# Patient Record
Sex: Female | Born: 1965 | Race: Black or African American | Hispanic: No | Smoking: Never smoker
Health system: Southern US, Community
[De-identification: ages and names within clinical notes are randomized; demographics above are authoritative.]

## PROBLEM LIST (undated history)

## (undated) DIAGNOSIS — G43009 Migraine without aura, not intractable, without status migrainosus: Secondary | ICD-10-CM

## (undated) DIAGNOSIS — F329 Major depressive disorder, single episode, unspecified: Secondary | ICD-10-CM

## (undated) DIAGNOSIS — F988 Other specified behavioral and emotional disorders with onset usually occurring in childhood and adolescence: Secondary | ICD-10-CM

## (undated) DIAGNOSIS — I639 Cerebral infarction, unspecified: Secondary | ICD-10-CM

## (undated) DIAGNOSIS — K219 Gastro-esophageal reflux disease without esophagitis: Secondary | ICD-10-CM

## (undated) DIAGNOSIS — G43909 Migraine, unspecified, not intractable, without status migrainosus: Secondary | ICD-10-CM

## (undated) DIAGNOSIS — F32A Depression, unspecified: Secondary | ICD-10-CM

## (undated) DIAGNOSIS — R51 Headache: Secondary | ICD-10-CM

## (undated) DIAGNOSIS — R0600 Dyspnea, unspecified: Secondary | ICD-10-CM

## (undated) DIAGNOSIS — E876 Hypokalemia: Secondary | ICD-10-CM

## (undated) DIAGNOSIS — K5900 Constipation, unspecified: Secondary | ICD-10-CM

## (undated) DIAGNOSIS — J45909 Unspecified asthma, uncomplicated: Secondary | ICD-10-CM

## (undated) DIAGNOSIS — M1990 Unspecified osteoarthritis, unspecified site: Secondary | ICD-10-CM

## (undated) DIAGNOSIS — I509 Heart failure, unspecified: Secondary | ICD-10-CM

## (undated) DIAGNOSIS — K589 Irritable bowel syndrome without diarrhea: Secondary | ICD-10-CM

## (undated) DIAGNOSIS — R600 Localized edema: Secondary | ICD-10-CM

## (undated) DIAGNOSIS — R1310 Dysphagia, unspecified: Secondary | ICD-10-CM

## (undated) DIAGNOSIS — K760 Fatty (change of) liver, not elsewhere classified: Secondary | ICD-10-CM

## (undated) DIAGNOSIS — F419 Anxiety disorder, unspecified: Secondary | ICD-10-CM

## (undated) DIAGNOSIS — M2550 Pain in unspecified joint: Secondary | ICD-10-CM

## (undated) DIAGNOSIS — Z973 Presence of spectacles and contact lenses: Secondary | ICD-10-CM

## (undated) DIAGNOSIS — I10 Essential (primary) hypertension: Secondary | ICD-10-CM

## (undated) DIAGNOSIS — R519 Headache, unspecified: Secondary | ICD-10-CM

## (undated) DIAGNOSIS — G4730 Sleep apnea, unspecified: Secondary | ICD-10-CM

## (undated) DIAGNOSIS — E0789 Other specified disorders of thyroid: Secondary | ICD-10-CM

## (undated) DIAGNOSIS — Z972 Presence of dental prosthetic device (complete) (partial): Secondary | ICD-10-CM

## (undated) DIAGNOSIS — R633 Feeding difficulties: Secondary | ICD-10-CM

## (undated) DIAGNOSIS — E559 Vitamin D deficiency, unspecified: Secondary | ICD-10-CM

## (undated) DIAGNOSIS — E041 Nontoxic single thyroid nodule: Secondary | ICD-10-CM

## (undated) DIAGNOSIS — E119 Type 2 diabetes mellitus without complications: Secondary | ICD-10-CM

## (undated) DIAGNOSIS — Z9109 Other allergy status, other than to drugs and biological substances: Secondary | ICD-10-CM

## (undated) DIAGNOSIS — N189 Chronic kidney disease, unspecified: Secondary | ICD-10-CM

## (undated) DIAGNOSIS — R5382 Chronic fatigue, unspecified: Secondary | ICD-10-CM

## (undated) DIAGNOSIS — T753XXA Motion sickness, initial encounter: Secondary | ICD-10-CM

## (undated) DIAGNOSIS — G9332 Myalgic encephalomyelitis/chronic fatigue syndrome: Secondary | ICD-10-CM

## (undated) DIAGNOSIS — K0889 Other specified disorders of teeth and supporting structures: Secondary | ICD-10-CM

## (undated) DIAGNOSIS — E039 Hypothyroidism, unspecified: Secondary | ICD-10-CM

## (undated) DIAGNOSIS — M797 Fibromyalgia: Secondary | ICD-10-CM

## (undated) HISTORY — PX: ABDOMINAL HYSTERECTOMY: SHX81

## (undated) HISTORY — DX: Pain in unspecified joint: M25.50

## (undated) HISTORY — DX: Anxiety disorder, unspecified: F41.9

## (undated) HISTORY — DX: Vitamin D deficiency, unspecified: E55.9

## (undated) HISTORY — DX: Chronic fatigue, unspecified: R53.82

## (undated) HISTORY — DX: Unspecified osteoarthritis, unspecified site: M19.90

## (undated) HISTORY — DX: Other specified behavioral and emotional disorders with onset usually occurring in childhood and adolescence: F98.8

## (undated) HISTORY — DX: Fatty (change of) liver, not elsewhere classified: K76.0

## (undated) HISTORY — DX: Myalgic encephalomyelitis/chronic fatigue syndrome: G93.32

## (undated) HISTORY — DX: Irritable bowel syndrome, unspecified: K58.9

## (undated) HISTORY — DX: Hypothyroidism, unspecified: E03.9

## (undated) HISTORY — DX: Other specified disorders of thyroid: E07.89

## (undated) HISTORY — DX: Dyspnea, unspecified: R06.00

## (undated) HISTORY — PX: ABDOMINAL SURGERY: SHX537

## (undated) HISTORY — PX: ECTOPIC PREGNANCY SURGERY: SHX613

## (undated) HISTORY — DX: Other specified disorders of teeth and supporting structures: K08.89

## (undated) HISTORY — PX: HERNIA REPAIR: SHX51

## (undated) HISTORY — DX: Heart failure, unspecified: I50.9

## (undated) HISTORY — DX: Migraine, unspecified, not intractable, without status migrainosus: G43.909

## (undated) HISTORY — DX: Dysphagia, unspecified: R13.10

## (undated) HISTORY — DX: Constipation, unspecified: K59.00

## (undated) HISTORY — DX: Depression, unspecified: F32.A

## (undated) HISTORY — PX: TUBAL LIGATION: SHX77

## (undated) HISTORY — DX: Localized edema: R60.0

---
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
## 1984-09-02 HISTORY — PX: APPENDECTOMY: SHX54

## 2003-01-08 ENCOUNTER — Encounter: Admission: RE | Admit: 2003-01-08 | Discharge: 2003-01-08 | Payer: Self-pay | Admitting: Sports Medicine

## 2003-01-08 ENCOUNTER — Encounter: Payer: Self-pay | Admitting: Sports Medicine

## 2004-01-10 ENCOUNTER — Other Ambulatory Visit: Payer: Self-pay

## 2004-06-02 ENCOUNTER — Encounter: Payer: Self-pay | Admitting: Pain Medicine

## 2004-06-06 ENCOUNTER — Ambulatory Visit: Payer: Self-pay | Admitting: Pain Medicine

## 2004-06-20 ENCOUNTER — Ambulatory Visit: Payer: Self-pay | Admitting: Pain Medicine

## 2004-06-21 ENCOUNTER — Emergency Department: Payer: Self-pay | Admitting: Emergency Medicine

## 2004-07-12 ENCOUNTER — Ambulatory Visit: Payer: Self-pay | Admitting: Pain Medicine

## 2004-08-24 ENCOUNTER — Other Ambulatory Visit: Payer: Self-pay

## 2004-08-24 ENCOUNTER — Inpatient Hospital Stay: Payer: Self-pay | Admitting: Internal Medicine

## 2004-08-25 ENCOUNTER — Other Ambulatory Visit: Payer: Self-pay

## 2004-09-02 HISTORY — PX: KNEE ARTHROSCOPY: SHX127

## 2004-09-02 HISTORY — PX: ROTATOR CUFF REPAIR: SHX139

## 2004-09-13 ENCOUNTER — Ambulatory Visit: Payer: Self-pay | Admitting: Pain Medicine

## 2004-09-18 ENCOUNTER — Emergency Department: Payer: Self-pay | Admitting: Emergency Medicine

## 2004-09-19 ENCOUNTER — Encounter: Payer: Self-pay | Admitting: Pain Medicine

## 2004-10-03 ENCOUNTER — Encounter: Payer: Self-pay | Admitting: Pain Medicine

## 2004-10-11 ENCOUNTER — Ambulatory Visit: Payer: Self-pay | Admitting: Pain Medicine

## 2005-08-19 ENCOUNTER — Emergency Department: Payer: Self-pay | Admitting: Emergency Medicine

## 2005-08-19 ENCOUNTER — Other Ambulatory Visit: Payer: Self-pay

## 2006-01-04 ENCOUNTER — Emergency Department: Payer: Self-pay | Admitting: Internal Medicine

## 2006-04-09 ENCOUNTER — Emergency Department: Payer: Self-pay | Admitting: Emergency Medicine

## 2006-04-13 ENCOUNTER — Emergency Department: Payer: Self-pay | Admitting: Emergency Medicine

## 2008-08-01 ENCOUNTER — Emergency Department: Payer: Self-pay | Admitting: Emergency Medicine

## 2008-08-10 DIAGNOSIS — G43009 Migraine without aura, not intractable, without status migrainosus: Secondary | ICD-10-CM | POA: Insufficient documentation

## 2008-08-10 DIAGNOSIS — E785 Hyperlipidemia, unspecified: Secondary | ICD-10-CM | POA: Insufficient documentation

## 2008-10-26 ENCOUNTER — Emergency Department: Payer: Self-pay | Admitting: Emergency Medicine

## 2009-05-05 ENCOUNTER — Emergency Department: Payer: Self-pay | Admitting: Emergency Medicine

## 2009-05-22 ENCOUNTER — Encounter: Payer: Self-pay | Admitting: Family Medicine

## 2009-05-30 DIAGNOSIS — G4730 Sleep apnea, unspecified: Secondary | ICD-10-CM | POA: Insufficient documentation

## 2009-06-02 ENCOUNTER — Encounter: Payer: Self-pay | Admitting: Family Medicine

## 2009-07-17 ENCOUNTER — Emergency Department: Payer: Self-pay | Admitting: Emergency Medicine

## 2009-08-22 DIAGNOSIS — F329 Major depressive disorder, single episode, unspecified: Secondary | ICD-10-CM | POA: Insufficient documentation

## 2009-10-04 ENCOUNTER — Emergency Department: Payer: Self-pay | Admitting: Emergency Medicine

## 2009-12-29 ENCOUNTER — Emergency Department: Payer: Self-pay | Admitting: Emergency Medicine

## 2010-01-09 ENCOUNTER — Ambulatory Visit: Payer: Self-pay | Admitting: Family Medicine

## 2010-06-01 ENCOUNTER — Emergency Department: Payer: Self-pay | Admitting: Emergency Medicine

## 2010-09-06 ENCOUNTER — Emergency Department: Payer: Self-pay | Admitting: Emergency Medicine

## 2010-10-05 ENCOUNTER — Inpatient Hospital Stay: Payer: Self-pay | Admitting: Internal Medicine

## 2010-12-28 ENCOUNTER — Emergency Department: Payer: Self-pay | Admitting: Emergency Medicine

## 2011-01-21 ENCOUNTER — Emergency Department: Payer: Self-pay | Admitting: Internal Medicine

## 2011-01-22 ENCOUNTER — Ambulatory Visit: Payer: Self-pay | Admitting: Family Medicine

## 2011-02-13 ENCOUNTER — Encounter: Payer: Self-pay | Admitting: Family Medicine

## 2011-03-03 ENCOUNTER — Encounter: Payer: Self-pay | Admitting: Family Medicine

## 2011-04-03 ENCOUNTER — Encounter: Payer: Self-pay | Admitting: Family Medicine

## 2011-05-31 ENCOUNTER — Emergency Department: Payer: Self-pay | Admitting: *Deleted

## 2011-07-11 ENCOUNTER — Emergency Department: Payer: Self-pay | Admitting: *Deleted

## 2011-11-12 ENCOUNTER — Emergency Department: Payer: Self-pay | Admitting: Unknown Physician Specialty

## 2011-11-12 LAB — URINALYSIS, COMPLETE
Bilirubin,UR: NEGATIVE
Blood: NEGATIVE
Glucose,UR: NEGATIVE mg/dL (ref 0–75)
Ketone: NEGATIVE
Nitrite: NEGATIVE
Ph: 7 (ref 4.5–8.0)
Protein: NEGATIVE
RBC,UR: 1 /HPF (ref 0–5)
Specific Gravity: 1.01 (ref 1.003–1.030)
Squamous Epithelial: 4
WBC UR: 4 /HPF (ref 0–5)

## 2011-11-12 LAB — CBC
HCT: 33.5 % — ABNORMAL LOW (ref 35.0–47.0)
HGB: 11.4 g/dL — ABNORMAL LOW (ref 12.0–16.0)
MCH: 30.8 pg (ref 26.0–34.0)
MCHC: 33.9 g/dL (ref 32.0–36.0)
MCV: 91 fL (ref 80–100)
Platelet: 262 10*3/uL (ref 150–440)
RBC: 3.69 10*6/uL — ABNORMAL LOW (ref 3.80–5.20)
RDW: 14.1 % (ref 11.5–14.5)
WBC: 6.3 10*3/uL (ref 3.6–11.0)

## 2011-11-12 LAB — DRUG SCREEN, URINE

## 2011-11-12 LAB — COMPREHENSIVE METABOLIC PANEL
Albumin: 3.2 g/dL — ABNORMAL LOW (ref 3.4–5.0)
Alkaline Phosphatase: 62 U/L (ref 50–136)
Anion Gap: 10 (ref 7–16)
BUN: 14 mg/dL (ref 7–18)
Bilirubin,Total: 0.2 mg/dL (ref 0.2–1.0)
Calcium, Total: 8.5 mg/dL (ref 8.5–10.1)
Chloride: 101 mmol/L (ref 98–107)
Co2: 26 mmol/L (ref 21–32)
Creatinine: 0.71 mg/dL (ref 0.60–1.30)
EGFR (African American): >60
EGFR (Non-African Amer.): >60
Glucose: 151 mg/dL — ABNORMAL HIGH (ref 65–99)
Osmolality: 277 (ref 275–301)
Potassium: 4.9 mmol/L (ref 3.5–5.1)
SGOT(AST): 31 U/L (ref 15–37)
SGPT (ALT): 28 U/L
Sodium: 137 mmol/L (ref 136–145)
Total Protein: 6.9 g/dL (ref 6.4–8.2)

## 2011-11-12 LAB — CK TOTAL AND CKMB (NOT AT ARMC)
CK, Total: 99 U/L (ref 21–215)
CK-MB: <0.5 ng/mL — ABNORMAL LOW (ref 0.5–3.6)

## 2011-11-12 LAB — TROPONIN I: Troponin-I: <0.02 ng/mL

## 2011-11-12 LAB — MAGNESIUM: Magnesium: 1.6 mg/dL — ABNORMAL LOW

## 2012-06-22 DIAGNOSIS — K219 Gastro-esophageal reflux disease without esophagitis: Secondary | ICD-10-CM | POA: Insufficient documentation

## 2012-10-03 ENCOUNTER — Emergency Department: Payer: Self-pay | Admitting: Emergency Medicine

## 2012-10-28 ENCOUNTER — Ambulatory Visit: Payer: Self-pay | Admitting: Orthopedic Surgery

## 2012-12-11 ENCOUNTER — Ambulatory Visit: Payer: Self-pay | Admitting: Unknown Physician Specialty

## 2012-12-16 HISTORY — PX: JOINT REPLACEMENT: SHX530

## 2013-04-30 ENCOUNTER — Emergency Department: Payer: Self-pay | Admitting: Emergency Medicine

## 2013-07-01 DIAGNOSIS — R251 Tremor, unspecified: Secondary | ICD-10-CM | POA: Insufficient documentation

## 2014-05-06 ENCOUNTER — Ambulatory Visit: Payer: Self-pay | Admitting: Family Medicine

## 2014-08-26 ENCOUNTER — Emergency Department: Payer: Self-pay | Admitting: Emergency Medicine

## 2014-09-29 ENCOUNTER — Ambulatory Visit: Payer: Self-pay | Admitting: Anesthesiology

## 2014-09-29 ENCOUNTER — Emergency Department: Payer: Self-pay | Admitting: Emergency Medicine

## 2014-09-29 ENCOUNTER — Ambulatory Visit: Payer: Self-pay | Admitting: Family Medicine

## 2014-10-04 DIAGNOSIS — M25571 Pain in right ankle and joints of right foot: Secondary | ICD-10-CM | POA: Insufficient documentation

## 2014-10-26 ENCOUNTER — Ambulatory Visit: Payer: Self-pay | Admitting: Unknown Physician Specialty

## 2014-11-15 ENCOUNTER — Emergency Department: Payer: Self-pay | Admitting: Emergency Medicine

## 2015-05-28 ENCOUNTER — Emergency Department
Admission: EM | Admit: 2015-05-28 | Discharge: 2015-05-28 | Disposition: A | Discharge status: Home / Self Care | Payer: Medicare Other | Attending: Emergency Medicine | Admitting: Emergency Medicine

## 2015-05-28 ENCOUNTER — Emergency Department: Payer: Medicare Other

## 2015-05-28 ENCOUNTER — Encounter: Payer: Self-pay | Admitting: *Deleted

## 2015-05-28 DIAGNOSIS — Y998 Other external cause status: Secondary | ICD-10-CM | POA: Insufficient documentation

## 2015-05-28 DIAGNOSIS — S8991XA Unspecified injury of right lower leg, initial encounter: Secondary | ICD-10-CM | POA: Insufficient documentation

## 2015-05-28 DIAGNOSIS — Y92511 Restaurant or cafe as the place of occurrence of the external cause: Secondary | ICD-10-CM | POA: Insufficient documentation

## 2015-05-28 DIAGNOSIS — Y9389 Activity, other specified: Secondary | ICD-10-CM | POA: Insufficient documentation

## 2015-05-28 DIAGNOSIS — S79921A Unspecified injury of right thigh, initial encounter: Secondary | ICD-10-CM | POA: Insufficient documentation

## 2015-05-28 DIAGNOSIS — W1839XA Other fall on same level, initial encounter: Secondary | ICD-10-CM | POA: Diagnosis not present

## 2015-05-28 DIAGNOSIS — Z880 Allergy status to penicillin: Secondary | ICD-10-CM | POA: Insufficient documentation

## 2015-05-28 DIAGNOSIS — E119 Type 2 diabetes mellitus without complications: Secondary | ICD-10-CM | POA: Insufficient documentation

## 2015-05-28 DIAGNOSIS — W19XXXA Unspecified fall, initial encounter: Secondary | ICD-10-CM

## 2015-05-28 DIAGNOSIS — M25561 Pain in right knee: Secondary | ICD-10-CM

## 2015-05-28 DIAGNOSIS — R1110 Vomiting, unspecified: Secondary | ICD-10-CM | POA: Insufficient documentation

## 2015-05-28 DIAGNOSIS — M25461 Effusion, right knee: Secondary | ICD-10-CM

## 2015-05-28 DIAGNOSIS — I10 Essential (primary) hypertension: Secondary | ICD-10-CM | POA: Diagnosis not present

## 2015-05-28 HISTORY — DX: Essential (primary) hypertension: I10

## 2015-05-28 HISTORY — DX: Type 2 diabetes mellitus without complications: E11.9

## 2015-05-28 MED ORDER — ONDANSETRON HCL 4 MG PO TABS: 4.0000 mg | ORAL_TABLET | Freq: Three times a day (TID) | ORAL | Status: DC | PRN

## 2015-05-28 MED ORDER — KETOROLAC TROMETHAMINE 30 MG/ML IJ SOLN
60.0000 mg | Freq: Once | INTRAMUSCULAR | Status: AC
  Administered 2015-05-28: 60 mg via INTRAMUSCULAR
  Filled 2015-05-28: qty 2

## 2015-05-28 MED ORDER — OXYCODONE-ACETAMINOPHEN 5-325 MG PO TABS
1.0000 | ORAL_TABLET | Freq: Once | ORAL | Status: AC
  Administered 2015-05-28: 1 via ORAL
  Filled 2015-05-28: qty 1

## 2015-05-28 MED ORDER — ONDANSETRON 4 MG PO TBDP
4.0000 mg | ORAL_TABLET | Freq: Once | ORAL | Status: AC
  Administered 2015-05-28: 4 mg via ORAL
  Filled 2015-05-28: qty 1

## 2015-05-28 MED ORDER — OXYCODONE-ACETAMINOPHEN 5-325 MG PO TABS: 1.0000 | ORAL_TABLET | ORAL | Status: DC | PRN

## 2015-05-28 MED ORDER — IBUPROFEN 800 MG PO TABS: 800.0000 mg | ORAL_TABLET | Freq: Three times a day (TID) | ORAL | Status: DC | PRN

## 2015-05-28 NOTE — ED Provider Notes (Signed)
[facility] Emergency Department Provider Note  ____________________________________________  Time seen: Approximately 4:43 AM  I have reviewed the triage vital signs and the nursing notes.   HISTORY  Chief Complaint Knee Injury    HPI Amanda [name] is a 49 y.o. female who presents to the ED from home s/p mechanical fall at a restaurant last evening approximately 7pm. 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 Patient fell directly onto her right knee. Denies striking head or LOC. Complains of aching and sharp pain throughout her right knee and thigh. States she vomited secondary to pain. Unable to bear weight on her right leg. Denies headache, neck pain, fever, chills, chest pain, shortness of breath, diarrhea, weakness.Nothing makes the pain better. Movement makes the pain worse.   Past Medical History  Diagnosis Date  . Diabetes mellitus without complication   . Hypertension     There are no active problems to display for this patient.   Past Surgical History  Procedure Laterality Date  . Joint replacement Right   . Abdominal surgery    . Abdominal hysterectomy    . Ectopic pregnancy surgery      No current outpatient prescriptions on file.  Allergies Penicillins and Ace inhibitors  No family history on file.  Social History Social History  Substance Use Topics  . Smoking status: Never Smoker   . Smokeless tobacco: Never Used  . Alcohol Use: No    Review of Systems Constitutional: No fever/chills Eyes: No visual changes. ENT: No sore throat. Cardiovascular: Denies chest pain. Respiratory: Denies shortness of breath. Gastrointestinal: No abdominal pain.  No nausea, no vomiting.  No diarrhea.  No constipation. Genitourinary: Negative for dysuria. Musculoskeletal: Positive for right knee pain and swelling. Negative for back pain. Skin: Negative for rash. Neurological: Negative for headaches, focal  weakness or numbness.  10-point ROS otherwise negative.  ____________________________________________   PHYSICAL EXAM:  VITAL SIGNS: ED Triage Vitals  Enc Vitals Group     BP 05/28/15 0056 143/93 mmHg     Pulse Rate 05/28/15 0056 65     Resp 05/28/15 0056 18     Temp 05/28/15 0056 98.2 F (36.8 C)     Temp Source 05/28/15 0056 Oral     SpO2 05/28/15 0056 94 %     Weight 05/28/15 0056 188 lb (85.276 kg)     Height 05/28/15 0056  (1.651 m)     Head Cir --      Peak Flow --      Pain Score 05/28/15 0057 8     Pain Loc --      Pain Edu? --      Excl. in GC? --     Constitutional: Alert and oriented. Well appearing and in no acute distress. Eyes: Conjunctivae are normal. PERRL. EOMI. Head: Atraumatic. Nose: No congestion/rhinnorhea. Mouth/Throat: Mucous membranes are moist.  Oropharynx non-erythematous. Neck: No stridor. No cervical spine tenderness to palpation. Cardiovascular: Normal rate, regular rhythm. Grossly normal heart sounds.  Good peripheral circulation. Respiratory: Normal respiratory effort.  No retractions. Lungs CTAB. Gastrointestinal: Soft and nontender. No distention. No abdominal bruits. No CVA tenderness. Musculoskeletal: Right knee moderately swollen, tender to palpation. Limited range of motion secondary to pain. 2+ distal pulses. Calf is supple without evidence for compartment syndrome. Limb is symmetrically warm without evidence for ischemia. Neurologic:  Normal speech and language. No gross focal neurologic deficits are appreciated.  Skin:  Skin is warm, dry  and intact. No rash noted. Psychiatric: Mood and affect are normal. Speech and behavior are normal.  ____________________________________________   LABS (all labs ordered are listed, but only abnormal results are displayed)  Labs Reviewed - No data to display ____________________________________________  EKG  None ____________________________________________  RADIOLOGY  Right knee  x-rays (viewed by me, interpreted per Dr. Andria Meuse): Right medial compartment partial knee arthroplasty. Components appear well seated. No acute bony abnormalities.  ____________________________________________   PROCEDURES  Procedure(s) performed: None  Critical Care performed: No  ____________________________________________   INITIAL IMPRESSION / ASSESSMENT AND PLAN / ED COURSE  Pertinent labs & imaging results that were available during my care of the patient were reviewed by me and considered in my medical decision making (see chart for details).  49 year old female s/p mechanical fall onto right knee. X-rays negative for dislocation or fracture. Will treat with NSAID's, analgesia, knee immobilizer, crutches and orthopedics follow-up. Strict return precautions given. Patient verbalizes understanding and agrees with plan of care. ____________________________________________   FINAL CLINICAL IMPRESSION(S) / ED DIAGNOSES  Final diagnoses:  Fall, initial encounter  Knee pain, acute, right  Knee swelling, right      Irean Hong, MD 05/28/15 [phone]

## 2015-05-28 NOTE — ED Notes (Signed)
Pt presents w/ c/o R knee pain, swelling, and decreased mobility after a fall at a restaurant last night. Pt states her entire R thigh and R knee are hurting. Pt states she has been vomiting and believes it is secondary to the pain from her knee. Pt states she is unable to bear weight on R leg.

## 2015-05-28 NOTE — Discharge Instructions (Signed)
1. Take medicines as needed for pain and nausea (Motrin/Percocet/Zofran #15). 2. Wear knee immobilizer and use crutches to walk. 3. Elevate affected area and apply ice several times daily. 4. Return to the ER for worsening symptoms, increased swelling, leg weakness or other concerns.  Fall Prevention and Home Safety Falls cause injuries and can affect all age groups. It is possible to use preventive measures to significantly decrease the likelihood of falls. There are many simple measures which can make your home safer and prevent falls. OUTDOORS  Repair cracks and edges of walkways and driveways.  Remove high doorway thresholds.  Trim shrubbery on the main path into your home.  Have good outside lighting.  Clear walkways of tools, rocks, debris, and clutter.  Check that handrails are not broken and are securely fastened. Both sides of steps should have handrails.  Have leaves, snow, and ice cleared regularly.  Use sand or salt on walkways during winter months.  In the garage, clean up grease or oil spills. BATHROOM  Install night lights.  Install grab bars by the toilet and in the tub and shower.  Use non-skid mats or decals in the tub or shower.  Place a plastic non-slip stool in the shower to sit on, if needed.  Keep floors dry and clean up all water on the floor immediately.  Remove soap buildup in the tub or shower on a regular basis.  Secure bath mats with non-slip, double-sided rug tape.  Remove throw rugs and tripping hazards from the floors. BEDROOMS  Install night lights.  Make sure a bedside light is easy to reach.  Do not use oversized bedding.  Keep a telephone by your bedside.  Have a firm chair with side arms to use for getting dressed.  Remove throw rugs and tripping hazards from the floor. KITCHEN  Keep handles on pots and pans turned toward the center of the stove. Use back burners when possible.  Clean up spills quickly and allow time for  drying.  Avoid walking on wet floors.  Avoid hot utensils and knives.  Position shelves so they are not too high or low.  Place commonly used objects within easy reach.  If necessary, use a sturdy step stool with a grab bar when reaching.  Keep electrical cables out of the way.  Do not use floor polish or wax that makes floors slippery. If you must use wax, use non-skid floor wax.  Remove throw rugs and tripping hazards from the floor. STAIRWAYS  Never leave objects on stairs.  Place handrails on both sides of stairways and use them. Fix any loose handrails. Make sure handrails on both sides of the stairways are as long as the stairs.  Check carpeting to make sure it is firmly attached along stairs. Make repairs to worn or loose carpet promptly.  Avoid placing throw rugs at the top or bottom of stairways, or properly secure the rug with carpet tape to prevent slippage. Get rid of throw rugs, if possible.  Have an electrician put in a light switch at the top and bottom of the stairs. OTHER FALL PREVENTION TIPS  Wear low-heel or rubber-soled shoes that are supportive and fit well. Wear closed toe shoes.  When using a stepladder, make sure it is fully opened and both spreaders are firmly locked. Do not climb a closed stepladder.  Add color or contrast paint or tape to grab bars and handrails in your home. Place contrasting color strips on first and last steps.  Learn and use mobility aids as needed. Install an electrical emergency response system.  Turn on lights to avoid dark areas. Replace light bulbs that burn out immediately. Get light switches that glow.  Arrange furniture to create clear pathways. Keep furniture in the same place.  Firmly attach carpet with non-skid or double-sided tape.  Eliminate uneven floor surfaces.  Select a carpet pattern that does not visually hide the edge of steps.  Be aware of all pets. OTHER HOME SAFETY TIPS  Set the water temperature  for 120 F (48.8 C).  Keep emergency numbers on or near the telephone.  Keep smoke detectors on every level of the home and near sleeping areas. Document Released: 08/09/2002 Document Revised: 02/18/2012 Document Reviewed: 11/08/2011 [facility] Patient Information 2015 [place], [place]. This information is not intended to replace advice given to you by your health care provider. Make sure you discuss any questions you have with your health care provider.  Knee Pain The knee is the complex joint between your thigh and your lower leg. It is made up of bones, tendons, ligaments, and cartilage. The bones that make up the knee are:  The femur in the thigh.  The tibia and fibula in the lower leg.  The patella or kneecap riding in the groove on the lower femur. CAUSES  Knee pain is a common complaint with many causes. A few of these causes are:  Injury, such as:  A ruptured ligament or tendon injury.  Torn cartilage.  Medical conditions, such as:  Gout  Arthritis  Infections  Overuse, over training, or overdoing a physical activity. Knee pain can be minor or severe. Knee pain can accompany debilitating injury. Minor knee problems often respond well to self-care measures or get well on their own. More serious injuries may need medical intervention or even surgery. SYMPTOMS The knee is complex. Symptoms of knee problems can vary widely. Some of the problems are:  Pain with movement and weight bearing.  Swelling and tenderness.  Buckling of the knee.  Inability to straighten or extend your knee.  Your knee locks and you cannot straighten it.  Warmth and redness with pain and fever.  Deformity or dislocation of the kneecap. DIAGNOSIS  Determining what is wrong may be very straight forward such as when there is an injury. It can also be challenging because of the complexity of the knee. Tests to make a diagnosis may include:  Your caregiver taking a history and doing a  physical exam.  Routine X-rays can be used to rule out other problems. X-rays will not reveal a cartilage tear. Some injuries of the knee can be diagnosed by:  Arthroscopy a surgical technique by which a small video camera is inserted through tiny incisions on the sides of the knee. This procedure is used to examine and repair internal knee joint problems. Tiny instruments can be used during arthroscopy to repair the torn knee cartilage (meniscus).  Arthrography is a radiology technique. A contrast liquid is directly injected into the knee joint. Internal structures of the knee joint then become visible on X-ray film.  An MRI scan is a non X-ray radiology procedure in which magnetic fields and a computer produce two- or three-dimensional images of the inside of the knee. Cartilage tears are often visible using an MRI scanner. MRI scans have largely replaced arthrography in diagnosing cartilage tears of the knee.  Blood work.  Examination of the fluid that helps to lubricate the knee joint (synovial fluid). This is done  by taking a sample out using a needle and a syringe. TREATMENT The treatment of knee problems depends on the cause. Some of these treatments are:  Depending on the injury, proper casting, splinting, surgery, or physical therapy care will be needed.  Give yourself adequate recovery time. Do not overuse your joints. If you begin to get sore during workout routines, back off. Slow down or do fewer repetitions.  For repetitive activities such as cycling or running, maintain your strength and nutrition.  Alternate muscle groups. For example, if you are a weight lifter, work the upper body on one day and the lower body the next.  Either tight or weak muscles do not give the proper support for your knee. Tight or weak muscles do not absorb the stress placed on the knee joint. Keep the muscles surrounding the knee strong.  Take care of mechanical problems.  If you have flat feet,  orthotics or special shoes may help. See your caregiver if you need help.  Arch supports, sometimes with wedges on the inner or outer aspect of the heel, can help. These can shift pressure away from the side of the knee most bothered by osteoarthritis.  A brace called an "unloader" brace also may be used to help ease the pressure on the most arthritic side of the knee.  If your caregiver has prescribed crutches, braces, wraps or ice, use as directed. The acronym for this is PRICE. This means protection, rest, ice, compression, and elevation.  Nonsteroidal anti-inflammatory drugs (NSAIDs), can help relieve pain. But if taken immediately after an injury, they may actually increase swelling. Take NSAIDs with food in your stomach. Stop them if you develop stomach problems. Do not take these if you have a history of ulcers, stomach pain, or bleeding from the bowel. Do not take without your caregiver's approval if you have problems with fluid retention, heart failure, or kidney problems.  For ongoing knee problems, physical therapy may be helpful.  Glucosamine and chondroitin are over-the-counter dietary supplements. Both may help relieve the pain of osteoarthritis in the knee. These medicines are different from the usual anti-inflammatory drugs. Glucosamine may decrease the rate of cartilage destruction.  Injections of a corticosteroid drug into your knee joint may help reduce the symptoms of an arthritis flare-up. They may provide pain relief that lasts a few months. You may have to wait a few months between injections. The injections do have a small increased risk of infection, water retention, and elevated blood sugar levels.  Hyaluronic acid injected into damaged joints may ease pain and provide lubrication. These injections may work by reducing inflammation. A series of shots may give relief for as long as 6 months.  Topical painkillers. Applying certain ointments to your skin may help relieve the  pain and stiffness of osteoarthritis. Ask your pharmacist for suggestions. Many over the-counter products are approved for temporary relief of arthritis pain.  In some countries, doctors often prescribe topical NSAIDs for relief of chronic conditions such as arthritis and tendinitis. A review of treatment with NSAID creams found that they worked as well as oral medications but without the serious side effects. PREVENTION  Maintain a healthy weight. Extra pounds put more strain on your joints.  Get strong, stay limber. Weak muscles are a common cause of knee injuries. Stretching is important. Include flexibility exercises in your workouts.  Be smart about exercise. If you have osteoarthritis, chronic knee pain or recurring injuries, you may need to change the way you exercise.  This does not mean you have to stop being active. If your knees ache after jogging or playing basketball, consider switching to swimming, water aerobics, or other low-impact activities, at least for a few days a week. Sometimes limiting high-impact activities will provide relief.  Make sure your shoes fit well. Choose footwear that is right for your sport.  Protect your knees. Use the proper gear for knee-sensitive activities. Use kneepads when playing volleyball or laying carpet. Buckle your seat belt every time you drive. Most shattered kneecaps occur in car accidents.  Rest when you are tired. SEEK MEDICAL CARE IF:  You have knee pain that is continual and does not seem to be getting better.  SEEK IMMEDIATE MEDICAL CARE IF:  Your knee joint feels hot to the touch and you have a high fever. MAKE SURE YOU:   Understand these instructions.  Will watch your condition.  Will get help right away if you are not doing well or get worse. Document Released: 06/16/2007 Document Revised: 11/11/2011 Document Reviewed: 06/16/2007 [facility] Patient Information 2015 [place], [place]. This information is not intended to replace  advice given to you by your health care provider. Make sure you discuss any questions you have with your health care provider.  Knee Effusion  Knee effusion means you have fluid in your knee. The knee may be more difficult to bend and move. HOME CARE  Use crutches or a brace as told by your doctor.  Put ice on the injured area.  Put ice in a plastic bag.  Place a towel between your skin and the bag.  Leave the ice on for 15-20 minutes, 03-04 times a day.  Raise (elevate) your knee as much as possible.  Only take medicine as told by your doctor.  You may need to do strengthening exercises. Ask your doctor.  Continue with your normal diet and activities as told by your doctor. GET HELP RIGHT AWAY IF:  You have more puffiness (swelling) in your knee.  You see redness, puffiness, or have more pain in your knee.  You have a temperature by mouth above 102 F (38.9 C).  You get a rash.  You have trouble breathing.  You have a reaction to any medicine you are taking.  You have a lot of pain when you move your knee. MAKE SURE YOU:  Understand these instructions.  Will watch your condition.  Will get help right away if you are not doing well or get worse. Document Released: 09/21/2010 Document Revised: 11/11/2011 Document Reviewed: 09/21/2010 [facility] Patient Information 2015 [place], [place]. This information is not intended to replace advice given to you by your health care provider. Make sure you discuss any questions you have with your health care provider.

## 2015-06-30 ENCOUNTER — Other Ambulatory Visit: Payer: Self-pay | Admitting: Unknown Physician Specialty

## 2015-06-30 DIAGNOSIS — M25561 Pain in right knee: Secondary | ICD-10-CM

## 2015-07-01 ENCOUNTER — Ambulatory Visit
Admission: RE | Admit: 2015-07-01 | Discharge: 2015-07-01 | Disposition: A | Discharge status: Home / Self Care | Payer: Medicare Other | Source: Ambulatory Visit | Attending: Unknown Physician Specialty | Admitting: Unknown Physician Specialty

## 2015-07-01 DIAGNOSIS — Z9889 Other specified postprocedural states: Secondary | ICD-10-CM | POA: Diagnosis not present

## 2015-07-01 DIAGNOSIS — S8991XA Unspecified injury of right lower leg, initial encounter: Secondary | ICD-10-CM | POA: Insufficient documentation

## 2015-07-01 DIAGNOSIS — M1711 Unilateral primary osteoarthritis, right knee: Secondary | ICD-10-CM | POA: Diagnosis not present

## 2015-07-01 DIAGNOSIS — M25561 Pain in right knee: Secondary | ICD-10-CM

## 2015-07-01 DIAGNOSIS — W19XXXA Unspecified fall, initial encounter: Secondary | ICD-10-CM | POA: Diagnosis not present

## 2015-07-29 ENCOUNTER — Encounter: Payer: Self-pay | Admitting: Emergency Medicine

## 2015-07-29 ENCOUNTER — Emergency Department: Payer: Medicare Other

## 2015-07-29 DIAGNOSIS — Z880 Allergy status to penicillin: Secondary | ICD-10-CM | POA: Diagnosis not present

## 2015-07-29 DIAGNOSIS — J209 Acute bronchitis, unspecified: Secondary | ICD-10-CM | POA: Insufficient documentation

## 2015-07-29 DIAGNOSIS — E119 Type 2 diabetes mellitus without complications: Secondary | ICD-10-CM | POA: Insufficient documentation

## 2015-07-29 DIAGNOSIS — Z7984 Long term (current) use of oral hypoglycemic drugs: Secondary | ICD-10-CM | POA: Diagnosis not present

## 2015-07-29 DIAGNOSIS — Z794 Long term (current) use of insulin: Secondary | ICD-10-CM | POA: Diagnosis not present

## 2015-07-29 DIAGNOSIS — R062 Wheezing: Secondary | ICD-10-CM | POA: Diagnosis present

## 2015-07-29 DIAGNOSIS — I10 Essential (primary) hypertension: Secondary | ICD-10-CM | POA: Diagnosis not present

## 2015-07-29 DIAGNOSIS — Z79899 Other long term (current) drug therapy: Secondary | ICD-10-CM | POA: Diagnosis not present

## 2015-07-29 NOTE — ED Notes (Signed)
Pt states wheezing and coughing up green mucous. Pt complains of fever and chills. Pt states she called EMS and they gave her a breathing treatment at home.

## 2015-07-30 ENCOUNTER — Emergency Department
Admission: EM | Admit: 2015-07-30 | Discharge: 2015-07-30 | Disposition: A | Discharge status: Home / Self Care | Payer: Medicare Other | Attending: Emergency Medicine | Admitting: Emergency Medicine

## 2015-07-30 DIAGNOSIS — J40 Bronchitis, not specified as acute or chronic: Secondary | ICD-10-CM

## 2015-07-30 DIAGNOSIS — J209 Acute bronchitis, unspecified: Secondary | ICD-10-CM | POA: Diagnosis not present

## 2015-07-30 MED ORDER — HYDROCOD POLST-CPM POLST ER 10-8 MG/5ML PO SUER: 5.0000 mL | Freq: Two times a day (BID) | ORAL | Status: DC

## 2015-07-30 MED ORDER — ALBUTEROL SULFATE HFA 108 (90 BASE) MCG/ACT IN AERS: 2.0000 | INHALATION_SPRAY | RESPIRATORY_TRACT | Status: DC | PRN

## 2015-07-30 MED ORDER — ONDANSETRON 4 MG PO TBDP
4.0000 mg | ORAL_TABLET | Freq: Once | ORAL | Status: AC
  Administered 2015-07-30: 4 mg via ORAL

## 2015-07-30 MED ORDER — IPRATROPIUM-ALBUTEROL 0.5-2.5 (3) MG/3ML IN SOLN
3.0000 mL | Freq: Once | RESPIRATORY_TRACT | Status: AC
  Administered 2015-07-30: 3 mL via RESPIRATORY_TRACT
  Filled 2015-07-30: qty 3

## 2015-07-30 MED ORDER — AZITHROMYCIN 250 MG PO TABS: 250.0000 mg | ORAL_TABLET | Freq: Every day | ORAL | Status: DC

## 2015-07-30 MED ORDER — AZITHROMYCIN 250 MG PO TABS
500.0000 mg | ORAL_TABLET | Freq: Once | ORAL | Status: AC
  Administered 2015-07-30: 500 mg via ORAL
  Filled 2015-07-30: qty 2

## 2015-07-30 MED ORDER — ONDANSETRON HCL 4 MG PO TABS: 4.0000 mg | ORAL_TABLET | Freq: Three times a day (TID) | ORAL | Status: DC | PRN

## 2015-07-30 MED ORDER — HYDROCOD POLST-CPM POLST ER 10-8 MG/5ML PO SUER
5.0000 mL | Freq: Once | ORAL | Status: AC
  Administered 2015-07-30: 5 mL via ORAL
  Filled 2015-07-30: qty 5

## 2015-07-30 MED ORDER — ONDANSETRON 4 MG PO TBDP
ORAL_TABLET | ORAL | Status: AC
  Administered 2015-07-30: 4 mg via ORAL
  Filled 2015-07-30: qty 1

## 2015-07-30 MED ORDER — PREDNISONE 20 MG PO TABS
60.0000 mg | ORAL_TABLET | Freq: Once | ORAL | Status: AC
  Administered 2015-07-30: 60 mg via ORAL
  Filled 2015-07-30: qty 3

## 2015-07-30 MED ORDER — PREDNISONE 20 MG PO TABS: ORAL_TABLET | ORAL | Status: DC

## 2015-07-30 NOTE — ED Notes (Signed)
Pt reports cough x 4 days and fever.  Pt reports fever up to 101 but taking tylenol consistently q3-4hr, last dose 4hr ago.  Pt reports cough productive with green sputum.  Pt reports chest and back pain, described as sore, aching, and occasionally sharp.  Pt NAD at this time, respirations equal and unlabored, skin warm and dry.

## 2015-07-30 NOTE — Discharge Instructions (Signed)
1. Finish antibiotic as prescribed (azithromycin 250 mg daily 4 days). 2. Use albuterol inhaler 2 puffs every 4 hours as needed for wheezing. 3. Finish steroid as prescribed (prednisone 60 mg daily 4 days). This will temporarily increase your blood sugar but right now your lungs needed to reduce inflammation which is causing your wheezing. 4. Take cough medicine as needed (Tussionex). 5. Return to the ER for worsening symptoms, persistent vomiting, difficulty breathing or other concerns.

## 2015-07-30 NOTE — ED Provider Notes (Signed)
[facility] Emergency Department Provider Note  ____________________________________________  Time seen: Approximately 2:09 AM  I have reviewed the triage vital signs and the nursing notes.   HISTORY  Chief Complaint Wheezing and Fever    HPI Amanda [name] is a 49 y.o. female who presents to the ED from home with a chief complaint of cough, fever and myalgias.Patient reports cough productive of green sputum 4 days. Reports associated fever up to 101F which is improved with Tylenol. Describes aching muscles, chest and back. History of mild, intermittent asthma who has not had a nebulizer machine for the past 3 years. Denies recent travel or trauma. Denies abdominal pain, nausea, vomiting, diarrhea. The makes her symptoms worse. Nebulizer treatment given per EMS made her symptoms better.   Past Medical History  Diagnosis Date  . Diabetes mellitus without complication (HCC)   . Hypertension     There are no active problems to display for this patient.   Past Surgical History  Procedure Laterality Date  . Joint replacement Right   . Abdominal surgery    . Abdominal hysterectomy    . Ectopic pregnancy surgery      Current Outpatient Rx  Name  Route  Sig  Dispense  Refill  . canagliflozin (INVOKANA) 300 MG TABS tablet   Oral   Take 300 mg by mouth daily before breakfast.         . insulin detemir (LEVEMIR) 100 UNIT/ML injection   Subcutaneous   Inject 10 Units into the skin at bedtime.         . metFORMIN (GLUCOPHAGE) 500 MG tablet   Oral   Take by mouth 2 (two) times daily with a meal.         . pregabalin (LYRICA) 200 MG capsule   Oral   Take 200 mg by mouth 2 (two) times daily.         [name] Kitchen zolpidem (AMBIEN) 10 MG tablet   Oral   Take 10 mg by mouth at bedtime as needed for sleep.         [name] Kitchen ibuprofen (ADVIL,MOTRIN) 800 MG tablet   Oral   Take 1 tablet (800 mg total) by mouth every 8 (eight) hours as needed for moderate  pain.   15 tablet   0   . ondansetron (ZOFRAN) 4 MG tablet   Oral   Take 1 tablet (4 mg total) by mouth every 8 (eight) hours as needed for nausea or vomiting.   15 tablet   0   . oxyCODONE-acetaminophen (ROXICET) 5-325 MG per tablet   Oral   Take 1 tablet by mouth every 4 (four) hours as needed for severe pain.   15 tablet   0     Allergies Penicillins and Ace inhibitors  History reviewed. No pertinent family history.  Social History Social History  Substance Use Topics  . Smoking status: Never Smoker   . Smokeless tobacco: Never Used  . Alcohol Use: No    Review of Systems Constitutional: Positive for fever/chills. Positive for generalized myalgias. Eyes: No visual changes. ENT: No sore throat. Cardiovascular: Denies chest pain. Respiratory: Positive for productive cough and wheezing. Denies shortness of breath. Gastrointestinal: No abdominal pain.  No nausea, no vomiting.  No diarrhea.  No constipation. Genitourinary: Negative for dysuria. Musculoskeletal: Negative for back pain. Skin: Negative for rash. Neurological: Negative for headaches, focal weakness or numbness.  10-point ROS otherwise negative.  ____________________________________________   PHYSICAL EXAM:  VITAL SIGNS: ED Triage  Vitals  Enc Vitals Group     BP 07/29/15 2325 119/67 mmHg     Pulse Rate 07/29/15 2325 72     Resp 07/29/15 2325 16     Temp 07/29/15 2325 97.6 F (36.4 C)     Temp Source 07/29/15 2325 Oral     SpO2 07/29/15 2325 96 %     Weight 07/29/15 2325 190 lb (86.183 kg)     Height 07/29/15 2325 5\' 5"  (1.651 m)     Head Cir --      Peak Flow --      Pain Score 07/29/15 2332 8     Pain Loc --      Pain Edu? --      Excl. in GC? --     Constitutional: Alert and oriented. Well appearing and in mild acute distress. Eyes: Conjunctivae are normal. PERRL. EOMI. Head: Atraumatic. Nose: Congestion/rhinnorhea. Mouth/Throat: Mucous membranes are moist.  Oropharynx  non-erythematous. Neck: No stridor.   Hematological/Lymphatic/Immunilogical: No cervical lymphadenopathy. Cardiovascular: Normal rate, regular rhythm. Grossly normal heart sounds.  Good peripheral circulation. Respiratory: Normal respiratory effort.  No retractions. Lungs with expiratory wheezing left lower lobe. Gastrointestinal: Soft and nontender. No distention. No abdominal bruits. No CVA tenderness. Musculoskeletal: No lower extremity tenderness nor edema.  No joint effusions. Neurologic:  Normal speech and language. No gross focal neurologic deficits are appreciated. No gait instability. Skin:  Skin is warm, dry and intact. No rash noted. Psychiatric: Mood and affect are normal. Speech and behavior are normal.  ____________________________________________   LABS (all labs ordered are listed, but only abnormal results are displayed)  Labs Reviewed - No data to display ____________________________________________  EKG  ED ECG REPORT I, Marah Park J, the attending physician, personally viewed and interpreted this ECG.   Date: 07/30/2015  EKG Time: 2341  Rate: 71  Rhythm: normal EKG, normal sinus rhythm  Axis: Normal  Intervals:none  ST&T Change: Nonspecific  ____________________________________________  RADIOLOGY  Chest 2 view (view by me, interpreted per Dr. Cherly Hensenhang): No acute cardiopulmonary process seen. ____________________________________________   PROCEDURES  Procedure(s) performed: None  Critical Care performed: No  ____________________________________________   INITIAL IMPRESSION / ASSESSMENT AND PLAN / ED COURSE  Pertinent labs & imaging results that were available during my care of the patient were reviewed by me and considered in my medical decision making (see chart for details).  49 year old female who presents with acute bronchitis. Wheezing noted on exam. Will administer DuoNeb, initiate prednisone, Tussionex for cough and  azithromycin.  ----------------------------------------- 3:25 AM on 07/30/2015 -----------------------------------------  Wheezing resolved. Room air saturations 95%. Patient feels overall better. Strict return precautions given. Patient verbalizes understanding and agrees with plan of care.  ----------------------------------------- 4:02 AM on 07/30/2015 -----------------------------------------  Addendum - failed to give patient prescription for albuterol inhaler. She called inquiring about this and it was called into her [facility] which [facility]. ____________________________________________   FINAL CLINICAL IMPRESSION(S) / ED DIAGNOSES  Final diagnoses:  Bronchitis      Irean HongJade J Eyleen Rawlinson, MD 07/30/15 [phone]

## 2015-09-29 ENCOUNTER — Encounter: Payer: Self-pay | Admitting: *Deleted

## 2015-10-06 ENCOUNTER — Encounter
Admission: RE | Disposition: A | Discharge status: Home / Self Care | Payer: Self-pay | Source: Ambulatory Visit | Attending: Unknown Physician Specialty

## 2015-10-06 ENCOUNTER — Ambulatory Visit: Payer: Medicare Other | Admitting: Anesthesiology

## 2015-10-06 ENCOUNTER — Ambulatory Visit
Admission: RE | Admit: 2015-10-06 | Discharge: 2015-10-06 | Disposition: A | Discharge status: Home / Self Care | Payer: Medicare Other | Source: Ambulatory Visit | Attending: Unknown Physician Specialty | Admitting: Unknown Physician Specialty

## 2015-10-06 DIAGNOSIS — Z833 Family history of diabetes mellitus: Secondary | ICD-10-CM | POA: Diagnosis not present

## 2015-10-06 DIAGNOSIS — Z96651 Presence of right artificial knee joint: Secondary | ICD-10-CM | POA: Diagnosis not present

## 2015-10-06 DIAGNOSIS — M65861 Other synovitis and tenosynovitis, right lower leg: Secondary | ICD-10-CM | POA: Insufficient documentation

## 2015-10-06 DIAGNOSIS — E119 Type 2 diabetes mellitus without complications: Secondary | ICD-10-CM | POA: Diagnosis not present

## 2015-10-06 DIAGNOSIS — Z79899 Other long term (current) drug therapy: Secondary | ICD-10-CM | POA: Insufficient documentation

## 2015-10-06 DIAGNOSIS — M25561 Pain in right knee: Secondary | ICD-10-CM | POA: Insufficient documentation

## 2015-10-06 DIAGNOSIS — K219 Gastro-esophageal reflux disease without esophagitis: Secondary | ICD-10-CM | POA: Insufficient documentation

## 2015-10-06 DIAGNOSIS — Z8249 Family history of ischemic heart disease and other diseases of the circulatory system: Secondary | ICD-10-CM | POA: Diagnosis not present

## 2015-10-06 DIAGNOSIS — R51 Headache: Secondary | ICD-10-CM | POA: Diagnosis not present

## 2015-10-06 DIAGNOSIS — G43909 Migraine, unspecified, not intractable, without status migrainosus: Secondary | ICD-10-CM | POA: Diagnosis not present

## 2015-10-06 DIAGNOSIS — I10 Essential (primary) hypertension: Secondary | ICD-10-CM | POA: Diagnosis not present

## 2015-10-06 DIAGNOSIS — Z880 Allergy status to penicillin: Secondary | ICD-10-CM | POA: Insufficient documentation

## 2015-10-06 DIAGNOSIS — M797 Fibromyalgia: Secondary | ICD-10-CM | POA: Diagnosis not present

## 2015-10-06 DIAGNOSIS — E079 Disorder of thyroid, unspecified: Secondary | ICD-10-CM | POA: Diagnosis not present

## 2015-10-06 DIAGNOSIS — Z888 Allergy status to other drugs, medicaments and biological substances status: Secondary | ICD-10-CM | POA: Insufficient documentation

## 2015-10-06 DIAGNOSIS — Z90710 Acquired absence of both cervix and uterus: Secondary | ICD-10-CM | POA: Insufficient documentation

## 2015-10-06 DIAGNOSIS — Z8489 Family history of other specified conditions: Secondary | ICD-10-CM | POA: Insufficient documentation

## 2015-10-06 DIAGNOSIS — Z7984 Long term (current) use of oral hypoglycemic drugs: Secondary | ICD-10-CM | POA: Diagnosis not present

## 2015-10-06 DIAGNOSIS — J45909 Unspecified asthma, uncomplicated: Secondary | ICD-10-CM | POA: Insufficient documentation

## 2015-10-06 HISTORY — DX: Presence of spectacles and contact lenses: Z97.3

## 2015-10-06 HISTORY — DX: Fibromyalgia: M79.7

## 2015-10-06 HISTORY — DX: Headache, unspecified: R51.9

## 2015-10-06 HISTORY — DX: Unspecified osteoarthritis, unspecified site: M19.90

## 2015-10-06 HISTORY — DX: Headache: R51

## 2015-10-06 HISTORY — DX: Nontoxic single thyroid nodule: E04.1

## 2015-10-06 HISTORY — DX: Gastro-esophageal reflux disease without esophagitis: K21.9

## 2015-10-06 HISTORY — PX: KNEE ARTHROSCOPY: SHX127

## 2015-10-06 HISTORY — DX: Unspecified asthma, uncomplicated: J45.909

## 2015-10-06 HISTORY — DX: Motion sickness, initial encounter: T75.3XXA

## 2015-10-06 HISTORY — DX: Presence of dental prosthetic device (complete) (partial): Z97.2

## 2015-10-06 LAB — GLUCOSE, CAPILLARY
Glucose-Capillary: 96 mg/dL (ref 65–99)
Glucose-Capillary: 97 mg/dL (ref 65–99)

## 2015-10-06 SURGERY — ARTHROSCOPY, KNEE
Anesthesia: General | Site: Knee | Laterality: Right | Wound class: Clean

## 2015-10-06 MED ORDER — GLYCOPYRROLATE 0.2 MG/ML IJ SOLN
INTRAMUSCULAR | Status: DC | PRN
  Administered 2015-10-06: 0.1 mg via INTRAVENOUS

## 2015-10-06 MED ORDER — LIDOCAINE HCL (CARDIAC) 20 MG/ML IV SOLN
INTRAVENOUS | Status: DC | PRN
  Administered 2015-10-06: 50 mg via INTRATRACHEAL

## 2015-10-06 MED ORDER — SCOPOLAMINE 1 MG/3DAYS TD PT72
1.0000 | MEDICATED_PATCH | TRANSDERMAL | Status: DC
  Administered 2015-10-06: 1.5 mg via TRANSDERMAL

## 2015-10-06 MED ORDER — HYDROMORPHONE HCL 1 MG/ML IJ SOLN
0.2500 mg | INTRAMUSCULAR | Status: DC | PRN
  Administered 2015-10-06: 0.4 mg via INTRAVENOUS
  Administered 2015-10-06: 0.2 mg via INTRAVENOUS
  Administered 2015-10-06: 0.4 mg via INTRAVENOUS

## 2015-10-06 MED ORDER — ONDANSETRON HCL 4 MG/2ML IJ SOLN
INTRAMUSCULAR | Status: DC | PRN
  Administered 2015-10-06: 4 mg via INTRAVENOUS

## 2015-10-06 MED ORDER — OXYCODONE HCL 5 MG PO TABS
5.0000 mg | ORAL_TABLET | Freq: Once | ORAL | Status: AC | PRN
  Administered 2015-10-06: 5 mg via ORAL

## 2015-10-06 MED ORDER — OXYCODONE HCL 5 MG/5ML PO SOLN: 5.0000 mg | Freq: Once | ORAL | Status: AC | PRN

## 2015-10-06 MED ORDER — FENTANYL CITRATE (PF) 100 MCG/2ML IJ SOLN
INTRAMUSCULAR | Status: DC | PRN
  Administered 2015-10-06 (×4): 25 ug via INTRAVENOUS

## 2015-10-06 MED ORDER — LACTATED RINGERS IV SOLN
INTRAVENOUS | Status: DC
  Administered 2015-10-06: 07:00:00 via INTRAVENOUS

## 2015-10-06 MED ORDER — BUPIVACAINE HCL (PF) 0.5 % IJ SOLN
INTRAMUSCULAR | Status: DC | PRN
  Administered 2015-10-06: 5 mL

## 2015-10-06 MED ORDER — CLINDAMYCIN PHOSPHATE 600 MG/50ML IV SOLN
INTRAVENOUS | Status: DC | PRN
  Administered 2015-10-06: 600 mg via INTRAVENOUS

## 2015-10-06 MED ORDER — MEPERIDINE HCL 25 MG/ML IJ SOLN: 6.2500 mg | INTRAMUSCULAR | Status: DC | PRN

## 2015-10-06 MED ORDER — NORCO 5-325 MG PO TABS: 1.0000 | ORAL_TABLET | Freq: Four times a day (QID) | ORAL | Status: DC | PRN

## 2015-10-06 MED ORDER — PROMETHAZINE HCL 25 MG/ML IJ SOLN: 6.2500 mg | INTRAMUSCULAR | Status: DC | PRN

## 2015-10-06 MED ORDER — MIDAZOLAM HCL 5 MG/5ML IJ SOLN
INTRAMUSCULAR | Status: DC | PRN
  Administered 2015-10-06: 2 mg via INTRAVENOUS

## 2015-10-06 MED ORDER — DEXAMETHASONE SODIUM PHOSPHATE 4 MG/ML IJ SOLN
INTRAMUSCULAR | Status: DC | PRN
  Administered 2015-10-06: 4 mg via INTRAVENOUS

## 2015-10-06 MED ORDER — PROPOFOL 10 MG/ML IV BOLUS
INTRAVENOUS | Status: DC | PRN
Start: 2015-10-06 — End: 2015-10-06
  Administered 2015-10-06: 200 mg via INTRAVENOUS

## 2015-10-06 SURGICAL SUPPLY — 33 items
BLADE ABRADER 4.5 (BLADE) ×2 IMPLANT
BLADE FULL RADIUS 3.5 (BLADE) ×2 IMPLANT
BLADE SHAVER 4.5X7 STR FR (MISCELLANEOUS) IMPLANT
BNDG ESMARK 6X12 TAN STRL LF (GAUZE/BANDAGES/DRESSINGS) IMPLANT
BUR ACROMIONIZER 4.0 (BURR) IMPLANT
CUFF TOURN SGL QUICK 24 (TOURNIQUET CUFF) ×3
CUFF TOURN SGL QUICK 34 (TOURNIQUET CUFF)
CUFF TRNQT CYL 24X4X40X1 (TOURNIQUET CUFF) IMPLANT
CUFF TRNQT CYL 34X4X40X1 (TOURNIQUET CUFF) IMPLANT
DRAPE LEGGINS SURG 28X43 STRL (DRAPES) ×3 IMPLANT
DURAPREP 26ML APPLICATOR (WOUND CARE) ×3 IMPLANT
GAUZE SPONGE 4X4 12PLY STRL (GAUZE/BANDAGES/DRESSINGS) ×3 IMPLANT
GLOVE BIO SURGEON STRL SZ7.5 (GLOVE) ×5 IMPLANT
GLOVE BIO SURGEON STRL SZ8 (GLOVE) ×3 IMPLANT
GLOVE INDICATOR 8.0 STRL GRN (GLOVE) ×5 IMPLANT
GOWN STRL REIN 2XL XLG LVL4 (GOWN DISPOSABLE) ×6 IMPLANT
GOWN STRL REUS W/TWL 2XL LVL3 (GOWN DISPOSABLE) IMPLANT
IV LACTATED RINGER IRRG 3000ML (IV SOLUTION) ×6
IV LR IRRIG 3000ML ARTHROMATIC (IV SOLUTION) ×2 IMPLANT
KIT ROOM TURNOVER OR (KITS) ×3 IMPLANT
MANIFOLD 4PT FOR NEPTUNE1 (MISCELLANEOUS) ×3 IMPLANT
PACK ARTHROSCOPY KNEE (MISCELLANEOUS) ×3 IMPLANT
SET TUBE SUCT SHAVER OUTFL 24K (TUBING) ×3 IMPLANT
SOL PREP PVP 2OZ (MISCELLANEOUS) ×3
SOLUTION PREP PVP 2OZ (MISCELLANEOUS) ×1 IMPLANT
SUT ETHILON 3-0 FS-10 30 BLK (SUTURE) ×3
SUTURE EHLN 3-0 FS-10 30 BLK (SUTURE) ×1 IMPLANT
TAPE MICROFOAM 4IN (TAPE) ×3 IMPLANT
TUBING ARTHRO INFLOW-ONLY STRL (TUBING) ×3 IMPLANT
WAND HAND CNTRL MULTIVAC 50 (MISCELLANEOUS) ×2 IMPLANT
WAND HAND CNTRL MULTIVAC 90 (MISCELLANEOUS) ×2 IMPLANT
WAND MEGAVAC 90 (MISCELLANEOUS) IMPLANT
WRAP KNEE W/COLD PACKS 25.5X14 (SOFTGOODS) ×2 IMPLANT

## 2015-10-06 NOTE — Transfer of Care (Signed)
Immediate Anesthesia Transfer of Care Note  Patient: Amanda [name]  Procedure(s) Performed: Procedure(s) with comments: RIGHT KNEE ARTHROSCOPY WITH DEBRIDEMENT (Right) - Diabetic - insulin and oral meds  Patient Location: PACU  Anesthesia Type: General  Level of Consciousness: awake, alert  and patient cooperative  Airway and Oxygen Therapy: Patient Spontanous Breathing and Patient connected to supplemental oxygen  Post-op Assessment: Post-op Vital signs reviewed, Patient's Cardiovascular Status Stable, Respiratory Function Stable, Patent Airway and No signs of Nausea or vomiting  Post-op Vital Signs: Reviewed and stable  Complications: No apparent anesthesia complications

## 2015-10-06 NOTE — Anesthesia Procedure Notes (Signed)
Procedure Name: LMA Insertion Date/Time: 10/06/2015 7:37 AM Performed by: Maree Krabbe Pre-anesthesia Checklist: Patient identified, Emergency Drugs available, Suction available, Timeout performed and Patient being monitored Patient Re-evaluated:Patient Re-evaluated prior to inductionOxygen Delivery Method: Circle system utilized Preoxygenation: Pre-oxygenation with 100% oxygen Intubation Type: IV induction LMA: LMA inserted LMA Size: 4.0 Number of attempts: 1 Placement Confirmation: positive ETCO2 and breath sounds checked- equal and bilateral Tube secured with: Tape

## 2015-10-06 NOTE — Discharge Instructions (Signed)
General Anesthesia, Adult, Care After °Refer to this sheet in the next few weeks. These instructions provide you with information on caring for yourself after your procedure. Your health care provider may also give you more specific instructions. Your treatment has been planned according to current medical practices, but problems sometimes occur. Call your health care provider if you have any problems or questions after your procedure. °WHAT TO EXPECT AFTER THE PROCEDURE °After the procedure, it is typical to experience: °· Sleepiness. °· Nausea and vomiting. °HOME CARE INSTRUCTIONS °· For the first 24 hours after general anesthesia: °¨ Have a responsible person with you. °¨ Do not drive a car. If you are alone, do not take public transportation. °¨ Do not drink alcohol. °¨ Do not take medicine that has not been prescribed by your health care provider. °¨ Do not sign important papers or make important decisions. °¨ You may resume a normal diet and activities as directed by your health care provider. °· Change bandages (dressings) as directed. °· If you have questions or problems that seem related to general anesthesia, call the hospital and ask for the anesthetist or anesthesiologist on call. °SEEK MEDICAL CARE IF: °· You have nausea and vomiting that continue the day after anesthesia. °· You develop a rash. °SEEK IMMEDIATE MEDICAL CARE IF:  °· You have difficulty breathing. °· You have chest pain. °· You have any allergic problems. °  °This information is not intended to replace advice given to you by your health care provider. Make sure you discuss any questions you have with your health care provider. °  °Document Released: 11/25/2000 Document Revised: 09/09/2014 Document Reviewed: 12/18/2011 °Elsevier Interactive Patient Education ©2016 [employer] ° ° °[facility]  °Stormee Duda B. Yatziry Deakins, Jr., M.D. [phone]  ° °KNEE ARTHROSCOPY POST OPERATION INSTRUCTIONS: ° °PLEASE  READ THESE INSTRUCTIONS ABOUT POST OPERATION CARE. THEY WILL ANSWER MOST OF YOUR QUESTIONS.  °You have been given a prescription for pain. Please take as directed for pain.  °You can walk, keeping the knee slightly stiff-avoid doing too much bending the first day. (if ACL reconstruction is performed, keep brace locked in extension when walking.)  °You will use crutches or cane if needed. Can weight bear as tolerated  °Plan to take three to four days off from work. You can resume work when you are comfortable. (This can be a week or more, depending on the type of work you do.)  °To reduce pain and swelling, place one to two pillows under the knee the first two or three days when sitting or lying. An ice pack may be placed on top of the area over the dressing. Instructions for making homemade icepack are as follow:  °Flexible homemade alcohol water ice pack  °2 cups water  °1 cup rubbing alcohol  °food coloring for the blue tint (optional)  °2 zip-top bags - gallon-size  °Mix the water and alcohol together in one of your zip-top bags and add food coloring. Release as much air as possible and seal the bag. Place in freezer for at least 12 hours.  °The small incisions in your knee are closed with nylon stitches. They will be removed in the office.  °The bulky dressing may be removed in the third day after surgery. (If ACL surgery-DO NOT REMOVE BANDAGES). Put a waterproof band-aid over each stitch. Do not put any creams or ointments on wounds. You may shower at this time, but change waterproof band-aids after showering. KEEP INCISIONS CLEAN   AND DRY UNTIL YOU RETURN TO THE OFFICE.  °Sometimes the operative area remains somewhat painful and swollen for several weeks. This is usually nothing to worry about, but call if you have any excessive symptoms, especially fever. It is not unusual to have a low grade fever of 99 degrees for the first few days. If persist after 3-4 days call the office. It is not uncommon for the pain  to be a little worse on the third day after surgery.  °Begin doing gentle exercises right away. They will be limited by the amount of pain and swelling you have.  Exercising will reduce the swelling, increase motion, and prevent muscle weakness. Exercises: Straight leg raising and gentle knee bending.  °Take 81 milligram aspirin twice a day for 2 weeks after meals or milk. This along with elevation will help reduce the possibility of phlebitis in your operated leg.  °Avoid strenuous athletics for a minimum of 4 to 6 weeks after arthroscopic surgery (approximately five months if ACL surgery).  °If the surgery included ACL reconstruction the brace that is supplied to the extremity post surgery is to be locked in extension when you are asleep and is to be locked in extension when you are ambulating. It can be unlocked for exercises or sitting.  °Keep your post surgery appointment that has been made for you. If you do not remember the date call [phone]. Your follow up appointment should be between 7-10 days.  ° °

## 2015-10-06 NOTE — Op Note (Addendum)
Patient: Amanda [name], Amanda [name]  Preoperative diagnosis: Possible chondral lesion or loose body right knee  Postop diagnosis: Reactive synovitis  Operation: Arthroscopic limited synovectomy  Surgeon: Randon Goldsmith, MD  Anesthesia: Gen.   History: Patient's had a history of right knee pain secondary to an injury.  She had a remote medial compartment unicondylar knee replacement. X-rays after her injury did not reveal any change in the satisfactory position of her medial unicondylar knee components.  The patient was scheduled for surgery due to persistent discomfort despite conservative treatment. It was thought she might have a chondral lesion or loose body secondary to the injury.  The patient was taken the operating room where satisfactory general anesthesia was achieved. A tourniquet and leg holder were was applied to the right thigh. A well leg support was applied to the nonoperative extremity. The patient was given IV clindamycin at this time. The right knee was prepped and draped in usual fashion for an arthroscopic procedure. An inflow cannula was introduced superomedially. The joint was distended with lactated Ringer's. Scope was introduced through an inferolateral puncture wound and a probe through an inferomedial puncture wound. Inspection of the medial compartment revealed  maintenance of satisfactory position of the medial compartment unicondylar components with no evidence for loosening. There was a reactive synovitis about the components and some synovial overgrowth. No loose bodies were appreciated. There was a smooth transition zone from the articular surface to the superior portion of the femoral component. I went ahead and resected the area of synovial overgrowth with a small synovial resector and then further debrided the reactive synovium with an ArthroCare angled wand. Inspection of the intercondylar notch revealed some mild  fraying. The cruciates appeared to be intact.. Inspection of  the the lateral compartment revealed minimal chondral irregularity in the mid third of the lateral tibial plateau. I used a turbo whisker to debride this area.   Trochlear groove was inspected. There were some grade 2-3 chondral changes in the more proximal portion of the trochlear groove.  Retropatellar surface was smooth. I observed patella tracking. The patella seemed to track fairly well.  The instruments were removed from the joint at this time. The puncture wounds were closed with 3-0 nylon in vertical mattress fashion. I injected each puncture wound with several cc of half percent Marcaine without epinephrine. Betadine was applied the wounds followed by sterile dressing. An ice pack was applied to the right knee. The patient was awakened and transferred to the stretcher bed. The patient was taken to the recovery room in satisfactory condition.  The tourniquet was not inflated during the course of the procedure. Blood loss was negligible.

## 2015-10-06 NOTE — H&P (Signed)
  H and P reviewed. No changes. Uploaded at later date. 

## 2015-10-06 NOTE — Anesthesia Postprocedure Evaluation (Signed)
Anesthesia Post Note  Patient: Amanda [name]  Procedure(s) Performed: Procedure(s) (LRB): RIGHT KNEE ARTHROSCOPY WITH DEBRIDEMENT (Right)  Patient location during evaluation: PACU Anesthesia Type: General Level of consciousness: awake and alert and oriented Pain management: pain level controlled Vital Signs Assessment: post-procedure vital signs reviewed and stable Respiratory status: spontaneous breathing and nonlabored ventilation Cardiovascular status: stable Postop Assessment: no signs of nausea or vomiting and adequate PO intake Anesthetic complications: no    Harolyn Rutherford

## 2015-10-06 NOTE — Anesthesia Preprocedure Evaluation (Addendum)
Anesthesia Evaluation  Patient identified by MRN, date of birth, ID band Patient awake    Reviewed: Allergy & Precautions, NPO status , Patient's Chart, lab work & pertinent test results, reviewed documented beta blocker date and time   Airway Mallampati: II  TM Distance: >3 FB Neck ROM: Full    Dental  (+) Partial Upper, Missing Missing multiple lower teeth:   Pulmonary asthma ,    Pulmonary exam normal        Cardiovascular hypertension, Pt. on medications Normal cardiovascular exam     Neuro/Psych  Headaches,  Neuromuscular disease negative psych ROS   GI/Hepatic Neg liver ROS, GERD  Medicated and Controlled,  Endo/Other  diabetes, Type 2, Oral Hypoglycemic Agents  Renal/GU negative Renal ROS     Musculoskeletal  (+) Arthritis , Osteoarthritis,  Fibromyalgia -  Abdominal   Peds  Hematology negative hematology ROS (+)   Anesthesia Other Findings   Reproductive/Obstetrics                            Anesthesia Physical Anesthesia Plan  ASA: II  Anesthesia Plan: General   Post-op Pain Management:    Induction: Intravenous  Airway Management Planned: LMA  Additional Equipment:   Intra-op Plan:   Post-operative Plan:   Informed Consent: I have reviewed the patients History and Physical, chart, labs and discussed the procedure including the risks, benefits and alternatives for the proposed anesthesia with the patient or authorized representative who has indicated his/her understanding and acceptance.     Plan Discussed with: CRNA  Anesthesia Plan Comments:         Anesthesia Quick Evaluation

## 2015-10-09 ENCOUNTER — Encounter: Payer: Self-pay | Admitting: Unknown Physician Specialty

## 2015-12-01 ENCOUNTER — Emergency Department
Admission: EM | Admit: 2015-12-01 | Discharge: 2015-12-01 | Disposition: A | Discharge status: Home / Self Care | Payer: Medicare Other | Attending: Emergency Medicine | Admitting: Emergency Medicine

## 2015-12-01 ENCOUNTER — Encounter: Payer: Self-pay | Admitting: Emergency Medicine

## 2015-12-01 ENCOUNTER — Emergency Department: Payer: Medicare Other

## 2015-12-01 DIAGNOSIS — E119 Type 2 diabetes mellitus without complications: Secondary | ICD-10-CM | POA: Insufficient documentation

## 2015-12-01 DIAGNOSIS — Y998 Other external cause status: Secondary | ICD-10-CM | POA: Diagnosis not present

## 2015-12-01 DIAGNOSIS — Z794 Long term (current) use of insulin: Secondary | ICD-10-CM | POA: Diagnosis not present

## 2015-12-01 DIAGNOSIS — Z7952 Long term (current) use of systemic steroids: Secondary | ICD-10-CM | POA: Diagnosis not present

## 2015-12-01 DIAGNOSIS — S0990XA Unspecified injury of head, initial encounter: Secondary | ICD-10-CM

## 2015-12-01 DIAGNOSIS — I10 Essential (primary) hypertension: Secondary | ICD-10-CM | POA: Diagnosis not present

## 2015-12-01 DIAGNOSIS — M1990 Unspecified osteoarthritis, unspecified site: Secondary | ICD-10-CM | POA: Insufficient documentation

## 2015-12-01 DIAGNOSIS — W06XXXA Fall from bed, initial encounter: Secondary | ICD-10-CM | POA: Diagnosis not present

## 2015-12-01 DIAGNOSIS — W19XXXA Unspecified fall, initial encounter: Secondary | ICD-10-CM

## 2015-12-01 DIAGNOSIS — Y9384 Activity, sleeping: Secondary | ICD-10-CM | POA: Insufficient documentation

## 2015-12-01 DIAGNOSIS — S0083XA Contusion of other part of head, initial encounter: Secondary | ICD-10-CM | POA: Insufficient documentation

## 2015-12-01 DIAGNOSIS — Z79899 Other long term (current) drug therapy: Secondary | ICD-10-CM | POA: Insufficient documentation

## 2015-12-01 DIAGNOSIS — J45909 Unspecified asthma, uncomplicated: Secondary | ICD-10-CM | POA: Diagnosis not present

## 2015-12-01 DIAGNOSIS — E041 Nontoxic single thyroid nodule: Secondary | ICD-10-CM | POA: Insufficient documentation

## 2015-12-01 DIAGNOSIS — Z7984 Long term (current) use of oral hypoglycemic drugs: Secondary | ICD-10-CM | POA: Insufficient documentation

## 2015-12-01 DIAGNOSIS — Y9289 Other specified places as the place of occurrence of the external cause: Secondary | ICD-10-CM | POA: Insufficient documentation

## 2015-12-01 DIAGNOSIS — Z792 Long term (current) use of antibiotics: Secondary | ICD-10-CM | POA: Insufficient documentation

## 2015-12-01 DIAGNOSIS — S098XXA Other specified injuries of head, initial encounter: Secondary | ICD-10-CM | POA: Insufficient documentation

## 2015-12-01 MED ORDER — OXYCODONE-ACETAMINOPHEN 5-325 MG PO TABS: 2.0000 | ORAL_TABLET | Freq: Four times a day (QID) | ORAL | Status: DC | PRN

## 2015-12-01 MED ORDER — OXYCODONE-ACETAMINOPHEN 5-325 MG PO TABS
2.0000 | ORAL_TABLET | Freq: Once | ORAL | Status: AC
  Administered 2015-12-01: 2 via ORAL
  Filled 2015-12-01: qty 2

## 2015-12-01 MED ORDER — HYDROCHLOROTHIAZIDE 25 MG PO TABS: 25.0000 mg | ORAL_TABLET | Freq: Every day | ORAL | Status: DC
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
## 2015-12-01 NOTE — Discharge Instructions (Signed)
Facial or Scalp Contusion A facial or scalp contusion is a deep bruise on the face or head. Injuries to the face and head generally cause a lot of swelling, especially around the eyes. Contusions are the result of an injury that caused bleeding under the skin. The contusion may turn blue, purple, or yellow. Minor injuries will give you a painless contusion, but more severe contusions may stay painful and swollen for a few weeks.  CAUSES  A facial or scalp contusion is caused by a blunt injury or trauma to the face or head area.  SIGNS AND SYMPTOMS   Swelling of the injured area.   Discoloration of the injured area.   Tenderness, soreness, or pain in the injured area.  DIAGNOSIS  The diagnosis can be made by taking a medical history and doing a physical exam. An X-ray exam, CT scan, or MRI may be needed to determine if there are any associated injuries, such as broken bones (fractures). TREATMENT  Often, the best treatment for a facial or scalp contusion is applying cold compresses to the injured area. Over-the-counter medicines may also be recommended for pain control.  HOME CARE INSTRUCTIONS   Only take over-the-counter or prescription medicines as directed by your health care provider.   Apply ice to the injured area.   Put ice in a plastic bag.   Place a towel between your skin and the bag.   Leave the ice on for 20 minutes, 2-3 times a day.  SEEK MEDICAL CARE IF:  You have bite problems.   You have pain with chewing.   You are concerned about facial defects. SEEK IMMEDIATE MEDICAL CARE IF:  You have severe pain or a headache that is not relieved by medicine.   You have unusual sleepiness, confusion, or personality changes.   You throw up (vomit).   You have a persistent nosebleed.   You have double vision or blurred vision.   You have fluid drainage from your nose or ear.   You have difficulty walking or using your arms or legs.  MAKE SURE YOU:     Understand these instructions.  Will watch your condition.  Will get help right away if you are not doing well or get worse.   This information is not intended to replace advice given to you by your health care provider. Make sure you discuss any questions you have with your health care provider.   Document Released: 09/26/2004 Document Revised: 09/09/2014 Document Reviewed: 04/01/2013 Elsevier Interactive Patient Education 2016 [employer]  Hypertension Hypertension, commonly called high blood pressure, is when the force of blood pumping through your arteries is too strong. Your arteries are the blood vessels that carry blood from your heart throughout your body. A blood pressure reading consists of a higher number over a lower number, such as 110/72. The higher number (systolic) is the pressure inside your arteries when your heart pumps. The lower number (diastolic) is the pressure inside your arteries when your heart relaxes. Ideally you want your blood pressure below 120/80. Hypertension forces your heart to work harder to pump blood. Your arteries may become narrow or stiff. Having untreated or uncontrolled hypertension can cause heart attack, stroke, kidney disease, and other problems. RISK FACTORS Some risk factors for high blood pressure are controllable. Others are not.  Risk factors you cannot control include:   Race. You may be at higher risk if you are African American.  Age. Risk increases with age.  Gender. Men are at  higher risk than women before age 35 years. After age 40, women are at higher risk than men. Risk factors you can control include:  Not getting enough exercise or physical activity.  Being overweight.  Getting too much fat, sugar, calories, or salt in your diet.  Drinking too much alcohol. SIGNS AND SYMPTOMS Hypertension does not usually cause signs or symptoms. Extremely high blood pressure (hypertensive crisis) may cause headache, anxiety,  shortness of breath, and nosebleed. DIAGNOSIS To check if you have hypertension, your health care provider will measure your blood pressure while you are seated, with your arm held at the level of your heart. It should be measured at least twice using the same arm. Certain conditions can cause a difference in blood pressure between your right and left arms. A blood pressure reading that is higher than normal on one occasion does not mean that you need treatment. If it is not clear whether you have high blood pressure, you may be asked to return on a different day to have your blood pressure checked again. Or, you may be asked to monitor your blood pressure at home for 1 or more weeks. TREATMENT Treating high blood pressure includes making lifestyle changes and possibly taking medicine. Living a healthy lifestyle can help lower high blood pressure. You may need to change some of your habits. Lifestyle changes may include:  Following the DASH diet. This diet is high in fruits, vegetables, and whole grains. It is low in salt, red meat, and added sugars.  Keep your sodium intake below 2,300 mg per day.  Getting at least 30-45 minutes of aerobic exercise at least 4 times per week.  Losing weight if necessary.  Not smoking.  Limiting alcoholic beverages.  Learning ways to reduce stress. Your health care provider may prescribe medicine if lifestyle changes are not enough to get your blood pressure under control, and if one of the following is true:  You are 39-76 years of age and your systolic blood pressure is above 140.  You are 49 years of age or older, and your systolic blood pressure is above 150.  Your diastolic blood pressure is above 90.  You have diabetes, and your systolic blood pressure is over 140 or your diastolic blood pressure is over 90.  You have kidney disease and your blood pressure is above 140/90.  You have heart disease and your blood pressure is above 140/90. Your  personal target blood pressure may vary depending on your medical conditions, your age, and other factors. HOME CARE INSTRUCTIONS  Have your blood pressure rechecked as directed by your health care provider.   Take medicines only as directed by your health care provider. Follow the directions carefully. Blood pressure medicines must be taken as prescribed. The medicine does not work as well when you skip doses. Skipping doses also puts you at risk for problems.  Do not smoke.   Monitor your blood pressure at home as directed by your health care provider. SEEK MEDICAL CARE IF:   You think you are having a reaction to medicines taken.  You have recurrent headaches or feel dizzy.  You have swelling in your ankles.  You have trouble with your vision. SEEK IMMEDIATE MEDICAL CARE IF:  You develop a severe headache or confusion.  You have unusual weakness, numbness, or feel faint.  You have severe chest or abdominal pain.  You vomit repeatedly.  You have trouble breathing. MAKE SURE YOU:   Understand these instructions.  Will watch  your condition.  Will get help right away if you are not doing well or get worse.   This information is not intended to replace advice given to you by your health care provider. Make sure you discuss any questions you have with your health care provider.   Document Released: 08/19/2005 Document Revised: 01/03/2015 Document Reviewed: 06/11/2013 Elsevier Interactive Patient Education [employer]

## 2015-12-01 NOTE — ED Notes (Signed)
Patient states that on Sunday night she was dreaming and she fell out of bed hitting the right side of her face, patient with swelling, tenderness, and bruising to the right side of the face

## 2015-12-13 ENCOUNTER — Emergency Department
Admission: EM | Admit: 2015-12-13 | Discharge: 2015-12-13 | Disposition: A | Discharge status: Home / Self Care | Payer: Medicare Other | Attending: Emergency Medicine | Admitting: Emergency Medicine

## 2015-12-13 ENCOUNTER — Encounter: Payer: Self-pay | Admitting: *Deleted

## 2015-12-13 DIAGNOSIS — I10 Essential (primary) hypertension: Secondary | ICD-10-CM | POA: Diagnosis not present

## 2015-12-13 DIAGNOSIS — J45909 Unspecified asthma, uncomplicated: Secondary | ICD-10-CM | POA: Insufficient documentation

## 2015-12-13 DIAGNOSIS — E119 Type 2 diabetes mellitus without complications: Secondary | ICD-10-CM | POA: Diagnosis not present

## 2015-12-13 DIAGNOSIS — Z7984 Long term (current) use of oral hypoglycemic drugs: Secondary | ICD-10-CM | POA: Insufficient documentation

## 2015-12-13 DIAGNOSIS — Z96651 Presence of right artificial knee joint: Secondary | ICD-10-CM | POA: Insufficient documentation

## 2015-12-13 DIAGNOSIS — M792 Neuralgia and neuritis, unspecified: Secondary | ICD-10-CM | POA: Diagnosis present

## 2015-12-13 DIAGNOSIS — Z888 Allergy status to other drugs, medicaments and biological substances status: Secondary | ICD-10-CM | POA: Diagnosis not present

## 2015-12-13 DIAGNOSIS — H5711 Ocular pain, right eye: Secondary | ICD-10-CM

## 2015-12-13 DIAGNOSIS — Z880 Allergy status to penicillin: Secondary | ICD-10-CM | POA: Insufficient documentation

## 2015-12-13 DIAGNOSIS — Z794 Long term (current) use of insulin: Secondary | ICD-10-CM | POA: Diagnosis not present

## 2015-12-13 DIAGNOSIS — Z90710 Acquired absence of both cervix and uterus: Secondary | ICD-10-CM | POA: Insufficient documentation

## 2015-12-13 DIAGNOSIS — Z79899 Other long term (current) drug therapy: Secondary | ICD-10-CM | POA: Diagnosis not present

## 2015-12-13 MED ORDER — FLUORESCEIN SODIUM 1 MG OP STRP
1.0000 | ORAL_STRIP | Freq: Once | OPHTHALMIC | Status: AC
  Administered 2015-12-13: 1 via OPHTHALMIC
  Filled 2015-12-13: qty 1

## 2015-12-13 MED ORDER — ARTIFICIAL TEARS OP OINT
TOPICAL_OINTMENT | Freq: Once | OPHTHALMIC | Status: DC
  Filled 2015-12-13: qty 3.5

## 2015-12-13 MED ORDER — OXYCODONE-ACETAMINOPHEN 5-325 MG PO TABS
1.0000 | ORAL_TABLET | Freq: Once | ORAL | Status: AC
Start: 2015-12-13 — End: 2015-12-13
  Administered 2015-12-13: 1 via ORAL

## 2015-12-13 MED ORDER — TETRACAINE HCL 0.5 % OP SOLN
2.0000 [drp] | Freq: Once | OPHTHALMIC | Status: AC
  Administered 2015-12-13: 2 [drp] via OPHTHALMIC
  Filled 2015-12-13: qty 2

## 2015-12-13 MED ORDER — ARTIFICIAL TEARS OP OINT
TOPICAL_OINTMENT | Freq: Once | OPHTHALMIC | Status: AC
  Administered 2015-12-13: via OPHTHALMIC
  Filled 2015-12-13: qty 3.5

## 2015-12-13 MED ORDER — OXYCODONE-ACETAMINOPHEN 5-325 MG PO TABS
ORAL_TABLET | ORAL | Status: AC
  Administered 2015-12-13: 1 via ORAL
  Filled 2015-12-13: qty 1

## 2015-12-13 NOTE — Discharge Instructions (Signed)
You were evaluated for right-sided facial pain as well as some eye pain, and although no certain cause was found, I am suspicious that you may be having nerve pain after the facial injury previous, called neuropathic pain.  I would recommend seeing a neurologist and you are referred to call Dr. Margaretmary EddyShah's office to make an appointment.  Given the pain in the right eye with some vision changes, I spoke with ophthalmologist and you have an appointment at 8 AM at [facility] for further evaluation.   Neuropathic Pain Neuropathic pain is pain caused by damage to the nerves that are responsible for certain sensations in your body (sensory nerves). The pain can be caused by damage to:   The sensory nerves that send signals to your spinal cord and brain (peripheral nervous system).  The sensory nerves in your brain or spinal cord (central nervous system). Neuropathic pain can make you more sensitive to pain. What would be a minor sensation for most people may feel very painful if you have neuropathic pain. This is usually a long-term condition that can be difficult to treat. The type of pain can differ from person to person. It may start suddenly (acute), or it may develop slowly and last for a long time (chronic). Neuropathic pain may come and go as damaged nerves heal or may stay at the same level for years. It often causes emotional distress, loss of sleep, and a lower quality of life. CAUSES  The most common cause of damage to a sensory nerve is diabetes. Many other diseases and conditions can also cause neuropathic pain. Causes of neuropathic pain can be classified as:  Toxic. Many drugs and chemicals can cause toxic damage. The most common cause of toxic neuropathic pain is damage from drug treatment for cancer (chemotherapy).  Metabolic. This type of pain can happen when a disease causes imbalances that damage nerves. Diabetes is the most common of these diseases. Vitamin B deficiency caused by  long-term alcohol abuse is another common cause.  Traumatic. Any injury that cuts, crushes, or stretches a nerve can cause damage and pain. A common example is feeling pain after losing an arm or leg (phantom limb pain).  Compression-related. If a sensory nerve gets trapped or compressed for a long period of time, the blood supply to the nerve can be cut off.  Vascular. Many blood vessel diseases can cause neuropathic pain by decreasing blood supply and oxygen to nerves.  Autoimmune. This type of pain results from diseases in which the body's defense system mistakenly attacks sensory nerves. Examples of autoimmune diseases that can cause neuropathic pain include lupus and multiple sclerosis.  Infectious. Many types of viral infections can damage sensory nerves and cause pain. Shingles infection is a common cause of this type of pain.  Inherited. Neuropathic pain can be a symptom of many diseases that are passed down through families (genetic). SIGNS AND SYMPTOMS  The main symptom is pain. Neuropathic pain is often described as:  Burning.  Shock-like.  Stinging.  Hot or cold.  Itching. DIAGNOSIS  No single test can diagnose neuropathic pain. Your health care provider will do a physical exam and ask you about your pain. You may use a pain scale to describe how bad your pain is. You may also have tests to see if you have a high sensitivity to pain and to help find the cause and location of any sensory nerve damage. These tests may include:  Imaging studies, such as:  X-rays.  CT scan.  MRI.  Nerve conduction studies to test how well nerve signals travel through your sensory nerves (electrodiagnostic testing).  Stimulating your sensory nerves through electrodes on your skin and measuring the response in your spinal cord and brain (somatosensory evoked potentials). TREATMENT  Treatment for neuropathic pain may change over time. You may need to try different treatment options or a  combination of treatments. Some options include:  Over-the-counter pain relievers.  Prescription medicines. Some medicines used to treat other conditions may also help neuropathic pain. These include medicines to:  Control seizures (anticonvulsants).  Relieve depression (antidepressants).  Prescription-strength pain relievers (narcotics). These are usually used when other pain relievers do not help.  Transcutaneous nerve stimulation (TENS). This uses electrical currents to block painful nerve signals. The treatment is painless.  Topical and local anesthetics. These are medicines that numb the nerves. They can be injected as a nerve block or applied to the skin.  Alternative treatments, such as:  Acupuncture.  Meditation.  Massage.  Physical therapy.  Pain management programs.  Counseling. HOME CARE INSTRUCTIONS  Learn as much as you can about your condition.  Take medicines only as directed by your health care provider.  Work closely with all your health care providers to find what works best for you.  Have a good support system at home.  Consider joining a chronic pain support group. SEEK MEDICAL CARE IF:  Your pain treatments are not helping.  You are having side effects from your medicines.  You are struggling with fatigue, mood changes, depression, or anxiety.   This information is not intended to replace advice given to you by your health care provider. Make sure you discuss any questions you have with your health care provider.   Document Released: 05/16/2004 Document Revised: 09/09/2014 Document Reviewed: 01/27/2014 Elsevier Interactive Patient Education [employer]

## 2015-12-13 NOTE — ED Provider Notes (Signed)
[facility] Emergency Department Provider Note   ____________________________________________  Time seen: Approximately 6:20 PM I have reviewed the triage vital signs and the triage nursing note.  HISTORY  Chief Complaint Fall   Historian Patient  HPI Amanda [name] is a 50 y.o. female who is here for evaluation of right-sided facial pain. She reports that she was seen a couple of weeks ago after she had a fall while sleeping off of her bed into a metal pole that cause a laceration on her right cheek near her right nasolabial fold. She states that she waited a week or so before she even came in for evaluation, and then had imaging studies which did not show fractures.  She has had resolution of the bruising and swelling of the right-sided face, but she's had persistent constant and sharp facial pains that start just at the top of the hairline and go down to up about the lip on the right side. She's had some photophobia especially in the right eye.  She denies weakness, confusion, or passing out. She thinks she might have some mild blurry vision in the right eye.  Symptoms are moderate to severe.  She has a history of prior migraines, but states this does not feel like a migraine to her, she's had a mild right frontal headache, but mostly right soft tissue facial pain.    Past Medical History  Diagnosis Date  . Diabetes mellitus without complication (HCC)   . Hypertension     no meds currently  . Arthritis     knees, shoulder, upper back  . Thyroid nodule     bilateral and goiter  . GERD (gastroesophageal reflux disease)   . Headache     migraines - 5x/mo  . Fibromyalgia   . Wears contact lenses   . Wears dentures     partial upper  . Motion sickness     ships  . Asthma     There are no active problems to display for this patient.   Past Surgical History  Procedure Laterality Date  . Abdominal surgery      laparoscopy x4 with lysis of  adhesions  . Ectopic pregnancy surgery    . Joint replacement Right 12/16/12    knee- medial - makoplasty  . Knee arthroscopy Right     partial medial and lateral meniscectomies  . Hernia repair    . Rotator cuff repair Left   . Abdominal hysterectomy    . Cesarean section    . Knee arthroscopy Right 10/06/2015    Procedure: RIGHT KNEE ARTHROSCOPY WITH DEBRIDEMENT;  Surgeon: Erin Sons, MD;  Location: [facility];  Service: Orthopedics;  Laterality: Right;  Diabetic - insulin and oral meds  . Appendectomy      Current Outpatient Rx  Name  Route  Sig  Dispense  Refill  . albuterol (ACCUNEB) 1.25 MG/3ML nebulizer solution   Nebulization   Take 1 ampule by nebulization every 6 (six) hours as needed for wheezing.         [name] Kitchen ALPRAZolam (XANAX) 1 MG tablet   Oral   Take 1 mg by mouth at bedtime as needed for anxiety or sleep.         . ARIPiprazole (ABILIFY) 10 MG tablet   Oral   Take 10 mg by mouth daily. AM         . Ascorbic Acid (VITAMIN C PO)   Oral   Take by mouth daily.         [name] Kitchen  azithromycin (ZITHROMAX) 250 MG tablet   Oral   Take 1 tablet (250 mg total) by mouth daily.   4 each   0   . BIOTIN PO   Oral   Take by mouth daily.         . canagliflozin (INVOKANA) 300 MG TABS tablet   Oral   Take 300 mg by mouth daily before breakfast.         . Cholecalciferol (VITAMIN D PO)   Oral   Take by mouth daily.         . cycloSPORINE (RESTASIS) 0.05 % ophthalmic emulsion      1 drop 2 (two) times daily.         . DULoxetine (CYMBALTA) 60 MG capsule   Oral   Take 60 mg by mouth daily. AM         . flunisolide (NASALIDE) 25 MCG/ACT (0.025%) SOLN   Nasal   Place 2 sprays into the nose daily as needed.         . Glucosamine-Chondroitin (GLUCOSAMINE CHONDR COMPLEX PO)   Oral   Take by mouth daily.         . hydrochlorothiazide (HYDRODIURIL) 25 MG tablet   Oral   Take 1 tablet (25 mg total) by mouth daily.   30 tablet   1   .  ibuprofen (ADVIL,MOTRIN) 800 MG tablet   Oral   Take 1 tablet (800 mg total) by mouth every 8 (eight) hours as needed for moderate pain.   15 tablet   0   . insulin detemir (LEVEMIR) 100 UNIT/ML injection   Subcutaneous   Inject 10 Units into the skin at bedtime.         . metFORMIN (GLUCOPHAGE) 500 MG tablet   Oral   Take 1,000 mg by mouth 2 (two) times daily with a meal.          . mirtazapine (REMERON) 15 MG tablet   Oral   Take 15 mg by mouth at bedtime as needed.         . Multiple Vitamin (MULTIVITAMIN) tablet   Oral   Take 1 tablet by mouth daily.         Awanda Mink 5-325 MG tablet   Oral   Take 1-2 tablets by mouth every 6 (six) hours as needed for moderate pain. MAXIMUM TOTAL ACETAMINOPHEN DOSE IS 4000 MG PER DAY   25 tablet   0     Dispense as written.   [name] Kitchen omeprazole (PRILOSEC) 40 MG capsule   Oral   Take 40 mg by mouth daily. 1 hr before supper         . ondansetron (ZOFRAN) 4 MG tablet   Oral   Take 1 tablet (4 mg total) by mouth every 8 (eight) hours as needed for nausea or vomiting.   15 tablet   0   . oxyCODONE-acetaminophen (PERCOCET) 5-325 MG tablet   Oral   Take 2 tablets by mouth every 6 (six) hours as needed for moderate pain or severe pain.   30 tablet   0   . pregabalin (LYRICA) 200 MG capsule   Oral   Take 200 mg by mouth 2 (two) times daily.         . propranolol ER (INDERAL LA) 80 MG 24 hr capsule   Oral   Take 80 mg by mouth as needed.         . SUMAtriptan (IMITREX) 25 MG tablet  Oral   Take 25 mg by mouth every 2 (two) hours as needed for migraine. May repeat in 2 hours if headache persists or recurs.         [name] Kitchen. tiZANidine (ZANAFLEX) 4 MG tablet   Oral   Take 4 mg by mouth every 6 (six) hours as needed for muscle spasms.         [name] Kitchen. VITAMIN A PO   Oral   Take by mouth daily.         [name] Kitchen. zolpidem (AMBIEN) 10 MG tablet   Oral   Take 10 mg by mouth at bedtime as needed for sleep.            Allergies Penicillins and Ace inhibitors  No family history on file.  Social History Social History  Substance Use Topics  . Smoking status: Never Smoker   . Smokeless tobacco: Never Used  . Alcohol Use: No    Review of Systems  Constitutional: Negative for fever. Eyes: Photophobia. ENT: Negative for sore throat. Cardiovascular: Negative for chest pain. Respiratory: Negative for shortness of breath. Gastrointestinal: Negative for abdominal pain, vomiting and diarrhea. Genitourinary: Negative for dysuria. Musculoskeletal: Negative for back pain. Skin: Negative for rash. Neurological: As per history of present illness. 10 point Review of Systems otherwise negative ____________________________________________   PHYSICAL EXAM:  VITAL SIGNS: ED Triage Vitals  Enc Vitals Group     BP 12/13/15 1628 142/94 mmHg     Pulse Rate 12/13/15 1628 118     Resp 12/13/15 1628 18     Temp 12/13/15 1628 98.5 F (36.9 C)     Temp Source 12/13/15 1628 Oral     SpO2 12/13/15 1628 99 %     Weight 12/13/15 1628 190 lb (86.183 kg)     Height 12/13/15 1628 5\' 5"  (1.651 m)     Head Cir --      Peak Flow --      Pain Score 12/13/15 1628 10     Pain Loc --      Pain Edu? --      Excl. in GC? --      Constitutional: Alert and oriented. Well appearing and in no distress. HEENT   Head: Normocephalic and atraumatic.  Healed scar on the right cheek below the eyelids sort of horizontally/obliquely near the right nasolabial fold. No evidence of swelling, ecchymosis, erythema currently      Eyes: Conjunctivae are normal. PERRL. Normal extraocular movements.She is sort of holding her right eyelid closed more so than the left, but when asked to open her eyes she is able to open them wide and equally. She has some mild proptosis which appears to be equal bilaterally.  Floor seen evaluation shows small right lateral scleral uptake, no corneal abrasion or ulcer.  Visual acuity right eye 20/70.  Left eye 20/30. Intraocular pressure right eye 27 mmHg, left eye 15 mmHg.      Ears:         Nose: No congestion/rhinnorhea.   Mouth/Throat: Mucous membranes are moist.   Neck: No stridor. Cardiovascular/Chest: Normal rate, regular rhythm.  No murmurs, rubs, or gallops. Respiratory: Normal respiratory effort without tachypnea nor retractions. Breath sounds are clear and equal bilaterally. No wheezes/rales/rhonchi. Gastrointestinal: Soft. No distention, no guarding, no rebound. Nontender.    Genitourinary/rectal:Deferred Musculoskeletal: Nontender with normal range of motion in all extremities. No joint effusions.  No lower extremity tenderness.  No edema. Neurologic:  Normal speech and language.  At rest she  appears to be having slightly more closed right eyelid, but she is able to perform equal eye opening and eye closing. She is able to complete additional facial/cranial nerve testing without weakness or numbness although at rest she appears to be holding the right side of her face without much movement. Skin:  Skin is warm, dry and intact. No rash noted. Psychiatric: Mood and affect are normal. Speech and behavior are normal. Patient exhibits appropriate insight and judgment.  ____________________________________________   EKG I, Governor Rooks, MD, the attending physician have personally viewed and interpreted all ECGs.  None ____________________________________________  LABS (pertinent positives/negatives)  None  ____________________________________________  RADIOLOGY All Xrays were viewed by me. Imaging interpreted by Radiologist.  None __________________________________________  PROCEDURES  Procedure(s) performed: fluoroscein eval right eye  - tetracaine and wood's lamp eval shows right lateral sclera uptake, no corneal abrasion.  Intraocular pressure assessment with tetracaine and tonopen.  Critical Care performed:  None  ____________________________________________   ED COURSE / ASSESSMENT AND PLAN  Pertinent labs & imaging results that were available during my care of the patient were reviewed by me and considered in my medical decision making (see chart for details).   Patient reports a complaint of pain more so than really any neurologic symptoms of weakness or numbness. This is also persistent symptoms, rather than any new symptom. I don't think clinically her symptoms are related to stroke or other intracranial emergency. It seems most likely that her symptoms may be due to a nerve contusion or cutaneous nerve injury after the facial contusion and laceration several weeks ago.  I also thought about possibly migraine, but patient states this is not her typical migraine, it's really facial pain rather than headache. There is no skin rash or evidence of zoster. No tender temporal artery.  Given that she is complaining of some eyelid complaints, I did check her eyes, and there is no corneal abrasion. Her intraocular pressure is a little different on the right side, little more elevated than the left. I discussed this with the on-call ophthalmologist, Dr. Duke Salvia, who did not feel like the patient needed any acute treatment for that the patient was even likely to be having symptoms due to the pressure of 27 mmHg. [facility] She did recommend Lacri-Lube.     CONSULTATIONS:   Dr. Duke Salvia, ophthalmology.   Patient / Family / Caregiver informed of clinical course, medical decision-making process, and agree with plan.   I discussed return precautions, follow-up instructions, and discharged instructions with patient and/or family.   ___________________________________________   FINAL CLINICAL IMPRESSION(S) / ED DIAGNOSES   Final diagnoses:  Neuralgia  Eye pain, right              Note: This dictation was prepared with  Dragon dictation. Any transcriptional errors that result from this process are unintentional   Governor Rooks, MD 12/13/15 2303

## 2015-12-13 NOTE — ED Notes (Signed)
C/o nausea.  No emesis seen.  Emibag given.  Continue to monitor.

## 2015-12-13 NOTE — ED Notes (Signed)
Report given to Shannon Martin, RN 

## 2015-12-13 NOTE — ED Notes (Signed)
Pt reports she fell 2 weeks ago and was seen in the ed after the fall, pt continues to have head pain into her face.

## 2016-01-04 ENCOUNTER — Emergency Department: Payer: Medicare Other

## 2016-01-04 ENCOUNTER — Encounter: Payer: Self-pay | Admitting: Emergency Medicine

## 2016-01-04 ENCOUNTER — Emergency Department
Admission: EM | Admit: 2016-01-04 | Discharge: 2016-01-04 | Disposition: A | Discharge status: Home / Self Care | Payer: Medicare Other | Attending: Emergency Medicine | Admitting: Emergency Medicine

## 2016-01-04 DIAGNOSIS — Z96651 Presence of right artificial knee joint: Secondary | ICD-10-CM | POA: Insufficient documentation

## 2016-01-04 DIAGNOSIS — M179 Osteoarthritis of knee, unspecified: Secondary | ICD-10-CM | POA: Diagnosis not present

## 2016-01-04 DIAGNOSIS — J45909 Unspecified asthma, uncomplicated: Secondary | ICD-10-CM | POA: Insufficient documentation

## 2016-01-04 DIAGNOSIS — Z79899 Other long term (current) drug therapy: Secondary | ICD-10-CM | POA: Diagnosis not present

## 2016-01-04 DIAGNOSIS — R0600 Dyspnea, unspecified: Secondary | ICD-10-CM | POA: Insufficient documentation

## 2016-01-04 DIAGNOSIS — I10 Essential (primary) hypertension: Secondary | ICD-10-CM | POA: Diagnosis not present

## 2016-01-04 DIAGNOSIS — E119 Type 2 diabetes mellitus without complications: Secondary | ICD-10-CM | POA: Insufficient documentation

## 2016-01-04 DIAGNOSIS — Z7984 Long term (current) use of oral hypoglycemic drugs: Secondary | ICD-10-CM | POA: Insufficient documentation

## 2016-01-04 DIAGNOSIS — R0789 Other chest pain: Secondary | ICD-10-CM | POA: Insufficient documentation

## 2016-01-04 DIAGNOSIS — Z880 Allergy status to penicillin: Secondary | ICD-10-CM | POA: Diagnosis not present

## 2016-01-04 DIAGNOSIS — Z794 Long term (current) use of insulin: Secondary | ICD-10-CM | POA: Diagnosis not present

## 2016-01-04 DIAGNOSIS — R079 Chest pain, unspecified: Secondary | ICD-10-CM

## 2016-01-04 LAB — BASIC METABOLIC PANEL
Anion gap: 11 (ref 5–15)
BUN: 19 mg/dL (ref 6–20)
CO2: 32 mmol/L (ref 22–32)
Calcium: 9.3 mg/dL (ref 8.9–10.3)
Chloride: 98 mmol/L — ABNORMAL LOW (ref 101–111)
Creatinine, Ser: 0.97 mg/dL (ref 0.44–1.00)
GFR calc Af Amer: >60 mL/min (ref >60)
GFR calc non Af Amer: >60 mL/min (ref >60)
Glucose, Bld: 154 mg/dL — ABNORMAL HIGH (ref 65–99)
Potassium: 3.5 mmol/L (ref 3.5–5.1)
Sodium: 141 mmol/L (ref 135–145)

## 2016-01-04 LAB — HEPATIC FUNCTION PANEL
ALT: 33 U/L (ref 14–54)
AST: 26 U/L (ref 15–41)
Albumin: 4.1 g/dL (ref 3.5–5.0)
Alkaline Phosphatase: 104 U/L (ref 38–126)
Bilirubin, Direct: <0.1 mg/dL — ABNORMAL LOW (ref 0.1–0.5)
Total Bilirubin: 0.5 mg/dL (ref 0.3–1.2)
Total Protein: 7.7 g/dL (ref 6.5–8.1)

## 2016-01-04 LAB — CBC
HCT: 40.9 % (ref 35.0–47.0)
Hemoglobin: 13.7 g/dL (ref 12.0–16.0)
MCH: 29.9 pg (ref 26.0–34.0)
MCHC: 33.6 g/dL (ref 32.0–36.0)
MCV: 88.9 fL (ref 80.0–100.0)
Platelets: 277 10*3/uL (ref 150–440)
RBC: 4.59 MIL/uL (ref 3.80–5.20)
RDW: 14.2 % (ref 11.5–14.5)
WBC: 5.4 10*3/uL (ref 3.6–11.0)

## 2016-01-04 LAB — TROPONIN I
Troponin I: <0.03 ng/mL (ref <0.031)
Troponin I: <0.03 ng/mL (ref <0.031)

## 2016-01-04 LAB — GLUCOSE, CAPILLARY: Glucose-Capillary: 91 mg/dL (ref 65–99)

## 2016-01-04 LAB — BRAIN NATRIURETIC PEPTIDE: B Natriuretic Peptide: 13 pg/mL (ref 0.0–100.0)

## 2016-01-04 LAB — LACTIC ACID, PLASMA: Lactic Acid, Venous: 1.5 mmol/L (ref 0.5–2.0)

## 2016-01-04 MED ORDER — ISOSORBIDE MONONITRATE ER 30 MG PO TB24
30.0000 mg | ORAL_TABLET | Freq: Every day | ORAL | Status: DC
  Administered 2016-01-04: 30 mg via ORAL
  Filled 2016-01-04: qty 1

## 2016-01-04 MED ORDER — SODIUM CHLORIDE 0.9 % IV SOLN
Freq: Once | INTRAVENOUS | Status: AC
  Administered 2016-01-04: 19:00:00 via INTRAVENOUS

## 2016-01-04 MED ORDER — IOPAMIDOL (ISOVUE-370) INJECTION 76%
100.0000 mL | Freq: Once | INTRAVENOUS | Status: AC | PRN
  Administered 2016-01-04: 100 mL via INTRAVENOUS

## 2016-01-04 MED ORDER — SODIUM CHLORIDE 0.9 % IV SOLN: Freq: Once | INTRAVENOUS | Status: DC

## 2016-01-04 MED ORDER — ASPIRIN 81 MG PO CHEW
324.0000 mg | CHEWABLE_TABLET | Freq: Once | ORAL | Status: AC
  Administered 2016-01-04: 324 mg via ORAL
  Filled 2016-01-04: qty 4

## 2016-01-04 NOTE — ED Notes (Signed)
Pt discharged home after verbalizing understanding of discharge instructions; nad noted. 

## 2016-01-04 NOTE — ED Notes (Signed)
Pt reports that she has been having chest pain x 3-4 days; has had sob a bit longer. Pt states sob has been getting unbearable and she has not been getting any relief. Pt sleepy, states she has been exhausted. Pt alert & oriented with nad note.

## 2016-01-04 NOTE — ED Notes (Signed)
Patient presents to the ED with chest pain and shortness of breath x 4-5 days.  Patient states chest pain is worse when she picks something up with her right hand.  Patient reports shortness of breath on exertion.  Patient has clammy skin at this time and reports intermittent diaphoresis for the past few days.  Patient appears to be having difficulty keeping her eyes open during triage.  No difficulty speaking.  Patient also reports feeling very thirsty.

## 2016-01-04 NOTE — ED Provider Notes (Signed)
[facility] Emergency Department Provider Note   ____________________________________________  Time seen: Approximately 6:07 PM  I have reviewed the triage vital signs and the nursing notes.   HISTORY  Chief Complaint Chest Pain; Shortness of Breath; and Fatigue    HPI Amanda [name] is a 50 y.o. female patient reports 4-5 days of chest pain which is sharp and occasionally pressure often times wakes up with that she short of breath at night often it is often short of breath with exertion. Chest pain can be with exertion or without exertion. Patient had been taking metoprolol for tachycardia. She stopped that in the last couple days she started it again and attempted To the chest pain shortness of breath.       Past Medical History  Diagnosis Date  . Diabetes mellitus without complication (HCC)   . Hypertension     no meds currently  . Arthritis     knees, shoulder, upper back  . Thyroid nodule     bilateral and goiter  . GERD (gastroesophageal reflux disease)   . Headache     migraines - 5x/mo  . Fibromyalgia   . Wears contact lenses   . Wears dentures     partial upper  . Motion sickness     ships  . Asthma     There are no active problems to display for this patient.   Past Surgical History  Procedure Laterality Date  . Abdominal surgery      laparoscopy x4 with lysis of adhesions  . Ectopic pregnancy surgery    . Joint replacement Right 12/16/12    knee- medial - makoplasty  . Knee arthroscopy Right     partial medial and lateral meniscectomies  . Hernia repair    . Rotator cuff repair Left   . Abdominal hysterectomy    . Cesarean section    . Knee arthroscopy Right 10/06/2015    Procedure: RIGHT KNEE ARTHROSCOPY WITH DEBRIDEMENT;  Surgeon: Erin Sons, MD;  Location: [facility];  Service: Orthopedics;  Laterality: Right;  Diabetic - insulin and oral meds  . Appendectomy      Current Outpatient Rx  Name  Route   Sig  Dispense  Refill  . albuterol (ACCUNEB) 1.25 MG/3ML nebulizer solution   Nebulization   Take 1 ampule by nebulization every 6 (six) hours as needed for wheezing.         [name] Kitchen ALPRAZolam (XANAX) 1 MG tablet   Oral   Take 1 mg by mouth at bedtime as needed for anxiety or sleep.         . ARIPiprazole (ABILIFY) 10 MG tablet   Oral   Take 10 mg by mouth daily as needed.         . canagliflozin (INVOKANA) 300 MG TABS tablet   Oral   Take 300 mg by mouth daily before breakfast.         . cycloSPORINE (RESTASIS) 0.05 % ophthalmic emulsion      1 drop 2 (two) times daily.         . flunisolide (NASALIDE) 25 MCG/ACT (0.025%) SOLN   Nasal   Place 2 sprays into the nose daily as needed.         . hydrochlorothiazide (HYDRODIURIL) 25 MG tablet   Oral   Take 1 tablet (25 mg total) by mouth daily.   30 tablet   1   . insulin detemir (LEVEMIR) 100 UNIT/ML injection   Subcutaneous  Inject 10 Units into the skin at bedtime.         . metFORMIN (GLUCOPHAGE) 500 MG tablet   Oral   Take 1,000 mg by mouth 2 (two) times daily with a meal.          . omeprazole (PRILOSEC) 40 MG capsule   Oral   Take 40 mg by mouth daily. 1 hr before supper         . pregabalin (LYRICA) 200 MG capsule   Oral   Take 200 mg by mouth 2 (two) times daily.         . propranolol ER (INDERAL LA) 80 MG 24 hr capsule   Oral   Take 80 mg by mouth as needed.         . SUMAtriptan (IMITREX) 25 MG tablet   Oral   Take 25 mg by mouth every 2 (two) hours as needed for migraine. May repeat in 2 hours if headache persists or recurs.         [name] Kitchen. tiZANidine (ZANAFLEX) 4 MG tablet   Oral   Take 4 mg by mouth every 6 (six) hours as needed for muscle spasms.         [name] Kitchen. BIOTIN PO   Oral   Take by mouth daily.         . DULoxetine (CYMBALTA) 60 MG capsule   Oral   Take 60 mg by mouth daily. AM           Allergies Penicillins and Ace inhibitors  No family history on  file.  Social History Social History  Substance Use Topics  . Smoking status: Never Smoker   . Smokeless tobacco: Never Used  . Alcohol Use: No    Review of Systems Constitutional: No fever/chills Eyes: No visual changes. ENT: No sore throat. Cardiovascular: Denies chest pain. Respiratory: Denies shortness of breath. Gastrointestinal: No abdominal pain.  No nausea, no vomiting.  No diarrhea.  No constipation. Genitourinary: Negative for dysuria. Musculoskeletal: Negative for back pain. Skin: Negative for rash. Neurological: Negative for headaches, focal weakness or numbness.  10-point ROS otherwise negative.  ____________________________________________   PHYSICAL EXAM:  VITAL SIGNS: ED Triage Vitals  Enc Vitals Group     BP 01/04/16 1517 119/73 mmHg     Pulse Rate 01/04/16 1517 86     Resp 01/04/16 1517 18     Temp 01/04/16 1517 98.3 F (36.8 C)     Temp Source 01/04/16 1517 Oral     SpO2 01/04/16 1517 100 %     Weight 01/04/16 1517 190 lb (86.183 kg)     Height 01/04/16 1517 5\' 5"  (1.651 m)     Head Cir --      Peak Flow --      Pain Score 01/04/16 1518 8     Pain Loc --      Pain Edu? --      Excl. in GC? --     Constitutional: Alert and oriented. Well appearing and in no acute distress. Eyes: Conjunctivae are normal. PERRL. EOMI. Head: Atraumatic. Nose: No congestion/rhinnorhea. Mouth/Throat: Mucous membranes are moist.  Oropharynx non-erythematous. Neck: No stridor.  Cardiovascular: Normal rate, regular rhythm. Grossly normal heart sounds.  Good peripheral circulation. Respiratory: Normal respiratory effort.  No retractions. Lungs CTAB. Gastrointestinal: Soft and nontender. No distention. No abdominal bruits. No CVA tenderness. Musculoskeletal: No lower extremity tenderness nor edema.  No joint effusions. Neurologic:  Normal speech and language. No gross focal neurologic deficits  are appreciated. No gait instability. Skin:  Skin is warm, dry and  intact. No rash noted. Psychiatric: Mood and affect are normal. Speech and behavior are normal.  ____________________________________________   LABS (all labs ordered are listed, but only abnormal results are displayed)  Labs Reviewed  BASIC METABOLIC PANEL - Abnormal; Notable for the following:    Chloride 98 (*)    Glucose, Bld 154 (*)    All other components within normal limits  HEPATIC FUNCTION PANEL - Abnormal; Notable for the following:    Bilirubin, Direct <0.1 (*)    All other components within normal limits  CBC  TROPONIN I  BRAIN NATRIURETIC PEPTIDE  LACTIC ACID, PLASMA  TROPONIN I  GLUCOSE, CAPILLARY  LACTIC ACID, PLASMA   ____________________________________________  EKG  KG read and interpreted by me shows normal sinus rhythm a rate of 89 normal axis no acute ST-T wave changes or is poor R-wave progression throughout the precordium ____________________________________________  RADIOLOGYAsked x-ray and CT angiogram of the chest are read by radiology as normal ____________________________________________   PROCEDURES    ____________________________________________   INITIAL IMPRESSION / ASSESSMENT AND PLAN / ED COURSE  Pertinent labs & imaging results that were available during my care of the patient were reviewed by me and considered in my medical decision making (see chart for details).  Discussed patient in detail with Dr. Harriette Bouillon A and. Who is on-call for cardiology. He recommends discharging the patient home and give her aspirin one regular now and into her now and again in the morning. He will see her at 9:00 in the morning in the office ____________________________________________   FINAL CLINICAL IMPRESSION(S) / ED DIAGNOSES  Final diagnoses:  Chest pain, unspecified chest pain type  Dyspnea      NEW MEDICATIONS STARTED DURING THIS VISIT:  New Prescriptions   No medications on file     Note:  This document was prepared using  Dragon voice recognition software and may include unintentional dictation errors.    Arnaldo Natal, MD 01/04/16 [phone]

## 2016-02-02 DIAGNOSIS — R000 Tachycardia, unspecified: Secondary | ICD-10-CM | POA: Insufficient documentation

## 2016-03-07 ENCOUNTER — Other Ambulatory Visit: Payer: Self-pay | Admitting: Internal Medicine

## 2016-03-07 DIAGNOSIS — Z1231 Encounter for screening mammogram for malignant neoplasm of breast: Secondary | ICD-10-CM

## 2016-03-18 ENCOUNTER — Emergency Department: Payer: Medicare Other

## 2016-03-18 ENCOUNTER — Observation Stay
Admission: EM | Admit: 2016-03-18 | Discharge: 2016-03-20 | Disposition: A | Discharge status: Home / Self Care | Payer: Medicare Other | Attending: Internal Medicine | Admitting: Internal Medicine

## 2016-03-18 DIAGNOSIS — M797 Fibromyalgia: Secondary | ICD-10-CM | POA: Diagnosis not present

## 2016-03-18 DIAGNOSIS — J069 Acute upper respiratory infection, unspecified: Secondary | ICD-10-CM | POA: Diagnosis not present

## 2016-03-18 DIAGNOSIS — M19019 Primary osteoarthritis, unspecified shoulder: Secondary | ICD-10-CM | POA: Insufficient documentation

## 2016-03-18 DIAGNOSIS — M170 Bilateral primary osteoarthritis of knee: Secondary | ICD-10-CM | POA: Diagnosis not present

## 2016-03-18 DIAGNOSIS — Z888 Allergy status to other drugs, medicaments and biological substances status: Secondary | ICD-10-CM | POA: Insufficient documentation

## 2016-03-18 DIAGNOSIS — J189 Pneumonia, unspecified organism: Secondary | ICD-10-CM

## 2016-03-18 DIAGNOSIS — E119 Type 2 diabetes mellitus without complications: Secondary | ICD-10-CM | POA: Diagnosis not present

## 2016-03-18 DIAGNOSIS — J90 Pleural effusion, not elsewhere classified: Secondary | ICD-10-CM | POA: Insufficient documentation

## 2016-03-18 DIAGNOSIS — Z794 Long term (current) use of insulin: Secondary | ICD-10-CM | POA: Diagnosis not present

## 2016-03-18 DIAGNOSIS — Z79899 Other long term (current) drug therapy: Secondary | ICD-10-CM | POA: Insufficient documentation

## 2016-03-18 DIAGNOSIS — K219 Gastro-esophageal reflux disease without esophagitis: Secondary | ICD-10-CM | POA: Insufficient documentation

## 2016-03-18 DIAGNOSIS — Z23 Encounter for immunization: Secondary | ICD-10-CM | POA: Insufficient documentation

## 2016-03-18 DIAGNOSIS — J45909 Unspecified asthma, uncomplicated: Secondary | ICD-10-CM | POA: Diagnosis not present

## 2016-03-18 DIAGNOSIS — I10 Essential (primary) hypertension: Secondary | ICD-10-CM | POA: Diagnosis not present

## 2016-03-18 DIAGNOSIS — J45901 Unspecified asthma with (acute) exacerbation: Secondary | ICD-10-CM

## 2016-03-18 DIAGNOSIS — M4690 Unspecified inflammatory spondylopathy, site unspecified: Secondary | ICD-10-CM | POA: Insufficient documentation

## 2016-03-18 DIAGNOSIS — Z880 Allergy status to penicillin: Secondary | ICD-10-CM | POA: Insufficient documentation

## 2016-03-18 DIAGNOSIS — J9601 Acute respiratory failure with hypoxia: Secondary | ICD-10-CM | POA: Diagnosis present

## 2016-03-18 DIAGNOSIS — K5641 Fecal impaction: Secondary | ICD-10-CM | POA: Insufficient documentation

## 2016-03-18 DIAGNOSIS — R0602 Shortness of breath: Secondary | ICD-10-CM

## 2016-03-18 DIAGNOSIS — G43909 Migraine, unspecified, not intractable, without status migrainosus: Secondary | ICD-10-CM | POA: Diagnosis not present

## 2016-03-18 DIAGNOSIS — Z90710 Acquired absence of both cervix and uterus: Secondary | ICD-10-CM | POA: Diagnosis not present

## 2016-03-18 DIAGNOSIS — Z96651 Presence of right artificial knee joint: Secondary | ICD-10-CM | POA: Diagnosis not present

## 2016-03-18 DIAGNOSIS — E049 Nontoxic goiter, unspecified: Secondary | ICD-10-CM | POA: Insufficient documentation

## 2016-03-18 LAB — CBC
HCT: 41.2 % (ref 35.0–47.0)
Hemoglobin: 14.1 g/dL (ref 12.0–16.0)
MCH: 30.4 pg (ref 26.0–34.0)
MCHC: 34.2 g/dL (ref 32.0–36.0)
MCV: 88.9 fL (ref 80.0–100.0)
Platelets: 216 10*3/uL (ref 150–440)
RBC: 4.64 MIL/uL (ref 3.80–5.20)
RDW: 15 % — ABNORMAL HIGH (ref 11.5–14.5)
WBC: 6.6 10*3/uL (ref 3.6–11.0)

## 2016-03-18 LAB — BASIC METABOLIC PANEL
Anion gap: 9 (ref 5–15)
BUN: 7 mg/dL (ref 6–20)
CO2: 26 mmol/L (ref 22–32)
Calcium: 9.1 mg/dL (ref 8.9–10.3)
Chloride: 104 mmol/L (ref 101–111)
Creatinine, Ser: 0.91 mg/dL (ref 0.44–1.00)
GFR calc Af Amer: >60 mL/min (ref >60)
GFR calc non Af Amer: >60 mL/min (ref >60)
Glucose, Bld: 114 mg/dL — ABNORMAL HIGH (ref 65–99)
Potassium: 3 mmol/L — ABNORMAL LOW (ref 3.5–5.1)
Sodium: 139 mmol/L (ref 135–145)

## 2016-03-18 LAB — POCT RAPID STREP A: Streptococcus, Group A Screen (Direct): NEGATIVE

## 2016-03-18 LAB — MAGNESIUM: Magnesium: 1.8 mg/dL (ref 1.7–2.4)

## 2016-03-18 LAB — RAPID INFLUENZA A&B ANTIGENS
Influenza A (ARMC): NEGATIVE
Influenza B (ARMC): NEGATIVE

## 2016-03-18 LAB — GLUCOSE, CAPILLARY: Glucose-Capillary: 208 mg/dL — ABNORMAL HIGH (ref 65–99)

## 2016-03-18 MED ORDER — ALBUTEROL SULFATE (2.5 MG/3ML) 0.083% IN NEBU
2.5000 mg | INHALATION_SOLUTION | Freq: Once | RESPIRATORY_TRACT | Status: AC
  Administered 2016-03-18: 2.5 mg via RESPIRATORY_TRACT
  Filled 2016-03-18: qty 3

## 2016-03-18 MED ORDER — POLYETHYLENE GLYCOL 3350 17 GM/SCOOP PO POWD: ORAL | Status: DC

## 2016-03-18 MED ORDER — LEVALBUTEROL HCL 1.25 MG/0.5ML IN NEBU
1.2500 mg | INHALATION_SOLUTION | Freq: Four times a day (QID) | RESPIRATORY_TRACT | Status: DC
Start: 2016-03-18 — End: 2016-03-18

## 2016-03-18 MED ORDER — INSULIN DETEMIR 100 UNIT/ML ~~LOC~~ SOLN
10.0000 [IU] | Freq: Every day | SUBCUTANEOUS | Status: DC
  Administered 2016-03-18 – 2016-03-19 (×2): 10 [IU] via SUBCUTANEOUS
  Filled 2016-03-18 (×3): qty 0.1

## 2016-03-18 MED ORDER — AZITHROMYCIN 250 MG PO TABS: ORAL_TABLET | ORAL | Status: DC

## 2016-03-18 MED ORDER — HYDROCODONE-ACETAMINOPHEN 5-325 MG PO TABS
1.0000 | ORAL_TABLET | ORAL | Status: DC | PRN
  Administered 2016-03-18 – 2016-03-19 (×4): 2 via ORAL
  Filled 2016-03-18 (×4): qty 2

## 2016-03-18 MED ORDER — ACETAMINOPHEN 500 MG PO TABS
1000.0000 mg | ORAL_TABLET | Freq: Once | ORAL | Status: AC
  Administered 2016-03-18: 1000 mg via ORAL

## 2016-03-18 MED ORDER — IPRATROPIUM-ALBUTEROL 0.5-2.5 (3) MG/3ML IN SOLN
3.0000 mL | Freq: Once | RESPIRATORY_TRACT | Status: AC
  Administered 2016-03-18: 3 mL via RESPIRATORY_TRACT

## 2016-03-18 MED ORDER — PNEUMOCOCCAL VAC POLYVALENT 25 MCG/0.5ML IJ INJ
0.5000 mL | INJECTION | INTRAMUSCULAR | Status: AC
  Administered 2016-03-20: 0.5 mL via INTRAMUSCULAR
  Filled 2016-03-18: qty 0.5

## 2016-03-18 MED ORDER — DOCUSATE SODIUM 100 MG PO CAPS
100.0000 mg | ORAL_CAPSULE | Freq: Two times a day (BID) | ORAL | Status: DC
  Administered 2016-03-18 – 2016-03-19 (×2): 100 mg via ORAL
  Filled 2016-03-18 (×2): qty 1

## 2016-03-18 MED ORDER — ALBUTEROL SULFATE HFA 108 (90 BASE) MCG/ACT IN AERS: 2.0000 | INHALATION_SPRAY | RESPIRATORY_TRACT | Status: DC | PRN

## 2016-03-18 MED ORDER — SODIUM CHLORIDE 0.9 % IV BOLUS (SEPSIS)
1000.0000 mL | Freq: Once | INTRAVENOUS | Status: AC
  Administered 2016-03-18: 1000 mL via INTRAVENOUS

## 2016-03-18 MED ORDER — PREGABALIN 75 MG PO CAPS
200.0000 mg | ORAL_CAPSULE | Freq: Two times a day (BID) | ORAL | Status: DC
  Administered 2016-03-18 – 2016-03-20 (×4): 200 mg via ORAL
  Filled 2016-03-18 (×4): qty 1

## 2016-03-18 MED ORDER — ENOXAPARIN SODIUM 40 MG/0.4ML ~~LOC~~ SOLN
40.0000 mg | SUBCUTANEOUS | Status: DC
  Administered 2016-03-18 – 2016-03-19 (×2): 40 mg via SUBCUTANEOUS
  Filled 2016-03-18 (×2): qty 0.4

## 2016-03-18 MED ORDER — ONDANSETRON HCL 4 MG/2ML IJ SOLN
4.0000 mg | Freq: Four times a day (QID) | INTRAMUSCULAR | Status: DC | PRN
  Administered 2016-03-19 (×2): 4 mg via INTRAVENOUS
  Filled 2016-03-18 (×2): qty 2

## 2016-03-18 MED ORDER — TRAZODONE HCL 50 MG PO TABS
25.0000 mg | ORAL_TABLET | Freq: Every evening | ORAL | Status: DC | PRN
  Filled 2016-03-18: qty 2

## 2016-03-18 MED ORDER — IPRATROPIUM-ALBUTEROL 0.5-2.5 (3) MG/3ML IN SOLN
RESPIRATORY_TRACT | Status: AC
  Administered 2016-03-18: 3 mL via RESPIRATORY_TRACT
  Filled 2016-03-18: qty 3

## 2016-03-18 MED ORDER — DOCUSATE SODIUM 100 MG PO CAPS: 200.0000 mg | ORAL_CAPSULE | Freq: Two times a day (BID) | ORAL | Status: DC

## 2016-03-18 MED ORDER — ARIPIPRAZOLE 5 MG PO TABS
10.0000 mg | ORAL_TABLET | Freq: Every day | ORAL | Status: DC
  Administered 2016-03-19 – 2016-03-20 (×2): 10 mg via ORAL
  Filled 2016-03-18 (×4): qty 2

## 2016-03-18 MED ORDER — POTASSIUM CHLORIDE CRYS ER 20 MEQ PO TBCR
40.0000 meq | EXTENDED_RELEASE_TABLET | ORAL | Status: AC
  Administered 2016-03-18 (×2): 40 meq via ORAL
  Filled 2016-03-18 (×2): qty 2

## 2016-03-18 MED ORDER — LEVALBUTEROL HCL 1.25 MG/0.5ML IN NEBU
1.2500 mg | INHALATION_SOLUTION | Freq: Four times a day (QID) | RESPIRATORY_TRACT | Status: DC
  Administered 2016-03-18: 1.25 mg via RESPIRATORY_TRACT
  Filled 2016-03-18: qty 0.5

## 2016-03-18 MED ORDER — ACETAMINOPHEN 500 MG PO TABS
ORAL_TABLET | ORAL | Status: AC
  Administered 2016-03-18: 1000 mg via ORAL
  Filled 2016-03-18: qty 2

## 2016-03-18 MED ORDER — PANTOPRAZOLE SODIUM 40 MG PO TBEC
40.0000 mg | DELAYED_RELEASE_TABLET | Freq: Every day | ORAL | Status: DC
  Administered 2016-03-18 – 2016-03-20 (×3): 40 mg via ORAL
  Filled 2016-03-18 (×3): qty 1

## 2016-03-18 MED ORDER — HYDROCHLOROTHIAZIDE 25 MG PO TABS
25.0000 mg | ORAL_TABLET | Freq: Every day | ORAL | Status: DC
Start: 2016-03-18 — End: 2016-03-20
  Administered 2016-03-18 – 2016-03-20 (×3): 25 mg via ORAL
  Filled 2016-03-18 (×3): qty 1

## 2016-03-18 MED ORDER — METHYLPREDNISOLONE SODIUM SUCC 125 MG IJ SOLR
60.0000 mg | Freq: Four times a day (QID) | INTRAMUSCULAR | Status: DC
  Administered 2016-03-18: 60 mg via INTRAVENOUS
  Administered 2016-03-18 – 2016-03-19 (×2): 125 mg via INTRAVENOUS
  Administered 2016-03-19: 60 mg via INTRAVENOUS
  Filled 2016-03-18 (×4): qty 2

## 2016-03-18 MED ORDER — PREDNISONE 20 MG PO TABS
40.0000 mg | ORAL_TABLET | ORAL | Status: AC
  Administered 2016-03-18: 40 mg via ORAL
  Filled 2016-03-18: qty 2

## 2016-03-18 MED ORDER — SUMATRIPTAN SUCCINATE 50 MG PO TABS
25.0000 mg | ORAL_TABLET | ORAL | Status: DC | PRN
  Filled 2016-03-18: qty 1

## 2016-03-18 MED ORDER — INSULIN ASPART 100 UNIT/ML ~~LOC~~ SOLN
0.0000 [IU] | Freq: Three times a day (TID) | SUBCUTANEOUS | Status: DC
  Administered 2016-03-19 (×3): 3 [IU] via SUBCUTANEOUS
  Administered 2016-03-20: 2 [IU] via SUBCUTANEOUS
  Filled 2016-03-18 (×2): qty 3
  Filled 2016-03-18 (×2): qty 2

## 2016-03-18 MED ORDER — PREDNISONE 20 MG PO TABS: 40.0000 mg | ORAL_TABLET | Freq: Every day | ORAL | Status: DC

## 2016-03-18 MED ORDER — ACETAMINOPHEN 325 MG PO TABS: 650.0000 mg | ORAL_TABLET | Freq: Four times a day (QID) | ORAL | Status: DC | PRN

## 2016-03-18 MED ORDER — ALBUTEROL SULFATE (2.5 MG/3ML) 0.083% IN NEBU
5.0000 mg | INHALATION_SOLUTION | Freq: Once | RESPIRATORY_TRACT | Status: AC
  Administered 2016-03-18: 5 mg via RESPIRATORY_TRACT
  Filled 2016-03-18: qty 6

## 2016-03-18 MED ORDER — LEVOFLOXACIN IN D5W 750 MG/150ML IV SOLN
750.0000 mg | Freq: Once | INTRAVENOUS | Status: AC
  Administered 2016-03-18: 750 mg via INTRAVENOUS
  Filled 2016-03-18: qty 150

## 2016-03-18 MED ORDER — METFORMIN HCL 500 MG PO TABS
1000.0000 mg | ORAL_TABLET | Freq: Two times a day (BID) | ORAL | Status: DC
  Administered 2016-03-19 – 2016-03-20 (×3): 1000 mg via ORAL
  Filled 2016-03-18 (×3): qty 2

## 2016-03-18 MED ORDER — BIOTIN 2.5 MG PO TABS: ORAL_TABLET | Freq: Every day | ORAL | Status: DC

## 2016-03-18 MED ORDER — KETOROLAC TROMETHAMINE 30 MG/ML IJ SOLN
15.0000 mg | Freq: Once | INTRAMUSCULAR | Status: AC
  Administered 2016-03-18: 15 mg via INTRAVENOUS
  Filled 2016-03-18 (×2): qty 1

## 2016-03-18 MED ORDER — ONDANSETRON HCL 4 MG PO TABS: 4.0000 mg | ORAL_TABLET | Freq: Four times a day (QID) | ORAL | Status: DC | PRN

## 2016-03-18 MED ORDER — ACETAMINOPHEN 650 MG RE SUPP: 650.0000 mg | Freq: Four times a day (QID) | RECTAL | Status: DC | PRN

## 2016-03-18 MED ORDER — ISOSORBIDE MONONITRATE ER 30 MG PO TB24
30.0000 mg | ORAL_TABLET | Freq: Every day | ORAL | Status: DC
  Administered 2016-03-18 – 2016-03-20 (×3): 30 mg via ORAL
  Filled 2016-03-18 (×3): qty 1

## 2016-03-18 MED ORDER — METOPROLOL SUCCINATE ER 50 MG PO TB24
200.0000 mg | ORAL_TABLET | Freq: Every day | ORAL | Status: DC
  Administered 2016-03-18: 200 mg via ORAL
  Filled 2016-03-18 (×2): qty 4

## 2016-03-18 MED ORDER — ALPRAZOLAM 1 MG PO TABS
1.0000 mg | ORAL_TABLET | Freq: Every evening | ORAL | Status: DC | PRN
  Administered 2016-03-18: 1 mg via ORAL
  Filled 2016-03-18: qty 1

## 2016-03-18 MED ORDER — BISACODYL 5 MG PO TBEC: 5.0000 mg | DELAYED_RELEASE_TABLET | Freq: Every day | ORAL | Status: DC | PRN

## 2016-03-18 MED ORDER — SENNA 8.6 MG PO TABS: 2.0000 | ORAL_TABLET | Freq: Two times a day (BID) | ORAL | Status: DC

## 2016-03-18 MED ORDER — CYCLOSPORINE 0.05 % OP EMUL
1.0000 [drp] | Freq: Two times a day (BID) | OPHTHALMIC | Status: DC
  Administered 2016-03-18: 8 [drp] via OPHTHALMIC
  Administered 2016-03-19 – 2016-03-20 (×3): 1 [drp] via OPHTHALMIC
  Filled 2016-03-18 (×6): qty 1

## 2016-03-18 MED ORDER — SODIUM CHLORIDE 0.9 % IV SOLN: INTRAVENOUS | Status: DC

## 2016-03-18 MED ORDER — ALBUTEROL SULFATE 1.25 MG/3ML IN NEBU: 1.0000 | INHALATION_SOLUTION | Freq: Four times a day (QID) | RESPIRATORY_TRACT | Status: DC | PRN

## 2016-03-18 MED ORDER — DULOXETINE HCL 60 MG PO CPEP
60.0000 mg | ORAL_CAPSULE | Freq: Every day | ORAL | Status: DC
  Administered 2016-03-19 – 2016-03-20 (×2): 60 mg via ORAL
  Filled 2016-03-18 (×4): qty 1

## 2016-03-18 MED ORDER — CANAGLIFLOZIN 300 MG PO TABS
300.0000 mg | ORAL_TABLET | Freq: Every day | ORAL | Status: DC
Start: 2016-03-19 — End: 2016-03-18

## 2016-03-18 MED ORDER — LEVOFLOXACIN IN D5W 750 MG/150ML IV SOLN
750.0000 mg | INTRAVENOUS | Status: DC
  Filled 2016-03-18: qty 150

## 2016-03-18 MED ORDER — FLUTICASONE PROPIONATE 50 MCG/ACT NA SUSP
1.0000 | Freq: Every day | NASAL | Status: DC
  Administered 2016-03-19 – 2016-03-20 (×2): 1 via NASAL
  Filled 2016-03-18: qty 16

## 2016-03-18 NOTE — Progress Notes (Signed)
PHARMACIST - PHYSICIAN ORDER COMMUNICATION  CONCERNING: P&T Medication Policy on Herbal Medications  DESCRIPTION:  This patient's order for:  biotin  has been noted.  This product(s) is classified as an "herbal" or natural product. Due to a lack of definitive safety studies or FDA approval, nonstandard manufacturing practices, plus the potential risk of unknown drug-drug interactions while on inpatient medications, the Pharmacy and Therapeutics Committee does not permit the use of "herbal" or natural products of this type within [facility].   ACTION TAKEN: The pharmacy department is unable to verify this order at this time and your patient has been informed of this safety policy. Please reevaluate patient's clinical condition at discharge and address if the herbal or natural product(s) should be resumed at that time.   Clarisa Schoolsrystal Islah Eve, PharmD Clinical Pharmacist 03/18/2016

## 2016-03-18 NOTE — ED Provider Notes (Signed)
[facility] Emergency Department Provider Note  ____________________________________________  Time seen: 10:10 AM  I have reviewed the triage vital signs and the nursing notes.   HISTORY  Chief Complaint URI    HPI MONTINE HIGHT is a 50 y.o. female with a history of hypertension and diabetes who complains of fever for the past 3 days associated with productive cough diffuse shortness of breath and wheezing which has a history of asthma but has not tried taking her albuterol inhaler over the last couple of days while she's been symptomatic. Also has body aches rated no chest pain abdominal pain vomiting or diarrhea. Follows up with Darreld Mclean as primary care. No pleuritic component, no recent travel, hospitalization surgery or history of DVT.     Past Medical History  Diagnosis Date  . Diabetes mellitus without complication (HCC)   . Hypertension     no meds currently  . Arthritis     knees, shoulder, upper back  . Thyroid nodule     bilateral and goiter  . GERD (gastroesophageal reflux disease)   . Headache     migraines - 5x/mo  . Fibromyalgia   . Wears contact lenses   . Wears dentures     partial upper  . Motion sickness     ships  . Asthma      There are no active problems to display for this patient.    Past Surgical History  Procedure Laterality Date  . Abdominal surgery      laparoscopy x4 with lysis of adhesions  . Ectopic pregnancy surgery    . Joint replacement Right 12/16/12    knee- medial - makoplasty  . Knee arthroscopy Right     partial medial and lateral meniscectomies  . Hernia repair    . Rotator cuff repair Left   . Abdominal hysterectomy    . Cesarean section    . Knee arthroscopy Right 10/06/2015    Procedure: RIGHT KNEE ARTHROSCOPY WITH DEBRIDEMENT;  Surgeon: Erin Sons, MD;  Location: [facility];  Service: Orthopedics;  Laterality: Right;  Diabetic - insulin and oral meds  . Appendectomy        Current Outpatient Rx  Name  Route  Sig  Dispense  Refill  . albuterol (ACCUNEB) 1.25 MG/3ML nebulizer solution   Nebulization   Take 1 ampule by nebulization every 6 (six) hours as needed for wheezing.         [name] Kitchen ALPRAZolam (XANAX) 1 MG tablet   Oral   Take 1 mg by mouth at bedtime as needed for anxiety or sleep.         [name] Kitchen BIOTIN PO   Oral   Take by mouth daily.         . canagliflozin (INVOKANA) 300 MG TABS tablet   Oral   Take 300 mg by mouth daily before breakfast.         . cycloSPORINE (RESTASIS) 0.05 % ophthalmic emulsion      1 drop 2 (two) times daily.         . flunisolide (NASALIDE) 25 MCG/ACT (0.025%) SOLN   Nasal   Place 2 sprays into the nose daily as needed.         . hydrochlorothiazide (HYDRODIURIL) 25 MG tablet   Oral   Take 1 tablet (25 mg total) by mouth daily.   30 tablet   1   . insulin detemir (LEVEMIR) 100 UNIT/ML injection   Subcutaneous   Inject 10  Units into the skin at bedtime.         . isosorbide mononitrate (IMDUR) 30 MG 24 hr tablet   Oral   Take 30 mg by mouth daily.         . metFORMIN (GLUCOPHAGE) 500 MG tablet   Oral   Take 1,000 mg by mouth 2 (two) times daily with a meal.          . metoprolol (TOPROL-XL) 200 MG 24 hr tablet   Oral   Take 200 mg by mouth daily.         [name] Kitchen omeprazole (PRILOSEC) 40 MG capsule   Oral   Take 40 mg by mouth daily. 1 hr before supper         . pregabalin (LYRICA) 200 MG capsule   Oral   Take 200 mg by mouth 2 (two) times daily.         . propranolol ER (INDERAL LA) 80 MG 24 hr capsule   Oral   Take 80 mg by mouth as needed.         . SUMAtriptan (IMITREX) 25 MG tablet   Oral   Take 25 mg by mouth every 2 (two) hours as needed for migraine. May repeat in 2 hours if headache persists or recurs.         [name] Kitchen tiZANidine (ZANAFLEX) 4 MG tablet   Oral   Take 4 mg by mouth every 6 (six) hours as needed for muscle spasms.         . ARIPiprazole (ABILIFY) 10 MG  tablet   Oral   Take 10 mg by mouth daily as needed.         . docusate sodium (COLACE) 100 MG capsule   Oral   Take 2 capsules (200 mg total) by mouth 2 (two) times daily.   120 capsule   0   . DULoxetine (CYMBALTA) 60 MG capsule   Oral   Take 60 mg by mouth daily. AM         . polyethylene glycol powder (GLYCOLAX/MIRALAX) powder      2 cap fulls in a full glass of water, three times a day, for 5 days.   255 g   0   . senna (SENOKOT) 8.6 MG TABS tablet   Oral   Take 2 tablets (17.2 mg total) by mouth 2 (two) times daily.   120 each   0      Allergies Penicillins and Ace inhibitors   No family history on file.  Social History Social History  Substance Use Topics  . Smoking status: Never Smoker   . Smokeless tobacco: Never Used  . Alcohol Use: No    Review of Systems  Constitutional:   Positive fever and night sweats ENT:   No sore throat. No rhinorrhea. Cardiovascular:   No chest pain. Respiratory:   Positive shortness of breath and productive cough. Gastrointestinal:   Negative for abdominal pain, vomiting and diarrhea.  Genitourinary:   Negative for dysuria or difficulty urinating. Musculoskeletal:   Negative for focal pain or swelling Neurological:   Negative for headaches 10-point ROS otherwise negative.  ____________________________________________   PHYSICAL EXAM:  VITAL SIGNS: ED Triage Vitals  Enc Vitals Group     BP 03/18/16 0913 138/90 mmHg     Pulse Rate 03/18/16 0913 93     Resp 03/18/16 0913 18     Temp 03/18/16 0913 99.3 F (37.4 C)     Temp Source 03/18/16 0913  Oral     SpO2 03/18/16 0913 95 %     Weight 03/18/16 0913 190 lb (86.183 kg)     Height 03/18/16 0913 5\' 5"  (1.651 m)     Head Cir --      Peak Flow --      Pain Score 03/18/16 0904 6     Pain Loc --      Pain Edu? --      Excl. in GC? --     Vital signs reviewed, nursing assessments reviewed.   Constitutional:   Alert and oriented. Well appearing and in no  distress. Eyes:   No scleral icterus. No conjunctival pallor. PERRL. EOMI.  No nystagmus. ENT   Head:   Normocephalic and atraumatic.   Nose:   No congestion/rhinnorhea. No septal hematoma   Mouth/Throat:   MMM, no pharyngeal erythema. No peritonsillar mass.    Neck:   No stridor. No SubQ emphysema. No meningismus. Hematological/Lymphatic/Immunilogical:   No cervical lymphadenopathy. Cardiovascular:   RRR. Symmetric bilateral radial and DP pulses.  No murmurs.  Respiratory:   Diffuse expiratory wheezing, no focal crackles. Mildly prolonged expiratory phase. Gastrointestinal:   Soft and nontender. Non distended. There is no CVA tenderness.  No rebound, rigidity, or guarding. Genitourinary:   deferred Musculoskeletal:   Nontender with normal range of motion in all extremities. No joint effusions.  No lower extremity tenderness.  No edema. Neurologic:   Normal speech and language.  CN 2-10 normal. Motor grossly intact. No gross focal neurologic deficits are appreciated.  Skin:    Skin is warm, dry and intact. No rash noted.  No petechiae, purpura, or bullae.  ____________________________________________    LABS (pertinent positives/negatives) (all labs ordered are listed, but only abnormal results are displayed) Labs Reviewed  BASIC METABOLIC PANEL - Abnormal; Notable for the following:    Potassium 3.0 (*)    Glucose, Bld 114 (*)    All other components within normal limits  CBC - Abnormal; Notable for the following:    RDW 15.0 (*)    All other components within normal limits  RAPID INFLUENZA A&B ANTIGENS ([facility] ONLY)  CULTURE, GROUP A STREP ([facility])  CBC WITH DIFFERENTIAL/PLATELET  POCT RAPID STREP A   ____________________________________________   EKG    ____________________________________________    RADIOLOGY Chest x-ray shows interstitial infiltrate consistent with pneumonia  ____________________________________________   PROCEDURES  CRITICAL  CARE Performed by: Scotty Court, Jacqulyn Barresi   Total critical care time: 35 minutes  Critical care time was exclusive of separately billable procedures and treating other patients.  Critical care was necessary to treat or prevent imminent or life-threatening deterioration.  Critical care was time spent personally by me on the following activities: development of treatment plan with patient and/or surrogate as well as nursing, discussions with consultants, evaluation of patient's response to treatment, examination of patient, obtaining history from patient or surrogate, ordering and performing treatments and interventions, ordering and review of laboratory studies, ordering and review of radiographic studies, pulse oximetry and re-evaluation of patient's condition. ____________________________________________   INITIAL IMPRESSION / ASSESSMENT AND PLAN / ED COURSE  Pertinent labs & imaging results that were available during my care of the patient were reviewed by me and considered in my medical decision making (see chart for details).  Patient presents with asthma exacerbation with evidence of bronchospasm on physical exam. With her history of diabetes and fever and productive cough I will treat with antibiotics but will check chest x-ray to further risk stratify. Eitel  signs are unremarkable currently, we'll give steroids and bronchodilators and reassess. Low suspicion for sepsis PE carditis mediastinitis.   ----------------------------------------- 12:33 PM on 03/18/2016 ----------------------------------------- Patient has begun developing fever again associated with a rise in her heart rate, which is also occurred after being given bronchodilators. We'll give IV fluids and antipyretics and continue to monitor. She is not in distress and should be suitable for outpatient follow-up with oral antibiotics if fever and tachycardia are manageable.  ----------------------------------------- 2:50 PM on  03/18/2016 -----------------------------------------  Despite time and antipyretics and IV fluids, patient still tachycardic to 1:30 and tachypneic despite good oxygen saturation and reduced fever to 99.2. Repeat lung auscultation reveals persistent diffuse wheezing. At this point the patient is not likely improving with treatment as expected, so discussed the case with the hospitalist for further evaluation and management. With worsening tachycardia and tachypnea and fever, etc. indicative of early sepsis with her pneumonia.     ____________________________________________   FINAL CLINICAL IMPRESSION(S) / ED DIAGNOSES  Final diagnoses:  Rectal pain  Fecal impaction (HCC)  Other constipation  Sepsis     Portions of this note were generated with dragon dictation software. Dictation errors may occur despite best attempts at proofreading.   Sharman CheekPhillip Gayl Ivanoff, MD 03/18/16 1451

## 2016-03-18 NOTE — ED Notes (Signed)
Pt moved to room 18  Report to [employer]R Corporation[employer] RN

## 2016-03-18 NOTE — H&P (Signed)
[facility] [place] [facility]   PATIENT NAME: Amanda [name]    MR#:  [id number]  DATE OF BIRTH:  01-03-1966  DATE OF ADMISSION:  03/18/2016  PRIMARY CARE PHYSICIAN: Leanna Sato, MD   REQUESTING/REFERRING PHYSICIAN: Dr. Scotty Court  CHIEF COMPLAINT: Shortness of breath    Chief Complaint  Patient presents with  . URI    HISTORY OF PRESENT ILLNESS:  Amanda [name]  is a 50 y.o. female with a known history of Hypertension, diabetes mellitus complaints of shortness of breath with wheezing for the past 3 days and fever up to 104 Fahrenheit since yesterday. Also having cough  With green phlegm. Patient received series of nebulizers in the emergency room, steroids without improvement of wheezing, she developed tachycardia with heart rate up to 1 20 bpm.02 sats 93-94% on RA. Patient  Also developed fever associated tachycardia. 7. Because of that we are asked to admit that patient.  PAST MEDICAL HISTORY:   Past Medical History  Diagnosis Date  . Diabetes mellitus without complication (HCC)   . Hypertension     no meds currently  . Arthritis     knees, shoulder, upper back  . Thyroid nodule     bilateral and goiter  . GERD (gastroesophageal reflux disease)   . Headache     migraines - 5x/mo  . Fibromyalgia   . Wears contact lenses   . Wears dentures     partial upper  . Motion sickness     ships  . Asthma     PAST SURGICAL HISTOIRY:   Past Surgical History  Procedure Laterality Date  . Abdominal surgery      laparoscopy x4 with lysis of adhesions  . Ectopic pregnancy surgery    . Joint replacement Right 12/16/12    knee- medial - makoplasty  . Knee arthroscopy Right     partial medial and lateral meniscectomies  . Hernia repair    . Rotator cuff repair Left   . Abdominal hysterectomy    . Cesarean section    . Knee arthroscopy Right 10/06/2015    Procedure: RIGHT KNEE ARTHROSCOPY WITH DEBRIDEMENT;  Surgeon: Erin Sons, MD;  Location:  [facility];  Service: Orthopedics;  Laterality: Right;  Diabetic - insulin and oral meds  . Appendectomy      SOCIAL HISTORY:   Social History  Substance Use Topics  . Smoking status: Never Smoker   . Smokeless tobacco: Never Used  . Alcohol Use: No    FAMILY HISTORY:  No family history on file.  DRUG ALLERGIES:   Allergies  Allergen Reactions  . Penicillins Anaphylaxis    Has patient had a PCN reaction causing immediate rash, facial/tongue/throat swelling, SOB or lightheadedness with hypotension: Yes Has patient had a PCN reaction causing severe rash involving mucus membranes or skin necrosis: No Has patient had a PCN reaction that required hospitalization No Has patient had a PCN reaction occurring within the last 10 years: No If all of the above answers are "NO", then may proceed with Cephalosporin use.   . Ace Inhibitors Swelling    REVIEW OF SYSTEMS:  CONSTITUTIONAL: No fever, fatigue or weakness.  EYES: No blurred or double vision.  EARS, NOSE, AND THROAT: No tinnitus or ear pain.  RESPIRATORY: No cough, shortness of breath, wheezing or hemoptysis.  CARDIOVASCULAR: No chest pain, orthopnea, edema.  GASTROINTESTINAL: No nausea, vomiting, diarrhea or abdominal pain.  GENITOURINARY: No dysuria, hematuria.  ENDOCRINE: No polyuria, nocturia,  HEMATOLOGY: No  anemia, easy bruising or bleeding SKIN: No rash or lesion. MUSCULOSKELETAL: No joint pain or arthritis.   NEUROLOGIC: No tingling, numbness, weakness.  PSYCHIATRY: No anxiety or depression.   MEDICATIONS AT HOME:   Prior to Admission medications   Medication Sig Start Date End Date Taking? Authorizing Provider  ALPRAZolam Prudy Feeler) 1 MG tablet Take 1 mg by mouth at bedtime as needed for anxiety or sleep.   Yes Historical Provider, MD  BIOTIN PO Take by mouth daily.   Yes Historical Provider, MD  canagliflozin (INVOKANA) 300 MG TABS tablet Take 300 mg by mouth daily before breakfast.   Yes Historical  Provider, MD  cycloSPORINE (RESTASIS) 0.05 % ophthalmic emulsion 1 drop 2 (two) times daily.   Yes Historical Provider, MD  flunisolide (NASALIDE) 25 MCG/ACT (0.025%) SOLN Place 2 sprays into the nose daily as needed.   Yes Historical Provider, MD  hydrochlorothiazide (HYDRODIURIL) 25 MG tablet Take 1 tablet (25 mg total) by mouth daily. 12/01/15  Yes Emily Filbert, MD  insulin detemir (LEVEMIR) 100 UNIT/ML injection Inject 10 Units into the skin at bedtime.   Yes Historical Provider, MD  isosorbide mononitrate (IMDUR) 30 MG 24 hr tablet Take 30 mg by mouth daily.   Yes Historical Provider, MD  metFORMIN (GLUCOPHAGE) 500 MG tablet Take 1,000 mg by mouth 2 (two) times daily with a meal.    Yes Historical Provider, MD  metoprolol (TOPROL-XL) 200 MG 24 hr tablet Take 200 mg by mouth daily.   Yes Historical Provider, MD  omeprazole (PRILOSEC) 40 MG capsule Take 40 mg by mouth daily. 1 hr before supper   Yes Historical Provider, MD  pregabalin (LYRICA) 200 MG capsule Take 200 mg by mouth 2 (two) times daily.   Yes Historical Provider, MD  propranolol ER (INDERAL LA) 80 MG 24 hr capsule Take 80 mg by mouth as needed.   Yes Historical Provider, MD  SUMAtriptan (IMITREX) 25 MG tablet Take 25 mg by mouth every 2 (two) hours as needed for migraine. May repeat in 2 hours if headache persists or recurs.   Yes Historical Provider, MD  tiZANidine (ZANAFLEX) 4 MG tablet Take 4 mg by mouth every 6 (six) hours as needed for muscle spasms.   Yes Historical Provider, MD  albuterol (ACCUNEB) 1.25 MG/3ML nebulizer solution Take 3 mLs (1.25 mg total) by nebulization every 6 (six) hours as needed for wheezing. 03/18/16   Sharman Cheek, MD  albuterol (PROVENTIL HFA) 108 (90 Base) MCG/ACT inhaler Inhale 2 puffs into the lungs every 4 (four) hours as needed for wheezing or shortness of breath. 03/18/16   Sharman Cheek, MD  ARIPiprazole (ABILIFY) 10 MG tablet Take 10 mg by mouth daily as needed.    Historical Provider,  MD  azithromycin (ZITHROMAX Z-PAK) 250 MG tablet Take 2 tablets (500 mg) on  Day 1,  followed by 1 tablet (250 mg) once daily on Days 2 through 5. 03/18/16   Sharman Cheek, MD  DULoxetine (CYMBALTA) 60 MG capsule Take 60 mg by mouth daily. AM    Historical Provider, MD  predniSONE (DELTASONE) 20 MG tablet Take 2 tablets (40 mg total) by mouth daily. 03/18/16   Sharman Cheek, MD      VITAL SIGNS:  Blood pressure 120/76, pulse 114, temperature 99.1 F (37.3 C), temperature source Oral, resp. rate 20, height 5\' 5"  (1.651 m), weight 86.183 kg (190 lb), SpO2 94 %.  PHYSICAL EXAMINATION:  GENERAL:  50 y.o.-year-old patient lying in the bed with no acute  distress.  EYES: Pupils equal, round, reactive to light and accommodation. No scleral icterus. Extraocular muscles intact.  HEENT: Head atraumatic, normocephalic. Oropharynx and nasopharynx clear.  NECK:  Supple, no jugular venous distention. No thyroid enlargement, no tenderness.  LUNGS: Bilateral expiratory wheezing in all lung fields. Not using accessory  muscles of respiration.  CARDIOVASCULAR: S1, S2  Tachycardic.No murmurs, rubs, or gallops.  ABDOMEN: Soft, nontender, nondistended. Bowel sounds present. No organomegaly or mass.  EXTREMITIES: No pedal edema, cyanosis, or clubbing.  NEUROLOGIC: Cranial nerves II through XII are intact. Muscle strength 5/5 in all extremities. Sensation intact. Gait not checked.  PSYCHIATRIC: The patient is alert and oriented x 3.  SKIN: No obvious rash, lesion, or ulcer.   LABORATORY PANEL:   CBC  Recent Labs Lab 03/18/16 1158  WBC 6.6  HGB 14.1  HCT 41.2  PLT 216   ------------------------------------------------------------------------------------------------------------------  Chemistries   Recent Labs Lab 03/18/16 1158  NA 139  K 3.0*  CL 104  CO2 26  GLUCOSE 114*  BUN 7  CREATININE 0.91  CALCIUM 9.1    ------------------------------------------------------------------------------------------------------------------  Cardiac Enzymes No results for input(s): TROPONINI in the last 168 hours. ------------------------------------------------------------------------------------------------------------------  RADIOLOGY:  Dg Chest 2 View  03/18/2016  CLINICAL DATA:  Wheezing, cough, fever, dyspnea, and chest pain for the past 3 days; history of asthma and diabetes EXAM: CHEST  2 VIEW COMPARISON:  Chest x-ray and chest CT scan of Jan 04, 2016 FINDINGS: Low lung volumes are low. The interstitial markings are coarse. There is mild thickening of the minor fissure. The cardiac silhouette is enlarged. The pulmonary vascularity is not engorged. There is a small pleural effusion blunting the posterior aspect of the right costophrenic angle. The trachea is midline. IMPRESSION: Hypoinflation may reflect increased work of breathing or may be voluntary. Chronically increased interstitial markings are more prominent today suspicious for mild interstitial edema or pneumonia. Small right pleural effusion which is new. Mild enlargement of the cardiac silhouette which may reflect cardiac decompensation in the appropriate clinical setting. Followup PA and lateral chest X-ray is recommended in 3-4 weeks following trial of antibiotic therapy to ensure resolution and exclude underlying malignancy or other processes. Electronically Signed   By: David  [place] M.D.   On: 03/18/2016 11:37    EKG:   Orders placed or performed during the hospital encounter of 01/04/16  . EKG 12-Lead  . EKG 12-Lead  . ED EKG within 10 minutes  . ED EKG within 10 minutes  . EKG  Sinus tachycardia with heart rate up to 1 30 bpm  IMPRESSION AND PLAN:   #1 acute respiratory failure with hypoxia secondary to asthma exacerbation and pneumonia. Patient says that she uses nebulizers as needed and her asthma is not severe at home but whenever she is  exposed to smoking and she gets or wheezing. Recently her son was smoking and she had to inhale the smoke. Continue oxygen, start Xopenex nebulizer because of tachycardia, continue Levaquin,IV solumedrol. 2. SVT secondary to respiratory distress: Patient is already on beta blockers. Monitor on telemetry, continue beta blockers, add Cardizem 30 mg every 6 hours when necessary if needed. #3 hypokalemia replace her potassium, check   Magnesium also  #4 diabetes mellitus type 2: continue Metformin, Lantus, sliding scale insulin coverage ,INVOKANA is not   Available in hospital, 5.. Depression: Patient is on Abilify, Cymbalta: Continue that. #6 history of migraines: Patient takes Imitrex,asneeded. GI and DVT prophylaxis All the records are reviewed and case discussed with ED  provider. Management plans discussed with the patient, family and they are in agreement.  CODE STATUS:full  TOTAL TIME TAKING CARE OF THIS PATIENT: 35inutes.    Katha HammingKONIDENA,Achilles Neville M.D on 03/18/2016 at 4:08 PM  Between 7am to 6pm - Pager - [phone]  After 6pm go to [email or web address] - password [email or web address] [facility]  [facility]  Office  [phone]  CC: Primary care physician; Leanna SatoMILES,LINDA M, MD  Note: This dictation was prepared with Dragon dictation along with smaller phrase technology. Any transcriptional errors that result from this process are unintentional.

## 2016-03-18 NOTE — Progress Notes (Signed)
Pharmacy Antibiotic Note  Amanda [name] is a 50 y.o. female admitted on 03/18/2016 with CAP. Pharmacy has been consulted for levofloxacin dosing.  Plan: Continue patient on levofloxacin 750 mg IV q24h based on renal function.  Height: 5\' 5"  (165.1 cm) Weight: 190 lb (86.183 kg) IBW/kg (Calculated) : 57  Temp (24hrs), Avg:99.6 F (37.6 C), Min:99.1 F (37.3 C), Max:100.2 F (37.9 C)   Recent Labs Lab 03/18/16 1158  WBC 6.6  CREATININE 0.91    Estimated Creatinine Clearance: 80.2 mL/min (by C-G formula based on Cr of 0.91).    Allergies  Allergen Reactions  . Penicillins Anaphylaxis    Has patient had a PCN reaction causing immediate rash, facial/tongue/throat swelling, SOB or lightheadedness with hypotension: Yes Has patient had a PCN reaction causing severe rash involving mucus membranes or skin necrosis: No Has patient had a PCN reaction that required hospitalization No Has patient had a PCN reaction occurring within the last 10 years: No If all of the above answers are "NO", then may proceed with Cephalosporin use.   . Ace Inhibitors Swelling    Antimicrobials this admission: 7/17 levofloxacin >>    Microbiology results: 7/17: Influenzae A&B Antigen: negative Culture, group A strep throat: pending  Thank you for allowing pharmacy to be a part of this patient's care.  Larosa Rhines G 03/18/2016 4:03 PM

## 2016-03-18 NOTE — ED Notes (Signed)
See triage note   States she developed some fever with SOB over the weekend  Has been taking OTC meds for fever and body aches  Increased SOB this am denies any chest pain

## 2016-03-18 NOTE — ED Notes (Signed)
Pt received to room 18 from [facility].  Pt states she has been having fever up to 104 for the last 3 days, taking acetaminophen for it.  Other symptoms include sore throat, body aches, head congestion, chest congestion (coughing up greenish-yellow sputum), and now wheezing.  Pt has history of asthma.

## 2016-03-18 NOTE — ED Notes (Signed)
Pt c/o cough with congestion and fever for the past 3-4 days ,states she has been taking OTC medicaitons without relief.

## 2016-03-18 NOTE — Progress Notes (Signed)
Pt admitted to 257. She is a/o. Med for general malaise with relief. Oriented to room and safety contract. Taking fluids and dinner well.

## 2016-03-19 DIAGNOSIS — J069 Acute upper respiratory infection, unspecified: Secondary | ICD-10-CM | POA: Diagnosis not present

## 2016-03-19 LAB — BASIC METABOLIC PANEL
Anion gap: 6 (ref 5–15)
BUN: 16 mg/dL (ref 6–20)
CO2: 25 mmol/L (ref 22–32)
Calcium: 9.3 mg/dL (ref 8.9–10.3)
Chloride: 107 mmol/L (ref 101–111)
Creatinine, Ser: 0.85 mg/dL (ref 0.44–1.00)
GFR calc Af Amer: >60 mL/min (ref >60)
GFR calc non Af Amer: >60 mL/min (ref >60)
Glucose, Bld: 186 mg/dL — ABNORMAL HIGH (ref 65–99)
Potassium: 5.2 mmol/L — ABNORMAL HIGH (ref 3.5–5.1)
Sodium: 138 mmol/L (ref 135–145)

## 2016-03-19 LAB — GLUCOSE, CAPILLARY
Glucose-Capillary: 168 mg/dL — ABNORMAL HIGH (ref 65–99)
Glucose-Capillary: 170 mg/dL — ABNORMAL HIGH (ref 65–99)
Glucose-Capillary: 199 mg/dL — ABNORMAL HIGH (ref 65–99)
Glucose-Capillary: 163 mg/dL — ABNORMAL HIGH (ref 65–99)

## 2016-03-19 LAB — CBC
HCT: 39.5 % (ref 35.0–47.0)
Hemoglobin: 13.3 g/dL (ref 12.0–16.0)
MCH: 30.3 pg (ref 26.0–34.0)
MCHC: 33.7 g/dL (ref 32.0–36.0)
MCV: 90.1 fL (ref 80.0–100.0)
Platelets: 210 10*3/uL (ref 150–440)
RBC: 4.39 MIL/uL (ref 3.80–5.20)
RDW: 15.3 % — ABNORMAL HIGH (ref 11.5–14.5)
WBC: 5.6 10*3/uL (ref 3.6–11.0)

## 2016-03-19 LAB — POTASSIUM: Potassium: 4.8 mmol/L (ref 3.5–5.1)

## 2016-03-19 MED ORDER — ALBUTEROL SULFATE (2.5 MG/3ML) 0.083% IN NEBU: 2.5000 mg | INHALATION_SOLUTION | Freq: Four times a day (QID) | RESPIRATORY_TRACT | Status: DC | PRN

## 2016-03-19 MED ORDER — LEVALBUTEROL HCL 1.25 MG/0.5ML IN NEBU
1.2500 mg | INHALATION_SOLUTION | Freq: Four times a day (QID) | RESPIRATORY_TRACT | Status: DC
  Administered 2016-03-19 (×2): 1.25 mg via RESPIRATORY_TRACT
  Filled 2016-03-19 (×2): qty 0.5

## 2016-03-19 MED ORDER — LEVOFLOXACIN 500 MG PO TABS
750.0000 mg | ORAL_TABLET | Freq: Every day | ORAL | Status: DC
  Administered 2016-03-19: 750 mg via ORAL
  Filled 2016-03-19: qty 2

## 2016-03-19 MED ORDER — FUROSEMIDE 10 MG/ML IJ SOLN
40.0000 mg | Freq: Once | INTRAMUSCULAR | Status: AC
  Administered 2016-03-19: 40 mg via INTRAVENOUS
  Filled 2016-03-19: qty 4

## 2016-03-19 MED ORDER — SENNOSIDES-DOCUSATE SODIUM 8.6-50 MG PO TABS
1.0000 | ORAL_TABLET | Freq: Two times a day (BID) | ORAL | Status: DC
  Administered 2016-03-19 – 2016-03-20 (×3): 1 via ORAL
  Filled 2016-03-19 (×3): qty 1

## 2016-03-19 MED ORDER — METHYLPREDNISOLONE SODIUM SUCC 125 MG IJ SOLR
60.0000 mg | Freq: Two times a day (BID) | INTRAMUSCULAR | Status: DC
  Administered 2016-03-19 – 2016-03-20 (×2): 60 mg via INTRAVENOUS
  Filled 2016-03-19 (×2): qty 2

## 2016-03-19 MED ORDER — ALBUTEROL SULFATE (2.5 MG/3ML) 0.083% IN NEBU
2.5000 mg | INHALATION_SOLUTION | Freq: Four times a day (QID) | RESPIRATORY_TRACT | Status: DC
  Administered 2016-03-19 – 2016-03-20 (×4): 2.5 mg via RESPIRATORY_TRACT
  Filled 2016-03-19 (×5): qty 3

## 2016-03-19 NOTE — Progress Notes (Signed)
[facility] - [place] at [address] [facility]   PATIENT NAME: Amanda [name]    MR#:  [id number]  DATE OF BIRTH:  1966/06/03  SUBJECTIVE:  CHIEF COMPLAINT:   Chief Complaint  Patient presents with  . URI  very sleepy. Mother at bedside, requests vicodin REVIEW OF SYSTEMS:  Review of Systems  Constitutional: Negative for fever, weight loss, malaise/fatigue and diaphoresis.  HENT: Negative for ear discharge, ear pain, hearing loss, nosebleeds, sore throat and tinnitus.   Eyes: Negative for blurred vision and pain.  Respiratory: Positive for shortness of breath. Negative for cough, hemoptysis and wheezing.   Cardiovascular: Negative for chest pain, palpitations, orthopnea and leg swelling.  Gastrointestinal: Negative for heartburn, nausea, vomiting, abdominal pain, diarrhea, constipation and blood in stool.  Genitourinary: Negative for dysuria, urgency and frequency.  Musculoskeletal: Negative for myalgias and back pain.  Skin: Negative for itching and rash.  Neurological: Negative for dizziness, tingling, tremors, focal weakness, seizures, weakness and headaches.  Psychiatric/Behavioral: Negative for depression. The patient is not nervous/anxious.     DRUG ALLERGIES:   Allergies  Allergen Reactions  . Penicillins Anaphylaxis    Has patient had a PCN reaction causing immediate rash, facial/tongue/throat swelling, SOB or lightheadedness with hypotension: Yes Has patient had a PCN reaction causing severe rash involving mucus membranes or skin necrosis: No Has patient had a PCN reaction that required hospitalization No Has patient had a PCN reaction occurring within the last 10 years: No If all of the above answers are "NO", then may proceed with Cephalosporin use.   . Ace Inhibitors Swelling   VITALS:  Blood pressure 103/59, pulse 60, temperature 97.8 F (36.6 C), temperature source Oral, resp. rate 18, height 5\' 5"  (1.651 m), weight 90.175 kg (198 lb 12.8 oz), SpO2 93  %. PHYSICAL EXAMINATION:  Physical Exam  Constitutional: She is oriented to person, place, and time and well-developed, well-nourished, and in no distress.  HENT:  Head: Normocephalic and atraumatic.  Eyes: Conjunctivae and EOM are normal. Pupils are equal, round, and reactive to light.  Neck: Normal range of motion. Neck supple. No tracheal deviation present. No thyromegaly present.  Cardiovascular: Normal rate, regular rhythm and normal heart sounds.   Pulmonary/Chest: Effort normal and breath sounds normal. No respiratory distress. She has no wheezes. She exhibits no tenderness.  Abdominal: Soft. Bowel sounds are normal. She exhibits no distension. There is no tenderness.  Musculoskeletal: Normal range of motion.  Neurological: She is alert and oriented to person, place, and time. No cranial nerve deficit.  Skin: Skin is warm and dry. No rash noted.  Psychiatric: Mood and affect normal.   LABORATORY PANEL:   CBC  Recent Labs Lab 03/19/16 0526  WBC 5.6  HGB 13.3  HCT 39.5  PLT 210   ------------------------------------------------------------------------------------------------------------------ Chemistries   Recent Labs Lab 03/18/16 1158 03/19/16 0526  NA 139 138  K 3.0* 5.2*  CL 104 107  CO2 26 25  GLUCOSE 114* 186*  BUN 7 16  CREATININE 0.91 0.85  CALCIUM 9.1 9.3  MG 1.8  --    RADIOLOGY:  No results found. ASSESSMENT AND PLAN:  50 y.o. female with a known history of Hypertension, diabetes mellitus complaints of shortness of breath with wheezing for the past 3 days and fever up to 104 Fahrenheit since yesterday.  1. Acute respiratory failure with hypoxia secondary to asthma exacerbation and pneumonia.  - Continue oxygen, Levaquin, IV solumedrol.  2. SVT secondary to respiratory distress: stopping b-blockers due to  bradycardia.  3. Hypokalemia: replete and recheck  4. diabetes mellitus type 2: continue Metformin, Lantus, sliding scale insulin  coverage  5. Depression: Patient is on Abilify, Cymbalta: Continue that.  6. history of migraines: Patient takes Imitrex,asneeded.  GI and DVT prophylaxis     All the records are reviewed and case discussed with Care Management/Social Worker. Management plans discussed with the patient, family and they are in agreement.  CODE STATUS: FULL CODE  TOTAL TIME TAKING CARE OF THIS PATIENT: 35 minutes.   More than 50% of the time was spent in counseling/coordination of care: YES  POSSIBLE D/C IN 1-2 DAYS, DEPENDING ON CLINICAL CONDITION.   [facility], Radwan Cowley M.D on 03/19/2016 at 2:49 PM  Between 7am to 6pm - Pager - [phone]  After 6pm go to [email or web address] - Social research officer, government  [facility] [place] [facility]  Office  [phone]  CC: Primary care physician; Leanna Sato, MD  Note: This dictation was prepared with Dragon dictation along with smaller phrase technology. Any transcriptional errors that result from this process are unintentional.

## 2016-03-19 NOTE — Progress Notes (Addendum)
Patient rested quietly this shift, reported pain once this morning which was addressed and relieved with PRN pain medication. HR SB-NSR, po metoprolol held per Dr. Sherryll BurgerShah. Other VSS, on RA; up in room independently. Potassium normalized at 4.8. Pt requested her home dose of xanaflex be restarted, MD made aware and to place orders.

## 2016-03-19 NOTE — Progress Notes (Signed)
AM metoprolol held due to bradycardia, pt HR 48-55, pt asymptomatic. Dr. Sherryll BurgerShah aware.

## 2016-03-19 NOTE — Plan of Care (Signed)
Problem: Safety: Goal: Ability to remain free from injury will improve Outcome: Progressing Patient is moderate fall risk, ambulating about room freely, safely and w/o difficulty.   Problem: Pain Managment: Goal: General experience of comfort will improve Outcome: Progressing Reported pain addressed with PRN pain medication. Pt reported relief.  Problem: Tissue Perfusion: Goal: Risk factors for ineffective tissue perfusion will decrease Outcome: Progressing SQ lovenox. Pt ambulating in room safely and w/o difficulty.  Problem: Fluid Volume: Goal: Ability to maintain a balanced intake and output will improve Outcome: Progressing One time dose of IV lasix given.

## 2016-03-20 ENCOUNTER — Inpatient Hospital Stay: Payer: Medicare Other

## 2016-03-20 DIAGNOSIS — J069 Acute upper respiratory infection, unspecified: Secondary | ICD-10-CM | POA: Diagnosis not present

## 2016-03-20 LAB — CBC
HCT: 37.7 % (ref 35.0–47.0)
Hemoglobin: 12.9 g/dL (ref 12.0–16.0)
MCH: 30.5 pg (ref 26.0–34.0)
MCHC: 34.2 g/dL (ref 32.0–36.0)
MCV: 89.3 fL (ref 80.0–100.0)
Platelets: 231 10*3/uL (ref 150–440)
RBC: 4.22 MIL/uL (ref 3.80–5.20)
RDW: 15.3 % — ABNORMAL HIGH (ref 11.5–14.5)
WBC: 12.7 10*3/uL — ABNORMAL HIGH (ref 3.6–11.0)

## 2016-03-20 LAB — BASIC METABOLIC PANEL
Anion gap: 8 (ref 5–15)
BUN: 18 mg/dL (ref 6–20)
CO2: 31 mmol/L (ref 22–32)
Calcium: 9.4 mg/dL (ref 8.9–10.3)
Chloride: 100 mmol/L — ABNORMAL LOW (ref 101–111)
Creatinine, Ser: 0.99 mg/dL (ref 0.44–1.00)
GFR calc Af Amer: >60 mL/min (ref >60)
GFR calc non Af Amer: >60 mL/min (ref >60)
Glucose, Bld: 151 mg/dL — ABNORMAL HIGH (ref 65–99)
Potassium: 4.2 mmol/L (ref 3.5–5.1)
Sodium: 139 mmol/L (ref 135–145)

## 2016-03-20 LAB — CULTURE, GROUP A STREP (THRC): Special Requests: NORMAL

## 2016-03-20 LAB — GLUCOSE, CAPILLARY
Glucose-Capillary: 131 mg/dL — ABNORMAL HIGH (ref 65–99)
Glucose-Capillary: 93 mg/dL (ref 65–99)

## 2016-03-20 MED ORDER — ALBUTEROL SULFATE HFA 108 (90 BASE) MCG/ACT IN AERS: 2.0000 | INHALATION_SPRAY | RESPIRATORY_TRACT | Status: DC | PRN

## 2016-03-20 MED ORDER — ALBUTEROL SULFATE 1.25 MG/3ML IN NEBU: 1.0000 | INHALATION_SOLUTION | Freq: Four times a day (QID) | RESPIRATORY_TRACT | Status: DC | PRN

## 2016-03-20 MED ORDER — PREDNISONE 10 MG PO TABS: 10.0000 mg | ORAL_TABLET | Freq: Every day | ORAL | Status: DC

## 2016-03-20 MED ORDER — LEVOFLOXACIN 750 MG PO TABS: 750.0000 mg | ORAL_TABLET | Freq: Every day | ORAL | Status: DC

## 2016-03-20 NOTE — Care Management CC44 (Signed)
Condition Code 44 Documentation Completed  Patient Details  Name: Amanda [name] MRN: [id number] Date of Birth: July 12, 1966   Condition Code 44 given:  Yes Patient signature on Condition Code 44 notice:  Yes Documentation of 2 MD's agreement:  Yes Code 44 added to claim:  Yes    Marily MemosLisa M Holley Wirt, RN 03/20/2016, 9:07 AM

## 2016-03-20 NOTE — Progress Notes (Signed)
Pt. Discharged to home via wc. Discharge instructions and medication regimen reviewed at bedside with patient. Pt. verbalizes understanding of instructions and medication regimen. Prescriptions sent with d/c papers, pt aware that levaquin already called into pharmacy. Patient assessment unchanged from this morning. TELE and IV discontinued per policy.  '

## 2016-03-20 NOTE — Care Management Obs Status (Signed)
MEDICARE OBSERVATION STATUS NOTIFICATION   Patient Details  Name: Amanda [name] MRN: [id number] Date of Birth: August 07, 1966   Medicare Observation Status Notification Given:  Yes    Marily MemosLisa M Lataisha Colan, RN 03/20/2016, 9:07 AM

## 2016-03-20 NOTE — Discharge Instructions (Signed)

## 2016-03-21 NOTE — Discharge Summary (Signed)
[facility] - [place] at [facility]   PATIENT NAME: Amanda [name]    MR#:  [id number]  DATE OF BIRTH:  Oct 12, 1965  DATE OF ADMISSION:  03/18/2016 ADMITTING PHYSICIAN: Katha Hamming, MD  DATE OF DISCHARGE: 03/20/2016  2:35 PM  PRIMARY CARE PHYSICIAN: Leanna Sato, MD    ADMISSION DIAGNOSIS:  CAP (community acquired pneumonia) [J18.9] Asthma exacerbation [J45.901]  DISCHARGE DIAGNOSIS:  Active Problems:   Acute respiratory failure with hypoxemia (HCC)  SECONDARY DIAGNOSIS:   Past Medical History  Diagnosis Date  . Diabetes mellitus without complication (HCC)   . Hypertension     no meds currently  . Arthritis     knees, shoulder, upper back  . Thyroid nodule     bilateral and goiter  . GERD (gastroesophageal reflux disease)   . Headache     migraines - 5x/mo  . Fibromyalgia   . Wears contact lenses   . Wears dentures     partial upper  . Motion sickness     ships  . Asthma     HOSPITAL COURSE:  50 y.o. female with a known history of Hypertension, diabetes mellitus admitted for shortness of breath with wheezing for the past 3 days and fever up to 104 Fahrenheit   1. Acute respiratory failure with hypoxia secondary to asthma exacerbation and pneumonia.  - resolved with treatment.  2. SVT secondary to respiratory distress: likely due to underlying lung disease, asymptomatic, and resolved  3. Hypokalemia: replete and resolved  DISCHARGE CONDITIONS:  stable CONSULTS OBTAINED:     DRUG ALLERGIES:   Allergies  Allergen Reactions  . Penicillins Anaphylaxis    Has patient had a PCN reaction causing immediate rash, facial/tongue/throat swelling, SOB or lightheadedness with hypotension: Yes Has patient had a PCN reaction causing severe rash involving mucus membranes or skin necrosis: No Has patient had a PCN reaction that required hospitalization No Has patient had a PCN reaction occurring within the last 10 years: No If all  of the above answers are "NO", then may proceed with Cephalosporin use.   . Ace Inhibitors Swelling    DISCHARGE MEDICATIONS:   Discharge Medication List as of 03/20/2016  2:10 PM    START taking these medications   Details  levofloxacin (LEVAQUIN) 750 MG tablet Take 1 tablet (750 mg total) by mouth daily., Starting 03/20/2016, Until Discontinued, Normal    albuterol (PROVENTIL HFA) 108 (90 Base) MCG/ACT inhaler Inhale 2 puffs into the lungs every 4 (four) hours as needed for wheezing or shortness of breath., Starting 03/18/2016, Until Discontinued, Print      CONTINUE these medications which have CHANGED   Details  predniSONE (DELTASONE) 10 MG tablet Take 1 tablet (10 mg total) by mouth daily. Start 40 mg once daily and taper 10 mg daily until done., Starting 03/20/2016, Until Discontinued, Print    albuterol (ACCUNEB) 1.25 MG/3ML nebulizer solution Take 3 mLs (1.25 mg total) by nebulization every 6 (six) hours as needed for wheezing., Starting 03/18/2016, Until Discontinued, Print      CONTINUE these medications which have NOT CHANGED   Details  ALPRAZolam (XANAX) 1 MG tablet Take 1 mg by mouth at bedtime as needed for anxiety or sleep., Until Discontinued, Historical Med    BIOTIN PO Take by mouth daily., Until Discontinued, Historical Med    canagliflozin (INVOKANA) 300 MG TABS tablet Take 300 mg by mouth daily before breakfast., Until Discontinued, Historical Med    cycloSPORINE (RESTASIS) 0.05 % ophthalmic emulsion 1  drop 2 (two) times daily., Until Discontinued, Historical Med    flunisolide (NASALIDE) 25 MCG/ACT (0.025%) SOLN Place 2 sprays into the nose daily as needed., Until Discontinued, Historical Med    hydrochlorothiazide (HYDRODIURIL) 25 MG tablet Take 1 tablet (25 mg total) by mouth daily., Starting 12/01/2015, Until Discontinued, Print    insulin detemir (LEVEMIR) 100 UNIT/ML injection Inject 10 Units into the skin at bedtime., Until Discontinued, Historical Med      isosorbide mononitrate (IMDUR) 30 MG 24 hr tablet Take 30 mg by mouth daily., Until Discontinued, Historical Med    metFORMIN (GLUCOPHAGE) 500 MG tablet Take 1,000 mg by mouth 2 (two) times daily with a meal. , Until Discontinued, Historical Med    metoprolol (TOPROL-XL) 200 MG 24 hr tablet Take 200 mg by mouth daily., Until Discontinued, Historical Med    omeprazole (PRILOSEC) 40 MG capsule Take 40 mg by mouth daily. 1 hr before supper, Until Discontinued, Historical Med    pregabalin (LYRICA) 200 MG capsule Take 200 mg by mouth 2 (two) times daily., Until Discontinued, Historical Med    propranolol ER (INDERAL LA) 80 MG 24 hr capsule Take 80 mg by mouth as needed., Until Discontinued, Historical Med    SUMAtriptan (IMITREX) 25 MG tablet Take 25 mg by mouth every 2 (two) hours as needed for migraine. May repeat in 2 hours if headache persists or recurs., Until Discontinued, Historical Med    tiZANidine (ZANAFLEX) 4 MG tablet Take 4 mg by mouth every 6 (six) hours as needed for muscle spasms., Until Discontinued, Historical Med    ARIPiprazole (ABILIFY) 10 MG tablet Take 10 mg by mouth daily as needed., Until Discontinued, Historical Med    DULoxetine (CYMBALTA) 60 MG capsule Take 60 mg by mouth daily. AM, Until Discontinued, Historical Med         DISCHARGE INSTRUCTIONS:    DIET:  Regular diet  DISCHARGE CONDITION:  Good  ACTIVITY:  Activity as tolerated  OXYGEN:  Home Oxygen: No.   Oxygen Delivery: room air  DISCHARGE LOCATION:  home   If you experience worsening of your admission symptoms, develop shortness of breath, life threatening emergency, suicidal or homicidal thoughts you must seek medical attention immediately by calling 911 or calling your MD immediately  if symptoms less severe.  You Must read complete instructions/literature along with all the possible adverse reactions/side effects for all the Medicines you take and that have been prescribed to you.  Take any new Medicines after you have completely understood and accpet all the possible adverse reactions/side effects.   Please note  You were cared for by a hospitalist during your hospital stay. If you have any questions about your discharge medications or the care you received while you were in the hospital after you are discharged, you can call the unit and asked to speak with the hospitalist on call if the hospitalist that took care of you is not available. Once you are discharged, your primary care physician will handle any further medical issues. Please note that NO REFILLS for any discharge medications will be authorized once you are discharged, as it is imperative that you return to your primary care physician (or establish a relationship with a primary care physician if you do not have one) for your aftercare needs so that they can reassess your need for medications and monitor your lab values.    On the day of Discharge:  VITAL SIGNS:  Blood pressure 124/67, pulse 75, temperature 97.4 F (36.3 C),  temperature source Oral, resp. rate 19, height 5\' 5"  (1.651 m), weight 90.067 kg (198 lb 9 oz), SpO2 96 %. PHYSICAL EXAMINATION:  GENERAL:  50 y.o.-year-old patient lying in the bed with no acute distress.  EYES: Pupils equal, round, reactive to light and accommodation. No scleral icterus. Extraocular muscles intact.  HEENT: Head atraumatic, normocephalic. Oropharynx and nasopharynx clear.  NECK:  Supple, no jugular venous distention. No thyroid enlargement, no tenderness.  LUNGS: Normal breath sounds bilaterally, no wheezing, rales,rhonchi or crepitation. No use of accessory muscles of respiration.  CARDIOVASCULAR: S1, S2 normal. No murmurs, rubs, or gallops.  ABDOMEN: Soft, non-tender, non-distended. Bowel sounds present. No organomegaly or mass.  EXTREMITIES: No pedal edema, cyanosis, or clubbing.  NEUROLOGIC: Cranial nerves II through XII are intact. Muscle strength 5/5 in all  extremities. Sensation intact. Gait not checked.  PSYCHIATRIC: The patient is alert and oriented x 3.  SKIN: No obvious rash, lesion, or ulcer.  DATA REVIEW:   CBC  Recent Labs Lab 03/20/16 0550  WBC 12.7*  HGB 12.9  HCT 37.7  PLT 231    Chemistries   Recent Labs Lab 03/18/16 1158  03/20/16 0550  NA 139  < > 139  K 3.0*  < > 4.2  CL 104  < > 100*  CO2 26  < > 31  GLUCOSE 114*  < > 151*  BUN 7  < > 18  CREATININE 0.91  < > 0.99  CALCIUM 9.1  < > 9.4  MG 1.8  --   --   < > = values in this interval not displayed.    Management plans discussed with the patient, family and they are in agreement.  CODE STATUS:  Code Status History    Date Active Date Inactive Code Status Order ID Comments User Context   03/18/2016  3:53 PM 03/20/2016  5:48 PM Full Code [id number]  Katha HammingSnehalatha Konidena, MD ED      TOTAL TIME TAKING CARE OF THIS PATIENT: 45 minutes.    [facility], Dontray Haberland M.D on 03/21/2016 at 5:41 PM  Between 7am to 6pm - Pager - [phone]  After 6pm go to [email or web address] - password [email or web address] [email or web address]  [facility]  Office  [phone]  CC: Primary care physician; Leanna SatoMILES,LINDA M, MD   Note: This dictation was prepared with Dragon dictation along with smaller phrase technology. Any transcriptional errors that result from this process are unintentional.

## 2016-04-01 ENCOUNTER — Ambulatory Visit: Payer: Medicare Other | Attending: Internal Medicine

## 2016-04-03 ENCOUNTER — Telehealth: Payer: Self-pay

## 2016-04-03 NOTE — Telephone Encounter (Signed)
Called patient to schedule appt from paper referral.  No ans. No VM.  Called [facility] to notify of attempt to contact patient for appt.

## 2016-04-09 NOTE — Progress Notes (Signed)
[facility] [place] [facility]monary Medicine Consultation      Assessment and Plan:  Asthma exacerbation. --Persistent symptoms, requiring nebulizer daily; Will start singulair.  --Decrease  Nebulizer to prn, not scheduled.  --Continue breo.  --Check CBC with differential and RAST testing.   Dyspnea.  --Discussed that she is still recovering from her recent pneumonia and will take a few more weeks for her to feel back to baseline.  --Meanwhile, I would like her to start exercising and trying to increase her overall activity level.   Pneumonia --Recent  Episode of pneumonia, now resolved.   ?Interstitial lung disease. --Questionable scarring vs. pulm edema/atelectasis.  --Will send for CT hi-res and PFT.   Bronchiectasis. --As above, will send for CT hi-res.   OSA -Recently diagnosed with sleep apnea, she is waiting to be started on CPAP. Will need to obtain results of sleep testing.  -Patient has significant lethargy on today's examination, this may be secondary to untreated sleep apnea versus medication effect from her Zanaflex and Xanax. We'll await to see if her sleepiness improves. Once being started on CPAP.  GERD. -Continue omeprazole daily.  Date: 04/09/2016  MRN# [id number] Amanda [name] [birth year]  Referring Physician: Pasty Spillers McLean-Scocozza, MD   Amanda [name] is a 50 y.o. old female seen in consultation for chief complaint of:    Chief Complaint  Patient presents with  . Advice Only    asthma: prod cough w/yellow mucus; chest tightness; admitted 03/18/16 for CAP    HPI:   The patient is a 50 year old female with a history of asthma, hypertension, diabetes, GERD, fibromyalgia. She was recently discharged from the hospital on 03/20/16 after a 2 day admission for community acquired pneumonia as well as asthma exacerbation. She notes that her breathing has been worse over the past few months, she is currently on Breo once daily, albuterol nebs 4 or 5  Times per day which is  how it is prescribed. She uses proair when she is out, about every 4 to 6 hours.  She has gerd, and she takes omeprazole every night.  She has no pets. She has been diagnosed with OSA, she had a sleep study which was positive and then had what sounds like a titration study, she is now waiting for a machine.  No recent CBC with differential. She is not on singulair. She takes zanaflex twice per day for fibromyalgia.   Beginning O2 sat on RA at rest. After walking 600 feet her sat was normal and HR 115. Moderate dyspnea but recovered quickly.   Chest x-ray images from 03/20/16 and CT chest images from 01/04/16 reviewed: Bibasilar subpleural fine interstitial changes, some mild bibasilar bronchiectasis changes. Cardiomegaly> Finding c/w CHF vs early pulmonary fibrosis.   Review of summary of outside records: Patient has a history of diabetes mellitus, hypertension, sleep apnea, asthma, multiple episodes of pneumonia, review of CMP from 03/26/16, normal. She has been having trouble with wheezing and has been treated for asthma exacerbation and pneumonia. She had a CT at [facility] and was told it was consistent with heart failure.  PMHX:   Past Medical History:  Diagnosis Date  . Arthritis    knees, shoulder, upper back  . Asthma   . Diabetes mellitus without complication (HCC)   . Fibromyalgia   . GERD (gastroesophageal reflux disease)   . Headache    migraines - 5x/mo  . Hypertension    no meds currently  . Motion sickness    ships  .  Thyroid nodule    bilateral and goiter  . Wears contact lenses   . Wears dentures    partial upper   Surgical Hx:  Past Surgical History:  Procedure Laterality Date  . ABDOMINAL HYSTERECTOMY    . ABDOMINAL SURGERY     laparoscopy x4 with lysis of adhesions  . APPENDECTOMY    . CESAREAN SECTION    . ECTOPIC PREGNANCY SURGERY    . HERNIA REPAIR    . JOINT REPLACEMENT Right 12/16/12   knee- medial - makoplasty  . KNEE ARTHROSCOPY Right    partial medial  and lateral meniscectomies  . KNEE ARTHROSCOPY Right 10/06/2015   Procedure: RIGHT KNEE ARTHROSCOPY WITH DEBRIDEMENT;  Surgeon: Amanda SonsHarold Kernodle, MD;  [facility];  Service: Orthopedics;  Laterality: Right;  Diabetic - insulin and oral meds  . ROTATOR CUFF REPAIR Left    Family Hx:  No family history on file. Social Hx:   Social History  Substance Use Topics  . Smoking status: Never Smoker  . Smokeless tobacco: Never Used  . Alcohol use No   Medication:   Reviewed.     Allergies:  Penicillins and Ace inhibitors  Review of Systems: Gen:  Denies  fever, sweats, chills HEENT: Denies blurred vision,  Cvc:  No dizziness, chest pain. Resp:   Denies cough or sputum production, Gi: Denies swallowing difficulty, stomach pain. Gu:  Denies bladder incontinence, burning urine Ext:   No Joint pain, stiffness. Skin: No skin rash,  hives  Endoc:  No polyuria, polydipsia. Psych: No depression, insomnia. Other:  All other systems were reviewed with the patient and were negative other that what is mentioned in the HPI.   Physical Examination:   VS: BP 132/88 (BP Location: Left Arm, Cuff Size: Normal)   Pulse 89   Ht 5\' 5"  (1.651 m)   Wt 205 lb (93 kg)   SpO2 95%   BMI 34.11 kg/m   General Appearance: No distress  Neuro:without focal findings,  speech normal,  HEENT: PERRLA, EOM intact.   Pulmonary: normal breath sounds, No wheezing.  CardiovascularNormal S1,S2.  No m/r/g.   Abdomen: Benign, Soft, non-tender. Renal:  No costovertebral tenderness  GU:  No performed at this time. Endoc: No evident thyromegaly, no signs of acromegaly. Skin:   warm, no rashes, no ecchymosis  Extremities: normal, no cyanosis, clubbing.  Other findings:    LABORATORY PANEL:   CBC No results for input(s): WBC, HGB, HCT, PLT in the last 168 hours. ------------------------------------------------------------------------------------------------------------------  Chemistries  No  results for input(s): NA, K, CL, CO2, GLUCOSE, BUN, CREATININE, CALCIUM, MG, AST, ALT, ALKPHOS, BILITOT in the last 168 hours.  Invalid input(s): GFRCGP ------------------------------------------------------------------------------------------------------------------  Cardiac Enzymes No results for input(s): TROPONINI in the last 168 hours. ------------------------------------------------------------  RADIOLOGY:  No results found.     Thank  you for the consultation and for allowing [facility] to assist in the care of your patient. Our recommendations are noted above.  Please contact us if we can be of further service.   Wells Guileseep Nathyn Luiz, MD.  Board Certified in Internal Medicine, Pulmonary Medicine, Critical Care Medicine, and Sleep Medicine.  [facility] Office Number: [phone]  Santiago Gladavid Kasa, M.D.  Stephanie AcreVishal Mungal, M.D.  Billy Fischeravid Simonds, M.D  04/09/2016

## 2016-04-11 ENCOUNTER — Ambulatory Visit (INDEPENDENT_AMBULATORY_CARE_PROVIDER_SITE_OTHER): Payer: Medicare Other | Admitting: Internal Medicine

## 2016-04-11 ENCOUNTER — Encounter: Payer: Self-pay | Admitting: Internal Medicine

## 2016-04-11 ENCOUNTER — Other Ambulatory Visit
Admission: RE | Admit: 2016-04-11 | Discharge: 2016-04-11 | Disposition: A | Discharge status: Home / Self Care | Payer: Medicare Other | Source: Ambulatory Visit | Attending: Internal Medicine | Admitting: Internal Medicine

## 2016-04-11 VITALS — BP 132/88 | HR 89 | Ht 65.0 in | Wt 205.0 lb

## 2016-04-11 DIAGNOSIS — J4551 Severe persistent asthma with (acute) exacerbation: Secondary | ICD-10-CM | POA: Diagnosis not present

## 2016-04-11 LAB — CBC WITH DIFFERENTIAL/PLATELET
Basophils Absolute: 0 10*3/uL (ref 0–0.1)
Basophils Relative: 1 %
Eosinophils Absolute: 0.1 10*3/uL (ref 0–0.7)
Eosinophils Relative: 1 %
HCT: 37.8 % (ref 35.0–47.0)
Hemoglobin: 13.3 g/dL (ref 12.0–16.0)
Lymphocytes Relative: 28 %
Lymphs Abs: 1.5 10*3/uL (ref 1.0–3.6)
MCH: 31.4 pg (ref 26.0–34.0)
MCHC: 35.1 g/dL (ref 32.0–36.0)
MCV: 89.5 fL (ref 80.0–100.0)
Monocytes Absolute: 0.5 10*3/uL (ref 0.2–0.9)
Monocytes Relative: 9 %
Neutro Abs: 3.2 10*3/uL (ref 1.4–6.5)
Neutrophils Relative %: 61 %
Platelets: 251 10*3/uL (ref 150–440)
RBC: 4.22 MIL/uL (ref 3.80–5.20)
RDW: 15.6 % — ABNORMAL HIGH (ref 11.5–14.5)
WBC: 5.3 10*3/uL (ref 3.6–11.0)

## 2016-04-11 MED ORDER — MONTELUKAST SODIUM 10 MG PO TABS: 10.0000 mg | ORAL_TABLET | Freq: Every day | ORAL | 2 refills | Status: DC

## 2016-04-11 NOTE — Patient Instructions (Addendum)
-  Sent for CT chest, high-resolution to rule out interstitial lung disease.  -Continue Breo, start Singulair 10 mg every night. Use nebulizers only when needed for difficulty breathing.  -Increase her activity level, walk for 30 minutes every day.  -Pulmonary function testing before next visit.  -RAST allergy testing, and CBC with differential before next appointment.  -Start on CPAP as prescribed, we will need to obtain results of recent sleep study.

## 2016-04-12 LAB — MISC LABCORP TEST (SEND OUT): Labcorp test code: 602628

## 2016-05-21 ENCOUNTER — Ambulatory Visit
Admission: RE | Admit: 2016-05-21 | Discharge: 2016-05-21 | Disposition: A | Discharge status: Home / Self Care | Payer: Medicare Other | Source: Ambulatory Visit | Attending: Internal Medicine | Admitting: Internal Medicine

## 2016-05-21 DIAGNOSIS — K760 Fatty (change of) liver, not elsewhere classified: Secondary | ICD-10-CM | POA: Diagnosis not present

## 2016-05-21 DIAGNOSIS — I313 Pericardial effusion (noninflammatory): Secondary | ICD-10-CM | POA: Diagnosis not present

## 2016-05-21 DIAGNOSIS — I517 Cardiomegaly: Secondary | ICD-10-CM | POA: Insufficient documentation

## 2016-05-21 DIAGNOSIS — J4551 Severe persistent asthma with (acute) exacerbation: Secondary | ICD-10-CM | POA: Diagnosis not present

## 2016-05-21 DIAGNOSIS — E042 Nontoxic multinodular goiter: Secondary | ICD-10-CM | POA: Diagnosis not present

## 2016-05-22 ENCOUNTER — Telehealth: Payer: Self-pay | Admitting: *Deleted

## 2016-05-22 NOTE — Telephone Encounter (Signed)
-----   Message from Shane CrutchPradeep Ramachandran, MD sent at 05/22/2016 12:14 PM EDT ----- Regarding: clearance We are asked to provide clearance for colonoscopy and egd. Please inform patient that she can go for these procedures but should postpone them if her breathing is difficult, if it worsens or she develops a chest cold before the procedure.

## 2016-05-22 NOTE — Telephone Encounter (Signed)
LMOM for pt to return call. Clear[facility].

## 2016-05-23 ENCOUNTER — Ambulatory Visit (INDEPENDENT_AMBULATORY_CARE_PROVIDER_SITE_OTHER): Payer: Medicare Other | Admitting: *Deleted

## 2016-05-23 DIAGNOSIS — J4551 Severe persistent asthma with (acute) exacerbation: Secondary | ICD-10-CM

## 2016-05-23 LAB — PULMONARY FUNCTION TEST
DL/VA % pred: 142 %
DL/VA: 7 ml/min/mmHg/L
DLCO unc % pred: 85 %
DLCO unc: 22.02 ml/min/mmHg
FEF 25-75 Post: 4.52 L/sec
FEF 25-75 Pre: 2.58 L/sec
FEF2575-%Change-Post: 75 %
FEF2575-%Pred-Post: 182 %
FEF2575-%Pred-Pre: 104 %
FEV1-%Change-Post: 31 %
FEV1-%Pred-Post: 86 %
FEV1-%Pred-Pre: 65 %
FEV1-Post: 2.07 L
FEV1-Pre: 1.57 L
FEV1FVC-%Change-Post: 1 %
FEV1FVC-%Pred-Pre: 111 %
FEV6-%Change-Post: 29 %
FEV6-%Pred-Post: 77 %
FEV6-%Pred-Pre: 59 %
FEV6-Post: 2.24 L
FEV6-Pre: 1.73 L
FEV6FVC-%Pred-Post: 103 %
FEV6FVC-%Pred-Pre: 103 %
FVC-%Change-Post: 29 %
FVC-%Pred-Post: 75 %
FVC-%Pred-Pre: 58 %
FVC-Post: 2.24 L
FVC-Pre: 1.73 L
Post FEV1/FVC ratio: 92 %
Post FEV6/FVC ratio: 100 %
Pre FEV1/FVC ratio: 91 %
Pre FEV6/FVC Ratio: 100 %

## 2016-05-23 NOTE — Telephone Encounter (Signed)
Pt informed when she came in for PFT. Nothing further needed.

## 2016-05-23 NOTE — Progress Notes (Signed)
PFT performed today with Nitrogen washout. 

## 2016-06-01 NOTE — Progress Notes (Signed)
[facility] Consultation      Assessment and Plan:  Asthma exacerbation. --Continued asthma, continue with singulair, nebs, Breo.  --Normal CBC will await results of RAST.   Dyspnea.  --Improved, she has some atelectasis on CT scan, and restriction on PFT suggestive of obesity, recommended that she lose weight and start exercising.    Pneumonia --Recent  Episode of pneumonia, now resolved.   Bronchiectasis. --As above, will send for CT hi-res.   OSA -Recently diagnosed with sleep apnea, she is doing well with cpap, she has just started on cpap, to continue.   GERD. -Continue omeprazole daily.  Date: 06/01/2016  MRN# [id number] Amanda [name] 07-06-1966  Referring Physician: Pasty Spillers McLean-Scocozza, MD   [name] is a 50 y.o. old female seen in consultation for chief complaint of:    Chief Complaint  Patient presents with  . Follow-up    PFT/CT results. pt states breathing is doing well. pt c/o sob with exertion & occ wheezing.    HPI:   The patient is a 51 year old female with a history of asthma, hypertension, diabetes, GERD, fibromyalgia. She was admitted to the hospital in July of 2017 with pneumonia and AE Asthma.  At last visit she was having persistent symptoms on Breo once daily, albuterol nebs; we added singulair and sent for RAST and CBC. She notes that her breathing is much better, she is trying to "move around" more but not exercising. She used to exercise, but has not gone back to that yet.   She has gerd, and she takes omeprazole every night.   She has no pets. She has been diagnosed with OSA, she is now on cpap, she is more awake during the day.   No recent CBC with differential. She is not on singulair. She takes zanaflex twice per day for fibromyalgia.   Beginning O2 sat on RA at rest. After walking 600 feet her sat was normal and HR 115. Moderate dyspnea but recovered quickly.   CT chest images from 05/21/16 reviewed; mild  basilar atelectasis and minimal subpleural interstitial changes.  Chest x-ray 03/20/16 and CT chest images from 01/04/16: Bibasilar subpleural fine interstitial changes, some mild bibasilar bronchiectasis changes. Cardiomegaly> Finding c/w CHF vs early pulmonary fibrosis.   Summary of outside records: Patient has a history of diabetes mellitus, hypertension, sleep apnea, asthma, multiple episodes of pneumonia, review of CMP from 03/26/16, normal. She has been having trouble with wheezing and has been treated for asthma exacerbation and pneumonia. She had a CT at [facility] and was told it was consistent with heart failure.  CBC 04/11/16: Eos 0.1 RAST: Pending.   PFT 05/23/16: Spirometry shows restriction without reversal with bronchodilatory therapy.   Medication:   Reviewed.     Allergies:  Penicillins and Ace inhibitors  Review of Systems: Gen:  Denies  fever, sweats, chills HEENT: Denies blurred vision,  Cvc:  No dizziness, chest pain. Resp:   Denies cough or sputum production, Gi: Denies swallowing difficulty, stomach pain. Other:  All other systems were reviewed with the patient and were negative other that what is mentioned in the HPI.   Physical Examination:   VS: BP 124/68 (BP Location: Left Arm, Cuff Size: Normal)   Pulse 67   Ht 5\' 5"  (1.651 m)   Wt 199 lb (90.3 kg)   SpO2 99%   BMI 33.12 kg/m   General Appearance: No distress  Neuro:without focal findings,  speech normal,  HEENT: PERRLA, EOM intact.  Pulmonary: normal breath sounds, No wheezing.  CardiovascularNormal S1,S2.  No m/r/g.   Abdomen: Benign, Soft, non-tender. Renal:  No costovertebral tenderness  GU:  No performed at this time. Endoc: No evident thyromegaly, no signs of acromegaly. Skin:   warm, no rashes, no ecchymosis  Extremities: normal, no cyanosis, clubbing.  Other findings:    LABORATORY PANEL:   CBC No results for input(s): WBC, HGB, HCT, PLT in the last 168  hours. ------------------------------------------------------------------------------------------------------------------  Chemistries  No results for input(s): NA, K, CL, CO2, GLUCOSE, BUN, CREATININE, CALCIUM, MG, AST, ALT, ALKPHOS, BILITOT in the last 168 hours.  Invalid input(s): GFRCGP ------------------------------------------------------------------------------------------------------------------  Cardiac Enzymes No results for input(s): TROPONINI in the last 168 hours. ------------------------------------------------------------  RADIOLOGY:  No results found.     Thank  you for the consultation and for allowing [facility] to assist in the care of your patient. Our recommendations are noted above.  Please contact us if we can be of further service.   Wells Guileseep Esley Brooking, MD.  Board Certified in Internal Medicine, Pulmonary Medicine, Critical Care Medicine, and Sleep Medicine.  [place] Office Number: [phone]  Santiago Gladavid Kasa, M.D.  Stephanie AcreVishal Mungal, M.D.  Billy Fischeravid Simonds, M.D  06/01/2016

## 2016-06-05 ENCOUNTER — Encounter: Payer: Self-pay | Admitting: Internal Medicine

## 2016-06-05 ENCOUNTER — Ambulatory Visit (INDEPENDENT_AMBULATORY_CARE_PROVIDER_SITE_OTHER): Payer: Medicare Other | Admitting: Internal Medicine

## 2016-06-05 VITALS — BP 124/68 | HR 67 | Ht 65.0 in | Wt 199.0 lb

## 2016-06-05 DIAGNOSIS — K219 Gastro-esophageal reflux disease without esophagitis: Secondary | ICD-10-CM | POA: Diagnosis not present

## 2016-06-05 DIAGNOSIS — G4733 Obstructive sleep apnea (adult) (pediatric): Secondary | ICD-10-CM

## 2016-06-05 DIAGNOSIS — J4550 Severe persistent asthma, uncomplicated: Secondary | ICD-10-CM

## 2016-06-05 DIAGNOSIS — Z23 Encounter for immunization: Secondary | ICD-10-CM

## 2016-06-05 MED ORDER — ALBUTEROL SULFATE HFA 108 (90 BASE) MCG/ACT IN AERS: 2.0000 | INHALATION_SPRAY | RESPIRATORY_TRACT | 3 refills | Status: DC | PRN

## 2016-06-05 MED ORDER — MONTELUKAST SODIUM 10 MG PO TABS: 10.0000 mg | ORAL_TABLET | Freq: Every day | ORAL | 2 refills | Status: DC

## 2016-06-05 MED ORDER — FLUTICASONE FUROATE-VILANTEROL 200-25 MCG/INH IN AEPB: 1.0000 | INHALATION_SPRAY | Freq: Every day | RESPIRATORY_TRACT | 5 refills | Status: DC

## 2016-06-05 NOTE — Addendum Note (Signed)
Addended by: Maxwell MarionBLANKENSHIP, MARGIE A on: 06/05/2016 09:24 AM   Modules accepted: Orders

## 2016-06-05 NOTE — Patient Instructions (Addendum)
-  Start gradually increasing your activity level.  --Use proair 2 puffs before exercise.  --Rinse mouth after using Breo.    Sleep Apnea Sleep apnea is disorder that affects a person's sleep. A person with sleep apnea has abnormal pauses in their breathing when they sleep. It is hard for them to get a good sleep. This makes a person tired during the day. It also can lead to other physical problems. There are three types of sleep apnea. One type is when breathing stops for a short time because your airway is blocked (obstructive sleep apnea). Another type is when the brain sometimes fails to give the normal signal to breathe to the muscles that control your breathing (central sleep apnea). The third type is a combination of the other two types. HOME CARE   Take all medicine as told by your doctor.  Avoid alcohol, calming medicines (sedatives), and depressant drugs.  Try to lose weight if you are overweight. Talk to your doctor about a healthy weight goal.  Your doctor may have you use a device that helps to open your airway. It can help you get the air that you need. It is called a positive airway pressure (PAP) device.   MAKE SURE YOU:   Understand these instructions.  Will watch your condition.  Will get help right away if you are not doing well or get worse.  It may take approximately 1 month for you to get used to wearing her CPAP every night.

## 2016-06-17 ENCOUNTER — Telehealth: Payer: Self-pay | Admitting: Internal Medicine

## 2016-06-17 NOTE — Telephone Encounter (Signed)
Pt would like for someone to call her back regarding her clearance for her upper GI procedure. Please call.

## 2016-06-17 NOTE — Telephone Encounter (Signed)
Pt asked why she wasn't cleared for her procedure. Informed pt due to her recent PNA and asthma exacerbation. Informed pt she is to f/u in 3-4 months and he will then re-evaluate. Pt verbalized understanding and nothing further needed.

## 2016-08-15 ENCOUNTER — Encounter: Payer: Self-pay | Admitting: Emergency Medicine

## 2016-08-15 ENCOUNTER — Emergency Department: Payer: Medicare Other

## 2016-08-15 ENCOUNTER — Emergency Department
Admission: EM | Admit: 2016-08-15 | Discharge: 2016-08-15 | Disposition: A | Discharge status: Home / Self Care | Payer: Medicare Other | Attending: Emergency Medicine | Admitting: Emergency Medicine

## 2016-08-15 DIAGNOSIS — Z794 Long term (current) use of insulin: Secondary | ICD-10-CM | POA: Diagnosis not present

## 2016-08-15 DIAGNOSIS — R05 Cough: Secondary | ICD-10-CM

## 2016-08-15 DIAGNOSIS — Z79899 Other long term (current) drug therapy: Secondary | ICD-10-CM | POA: Insufficient documentation

## 2016-08-15 DIAGNOSIS — E1165 Type 2 diabetes mellitus with hyperglycemia: Secondary | ICD-10-CM | POA: Insufficient documentation

## 2016-08-15 DIAGNOSIS — I10 Essential (primary) hypertension: Secondary | ICD-10-CM | POA: Insufficient documentation

## 2016-08-15 DIAGNOSIS — R079 Chest pain, unspecified: Secondary | ICD-10-CM

## 2016-08-15 DIAGNOSIS — R059 Cough, unspecified: Secondary | ICD-10-CM

## 2016-08-15 DIAGNOSIS — J45909 Unspecified asthma, uncomplicated: Secondary | ICD-10-CM | POA: Diagnosis not present

## 2016-08-15 DIAGNOSIS — R0981 Nasal congestion: Secondary | ICD-10-CM | POA: Diagnosis present

## 2016-08-15 DIAGNOSIS — R739 Hyperglycemia, unspecified: Secondary | ICD-10-CM

## 2016-08-15 DIAGNOSIS — R0602 Shortness of breath: Secondary | ICD-10-CM

## 2016-08-15 LAB — BASIC METABOLIC PANEL
Anion gap: 6 (ref 5–15)
BUN: 13 mg/dL (ref 6–20)
CO2: 27 mmol/L (ref 22–32)
Calcium: 9.5 mg/dL (ref 8.9–10.3)
Chloride: 106 mmol/L (ref 101–111)
Creatinine, Ser: 0.91 mg/dL (ref 0.44–1.00)
GFR calc Af Amer: >60 mL/min (ref >60)
GFR calc non Af Amer: >60 mL/min (ref >60)
Glucose, Bld: 265 mg/dL — ABNORMAL HIGH (ref 65–99)
Potassium: 4.2 mmol/L (ref 3.5–5.1)
Sodium: 139 mmol/L (ref 135–145)

## 2016-08-15 LAB — CBC
HCT: 39.7 % (ref 35.0–47.0)
Hemoglobin: 13.4 g/dL (ref 12.0–16.0)
MCH: 29.9 pg (ref 26.0–34.0)
MCHC: 33.7 g/dL (ref 32.0–36.0)
MCV: 88.6 fL (ref 80.0–100.0)
Platelets: 245 10*3/uL (ref 150–440)
RBC: 4.48 MIL/uL (ref 3.80–5.20)
RDW: 16.6 % — ABNORMAL HIGH (ref 11.5–14.5)
WBC: 5 10*3/uL (ref 3.6–11.0)

## 2016-08-15 LAB — TROPONIN I: Troponin I: <0.03 ng/mL (ref <0.03)

## 2016-08-15 MED ORDER — KETOROLAC TROMETHAMINE 10 MG PO TABS: 10.0000 mg | ORAL_TABLET | Freq: Three times a day (TID) | ORAL | 0 refills | Status: DC | PRN

## 2016-08-15 MED ORDER — KETOROLAC TROMETHAMINE 30 MG/ML IJ SOLN
30.0000 mg | Freq: Once | INTRAMUSCULAR | Status: AC
  Administered 2016-08-15: 30 mg via INTRAVENOUS
  Filled 2016-08-15: qty 1

## 2016-08-15 MED ORDER — SODIUM CHLORIDE 0.9 % IV BOLUS (SEPSIS)
1000.0000 mL | Freq: Once | INTRAVENOUS | Status: AC
  Administered 2016-08-15: 1000 mL via INTRAVENOUS

## 2016-08-15 NOTE — ED Triage Notes (Signed)
Pt presents to ED with c/o chest pain and shortness of breath. Pt states recurrent chest pain since this weekend, describes as substernal tightness with radiation to back. Pt is alert and oriented at this time. Pt states hx of HTN that she controls with medication that she has not been taking and has been controlling HTN by her eating. Pt states she takes diltiazem for sinus tachycardia.

## 2016-08-15 NOTE — Discharge Instructions (Signed)
Please drink plenty of fluid to stay well-hydrated and to treat your elevated blood sugar. Please restart all of your regular home medications.  Please return to the emergency department if you develop severe pain, fever, inability to keep down fluids, shortness of breath, or any other symptoms concerning to you.

## 2016-08-15 NOTE — ED Provider Notes (Signed)
[facility] Emergency Department Provider Note  ____________________________________________  Time seen: Approximately 4:23 PM  I have reviewed the triage vital signs and the nursing notes.   HISTORY  Chief Complaint Chest Pain and Shortness of Breath    HPI Amanda [name] is a 50 y.o. female with a history of fibromyalgia, HTN, asthma presenting with shortness of breath, nonproductive cough, diffuse myalgias, and chest pain. The patient reports that 5 days ago, she was cleaning up her house when she had some shortness of breath. This has persisted and she has developed a nonproductive cough with congestion but no rhinorrhea, no sore throat or ear pain. She is also been experiencing full body pain "from head to toe" which is typical of her acute exacerbations of fibromyalgia. Occasionally, she will also have a midsternal linear chest pain without radiation, diaphoresis, nausea or vomiting, palpitations, lightheadedness or syncope. The patient states that she used to be on a diuretic, but is no longer taking that medication and is unclear whether she stopped herself or with her primary care physician took her off the medicine.   Past Medical History:  Diagnosis Date  . Arthritis    knees, shoulder, upper back  . Asthma   . Diabetes mellitus without complication (HCC)   . Fibromyalgia   . GERD (gastroesophageal reflux disease)   . Headache    migraines - 5x/mo  . Hypertension    no meds currently  . Motion sickness    ships  . Thyroid nodule    bilateral and goiter  . Wears contact lenses   . Wears dentures    partial upper    Patient Active Problem List   Diagnosis Date Noted  . Acute respiratory failure with hypoxemia (HCC) 03/18/2016    Past Surgical History:  Procedure Laterality Date  . ABDOMINAL HYSTERECTOMY    . ABDOMINAL SURGERY     laparoscopy x4 with lysis of adhesions  . APPENDECTOMY    . CESAREAN SECTION    . ECTOPIC PREGNANCY  SURGERY    . HERNIA REPAIR    . JOINT REPLACEMENT Right 12/16/12   knee- medial - makoplasty  . KNEE ARTHROSCOPY Right    partial medial and lateral meniscectomies  . KNEE ARTHROSCOPY Right 10/06/2015   Procedure: RIGHT KNEE ARTHROSCOPY WITH DEBRIDEMENT;  Surgeon: Erin Sons, MD;  Location: [facility];  Service: Orthopedics;  Laterality: Right;  Diabetic - insulin and oral meds  . ROTATOR CUFF REPAIR Left     Current Outpatient Rx  . Order #: [id number] Class: Normal  . Order #: [id number] Class: Historical Med  . Order #: [id number] Class: Historical Med  . Order #: [id number] Class: Historical Med  . Order #: [id number] Class: Historical Med  . Order #: [id number] Class: Historical Med  . Order #: [id number] Class: Historical Med  . Order #: [id number] Class: Historical Med  . Order #: [id number] Class: Historical Med  . Order #: [id number] Class: Historical Med  . Order #: [id number] Class: Normal  . Order #: [id number] Class: Historical Med  . Order #: [id number] Class: Historical Med  . Order #: [id number] Class: Historical Med  . Order #: [id number] Class: Historical Med  . Order #: [id number] Class: Normal  . Order #: [id number] Class: Historical Med  . Order #: [id number] Class: Historical Med  . Order #: [id number] Class: Historical Med  . Order #: [id number] Class: Historical Med    Allergies Penicillins and Ace inhibitors  History reviewed. No pertinent family history.  Social History Social History  Substance Use Topics  . Smoking status: Never Smoker  .  Smokeless tobacco: Never Used  . Alcohol use No    Review of Systems Constitutional: No fever/chills.No lightheadedness or syncope. Eyes: No visual changes. ENT: No sore throat. Positive congestion without rhinorrhea. Cardiovascular: Positive chest pain. Denies palpitations. Respiratory: Positive shortness of breath.  Positive nonproductive cough. Gastrointestinal: No abdominal pain.  No nausea, no vomiting.  No diarrhea.  No  constipation. Genitourinary: Negative for dysuria. Musculoskeletal: Negative for back pain. Skin: Negative for rash. Neurological: Negative for headaches. No focal numbness, tingling or weakness.   10-point ROS otherwise negative.  ____________________________________________   PHYSICAL EXAM:  VITAL SIGNS: ED Triage Vitals  Enc Vitals Group     BP 08/15/16 1312 (!) 145/77     Pulse Rate 08/15/16 1312 (!) 111     Resp 08/15/16 1312 18     Temp 08/15/16 1312 98.7 F (37.1 C)     Temp Source 08/15/16 1312 Oral     SpO2 08/15/16 1312 100 %     Weight 08/15/16 1307 203 lb (92.1 kg)     Height 08/15/16 1307 5\' 5"  (1.651 m)     Head Circumference --      Peak Flow --      Pain Score 08/15/16 1308 9     Pain Loc --      Pain Edu? --      Excl. in GC? --     Constitutional: Alert and oriented. Slightly ill-appearing but in no acute distress. Answers questions appropriately. Eyes: Conjunctivae are normal.  EOMI. No scleral icterus. Head: Atraumatic. Nose: No congestion/rhinnorhea. Mouth/Throat: Mucous membranes are moist.  Neck: No stridor.  Supple.  No JVD. Cardiovascular: Normal rate, regular rhythm. No murmurs, rubs or gallops.  Respiratory: Normal respiratory effort.  No accessory muscle use or retractions. Lungs CTAB.  No wheezes, rales or ronchi. Gastrointestinal: Overweight. Soft, nontender and nondistended.  No guarding or rebound.  No peritoneal signs. Musculoskeletal: No LE edema. No ttp in the calves or palpable cords.  Negative Homan's sign. Neurologic:  A&Ox3.  Speech is clear.  Face and smile are symmetric.  EOMI.  Moves all extremities well. Skin:  Skin is warm, dry and intact. No rash noted. Psychiatric: Mood and affect are normal. Speech and behavior are normal.  Normal judgement.  ____________________________________________   LABS (all labs ordered are listed, but only abnormal results are displayed)  Labs Reviewed  BASIC METABOLIC PANEL - Abnormal;  Notable for the following:       Result Value   Glucose, Bld 265 (*)    All other components within normal limits  CBC - Abnormal; Notable for the following:    RDW 16.6 (*)    All other components within normal limits  TROPONIN I   ____________________________________________  EKG  ED ECG REPORT I, Rockne MenghiniNorman, Anne-Caroline, the attending physician, personally viewed and interpreted this ECG.   Date: 08/15/2016  EKG Time: 1307  Rate: 113  Rhythm: sinus tachycardia  Axis: Normal  Intervals:none  ST&T Change: Nonspecific T-wave inversion in V1. No ST elevation.  ____________________________________________  RADIOLOGY  Dg Chest 2 View  Result Date: 08/15/2016 CLINICAL DATA:  Patient with chest pain and shortness of breath. Substernal tightness. EXAM: CHEST  2 VIEW COMPARISON:  Chest radiograph 03/20/2016. FINDINGS: Stable cardiac and mediastinal contours. No consolidative pulmonary opacities. No pleural effusion or pneumothorax. Regional skeleton is unremarkable. IMPRESSION: No active cardiopulmonary disease. Electronically Signed   By: Annia Beltrew  Davis M.D.   On: 08/15/2016 13:38    ____________________________________________   PROCEDURES  Procedure(s) performed: None  Procedures  Critical Care performed: No ____________________________________________   INITIAL IMPRESSION / ASSESSMENT AND PLAN / ED COURSE  Pertinent labs & imaging results that were available during my care of the patient were reviewed by me and considered in my medical decision making (see chart for details).  50 y.o. female with asthma and fibromyalgia presenting with a nonproductive cough, congestion, shortness of breath, and chest pain. Overall, the patient has sinus tachycardia with mild hypertension but no fever. She does not have any acute abnormalities and her cardiopulmonary examination and her rate is normalized. I do not see any evidence of DVT, she does not have hypoxia, and her pain is  nonpleuritic so PE is much less likely. I would consider an upper respiratory infection that may be exacerbating her asthma, but she has no wheezing on my examination.  Given her mild symptoms, normal oxygenation, and hyperglycemia, I'm reticent to start her on a steroid which may exacerbate her hyperglycemia. I'll plan to treat the patient with Toradol for her body aches, and discharge her home with close PMD follow-up. She understands return precautions as well as follow-up instructions. I have instructed her to make a follow-up appointment in order to get restarted on her home medications per ____________________________________________  FINAL CLINICAL IMPRESSION(S) / ED DIAGNOSES  Final diagnoses:  Cough  Shortness of breath  Chest pain, unspecified type  Hyperglycemia  Essential hypertension    Clinical Course       NEW MEDICATIONS STARTED DURING THIS VISIT:  New Prescriptions   No medications on file      Rockne MenghiniAnne-Caroline Mirza Fessel, MD 08/15/16 1630

## 2016-09-27 ENCOUNTER — Emergency Department
Admission: EM | Admit: 2016-09-27 | Discharge: 2016-09-27 | Disposition: A | Discharge status: Home / Self Care | Payer: Medicare Other | Attending: Student in an Organized Health Care Education/Training Program | Admitting: Student in an Organized Health Care Education/Training Program

## 2016-09-27 ENCOUNTER — Emergency Department: Payer: Medicare Other

## 2016-09-27 DIAGNOSIS — J189 Pneumonia, unspecified organism: Secondary | ICD-10-CM

## 2016-09-27 DIAGNOSIS — I10 Essential (primary) hypertension: Secondary | ICD-10-CM | POA: Diagnosis not present

## 2016-09-27 DIAGNOSIS — Z794 Long term (current) use of insulin: Secondary | ICD-10-CM | POA: Diagnosis not present

## 2016-09-27 DIAGNOSIS — R05 Cough: Secondary | ICD-10-CM | POA: Diagnosis present

## 2016-09-27 DIAGNOSIS — Z79899 Other long term (current) drug therapy: Secondary | ICD-10-CM | POA: Insufficient documentation

## 2016-09-27 DIAGNOSIS — J181 Lobar pneumonia, unspecified organism: Secondary | ICD-10-CM | POA: Insufficient documentation

## 2016-09-27 DIAGNOSIS — E119 Type 2 diabetes mellitus without complications: Secondary | ICD-10-CM | POA: Diagnosis not present

## 2016-09-27 DIAGNOSIS — J45909 Unspecified asthma, uncomplicated: Secondary | ICD-10-CM | POA: Diagnosis not present

## 2016-09-27 MED ORDER — IBUPROFEN 600 MG PO TABS: 600.0000 mg | ORAL_TABLET | Freq: Three times a day (TID) | ORAL | 0 refills | Status: DC | PRN

## 2016-09-27 MED ORDER — PSEUDOEPH-BROMPHEN-DM 30-2-10 MG/5ML PO SYRP: 5.0000 mL | ORAL_SOLUTION | Freq: Four times a day (QID) | ORAL | 0 refills | Status: DC | PRN

## 2016-09-27 MED ORDER — AZITHROMYCIN 500 MG PO TABS: 500.0000 mg | ORAL_TABLET | Freq: Every day | ORAL | 0 refills | Status: DC

## 2016-09-27 NOTE — ED Provider Notes (Signed)
[facility] Emergency Department Provider Note   ____________________________________________   First MD Initiated Contact with Patient 09/27/16 1140     (approximate)  I have reviewed the triage vital signs and the nursing notes.   HISTORY  Chief Complaint Headache and Sore Throat    HPI Amanda [name] is a 51 y.o. female patient complaining of 4 days of a yellow productive cough, nasal congestion, and chest pain secondary to cough. Patient also state fever. Patient stated her temperature at home was 102 prior to arrival. Patient took Tylenol this morning. Patient denies any vomiting diarrhea. Patient is taking flu shot this season. No other palliative measures for her complaint. She rates her pain as a 10 over 10. Patient described a pain as "achy".   Past Medical History:  Diagnosis Date  . Arthritis    knees, shoulder, upper back  . Asthma   . Diabetes mellitus without complication (HCC)   . Fibromyalgia   . GERD (gastroesophageal reflux disease)   . Headache    migraines - 5x/mo  . Hypertension    no meds currently  . Motion sickness    ships  . Thyroid nodule    bilateral and goiter  . Wears contact lenses   . Wears dentures    partial upper    Patient Active Problem List   Diagnosis Date Noted  . Acute respiratory failure with hypoxemia (HCC) 03/18/2016    Past Surgical History:  Procedure Laterality Date  . ABDOMINAL HYSTERECTOMY    . ABDOMINAL SURGERY     laparoscopy x4 with lysis of adhesions  . APPENDECTOMY    . CESAREAN SECTION    . ECTOPIC PREGNANCY SURGERY    . HERNIA REPAIR    . JOINT REPLACEMENT Right 12/16/12   knee- medial - makoplasty  . KNEE ARTHROSCOPY Right    partial medial and lateral meniscectomies  . KNEE ARTHROSCOPY Right 10/06/2015   Procedure: RIGHT KNEE ARTHROSCOPY WITH DEBRIDEMENT;  Surgeon: Erin Sons, MD;  Location: [facility];  Service: Orthopedics;  Laterality: Right;  Diabetic -  insulin and oral meds  . ROTATOR CUFF REPAIR Left     Prior to Admission medications   Medication Sig Start Date End Date Taking? Authorizing Provider  albuterol (PROVENTIL HFA) 108 (90 Base) MCG/ACT inhaler Inhale 2 puffs into the lungs every 4 (four) hours as needed for wheezing or shortness of breath. 06/05/16   Shane Crutch, MD  ALPRAZolam Prudy Feeler) 1 MG tablet Take 1 mg by mouth at bedtime as needed for anxiety or sleep.    Historical Provider, MD  ARIPiprazole (ABILIFY) 10 MG tablet Take 10 mg by mouth daily as needed.    Historical Provider, MD  azithromycin (ZITHROMAX) 500 MG tablet Take 1 tablet (500 mg total) by mouth daily. Take 1 tablet daily for 3 days. 09/27/16   Joni Reining, PA-C  BIOTIN PO Take by mouth daily.    Historical Provider, MD  brompheniramine-pseudoephedrine-DM 30-2-10 MG/5ML syrup Take 5 mLs by mouth 4 (four) times daily as needed. 09/27/16   Joni Reining, PA-C  canagliflozin (INVOKANA) 300 MG TABS tablet Take 300 mg by mouth daily before breakfast.    Historical Provider, MD  cycloSPORINE (RESTASIS) 0.05 % ophthalmic emulsion 1 drop 2 (two) times daily.    Historical Provider, MD  diltiazem (CARDIZEM CD) 180 MG 24 hr capsule Take 180 mg by mouth daily.    Historical Provider, MD  diltiazem (DILACOR XR) 240 MG 24 hr  capsule  04/16/16   Historical Provider, MD  DULoxetine (CYMBALTA) 60 MG capsule Take 60 mg by mouth daily. AM    Historical Provider, MD  flunisolide (NASALIDE) 25 MCG/ACT (0.025%) SOLN Place 2 sprays into the nose daily as needed.    Historical Provider, MD  fluticasone furoate-vilanterol (BREO ELLIPTA) 200-25 MCG/INH AEPB Inhale 1 puff into the lungs daily. 06/05/16   Shane Crutch, MD  ibuprofen (ADVIL,MOTRIN) 600 MG tablet Take 1 tablet (600 mg total) by mouth every 8 (eight) hours as needed. 09/27/16   Joni Reining, PA-C  insulin detemir (LEVEMIR) 100 UNIT/ML injection Inject 10 Units into the skin at bedtime.    Historical Provider, MD    ipratropium-albuterol (DUONEB) 0.5-2.5 (3) MG/3ML SOLN Take 3 mLs by nebulization every 4 (four) hours as needed.    Historical Provider, MD  isosorbide mononitrate (IMDUR) 30 MG 24 hr tablet Take 30 mg by mouth daily.    Historical Provider, MD  ketorolac (TORADOL) 10 MG tablet Take 1 tablet (10 mg total) by mouth every 8 (eight) hours as needed for moderate pain (with food). 08/15/16   Anne-Caroline Sharma Covert, MD  metFORMIN (GLUCOPHAGE) 500 MG tablet Take 1,000 mg by mouth 2 (two) times daily with a meal.     Historical Provider, MD  montelukast (SINGULAIR) 10 MG tablet Take 1 tablet (10 mg total) by mouth daily. 06/05/16 06/05/17  Shane Crutch, MD  omeprazole (PRILOSEC) 40 MG capsule Take 40 mg by mouth daily. 1 hr before supper    Historical Provider, MD  pregabalin (LYRICA) 200 MG capsule Take 200 mg by mouth 2 (two) times daily.    Historical Provider, MD  SUMAtriptan (IMITREX) 25 MG tablet Take 25 mg by mouth every 2 (two) hours as needed for migraine. May repeat in 2 hours if headache persists or recurs.    Historical Provider, MD  tiZANidine (ZANAFLEX) 4 MG tablet Take 4 mg by mouth every 6 (six) hours as needed for muscle spasms.    Historical Provider, MD    Allergies Penicillins and Ace inhibitors  History reviewed. No pertinent family history.  Social History Social History  Substance Use Topics  . Smoking status: Never Smoker  . Smokeless tobacco: Never Used  . Alcohol use No    Review of Systems Constitutional: No fever/chills Eyes: No visual changes. ENT: No sore throat. Nasal congestion Cardiovascular: Respiratory: Denies shortness of breath.Productive cough. Gastrointestinal: No abdominal pain.  No nausea, no vomiting.  No diarrhea.  No constipation. Genitourinary: Negative for dysuria. Musculoskeletal: Chest wall pain secondary to cough Skin: Negative for rash. Neurological: Negative for headaches, but denies focal weakness or numbness. Endocrine:Diabetes  and diabetes. Allergic/Immunilogical: Penicillin and Ace inhibitors  ____________________________________________   PHYSICAL EXAM:  VITAL SIGNS: ED Triage Vitals  Enc Vitals Group     BP 09/27/16 1124 (!) 155/85     Pulse Rate 09/27/16 1124 (!) 110     Resp 09/27/16 1124 18     Temp 09/27/16 1124 99.1 F (37.3 C)     Temp Source 09/27/16 1124 Oral     SpO2 09/27/16 1124 97 %     Weight 09/27/16 1125 200 lb (90.7 kg)     Height 09/27/16 1125 5\' 5"  (1.651 m)     Head Circumference --      Peak Flow --      Pain Score 09/27/16 1126 10     Pain Loc --      Pain Edu? --  Excl. in GC? --     Constitutional: Alert and oriented. Well appearing and in no acute distress. Eyes: Conjunctivae are normal. PERRL. EOMI. Head: Atraumatic. Nose: Edematous nasal turbinates with thick yellowish rhinorrhea  Mouth/Throat: Mucous membranes are moist.  Oropharynx non-erythematous. Copious postnasal drainage Neck: No stridor.  No cervical spine tenderness to palpation.* Hematological/Lymphatic/Immunilogical: No cervical lymphadenopathy. Cardiovascular: Tachycardic. Grossly normal heart sounds.  Good peripheral circulation. Respiratory: Normal respiratory effort.  No retractions. Lungs CTAB. Gastrointestinal: Soft and nontender. No distention. No abdominal bruits. No CVA tenderness. Musculoskeletal: No lower extremity tenderness nor edema.  No joint effusions. Neurologic:  Normal speech and language. No gross focal neurologic deficits are appreciated. No gait instability. Skin:  Skin is warm, dry and intact. No rash noted. Psychiatric: Mood and affect are normal. Speech and behavior are normal.  ____________________________________________   LABS (all labs ordered are listed, but only abnormal results are displayed)  Labs Reviewed - No data to display ____________________________________________  EKG   ____________________________________________  RADIOLOGY  X-ray consistent with  left lower lobe pneumonia. ____________________________________________   PROCEDURES  Procedure(s) performed: None  Procedures  Critical Care performed: No  ____________________________________________   INITIAL IMPRESSION / ASSESSMENT AND PLAN / ED COURSE  Pertinent labs & imaging results that were available during my care of the patient were reviewed by me and considered in my medical decision making (see chart for details).  Left lower lobe pneumonia. Patient given discharge care instruction. Patient given a prescription for Zithromax, but felt DM, and ibuprofen. Patient advised follow-up family doctor if condition persists.      ____________________________________________   FINAL CLINICAL IMPRESSION(S) / ED DIAGNOSES  Final diagnoses:  Community acquired pneumonia of left lower lobe of lung (HCC)      NEW MEDICATIONS STARTED DURING THIS VISIT:  New Prescriptions   AZITHROMYCIN (ZITHROMAX) 500 MG TABLET    Take 1 tablet (500 mg total) by mouth daily. Take 1 tablet daily for 3 days.   BROMPHENIRAMINE-PSEUDOEPHEDRINE-DM 30-2-10 MG/5ML SYRUP    Take 5 mLs by mouth 4 (four) times daily as needed.   IBUPROFEN (ADVIL,MOTRIN) 600 MG TABLET    Take 1 tablet (600 mg total) by mouth every 8 (eight) hours as needed.     Note:  This document was prepared using Dragon voice recognition software and may include unintentional dictation errors.    Joni Reiningonald K Anay Walter, PA-C 09/27/16 1247    Willy EddyPatrick Robinson, MD 09/27/16 [phone]

## 2016-09-27 NOTE — ED Triage Notes (Signed)
Pt states headache, sore throat, spitting up yellow stuff, chest pains. Pt states she feels like she has bronchitis or pneumonia. Hx of those. Pt is alert and oriented. Also states N&V. Denies diarrhea. Fever at home of 102, took tylenol this AM.

## 2016-09-28 DIAGNOSIS — R112 Nausea with vomiting, unspecified: Secondary | ICD-10-CM | POA: Insufficient documentation

## 2016-09-28 DIAGNOSIS — F329 Major depressive disorder, single episode, unspecified: Secondary | ICD-10-CM | POA: Insufficient documentation

## 2016-09-28 DIAGNOSIS — F32A Depression, unspecified: Secondary | ICD-10-CM | POA: Insufficient documentation

## 2016-09-28 DIAGNOSIS — J189 Pneumonia, unspecified organism: Secondary | ICD-10-CM | POA: Insufficient documentation

## 2016-09-28 DIAGNOSIS — E119 Type 2 diabetes mellitus without complications: Secondary | ICD-10-CM | POA: Insufficient documentation

## 2016-10-08 ENCOUNTER — Encounter: Payer: Self-pay | Admitting: Emergency Medicine

## 2016-10-08 ENCOUNTER — Emergency Department: Payer: Medicare Other

## 2016-10-08 ENCOUNTER — Emergency Department
Admission: EM | Admit: 2016-10-08 | Discharge: 2016-10-09 | Disposition: A | Discharge status: Home / Self Care | Payer: Medicare Other | Attending: Emergency Medicine | Admitting: Emergency Medicine

## 2016-10-08 DIAGNOSIS — J45909 Unspecified asthma, uncomplicated: Secondary | ICD-10-CM | POA: Insufficient documentation

## 2016-10-08 DIAGNOSIS — Z79899 Other long term (current) drug therapy: Secondary | ICD-10-CM | POA: Insufficient documentation

## 2016-10-08 DIAGNOSIS — Z794 Long term (current) use of insulin: Secondary | ICD-10-CM | POA: Insufficient documentation

## 2016-10-08 DIAGNOSIS — I10 Essential (primary) hypertension: Secondary | ICD-10-CM | POA: Insufficient documentation

## 2016-10-08 DIAGNOSIS — R091 Pleurisy: Secondary | ICD-10-CM | POA: Insufficient documentation

## 2016-10-08 DIAGNOSIS — E119 Type 2 diabetes mellitus without complications: Secondary | ICD-10-CM | POA: Insufficient documentation

## 2016-10-08 DIAGNOSIS — R0602 Shortness of breath: Secondary | ICD-10-CM | POA: Diagnosis present

## 2016-10-08 DIAGNOSIS — R079 Chest pain, unspecified: Secondary | ICD-10-CM

## 2016-10-08 DIAGNOSIS — K439 Ventral hernia without obstruction or gangrene: Secondary | ICD-10-CM | POA: Diagnosis not present

## 2016-10-08 LAB — BASIC METABOLIC PANEL
Anion gap: 11 (ref 5–15)
BUN: 18 mg/dL (ref 6–20)
CO2: 26 mmol/L (ref 22–32)
Calcium: 9.6 mg/dL (ref 8.9–10.3)
Chloride: 101 mmol/L (ref 101–111)
Creatinine, Ser: 1.12 mg/dL — ABNORMAL HIGH (ref 0.44–1.00)
GFR calc Af Amer: >60 mL/min (ref >60)
GFR calc non Af Amer: 56 mL/min — ABNORMAL LOW (ref >60)
Glucose, Bld: 163 mg/dL — ABNORMAL HIGH (ref 65–99)
Potassium: 3.9 mmol/L (ref 3.5–5.1)
Sodium: 138 mmol/L (ref 135–145)

## 2016-10-08 LAB — CBC
HCT: 41.1 % (ref 35.0–47.0)
Hemoglobin: 13.6 g/dL (ref 12.0–16.0)
MCH: 29.7 pg (ref 26.0–34.0)
MCHC: 33.1 g/dL (ref 32.0–36.0)
MCV: 89.7 fL (ref 80.0–100.0)
Platelets: 288 10*3/uL (ref 150–440)
RBC: 4.59 MIL/uL (ref 3.80–5.20)
RDW: 15 % — ABNORMAL HIGH (ref 11.5–14.5)
WBC: 7.1 10*3/uL (ref 3.6–11.0)

## 2016-10-08 LAB — INFLUENZA PANEL BY PCR (TYPE A & B)
Influenza A By PCR: NEGATIVE
Influenza B By PCR: NEGATIVE

## 2016-10-08 LAB — TROPONIN I: Troponin I: <0.03 ng/mL (ref <0.03)

## 2016-10-08 MED ORDER — KETOROLAC TROMETHAMINE 30 MG/ML IJ SOLN
30.0000 mg | Freq: Once | INTRAMUSCULAR | Status: AC
  Administered 2016-10-08: 30 mg via INTRAVENOUS
  Filled 2016-10-08: qty 1

## 2016-10-08 MED ORDER — ALBUTEROL SULFATE (2.5 MG/3ML) 0.083% IN NEBU
2.5000 mg | INHALATION_SOLUTION | Freq: Once | RESPIRATORY_TRACT | Status: AC
  Administered 2016-10-08: 2.5 mg via RESPIRATORY_TRACT
  Filled 2016-10-08: qty 3

## 2016-10-08 MED ORDER — IOPAMIDOL (ISOVUE-370) INJECTION 76%
75.0000 mL | Freq: Once | INTRAVENOUS | Status: AC | PRN
  Administered 2016-10-08: 75 mL via INTRAVENOUS

## 2016-10-08 MED ORDER — DICLOFENAC SODIUM 3 % TD GEL: 1.0000 "application " | Freq: Two times a day (BID) | TRANSDERMAL | 0 refills | Status: DC | PRN

## 2016-10-08 MED ORDER — SODIUM CHLORIDE 0.9 % IV BOLUS (SEPSIS)
1000.0000 mL | Freq: Once | INTRAVENOUS | Status: AC
  Administered 2016-10-08: 1000 mL via INTRAVENOUS

## 2016-10-08 NOTE — ED Provider Notes (Signed)
[facility] Emergency Department Provider Note  ____________________________________________   First MD Initiated Contact with Patient 10/08/16 1747     (approximate)  I have reviewed the triage vital signs and the nursing notes.   HISTORY  Chief Complaint Shortness of Breath   HPI Amanda [name] is a 51 y.o. female with a history of diabetes, fibromyalgia and recent admission for pneumonia to [facility] who is presenting to the emergency department today with chest pain as well as shortness of breath. She says that she is also continuing to have a productive cough. She says the pain feels like a pressure/tightness across the front of her chest. It is worsened with deep breathing. She is also reporting increased swelling to her bilateral lower extremities with the left lower extremity being greater than the right. She says she is on a water pill and chronically has some swelling but the right is usually greater than the left. She also says that she has been having ongoing abdominal pain over the past year and sees a hernia just above the umbilicus. She says the pain is been worsening over the past year and she has not seen a Careers advisersurgeon.She denies any nausea or vomiting or diarrhea.   Past Medical History:  Diagnosis Date  . Arthritis    knees, shoulder, upper back  . Asthma   . Diabetes mellitus without complication (HCC)   . Fibromyalgia   . GERD (gastroesophageal reflux disease)   . Headache    migraines - 5x/mo  . Hypertension    no meds currently  . Motion sickness    ships  . Thyroid nodule    bilateral and goiter  . Wears contact lenses   . Wears dentures    partial upper    Patient Active Problem List   Diagnosis Date Noted  . Acute respiratory failure with hypoxemia (HCC) 03/18/2016    Past Surgical History:  Procedure Laterality Date  . ABDOMINAL HYSTERECTOMY    . ABDOMINAL SURGERY     laparoscopy x4 with lysis of adhesions  .  APPENDECTOMY    . CESAREAN SECTION    . ECTOPIC PREGNANCY SURGERY    . HERNIA REPAIR    . JOINT REPLACEMENT Right 12/16/12   knee- medial - makoplasty  . KNEE ARTHROSCOPY Right    partial medial and lateral meniscectomies  . KNEE ARTHROSCOPY Right 10/06/2015   Procedure: RIGHT KNEE ARTHROSCOPY WITH DEBRIDEMENT;  Surgeon: Erin SonsHarold Kernodle, MD;  [facility];  Service: Orthopedics;  Laterality: Right;  Diabetic - insulin and oral meds  . ROTATOR CUFF REPAIR Left     Prior to Admission medications   Medication Sig Start Date End Date Taking? Authorizing Provider  albuterol (PROVENTIL HFA) 108 (90 Base) MCG/ACT inhaler Inhale 2 puffs into the lungs every 4 (four) hours as needed for wheezing or shortness of breath. 06/05/16   Shane CrutchPradeep Ramachandran, MD  ALPRAZolam Prudy Feeler(XANAX) 1 MG tablet Take 1 mg by mouth at bedtime as needed for anxiety or sleep.    Historical Provider, MD  ARIPiprazole (ABILIFY) 10 MG tablet Take 10 mg by mouth daily as needed.    Historical Provider, MD  azithromycin (ZITHROMAX) 500 MG tablet Take 1 tablet (500 mg total) by mouth daily. Take 1 tablet daily for 3 days. 09/27/16   Joni Reiningonald K Smith, PA-C  BIOTIN PO Take by mouth daily.    Historical Provider, MD  brompheniramine-pseudoephedrine-DM 30-2-10 MG/5ML syrup Take 5 mLs by mouth 4 (four) times  daily as needed. 09/27/16   Joni Reining, PA-C  canagliflozin (INVOKANA) 300 MG TABS tablet Take 300 mg by mouth daily before breakfast.    Historical Provider, MD  cycloSPORINE (RESTASIS) 0.05 % ophthalmic emulsion 1 drop 2 (two) times daily.    Historical Provider, MD  diltiazem (CARDIZEM CD) 180 MG 24 hr capsule Take 180 mg by mouth daily.    Historical Provider, MD  diltiazem (DILACOR XR) 240 MG 24 hr capsule  04/16/16   Historical Provider, MD  DULoxetine (CYMBALTA) 60 MG capsule Take 60 mg by mouth daily. AM    Historical Provider, MD  flunisolide (NASALIDE) 25 MCG/ACT (0.025%) SOLN Place 2 sprays into the nose daily as  needed.    Historical Provider, MD  fluticasone furoate-vilanterol (BREO ELLIPTA) 200-25 MCG/INH AEPB Inhale 1 puff into the lungs daily. 06/05/16   Shane Crutch, MD  ibuprofen (ADVIL,MOTRIN) 600 MG tablet Take 1 tablet (600 mg total) by mouth every 8 (eight) hours as needed. 09/27/16   Joni Reining, PA-C  insulin detemir (LEVEMIR) 100 UNIT/ML injection Inject 10 Units into the skin at bedtime.    Historical Provider, MD  ipratropium-albuterol (DUONEB) 0.5-2.5 (3) MG/3ML SOLN Take 3 mLs by nebulization every 4 (four) hours as needed.    Historical Provider, MD  isosorbide mononitrate (IMDUR) 30 MG 24 hr tablet Take 30 mg by mouth daily.    Historical Provider, MD  ketorolac (TORADOL) 10 MG tablet Take 1 tablet (10 mg total) by mouth every 8 (eight) hours as needed for moderate pain (with food). 08/15/16   Anne-Caroline Sharma Covert, MD  metFORMIN (GLUCOPHAGE) 500 MG tablet Take 1,000 mg by mouth 2 (two) times daily with a meal.     Historical Provider, MD  montelukast (SINGULAIR) 10 MG tablet Take 1 tablet (10 mg total) by mouth daily. 06/05/16 06/05/17  Shane Crutch, MD  omeprazole (PRILOSEC) 40 MG capsule Take 40 mg by mouth daily. 1 hr before supper    Historical Provider, MD  pregabalin (LYRICA) 200 MG capsule Take 200 mg by mouth 2 (two) times daily.    Historical Provider, MD  SUMAtriptan (IMITREX) 25 MG tablet Take 25 mg by mouth every 2 (two) hours as needed for migraine. May repeat in 2 hours if headache persists or recurs.    Historical Provider, MD  tiZANidine (ZANAFLEX) 4 MG tablet Take 4 mg by mouth every 6 (six) hours as needed for muscle spasms.    Historical Provider, MD    Allergies Penicillins and Ace inhibitors  History reviewed. No pertinent family history.  Social History Social History  Substance Use Topics  . Smoking status: Never Smoker  . Smokeless tobacco: Never Used  . Alcohol use No    Review of Systems Constitutional: No fever/chills Eyes: No visual  changes. ENT: No sore throat. Cardiovascular:as above Respiratory: Denies shortness of breath. Gastrointestinal:  No nausea, no vomiting.  No diarrhea.  No constipation. Genitourinary: Negative for dysuria. Musculoskeletal: Negative for back pain. Skin: Negative for rash. Neurological: Negative for headaches, focal weakness or numbness.  10-point ROS otherwise negative.  ____________________________________________   PHYSICAL EXAM:  VITAL SIGNS: ED Triage Vitals  Enc Vitals Group     BP 10/08/16 1513 104/66     Pulse Rate 10/08/16 1513 (!) 105     Resp 10/08/16 1513 20     Temp 10/08/16 1512 98.6 F (37 C)     Temp Source 10/08/16 1512 Oral     SpO2 10/08/16 1513 98 %  Weight 10/08/16 1512 200 lb (90.7 kg)     Height 10/08/16 1512 5\' 5"  (1.651 m)     Head Circumference --      Peak Flow --      Pain Score 10/08/16 1513 10     Pain Loc --      Pain Edu? --      Excl. in GC? --     Constitutional: Alert and oriented. Well appearing and in no acute distress. Eyes: Conjunctivae are normal. PERRL. EOMI. Head: Atraumatic. Nose: No congestion/rhinnorhea. Mouth/Throat: Mucous membranes are moist.   Neck: No stridor.   Cardiovascular: Normal rate, regular rhythm. Heart rate of 91 bpm in the room. Grossly normal heart sounds.  Chest pain is reproducible with palpation across the anterior of the chest especially over the sternum. Respiratory: Normal respiratory effort.  No retractions. Lungs CTAB. Gastrointestinal: Soft with minimal left lower quadrant tenderness palpation. When the patient strains she is able to force a hernia sac out just above the umbilicus. However, when she is calm there is no hernia sac palpated and there is no tenderness palpation. No overlying erythema or induration. No distention.  Musculoskeletal: Mild bilateral lower extremity edema with the left lower extremity being slightly greater than ankle than the right. Neurologic:  Normal speech and  language. No gross focal neurologic deficits are appreciated.  Skin:  Skin is warm, dry and intact. No rash noted. Psychiatric: Mood and affect are normal. Speech and behavior are normal.  ____________________________________________   LABS (all labs ordered are listed, but only abnormal results are displayed)  Labs Reviewed  BASIC METABOLIC PANEL - Abnormal; Notable for the following:       Result Value   Glucose, Bld 163 (*)    Creatinine, Ser 1.12 (*)    GFR calc non Af Amer 56 (*)    All other components within normal limits  CBC - Abnormal; Notable for the following:    RDW 15.0 (*)    All other components within normal limits  TROPONIN I  INFLUENZA PANEL BY PCR (TYPE A & B)  POC URINE PREG, ED   ____________________________________________  EKG  ED ECG REPORT I, Arelia Longest, the attending physician, personally viewed and interpreted this ECG.   Date: 10/08/2016  EKG Time: 1520  Rate: 103  Rhythm: sinus tachycardia  Axis: Normal  Intervals:none  ST&T Change: No ST segment elevation or depression. No abnormal T-wave inversion.  ____________________________________________  RADIOLOGY  Study Result   CLINICAL DATA:  Chest pain and shortness of Breath  EXAM: CHEST  2 VIEW  COMPARISON:  None.  FINDINGS: The heart size and mediastinal contours are within normal limits. Both lungs are clear. The visualized skeletal structures are unremarkable.  IMPRESSION: No active cardiopulmonary disease.   Electronically Signed   By: Alcide Clever M.D.   On: 10/08/2016 15:45  CT Angio Chest PE W and/or Wo Contrast (Final result)  Result time 10/08/16 20:31:02  Final result by Alcide Clever, MD (10/08/16 20:31:02)           Narrative:   CLINICAL DATA: Shortness of breath and cough  EXAM: CT ANGIOGRAPHY CHEST WITH CONTRAST  TECHNIQUE: Multidetector CT imaging of the chest was performed using the standard protocol during bolus administration of  intravenous contrast. Multiplanar CT image reconstructions and MIPs were obtained to evaluate the vascular anatomy.  CONTRAST: 75 mL Isovue 370.  COMPARISON: None.  FINDINGS: Cardiovascular: The thoracic aorta is within normal limits without aneurysmal dilatation  or dissection. The pulmonary artery shows a normal branching pattern without intraluminal filling defect to suggest pulmonary embolism. The heart is mildly enlarged. No significant coronary calcifications are seen. Mild pericardial effusion is seen  Mediastinum/Nodes: No significant lymphadenopathy is noted. The thoracic inlet is within normal limits.  Lungs/Pleura: The lungs are well aerated bilaterally. No focal infiltrate or sizable effusion is seen.  Upper Abdomen: Diffuse fatty infiltration of the liver is noted. No other focal abnormality is seen.  Musculoskeletal: No acute abnormality noted. No evidence of pulmonary emboli.  Fatty liver.  Mild pericardial effusion.  Review of the MIP images confirms the above findings.   Electronically Signed By: Alcide Clever M.D. On: 10/08/2016 20:31            US Venous Img Lower Bilateral (Final result)  Result time 10/08/16 20:23:35  Final result by Adrian Prows, MD (10/08/16 20:23:35)           Narrative:   CLINICAL DATA: Bilateral lower extremity edema, left greater than right, bilateral lower extremity pain  EXAM: BILATERAL LOWER EXTREMITY VENOUS DOPPLER ULTRASOUND  TECHNIQUE: Gray-scale sonography with graded compression, as well as color Doppler and duplex ultrasound were performed to evaluate the lower extremity deep venous systems from the level of the common femoral vein and including the common femoral, femoral, profunda femoral, popliteal and calf veins including the posterior tibial, peroneal and gastrocnemius veins when visible. The superficial great saphenous vein was also interrogated. Spectral Doppler was utilized to  evaluate flow at rest and with distal augmentation maneuvers in the common femoral, femoral and popliteal veins.  COMPARISON: None.  FINDINGS: RIGHT LOWER EXTREMITY  Common Femoral Vein: No evidence of thrombus. Normal compressibility, respiratory phasicity and response to augmentation.  Saphenofemoral Junction: No evidence of thrombus. Normal compressibility and flow on color Doppler imaging.  Profunda Femoral Vein: No evidence of thrombus. Normal compressibility and flow on color Doppler imaging.  Femoral Vein: No evidence of thrombus. Normal compressibility, respiratory phasicity and response to augmentation.  Popliteal Vein: No evidence of thrombus. Normal compressibility, respiratory phasicity and response to augmentation.  Calf Veins: No evidence of thrombus. Normal compressibility and flow on color Doppler imaging.  Venous Reflux: None.  Other Findings: None.  LEFT LOWER EXTREMITY  Common Femoral Vein: No evidence of thrombus. Normal compressibility, respiratory phasicity and response to augmentation.  Saphenofemoral Junction: No evidence of thrombus. Normal compressibility and flow on color Doppler imaging.  Profunda Femoral Vein: No evidence of thrombus. Normal compressibility and flow on color Doppler imaging.  Femoral Vein: No evidence of thrombus. Normal compressibility, respiratory phasicity and response to augmentation.  Popliteal Vein: No evidence of thrombus. Normal compressibility, respiratory phasicity and response to augmentation.  Calf Veins: No evidence of thrombus. Normal compressibility and flow on color Doppler imaging.  Venous Reflux: None.  Other Findings: None.  IMPRESSION: No evidence of deep venous thrombosis.   Electronically Signed By: Jasmine Pang M.D. On: 10/08/2016 20:23           ____________________________________________   PROCEDURES  Procedure(s) performed:   Procedures  Critical Care  performed:   ____________________________________________   INITIAL IMPRESSION / ASSESSMENT AND PLAN / ED COURSE  Pertinent labs & imaging results that were available during my care of the patient were reviewed by me and considered in my medical decision making (see chart for details).  ----------------------------------------- 11:17 PM on 10/08/2016 -----------------------------------------  Patient resting without any distress this time. Says feels better with the medications. Likely pleurisy from her multiple  recent respiratory illnesses. Very reassuring workup without any PE or DVT. Patient to follow-up with her primary care doctor. We'll discharge with diclofenac gel. We discussed the lab results as well as imaging results and she is understanding the plan and willing to comply.      ____________________________________________   FINAL CLINICAL IMPRESSION(S) / ED DIAGNOSES  Chest pain. Pleurisy. Shortness of breath. Reducible hernia.    NEW MEDICATIONS STARTED DURING THIS VISIT:  New Prescriptions   No medications on file     Note:  This document was prepared using Dragon voice recognition software and may include unintentional dictation errors.    Myrna Blazer, MD 10/08/16 [id number]

## 2016-10-08 NOTE — ED Triage Notes (Signed)
Pt reports admitted here PNA and was discharged. She wasn't feeling much better so went to [facility] and was admitted for 3 days there for same.  Discharged Sunday and still having SHOB and cough.  Does not feel any better.  Was given tamiflu also in case had flu.

## 2016-10-08 NOTE — ED Notes (Signed)
Pt to CT

## 2016-10-08 NOTE — ED Triage Notes (Signed)
Also c/o tightness in chest as well.

## 2016-10-08 NOTE — ED Notes (Signed)
Pt transported to ultrasound. Informs the ultrasound tech that every pressure is applied pressure to her leg for the ultrasound. MD aware. VS taken; within normal limits.

## 2016-10-08 NOTE — ED Notes (Addendum)
Patient has taking z-pak, levaquin and is not getting over the PNA. Has chest pain and back pain. Generalized pain all over. Pt reports having a hernia in her abdomen and is having pain in the abdomen. Patient also reports swelling in her feet and legs and that she is on a fluid pill. Pt took medications today. Patient reports being sick for about a month and a half

## 2016-10-08 NOTE — ED Notes (Signed)
Pt returned to exam room by ultrasound tech.

## 2016-10-11 ENCOUNTER — Other Ambulatory Visit: Payer: Self-pay | Admitting: Internal Medicine

## 2016-10-11 DIAGNOSIS — Z1231 Encounter for screening mammogram for malignant neoplasm of breast: Secondary | ICD-10-CM

## 2016-10-14 ENCOUNTER — Telehealth: Payer: Self-pay | Admitting: Internal Medicine

## 2016-10-14 NOTE — Telephone Encounter (Signed)
Please advise if you have these allergy test results. Pt is requesting results.

## 2016-10-14 NOTE — Telephone Encounter (Signed)
Pt calling asking about her allergy test results.  Please call back with those

## 2016-10-15 ENCOUNTER — Other Ambulatory Visit: Payer: Self-pay

## 2016-10-15 ENCOUNTER — Encounter: Payer: Medicare Other | Attending: Internal Medicine | Admitting: Respiratory Therapy

## 2016-10-15 VITALS — Ht 66.0 in | Wt 195.4 lb

## 2016-10-15 DIAGNOSIS — G4730 Sleep apnea, unspecified: Secondary | ICD-10-CM | POA: Diagnosis not present

## 2016-10-15 NOTE — Patient Instructions (Signed)
Patient Instructions  Patient Details  Name: Amanda [name] MRN: [id number] Date of Birth: [birth year] Referring Provider:  McLean-Scocuzza, French Anaracy *  Below are the personal goals you chose as well as exercise and nutrition goals. Our goal is to help you keep on track towards obtaining and maintaining your goals. We will be discussing your progress on these goals with you throughout the program.  Initial Exercise Prescription:     Initial Exercise Prescription - 10/15/16 1600      Date of Initial Exercise RX and Referring Provider   Date 10/15/16   Referring Provider Shirlee LatchMcLean     Treadmill   MPH 2.5   Grade 0   Minutes 15   METs 2.91     NuStep   Level 3   Minutes 15   METs 2.5     REL-XR   Level 3   Minutes 15   METs 2.5     Prescription Details   Frequency (times per week) 3   Duration Progress to 45 minutes of aerobic exercise without signs/symptoms of physical distress     Intensity   THRR 40-80% of Max Heartrate 118-152   Ratings of Perceived Exertion 11-15   Perceived Dyspnea 0-4     Progression   Progression Continue to progress workloads to maintain intensity without signs/symptoms of physical distress.     Resistance Training   Training Prescription Yes   Weight 3   Reps 10-15      Exercise Goals: Frequency: Be able to perform aerobic exercise three times per week working toward 3-5 days per week.  Intensity: Work with a perceived exertion of 11 (fairly light) - 15 (hard) as tolerated. Follow your new exercise prescription and watch for changes in prescription as you progress with the program. Changes will be reviewed with you when they are made.  Duration: You should be able to do 30 minutes of continuous aerobic exercise in addition to a 5 minute warm-up and a 5 minute cool-down routine.  Nutrition Goals: Your personal nutrition goals will be established when you do your nutrition analysis with the dietician.  The following are nutrition  guidelines to follow: Cholesterol < 200mg /day Sodium < 1500mg /day Fiber: Women over 50 yrs - 21 grams per day  Personal Goals:     Personal Goals and Risk Factors at Admission - 10/15/16 1627      Core Components/Risk Factors/Patient Goals on Admission    Weight Management Yes;Weight Loss   Intervention Weight Management: Develop a combined nutrition and exercise program designed to reach desired caloric intake, while maintaining appropriate intake of nutrient and fiber, sodium and fats, and appropriate energy expenditure required for the weight goal.;Weight Management: Provide education and appropriate resources to help participant work on and attain dietary goals.   Admit Weight 195 lb 6.4 oz (88.6 kg)   Goal Weight: Short Term 190 lb (86.2 kg)   Goal Weight: Long Term 140 lb (63.5 kg)   Expected Outcomes Short Term: Continue to assess and modify interventions until short term weight is achieved;Long Term: Adherence to nutrition and physical activity/exercise program aimed toward attainment of established weight goal;Weight Maintenance: Understanding of the daily nutrition guidelines, which includes 25-35% calories from fat, 7% or less cal from saturated fats, less than 200mg  cholesterol, less than 1.5gm of sodium, & 5 or more servings of fruits and vegetables daily;Weight Loss: Understanding of general recommendations for a balanced deficit meal plan, which promotes 1-2 lb weight loss per week and includes a negative  energy balance of [phone] kcal/d;Understanding recommendations for meals to include 15-35% energy as protein, 25-35% energy from fat, 35-60% energy from carbohydrates, less than 200mg  of dietary cholesterol, 20-35 gm of total fiber daily;Understanding of distribution of calorie intake throughout the day with the consumption of 4-5 meals/snacks   Sedentary Yes   Intervention Provide advice, education, support and counseling about physical activity/exercise needs.;Develop an  individualized exercise prescription for aerobic and resistive training based on initial evaluation findings, risk stratification, comorbidities and participant's personal goals.   Expected Outcomes Achievement of increased cardiorespiratory fitness and enhanced flexibility, muscular endurance and strength shown through measurements of functional capacity and personal statement of participant.   Increase Strength and Stamina Yes   Intervention Provide advice, education, support and counseling about physical activity/exercise needs.;Develop an individualized exercise prescription for aerobic and resistive training based on initial evaluation findings, risk stratification, comorbidities and participant's personal goals.   Expected Outcomes Achievement of increased cardiorespiratory fitness and enhanced flexibility, muscular endurance and strength shown through measurements of functional capacity and personal statement of participant.   Improve shortness of breath with ADL's Yes   Intervention Provide education, individualized exercise plan and daily activity instruction to help decrease symptoms of SOB with activities of daily living.   Expected Outcomes Short Term: Achieves a reduction of symptoms when performing activities of daily living.   Develop more efficient breathing techniques such as purse lipped breathing and diaphragmatic breathing; and practicing self-pacing with activity Yes   Intervention Provide education, demonstration and support about specific breathing techniuqes utilized for more efficient breathing. Include techniques such as pursed lipped breathing, diaphragmatic breathing and self-pacing activity.   Expected Outcomes Short Term: Participant will be able to demonstrate and use breathing techniques as needed throughout daily activities.   Increase knowledge of respiratory medications and ability to use respiratory devices properly  Yes  Breo, Duoneb, ProAir; spacer given    Intervention Provide education and demonstration as needed of appropriate use of medications, inhalers, and oxygen therapy.   Expected Outcomes Short Term: Achieves understanding of medications use. Understands that oxygen is a medication prescribed by physician. Demonstrates appropriate use of inhaler and oxygen therapy.   Diabetes Yes   Intervention Provide education about signs/symptoms and action to take for hypo/hyperglycemia.;Provide education about proper nutrition, including hydration, and aerobic/resistive exercise prescription along with prescribed medications to achieve blood glucose in normal ranges: Fasting glucose 65-99 mg/dL   Expected Outcomes Short Term: Participant verbalizes understanding of the signs/symptoms and immediate care of hyper/hypoglycemia, proper foot care and importance of medication, aerobic/resistive exercise and nutrition plan for blood glucose control.;Long Term: Attainment of HbA1C < 7%.   Hypertension Yes   Intervention Provide education on lifestyle modifcations including regular physical activity/exercise, weight management, moderate sodium restriction and increased consumption of fresh fruit, vegetables, and low fat dairy, alcohol moderation, and smoking cessation.;Monitor prescription use compliance.   Expected Outcomes Short Term: Continued assessment and intervention until BP is < 140/28mm HG in hypertensive participants. < 130/57mm HG in hypertensive participants with diabetes, heart failure or chronic kidney disease.;Long Term: Maintenance of blood pressure at goal levels.   Lipids Yes   Intervention Provide education and support for participant on nutrition & aerobic/resistive exercise along with prescribed medications to achieve LDL 70mg , HDL >40mg .   Expected Outcomes Short Term: Participant states understanding of desired cholesterol values and is compliant with medications prescribed. Participant is following exercise prescription and nutrition  guidelines.;Long Term: Cholesterol controlled with medications as prescribed, with individualized exercise RX  and with personalized nutrition plan. Value goals: LDL < 70mg , HDL > 40 mg.      Tobacco Use Initial Evaluation: History  Smoking Status   Never Smoker  Smokeless Tobacco   Never Used    Copy of goals given to participant.

## 2016-10-15 NOTE — Progress Notes (Signed)
Pulmonary Individual Treatment Plan  Patient Details  Name: Amanda [name] MRN: [id number] Date of Birth: [birth year] Referring Provider:   Flowsheet Row Pulmonary Rehab from 10/15/2016 in [facility] Cardiac and Pulmonary Rehab  Referring Provider  Shirlee Latch      Initial Encounter Date:  Flowsheet Row Pulmonary Rehab from 10/15/2016 in [facility] Cardiac and Pulmonary Rehab  Date  10/15/16  Referring Provider  Shirlee Latch      Visit Diagnosis: Sleep apnea, unspecified type  Patient's Home Medications on Admission:  Current Outpatient Prescriptions:    acetaminophen (TYLENOL) 325 MG tablet, Take 650 mg by mouth 1 day or 1 dose., Disp: , Rfl:    albuterol (PROVENTIL HFA) 108 (90 Base) MCG/ACT inhaler, Inhale 2 puffs into the lungs every 4 (four) hours as needed for wheezing or shortness of breath., Disp: 1 Inhaler, Rfl: 3   ALPRAZolam (XANAX) 1 MG tablet, Take 1 mg by mouth at bedtime as needed for anxiety or sleep., Disp: , Rfl:    ARIPiprazole (ABILIFY) 10 MG tablet, Take 10 mg by mouth daily as needed., Disp: , Rfl:    azithromycin (ZITHROMAX) 500 MG tablet, Take 1 tablet (500 mg total) by mouth daily. Take 1 tablet daily for 3 days., Disp: 3 tablet, Rfl: 0   BIOTIN PO, Take by mouth daily., Disp: , Rfl:    brompheniramine-pseudoephedrine-DM 30-2-10 MG/5ML syrup, Take 5 mLs by mouth 4 (four) times daily as needed., Disp: 120 mL, Rfl: 0   canagliflozin (INVOKANA) 300 MG TABS tablet, Take 300 mg by mouth daily before breakfast., Disp: , Rfl:    cycloSPORINE (RESTASIS) 0.05 % ophthalmic emulsion, 1 drop 2 (two) times daily., Disp: , Rfl:    dexmethylphenidate (FOCALIN) 10 MG tablet, Take 10 mg by mouth 2 (two) times daily., Disp: , Rfl:    Diclofenac Sodium 3 % GEL, Place 1 application onto the skin every 12 (twelve) hours as needed., Disp: 50 g, Rfl: 0   diltiazem (CARDIZEM CD) 180 MG 24 hr capsule, Take 180 mg by mouth daily., Disp: , Rfl:    diltiazem (DILACOR XR) 240 MG 24 hr capsule, ,  Disp: , Rfl:    DULoxetine (CYMBALTA) 60 MG capsule, Take 60 mg by mouth daily. AM, Disp: , Rfl:    flunisolide (NASALIDE) 25 MCG/ACT (0.025%) SOLN, Place 2 sprays into the nose daily as needed., Disp: , Rfl:    fluticasone furoate-vilanterol (BREO ELLIPTA) 200-25 MCG/INH AEPB, Inhale 1 puff into the lungs daily., Disp: 60 each, Rfl: 5   ibuprofen (ADVIL,MOTRIN) 600 MG tablet, Take 1 tablet (600 mg total) by mouth every 8 (eight) hours as needed., Disp: 15 tablet, Rfl: 0   insulin detemir (LEVEMIR) 100 UNIT/ML injection, Inject 10 Units into the skin at bedtime., Disp: , Rfl:    ipratropium-albuterol (DUONEB) 0.5-2.5 (3) MG/3ML SOLN, Take 3 mLs by nebulization every 4 (four) hours as needed., Disp: , Rfl:    isosorbide mononitrate (IMDUR) 30 MG 24 hr tablet, Take 30 mg by mouth daily., Disp: , Rfl:    ketorolac (TORADOL) 10 MG tablet, Take 1 tablet (10 mg total) by mouth every 8 (eight) hours as needed for moderate pain (with food)., Disp: 15 tablet, Rfl: 0   metFORMIN (GLUCOPHAGE) 500 MG tablet, Take 1,000 mg by mouth 2 (two) times daily with a meal. , Disp: , Rfl:    montelukast (SINGULAIR) 10 MG tablet, Take 1 tablet (10 mg total) by mouth daily., Disp: 30 tablet, Rfl: 2   omeprazole (PRILOSEC) 40 MG capsule,  Take 40 mg by mouth daily. 1 hr before supper, Disp: , Rfl:    pregabalin (LYRICA) 200 MG capsule, Take 200 mg by mouth 2 (two) times daily., Disp: , Rfl:    SUMAtriptan (IMITREX) 25 MG tablet, Take 25 mg by mouth every 2 (two) hours as needed for migraine. May repeat in 2 hours if headache persists or recurs., Disp: , Rfl:    tiZANidine (ZANAFLEX) 4 MG tablet, Take 4 mg by mouth every 6 (six) hours as needed for muscle spasms., Disp: , Rfl:   Past Medical History: Past Medical History:  Diagnosis Date   Arthritis    knees, shoulder, upper back   Asthma    Diabetes mellitus without complication (HCC)    Fibromyalgia    GERD (gastroesophageal reflux disease)     Headache    migraines - 5x/mo   Hypertension    no meds currently   Motion sickness    ships   Thyroid nodule    bilateral and goiter   Wears contact lenses    Wears dentures    partial upper    Tobacco Use: History  Smoking Status   Never Smoker  Smokeless Tobacco   Never Used    Labs: Recent Review Flowsheet Data    There is no flowsheet data to display.       ADL UCSD:     Pulmonary Assessment Scores    Row Name 10/15/16 1623         ADL UCSD   ADL Phase Entry     SOB Score total 103     Rest 0     Walk 4     Stairs 5     Bath 3     Dress 3     Shop 5        Pulmonary Function Assessment:     Pulmonary Function Assessment - 10/15/16 1621      Pulmonary Function Tests   RV% 54 %   DLCO% 85 %     Initial Spirometry Results   FVC% 58 %   FEV1% 65 %   FEV1/FVC Ratio 91     Post Bronchodilator Spirometry Results   FVC% 75 %   FEV1% 86 %   FEV1/FVC Ratio 92     Breath   Shortness of Breath Yes;Limiting activity;Fear of Shortness of Breath      Exercise Target Goals: Date: 10/15/16  Exercise Program Goal: Individual exercise prescription set with THRR, safety & activity barriers. Participant demonstrates ability to understand and report RPE using BORG scale, to self-measure pulse accurately, and to acknowledge the importance of the exercise prescription.  Exercise Prescription Goal: Starting with aerobic activity 30 plus minutes a day, 3 days per week for initial exercise prescription. Provide home exercise prescription and guidelines that participant acknowledges understanding prior to discharge.  Activity Barriers & Risk Stratification:     Activity Barriers & Cardiac Risk Stratification - 10/15/16 1621      Activity Barriers & Cardiac Risk Stratification   Activity Barriers Shortness of Breath;Fibromyalgia;Joint Problems;Deconditioning;Muscular Weakness   Cardiac Risk Stratification Moderate      6 Minute Walk:     6  Minute Walk    Row Name 10/15/16 1609         6 Minute Walk   Phase Initial     Distance 1035 feet     Walk Time 6 minutes     # of Rest Breaks 0     MPH  1.96     RPE 15     Perceived Dyspnea  3     VO2 Peak 10.65     Symptoms No     Resting HR 85 bpm     Resting BP 104/68     Max Ex. HR 102 bpm     Max Ex. BP 104/62       Interval HR   Baseline HR 85     1 Minute HR 88     2 Minute HR 84     3 Minute HR 100     4 Minute HR 98     5 Minute HR 102     6 Minute HR 99     2 Minute Post HR 82     Interval Heart Rate? Yes       Interval Oxygen   Interval Oxygen? Yes     Baseline Oxygen Saturation % 98 %     1 Minute Oxygen Saturation % 94 %     2 Minute Oxygen Saturation % 93 %     3 Minute Oxygen Saturation % 93 %     4 Minute Oxygen Saturation % 89 %     5 Minute Oxygen Saturation % 93 %     6 Minute Oxygen Saturation % 94 %        Initial Exercise Prescription:     Initial Exercise Prescription - 10/15/16 1600      Date of Initial Exercise RX and Referring Provider   Date 10/15/16   Referring Provider Shirlee Latch     Treadmill   MPH 2.5   Grade 0   Minutes 15   METs 2.91     NuStep   Level 3   Minutes 15   METs 2.5     REL-XR   Level 3   Minutes 15   METs 2.5     Prescription Details   Frequency (times per week) 3   Duration Progress to 45 minutes of aerobic exercise without signs/symptoms of physical distress     Intensity   THRR 40-80% of Max Heartrate 118-152   Ratings of Perceived Exertion 11-15   Perceived Dyspnea 0-4     Progression   Progression Continue to progress workloads to maintain intensity without signs/symptoms of physical distress.     Resistance Training   Training Prescription Yes   Weight 3   Reps 10-15      Perform Capillary Blood Glucose checks as needed.  Exercise Prescription Changes:   Exercise Comments:   Discharge Exercise Prescription (Final Exercise Prescription Changes):    Nutrition:  Target  Goals: Understanding of nutrition guidelines, daily intake of sodium 1500mg , cholesterol 200mg , calories 30% from fat and 7% or less from saturated fats, daily to have 5 or more servings of fruits and vegetables.  Biometrics:     Pre Biometrics - 10/15/16 1607      Pre Biometrics   Height 5\' 6"  (1.676 m)   Weight 195 lb 6.4 oz (88.6 kg)   Waist Circumference 37 inches   Hip Circumference 43.25 inches   Waist to Hip Ratio 0.86 %   BMI (Calculated) 31.6       Nutrition Therapy Plan and Nutrition Goals:   Nutrition Discharge: Rate Your Plate Scores:   Psychosocial: Target Goals: Acknowledge presence or absence of depression, maximize coping skills, provide positive support system. Participant is able to verbalize types and ability to use techniques and skills needed for reducing stress  and depression.  Initial Review & Psychosocial Screening:     Initial Psych Review & Screening - 10/15/16 1630      Family Dynamics   Good Support System? Yes   Comments [name] has good support from her children and boyfriend. Every three months, she has an appointment with Dr Omelia Blackwater for psycological therapy. She has had a difficult past, but she is very positive about herself now and is looking forward to [facility].     Screening Interventions   Interventions Encouraged to exercise;Program counselor consult      Quality of Life Scores:     Quality of Life - 10/15/16 1634      Quality of Life Scores   Health/Function Pre 21 %   Socioeconomic Pre 21 %   Psych/Spiritual Pre 21 %   Family Pre 21 %   GLOBAL Pre 21 %      PHQ-9: Recent Review Flowsheet Data    Depression screen [facility] 2/9 10/15/2016   Decreased Interest 3   Down, Depressed, Hopeless 3   PHQ - 2 Score 6   Altered sleeping 3   Tired, decreased energy 3   Change in appetite 3   Feeling bad or failure about yourself  3   Trouble concentrating 3   Moving slowly or fidgety/restless 3   PHQ-9 Score 24   Difficult  doing work/chores Extremely dIfficult      Psychosocial Evaluation and Intervention:   Psychosocial Re-Evaluation:  Education: Education Goals: Education classes will be provided on a weekly basis, covering required topics. Participant will state understanding/return demonstration of topics presented.  Learning Barriers/Preferences:     Learning Barriers/Preferences - 10/15/16 1621      Learning Barriers/Preferences   Learning Barriers None   Learning Preferences None      Education Topics: Initial Evaluation Education: - Verbal, written and demonstration of respiratory meds, RPE/PD scales, oximetry and breathing techniques. Instruction on use of nebulizers and MDIs: cleaning and proper use, rinsing mouth with steroid doses and importance of monitoring MDI activations. Flowsheet Row Pulmonary Rehab from 10/15/2016 in [facility] Cardiac and Pulmonary Rehab  Date  10/15/16  Educator  LB  Instruction Review Code  2- meets goals/outcomes      General Nutrition Guidelines/Fats and Fiber: -Group instruction provided by verbal, written material, models and posters to present the general guidelines for heart healthy nutrition. Gives an explanation and review of dietary fats and fiber.   Controlling Sodium/Reading Food Labels: -Group verbal and written material supporting the discussion of sodium use in heart healthy nutrition. Review and explanation with models, verbal and written materials for utilization of the food label.   Exercise Physiology & Risk Factors: - Group verbal and written instruction with models to review the exercise physiology of the cardiovascular system and associated critical values. Details cardiovascular disease risk factors and the goals associated with each risk factor.   Aerobic Exercise & Resistance Training: - Gives group verbal and written discussion on the health impact of inactivity. On the components of aerobic and resistive training programs and the  benefits of this training and how to safely progress through these programs.   Flexibility, Balance, General Exercise Guidelines: - Provides group verbal and written instruction on the benefits of flexibility and balance training programs. Provides general exercise guidelines with specific guidelines to those with heart or lung disease. Demonstration and skill practice provided.   Stress Management: - Provides group verbal and written instruction about the health risks of elevated stress, cause  of high stress, and healthy ways to reduce stress.   Depression: - Provides group verbal and written instruction on the correlation between heart/lung disease and depressed mood, treatment options, and the stigmas associated with seeking treatment.   Exercise & Equipment Safety: - Individual verbal instruction and demonstration of equipment use and safety with use of the equipment.   Infection Prevention: - Provides verbal and written material to individual with discussion of infection control including proper hand washing and proper equipment cleaning during exercise session.   Falls Prevention: - Provides verbal and written material to individual with discussion of falls prevention and safety.   Diabetes: - Individual verbal and written instruction to review signs/symptoms of diabetes, desired ranges of glucose level fasting, after meals and with exercise. Advice that pre and post exercise glucose checks will be done for 3 sessions at entry of program. Flowsheet Row Pulmonary Rehab from 10/15/2016 in [facility] Cardiac and Pulmonary Rehab  Date  10/15/16  Educator  LB  Instruction Review Code  2- meets goals/outcomes      Chronic Lung Diseases: - Group verbal and written instruction to review new updates, new respiratory medications, new advancements in procedures and treatments. Provide informative websites and "800" numbers of self-education.   Lung Procedures: - Group verbal and written  instruction to describe testing methods done to diagnose lung disease. Review the outcome of test results. Describe the treatment choices: Pulmonary Function Tests, ABGs and oximetry.   Energy Conservation: - Provide group verbal and written instruction for methods to conserve energy, plan and organize activities. Instruct on pacing techniques, use of adaptive equipment and posture/positioning to relieve shortness of breath.   Triggers: - Group verbal and written instruction to review types of environmental controls: home humidity, furnaces, filters, dust mite/pet prevention, HEPA vacuums. To discuss weather changes, air quality and the benefits of nasal washing.   Exacerbations: - Group verbal and written instruction to provide: warning signs, infection symptoms, calling MD promptly, preventive modes, and value of vaccinations. Review: effective airway clearance, coughing and/or vibration techniques. Create an Sport and exercise psychologist.   Oxygen: - Individual and group verbal and written instruction on oxygen therapy. Includes supplement oxygen, available portable oxygen systems, continuous and intermittent flow rates, oxygen safety, concentrators, and Medicare reimbursement for oxygen.   Respiratory Medications: - Group verbal and written instruction to review medications for lung disease. Drug class, frequency, complications, importance of spacers, rinsing mouth after steroid MDI's, and proper cleaning methods for nebulizers. Flowsheet Row Pulmonary Rehab from 10/15/2016 in [facility] Cardiac and Pulmonary Rehab  Date  10/15/16  Educator  LB  Instruction Review Code  2- meets goals/outcomes      AED/CPR: - Group verbal and written instruction with the use of models to demonstrate the basic use of the AED with the basic ABC's of resuscitation.   Breathing Retraining: - Provides individuals verbal and written instruction on purpose, frequency, and proper technique of diaphragmatic breathing and  pursed-lipped breathing. Applies individual practice skills. Flowsheet Row Pulmonary Rehab from 10/15/2016 in [facility] Cardiac and Pulmonary Rehab  Date  10/15/16  Educator  LB  Instruction Review Code  2- meets goals/outcomes      Anatomy and Physiology of the Lungs: - Group verbal and written instruction with the use of models to provide basic lung anatomy and physiology related to function, structure and complications of lung disease.   Heart Failure: - Group verbal and written instruction on the basics of heart failure: signs/symptoms, treatments, explanation of ejection fraction, enlarged heart  and cardiomyopathy.   Sleep Apnea: - Individual verbal and written instruction to review Obstructive Sleep Apnea. Review of risk factors, methods for diagnosing and types of masks and machines for OSA.   Anxiety: - Provides group, verbal and written instruction on the correlation between heart/lung disease and anxiety, treatment options, and management of anxiety.   Relaxation: - Provides group, verbal and written instruction about the benefits of relaxation for patients with heart/lung disease. Also provides patients with examples of relaxation techniques.   Knowledge Questionnaire Score:     Knowledge Questionnaire Score - 10/15/16 1621      Knowledge Questionnaire Score   Pre Score 10/10       Core Components/Risk Factors/Patient Goals at Admission:     Personal Goals and Risk Factors at Admission - 10/15/16 1627      Core Components/Risk Factors/Patient Goals on Admission    Weight Management Yes;Weight Loss   Intervention Weight Management: Develop a combined nutrition and exercise program designed to reach desired caloric intake, while maintaining appropriate intake of nutrient and fiber, sodium and fats, and appropriate energy expenditure required for the weight goal.;Weight Management: Provide education and appropriate resources to help participant work on and attain  dietary goals.   Admit Weight 195 lb 6.4 oz (88.6 kg)   Goal Weight: Short Term 190 lb (86.2 kg)   Goal Weight: Long Term 140 lb (63.5 kg)   Expected Outcomes Short Term: Continue to assess and modify interventions until short term weight is achieved;Long Term: Adherence to nutrition and physical activity/exercise program aimed toward attainment of established weight goal;Weight Maintenance: Understanding of the daily nutrition guidelines, which includes 25-35% calories from fat, 7% or less cal from saturated fats, less than 200mg  cholesterol, less than 1.5gm of sodium, & 5 or more servings of fruits and vegetables daily;Weight Loss: Understanding of general recommendations for a balanced deficit meal plan, which promotes 1-2 lb weight loss per week and includes a negative energy balance of [phone] kcal/d;Understanding recommendations for meals to include 15-35% energy as protein, 25-35% energy from fat, 35-60% energy from carbohydrates, less than 200mg  of dietary cholesterol, 20-35 gm of total fiber daily;Understanding of distribution of calorie intake throughout the day with the consumption of 4-5 meals/snacks   Sedentary Yes   Intervention Provide advice, education, support and counseling about physical activity/exercise needs.;Develop an individualized exercise prescription for aerobic and resistive training based on initial evaluation findings, risk stratification, comorbidities and participant's personal goals.   Expected Outcomes Achievement of increased cardiorespiratory fitness and enhanced flexibility, muscular endurance and strength shown through measurements of functional capacity and personal statement of participant.   Increase Strength and Stamina Yes   Intervention Provide advice, education, support and counseling about physical activity/exercise needs.;Develop an individualized exercise prescription for aerobic and resistive training based on initial evaluation findings, risk  stratification, comorbidities and participant's personal goals.   Expected Outcomes Achievement of increased cardiorespiratory fitness and enhanced flexibility, muscular endurance and strength shown through measurements of functional capacity and personal statement of participant.   Improve shortness of breath with ADL's Yes   Intervention Provide education, individualized exercise plan and daily activity instruction to help decrease symptoms of SOB with activities of daily living.   Expected Outcomes Short Term: Achieves a reduction of symptoms when performing activities of daily living.   Develop more efficient breathing techniques such as purse lipped breathing and diaphragmatic breathing; and practicing self-pacing with activity Yes   Intervention Provide education, demonstration and support about specific breathing techniuqes utilized  for more efficient breathing. Include techniques such as pursed lipped breathing, diaphragmatic breathing and self-pacing activity.   Expected Outcomes Short Term: Participant will be able to demonstrate and use breathing techniques as needed throughout daily activities.   Increase knowledge of respiratory medications and ability to use respiratory devices properly  Yes  Breo, Duoneb, ProAir; spacer given   Intervention Provide education and demonstration as needed of appropriate use of medications, inhalers, and oxygen therapy.   Expected Outcomes Short Term: Achieves understanding of medications use. Understands that oxygen is a medication prescribed by physician. Demonstrates appropriate use of inhaler and oxygen therapy.   Diabetes Yes   Intervention Provide education about signs/symptoms and action to take for hypo/hyperglycemia.;Provide education about proper nutrition, including hydration, and aerobic/resistive exercise prescription along with prescribed medications to achieve blood glucose in normal ranges: Fasting glucose 65-99 mg/dL   Expected Outcomes  Short Term: Participant verbalizes understanding of the signs/symptoms and immediate care of hyper/hypoglycemia, proper foot care and importance of medication, aerobic/resistive exercise and nutrition plan for blood glucose control.;Long Term: Attainment of HbA1C < 7%.   Hypertension Yes   Intervention Provide education on lifestyle modifcations including regular physical activity/exercise, weight management, moderate sodium restriction and increased consumption of fresh fruit, vegetables, and low fat dairy, alcohol moderation, and smoking cessation.;Monitor prescription use compliance.   Expected Outcomes Short Term: Continued assessment and intervention until BP is < 140/91mm HG in hypertensive participants. < 130/54mm HG in hypertensive participants with diabetes, heart failure or chronic kidney disease.;Long Term: Maintenance of blood pressure at goal levels.   Lipids Yes   Intervention Provide education and support for participant on nutrition & aerobic/resistive exercise along with prescribed medications to achieve LDL 70mg , HDL >40mg .   Expected Outcomes Short Term: Participant states understanding of desired cholesterol values and is compliant with medications prescribed. Participant is following exercise prescription and nutrition guidelines.;Long Term: Cholesterol controlled with medications as prescribed, with individualized exercise RX and with personalized nutrition plan. Value goals: LDL < 70mg , HDL > 40 mg.      Core Components/Risk Factors/Patient Goals Review:    Core Components/Risk Factors/Patient Goals at Discharge (Final Review):    ITP Comments:   Comments:Ms Walker plans to start [facility] on 10/21/16 and attend 3 days/week.

## 2016-10-15 NOTE — Telephone Encounter (Signed)
I am unable to to view these results, when I click on the test in lab results, it does not show anything. Please obtain results, thanks.

## 2016-10-16 LAB — GLUCOSE, CAPILLARY
Glucose-Capillary: 102 mg/dL — ABNORMAL HIGH (ref 65–99)
Glucose-Capillary: 115 mg/dL — ABNORMAL HIGH (ref 65–99)

## 2016-10-16 NOTE — Telephone Encounter (Signed)
[facility] and spoke with [place]. States he is faxing over the results and will place in DR's folder.    Results in your folder.

## 2016-10-17 ENCOUNTER — Encounter: Payer: Self-pay | Admitting: Surgery

## 2016-10-17 ENCOUNTER — Ambulatory Visit (INDEPENDENT_AMBULATORY_CARE_PROVIDER_SITE_OTHER): Payer: Medicare Other | Admitting: Surgery

## 2016-10-17 VITALS — BP 142/92 | HR 96 | Temp 98.1°F | Resp 14 | Ht 66.0 in | Wt 196.6 lb

## 2016-10-17 DIAGNOSIS — R1033 Periumbilical pain: Secondary | ICD-10-CM | POA: Diagnosis not present

## 2016-10-17 DIAGNOSIS — K439 Ventral hernia without obstruction or gangrene: Secondary | ICD-10-CM

## 2016-10-17 NOTE — Patient Instructions (Addendum)
Please have your CT Scan done on Thursday 10/24/2016 at 10:45 AM. Please do not eat or drink four hours before your scan. Please pick up your Prep-Kit today at the [facility]. They will explain how and when to take it. We will call you with your results.  Please go to your Pulmonologist tomorrow for him to give you clearance for your EGD and Colonoscopy.  We will also need to obtain cardiac clearance from your Cardiologist.  Once you have your EGD and Colonoscopy scheduled or done, we will see you back to discuss surgery.

## 2016-10-17 NOTE — Progress Notes (Signed)
10/17/2016  Reason for Visit:  Ventral hernia  History of Present Illness: Amanda [name] is a 51 y.o. female who presents for evaluation of a ventral hernia.  The patient reports she's had the hernia for about a year and more recently has been causing more pain discomfort.  It is in the supraumbilical region.  The hernia is reducible and she is able to push on it to help when it bulges out.  Reports nausea and vomiting and was in the process of getting worked up with EGD and colonoscopy, but developed a pneumonia.  She is due to see her pulmonologist tomorrow for clearance for EGD and colonoscopy.   She has had a c-section, hysterectomy and tubal ligation, which required laparoscopic converted to open procedure due to extensive adhesions.  She reports she's had a hernia repair in the past as well as two surgeries for lysis of adhesions.  She has daily bowel movements and denies any blood in the stool.   Past Medical History: Past Medical History:  Diagnosis Date  . Arthritis    knees, shoulder, upper back  . Asthma   . Diabetes mellitus without complication (HCC)   . Fibromyalgia   . GERD (gastroesophageal reflux disease)   . Headache    migraines - 5x/mo  . Hypertension    no meds currently  . Migraines   . Motion sickness    ships  . Thyroid nodule    bilateral and goiter  . Wears contact lenses   . Wears dentures    partial upper Tachycardia Pneumonia Ventral hernia IBS with constipation     Past Surgical History: Past Surgical History:  Procedure Laterality Date  . ABDOMINAL HYSTERECTOMY  2000's  . ABDOMINAL SURGERY     laparoscopy x4 with lysis of adhesions  . APPENDECTOMY  1986  . CESAREAN SECTION  1992  . ECTOPIC PREGNANCY SURGERY  2000's  . HERNIA REPAIR    . JOINT REPLACEMENT Right 12/16/12   knee- medial - makoplasty  . KNEE ARTHROSCOPY Right 2006   partial medial and lateral meniscectomies  . KNEE ARTHROSCOPY Right 10/06/2015   Procedure: RIGHT KNEE  ARTHROSCOPY WITH DEBRIDEMENT;  Surgeon: Erin SonsHarold Kernodle, MD;  [facility];  Service: Orthopedics;  Laterality: Right;  Diabetic - insulin and oral meds  . ROTATOR CUFF REPAIR Left 2006  . TUBAL LIGATION  2000's    Home Medications: Prior to Admission medications   Medication Sig Start Date End Date Taking? Authorizing Provider  acetaminophen (TYLENOL) 325 MG tablet Take 650 mg by mouth 1 day or 1 dose. 09/29/16   Historical Provider, MD  albuterol (PROVENTIL HFA) 108 (90 Base) MCG/ACT inhaler Inhale 2 puffs into the lungs every 4 (four) hours as needed for wheezing or shortness of breath. 06/05/16   Shane CrutchPradeep Ramachandran, MD  ALPRAZolam Prudy Feeler(XANAX) 1 MG tablet Take 1 mg by mouth at bedtime as needed for anxiety or sleep.    Historical Provider, MD  ARIPiprazole (ABILIFY) 10 MG tablet Take 10 mg by mouth daily as needed.    Historical Provider, MD  azithromycin (ZITHROMAX) 500 MG tablet Take 1 tablet (500 mg total) by mouth daily. Take 1 tablet daily for 3 days. 09/27/16   Joni Reiningonald K Smith, PA-C  BIOTIN PO Take by mouth daily.    Historical Provider, MD  brompheniramine-pseudoephedrine-DM 30-2-10 MG/5ML syrup Take 5 mLs by mouth 4 (four) times daily as needed. 09/27/16   Joni Reiningonald K Smith, PA-C  canagliflozin (INVOKANA) 300 MG TABS tablet  Take 300 mg by mouth daily before breakfast.    Historical Provider, MD  cycloSPORINE (RESTASIS) 0.05 % ophthalmic emulsion 1 drop 2 (two) times daily.    Historical Provider, MD  dexmethylphenidate (FOCALIN) 10 MG tablet Take 10 mg by mouth 2 (two) times daily. 08/16/16   Historical Provider, MD  Diclofenac Sodium 3 % GEL Place 1 application onto the skin every 12 (twelve) hours as needed. 10/08/16   Myrna Blazer, MD  diltiazem (CARDIZEM CD) 180 MG 24 hr capsule Take 180 mg by mouth daily.    Historical Provider, MD  diltiazem (DILACOR XR) 240 MG 24 hr capsule  04/16/16   Historical Provider, MD  DULoxetine (CYMBALTA) 60 MG capsule Take 60 mg by  mouth daily. AM    Historical Provider, MD  flunisolide (NASALIDE) 25 MCG/ACT (0.025%) SOLN Place 2 sprays into the nose daily as needed.    Historical Provider, MD  fluticasone furoate-vilanterol (BREO ELLIPTA) 200-25 MCG/INH AEPB Inhale 1 puff into the lungs daily. 06/05/16   Shane Crutch, MD  ibuprofen (ADVIL,MOTRIN) 600 MG tablet Take 1 tablet (600 mg total) by mouth every 8 (eight) hours as needed. 09/27/16   Joni Reining, PA-C  insulin detemir (LEVEMIR) 100 UNIT/ML injection Inject 10 Units into the skin at bedtime.    Historical Provider, MD  ipratropium-albuterol (DUONEB) 0.5-2.5 (3) MG/3ML SOLN Take 3 mLs by nebulization every 4 (four) hours as needed.    Historical Provider, MD  isosorbide mononitrate (IMDUR) 30 MG 24 hr tablet Take 30 mg by mouth daily.    Historical Provider, MD  ketorolac (TORADOL) 10 MG tablet Take 1 tablet (10 mg total) by mouth every 8 (eight) hours as needed for moderate pain (with food). 08/15/16   Anne-Caroline Sharma Covert, MD  metFORMIN (GLUCOPHAGE) 500 MG tablet Take 1,000 mg by mouth 2 (two) times daily with a meal.     Historical Provider, MD  montelukast (SINGULAIR) 10 MG tablet Take 1 tablet (10 mg total) by mouth daily. 06/05/16 06/05/17  Shane Crutch, MD  omeprazole (PRILOSEC) 40 MG capsule Take 40 mg by mouth daily. 1 hr before supper    Historical Provider, MD  pregabalin (LYRICA) 200 MG capsule Take 200 mg by mouth 2 (two) times daily.    Historical Provider, MD  SUMAtriptan (IMITREX) 25 MG tablet Take 25 mg by mouth every 2 (two) hours as needed for migraine. May repeat in 2 hours if headache persists or recurs.    Historical Provider, MD  tiZANidine (ZANAFLEX) 4 MG tablet Take 4 mg by mouth every 6 (six) hours as needed for muscle spasms.    Historical Provider, MD    Allergies: Allergies  Allergen Reactions  . Penicillins Anaphylaxis    Has patient had a PCN reaction causing immediate rash, facial/tongue/throat swelling, SOB or  lightheadedness with hypotension: Yes Has patient had a PCN reaction causing severe rash involving mucus membranes or skin necrosis: No Has patient had a PCN reaction that required hospitalization No Has patient had a PCN reaction occurring within the last 10 years: No If all of the above answers are "NO", then may proceed with Cephalosporin use.   . Ace Inhibitors Swelling    Social History:  reports that she has never smoked. She has never used smokeless tobacco. She reports that she does not drink alcohol or use drugs.   Family History: Family History  Problem Relation Age of Onset  . Hypertension Mother   . Hypertension Father   . Diabetes Father   .  Allergies Father     Review of Systems: Review of Systems  Constitutional: Negative for chills and fever.  HENT: Negative for hearing loss.   Eyes: Negative for blurred vision.  Respiratory: Negative for cough and shortness of breath.   Cardiovascular: Negative for chest pain and leg swelling.  Gastrointestinal: Positive for abdominal pain, nausea and vomiting. Negative for blood in stool and heartburn.  Genitourinary: Negative for dysuria and hematuria.  Musculoskeletal: Positive for myalgias.  Skin: Negative for rash.  Neurological: Negative for dizziness.  Psychiatric/Behavioral: Negative for depression.  All other systems reviewed and are negative.   Physical Exam BP (!) 142/92   Pulse 96   Temp 98.1 F (36.7 C) (Oral)   Resp 14   Ht 5\' 6"  (1.676 m)   Wt 89.2 kg (196 lb 9.6 oz)   SpO2 97% Comment: Room Air  BMI 31.73 kg/m  CONSTITUTIONAL: No acute distress HEENT:  Normocephalic, atraumatic, extraocular motion intact. NECK: Trachea is midline, and there is no jugular venous distension.  RESPIRATORY:  Lungs are clear, and breath sounds are equal bilaterally. Normal respiratory effort without pathologic use of accessory muscles. CARDIOVASCULAR: Heart is regular without murmurs, gallops, or rubs. GI: The abdomen  is soft, nondistened, with tenderness to palpation over the supraumbilical area where her hernia is.  She has a reducible ventral hernia.  Lower abdominal scars from prior surgeries. There were no palpable masses.  MUSCULOSKELETAL:  Normal muscle strength and tone in all four extremities.  No peripheral edema or cyanosis. SKIN: Skin turgor is normal. There are no pathologic skin lesions.  NEUROLOGIC:  Motor and sensation is grossly normal.  Cranial nerves are grossly intact. PSYCH:  Alert and oriented to person, place and time. Affect is normal.  Laboratory Analysis: No results found for this or any previous visit (from the past 24 hour(s)).  Imaging: No results found.  Assessment and Plan: This is a 51 y.o. female who presents with a 1 year history of ventral hernia which has been causing more pain symptoms recently.  --Discussed with the patient the need to better evaluate her ventral hernia and scarring for surgical planning with a CT scan of abdomen/pelvis.  This may determine whether to attempt the hernia repair in a laparoscopic versus open fashion.  Given her multiple surgeries, she may need an open hernia repair. --Given her medical comorbidities, would want the patient to have clearance from her cardiologist and pulmonologist for surgery.  She should also continue with her workup with EGD and colonoscopy to assess better the source of her pain.  Her pain seems to be more diffuse than that expected from her ventral hernia. --She will follow up in a month after these studies are done to discuss surgical planning. --She has been advised of the different signs and symptoms of incarcerated ventral hernia and to come to hospital if any worsening pain, bulging that is not reducible, fevers, chills, or other concerns.   Howie Ill, MD [facility]

## 2016-10-18 ENCOUNTER — Ambulatory Visit (INDEPENDENT_AMBULATORY_CARE_PROVIDER_SITE_OTHER): Payer: Medicare Other | Admitting: Pulmonary Disease

## 2016-10-18 ENCOUNTER — Telehealth: Payer: Self-pay

## 2016-10-18 ENCOUNTER — Encounter: Payer: Self-pay | Admitting: Pulmonary Disease

## 2016-10-18 VITALS — BP 126/76 | HR 84 | Wt 198.0 lb

## 2016-10-18 DIAGNOSIS — Z01818 Encounter for other preprocedural examination: Secondary | ICD-10-CM | POA: Diagnosis not present

## 2016-10-18 DIAGNOSIS — J4530 Mild persistent asthma, uncomplicated: Secondary | ICD-10-CM

## 2016-10-18 NOTE — Telephone Encounter (Signed)
Cardiac Clearance faxed to Dr.shaukat Welton FlakesKhan at this time.

## 2016-10-18 NOTE — Telephone Encounter (Signed)
Pulmonary Clearance faxed to DR.Ramachandran at this time.  Cardiac Clearance faxed to DR.Freda MunroSaadat Khan at this time.

## 2016-10-18 NOTE — Patient Instructions (Signed)
Continue Breo and Singulair  Continue albuterol (Pro-air) inhaler as needed  You may undergo the endoscopy and surgery procedures as planned  If you are hospitalized after surgery and have any breathing or pulmonary problems, make sure that our service is notified  Follow up in 3 months with Dr Nicholos Johnsamachandran

## 2016-10-20 NOTE — Progress Notes (Signed)
PULMONARY OFFICE FOLLOW UP NOTE  PROBLEMS:  Chronic asthma  DATA: CT chest 10/08/16: No acute pulmonary processes  INTERVAL HISTORY: Planned EGD, colonoscopy and ventral hernia repair  SUBJ: Asked to perform pre-op pulmonary eval for above procedures. States that her asthma is "wel-controlled". She is using albuterol inhaler 0-2 times per day. Denies CP, fever, purulent sputum, hemoptysis, LE edema and calf tenderness.   OBJ: Vitals:   10/18/16 1017  BP: 126/76  Pulse: 84  SpO2: 97%  Weight: 198 lb (89.8 kg)    Gen: WDWN in NAD HEENT: WNL Neck: NO LAN, no JVD noted Lungs: full BS, normal percussion note, no wheezes Cardiovascular: Reg rate, normal rhythm, no M noted Abdomen: Obese, soft, NT +BS Ext: no C/C/E Neuro: CNs intact, motor/sens grossly intact Skin: No lesions noted    DATA: Recent CT chest reviewed as noted above  IMPRESSION: Chronic asthma - likely mild tomoderate persistent. Well controlled on Breo inhaler and montelukast.   Pre-op pulmonary eval - She is likely at mildly increased risk of post op pulmonary complications due to asthma and obesity. Her asthma is well controlled presently and there are no further measures that can be undertaken to mitigate risk any further.  PLAN: Cont Breo and montelukast - these medications should be continued pre- and peri-operatively. If she is hospitalized post operatively, suggest nebulized bronchodilators post operatively. PCCM service would be happy to participate in her care   Follow up in 3 months with Dr Myrle Shengamachandran   Keiana Tavella, MD PCCM service Mobile [phone] Pager [phone] 10/20/2016

## 2016-10-21 DIAGNOSIS — G4730 Sleep apnea, unspecified: Secondary | ICD-10-CM

## 2016-10-21 LAB — GLUCOSE, CAPILLARY
Glucose-Capillary: 113 mg/dL — ABNORMAL HIGH (ref 65–99)
Glucose-Capillary: 117 mg/dL — ABNORMAL HIGH (ref 65–99)

## 2016-10-21 NOTE — Progress Notes (Signed)
Daily Session Note  Patient Details  Name: Amanda [name] MRN: [id number] Date of Birth: 01/08/1966 Referring Provider:   Flowsheet Row Pulmonary Rehab from 10/15/2016 in [facility] Cardiac and Pulmonary Rehab  Referring Provider  Aundra Dubin      Encounter Date: 10/21/2016  Check In:     Session Check In - 10/21/16 1255      Check-In   Location [facility]-Cardiac & Pulmonary Rehab   Staff Present Earlean Shawl, BS, ACSM CEP, Exercise Physiologist;Laureen Owens Shark, BS, RRT, Respiratory Dareen Piano, BA, ACSM CEP, Exercise Physiologist   Supervising physician immediately available to respond to emergencies [facility] immediately available ER MD   Physician(s) Alfred Levins and Corky Downs   Medication changes reported     No   Fall or balance concerns reported    No   Warm-up and Cool-down Performed as group-led Location manager Performed Yes   VAD Patient? No     Pain Assessment   Currently in Pain? No/denies         Goals Met:  Proper associated with RPD/PD & O2 Sat Independence with exercise equipment Exercise tolerated well Strength training completed today  Goals Unmet:  Not Applicable  Comments: First full day of exercise!  Patient was oriented to gym and equipment including functions, settings, policies, and procedures.  Patient's individual exercise prescription and treatment plan were reviewed.  All starting workloads were established based on the results of the 6 minute walk test done at initial orientation visit.  The plan for exercise progression was also introduced and progression will be customized based on patient's performance and goals.    Dr. Emily Filbert is Medical Director for [place] and [facility].

## 2016-10-22 ENCOUNTER — Telehealth: Payer: Self-pay

## 2016-10-22 NOTE — Telephone Encounter (Signed)
Pulmonary Clearance faxed to Dr.David Simonds at this time.

## 2016-10-23 ENCOUNTER — Telehealth: Payer: Self-pay

## 2016-10-23 ENCOUNTER — Encounter: Payer: Medicare Other | Admitting: *Deleted

## 2016-10-23 DIAGNOSIS — G4730 Sleep apnea, unspecified: Secondary | ICD-10-CM

## 2016-10-23 LAB — GLUCOSE, CAPILLARY
Glucose-Capillary: 108 mg/dL — ABNORMAL HIGH (ref 65–99)
Glucose-Capillary: 106 mg/dL — ABNORMAL HIGH (ref 65–99)

## 2016-10-23 NOTE — Progress Notes (Signed)
Daily Session Note  Patient Details  Name: MAHLIA FERNANDO MRN: [id number] Date of Birth: Mar 10, 1966 Referring Provider:   Flowsheet Row Pulmonary Rehab from 10/15/2016 in [facility] Cardiac and Pulmonary Rehab  Referring Provider  Aundra Dubin      Encounter Date: 10/23/2016  Check In:     Session Check In - 10/23/16 1137      Check-In   Location [facility]-Cardiac & Pulmonary Rehab   Staff Present Alberteen Sam, MA, ACSM RCEP, Exercise Physiologist;Carroll Enterkin, RN, BSN;Laureen Owens Shark, BS, RRT, Respiratory Therapist   Supervising physician immediately available to respond to emergencies [facility] immediately available ER MD   Physician(s) Drs. Quentin Cornwall and [place]   Medication changes reported     No   Fall or balance concerns reported    No   Warm-up and Cool-down Performed as group-led Location manager Performed Yes   VAD Patient? No     Pain Assessment   Currently in Pain? No/denies   Multiple Pain Sites No         Goals Met:  Proper associated with RPD/PD & O2 Sat Independence with exercise equipment Using PLB without cueing & demonstrates good technique Exercise tolerated well Strength training completed today  Goals Unmet:  Not Applicable  Comments: Pt able to follow exercise prescription today without complaint.  Will continue to monitor for progression.    Dr. Emily Filbert is Medical Director for [place] and [facility].

## 2016-10-23 NOTE — Telephone Encounter (Signed)
Pulmonary Clearance received from DR. Billy Fischeravid Simonds at this time. Per Dr.Simonds- You may undergo the endoscopy and surgery procedures as planned. Please see last office visit note from 10/18/16.

## 2016-10-24 ENCOUNTER — Ambulatory Visit
Admission: RE | Admit: 2016-10-24 | Discharge: 2016-10-24 | Disposition: A | Discharge status: Home / Self Care | Payer: Medicare Other | Source: Ambulatory Visit | Attending: Surgery | Admitting: Surgery

## 2016-10-24 ENCOUNTER — Telehealth: Payer: Self-pay

## 2016-10-24 DIAGNOSIS — R1033 Periumbilical pain: Secondary | ICD-10-CM | POA: Diagnosis not present

## 2016-10-24 DIAGNOSIS — K7689 Other specified diseases of liver: Secondary | ICD-10-CM | POA: Diagnosis not present

## 2016-10-24 MED ORDER — IOPAMIDOL (ISOVUE-300) INJECTION 61%
100.0000 mL | Freq: Once | INTRAVENOUS | Status: AC | PRN
  Administered 2016-10-24: 100 mL via INTRAVENOUS

## 2016-10-24 NOTE — Telephone Encounter (Signed)
Cardiac Clearance obtained from Dr.Shaukat Welton FlakesKhan at this time and scanned under Media.

## 2016-10-28 ENCOUNTER — Encounter: Payer: Self-pay | Admitting: Surgery

## 2016-10-28 DIAGNOSIS — G4730 Sleep apnea, unspecified: Secondary | ICD-10-CM

## 2016-10-28 NOTE — Progress Notes (Signed)
Daily Session Note  Patient Details  Name: Amanda [name] MRN: [id number] Date of Birth: 1966-06-17 Referring Provider:   Flowsheet Row Pulmonary Rehab from 10/15/2016 in [facility] Card[facility] Pulmonary Rehab  Referring Provider  Aundra Dubin      Encounter Date: 10/28/2016  Check In:     Session Check In - 10/28/16 1520      Check-In   Location [facility]-Cardiac & Pulmonary Rehab   Staff Present Carson Myrtle, BS, RRT, Respiratory Therapist;Kelly Amedeo Plenty, BS, ACSM CEP, Exercise Physiologist;Yuriy Cui Oletta Darter, BA, ACSM CEP, Exercise Physiologist   Supervising physician immediately available to respond to emergencies [facility] immediately available ER MD   Physician(s) Jimmye Norman and Quentin Cornwall   Medication changes reported     No   Fall or balance concerns reported    No   Warm-up and Cool-down Performed as group-led instruction   Resistance Training Performed Yes   VAD Patient? No         History  Smoking Status  . Never Smoker  Smokeless Tobacco  . Never Used    Goals Met:  Proper associated with RPD/PD & O2 Sat Independence with exercise equipment Exercise tolerated well Strength training completed today  Goals Unmet:  Not Applicable  Comments: Pt able to follow exercise prescription today without complaint.  Will continue to monitor for progression.    Dr. Emily Filbert is Medical Director for [place] and [facility].

## 2016-11-01 ENCOUNTER — Encounter: Payer: Medicare Other | Attending: Internal Medicine | Admitting: *Deleted

## 2016-11-01 DIAGNOSIS — G4730 Sleep apnea, unspecified: Secondary | ICD-10-CM | POA: Diagnosis not present

## 2016-11-01 LAB — GLUCOSE, CAPILLARY: Glucose-Capillary: 134 mg/dL — ABNORMAL HIGH (ref 65–99)

## 2016-11-01 NOTE — Progress Notes (Signed)
Daily Session Note  Patient Details  Name: Amanda [name] MRN: [id number] Date of Birth: 03-May-1966 Referring Provider:   Flowsheet Row Pulmonary Rehab from 10/15/2016 in [facility] Cardiac and Pulmonary Rehab  Referring Provider  Aundra Dubin      Encounter Date: 11/01/2016  Check In:     Session Check In - 11/01/16 1235      Check-In   Location [facility]-Cardiac & Pulmonary Rehab   Staff Present Alberteen Sam, MA, ACSM RCEP, Exercise Physiologist;Patricia Surles RN BSN   Supervising physician immediately available to respond to emergencies [facility] immediately available ER MD   Physician(s) Dr. Reita Cliche and Clearnce Hasten   Medication changes reported     No   Fall or balance concerns reported    No   Warm-up and Cool-down Performed on first and last piece of equipment   Resistance Training Performed No  arrived late   VAD Patient? No     Pain Assessment   Currently in Pain? No/denies   Multiple Pain Sites No         History  Smoking Status  . Never Smoker  Smokeless Tobacco  . Never Used    Goals Met:  Proper associated with RPD/PD & O2 Sat Independence with exercise equipment Using PLB without cueing & demonstrates good technique Exercise tolerated well Strength training completed today  Goals Unmet:  Not Applicable  Comments: Pt able to follow exercise prescription today without complaint.  Will continue to monitor for progression.    Dr. Emily Filbert is Medical Director for [name] and [facility].

## 2016-11-05 ENCOUNTER — Telehealth: Payer: Self-pay

## 2016-11-05 NOTE — Telephone Encounter (Signed)
I have been trying to get in contact with patient and have not been able to. Therefore, I called patient's mother (Mrs. Thurmond ButtsWade) and asked if she had a different phone number for her daughter since she is not currently accepting calls. Patient's mother stated that she had been trying to call her as well and it would tell her the same thing. Patient's mother stated that she would go to her house again and check up on her. I told Mrs. Thurmond ButtsWade to let her daughter know that we have been trying to get in contact with. She stated that she would. In addition, I told Mrs. Thurmond ButtsWade that I would send her daughter a letter by mail letting her that we need to speak with her.  Patient needs to schedule her EGD and Colonoscopy with her Dr. Markham JordanElliot and once we have results, then we will schedule a follow up appointment with Dr. Aleen CampiPiscoya.

## 2016-11-06 ENCOUNTER — Telehealth: Payer: Self-pay | Admitting: Emergency Medicine

## 2016-11-06 ENCOUNTER — Telehealth: Payer: Self-pay | Admitting: Respiratory Therapy

## 2016-11-06 NOTE — Telephone Encounter (Signed)
Called Amanda [name] - last attended 11/01/16. I was not able to leave a message.

## 2016-11-06 NOTE — Telephone Encounter (Signed)
Encarnacion called today to inform us that she has an appointment today and cannot come to class today.

## 2016-11-07 ENCOUNTER — Ambulatory Visit: Payer: Medicare Other

## 2016-11-07 ENCOUNTER — Telehealth: Payer: Self-pay

## 2016-11-07 NOTE — Telephone Encounter (Signed)
Pulmonary Clearance obtained from DR.Billy Fischeravid Simonds at this time and will be scanned under Media.

## 2016-11-11 ENCOUNTER — Encounter: Payer: Self-pay | Admitting: *Deleted

## 2016-11-11 DIAGNOSIS — G4730 Sleep apnea, unspecified: Secondary | ICD-10-CM

## 2016-11-11 NOTE — Progress Notes (Signed)
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 balance of [phone] kcal/d;Understanding recommendations for meals to include 15-35% energy as protein, 25-35% energy from fat, 35-60% energy from carbohydrates, less than 2090mof dietary cholesterol, 20-35 gm of total fiber daily;Understanding of distribution of calorie intake throughout the day with the consumption of 4-5 meals/snacks   Sedentary Yes   Intervention Provide advice, education, support and counseling about physical activity/exercise needs.;Develop an individualized exercise prescription for aerobic and resistive training based on initial evaluation findings, risk stratification, comorbidities and participant's personal goals.   Expected Outcomes Achievement of increased cardiorespiratory fitness and enhanced flexibility,  muscular endurance and strength shown through measurements of functional capacity and personal statement of participant.   Increase Strength and Stamina Yes   Intervention Provide advice, education, support and counseling about physical activity/exercise needs.;Develop an individualized exercise prescription for aerobic and resistive training based on initial evaluation findings, risk stratification, comorbidities and participant's personal goals.   Expected Outcomes Achievement of increased cardiorespiratory fitness and enhanced flexibility, muscular endurance  and strength shown through measurements of functional capacity and personal statement of participant.   Improve shortness of breath with ADL's Yes   Intervention Provide education, individualized exercise plan and daily activity instruction to help decrease symptoms of SOB with activities of daily living.   Expected Outcomes Short Term: Achieves a reduction of symptoms when performing activities of daily living.   Develop more efficient breathing techniques such as purse lipped breathing and diaphragmatic breathing; and practicing self-pacing with activity Yes   Intervention Provide education, demonstration and support about specific breathing techniuqes utilized for more efficient breathing. Include techniques such as pursed lipped breathing, diaphragmatic breathing and self-pacing activity.   Expected Outcomes Short Term: Participant will be able to demonstrate and use breathing techniques as needed throughout daily activities.   Increase knowledge of respiratory medications and ability to use respiratory devices properly  Yes  Breo, Duoneb, ProAir; spacer given   Intervention Provide education and demonstration as needed of appropriate use of medications, inhalers, and oxygen therapy.   Expected Outcomes Short Term: Achieves understanding of medications use. Understands that oxygen is a medication prescribed by physician. Demonstrates  appropriate use of inhaler and oxygen therapy.   Diabetes Yes   Intervention Provide education about signs/symptoms and action to take for hypo/hyperglycemia.;Provide education about proper nutrition, including hydration, and aerobic/resistive exercise prescription along with prescribed medications to achieve blood glucose in normal ranges: Fasting glucose 65-99 mg/dL   Expected Outcomes Short Term: Participant verbalizes understanding of the signs/symptoms and immediate care of hyper/hypoglycemia, proper foot care and importance of medication, aerobic/resistive exercise and nutrition plan for blood glucose control.;Long Term: Attainment of HbA1C < 7%.   Hypertension Yes   Intervention Provide education on lifestyle modifcations including regular physical activity/exercise, weight management, moderate sodium restriction and increased consumption of fresh fruit, vegetables, and low fat dairy, alcohol moderation, and smoking cessation.;Monitor prescription use compliance.   Expected Outcomes Short Term: Continued assessment and intervention until BP is < 140/16m HG in hypertensive participants. < 130/825mHG in hypertensive participants with diabetes, heart failure or chronic kidney disease.;Long Term: Maintenance of blood pressure at goal levels.   Lipids Yes   Intervention Provide education and support for participant on nutrition & aerobic/resistive exercise along with prescribed medications to achieve LDL <7076mHDL >74m96m Expected Outcomes Short Term: Participant states understanding of desired cholesterol values and is compliant with medications prescribed. Participant is following exercise prescription and nutrition guidelines.;Long Term: Cholesterol controlled with medications as prescribed, with individualized exercise RX and with personalized nutrition plan. Value goals: LDL < 70mg10mL > 40 mg.      Core Components/Risk Factors/Patient Goals Review:    Core Components/Risk Factors/Patient  Goals at Discharge (Final Review):    ITP Comments:   Comments: 30 Day Review

## 2016-11-12 ENCOUNTER — Encounter: Payer: Self-pay | Admitting: *Deleted

## 2016-11-12 DIAGNOSIS — G4730 Sleep apnea, unspecified: Secondary | ICD-10-CM

## 2016-11-20 ENCOUNTER — Ambulatory Visit: Payer: Self-pay | Admitting: Surgery

## 2016-11-26 ENCOUNTER — Encounter: Payer: Self-pay | Admitting: Respiratory Therapy

## 2016-11-26 ENCOUNTER — Telehealth: Payer: Self-pay | Admitting: Respiratory Therapy

## 2016-11-26 DIAGNOSIS — G4730 Sleep apnea, unspecified: Secondary | ICD-10-CM

## 2016-11-26 NOTE — Telephone Encounter (Signed)
Called Amanda [name]  - last attended 11/01/16. Amanda [name] has had problems with bronchitis for a few weeks. She is also starting work with daytime hours and will not be able to attend [facility]. A better option for her and her daughter, [name], who is also in [facility] is the [facility] where they can still be supervised and have oximetry and blood pressures available and can attend in the early evenings. [facility] supervisor will contact them this week.

## 2016-11-26 NOTE — Progress Notes (Signed)
Discharge Summary  Patient Details  Name: Amanda [name] MRN: [id number] Date of Birth: November 08, 1965 Referring Provider:     Pulmonary Rehab from 10/15/2016 in [facility]  Referring Provider  Shirlee Latch       Number of Visits: 5 Reason for Discharge:  Early Exit:  Back to work  Smoking History:  History  Smoking Status   Never Smoker  Smokeless Tobacco   Never Used    Diagnosis:  Sleep apnea, unspecified type  ADL UCSD:     Pulmonary Assessment Scores    Row Name 10/15/16 1623         ADL UCSD   ADL Phase Entry     SOB Score total 103     Rest 0     Walk 4     Stairs 5     Bath 3     Dress 3     Shop 5        Initial Exercise Prescription:     Initial Exercise Prescription - 10/15/16 1600      Date of Initial Exercise RX and Referring Provider   Date 10/15/16   Referring Provider Shirlee Latch     Treadmill   MPH 2.5   Grade 0   Minutes 15   METs 2.91     NuStep   Level 3   Minutes 15   METs 2.5     REL-XR   Level 3   Minutes 15   METs 2.5     Prescription Details   Frequency (times per week) 3   Duration Progress to 45 minutes of aerobic exercise without signs/symptoms of physical distress     Intensity   THRR 40-80% of Max Heartrate 118-152   Ratings of Perceived Exertion 11-15   Perceived Dyspnea 0-4     Progression   Progression Continue to progress workloads to maintain intensity without signs/symptoms of physical distress.     Resistance Training   Training Prescription Yes   Weight 3   Reps 10-15      Discharge Exercise Prescription (Final Exercise Prescription Changes):     Exercise Prescription Changes - 11/12/16 1600      Response to Exercise   Blood Pressure (Admit) 136/74   Blood Pressure (Exercise) 138/78   Blood Pressure (Exit) 142/80   Heart Rate (Admit) 70 bpm   Heart Rate (Exercise) 140 bpm   Heart Rate (Exit) 82 bpm   Oxygen Saturation (Admit) 99 %   Oxygen Saturation (Exercise) 94 %    Oxygen Saturation (Exit) 96 %   Rating of Perceived Exertion (Exercise) 13   Perceived Dyspnea (Exercise) 1   Symptoms none   Duration Continue with 45 min of aerobic exercise without signs/symptoms of physical distress.   Intensity THRR unchanged     Progression   Progression Continue to progress workloads to maintain intensity without signs/symptoms of physical distress.   Average METs 3.96     Resistance Training   Training Prescription Yes   Weight 3 lbs/green band   Reps 10-15     Interval Training   Interval Training No     Treadmill   MPH 2.5   Grade 0.5   Minutes 15   METs 3.09     NuStep   Level 5   SPM 81   Minutes 15   METs 3     REL-XR   Level 3   Watts 66   Minutes 15   METs 5.8  Functional Capacity:     6 Minute Walk    Row Name 10/15/16 1609         6 Minute Walk   Phase Initial     Distance 1035 feet     Walk Time 6 minutes     # of Rest Breaks 0     MPH 1.96     RPE 15     Perceived Dyspnea  3     VO2 Peak 10.65     Symptoms No     Resting HR 85 bpm     Resting BP 104/68     Max Ex. HR 102 bpm     Max Ex. BP 104/62       Interval HR   Baseline HR 85     1 Minute HR 88     2 Minute HR 84     3 Minute HR 100     4 Minute HR 98     5 Minute HR 102     6 Minute HR 99     2 Minute Post HR 82     Interval Heart Rate? Yes       Interval Oxygen   Interval Oxygen? Yes     Baseline Oxygen Saturation % 98 %     1 Minute Oxygen Saturation % 94 %     2 Minute Oxygen Saturation % 93 %     3 Minute Oxygen Saturation % 93 %     4 Minute Oxygen Saturation % 89 %     5 Minute Oxygen Saturation % 93 %     6 Minute Oxygen Saturation % 94 %        Psychological, QOL, Others - Outcomes: PHQ 2/9: Depression screen PHQ 2/9 10/15/2016  Decreased Interest 3  Down, Depressed, Hopeless 3  PHQ - 2 Score 6  Altered sleeping 3  Tired, decreased energy 3  Change in appetite 3  Feeling bad or failure about yourself  3  Trouble  concentrating 3  Moving slowly or fidgety/restless 3  PHQ-9 Score 24  Difficult doing work/chores Extremely dIfficult    Quality of Life:     Quality of Life - 10/15/16 1634      Quality of Life Scores   Health/Function Pre 21 %   Socioeconomic Pre 21 %   Psych/Spiritual Pre 21 %   Family Pre 21 %   GLOBAL Pre 21 %      Personal Goals: Goals established at orientation with interventions provided to work toward goal.     Personal Goals and Risk Factors at Admission - 10/15/16 1627      Core Components/Risk Factors/Patient Goals on Admission    Weight Management Yes;Weight Loss   Intervention Weight Management: Develop a combined nutrition and exercise program designed to reach desired caloric intake, while maintaining appropriate intake of nutrient and fiber, sodium and fats, and appropriate energy expenditure required for the weight goal.;Weight Management: Provide education and appropriate resources to help participant work on and attain dietary goals.   Admit Weight 195 lb 6.4 oz (88.6 kg)   Goal Weight: Short Term 190 lb (86.2 kg)   Goal Weight: Long Term 140 lb (63.5 kg)   Expected Outcomes Short Term: Continue to assess and modify interventions until short term weight is achieved;Long Term: Adherence to nutrition and physical activity/exercise program aimed toward attainment of established weight goal;Weight Maintenance: Understanding of the daily nutrition guidelines, which includes 25-35% calories from fat, 7%  or less cal from saturated fats, less than 200mg  cholesterol, less than 1.5gm of sodium, & 5 or more servings of fruits and vegetables daily;Weight Loss: Understanding of general recommendations for a balanced deficit meal plan, which promotes 1-2 lb weight loss per week and includes a negative energy balance of [phone] kcal/d;Understanding recommendations for meals to include 15-35% energy as protein, 25-35% energy from fat, 35-60% energy from carbohydrates, less than  200mg  of dietary cholesterol, 20-35 gm of total fiber daily;Understanding of distribution of calorie intake throughout the day with the consumption of 4-5 meals/snacks   Sedentary Yes   Intervention Provide advice, education, support and counseling about physical activity/exercise needs.;Develop an individualized exercise prescription for aerobic and resistive training based on initial evaluation findings, risk stratification, comorbidities and participant's personal goals.   Expected Outcomes Achievement of increased cardiorespiratory fitness and enhanced flexibility, muscular endurance and strength shown through measurements of functional capacity and personal statement of participant.   Increase Strength and Stamina Yes   Intervention Provide advice, education, support and counseling about physical activity/exercise needs.;Develop an individualized exercise prescription for aerobic and resistive training based on initial evaluation findings, risk stratification, comorbidities and participant's personal goals.   Expected Outcomes Achievement of increased cardiorespiratory fitness and enhanced flexibility, muscular endurance and strength shown through measurements of functional capacity and personal statement of participant.   Improve shortness of breath with ADL's Yes   Intervention Provide education, individualized exercise plan and daily activity instruction to help decrease symptoms of SOB with activities of daily living.   Expected Outcomes Short Term: Achieves a reduction of symptoms when performing activities of daily living.   Develop more efficient breathing techniques such as purse lipped breathing and diaphragmatic breathing; and practicing self-pacing with activity Yes   Intervention Provide education, demonstration and support about specific breathing techniuqes utilized for more efficient breathing. Include techniques such as pursed lipped breathing, diaphragmatic breathing and self-pacing  activity.   Expected Outcomes Short Term: Participant will be able to demonstrate and use breathing techniques as needed throughout daily activities.   Increase knowledge of respiratory medications and ability to use respiratory devices properly  Yes  Breo, Duoneb, ProAir; spacer given   Intervention Provide education and demonstration as needed of appropriate use of medications, inhalers, and oxygen therapy.   Expected Outcomes Short Term: Achieves understanding of medications use. Understands that oxygen is a medication prescribed by physician. Demonstrates appropriate use of inhaler and oxygen therapy.   Diabetes Yes   Intervention Provide education about signs/symptoms and action to take for hypo/hyperglycemia.;Provide education about proper nutrition, including hydration, and aerobic/resistive exercise prescription along with prescribed medications to achieve blood glucose in normal ranges: Fasting glucose 65-99 mg/dL   Expected Outcomes Short Term: Participant verbalizes understanding of the signs/symptoms and immediate care of hyper/hypoglycemia, proper foot care and importance of medication, aerobic/resistive exercise and nutrition plan for blood glucose control.;Long Term: Attainment of HbA1C < 7%.   Hypertension Yes   Intervention Provide education on lifestyle modifcations including regular physical activity/exercise, weight management, moderate sodium restriction and increased consumption of fresh fruit, vegetables, and low fat dairy, alcohol moderation, and smoking cessation.;Monitor prescription use compliance.   Expected Outcomes Short Term: Continued assessment and intervention until BP is < 140/78mm HG in hypertensive participants. < 130/70mm HG in hypertensive participants with diabetes, heart failure or chronic kidney disease.;Long Term: Maintenance of blood pressure at goal levels.   Lipids Yes   Intervention Provide education and support for participant on nutrition &  aerobic/resistive exercise  along with prescribed medications to achieve LDL 70mg , HDL >40mg .   Expected Outcomes Short Term: Participant states understanding of desired cholesterol values and is compliant with medications prescribed. Participant is following exercise prescription and nutrition guidelines.;Long Term: Cholesterol controlled with medications as prescribed, with individualized exercise RX and with personalized nutrition plan. Value goals: LDL < 70mg , HDL > 40 mg.       Personal Goals Discharge:   Nutrition & Weight - Outcomes:     Pre Biometrics - 10/15/16 1607      Pre Biometrics   Height 5\' 6"  (1.676 m)   Weight 195 lb 6.4 oz (88.6 kg)   Waist Circumference 37 inches   Hip Circumference 43.25 inches   Waist to Hip Ratio 0.86 %   BMI (Calculated) 31.6       Nutrition:   Nutrition Discharge:   Education Questionnaire Score:     Knowledge Questionnaire Score - 10/15/16 1621      Knowledge Questionnaire Score   Pre Score 10/10      Goals reviewed with patient; copy given to patient.

## 2016-11-26 NOTE — Progress Notes (Signed)
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 and management of anxiety.   Relaxation: - Provides group, verbal and written instruction about the benefits of relaxation for patients with heart/lung disease. Also provides patients with examples of relaxation techniques.   Knowledge Questionnaire Score:     Knowledge Questionnaire Score - 10/15/16 1621      Knowledge Questionnaire Score   Pre Score 10/10       Core Components/Risk Factors/Patient Goals at Admission:     Personal Goals and Risk Factors at Admission - 10/15/16 1627      Core Components/Risk Factors/Patient Goals on Admission    Weight Management Yes;Weight Loss   Intervention Weight Management: Develop a combined nutrition and exercise program designed to reach desired caloric intake, while maintaining appropriate intake of nutrient and fiber, sodium and fats, and appropriate energy expenditure required for the weight goal.;Weight  Management: Provide education and appropriate resources to help participant work on and attain dietary goals.   Admit Weight 195 lb 6.4 oz (88.6 kg)   Goal Weight: Short Term 190 lb (86.2 kg)   Goal Weight: Long Term 140 lb (63.5 kg)   Expected Outcomes Short Term: Continue to assess and modify interventions until short term weight is achieved;Long Term: Adherence  to nutrition and physical activity/exercise program aimed toward attainment of established weight goal;Weight Maintenance: Understanding of the daily nutrition guidelines, which includes 25-35% calories from fat, 7% or less cal from saturated fats, less than 253m cholesterol, less than 1.5gm of sodium, & 5 or more servings of fruits and vegetables daily;Weight Loss: Understanding of general recommendations for a balanced deficit meal plan, which promotes 1-2 lb weight loss per week and includes a negative energy balance of [phone] kcal/d;Understanding recommendations for meals to include 15-35% energy as protein, 25-35% energy from fat, 35-60% energy from carbohydrates, less than 2023mof dietary cholesterol, 20-35 gm of total fiber daily;Understanding of distribution of calorie intake throughout the day with the consumption of 4-5 meals/snacks   Sedentary Yes   Intervention Provide advice, education, support and counseling about physical activity/exercise needs.;Develop an individualized exercise prescription for aerobic and resistive training based on initial evaluation findings, risk stratification, comorbidities and participant's personal goals.   Expected Outcomes Achievement of increased cardiorespiratory fitness and enhanced flexibility, muscular endurance and strength shown through measurements of functional capacity and personal statement of participant.   Increase Strength and Stamina Yes   Intervention Provide advice, education, support and counseling about physical activity/exercise needs.;Develop an individualized exercise  prescription for aerobic and resistive training based on initial evaluation findings, risk stratification, comorbidities and participant's personal goals.   Expected Outcomes Achievement of increased cardiorespiratory fitness and enhanced flexibility, muscular endurance and strength shown through measurements of functional capacity and personal statement of participant.   Improve shortness of breath with ADL's Yes   Intervention Provide education, individualized exercise plan and daily activity instruction to help decrease symptoms of SOB with activities of daily living.   Expected Outcomes Short Term: Achieves a reduction of symptoms when performing activities of daily living.   Develop more efficient breathing techniques such as purse lipped breathing and diaphragmatic breathing; and practicing self-pacing with activity Yes   Intervention Provide education, demonstration and support about specific breathing techniuqes utilized for more efficient breathing. Include techniques such as pursed lipped breathing, diaphragmatic breathing and self-pacing activity.   Expected Outcomes Short Term: Participant will be able to demonstrate and use breathing techniques as needed throughout daily activities.   Increase knowledge of respiratory medications and ability to use respiratory devices properly  Yes  Breo, Duoneb, ProAir; spacer given   Intervention Provide education and demonstration as needed of appropriate use of medications, inhalers, and oxygen therapy.   Expected Outcomes Short Term: Achieves understanding of medications use. Understands that oxygen is a medication prescribed by physician. Demonstrates appropriate use of inhaler and oxygen therapy.   Diabetes Yes   Intervention Provide education about signs/symptoms and action to take for hypo/hyperglycemia.;Provide education about proper nutrition, including hydration, and aerobic/resistive exercise prescription along with prescribed medications to  achieve blood glucose in normal ranges: Fasting glucose 65-99 mg/dL   Expected Outcomes Short Term: Participant verbalizes understanding of the signs/symptoms and immediate care of hyper/hypoglycemia, proper foot care and importance of medication, aerobic/resistive exercise and nutrition plan for blood glucose control.;Long Term: Attainment of HbA1C < 7%.   Hypertension Yes   Intervention Provide education on lifestyle modifcations including regular physical activity/exercise, weight management, moderate sodium restriction and increased consumption of fresh fruit, vegetables, and low fat dairy, alcohol moderation, and smoking cessation.;Monitor prescription use compliance.   Expected Outcomes Short Term: Continued assessment and intervention until BP is < 140/9058mG in hypertensive participants. < 130/72m14m in hypertensive participants with diabetes, heart failure or chronic kidney disease.;Long  Term: Maintenance of blood pressure at goal levels.   Lipids Yes   Intervention Provide education and support for participant on nutrition & aerobic/resistive exercise along with prescribed medications to achieve LDL <12m, HDL >441m   Expected Outcomes Short Term: Participant states understanding of desired cholesterol values and is compliant with medications prescribed. Participant is following exercise prescription and nutrition guidelines.;Long Term: Cholesterol controlled with medications as prescribed, with individualized exercise RX and with personalized nutrition plan. Value goals: LDL < 7095mHDL > 40 mg.      Core Components/Risk Factors/Patient Goals Review:    Core Components/Risk Factors/Patient Goals at Discharge (Final Review):    ITP Comments:     ITP Comments    Row Name 11/26/16 1147           ITP Comments Called Ms WalGilford Rile last attended 11/01/16. Ms WalGilford Riles had problems with bronchitis for a few weeks. She is also starting work with daytime hours and will not be able to  attend [facility]. A better option for her and her daughter, LatArdeen Fillersho is also in [place] [facility] where they can still be supervised and have oximetry and blood pressures available and can attend in the early evenings. [facility] supervisor will contact them this week.          Comments: Called Ms WalGilford Rile last attended 11/01/16. Ms WalGilford Riles had problems with bronchitis for a few weeks. She is also starting work with daytime hours and will not be able to attend [facility]. A better option for her and her daughter, LatArdeen Fillersho is also in [place] [facility] where they can still be supervised and have oximetry and blood pressures available and can attend in the early evenings. [facility] supervisor will contact them this week.

## 2017-01-06 ENCOUNTER — Ambulatory Visit: Payer: Medicare Other | Attending: Internal Medicine

## 2017-01-08 ENCOUNTER — Ambulatory Visit: Payer: Medicare Other | Admitting: Internal Medicine

## 2017-01-14 ENCOUNTER — Other Ambulatory Visit: Payer: Self-pay | Admitting: Internal Medicine

## 2017-01-14 DIAGNOSIS — R609 Edema, unspecified: Secondary | ICD-10-CM

## 2017-01-14 DIAGNOSIS — R109 Unspecified abdominal pain: Secondary | ICD-10-CM

## 2017-01-15 ENCOUNTER — Other Ambulatory Visit: Payer: Self-pay | Admitting: Internal Medicine

## 2017-01-15 ENCOUNTER — Ambulatory Visit
Admission: RE | Admit: 2017-01-15 | Discharge: 2017-01-15 | Disposition: A | Discharge status: Home / Self Care | Payer: Medicare Other | Source: Ambulatory Visit | Attending: Internal Medicine | Admitting: Internal Medicine

## 2017-01-15 DIAGNOSIS — R0602 Shortness of breath: Secondary | ICD-10-CM

## 2017-01-15 DIAGNOSIS — R609 Edema, unspecified: Secondary | ICD-10-CM | POA: Insufficient documentation

## 2017-01-15 DIAGNOSIS — K760 Fatty (change of) liver, not elsewhere classified: Secondary | ICD-10-CM | POA: Insufficient documentation

## 2017-01-15 DIAGNOSIS — K8020 Calculus of gallbladder without cholecystitis without obstruction: Secondary | ICD-10-CM | POA: Diagnosis not present

## 2017-01-15 DIAGNOSIS — K7689 Other specified diseases of liver: Secondary | ICD-10-CM | POA: Insufficient documentation

## 2017-01-15 DIAGNOSIS — R109 Unspecified abdominal pain: Secondary | ICD-10-CM | POA: Diagnosis present

## 2017-03-07 ENCOUNTER — Encounter: Payer: Self-pay | Admitting: *Deleted

## 2017-03-10 ENCOUNTER — Ambulatory Visit
Admission: RE | Admit: 2017-03-10 | Discharge: 2017-03-10 | Disposition: A | Discharge status: Home / Self Care | Payer: Medicare Other | Source: Ambulatory Visit | Attending: Unknown Physician Specialty | Admitting: Unknown Physician Specialty

## 2017-03-10 ENCOUNTER — Encounter
Admission: RE | Disposition: A | Discharge status: Home / Self Care | Payer: Self-pay | Source: Ambulatory Visit | Attending: Unknown Physician Specialty

## 2017-03-10 ENCOUNTER — Ambulatory Visit: Payer: Medicare Other | Admitting: Anesthesiology

## 2017-03-10 ENCOUNTER — Encounter: Payer: Self-pay | Admitting: *Deleted

## 2017-03-10 DIAGNOSIS — Z79899 Other long term (current) drug therapy: Secondary | ICD-10-CM | POA: Insufficient documentation

## 2017-03-10 DIAGNOSIS — Z8371 Family history of colonic polyps: Secondary | ICD-10-CM | POA: Insufficient documentation

## 2017-03-10 DIAGNOSIS — K2950 Unspecified chronic gastritis without bleeding: Secondary | ICD-10-CM | POA: Insufficient documentation

## 2017-03-10 DIAGNOSIS — B9681 Helicobacter pylori [H. pylori] as the cause of diseases classified elsewhere: Secondary | ICD-10-CM | POA: Diagnosis not present

## 2017-03-10 DIAGNOSIS — I10 Essential (primary) hypertension: Secondary | ICD-10-CM | POA: Insufficient documentation

## 2017-03-10 DIAGNOSIS — K219 Gastro-esophageal reflux disease without esophagitis: Secondary | ICD-10-CM | POA: Insufficient documentation

## 2017-03-10 DIAGNOSIS — J45909 Unspecified asthma, uncomplicated: Secondary | ICD-10-CM | POA: Diagnosis not present

## 2017-03-10 DIAGNOSIS — Z1211 Encounter for screening for malignant neoplasm of colon: Secondary | ICD-10-CM | POA: Insufficient documentation

## 2017-03-10 DIAGNOSIS — E119 Type 2 diabetes mellitus without complications: Secondary | ICD-10-CM | POA: Diagnosis not present

## 2017-03-10 DIAGNOSIS — F329 Major depressive disorder, single episode, unspecified: Secondary | ICD-10-CM | POA: Insufficient documentation

## 2017-03-10 DIAGNOSIS — R12 Heartburn: Secondary | ICD-10-CM | POA: Diagnosis present

## 2017-03-10 DIAGNOSIS — M19019 Primary osteoarthritis, unspecified shoulder: Secondary | ICD-10-CM | POA: Insufficient documentation

## 2017-03-10 DIAGNOSIS — M170 Bilateral primary osteoarthritis of knee: Secondary | ICD-10-CM | POA: Diagnosis not present

## 2017-03-10 DIAGNOSIS — M797 Fibromyalgia: Secondary | ICD-10-CM | POA: Insufficient documentation

## 2017-03-10 DIAGNOSIS — Z7984 Long term (current) use of oral hypoglycemic drugs: Secondary | ICD-10-CM | POA: Diagnosis not present

## 2017-03-10 DIAGNOSIS — K640 First degree hemorrhoids: Secondary | ICD-10-CM | POA: Insufficient documentation

## 2017-03-10 DIAGNOSIS — M479 Spondylosis, unspecified: Secondary | ICD-10-CM | POA: Insufficient documentation

## 2017-03-10 HISTORY — DX: Other allergy status, other than to drugs and biological substances: Z91.09

## 2017-03-10 HISTORY — PX: COLONOSCOPY WITH PROPOFOL: SHX5780

## 2017-03-10 HISTORY — PX: ESOPHAGOGASTRODUODENOSCOPY (EGD) WITH PROPOFOL: SHX5813

## 2017-03-10 HISTORY — DX: Sleep apnea, unspecified: G47.30

## 2017-03-10 LAB — GLUCOSE, CAPILLARY: Glucose-Capillary: 92 mg/dL (ref 65–99)

## 2017-03-10 SURGERY — COLONOSCOPY WITH PROPOFOL
Anesthesia: General

## 2017-03-10 MED ORDER — GLYCOPYRROLATE 0.2 MG/ML IJ SOLN
INTRAMUSCULAR | Status: DC | PRN
  Administered 2017-03-10: 0.2 mg via INTRAVENOUS

## 2017-03-10 MED ORDER — PROPOFOL 500 MG/50ML IV EMUL
INTRAVENOUS | Status: DC | PRN
  Administered 2017-03-10: 150 ug/kg/min via INTRAVENOUS

## 2017-03-10 MED ORDER — PROPOFOL 500 MG/50ML IV EMUL
INTRAVENOUS | Status: AC
  Filled 2017-03-10: qty 50

## 2017-03-10 MED ORDER — GLYCOPYRROLATE 0.2 MG/ML IJ SOLN
INTRAMUSCULAR | Status: AC
  Filled 2017-03-10: qty 1

## 2017-03-10 MED ORDER — SODIUM CHLORIDE 0.9 % IV SOLN
INTRAVENOUS | Status: DC
  Administered 2017-03-10 (×2): via INTRAVENOUS

## 2017-03-10 MED ORDER — PHENYLEPHRINE HCL 10 MG/ML IJ SOLN
INTRAMUSCULAR | Status: DC | PRN
  Administered 2017-03-10 (×2): 100 ug via INTRAVENOUS

## 2017-03-10 MED ORDER — SODIUM CHLORIDE 0.9 % IV SOLN: INTRAVENOUS | Status: DC

## 2017-03-10 MED ORDER — GENTAMICIN IN SALINE 1-0.9 MG/ML-% IV SOLN
100.0000 mg | Freq: Once | INTRAVENOUS | Status: DC
  Filled 2017-03-10: qty 100

## 2017-03-10 MED ORDER — FENTANYL CITRATE (PF) 100 MCG/2ML IJ SOLN
INTRAMUSCULAR | Status: DC | PRN
  Administered 2017-03-10: 50 ug via INTRAVENOUS

## 2017-03-10 MED ORDER — LIDOCAINE 2% (20 MG/ML) 5 ML SYRINGE
INTRAMUSCULAR | Status: DC | PRN
  Administered 2017-03-10: 40 mg via INTRAVENOUS

## 2017-03-10 MED ORDER — MIDAZOLAM HCL 2 MG/2ML IJ SOLN
INTRAMUSCULAR | Status: AC
Start: 2017-03-10 — End: ?
  Filled 2017-03-10: qty 2

## 2017-03-10 MED ORDER — FENTANYL CITRATE (PF) 100 MCG/2ML IJ SOLN
INTRAMUSCULAR | Status: AC
  Filled 2017-03-10: qty 2

## 2017-03-10 MED ORDER — VANCOMYCIN HCL 1000 MG IV SOLR: 1000.0000 mg | INTRAVENOUS | Status: DC

## 2017-03-10 MED ORDER — PROPOFOL 10 MG/ML IV BOLUS
INTRAVENOUS | Status: DC | PRN
  Administered 2017-03-10: 100 mg via INTRAVENOUS

## 2017-03-10 MED ORDER — GENTAMICIN SULFATE 40 MG/ML IJ SOLN
100.0000 mg | Freq: Once | INTRAVENOUS | Status: AC
  Administered 2017-03-10: 100 mg via INTRAVENOUS
  Filled 2017-03-10: qty 2.5

## 2017-03-10 MED ORDER — PROPOFOL 10 MG/ML IV BOLUS
INTRAVENOUS | Status: AC
  Filled 2017-03-10: qty 20

## 2017-03-10 MED ORDER — MIDAZOLAM HCL 5 MG/5ML IJ SOLN
INTRAMUSCULAR | Status: DC | PRN
Start: 2017-03-10 — End: 2017-03-10
  Administered 2017-03-10: 1 mg via INTRAVENOUS

## 2017-03-10 MED ORDER — VANCOMYCIN HCL IN DEXTROSE 1-5 GM/200ML-% IV SOLN
1000.0000 mg | INTRAVENOUS | Status: AC
  Administered 2017-03-10: 1000 mg via INTRAVENOUS

## 2017-03-10 NOTE — H&P (Signed)
Primary Care Physician:  McLean-Scocuzza, Pasty Spillers, MD Primary Gastroenterologist:  Dr. Mechele Collin  Pre-Procedure History & Physical: HPI:  Amanda [name] is a 51 y.o. female is here for an endoscopy and colonoscopy.   Past Medical History:  Diagnosis Date  . Arthritis    knees, shoulder, upper back  . Asthma   . Diabetes mellitus without complication (HCC)   . Environmental allergies   . Fibromyalgia   . GERD (gastroesophageal reflux disease)   . Headache    migraines - 5x/mo  . Hypertension   . Migraines   . Motion sickness    ships  . Sleep apnea   . Thyroid nodule    bilateral and goiter  . Wears contact lenses   . Wears dentures    partial upper    Past Surgical History:  Procedure Laterality Date  . ABDOMINAL HYSTERECTOMY  2000's  . ABDOMINAL SURGERY     laparoscopy x4 with lysis of adhesions  . APPENDECTOMY  1986  . CESAREAN SECTION  1992  . ECTOPIC PREGNANCY SURGERY  2000's  . HERNIA REPAIR    . JOINT REPLACEMENT Right 12/16/12   knee- medial - makoplasty  . KNEE ARTHROSCOPY Right 2006   partial medial and lateral meniscectomies  . KNEE ARTHROSCOPY Right 10/06/2015   Procedure: RIGHT KNEE ARTHROSCOPY WITH DEBRIDEMENT;  Surgeon: Erin Sons, MD;  Location: [facility];  Service: Orthopedics;  Laterality: Right;  Diabetic - insulin and oral meds  . ROTATOR CUFF REPAIR Left 2006  . TUBAL LIGATION  2000's    Prior to Admission medications   Medication Sig Start Date End Date Taking? Authorizing Provider  ACCU-CHEK FASTCLIX LANCETS MISC by Does not apply route as directed.   Yes [provider]  ALPRAZolam Prudy Feeler) 1 MG tablet Take 1 mg by mouth at bedtime as needed for anxiety or sleep.   Yes [provider]  ARIPiprazole (ABILIFY) 10 MG tablet Take 10 mg by mouth daily as needed.   Yes [provider]  Ascorbic Acid (VITAMIN C) 1000 MG tablet Take 1,000 mg by mouth daily.   Yes [provider]  BIOTIN PO Take by  mouth daily.   Yes [provider]  cycloSPORINE (RESTASIS) 0.05 % ophthalmic emulsion 1 drop 2 (two) times daily.   Yes [provider]  dexmethylphenidate (FOCALIN) 5 MG tablet Take 5 mg by mouth 2 (two) times daily.   Yes [provider]  diltiazem (DILACOR XR) 240 MG 24 hr capsule Take 240 mg by mouth daily.   Yes [provider]  flunisolide (NASALIDE) 25 MCG/ACT (0.025%) SOLN Place 2 sprays into the nose daily as needed.   Yes [provider]  fluticasone furoate-vilanterol (BREO ELLIPTA) 200-25 MCG/INH AEPB Inhale 1 puff into the lungs daily. 06/05/16  Yes Shane Crutch, MD  furosemide (LASIX) 10 MG/ML solution Take 20 mg by mouth daily.   Yes [provider]  glucosamine-chondroitin 500-400 MG tablet Take 1 tablet by mouth 3 (three) times daily.   Yes [provider]  glucose blood (ACCU-CHEK SMARTVIEW) test strip 1 each by Other route as needed for other. Use as instructed   Yes [provider]  insulin detemir (LEVEMIR) 100 UNIT/ML injection Inject 10 Units into the skin at bedtime.   Yes [provider]  ipratropium-albuterol (DUONEB) 0.5-2.5 (3) MG/3ML SOLN Take 3 mLs by nebulization every 4 (four) hours as needed.   Yes [provider]  isosorbide mononitrate (IMDUR) 30 MG 24  hr tablet Take 30 mg by mouth daily.   Yes [provider]  losartan (COZAAR) 25 MG tablet Take 25 mg by mouth daily.   Yes [provider]  meloxicam (MOBIC) 15 MG tablet Take 15 mg by mouth daily.   Yes [provider]  metoprolol succinate (TOPROL-XL) 50 MG 24 hr tablet Take 50 mg by mouth daily. Take with or immediately following a meal.   Yes [provider]  mirtazapine (REMERON) 30 MG tablet Take 30 mg by mouth at bedtime.   Yes [provider]  montelukast (SINGULAIR) 10 MG tablet Take 1 tablet (10 mg total) by mouth daily. 06/05/16 06/05/17 Yes Shane Crutchamachandran, Pradeep, MD   Multiple Vitamin (MULTIVITAMIN) tablet Take 1 tablet by mouth daily.   Yes [provider]  omeprazole (PRILOSEC) 40 MG capsule Take 40 mg by mouth daily. 1 hr before supper   Yes [provider]  omeprazole (PRILOSEC) 40 MG capsule Take 40 mg by mouth daily.   Yes [provider]  pregabalin (LYRICA) 200 MG capsule Take 200 mg by mouth 2 (two) times daily.   Yes [provider]  sucralfate (CARAFATE) 1 g tablet Take 1 g by mouth 4 (four) times daily -  with meals and at bedtime.   Yes [provider]  SUMAtriptan (IMITREX) 25 MG tablet Take 25 mg by mouth every 2 (two) hours as needed for migraine. May repeat in 2 hours if headache persists or recurs.   Yes [provider]  tiZANidine (ZANAFLEX) 4 MG tablet Take 4 mg by mouth every 6 (six) hours as needed for muscle spasms.   Yes [provider]  zolpidem (AMBIEN) 5 MG tablet Take 5 mg by mouth at bedtime as needed for sleep.   Yes [provider]  albuterol (PROVENTIL HFA) 108 (90 Base) MCG/ACT inhaler Inhale 2 puffs into the lungs every 4 (four) hours as needed for wheezing or shortness of breath. 06/05/16   Shane Crutchamachandran, Pradeep, MD  canagliflozin (INVOKANA) 300 MG TABS tablet Take 300 mg by mouth daily before breakfast.    [provider]  dexmethylphenidate (FOCALIN) 10 MG tablet Take 10 mg by mouth 2 (two) times daily. 08/16/16   [provider]  Diclofenac Sodium 3 % GEL Place 1 application onto the skin every 12 (twelve) hours as needed. 10/08/16   Myrna BlazerSchaevitz, David Matthew, MD  DULoxetine (CYMBALTA) 60 MG capsule Take 60 mg by mouth daily. AM    [provider]  ibuprofen (ADVIL,MOTRIN) 600 MG tablet Take 1 tablet (600 mg total) by mouth every 8 (eight) hours as needed. 09/27/16   Joni ReiningSmith, Ronald K, PA-C  metFORMIN (GLUCOPHAGE) 500 MG tablet Take 1,000 mg by mouth 2 (two) times daily with a meal.     [provider]    Allergies as of  03/06/2017 - Review Complete 10/18/2016  Allergen Reaction Noted  . Penicillins Anaphylaxis 05/28/2015  . Ace inhibitors Swelling 05/28/2015    Family History  Problem Relation Age of Onset  . Hypertension Mother   . Hypertension Father   . Diabetes Father   . Allergies Father     Social History   Social History  . Marital status: Divorced    Spouse name: N/A  . Number of children: N/A  . Years of education: N/A   Occupational History  . Not on file.   Social History Main Topics  . Smoking status: Never Smoker  . Smokeless tobacco: Never Used  . Alcohol  use No  . Drug use: No  . Sexual activity: Yes    Birth control/ protection: Surgical   Other Topics Concern  . Not on file   Social History Narrative  . No narrative on file    Review of Systems: See HPI, otherwise negative ROS  Physical Exam: BP 125/65   Pulse 64   Temp 97.9 F (36.6 C) (Oral)   Resp 14   Ht 5\' 6"  (1.676 m)   Wt 89.8 kg (198 lb)   SpO2 100%   BMI 31.96 kg/m  General:   Alert,  pleasant and cooperative in NAD Head:  Normocephalic and atraumatic. Neck:  Supple; no masses or thyromegaly. Lungs:  Clear throughout to auscultation.    Heart:  Regular rate and rhythm. Abdomen:  Soft, nontender and nondistended. Normal bowel sounds, without guarding, and without rebound.   Neurologic:  Alert and  oriented x4;  grossly normal neurologically.  Impression/Plan: [name] is here for an endoscopy and colonoscopy to be performed for FH colon polyps, GERD  Risks, benefits, limitations, and alternatives regarding  endoscopy and colonoscopy have been reviewed with the patient.  Questions have been answered.  All parties agreeable.   Lynnae Prude, MD  03/10/2017, 9:24 AM

## 2017-03-10 NOTE — Anesthesia Postprocedure Evaluation (Signed)
Anesthesia Post Note  Patient: Amanda [name]  Procedure(s) Performed: Procedure(s) (LRB): COLONOSCOPY WITH PROPOFOL (N/A) ESOPHAGOGASTRODUODENOSCOPY (EGD) WITH PROPOFOL (N/A)  Patient location during evaluation: Endoscopy Anesthesia Type: General Level of consciousness: awake and alert Pain management: pain level controlled Vital Signs Assessment: post-procedure vital signs reviewed and stable Respiratory status: spontaneous breathing, nonlabored ventilation, respiratory function stable and patient connected to nasal cannula oxygen Cardiovascular status: blood pressure returned to baseline and stable Postop Assessment: no signs of nausea or vomiting Anesthetic complications: no     Last Vitals:  Vitals:   03/10/17 1021 03/10/17 1031  BP: (!) 131/93 123/77  Pulse: 87 82  Resp: 16 (!) 23  Temp:      Last Pain:  Vitals:   03/10/17 1001  TempSrc: Tympanic                 Cleda MccreedyJoseph K Kimmie Berggren

## 2017-03-10 NOTE — Anesthesia Post-op Follow-up Note (Cosign Needed)
Anesthesia QCDR form completed.        

## 2017-03-10 NOTE — Op Note (Signed)
[facility] Gastroenterology Patient Name: Amanda [name] Procedure Date: 03/10/2017 9:12 AM MRN: [id number] Account #: [id number] Date of Birth: Jun 12, 1966 Admit Type: Outpatient Age: [age] [facility] 1 Gender: Female Note Status: Finalized Procedure:            Upper GI endoscopy Indications:          Heartburn Providers:            Scot Junobert T. Elliott, MD Referring MD:         Pasty Spillersracy N. Mclean-Scocuzza MD, MD (Referring MD) Medicines:            Propofol per Anesthesia Complications:        No immediate complications. Procedure:            Pre-Anesthesia Assessment:                       - After reviewing the risks and benefits, the patient                        was deemed in satisfactory condition to undergo the                        procedure.                       After obtaining informed consent, the endoscope was                        passed under direct vision. Throughout the procedure,                        the patient's blood pressure, pulse, and oxygen                        saturations were monitored continuously. The                        Colonoscope was introduced through the mouth, and                        advanced to the second part of duodenum. The upper GI                        endoscopy was accomplished without difficulty. The                        patient tolerated the procedure well. Findings:      The examined esophagus was normal. GEJ 37cm from teeth.      Localized minimal inflammation characterized by slight erythema and       granularity was found in the gastric antrum. Biopsies were taken with a       cold forceps for histology. Biopsies were taken with a cold forceps for       Helicobacter pylori testing.      The examined duodenum was normal. Impression:           - Normal esophagus.                       - Gastritis. Biopsied.                       -  Normal examined duodenum. Recommendation:       - Await pathology  results. Scot Jun, MD 03/10/2017 9:38:53 AM This report has been signed electronically. Number of Addenda: 0 Note Initiated On: 03/10/2017 9:12 AM      [facility]

## 2017-03-10 NOTE — Transfer of Care (Signed)
Immediate Anesthesia Transfer of Care Note  Patient: Amanda [name]  Procedure(s) Performed: Procedure(s): COLONOSCOPY WITH PROPOFOL (N/A) ESOPHAGOGASTRODUODENOSCOPY (EGD) WITH PROPOFOL (N/A)  Patient Location: PACU and Endoscopy Unit  Anesthesia Type:General  Level of Consciousness: sedated  Airway & Oxygen Therapy: Patient Spontanous Breathing and Patient connected to nasal cannula oxygen  Post-op Assessment: Report given to RN and Post -op Vital signs reviewed and stable  Post vital signs: Reviewed and stable  Last Vitals:  Vitals:   03/10/17 0716  BP: 125/65  Pulse: 64  Resp: 14  Temp: 36.6 C    Last Pain:  Vitals:   03/10/17 0716  TempSrc: Oral         Complications: No apparent anesthesia complications

## 2017-03-10 NOTE — Op Note (Addendum)
[facility] G[facility] Patient Name: Amanda [name] Procedure Date: 03/10/2017 9:11 AM MRN: [id number] Account #: [id number] Date of Birth: 1965/12/24 Admit Type: Outpatient Age: [age] [facility] Gender: Female Note Status: Finalized Procedure:            Colonoscopy Indications:          Colon cancer screening in patient at increased risk:                        Family history of 1st-degree relative with colon polyps Providers:            Scot Junobert T. Woodruff Skirvin, MD Referring MD:         Pasty Spillersracy N. Mclean-Scocuzza MD, MD (Referring MD) Medicines:            Propofol per Anesthesia Complications:        No immediate complications. Procedure:            Pre-Anesthesia Assessment:                       - After reviewing the risks and benefits, the patient                        was deemed in satisfactory condition to undergo the                        procedure.                       After obtaining informed consent, the colonoscope was                        passed under direct vision. Throughout the procedure,                        the patient's blood pressure, pulse, and oxygen                        saturations were monitored continuously. The                        Colonoscope was introduced through the anus and                        advanced to the the cecum, identified by appendiceal                        orifice and ileocecal valve. The colonoscopy was                        performed without difficulty. The patient tolerated the                        procedure well. The quality of the bowel preparation                        was good. Findings:      A diminutive polyp was found in the sigmoid colon. The polyp was       sessile. The polyp was removed with a jumbo cold forceps. Resection and       retrieval were complete.  Internal hemorrhoids were found during endoscopy. The hemorrhoids were       small and Grade I (internal hemorrhoids that  do not prolapse).      The exam was otherwise without abnormality. Impression:           - One diminutive polyp in the sigmoid colon, removed                        with a jumbo cold forceps. Resected and retrieved.                       - Internal hemorrhoids.                       - The examination was otherwise normal. Recommendation:       - Await pathology results. Scot Jun, MD 03/10/2017 9:58:11 AM This report has been signed electronically. Number of Addenda: 0 Note Initiated On: 03/10/2017 9:11 AM Scope Withdrawal Time: 0 hours 9 minutes 31 seconds  Total Procedure Duration: 0 hours 15 minutes 1 second       [facility]

## 2017-03-10 NOTE — Anesthesia Preprocedure Evaluation (Addendum)
Anesthesia Evaluation  Patient identified by MRN, date of birth, ID band Patient awake    Reviewed: Allergy & Precautions, H&P , NPO status , Patient's Chart, lab work & pertinent test results  History of Anesthesia Complications (+) DIFFICULT IV STICK / SPECIAL LINE and history of anesthetic complications  Airway Mallampati: III  TM Distance: <3 FB Neck ROM: limited    Dental  (+) Chipped, Caps, Upper Dentures, Poor Dentition, Missing   Pulmonary asthma , sleep apnea , pneumonia,           Cardiovascular Exercise Tolerance: Good hypertension, (-) angina(-) Past MI and (-) DOE      Neuro/Psych  Headaches, PSYCHIATRIC DISORDERS Depression    GI/Hepatic Neg liver ROS, GERD  Medicated and Controlled,  Endo/Other  diabetes, Type 2  Renal/GU negative Renal ROS  negative genitourinary   Musculoskeletal  (+) Arthritis , Fibromyalgia -  Abdominal   Peds  Hematology negative hematology ROS (+)   Anesthesia Other Findings Past Medical History: No date: Arthritis     Comment: knees, shoulder, upper back No date: Asthma No date: Diabetes mellitus without complication (HCC) No date: Environmental allergies No date: Fibromyalgia No date: GERD (gastroesophageal reflux disease) No date: Headache     Comment: migraines - 5x/mo No date: Hypertension No date: Migraines No date: Motion sickness     Comment: ships No date: Sleep apnea No date: Thyroid nodule     Comment: bilateral and goiter No date: Wears contact lenses No date: Wears dentures     Comment: partial upper  Past Surgical History: 2000's: ABDOMINAL HYSTERECTOMY No date: ABDOMINAL SURGERY     Comment: laparoscopy x4 with lysis of adhesions 1986: APPENDECTOMY 1992: CESAREAN SECTION 2000's: ECTOPIC PREGNANCY SURGERY No date: HERNIA REPAIR 12/16/12: JOINT REPLACEMENT Right     Comment: knee- medial - makoplasty 2006: KNEE ARTHROSCOPY Right     Comment:  partial medial and lateral meniscectomies 10/06/2015: KNEE ARTHROSCOPY Right     Comment: Procedure: RIGHT KNEE ARTHROSCOPY WITH               DEBRIDEMENT;  Surgeon: Erin SonsHarold Kernodle, MD;                [facility];  Service:               Orthopedics;  Laterality: Right;  Diabetic -               insulin and oral meds 2006: ROTATOR CUFF REPAIR Left 2000's: TUBAL LIGATION  BMI    Body Mass Index:  31.96 kg/m      Reproductive/Obstetrics negative OB ROS                            Anesthesia Physical Anesthesia Plan  ASA: III  Anesthesia Plan: General   Post-op Pain Management:    Induction: Intravenous  PONV Risk Score and Plan:   Airway Management Planned: Natural Airway and Nasal Cannula  Additional Equipment:   Intra-op Plan:   Post-operative Plan:   Informed Consent: I have reviewed the patients History and Physical, chart, labs and discussed the procedure including the risks, benefits and alternatives for the proposed anesthesia with the patient or authorized representative who has indicated his/her understanding and acceptance.   Dental Advisory Given  Plan Discussed with: Anesthesiologist, CRNA and Surgeon  Anesthesia Plan Comments: (Patient consented for risks of anesthesia including but not limited to:  - adverse reactions to  medications - damage to teeth, lips or other oral mucosa - sore throat or hoarseness - Damage to heart, brain, lungs or loss of life  Patient voiced understanding.)        Anesthesia Quick Evaluation

## 2017-03-11 ENCOUNTER — Telehealth: Payer: Self-pay

## 2017-03-11 ENCOUNTER — Encounter: Payer: Self-pay | Admitting: Unknown Physician Specialty

## 2017-03-11 LAB — SURGICAL PATHOLOGY

## 2017-03-11 NOTE — Telephone Encounter (Signed)
Called patient but had to leave Amanda [name] a detailed message letting Amanda [name] know that we received Amanda [name] colonoscopy and EGD results. Therefore, I had called Amanda [name] to let Amanda [name] know that I had scheduled a follow up appointment with Dr. Aleen CampiPiscoya on 03/17/2017 to go over results and discuss if she will need surgery or not.

## 2017-03-11 NOTE — Telephone Encounter (Signed)
Appointment remainder were mailed to patient.

## 2017-03-17 ENCOUNTER — Other Ambulatory Visit: Payer: Self-pay

## 2017-03-17 ENCOUNTER — Ambulatory Visit (INDEPENDENT_AMBULATORY_CARE_PROVIDER_SITE_OTHER): Payer: Medicare Other | Admitting: Surgery

## 2017-03-17 ENCOUNTER — Encounter: Payer: Self-pay | Admitting: Surgery

## 2017-03-17 VITALS — BP 124/79 | HR 65 | Temp 98.1°F | Ht 66.0 in | Wt 189.0 lb

## 2017-03-17 DIAGNOSIS — K432 Incisional hernia without obstruction or gangrene: Secondary | ICD-10-CM

## 2017-03-17 DIAGNOSIS — B9681 Helicobacter pylori [H. pylori] as the cause of diseases classified elsewhere: Secondary | ICD-10-CM | POA: Diagnosis not present

## 2017-03-17 DIAGNOSIS — K8020 Calculus of gallbladder without cholecystitis without obstruction: Secondary | ICD-10-CM | POA: Diagnosis not present

## 2017-03-17 DIAGNOSIS — K2970 Gastritis, unspecified, without bleeding: Secondary | ICD-10-CM | POA: Diagnosis not present

## 2017-03-17 MED ORDER — CLARITHROMYCIN 500 MG PO TABS: 500.0000 mg | ORAL_TABLET | Freq: Two times a day (BID) | ORAL | 0 refills | Status: AC

## 2017-03-17 MED ORDER — METRONIDAZOLE 500 MG PO TABS: 500.0000 mg | ORAL_TABLET | Freq: Three times a day (TID) | ORAL | 0 refills | Status: AC

## 2017-03-17 NOTE — Progress Notes (Signed)
03/17/2017  History of Present Illness: Amanda [name] is a 51 y.o. female last seen in the office on 2/15 for abdominal pain. She was noted to have on CT scan a small ventral hernia at the site of a prior mesh repair. Looking at the CT it appears that the superior portion of the mesh has become detached from the abdominal wall, allowing a portion of falciform ligament and fat to go through. Patient reports that this is still reducible and does still cause some discomfort. She also reports that she has epigastric pain as well as right upper quadrant pain that is fairly consistent. Does not know if this really related to food only but she feels it it's mostly all the time. Denies having any fevers or chills. At the time of the initial visit she was being worked up for her abdominal pain and finally had an EGD and colonoscopy with Dr. Mechele Collin on 7/9. Her colonoscopy found a polyp in the sigmoid colon which was removed and negative for any dysplasia or malignancy on pathology. Her EGD showed gastritis which was biopsied and positive for H. Pylori.  In the interim she also had an abdominal ultrasound on 5/16 which showed cholelithiasis with no evidence of cholecystitis.    Past Medical History: Past Medical History:  Diagnosis Date  . Arthritis    knees, shoulder, upper back  . Asthma   . Diabetes mellitus without complication (HCC)   . Environmental allergies   . Fibromyalgia   . GERD (gastroesophageal reflux disease)   . Headache    migraines - 5x/mo  . Hypertension   . Migraines   . Motion sickness    ships  . Sleep apnea   . Thyroid nodule    bilateral and goiter  . Wears contact lenses   . Wears dentures    partial upper     Past Surgical History: Past Surgical History:  Procedure Laterality Date  . ABDOMINAL HYSTERECTOMY  2000's  . ABDOMINAL SURGERY     laparoscopy x4 with lysis of adhesions  . APPENDECTOMY  1986  . CESAREAN SECTION  1992  . COLONOSCOPY WITH PROPOFOL N/A  03/10/2017   Procedure: COLONOSCOPY WITH PROPOFOL;  Surgeon: Scot Jun, MD;  Location: [facility] ENDOSCOPY;  Service: Endoscopy;  Laterality: N/A;  . ECTOPIC PREGNANCY SURGERY  2000's  . ESOPHAGOGASTRODUODENOSCOPY (EGD) WITH PROPOFOL N/A 03/10/2017   Procedure: ESOPHAGOGASTRODUODENOSCOPY (EGD) WITH PROPOFOL;  Surgeon: Scot Jun, MD;  Location: [facility] ENDOSCOPY;  Service: Endoscopy;  Laterality: N/A;  . HERNIA REPAIR    . JOINT REPLACEMENT Right 12/16/12   knee- medial - makoplasty  . KNEE ARTHROSCOPY Right 2006   partial medial and lateral meniscectomies  . KNEE ARTHROSCOPY Right 10/06/2015   Procedure: RIGHT KNEE ARTHROSCOPY WITH DEBRIDEMENT;  Surgeon: Erin Sons, MD;  Location: [facility];  Service: Orthopedics;  Laterality: Right;  Diabetic - insulin and oral meds  . ROTATOR CUFF REPAIR Left 2006  . TUBAL LIGATION  2000's    Home Medications: Prior to Admission medications   Medication Sig Start Date End Date Taking? Authorizing Provider  ACCU-CHEK FASTCLIX LANCETS MISC by Does not apply route as directed.   Yes [provider]  albuterol (PROVENTIL HFA) 108 (90 Base) MCG/ACT inhaler Inhale 2 puffs into the lungs every 4 (four) hours as needed for wheezing or shortness of breath. 06/05/16  Yes Shane Crutch, MD  ALPRAZolam Prudy Feeler) 1 MG tablet Take 1 mg by mouth at bedtime as needed for anxiety  or sleep.   Yes [provider]  ARIPiprazole (ABILIFY) 10 MG tablet Take 10 mg by mouth daily as needed.   Yes [provider]  Ascorbic Acid (VITAMIN C) 1000 MG tablet Take 1,000 mg by mouth daily.   Yes [provider]  BIOTIN PO Take by mouth daily.   Yes [provider]  canagliflozin (INVOKANA) 300 MG TABS tablet Take 300 mg by mouth daily before breakfast.   Yes [provider]  cycloSPORINE (RESTASIS) 0.05 % ophthalmic emulsion 1 drop 2 (two) times daily.   Yes [provider]  dexmethylphenidate  (FOCALIN) 10 MG tablet Take 10 mg by mouth 2 (two) times daily. 08/16/16  Yes [provider]  Diclofenac Sodium 3 % GEL Place 1 application onto the skin every 12 (twelve) hours as needed. 10/08/16  Yes Schaevitz, Myra Rude, MD  diltiazem (DILACOR XR) 240 MG 24 hr capsule Take 240 mg by mouth daily.   Yes [provider]  DULoxetine (CYMBALTA) 60 MG capsule Take 60 mg by mouth daily. AM   Yes [provider]  flunisolide (NASALIDE) 25 MCG/ACT (0.025%) SOLN Place 2 sprays into the nose daily as needed.   Yes [provider]  fluticasone furoate-vilanterol (BREO ELLIPTA) 200-25 MCG/INH AEPB Inhale 1 puff into the lungs daily. 06/05/16  Yes Shane Crutch, MD  furosemide (LASIX) 10 MG/ML solution Take 20 mg by mouth daily.   Yes [provider]  glucosamine-chondroitin 500-400 MG tablet Take 1 tablet by mouth 3 (three) times daily.   Yes [provider]  glucose blood (ACCU-CHEK SMARTVIEW) test strip 1 each by Other route as needed for other. Use as instructed   Yes [provider]  ibuprofen (ADVIL,MOTRIN) 600 MG tablet Take 1 tablet (600 mg total) by mouth every 8 (eight) hours as needed. 09/27/16  Yes Joni Reining, PA-C  insulin detemir (LEVEMIR) 100 UNIT/ML injection Inject 10 Units into the skin at bedtime.   Yes [provider]  ipratropium-albuterol (DUONEB) 0.5-2.5 (3) MG/3ML SOLN Take 3 mLs by nebulization every 4 (four) hours as needed.   Yes [provider]  isosorbide mononitrate (IMDUR) 30 MG 24 hr tablet Take 30 mg by mouth daily.   Yes [provider]  LINZESS 290 MCG CAPS capsule Take 1 tablet by mouth daily. 02/28/17  Yes [provider]  losartan (COZAAR) 25 MG tablet Take 25 mg by mouth daily.   Yes [provider]  meloxicam (MOBIC) 15 MG tablet Take 15 mg by mouth daily.   Yes [provider]  metFORMIN (GLUCOPHAGE) 500 MG tablet Take 1,000 mg by mouth 2  (two) times daily with a meal.    Yes [provider]  metoprolol succinate (TOPROL-XL) 50 MG 24 hr tablet Take 50 mg by mouth daily. Take with or immediately following a meal.   Yes [provider]  mirtazapine (REMERON) 30 MG tablet Take 30 mg by mouth at bedtime.   Yes [provider]  montelukast (SINGULAIR) 10 MG tablet Take 1 tablet (10 mg total) by mouth daily. 06/05/16 06/05/17 Yes Shane Crutch, MD  Multiple Vitamin (MULTIVITAMIN) tablet Take 1 tablet by mouth daily.   Yes [provider]  omeprazole (PRILOSEC) 40 MG capsule Take 40 mg by mouth daily. 1 hr before supper   Yes [provider]  pregabalin (LYRICA) 200 MG capsule Take 200 mg by mouth 2 (two) times daily.   Yes [provider]  sucralfate (CARAFATE) 1 g  tablet Take 1 g by mouth 4 (four) times daily -  with meals and at bedtime.   Yes [provider]  SUMAtriptan (IMITREX) 25 MG tablet Take 25 mg by mouth every 2 (two) hours as needed for migraine. May repeat in 2 hours if headache persists or recurs.   Yes [provider]  tiZANidine (ZANAFLEX) 4 MG tablet Take 4 mg by mouth every 6 (six) hours as needed for muscle spasms.   Yes [provider]  zolpidem (AMBIEN) 5 MG tablet Take 5 mg by mouth at bedtime as needed for sleep.   Yes [provider]  clarithromycin (BIAXIN) 500 MG tablet Take 1 tablet (500 mg total) by mouth 2 (two) times daily. 03/17/17 03/31/17  Henrene Dodge, MD  metroNIDAZOLE (FLAGYL) 500 MG tablet Take 1 tablet (500 mg total) by mouth 3 (three) times daily. 03/17/17 03/31/17  Henrene Dodge, MD    Allergies: Allergies  Allergen Reactions  . Penicillins Anaphylaxis    Has patient had a PCN reaction causing immediate rash, facial/tongue/throat swelling, SOB or lightheadedness with hypotension: Yes Has patient had a PCN reaction causing severe rash involving mucus membranes or skin necrosis: No Has patient had a PCN  reaction that required hospitalization No Has patient had a PCN reaction occurring within the last 10 years: No If all of the above answers are "NO", then may proceed with Cephalosporin use.   . Ace Inhibitors Swelling    Review of Systems: Review of Systems  Constitutional: Negative for chills and fever.  Respiratory: Negative for shortness of breath.   Cardiovascular: Negative for chest pain.  Gastrointestinal: Positive for abdominal pain. Negative for constipation, diarrhea, nausea and vomiting.    Physical Exam BP 124/79   Pulse 65   Temp 98.1 F (36.7 C) (Oral)   Ht 5\' 6"  (1.676 m)   Wt 85.7 kg (189 lb)   BMI 30.51 kg/m  CONSTITUTIONAL: No acute distress RESPIRATORY:  Lungs are clear, and breath sounds are equal bilaterally. Normal respiratory effort without pathologic use of accessory muscles. CARDIOVASCULAR: Heart is regular without murmurs, gallops, or rubs. GI: The abdomen is soft, nondistended, with tenderness to palpation at the epigastric area, right upper quadrant area, and with mild discomfort at the site of her recurrent hernia. The hernia defect is palpable measuring approximately 1-2 cm and is reducible. There were no palpable masses. Low midline incision is well-healed with no evidence of a hernia. MUSCULOSKELETAL:  Normal muscle strength and tone in all four extremities.  No peripheral edema or cyanosis. SKIN: Skin turgor is normal. There are no pathologic skin lesions.  NEUROLOGIC:  Motor and sensation is grossly normal.  Cranial nerves are grossly intact. PSYCH:  Alert and oriented to person, place and time. Affect is normal.  Labs/Imaging: Colonoscopy and EGD per above  Assessment and Plan: This is a 51 y.o. female who presents as follow-up for her abdominal pain.  -I discussed with the patient that currently there may be 3 things going on that could be contributing to her abdominal pain.  Based on her pathology report being positive for H. pylori with  gastritis this could be congenital intra-abdominal pain. We will treat her appropriately with triple therapy with clarithromycin, Flagyl, and PPI. She is allergic to penicillin. She'll have a 2 week course. Afterwards she will follow-up to see her abdominal discomfort is continuing. Her other possibility for abdominal pain could be her gallbladder which has cholelithiasis and on exam she is tender in the right  upper quadrant with a negative Murphy sign. And lastly some of her discomfort could come from her hernia itself although it only fat containing. If anything the discomfort is the least amount at the hernia site. Have discussed that given the these 3 findings we should approach this in a systematic way. First we will treat her for her H. pylori and see how her pain continues. If her pain in right upper quadrant continues we may discuss further about doing a cholecystectomy. Unfortunately with the mesh repair that she has, would rather not do a cholecystectomy at the same time of hernia repair in order to decrease the risk for any potential mesh infection.   The patient understands this plan and all of her quadriceps have been answered.   Howie IllJose Luis Braydin Aloi, MD [facility]

## 2017-03-17 NOTE — Patient Instructions (Addendum)
We have sent you home with an antibiotic. Please take as written and finish all mediatation.  Start taking Prilosec twice a day for 14 days.   We will see you back in office as listed below.  If you have any questions or concerns prior to this visit please give our office a call.

## 2017-04-01 ENCOUNTER — Encounter: Payer: Self-pay | Admitting: Emergency Medicine

## 2017-04-01 ENCOUNTER — Emergency Department
Admission: EM | Admit: 2017-04-01 | Discharge: 2017-04-01 | Disposition: A | Discharge status: Home / Self Care | Payer: Medicare Other | Source: Home / Self Care | Attending: Emergency Medicine | Admitting: Emergency Medicine

## 2017-04-01 ENCOUNTER — Emergency Department: Payer: Medicare Other

## 2017-04-01 DIAGNOSIS — Y929 Unspecified place or not applicable: Secondary | ICD-10-CM | POA: Insufficient documentation

## 2017-04-01 DIAGNOSIS — Y999 Unspecified external cause status: Secondary | ICD-10-CM

## 2017-04-01 DIAGNOSIS — E119 Type 2 diabetes mellitus without complications: Secondary | ICD-10-CM | POA: Insufficient documentation

## 2017-04-01 DIAGNOSIS — Y939 Activity, unspecified: Secondary | ICD-10-CM

## 2017-04-01 DIAGNOSIS — K8020 Calculus of gallbladder without cholecystitis without obstruction: Secondary | ICD-10-CM | POA: Diagnosis not present

## 2017-04-01 DIAGNOSIS — W108XXA Fall (on) (from) other stairs and steps, initial encounter: Secondary | ICD-10-CM

## 2017-04-01 DIAGNOSIS — J45909 Unspecified asthma, uncomplicated: Secondary | ICD-10-CM

## 2017-04-01 DIAGNOSIS — K8012 Calculus of gallbladder with acute and chronic cholecystitis without obstruction: Secondary | ICD-10-CM | POA: Diagnosis not present

## 2017-04-01 DIAGNOSIS — Z794 Long term (current) use of insulin: Secondary | ICD-10-CM

## 2017-04-01 DIAGNOSIS — S93402A Sprain of unspecified ligament of left ankle, initial encounter: Secondary | ICD-10-CM

## 2017-04-01 DIAGNOSIS — I10 Essential (primary) hypertension: Secondary | ICD-10-CM | POA: Insufficient documentation

## 2017-04-01 MED ORDER — ONDANSETRON 4 MG PO TBDP: 4.0000 mg | ORAL_TABLET | Freq: Three times a day (TID) | ORAL | 0 refills | Status: DC | PRN

## 2017-04-01 MED ORDER — HYDROCODONE-ACETAMINOPHEN 5-325 MG PO TABS: 1.0000 | ORAL_TABLET | Freq: Four times a day (QID) | ORAL | 0 refills | Status: DC | PRN

## 2017-04-01 MED ORDER — ACETAMINOPHEN 500 MG PO TABS
1000.0000 mg | ORAL_TABLET | Freq: Once | ORAL | Status: AC
  Administered 2017-04-01: 1000 mg via ORAL
  Filled 2017-04-01: qty 2

## 2017-04-01 NOTE — ED Triage Notes (Addendum)
Pt with left ankle pain after falling this am. Left ankle swelling noted.  Pt states her gallbladder is acting up and had a pain that made her fall. Pt is scheduled to have her gallbladder taken out soon. Pt wants to be seen for her ankle today.

## 2017-04-01 NOTE — ED Provider Notes (Signed)
[facility] Emergency Department Provider Note  ____________________________________________   First MD Initiated Contact with Patient 04/01/17 1023     (approximate)  I have reviewed the triage vital signs and the nursing notes.   HISTORY  Chief Complaint Fall    HPI Amanda [name] is a 51 y.o. female is here complaining of left ankle pain. Patient states that she slipped and fell on some wet steps at the same time she was experiencing some abdominal pain from her gallbladder. She has an appointment with her surgeon tomorrow to set up an appointment for surgery. Today she is only concerned about her ankle. She denies any head injury or loss of consciousness. She rates her pain as a 10 over 10.   Past Medical History:  Diagnosis Date  . Arthritis    knees, shoulder, upper back  . Asthma   . Diabetes mellitus without complication (HCC)   . Environmental allergies   . Fibromyalgia   . GERD (gastroesophageal reflux disease)   . Headache    migraines - 5x/mo  . Hypertension   . Migraines   . Motion sickness    ships  . Sleep apnea   . Thyroid nodule    bilateral and goiter  . Wears contact lenses   . Wears dentures    partial upper    Patient Active Problem List   Diagnosis Date Noted  . Recurrent incisional hernia 03/17/2017  . Calculus of gallbladder without cholecystitis without obstruction 03/17/2017  . Helicobacter pylori gastritis 03/17/2017  . Community acquired pneumonia 09/28/2016  . Depression 09/28/2016  . Diabetes mellitus, type 2 (HCC) 09/28/2016  . Nausea and vomiting 09/28/2016  . Acute respiratory failure with hypoxemia (HCC) 03/18/2016  . Acute right ankle pain 10/04/2014  . Esophageal reflux 06/22/2012    Past Surgical History:  Procedure Laterality Date  . ABDOMINAL HYSTERECTOMY  2000's  . ABDOMINAL SURGERY     laparoscopy x4 with lysis of adhesions  . APPENDECTOMY  1986  . CESAREAN SECTION  1992  .  COLONOSCOPY WITH PROPOFOL N/A 03/10/2017   Procedure: COLONOSCOPY WITH PROPOFOL;  Surgeon: Scot Jun, MD;  Location: [facility] ENDOSCOPY;  Service: Endoscopy;  Laterality: N/A;  . ECTOPIC PREGNANCY SURGERY  2000's  . ESOPHAGOGASTRODUODENOSCOPY (EGD) WITH PROPOFOL N/A 03/10/2017   Procedure: ESOPHAGOGASTRODUODENOSCOPY (EGD) WITH PROPOFOL;  Surgeon: Scot Jun, MD;  Location: [facility] ENDOSCOPY;  Service: Endoscopy;  Laterality: N/A;  . HERNIA REPAIR    . JOINT REPLACEMENT Right 12/16/12   knee- medial - makoplasty  . KNEE ARTHROSCOPY Right 2006   partial medial and lateral meniscectomies  . KNEE ARTHROSCOPY Right 10/06/2015   Procedure: RIGHT KNEE ARTHROSCOPY WITH DEBRIDEMENT;  Surgeon: Erin Sons, MD;  Location: [facility];  Service: Orthopedics;  Laterality: Right;  Diabetic - insulin and oral meds  . ROTATOR CUFF REPAIR Left 2006  . TUBAL LIGATION  2000's    Prior to Admission medications   Medication Sig Start Date End Date Taking? Authorizing Provider  ACCU-CHEK FASTCLIX LANCETS MISC by Does not apply route as directed.    [provider]  albuterol (PROVENTIL HFA) 108 (90 Base) MCG/ACT inhaler Inhale 2 puffs into the lungs every 4 (four) hours as needed for wheezing or shortness of breath. 06/05/16   Shane Crutch, MD  ALPRAZolam Prudy Feeler) 1 MG tablet Take 1 mg by mouth at bedtime as needed for anxiety or sleep.    [provider]  ARIPiprazole (ABILIFY) 10 MG tablet Take  10 mg by mouth daily as needed.    [provider]  Ascorbic Acid (VITAMIN C) 1000 MG tablet Take 1,000 mg by mouth daily.    [provider]  BIOTIN PO Take by mouth daily.    [provider]  canagliflozin (INVOKANA) 300 MG TABS tablet Take 300 mg by mouth daily before breakfast.    [provider]  cycloSPORINE (RESTASIS) 0.05 % ophthalmic emulsion 1 drop 2 (two) times daily.    [provider]  dexmethylphenidate (FOCALIN) 10 MG  tablet Take 10 mg by mouth 2 (two) times daily. 08/16/16   [provider]  Diclofenac Sodium 3 % GEL Place 1 application onto the skin every 12 (twelve) hours as needed. 10/08/16   Schaevitz, Myra Rudeavid Matthew, MD  diltiazem (DILACOR XR) 240 MG 24 hr capsule Take 240 mg by mouth daily.    [provider]  DULoxetine (CYMBALTA) 60 MG capsule Take 60 mg by mouth daily. AM    [provider]  flunisolide (NASALIDE) 25 MCG/ACT (0.025%) SOLN Place 2 sprays into the nose daily as needed.    [provider]  fluticasone furoate-vilanterol (BREO ELLIPTA) 200-25 MCG/INH AEPB Inhale 1 puff into the lungs daily. 06/05/16   Shane Crutchamachandran, Pradeep, MD  furosemide (LASIX) 10 MG/ML solution Take 20 mg by mouth daily.    [provider]  glucosamine-chondroitin 500-400 MG tablet Take 1 tablet by mouth 3 (three) times daily.    [provider]  glucose blood (ACCU-CHEK SMARTVIEW) test strip 1 each by Other route as needed for other. Use as instructed    [provider]  HYDROcodone-acetaminophen (NORCO/VICODIN) 5-325 MG tablet Take 1 tablet by mouth every 6 (six) hours as needed for moderate pain. 04/01/17   Tommi RumpsSummers, Rhonda L, PA-C  ibuprofen (ADVIL,MOTRIN) 600 MG tablet Take 1 tablet (600 mg total) by mouth every 8 (eight) hours as needed. 09/27/16   Joni ReiningSmith, Ronald K, PA-C  insulin detemir (LEVEMIR) 100 UNIT/ML injection Inject 10 Units into the skin at bedtime.    [provider]  ipratropium-albuterol (DUONEB) 0.5-2.5 (3) MG/3ML SOLN Take 3 mLs by nebulization every 4 (four) hours as needed.    [provider]  isosorbide mononitrate (IMDUR) 30 MG 24 hr tablet Take 30 mg by mouth daily.    [provider]  LINZESS 290 MCG CAPS capsule Take 1 tablet by mouth daily. 02/28/17   [provider]  losartan (COZAAR) 25 MG tablet Take 25 mg by mouth daily.    [provider]  meloxicam (MOBIC) 15 MG tablet Take 15 mg by mouth  daily.    [provider]  metFORMIN (GLUCOPHAGE) 500 MG tablet Take 1,000 mg by mouth 2 (two) times daily with a meal.     [provider]  metoprolol succinate (TOPROL-XL) 50 MG 24 hr tablet Take 50 mg by mouth daily. Take with or immediately following a meal.    [provider]  mirtazapine (REMERON) 30 MG tablet Take 30 mg by mouth at bedtime.    [provider]  montelukast (SINGULAIR) 10 MG tablet Take 1 tablet (10 mg total) by mouth daily. 06/05/16 06/05/17  Shane Crutchamachandran, Pradeep, MD  Multiple Vitamin (MULTIVITAMIN) tablet Take 1 tablet by mouth daily.    [provider]  omeprazole (PRILOSEC) 40 MG capsule Take 40 mg by mouth daily. 1 hr before supper    [provider]  ondansetron (ZOFRAN ODT) 4 MG disintegrating tablet Take 1 tablet (4 mg total)  by mouth every 8 (eight) hours as needed for nausea or vomiting. 04/01/17   Bridget HartshornSummers, Rhonda L, PA-C  pregabalin (LYRICA) 200 MG capsule Take 200 mg by mouth 2 (two) times daily.    [provider]  sucralfate (CARAFATE) 1 g tablet Take 1 g by mouth 4 (four) times daily -  with meals and at bedtime.    [provider]  SUMAtriptan (IMITREX) 25 MG tablet Take 25 mg by mouth every 2 (two) hours as needed for migraine. May repeat in 2 hours if headache persists or recurs.    [provider]  tiZANidine (ZANAFLEX) 4 MG tablet Take 4 mg by mouth every 6 (six) hours as needed for muscle spasms.    [provider]  zolpidem (AMBIEN) 5 MG tablet Take 5 mg by mouth at bedtime as needed for sleep.    [provider]    Allergies Penicillins and Ace inhibitors  Family History  Problem Relation Age of Onset  . Hypertension Mother   . Hypertension Father   . Diabetes Father   . Allergies Father     Social History Social History  Substance Use Topics  . Smoking status: Never Smoker  . Smokeless tobacco: Never Used  . Alcohol use No    Review of  Systems Constitutional: No fever/chills Cardiovascular: Denies chest pain. Respiratory: Denies shortness of breath. Gastrointestinal: No acute abdominal pain. No nausea, no vomiting.  Genitourinary: Negative for dysuria. Musculoskeletal: Positive for left ankle pain. Skin: Negative for rash. Neurological: Negative for headaches, focal weakness or numbness.   ____________________________________________   PHYSICAL EXAM:  VITAL SIGNS: ED Triage Vitals  Enc Vitals Group     BP 04/01/17 1006 139/89     Pulse Rate 04/01/17 1006 64     Resp 04/01/17 1006 16     Temp 04/01/17 1006 (!) 96.1 F (35.6 C)     Temp Source 04/01/17 1006 Oral     SpO2 04/01/17 1006 98 %     Weight 04/01/17 1006 189 lb (85.7 kg)     Height --      Head Circumference --      Peak Flow --      Pain Score 04/01/17 1005 10     Pain Loc --      Pain Edu? --      Excl. in GC? --     Constitutional: Alert and oriented. Well appearing and in no acute distress. Eyes: Conjunctivae are normal.  Head: Atraumatic. Neck: No stridor.   Cardiovascular: Normal rate, regular rhythm. Grossly normal heart sounds.  Good peripheral circulation. Respiratory: Normal respiratory effort.  No retractions. Lungs CTAB. Gastrointestinal: Soft and nontender. No distention. Bowel sounds present 4 quadrants. Musculoskeletal: Examination of left ankle there is no gross deformity however there is some moderate soft tissue swelling generalized mostly on the lateral malleolus. Area is tender to touch. No ecchymosis or abrasions were noted. Pulses present. Motor sensory function intact distal to the injury. Neurologic:  Normal speech and language. No gross focal neurologic deficits are appreciated.  Skin:  Skin is warm, dry and intact.  Psychiatric: Mood and affect are normal. Speech and behavior are normal.  ____________________________________________   LABS (all labs ordered are listed, but only abnormal results are  displayed)  Labs Reviewed - No data to display  RADIOLOGY  Dg Ankle Complete Left  Result Date: 04/01/2017 CLINICAL DATA:  Left ankle pain and swelling after falling down the steps this morning. EXAM: LEFT ANKLE  COMPLETE - 3+ VIEW COMPARISON:  None. FINDINGS: No acute fracture or malalignment. Joint spaces are preserved. The talar dome is intact. The ankle mortise is symmetric. Bone mineralization is normal. Diffuse soft tissue swelling about the ankle, most prominent over the lateral malleolus. IMPRESSION: Soft tissue swelling about the ankle.  No fracture. Electronically Signed   By: Obie Dredge M.D.   On: 04/01/2017 10:58    ____________________________________________   PROCEDURES  Procedure(s) performed: None  Procedures  Critical Care performed: No  ____________________________________________   INITIAL IMPRESSION / ASSESSMENT AND PLAN / ED COURSE  Pertinent labs & imaging results that were available during my care of the patient were reviewed by me and considered in my medical decision making (see chart for details).   Ankle splint. She was given a set of crutches. Patient was also given prescription for Norco one every 6 hours as needed for pain and Zofran if needed for nausea. Patient is encouraged to keep ice and elevation to reduce swelling. She will follow-up with her surgeon tomorrow to schedule her gallbladder surgery. She'll follow-up with her     ____________________________________________   FINAL CLINICAL IMPRESSION(S) / ED DIAGNOSES  Final diagnoses:  Sprain of left ankle, unspecified ligament, initial encounter      NEW MEDICATIONS STARTED DURING THIS VISIT:  Discharge Medication List as of 04/01/2017 11:29 AM    START taking these medications   Details  HYDROcodone-acetaminophen (NORCO/VICODIN) 5-325 MG tablet Take 1 tablet by mouth every 6 (six) hours as needed for moderate pain., Starting Tue 04/01/2017, Print    ondansetron (ZOFRAN ODT)  4 MG disintegrating tablet Take 1 tablet (4 mg total) by mouth every 8 (eight) hours as needed for nausea or vomiting., Starting Tue 04/01/2017, Print         Note:  This document was prepared using Dragon voice recognition software and may include unintentional dictation errors.    Tommi Rumps, PA-C 04/01/17 1416    Jeanmarie Plant, MD 04/01/17 [phone]

## 2017-04-01 NOTE — Discharge Instructions (Signed)
Ice and elevate ankle to reduce swelling and pain. Norco every 6 hours as needed for pain. Wear ankle splint for support. Use crutches for weightbearing and walking.

## 2017-04-01 NOTE — ED Notes (Signed)
See triage note  Presents with left ankle pain s/p fall this am  Pain and swelling noted to left ankle  Positive pulses

## 2017-04-02 ENCOUNTER — Inpatient Hospital Stay: Payer: Medicare Other

## 2017-04-02 ENCOUNTER — Inpatient Hospital Stay
Admission: AD | Admit: 2017-04-02 | Discharge: 2017-04-05 | DRG: 419 | Disposition: A | Discharge status: Home / Self Care | Payer: Medicare Other | Source: Ambulatory Visit | Attending: Surgery | Admitting: Surgery

## 2017-04-02 ENCOUNTER — Encounter: Payer: Self-pay | Admitting: Surgery

## 2017-04-02 ENCOUNTER — Ambulatory Visit (INDEPENDENT_AMBULATORY_CARE_PROVIDER_SITE_OTHER): Payer: Medicare Other | Admitting: Surgery

## 2017-04-02 ENCOUNTER — Telehealth: Payer: Self-pay

## 2017-04-02 VITALS — BP 114/73 | HR 58 | Temp 98.3°F | Ht 66.0 in

## 2017-04-02 DIAGNOSIS — E109 Type 1 diabetes mellitus without complications: Secondary | ICD-10-CM | POA: Diagnosis present

## 2017-04-02 DIAGNOSIS — M797 Fibromyalgia: Secondary | ICD-10-CM | POA: Diagnosis present

## 2017-04-02 DIAGNOSIS — J45909 Unspecified asthma, uncomplicated: Secondary | ICD-10-CM | POA: Diagnosis present

## 2017-04-02 DIAGNOSIS — Z6831 Body mass index (BMI) 31.0-31.9, adult: Secondary | ICD-10-CM | POA: Diagnosis not present

## 2017-04-02 DIAGNOSIS — Z79899 Other long term (current) drug therapy: Secondary | ICD-10-CM | POA: Diagnosis not present

## 2017-04-02 DIAGNOSIS — K219 Gastro-esophageal reflux disease without esophagitis: Secondary | ICD-10-CM | POA: Diagnosis present

## 2017-04-02 DIAGNOSIS — K8020 Calculus of gallbladder without cholecystitis without obstruction: Secondary | ICD-10-CM | POA: Diagnosis present

## 2017-04-02 DIAGNOSIS — G4730 Sleep apnea, unspecified: Secondary | ICD-10-CM | POA: Diagnosis present

## 2017-04-02 DIAGNOSIS — Z96651 Presence of right artificial knee joint: Secondary | ICD-10-CM | POA: Diagnosis present

## 2017-04-02 DIAGNOSIS — B9681 Helicobacter pylori [H. pylori] as the cause of diseases classified elsewhere: Secondary | ICD-10-CM

## 2017-04-02 DIAGNOSIS — E669 Obesity, unspecified: Secondary | ICD-10-CM | POA: Diagnosis present

## 2017-04-02 DIAGNOSIS — I10 Essential (primary) hypertension: Secondary | ICD-10-CM | POA: Diagnosis present

## 2017-04-02 DIAGNOSIS — K432 Incisional hernia without obstruction or gangrene: Secondary | ICD-10-CM | POA: Diagnosis not present

## 2017-04-02 DIAGNOSIS — K2970 Gastritis, unspecified, without bleeding: Secondary | ICD-10-CM

## 2017-04-02 DIAGNOSIS — K8012 Calculus of gallbladder with acute and chronic cholecystitis without obstruction: Secondary | ICD-10-CM | POA: Diagnosis present

## 2017-04-02 LAB — COMPREHENSIVE METABOLIC PANEL
ALT: 27 U/L (ref 14–54)
AST: 24 U/L (ref 15–41)
Albumin: 4 g/dL (ref 3.5–5.0)
Alkaline Phosphatase: 75 U/L (ref 38–126)
Anion gap: 7 (ref 5–15)
BUN: 11 mg/dL (ref 6–20)
CO2: 28 mmol/L (ref 22–32)
Calcium: 9.5 mg/dL (ref 8.9–10.3)
Chloride: 104 mmol/L (ref 101–111)
Creatinine, Ser: 0.87 mg/dL (ref 0.44–1.00)
GFR calc Af Amer: >60 mL/min (ref >60)
GFR calc non Af Amer: >60 mL/min (ref >60)
Glucose, Bld: 149 mg/dL — ABNORMAL HIGH (ref 65–99)
Potassium: 3.8 mmol/L (ref 3.5–5.1)
Sodium: 139 mmol/L (ref 135–145)
Total Bilirubin: 0.4 mg/dL (ref 0.3–1.2)
Total Protein: 6.9 g/dL (ref 6.5–8.1)

## 2017-04-02 LAB — CBC WITH DIFFERENTIAL/PLATELET
Basophils Absolute: 0 10*3/uL (ref 0–0.1)
Basophils Relative: 1 %
Eosinophils Absolute: 0 10*3/uL (ref 0–0.7)
Eosinophils Relative: 1 %
HCT: 39 % (ref 35.0–47.0)
Hemoglobin: 12.9 g/dL (ref 12.0–16.0)
Lymphocytes Relative: 45 %
Lymphs Abs: 1.7 10*3/uL (ref 1.0–3.6)
MCH: 30 pg (ref 26.0–34.0)
MCHC: 33 g/dL (ref 32.0–36.0)
MCV: 90.8 fL (ref 80.0–100.0)
Monocytes Absolute: 0.4 10*3/uL (ref 0.2–0.9)
Monocytes Relative: 10 %
Neutro Abs: 1.6 10*3/uL (ref 1.4–6.5)
Neutrophils Relative %: 43 %
Platelets: 235 10*3/uL (ref 150–440)
RBC: 4.3 MIL/uL (ref 3.80–5.20)
RDW: 14.6 % — ABNORMAL HIGH (ref 11.5–14.5)
WBC: 3.8 10*3/uL (ref 3.6–11.0)

## 2017-04-02 LAB — SURGICAL PCR SCREEN
MRSA, PCR: NEGATIVE
Staphylococcus aureus: NEGATIVE

## 2017-04-02 LAB — MAGNESIUM: Magnesium: 1.9 mg/dL (ref 1.7–2.4)

## 2017-04-02 MED ORDER — PANTOPRAZOLE SODIUM 40 MG IV SOLR
40.0000 mg | Freq: Every day | INTRAVENOUS | Status: DC
  Administered 2017-04-02 – 2017-04-04 (×3): 40 mg via INTRAVENOUS
  Filled 2017-04-02 (×3): qty 40

## 2017-04-02 MED ORDER — HEPARIN SODIUM (PORCINE) 5000 UNIT/ML IJ SOLN
5000.0000 [IU] | Freq: Three times a day (TID) | INTRAMUSCULAR | Status: DC
  Administered 2017-04-02 – 2017-04-05 (×7): 5000 [IU] via SUBCUTANEOUS
  Filled 2017-04-02 (×7): qty 1

## 2017-04-02 MED ORDER — PANTOPRAZOLE SODIUM 40 MG IV SOLR: 40.0000 mg | Freq: Every day | INTRAVENOUS | Status: DC

## 2017-04-02 MED ORDER — HYDROMORPHONE HCL 1 MG/ML IJ SOLN
0.5000 mg | INTRAMUSCULAR | Status: DC | PRN
  Administered 2017-04-02 – 2017-04-05 (×10): 0.5 mg via INTRAVENOUS
  Filled 2017-04-02 (×10): qty 0.5

## 2017-04-02 MED ORDER — CIPROFLOXACIN IN D5W 400 MG/200ML IV SOLN
400.0000 mg | Freq: Two times a day (BID) | INTRAVENOUS | Status: DC
  Administered 2017-04-03 – 2017-04-05 (×5): 400 mg via INTRAVENOUS
  Filled 2017-04-02 (×6): qty 200

## 2017-04-02 MED ORDER — ONDANSETRON 4 MG PO TBDP: 4.0000 mg | ORAL_TABLET | Freq: Four times a day (QID) | ORAL | Status: DC | PRN

## 2017-04-02 MED ORDER — POLYETHYLENE GLYCOL 3350 17 G PO PACK: 17.0000 g | PACK | Freq: Every day | ORAL | Status: DC | PRN

## 2017-04-02 MED ORDER — HYDROMORPHONE HCL 1 MG/ML IJ SOLN
0.5000 mg | INTRAMUSCULAR | Status: DC | PRN
  Administered 2017-04-02: 0.5 mg via INTRAVENOUS

## 2017-04-02 MED ORDER — KETOROLAC TROMETHAMINE 30 MG/ML IJ SOLN
30.0000 mg | Freq: Four times a day (QID) | INTRAMUSCULAR | Status: DC
  Administered 2017-04-02: 30 mg via INTRAVENOUS

## 2017-04-02 MED ORDER — METRONIDAZOLE IN NACL 5-0.79 MG/ML-% IV SOLN
500.0000 mg | Freq: Three times a day (TID) | INTRAVENOUS | Status: DC
  Administered 2017-04-03 – 2017-04-05 (×7): 500 mg via INTRAVENOUS
  Filled 2017-04-02 (×9): qty 100

## 2017-04-02 MED ORDER — KETOROLAC TROMETHAMINE 30 MG/ML IJ SOLN
INTRAMUSCULAR | Status: AC
Start: 2017-04-02 — End: 2017-04-03
  Filled 2017-04-02: qty 1

## 2017-04-02 MED ORDER — HEPARIN SODIUM (PORCINE) 5000 UNIT/ML IJ SOLN: 5000.0000 [IU] | Freq: Three times a day (TID) | INTRAMUSCULAR | Status: DC

## 2017-04-02 MED ORDER — ONDANSETRON HCL 4 MG/2ML IJ SOLN
4.0000 mg | Freq: Four times a day (QID) | INTRAMUSCULAR | Status: DC | PRN
  Administered 2017-04-03 – 2017-04-05 (×7): 4 mg via INTRAVENOUS
  Filled 2017-04-02 (×6): qty 2

## 2017-04-02 MED ORDER — LACTATED RINGERS IV SOLN
125.0000 mL/h | INTRAVENOUS | Status: DC
  Administered 2017-04-02 – 2017-04-04 (×3): 125 mL/h via INTRAVENOUS

## 2017-04-02 MED ORDER — HYDROMORPHONE HCL 1 MG/ML IJ SOLN
INTRAMUSCULAR | Status: AC
  Filled 2017-04-02: qty 1

## 2017-04-02 MED ORDER — METRONIDAZOLE IN NACL 5-0.79 MG/ML-% IV SOLN
500.0000 mg | Freq: Once | INTRAVENOUS | Status: AC
  Administered 2017-04-02: 500 mg via INTRAVENOUS
  Filled 2017-04-02 (×2): qty 100

## 2017-04-02 MED ORDER — CIPROFLOXACIN IN D5W 400 MG/200ML IV SOLN
400.0000 mg | Freq: Once | INTRAVENOUS | Status: AC
  Administered 2017-04-02: 400 mg via INTRAVENOUS
  Filled 2017-04-02 (×2): qty 200

## 2017-04-02 MED ORDER — KETOROLAC TROMETHAMINE 30 MG/ML IJ SOLN
30.0000 mg | Freq: Four times a day (QID) | INTRAMUSCULAR | Status: DC
  Administered 2017-04-03 – 2017-04-05 (×6): 30 mg via INTRAVENOUS
  Filled 2017-04-02 (×6): qty 1

## 2017-04-02 MED ORDER — ONDANSETRON HCL 4 MG/2ML IJ SOLN: 4.0000 mg | Freq: Four times a day (QID) | INTRAMUSCULAR | Status: DC | PRN

## 2017-04-02 NOTE — Patient Instructions (Addendum)
We will send you from our office to the Registration Desk in the Medical Mall for Direct Admission. They will get you registered and take you to your room in the hospital. Please do not eat or drink anything until you speak with your nurse or Surgeon at the hospital.  Directions to Medical Mall: When leaving our office, go right. Go all of the way down to the very end of the hallway. You will have a purple wall in front of you. You will now have a tunnel to the hospital on your left hand side. Go through this tunnel and the elevators will be on your left. Go down to the 1st floor and take a slight left. The very first desk on the right hand side is the registration desk.  

## 2017-04-02 NOTE — Progress Notes (Signed)
04/02/2017  HPI: Patient presents today as follow-up for abdominal pain. She's been diagnosed with H. pylori gastritis and we ordered triple therapy regimen for her 2 weeks ago. She also has cholelithiasis as well as some recurrent incisional hernia. She presents today complaining of unbearable right upper quadrant pain as well as persistent nausea and vomiting. She reports that anything that she eats cancer pain and nausea although bland food and liquids go down a little bit better. If she eats anything spicy or heavy she immediately throws up. Her pain is mostly in the right upper quadrant and right side but she still has some mild pain related to her hernia. She was supposed to see Dr. Mechele CollinElliott yesterday for a recheck of her H. pylori, however she fell down the stairs while leaving her house yesterday and sprained her ankle and was seen at the emergency room yesterday.  Vital signs: BP 114/73   Pulse (!) 58   Temp 98.3 F (36.8 C) (Oral)   Ht 5\' 6"  (1.676 m)    Physical Exam: Constitutional: Appears uncomfortable and pain distress Abdomen:  Soft, obese, with tenderness to palpation the right upper quadrant which is worse compared to other areas. There is mild discomfort with palpation over the hernia site which is still reducible.  Assessment/Plan: 51 year old female with abdominal pain, with cholelithiasis, recurrent incisional hernia, as well as H. pylori which was recently treated.  -Currently the patient does not appear comfortable whatsoever and my concern is that she may have acute cholecystitis. Given her symptoms and her physical exam, discussed with the patient that we should admit her to the hospital today. We will start her on IV fluids and keep her nothing by mouth with appropriate pain and nausea medications. We will obtain a new ultrasound of her abdomen and most likely start her on IV antibiotics we will also get a new set of labs including CBC and CMP. She'll be added to the add-on  schedule for possible cholecystectomy tomorrow.   Howie IllJose Luis Minie Roadcap, MD [facility]

## 2017-04-02 NOTE — Progress Notes (Signed)
Patient a difficult IV stick, IV team consulted. Ultrasound notified that patient states that she had not had anything to eat since yesterday. Medication tech notified to reconcile patient medications. Patient is alert and oriented and ambulates with cane

## 2017-04-02 NOTE — H&P (Signed)
Date of Admission:  04/02/2017  Reason for Admission:  Symptomatic cholelithiasis  History of Present Illness: Amanda [name] is a 51 y.o. female known to the office with abdominal pain. She had been worked up recently for abdominal pain with Dr. Markham JordanElliot and had a colonoscopy and EGD, which noted gastritis and biopsy positive H Pylori.  She was treated with triple therapy and just recently finished her antibiotic course. She has also been followed in our office because of a recurrent incisional hernia which on CT scan appears there is a portion of fat herniating through the superior portion of the mesh insertion from years ago. Recently she was also seen in emergency room for abdominal pain and diagnosed with cholelithiasis.  She presented today to the office for further evaluation after her triple therapy course was completed. She reports that she has been miserable and pain with unbearable pain as well as nausea and vomiting. She reports that if she eats anything spicy she gets abdominal pain and vomiting. She is able to tolerate bland food and some liquids. However she still has pain particularly on the right side after eating. She does have some pain associate with her hernia but this has remained reducible throughout. Denies having any fevers but has had some chills. She was supposed to see Dr. Mechele CollinElliott yesterday but while leaving her house she slipped downstairs and sprained her ankle and went to the emergency room instead.  Past Medical History: Past Medical History:  Diagnosis Date  . Arthritis    knees, shoulder, upper back  . Asthma   . Diabetes mellitus without complication (HCC)   . Environmental allergies   . Fibromyalgia   . GERD (gastroesophageal reflux disease)   . Headache    migraines - 5x/mo  . Hypertension   . Migraines   . Motion sickness    ships  . Sleep apnea   . Thyroid nodule    bilateral and goiter  . Wears contact lenses   . Wears dentures    partial upper      Past Surgical History: Past Surgical History:  Procedure Laterality Date  . ABDOMINAL HYSTERECTOMY  2000's  . ABDOMINAL SURGERY     laparoscopy x4 with lysis of adhesions  . APPENDECTOMY  1986  . CESAREAN SECTION  1992  . COLONOSCOPY WITH PROPOFOL N/A 03/10/2017   Procedure: COLONOSCOPY WITH PROPOFOL;  Surgeon: Scot JunElliott, Robert T, MD;  [facility];  Service: Endoscopy;  Laterality: N/A;  . ECTOPIC PREGNANCY SURGERY  2000's  . ESOPHAGOGASTRODUODENOSCOPY (EGD) WITH PROPOFOL N/A 03/10/2017   Procedure: ESOPHAGOGASTRODUODENOSCOPY (EGD) WITH PROPOFOL;  Surgeon: Scot JunElliott, Robert T, MD;  Location: [facility] ENDOSCOPY;  Service: Endoscopy;  Laterality: N/A;  . HERNIA REPAIR    . JOINT REPLACEMENT Right 12/16/12   knee- medial - makoplasty  . KNEE ARTHROSCOPY Right 2006   partial medial and lateral meniscectomies  . KNEE ARTHROSCOPY Right 10/06/2015   Procedure: RIGHT KNEE ARTHROSCOPY WITH DEBRIDEMENT;  Surgeon: Erin SonsHarold Kernodle, MD;  Location[facility];  Service: Orthopedics;  Laterality: Right;  Diabetic - insulin and oral meds  . ROTATOR CUFF REPAIR Left 2006  . TUBAL LIGATION  2000's    Home Medications: Prior to Admission medications   Medication Sig Start Date End Date Taking? Authorizing Provider  ACCU-CHEK FASTCLIX LANCETS MISC by Does not apply route as directed.    [provider]  albuterol (PROVENTIL HFA) 108 (90 Base) MCG/ACT inhaler Inhale 2 puffs into the lungs every 4 (four) hours  as needed for wheezing or shortness of breath. 06/05/16   Shane Crutch, MD  ALPRAZolam Prudy Feeler) 1 MG tablet Take 1 mg by mouth at bedtime as needed for anxiety or sleep.    [provider]  ARIPiprazole (ABILIFY) 10 MG tablet Take 10 mg by mouth daily as needed.    [provider]  Ascorbic Acid (VITAMIN C) 1000 MG tablet Take 1,000 mg by mouth daily.    [provider]  BIOTIN PO Take by mouth daily.    [provider]  canagliflozin  (INVOKANA) 300 MG TABS tablet Take 300 mg by mouth daily before breakfast.    [provider]  cycloSPORINE (RESTASIS) 0.05 % ophthalmic emulsion 1 drop 2 (two) times daily.    [provider]  dexmethylphenidate (FOCALIN) 10 MG tablet Take 10 mg by mouth 2 (two) times daily. 08/16/16   [provider]  Diclofenac Sodium 3 % GEL Place 1 application onto the skin every 12 (twelve) hours as needed. 10/08/16   Schaevitz, Myra Rude, MD  diltiazem (DILACOR XR) 240 MG 24 hr capsule Take 240 mg by mouth daily.    [provider]  DULoxetine (CYMBALTA) 60 MG capsule Take 60 mg by mouth daily. AM    [provider]  flunisolide (NASALIDE) 25 MCG/ACT (0.025%) SOLN Place 2 sprays into the nose daily as needed.    [provider]  fluticasone furoate-vilanterol (BREO ELLIPTA) 200-25 MCG/INH AEPB Inhale 1 puff into the lungs daily. 06/05/16   Shane Crutch, MD  furosemide (LASIX) 10 MG/ML solution Take 20 mg by mouth daily.    [provider]  glucosamine-chondroitin 500-400 MG tablet Take 1 tablet by mouth 3 (three) times daily.    [provider]  glucose blood (ACCU-CHEK SMARTVIEW) test strip 1 each by Other route as needed for other. Use as instructed    [provider]  HYDROcodone-acetaminophen (NORCO/VICODIN) 5-325 MG tablet Take 1 tablet by mouth every 6 (six) hours as needed for moderate pain. 04/01/17   Tommi Rumps, PA-C  ibuprofen (ADVIL,MOTRIN) 600 MG tablet Take 1 tablet (600 mg total) by mouth every 8 (eight) hours as needed. 09/27/16   Joni Reining, PA-C  insulin detemir (LEVEMIR) 100 UNIT/ML injection Inject 10 Units into the skin at bedtime.    [provider]  ipratropium-albuterol (DUONEB) 0.5-2.5 (3) MG/3ML SOLN Take 3 mLs by nebulization every 4 (four) hours as needed.    [provider]  isosorbide mononitrate (IMDUR) 30 MG 24 hr tablet Take 30 mg by mouth daily.    [provider]  LINZESS 290 MCG CAPS capsule Take 1 tablet by mouth daily. 02/28/17   [provider]  losartan (COZAAR) 25 MG tablet Take 25 mg by mouth daily.    [provider]  meloxicam (MOBIC) 15 MG tablet Take 15 mg by mouth daily.    [provider]  metFORMIN (GLUCOPHAGE) 500 MG tablet Take 1,000 mg by mouth 2 (two) times daily with a meal.     [provider]  metoprolol succinate (TOPROL-XL) 50 MG 24 hr tablet Take 50 mg by mouth daily. Take with or immediately following a meal.    [provider]  mirtazapine (REMERON) 30 MG tablet Take 30 mg by mouth at bedtime.    [provider]  montelukast (SINGULAIR) 10 MG tablet Take 1 tablet (10 mg total) by mouth daily. 06/05/16 06/05/17  Shane Crutch, MD  Multiple Vitamin (MULTIVITAMIN) tablet Take 1  tablet by mouth daily.    [provider]  omeprazole (PRILOSEC) 40 MG capsule Take 40 mg by mouth daily. 1 hr before supper    [provider]  ondansetron (ZOFRAN ODT) 4 MG disintegrating tablet Take 1 tablet (4 mg total) by mouth every 8 (eight) hours as needed for nausea or vomiting. 04/01/17   Tommi Rumps, PA-C  pregabalin (LYRICA) 200 MG capsule Take 200 mg by mouth 2 (two) times daily.    [provider]  sucralfate (CARAFATE) 1 g tablet Take 1 g by mouth 4 (four) times daily -  with meals and at bedtime.    [provider]  SUMAtriptan (IMITREX) 25 MG tablet Take 25 mg by mouth every 2 (two) hours as needed for migraine. May repeat in 2 hours if headache persists or recurs.    [provider]  tiZANidine (ZANAFLEX) 4 MG tablet Take 4 mg by mouth every 6 (six) hours as needed for muscle spasms.    [provider]  zolpidem (AMBIEN) 5 MG tablet Take 5 mg by mouth at bedtime as needed for sleep.    [provider]    Allergies: Allergies  Allergen Reactions  . Penicillins Anaphylaxis    Has patient had a PCN  reaction causing immediate rash, facial/tongue/throat swelling, SOB or lightheadedness with hypotension: Yes Has patient had a PCN reaction causing severe rash involving mucus membranes or skin necrosis: No Has patient had a PCN reaction that required hospitalization No Has patient had a PCN reaction occurring within the last 10 years: No If all of the above answers are "NO", then may proceed with Cephalosporin use.   . Ace Inhibitors Swelling    Social History:  reports that she has never smoked. She has never used smokeless tobacco. She reports that she does not drink alcohol or use drugs.   Family History: Family History  Problem Relation Age of Onset  . Hypertension Mother   . Hypertension Father   . Diabetes Father   . Allergies Father     Review of Systems: Review of Systems  Constitutional: Positive for chills. Negative for fever.  HENT: Negative for hearing loss.   Eyes: Negative for blurred vision.  Respiratory: Negative for shortness of breath.   Cardiovascular: Negative for chest pain.  Gastrointestinal: Positive for abdominal pain, nausea and vomiting. Negative for constipation and diarrhea.  Genitourinary: Negative for dysuria.  Musculoskeletal: Negative for myalgias.  Skin: Negative for rash.  Neurological: Negative for dizziness.  Psychiatric/Behavioral: Negative for depression.  All other systems reviewed and are negative.   Physical Exam BP 114/73   Pulse (!) 58   Temp 98.3 F (36.8 C) (Oral)   Ht 5\' 6"  (1.676 m)   CONSTITUTIONAL: Appears uncomfortable and pain distress HEENT:  Normocephalic, atraumatic, extraocular motion intact. NECK: Trachea is midline, and there is no jugular venous distension.  RESPIRATORY:  Lungs are clear, and breath sounds are equal bilaterally. Normal respiratory effort without pathologic use of accessory muscles. CARDIOVASCULAR: Heart is regular without murmurs, gallops, or rubs. GI: Soft, obese, with tenderness to palpation  the right upper quadrant which is worse compared to other areas. There is mild discomfort with palpation over the hernia site which is still reducible MUSCULOSKELETAL:  Normal muscle strength and tone in all four extremities.  No peripheral edema or cyanosis. SKIN: Skin turgor is normal. There are no pathologic skin lesions.  NEUROLOGIC:  Motor and sensation is grossly normal.  Cranial nerves are grossly intact.  PSYCH:  Alert and oriented to person, place and time. Affect is normal.  Laboratory Analysis: CBC, CMP to be ordered on admission  Imaging: Ultrasound to be ordered on admission  Assessment and Plan: This is a 51 y.o. female who presents with persistent abdominal pain particularly on the right side social with nausea and vomiting. Based on her symptoms my concern that she has acute cholecystitis versus unrelenting symptomatic cholelithiasis. I discussed with the patient that it would be best and we admitted her to the hospital for further evaluation and possible surgical management. At this point she will be admitted and will have labs ordered including a CBC and CMP as well as an ultrasound to evaluate for cholecystitis. She will be getting clear liquids and then be nothing by mouth after midnight for possible cholecystectomy tomorrow. She'll have IV fluids and appropriate pain and nausea control. She'll be empirically on antibiotics for cholecystitis. She understands this plan and all of her questions have been answered.   Howie IllJose Luis Berdell Nevitt, MD [facility]

## 2017-04-02 NOTE — Telephone Encounter (Signed)
Spoke with Dawn at [facility] and informed her that we will be sending the patient over. She stated that I will receive a call once a bed is available for the patient and to have the patient stay put until then.

## 2017-04-03 ENCOUNTER — Encounter
Admission: AD | Disposition: A | Discharge status: Home / Self Care | Payer: Self-pay | Source: Ambulatory Visit | Attending: Surgery

## 2017-04-03 ENCOUNTER — Inpatient Hospital Stay: Payer: Medicare Other | Admitting: Anesthesiology

## 2017-04-03 ENCOUNTER — Encounter: Payer: Self-pay | Admitting: *Deleted

## 2017-04-03 ENCOUNTER — Ambulatory Visit: Admission: RE | Admit: 2017-04-03 | Payer: Medicare Other | Source: Ambulatory Visit | Admitting: Surgery

## 2017-04-03 DIAGNOSIS — K8020 Calculus of gallbladder without cholecystitis without obstruction: Secondary | ICD-10-CM

## 2017-04-03 HISTORY — PX: CHOLECYSTECTOMY: SHX55

## 2017-04-03 LAB — GLUCOSE, CAPILLARY
Glucose-Capillary: 138 mg/dL — ABNORMAL HIGH (ref 65–99)
Glucose-Capillary: 177 mg/dL — ABNORMAL HIGH (ref 65–99)

## 2017-04-03 SURGERY — LAPAROSCOPIC CHOLECYSTECTOMY
Anesthesia: General | Wound class: Clean Contaminated

## 2017-04-03 MED ORDER — INSULIN ASPART 100 UNIT/ML ~~LOC~~ SOLN
0.0000 [IU] | Freq: Three times a day (TID) | SUBCUTANEOUS | Status: DC
  Administered 2017-04-04 (×3): 3 [IU] via SUBCUTANEOUS
  Administered 2017-04-05: 4 [IU] via SUBCUTANEOUS
  Filled 2017-04-03 (×4): qty 1

## 2017-04-03 MED ORDER — DULOXETINE HCL 30 MG PO CPEP
60.0000 mg | ORAL_CAPSULE | Freq: Every day | ORAL | Status: DC
  Administered 2017-04-03 – 2017-04-05 (×3): 60 mg via ORAL
  Filled 2017-04-03 (×3): qty 2

## 2017-04-03 MED ORDER — HYDROMORPHONE HCL 1 MG/ML IJ SOLN
INTRAMUSCULAR | Status: AC
  Filled 2017-04-03: qty 1

## 2017-04-03 MED ORDER — INSULIN ASPART 100 UNIT/ML ~~LOC~~ SOLN: 0.0000 [IU] | Freq: Every day | SUBCUTANEOUS | Status: DC

## 2017-04-03 MED ORDER — ACETAMINOPHEN 10 MG/ML IV SOLN
INTRAVENOUS | Status: DC | PRN
  Administered 2017-04-03: 1000 mg via INTRAVENOUS

## 2017-04-03 MED ORDER — PHENYLEPHRINE HCL 10 MG/ML IJ SOLN
INTRAMUSCULAR | Status: DC | PRN
  Administered 2017-04-03: 100 ug via INTRAVENOUS

## 2017-04-03 MED ORDER — FENTANYL CITRATE (PF) 100 MCG/2ML IJ SOLN
INTRAMUSCULAR | Status: AC
  Filled 2017-04-03: qty 4

## 2017-04-03 MED ORDER — FENTANYL CITRATE (PF) 100 MCG/2ML IJ SOLN
25.0000 ug | INTRAMUSCULAR | Status: DC | PRN
  Administered 2017-04-03 (×4): 25 ug via INTRAVENOUS

## 2017-04-03 MED ORDER — EPINEPHRINE PF 1 MG/ML IJ SOLN
INTRAMUSCULAR | Status: AC
  Filled 2017-04-03: qty 1

## 2017-04-03 MED ORDER — BUPIVACAINE-EPINEPHRINE (PF) 0.25% -1:200000 IJ SOLN
INTRAMUSCULAR | Status: DC | PRN
  Administered 2017-04-03: 30 mL via PERINEURAL

## 2017-04-03 MED ORDER — ONDANSETRON HCL 4 MG/2ML IJ SOLN: 4.0000 mg | Freq: Once | INTRAMUSCULAR | Status: DC | PRN

## 2017-04-03 MED ORDER — METOPROLOL SUCCINATE ER 50 MG PO TB24
50.0000 mg | ORAL_TABLET | Freq: Every day | ORAL | Status: DC
  Administered 2017-04-03 – 2017-04-04 (×2): 50 mg via ORAL
  Filled 2017-04-03 (×3): qty 1

## 2017-04-03 MED ORDER — DEXMETHYLPHENIDATE HCL 5 MG PO TABS
10.0000 mg | ORAL_TABLET | Freq: Two times a day (BID) | ORAL | Status: DC
  Administered 2017-04-04 – 2017-04-05 (×2): 10 mg via ORAL
  Filled 2017-04-03 (×3): qty 2

## 2017-04-03 MED ORDER — CLINDAMYCIN PHOSPHATE 600 MG/50ML IV SOLN
INTRAVENOUS | Status: AC
  Filled 2017-04-03: qty 50

## 2017-04-03 MED ORDER — ISOSORBIDE MONONITRATE ER 30 MG PO TB24
30.0000 mg | ORAL_TABLET | Freq: Every day | ORAL | Status: DC
  Administered 2017-04-03 – 2017-04-05 (×3): 30 mg via ORAL
  Filled 2017-04-03 (×3): qty 1

## 2017-04-03 MED ORDER — MIDAZOLAM HCL 2 MG/2ML IJ SOLN
INTRAMUSCULAR | Status: AC
  Filled 2017-04-03: qty 2

## 2017-04-03 MED ORDER — PREGABALIN 75 MG PO CAPS
150.0000 mg | ORAL_CAPSULE | Freq: Three times a day (TID) | ORAL | Status: DC
  Administered 2017-04-03 – 2017-04-05 (×5): 150 mg via ORAL
  Filled 2017-04-03 (×5): qty 2

## 2017-04-03 MED ORDER — MONTELUKAST SODIUM 10 MG PO TABS
10.0000 mg | ORAL_TABLET | Freq: Every day | ORAL | Status: DC
  Administered 2017-04-03 – 2017-04-05 (×3): 10 mg via ORAL
  Filled 2017-04-03 (×3): qty 1

## 2017-04-03 MED ORDER — IPRATROPIUM-ALBUTEROL 0.5-2.5 (3) MG/3ML IN SOLN: 3.0000 mL | RESPIRATORY_TRACT | Status: DC | PRN

## 2017-04-03 MED ORDER — CLINDAMYCIN PHOSPHATE 600 MG/50ML IV SOLN
INTRAVENOUS | Status: DC | PRN
  Administered 2017-04-03: 600 mg via INTRAVENOUS

## 2017-04-03 MED ORDER — DILTIAZEM HCL ER 240 MG PO CP24
240.0000 mg | ORAL_CAPSULE | Freq: Every day | ORAL | Status: DC
  Administered 2017-04-03 – 2017-04-05 (×3): 240 mg via ORAL
  Filled 2017-04-03: qty 2
  Filled 2017-04-03 (×2): qty 1
  Filled 2017-04-03 (×2): qty 2
  Filled 2017-04-03: qty 1

## 2017-04-03 MED ORDER — ALBUTEROL SULFATE (2.5 MG/3ML) 0.083% IN NEBU: 2.5000 mg | INHALATION_SOLUTION | RESPIRATORY_TRACT | Status: DC | PRN

## 2017-04-03 MED ORDER — FENTANYL CITRATE (PF) 100 MCG/2ML IJ SOLN
INTRAMUSCULAR | Status: AC
  Administered 2017-04-03: 25 ug via INTRAVENOUS
  Filled 2017-04-03: qty 2

## 2017-04-03 MED ORDER — DEXAMETHASONE SODIUM PHOSPHATE 10 MG/ML IJ SOLN
INTRAMUSCULAR | Status: DC | PRN
  Administered 2017-04-03: 10 mg via INTRAVENOUS

## 2017-04-03 MED ORDER — LOSARTAN POTASSIUM 25 MG PO TABS
25.0000 mg | ORAL_TABLET | Freq: Every day | ORAL | Status: DC
  Administered 2017-04-03 – 2017-04-05 (×3): 25 mg via ORAL
  Filled 2017-04-03 (×3): qty 1

## 2017-04-03 MED ORDER — LINACLOTIDE 290 MCG PO CAPS
290.0000 ug | ORAL_CAPSULE | Freq: Every day | ORAL | Status: DC
  Administered 2017-04-03 – 2017-04-05 (×3): 290 ug via ORAL
  Filled 2017-04-03 (×3): qty 1

## 2017-04-03 MED ORDER — ONDANSETRON HCL 4 MG/2ML IJ SOLN
INTRAMUSCULAR | Status: AC
  Filled 2017-04-03: qty 2

## 2017-04-03 MED ORDER — LIDOCAINE HCL (PF) 2 % IJ SOLN
INTRAMUSCULAR | Status: AC
  Filled 2017-04-03: qty 2

## 2017-04-03 MED ORDER — FLUTICASONE FUROATE-VILANTEROL 200-25 MCG/INH IN AEPB
1.0000 | INHALATION_SPRAY | Freq: Every day | RESPIRATORY_TRACT | Status: DC
  Administered 2017-04-03 – 2017-04-05 (×3): 1 via RESPIRATORY_TRACT
  Filled 2017-04-03: qty 28

## 2017-04-03 MED ORDER — FENTANYL CITRATE (PF) 100 MCG/2ML IJ SOLN
INTRAMUSCULAR | Status: DC | PRN
  Administered 2017-04-03 (×4): 50 ug via INTRAVENOUS

## 2017-04-03 MED ORDER — BUPIVACAINE HCL (PF) 0.25 % IJ SOLN
INTRAMUSCULAR | Status: AC
  Filled 2017-04-03: qty 30

## 2017-04-03 MED ORDER — MIDAZOLAM HCL 2 MG/2ML IJ SOLN
INTRAMUSCULAR | Status: DC | PRN
  Administered 2017-04-03: 2 mg via INTRAVENOUS

## 2017-04-03 MED ORDER — OXYCODONE HCL 5 MG PO TABS
5.0000 mg | ORAL_TABLET | ORAL | Status: DC | PRN
  Administered 2017-04-03: 5 mg via ORAL
  Filled 2017-04-03: qty 1

## 2017-04-03 MED ORDER — PROPOFOL 10 MG/ML IV BOLUS
INTRAVENOUS | Status: DC | PRN
  Administered 2017-04-03: 150 mg via INTRAVENOUS

## 2017-04-03 MED ORDER — ROCURONIUM BROMIDE 50 MG/5ML IV SOLN
INTRAVENOUS | Status: AC
  Filled 2017-04-03: qty 1

## 2017-04-03 MED ORDER — GLYCOPYRROLATE 0.2 MG/ML IJ SOLN
INTRAMUSCULAR | Status: DC | PRN
  Administered 2017-04-03: .2 mg via INTRAVENOUS

## 2017-04-03 MED ORDER — LACTATED RINGERS IV SOLN
INTRAVENOUS | Status: DC | PRN
  Administered 2017-04-03: 15:00:00 via INTRAVENOUS

## 2017-04-03 MED ORDER — CYCLOSPORINE 0.05 % OP EMUL
1.0000 [drp] | Freq: Two times a day (BID) | OPHTHALMIC | Status: DC
  Administered 2017-04-03 – 2017-04-05 (×4): 1 [drp] via OPHTHALMIC
  Filled 2017-04-03 (×5): qty 1

## 2017-04-03 MED ORDER — SUGAMMADEX SODIUM 200 MG/2ML IV SOLN
INTRAVENOUS | Status: DC | PRN
  Administered 2017-04-03: 200 mg via INTRAVENOUS

## 2017-04-03 MED ORDER — SUCCINYLCHOLINE CHLORIDE 20 MG/ML IJ SOLN
INTRAMUSCULAR | Status: DC | PRN
  Administered 2017-04-03: 100 mg via INTRAVENOUS

## 2017-04-03 MED ORDER — ROCURONIUM BROMIDE 100 MG/10ML IV SOLN
INTRAVENOUS | Status: DC | PRN
  Administered 2017-04-03: 20 mg via INTRAVENOUS
  Administered 2017-04-03: 50 mg via INTRAVENOUS

## 2017-04-03 MED ORDER — TIZANIDINE HCL 4 MG PO TABS
4.0000 mg | ORAL_TABLET | Freq: Four times a day (QID) | ORAL | Status: DC | PRN
  Filled 2017-04-03: qty 1

## 2017-04-03 MED ORDER — LIDOCAINE HCL (CARDIAC) 20 MG/ML IV SOLN
INTRAVENOUS | Status: DC | PRN
  Administered 2017-04-03: 100 mg via INTRAVENOUS

## 2017-04-03 SURGICAL SUPPLY — 48 items
ADH SKN CLS APL DERMABOND .7 (GAUZE/BANDAGES/DRESSINGS) ×1
APPLIER CLIP 5 13 M/L LIGAMAX5 (MISCELLANEOUS) ×3
APR CLP MED LRG 5 ANG JAW (MISCELLANEOUS) ×1
BAG SPEC RTRVL LRG 6X4 10 (ENDOMECHANICALS) ×1
BLADE SURG 15 STRL LF DISP TIS (BLADE) ×1 IMPLANT
BLADE SURG 15 STRL SS (BLADE) ×3
CANISTER SUCT 1200ML W/VALVE (MISCELLANEOUS) ×3 IMPLANT
CATH CHOLANGI 4FR 420404F (CATHETERS) IMPLANT
CHLORAPREP W/TINT 26ML (MISCELLANEOUS) ×3 IMPLANT
CLIP APPLIE 5 13 M/L LIGAMAX5 (MISCELLANEOUS) ×1 IMPLANT
CONRAY 60ML FOR OR (MISCELLANEOUS) ×1 IMPLANT
DERMABOND ADVANCED (GAUZE/BANDAGES/DRESSINGS) ×2
DERMABOND ADVANCED .7 DNX12 (GAUZE/BANDAGES/DRESSINGS) ×1 IMPLANT
DRAPE C-ARM XRAY 36X54 (DRAPES) IMPLANT
ELECT REM PT RETURN 9FT ADLT (ELECTROSURGICAL) ×3
ELECTRODE REM PT RTRN 9FT ADLT (ELECTROSURGICAL) ×1 IMPLANT
FILTER LAP SMOKE EVAC STRL (MISCELLANEOUS) ×3 IMPLANT
GLOVE SURG SYN 7.0 (GLOVE) ×6 IMPLANT
GLOVE SURG SYN 7.0 PF PI (GLOVE) ×1 IMPLANT
GLOVE SURG SYN 7.5  E (GLOVE) ×4
GLOVE SURG SYN 7.5 E (GLOVE) ×2 IMPLANT
GLOVE SURG SYN 7.5 PF PI (GLOVE) ×1 IMPLANT
GOWN STRL REUS W/ TWL LRG LVL3 (GOWN DISPOSABLE) ×3 IMPLANT
GOWN STRL REUS W/TWL LRG LVL3 (GOWN DISPOSABLE) ×9
IRRIGATION STRYKERFLOW (MISCELLANEOUS) IMPLANT
IRRIGATOR STRYKERFLOW (MISCELLANEOUS) ×3
IV CATH ANGIO 12GX3 LT BLUE (NEEDLE) ×1 IMPLANT
IV NS 1000ML (IV SOLUTION) ×3
IV NS 1000ML BAXH (IV SOLUTION) IMPLANT
JACKSON PRATT 10 (INSTRUMENTS) IMPLANT
L-HOOK LAP DISP 36CM (ELECTROSURGICAL) ×3
LABEL OR SOLS (LABEL) ×3 IMPLANT
LHOOK LAP DISP 36CM (ELECTROSURGICAL) ×1 IMPLANT
NDL SAFETY 22GX1.5 (NEEDLE) ×3 IMPLANT
NEEDLE HYPO 22GX1.5 SAFETY (NEEDLE) ×3 IMPLANT
PACK LAP CHOLECYSTECTOMY (MISCELLANEOUS) ×3 IMPLANT
PENCIL ELECTRO HAND CTR (MISCELLANEOUS) ×3 IMPLANT
POUCH SPECIMEN RETRIEVAL 10MM (ENDOMECHANICALS) ×3 IMPLANT
SCISSORS METZENBAUM CVD 33 (INSTRUMENTS) ×3 IMPLANT
SLEEVE ADV FIXATION 5X100MM (TROCAR) ×9 IMPLANT
SPONGE VERSALON 4X4 4PLY (MISCELLANEOUS) IMPLANT
SUT MNCRL 4-0 (SUTURE) ×3
SUT MNCRL 4-0 27XMFL (SUTURE) ×1
SUT VICRYL 0 AB UR-6 (SUTURE) ×5 IMPLANT
SUTURE MNCRL 4-0 27XMF (SUTURE) ×1 IMPLANT
TROCAR 130MM GELPORT  DAV (MISCELLANEOUS) ×3 IMPLANT
TROCAR Z-THREAD OPTICAL 5X100M (TROCAR) ×3 IMPLANT
TUBING INSUFFLATOR HI FLOW (MISCELLANEOUS) ×3 IMPLANT

## 2017-04-03 NOTE — Progress Notes (Signed)
04/03/2017  Subjective: No acute events overnight. Patient still having right upper quadrant pain but is better controlled. Currently nothing by mouth for cholecystectomy later today.  Vital signs: Temp:  [97.7 F (36.5 C)-98.3 F (36.8 C)] 97.7 F (36.5 C) (08/02 0536) Pulse Rate:  [50-58] 56 (08/02 0536) Resp:  [16-18] 16 (08/02 0536) BP: (102-135)/(55-73) 102/55 (08/02 0536) SpO2:  [94 %-99 %] 94 % (08/02 0536) Weight:  [87.3 kg (192 lb 6.4 oz)] 87.3 kg (192 lb 6.4 oz) (08/01 1642)   Intake/Output: 08/01 0701 - 08/02 0700 In: -  Out: 2400 [Urine:2400] Last BM Date: 04/01/17  Physical Exam: Constitutional: No acute distress Abdomen:  Soft, obese, nondistended, with tenderness to palpation in the right upper quadrant.  Labs:   Recent Labs  04/02/17 1906  WBC 3.8  HGB 12.9  HCT 39.0  PLT 235    Recent Labs  04/02/17 1906  NA 139  K 3.8  CL 104  CO2 28  GLUCOSE 149*  BUN 11  CREATININE 0.87  CALCIUM 9.5   No results for input(s): LABPROT, INR in the last 72 hours.  Imaging: [place]as Abdomen Limited Ruq  Result Date: 04/02/2017 CLINICAL DATA:  51 year old female with a history of cholelithiasis and right upper quadrant pain EXAM: ULTRASOUND ABDOMEN LIMITED RIGHT UPPER QUADRANT COMPARISON:  None. FINDINGS: Gallbladder: Wall echo shadow complex, compatible with cholelithiasis. Sonographic Eulah PontMurphy sign is negative. Common bile duct: Diameter: 4 mm Liver: No focal lesion identified. Within normal limits in parenchymal echogenicity. IMPRESSION: Cholelithiasis, without sonographic evidence of acute cholecystitis. Electronically Signed   By: Gilmer MorJaime  Wagner D.O.   On: 04/02/2017 18:24    Assessment/Plan: 51 year old female with acute cholecystitis.  -I have independently viewed the patient's ultrasound and I do think that there is gallbladder wall thickening to 5 mm with mild pericholecystic fluid around the gallbladder. She is tender to palpation right upper quadrant in I do  believe clinically that she has acute cholecystitis. She will go to the operating room today for cholecystectomy. All of her questions have been answered.   Howie IllJose Luis Joselle Deeds, MD [facility]

## 2017-04-03 NOTE — Anesthesia Preprocedure Evaluation (Signed)
Anesthesia Evaluation  Patient identified by MRN, date of birth, ID band Patient awake    Reviewed: Allergy & Precautions, NPO status   History of Anesthesia Complications (+) Emergence Delirium  Airway Mallampati: III  TM Distance: >3 FB     Dental  (+) Upper Dentures, Poor Dentition   Pulmonary asthma , sleep apnea ,     + decreased breath sounds      Cardiovascular Exercise Tolerance: Good hypertension, Pt. on medications and Pt. on home beta blockers  Rhythm:Regular Rate:Normal     Neuro/Psych  Headaches, Depression    GI/Hepatic Neg liver ROS, GERD  Medicated,  Endo/Other  diabetes, Type 1, Insulin DependentMorbid obesity  Renal/GU      Musculoskeletal  (+) Fibromyalgia -  Abdominal (+) + obese,   Peds  Hematology   Anesthesia Other Findings   Reproductive/Obstetrics                             Anesthesia Physical Anesthesia Plan  ASA: III  Anesthesia Plan: General   Post-op Pain Management:    Induction: Intravenous  PONV Risk Score and Plan: 1 and Ondansetron and Dexamethasone  Airway Management Planned: Oral ETT  Additional Equipment:   Intra-op Plan:   Post-operative Plan: Extubation in OR  Informed Consent: I have reviewed the patients History and Physical, chart, labs and discussed the procedure including the risks, benefits and alternatives for the proposed anesthesia with the patient or authorized representative who has indicated his/her understanding and acceptance.     Plan Discussed with: CRNA  Anesthesia Plan Comments:         Anesthesia Quick Evaluation

## 2017-04-03 NOTE — Anesthesia Post-op Follow-up Note (Cosign Needed)
Anesthesia QCDR form completed.        

## 2017-04-03 NOTE — Progress Notes (Signed)
Initial Nutrition Assessment  DOCUMENTATION CODES:   Obesity unspecified  INTERVENTION:   RD will order supplements when diet advanced  NUTRITION DIAGNOSIS:   Inadequate oral intake related to acute illness, cholecystitis as evidenced by NPO status  GOAL:   Patient will meet greater than or equal to 90% of their needs  MONITOR:   Diet advancement, Labs, Weight trends  REASON FOR ASSESSMENT:   Malnutrition Screening Tool    ASSESSMENT:   51 year old female with abdominal pain, with cholelithiasis, recurrent incisional hernia, as well as H. pylori which was recently treated. Admitted for acute cholecystitis    Met with pt in room today. Pt reports decreased appetite and oral intake for three weeks pta r/t chronic abdominal pain, nausea, and diarrhea. Per chart, pt is weight stable. Pt currently NPO for scheduled cholecystectomy today. RD will order supplements when diet advanced.    Medications reviewed and include: heparin, protonix, ciprofloxacin, metronidazole, hydromorphone, zofran  Labs reviewed  Nutrition-Focused physical exam completed. Findings are no fat depletion, no muscle depletion, and no edema.   Diet Order:  Diet NPO time specified  Skin:  Reviewed, no issues  Last BM:  7/31  Height:   Ht Readings from Last 1 Encounters:  04/02/17 5' 5"  (1.651 m)    Weight:   Wt Readings from Last 1 Encounters:  04/02/17 192 lb 6.4 oz (87.3 kg)    Ideal Body Weight:  56.8 kg  BMI:  Body mass index is 32.02 kg/m.  Estimated Nutritional Needs:   Kcal:  1600-1800kcal/day   Protein:  87-104g/day   Fluid:  >1.6L/day   EDUCATION NEEDS:   No education needs identified at this time  Koleen Distance MS, RD, [place] Pager #[phone] After Hours Pager: [phone]

## 2017-04-03 NOTE — Op Note (Addendum)
  Procedure Date:  04/03/2017  Pre-operative Diagnosis:  Acute cholecystitis  Post-operative Diagnosis:  Acute on chronic cholecystitis  Procedure:  Laparoscopic cholecystectomy and incisional hernia repair  Surgeon:  Howie IllJose Luis Cailean Heacock, MD  Anesthesia:  General endotracheal  Estimated Blood Loss:  10 ml  Specimens:  gallbladder  Complications:  None  Indications for Procedure:  This is a 51 y.o. female who presents with abdominal pain and workup revealing acute cholecystitis.  The benefits, complications, treatment options, and expected outcomes were discussed with the patient. The risks of bleeding, infection, recurrence of symptoms, failure to resolve symptoms, bile duct damage, bile duct leak, retained common bile duct stone, bowel injury, and need for further procedures were all discussed with the patient and she was willing to proceed.  Description of Procedure: The patient was correctly identified in the preoperative area and brought into the operating room.  The patient was placed supine with VTE prophylaxis in place.  Appropriate time-outs were performed.  Anesthesia was induced and the patient was intubated.  Appropriate antibiotics were infused.  The abdomen was prepped and draped in a sterile fashion. A supraumbilical incision was made at the site of her current incisional hernia. A cutdown technique was used to enter the abdominal cavity without injury via the hernia defect, and a Hasson trocar was inserted.  Pneumoperitoneum was obtained but on camera placement, there were significant adhesions of the colon and omentum to the abdominal wall.   A 5 mm right lateral port was placed using the camera through the trocar for visualization.  Then an additional 5 mm right port and a 5 mm subxiphoid port were placed.  Lysis of adhesions were done in order to facilitate the Hasson trocar to go through the adhesions.  The gallbladder was identified.  The gallbladder was shriveled and very  hard and could not be grasped easily.  The fundus was pushed up and retracted cephalad.  Adhesions were lysed bluntly and with electrocautery. The infundibulum could also not be grasped and thus a tenaculum was used to grab the fundus of the gallbladder and a top-down approach was used to dissect the gallbladder off the liver bed.  Upon reaching the neck of the gallbladder, careful dissection was done and the cystic duct and artery were both identified.  Both were clipped twice proximally and once distally, cutting in between.  The gallbladder was placed in an Endocatch bag and brought out via the umbilical incision. The liver bed was inspected and any bleeding was controlled with electrocautery. The right upper quadrant was then inspected again revealing intact clips, no bleeding, and no ductal injury.  The area was thoroughly irrigated.  The 5 mm ports were removed under direct visualization and the Hasson trocar was removed.  The hernia defect was closed using multiple 0 vicryl sutures.  Local anesthetic was infused in all incisions and the incisions were closed with 4-0 Monocryl.  The wounds were cleaned and sealed with DermaBond.  The patient was emerged from anesthesia and extubated and brought to the recovery room for further management.  The patient tolerated the procedure well and all counts were correct at the end of the case.   Howie IllJose Luis Tushar Enns, MD

## 2017-04-03 NOTE — OR Nursing (Signed)
This RN present in room during education of patient regarding her dermal piercings x 13 in her body.  Patient states they are all unable to be removed without surgical intervention; patient educated about the risk of possible burn to area of dermal piercing; patient in agreement to continue surgery despite presence of dermal piercings.  Erlinda HongKristin Wiggins present in room from OR.

## 2017-04-03 NOTE — Anesthesia Postprocedure Evaluation (Signed)
Anesthesia Post Note  Patient: Barnabas HarriesLeomia W Walker  Procedure(s) Performed: Procedure(s) (LRB): LAPAROSCOPIC CHOLECYSTECTOMY (N/A)  Patient location during evaluation: PACU Anesthesia Type: General Level of consciousness: awake and alert Pain management: pain level controlled Vital Signs Assessment: post-procedure vital signs reviewed and stable Respiratory status: spontaneous breathing, nonlabored ventilation, respiratory function stable and patient connected to nasal cannula oxygen Cardiovascular status: blood pressure returned to baseline and stable Postop Assessment: no signs of nausea or vomiting Anesthetic complications: no     Last Vitals:  Vitals:   04/03/17 1815 04/03/17 1824  BP: (!) 141/86 (!) 146/89  Pulse: 77 86  Resp: (!) 22 18  Temp:  (!) 36.2 C    Last Pain:  Vitals:   04/03/17 1824  TempSrc:   PainSc: 4                  Cleda MccreedyJoseph K Piscitello

## 2017-04-03 NOTE — Anesthesia Procedure Notes (Signed)
Procedure Name: Intubation Date/Time: 04/03/2017 3:28 PM Performed by: Nelda Marseille Pre-anesthesia Checklist: Patient identified, Patient being monitored, Timeout performed, Emergency Drugs available and Suction available Patient Re-evaluated:Patient Re-evaluated prior to induction Oxygen Delivery Method: Circle system utilized Preoxygenation: Pre-oxygenation with 100% oxygen Induction Type: IV induction Ventilation: Mask ventilation without difficulty Laryngoscope Size: Mac and 3 Grade View: Grade II Tube type: Oral Tube size: 7.0 mm Number of attempts: 1 Airway Equipment and Method: Stylet Placement Confirmation: ETT inserted through vocal cords under direct vision,  positive ETCO2 and breath sounds checked- equal and bilateral Secured at: 21 cm Tube secured with: Tape Dental Injury: Teeth and Oropharynx as per pre-operative assessment

## 2017-04-03 NOTE — Transfer of Care (Signed)
Immediate Anesthesia Transfer of Care Note  Patient: Amanda [name]  Procedure(s) Performed: Procedure(s): LAPAROSCOPIC CHOLECYSTECTOMY (N/A)  Patient Location: PACU  Anesthesia Type:General  Level of Consciousness: awake  Airway & Oxygen Therapy: Patient connected to face mask oxygen  Post-op Assessment: Post -op Vital signs reviewed and stable  Post vital signs: stable  Last Vitals:  Vitals:   04/03/17 1317 04/03/17 1730  BP: 125/73 135/89  Pulse: (!) 55 (!) 103  Resp: 16 20  Temp: (!) 36.3 C (!) 36.2 C    Last Pain:  Vitals:   04/03/17 1730  TempSrc: Temporal  PainSc:       Patients Stated Pain Goal: 2 (04/03/17 1317)  Complications: No apparent anesthesia complications

## 2017-04-03 NOTE — Plan of Care (Signed)
Problem: Fluid Volume: Goal: Ability to maintain a balanced intake and output will improve Outcome: Not Progressing Patient is NPO.  Problem: Nutrition: Goal: Adequate nutrition will be maintained Outcome: Not Progressing Patient is NPO.   

## 2017-04-04 ENCOUNTER — Encounter: Payer: Self-pay | Admitting: Surgery

## 2017-04-04 LAB — GLUCOSE, CAPILLARY
Glucose-Capillary: 145 mg/dL — ABNORMAL HIGH (ref 65–99)
Glucose-Capillary: 127 mg/dL — ABNORMAL HIGH (ref 65–99)
Glucose-Capillary: 131 mg/dL — ABNORMAL HIGH (ref 65–99)
Glucose-Capillary: 112 mg/dL — ABNORMAL HIGH (ref 65–99)

## 2017-04-04 MED ORDER — HYDROMORPHONE HCL 2 MG PO TABS
1.0000 mg | ORAL_TABLET | ORAL | Status: DC | PRN
  Administered 2017-04-05: 2 mg via ORAL
  Filled 2017-04-04: qty 1

## 2017-04-04 MED ORDER — PREMIER PROTEIN SHAKE
11.0000 [oz_av] | Freq: Two times a day (BID) | ORAL | Status: DC
  Administered 2017-04-04 – 2017-04-05 (×3): 11 [oz_av] via ORAL

## 2017-04-04 MED ORDER — LACTATED RINGERS IV SOLN
INTRAVENOUS | Status: DC
  Administered 2017-04-04 (×2): via INTRAVENOUS

## 2017-04-04 NOTE — Progress Notes (Signed)
Nutrition Follow-up  DOCUMENTATION CODES:   Obesity unspecified  INTERVENTION:  1. Premier Protein BID, each supplement provides 160 calories and 30 grams of protein  NUTRITION DIAGNOSIS:   Inadequate oral intake related to poor appetite, nausea (pain) as evidenced by per patient/family report. -new dx  GOAL:   Patient will meet greater than or equal to 90% of their needs -progressing  MONITOR:   PO intake, I & O's, Labs, Supplement acceptance, Diet advancement, Weight trends  REASON FOR ASSESSMENT:   Malnutrition Screening Tool    ASSESSMENT:   51 year old female with abdominal pain, with cholelithiasis, recurrent incisional hernia, as well as H. pylori which was recently treated. Admitted for acute cholecystitis   Now 1 day s/p lap cholecystectomy  Spoke with patient briefly. She complains of nausea and pain today States she could not eat grits due to pain, but otherwise ate well Meal Completion: 100% Lethargic during visit, received pain medication prior.  Medications reviewed and include:  Novolog 0-20 units TID, 0-5 Units HS IV flagyl Ringers at 4475mL/hr  Labs reviewed:  CBGs 127, 145, 177  500mL UOP yesterday, 400 so far today  Diet Order:  Diet full liquid Room service appropriate? Yes; Fluid consistency: Thin  Skin:  Reviewed, no issues  Last BM:  7/31  Height:   Ht Readings from Last 1 Encounters:  04/03/17 5\' 5"  (1.651 m)     Weight:   Wt Readings from Last 1 Encounters:  04/03/17 192 lb (87.1 kg)    Ideal Body Weight:  56.8 kg  BMI:  Body mass index is 31.95 kg/m.  Estimated Nutritional Needs:   Kcal:  1600-1800kcal/day   Protein:  87-104g/day   Fluid:  >1.6L/day   EDUCATION NEEDS:   No education needs identified at this time  Dionne AnoWilliam M. Eames Dibiasio, MS, RD LDN Inpatient Clinical Dietitian Pager [phone]

## 2017-04-04 NOTE — Care Management Important Message (Signed)
Important Message  Patient Details  Name: Amanda [name] MRN: [id number] Date of Birth: 12/18/1965   Medicare Important Message Given:  Yes    Chapman FitchBOWEN, Shakaria Raphael T, RN 04/04/2017, 4:27 PM

## 2017-04-04 NOTE — Progress Notes (Signed)
04/04/2017  Subjective: Patient is 1 Day Post-Op s/p laparoscopic cholecystectomy and incisional hernia repair.  No acute events overnight.  She reports incisional pain.  Mild nausea when taking clears yesterday but would like to try more food today.  Vital signs: Temp:  [97.1 F (36.2 C)-98.3 F (36.8 C)] 98.1 F (36.7 C) (08/03 1225) Pulse Rate:  [62-103] 62 (08/03 1225) Resp:  [0-22] 20 (08/03 1226) BP: (100-146)/(58-94) 100/61 (08/03 1225) SpO2:  [91 %-100 %] 96 % (08/03 1225)   Intake/Output: 08/02 0701 - 08/03 0700 In: 4275.3 [P.O.:780; I.V.:3095.3; IV Piggyback:400] Out: 500 [Urine:500] Last BM Date: 03/31/17  Physical Exam: Constitutional: No acute distress Abdomen:  Soft, nondistended, appropriately tender to palpation over the incisions, which are clean, dry, and intact.  Labs:   Recent Labs  04/02/17 1906  WBC 3.8  HGB 12.9  HCT 39.0  PLT 235    Recent Labs  04/02/17 1906  NA 139  K 3.8  CL 104  CO2 28  GLUCOSE 149*  BUN 11  CREATININE 0.87  CALCIUM 9.5   No results for input(s): LABPROT, INR in the last 72 hours.  Imaging: No results found.  Assessment/Plan: 51 yo female s/p laparoscopic cholecystectomy and incisional hernia repair  --will start full liquid diet and see how patient does and tolerate.  May be able to advance further if she does well --continue antibiotics and pain medication. --possible discharge today vs tomorrow depending on how patient progresses.  She was in significant pain prior to surgery and may be harder to control well initially.   Howie IllJose Luis Portia Wisdom, MD [facility]

## 2017-04-05 LAB — GLUCOSE, CAPILLARY
Glucose-Capillary: 119 mg/dL — ABNORMAL HIGH (ref 65–99)
Glucose-Capillary: 176 mg/dL — ABNORMAL HIGH (ref 65–99)

## 2017-04-05 MED ORDER — ONDANSETRON 4 MG PO TBDP: 4.0000 mg | ORAL_TABLET | Freq: Three times a day (TID) | ORAL | 0 refills | Status: DC | PRN

## 2017-04-05 MED ORDER — GI COCKTAIL ~~LOC~~
30.0000 mL | Freq: Once | ORAL | Status: AC
  Administered 2017-04-05: 30 mL via ORAL
  Filled 2017-04-05: qty 30

## 2017-04-05 MED ORDER — PANTOPRAZOLE SODIUM 40 MG PO TBEC: 40.0000 mg | DELAYED_RELEASE_TABLET | Freq: Every day | ORAL | Status: DC

## 2017-04-05 MED ORDER — PREMIER PROTEIN SHAKE: 11.0000 [oz_av] | Freq: Three times a day (TID) | ORAL | 0 refills | Status: DC

## 2017-04-05 MED ORDER — OXYCODONE HCL 5 MG PO TABS: 5.0000 mg | ORAL_TABLET | Freq: Four times a day (QID) | ORAL | 0 refills | Status: DC | PRN

## 2017-04-05 MED ORDER — IBUPROFEN 600 MG PO TABS: 600.0000 mg | ORAL_TABLET | Freq: Three times a day (TID) | ORAL | 0 refills | Status: DC | PRN

## 2017-04-05 MED ORDER — OMEPRAZOLE 40 MG PO CPDR: 40.0000 mg | DELAYED_RELEASE_CAPSULE | Freq: Every day | ORAL | 0 refills | Status: DC

## 2017-04-05 NOTE — Discharge Summary (Signed)
Patient ID: Amanda [name] MRN: [id number] DOB/AGE: Sep 19, 1965 51 y.o.  Admit date: 04/02/2017 Discharge date: 04/05/2017   Discharge Diagnoses:  Active Problems:   Calculus of gallbladder without cholecystitis without obstruction   Symptomatic cholelithiasis   Procedures:  Laparoscopic cholecystectomy  Hospital Course:  Patient was admitted on 04/02/17 after being seen in the office with unrelenting pain and nausea, with diagnosis of symptomatic cholelithiasis.  She was taken to the OR on 04/03/17 and underwent a laparoscopic cholecystectomy.  She tolerated the procedure well.  Her diet was slowly advanced.  Her nausea and pain became better controlled.  She was ambulating, voiding without issues.  She was tolerating a soft diet, taking a protein shake for supplementation.  Her incisions were clean, dry, and intact.  She was deemed ready for discharge to home.  Consults: None  Disposition: 01-Home or Self Care  Discharge Instructions    Call MD for:  difficulty breathing, headache or visual disturbances    Complete by:  As directed    Call MD for:  persistant nausea and vomiting    Complete by:  As directed    Call MD for:  redness, tenderness, or signs of infection (pain, swelling, redness, odor or green/yellow discharge around incision site)    Complete by:  As directed    Call MD for:  severe uncontrolled pain    Complete by:  As directed    Call MD for:  temperature >100.4    Complete by:  As directed    Diet - low sodium heart healthy    Complete by:  As directed    Discharge instructions    Complete by:  As directed    1.  Patient may shower, but do not scrub wound heavily and dab dry only. 2.  Do not submerge wounds in pool/tub 3.  Do not apply ointments or hydrogen peroxide to the wounds. 4.  Can take Maalox or Zofran for nausea / indigestion.   Driving Restrictions    Complete by:  As directed    Do not drive while taking narcotics for pain control.   Increase activity  slowly    Complete by:  As directed    Lifting restrictions    Complete by:  As directed    No heavy lifting or pushing of more than 10-15 lbs for 4 weeks.   No dressing needed    Complete by:  As directed      Allergies as of 04/05/2017      Reactions   Penicillins Anaphylaxis   Has patient had a PCN reaction causing immediate rash, facial/tongue/throat swelling, SOB or lightheadedness with hypotension: Yes Has patient had a PCN reaction causing severe rash involving mucus membranes or skin necrosis: No Has patient had a PCN reaction that required hospitalization No Has patient had a PCN reaction occurring within the last 10 years: No If all of the above answers are "NO", then may proceed with Cephalosporin use.   Ace Inhibitors Swelling      Medication List    STOP taking these medications   HYDROcodone-acetaminophen 5-325 MG tablet Commonly known as:  NORCO/VICODIN     TAKE these medications   albuterol 108 (90 Base) MCG/ACT inhaler Commonly known as:  PROVENTIL HFA Inhale 2 puffs into the lungs every 4 (four) hours as needed for wheezing or shortness of breath.   ALPRAZolam 1 MG tablet Commonly known as:  XANAX Take 1 mg by mouth at bedtime as needed for anxiety or  sleep.   ARIPiprazole 10 MG tablet Commonly known as:  ABILIFY Take 10 mg by mouth daily as needed.   BIOTIN PO Take 1 tablet by mouth daily.   dexmethylphenidate 10 MG tablet Commonly known as:  FOCALIN Take 10 mg by mouth 2 (two) times daily.   Diclofenac Sodium 3 % Gel Place 1 application onto the skin every 12 (twelve) hours as needed.   diltiazem 240 MG 24 hr capsule Commonly known as:  DILACOR XR Take 240 mg by mouth daily.   DULoxetine 60 MG capsule Commonly known as:  CYMBALTA Take 60 mg by mouth daily. AM   flunisolide 25 MCG/ACT (0.025%) Soln Commonly known as:  NASALIDE Place 2 sprays into the nose daily as needed.   fluticasone furoate-vilanterol 200-25 MCG/INH Aepb Commonly  known as:  BREO ELLIPTA Inhale 1 puff into the lungs daily.   furosemide 20 MG tablet Commonly known as:  LASIX Take 30 mg by mouth daily.   glucosamine-chondroitin 500-400 MG tablet Take 1 tablet by mouth daily.   ibuprofen 600 MG tablet Commonly known as:  ADVIL,MOTRIN Take 1 tablet (600 mg total) by mouth every 8 (eight) hours as needed for mild pain or moderate pain. What changed:  reasons to take this   insulin detemir 100 UNIT/ML injection Commonly known as:  LEVEMIR Inject 10 Units into the skin at bedtime.   INVOKANA 300 MG Tabs tablet Generic drug:  canagliflozin Take 300 mg by mouth daily before breakfast.   ipratropium-albuterol 0.5-2.5 (3) MG/3ML Soln Commonly known as:  DUONEB Take 3 mLs by nebulization every 4 (four) hours as needed.   isosorbide mononitrate 30 MG 24 hr tablet Commonly known as:  IMDUR Take 30 mg by mouth daily.   LINZESS 290 MCG Caps capsule Generic drug:  linaclotide Take 1 tablet by mouth daily.   losartan 25 MG tablet Commonly known as:  COZAAR Take 25 mg by mouth daily.   metFORMIN 500 MG tablet Commonly known as:  GLUCOPHAGE Take 1,000 mg by mouth 2 (two) times daily with a meal.   metoprolol succinate 50 MG 24 hr tablet Commonly known as:  TOPROL-XL Take 50 mg by mouth daily. Take with or immediately following a meal.   mirtazapine 30 MG tablet Commonly known as:  REMERON Take 30 mg by mouth at bedtime as needed.   montelukast 10 MG tablet Commonly known as:  SINGULAIR Take 1 tablet (10 mg total) by mouth daily.   multivitamin tablet Take 1 tablet by mouth daily.   omeprazole 40 MG capsule Commonly known as:  PRILOSEC Take 1 capsule (40 mg total) by mouth daily. 1 hr before supper   ondansetron 4 MG disintegrating tablet Commonly known as:  ZOFRAN ODT Take 1 tablet (4 mg total) by mouth every 8 (eight) hours as needed for nausea or vomiting.   oxyCODONE 5 MG immediate release tablet Commonly known as:  Oxy  IR/ROXICODONE Take 1-2 tablets (5-10 mg total) by mouth every 6 (six) hours as needed for severe pain.   pregabalin 150 MG capsule Commonly known as:  LYRICA Take 150 mg by mouth 3 (three) times daily.   protein supplement shake Liqd Commonly known as:  PREMIER PROTEIN Take 325 mLs (11 oz total) by mouth 3 (three) times daily between meals.   RESTASIS 0.05 % ophthalmic emulsion Generic drug:  cycloSPORINE 1 drop 2 (two) times daily.   sucralfate 1 g tablet Commonly known as:  CARAFATE Take 1 g by mouth 4 (four)  times daily -  with meals and at bedtime.   SUMAtriptan 25 MG tablet Commonly known as:  IMITREX Take 25 mg by mouth every 2 (two) hours as needed for migraine. May repeat in 2 hours if headache persists or recurs.   tiZANidine 4 MG tablet Commonly known as:  ZANAFLEX Take 4 mg by mouth every 6 (six) hours as needed for muscle spasms.   vitamin C 1000 MG tablet Take 1,000 mg by mouth daily.   zolpidem 5 MG tablet Commonly known as:  AMBIEN Take 5 mg by mouth at bedtime.      Follow-up Information    Sherrine Salberg, Elita QuickJose, MD Follow up in 3 week(s).   Specialty:  Surgery Why:  Follow up in 2-3 weeks. Contact information: [address] [place] [address] [phone]

## 2017-04-05 NOTE — Progress Notes (Signed)
04/05/2017  Subjective: Patient is 2 Days Post-Op status post laparoscopic cholecystectomy and incisional hernia repair. Patient reports that her pain is doing a bit better today. She does endorse some nausea when she is eating but this is also improved. She is able tolerate better her protein shakes.  Vital signs: Temp:  [97.8 F (36.6 C)-98.1 F (36.7 C)] 97.8 F (36.6 C) (08/04 0433) Pulse Rate:  [53-62] 58 (08/04 0433) Resp:  [0-20] 14 (08/04 0433) BP: (91-101)/(52-61) 101/58 (08/04 0433) SpO2:  [91 %-97 %] 91 % (08/04 0433)   Intake/Output: 08/03 0701 - 08/04 0700 In: 1783 [P.O.:720; I.V.:963; IV Piggyback:100] Out: 1850 [Urine:1850] Last BM Date: 04/01/17  Physical Exam: Constitutional: No acute distress Abdomen: Soft, nondistended, properly tender to palpation. The patient's pain is more at the incisions themselves and no longer has a positive Murphy sign that she did preoperatively.  Labs:   Recent Labs  04/02/17 1906  WBC 3.8  HGB 12.9  HCT 39.0  PLT 235    Recent Labs  04/02/17 1906  NA 139  K 3.8  CL 104  CO2 28  GLUCOSE 149*  BUN 11  CREATININE 0.87  CALCIUM 9.5   No results for input(s): LABPROT, INR in the last 72 hours.  Imaging: No results found.  Assessment/Plan: 51 year old female status post laparoscopic cholecystectomy and incisional hernia repair.  -We'll advance the patient's diet to soft diet today. -Discontinue IV antibiotics. -We'll give the patient one time dose of GI cocktail to see that would help soothe some of her nausea. -Depending on the patient is doing today, we'll likely discharge her to home today.   Howie IllJose Luis Nilam Quakenbush, MD [facility]

## 2017-04-05 NOTE — Progress Notes (Signed)
PHARMACIST - PHYSICIAN COMMUNICATION  CONCERNING: IV to Oral Route Change Policy  RECOMMENDATION: This patient is receiving PROTONIX by the intravenous route.  Based on criteria approved by the Pharmacy and Therapeutics Committee, the intravenous medication(s) is/are being converted to the equivalent oral dose form(s).   DESCRIPTION: These criteria include:  The patient is eating (either orally or via tube) and/or has been taking other orally administered medications for a least 24 hours  The patient has no evidence of active gastrointestinal bleeding or impaired GI absorption (gastrectomy, short bowel, patient on TNA or NPO).  If you have questions about this conversion, please contact the Pharmacy Department  []   [phone]( [phone] )  [facility] [x]   [phone]( [phone] )  [facility] []   [phone]( [phone] )  [facility] []   [phone]( [phone] )  [facility] []   [phone]( [phone] )  [facility]   Artavius Stearns A, [facility]icenterRPH 04/05/2017 3:02 PM

## 2017-04-05 NOTE — Progress Notes (Signed)
Pt discharged per MD order. Discharge instructions reviewed with pts. Questions answered to satisfaction. Prescriptions and work note given to pt. IV removed per order. Pt taken to car via wheelchair.

## 2017-04-07 ENCOUNTER — Telehealth: Payer: Self-pay

## 2017-04-07 LAB — SURGICAL PATHOLOGY

## 2017-04-07 NOTE — Telephone Encounter (Signed)
Post-op call made to patient at this time. No answer. Unable to leave voicemail as this is full. Will try once again at a later time.

## 2017-04-08 ENCOUNTER — Telehealth: Payer: Self-pay

## 2017-04-08 NOTE — Telephone Encounter (Signed)
Post-op call made to patient at this time. No answer. Unable to leave voicemail as voicemail is full. Will be glad to speak with patient if she calls in.

## 2017-04-08 NOTE — Telephone Encounter (Signed)
Patient's Leave of Absence was filled out and will be faxed once patient pays the $25 fee.

## 2017-04-09 ENCOUNTER — Telehealth: Payer: Self-pay | Admitting: General Practice

## 2017-04-09 NOTE — Telephone Encounter (Signed)
Letter reprinted and left at front desk with Nicholaus BloomKelley for pick-up.

## 2017-04-09 NOTE — Telephone Encounter (Signed)
Patient is asking if we can resend her letter for the patient to go back to work, patient is unable to find her note. Please advice.

## 2017-04-16 ENCOUNTER — Other Ambulatory Visit: Payer: Self-pay | Admitting: Internal Medicine

## 2017-04-16 MED ORDER — ALBUTEROL SULFATE HFA 108 (90 BASE) MCG/ACT IN AERS: 2.0000 | INHALATION_SPRAY | RESPIRATORY_TRACT | 0 refills | Status: DC | PRN

## 2017-04-16 MED ORDER — MONTELUKAST SODIUM 10 MG PO TABS: 10.0000 mg | ORAL_TABLET | Freq: Every day | ORAL | 0 refills | Status: DC

## 2017-04-16 MED ORDER — FLUTICASONE FUROATE-VILANTEROL 200-25 MCG/INH IN AEPB: 1.0000 | INHALATION_SPRAY | Freq: Every day | RESPIRATORY_TRACT | 0 refills | Status: DC

## 2017-04-16 NOTE — Telephone Encounter (Signed)
Received request for Breo, Singulair, Proair, Duoneb, and Flunisolide. We have only given Breo, Singulair and Proair, therefore that's all authorized with 90/0 refill. Patient needs f/u before future refills.

## 2017-04-17 ENCOUNTER — Emergency Department
Admission: EM | Admit: 2017-04-17 | Discharge: 2017-04-17 | Disposition: A | Discharge status: Home / Self Care | Payer: Medicare Other | Attending: Student in an Organized Health Care Education/Training Program | Admitting: Student in an Organized Health Care Education/Training Program

## 2017-04-17 ENCOUNTER — Emergency Department: Payer: Medicare Other

## 2017-04-17 DIAGNOSIS — E119 Type 2 diabetes mellitus without complications: Secondary | ICD-10-CM | POA: Insufficient documentation

## 2017-04-17 DIAGNOSIS — R2242 Localized swelling, mass and lump, left lower limb: Secondary | ICD-10-CM | POA: Insufficient documentation

## 2017-04-17 DIAGNOSIS — I10 Essential (primary) hypertension: Secondary | ICD-10-CM | POA: Insufficient documentation

## 2017-04-17 DIAGNOSIS — Z79899 Other long term (current) drug therapy: Secondary | ICD-10-CM | POA: Diagnosis not present

## 2017-04-17 DIAGNOSIS — J45909 Unspecified asthma, uncomplicated: Secondary | ICD-10-CM | POA: Insufficient documentation

## 2017-04-17 DIAGNOSIS — R1084 Generalized abdominal pain: Secondary | ICD-10-CM | POA: Diagnosis not present

## 2017-04-17 DIAGNOSIS — Z794 Long term (current) use of insulin: Secondary | ICD-10-CM | POA: Insufficient documentation

## 2017-04-17 DIAGNOSIS — M25572 Pain in left ankle and joints of left foot: Secondary | ICD-10-CM | POA: Diagnosis not present

## 2017-04-17 DIAGNOSIS — M7989 Other specified soft tissue disorders: Secondary | ICD-10-CM

## 2017-04-17 LAB — CBC
HCT: 38 % (ref 35.0–47.0)
Hemoglobin: 12.9 g/dL (ref 12.0–16.0)
MCH: 30.1 pg (ref 26.0–34.0)
MCHC: 34.1 g/dL (ref 32.0–36.0)
MCV: 88.5 fL (ref 80.0–100.0)
Platelets: 286 10*3/uL (ref 150–440)
RBC: 4.29 MIL/uL (ref 3.80–5.20)
RDW: 14.9 % — ABNORMAL HIGH (ref 11.5–14.5)
WBC: 6.2 10*3/uL (ref 3.6–11.0)

## 2017-04-17 LAB — BASIC METABOLIC PANEL
Anion gap: 10 (ref 5–15)
BUN: 16 mg/dL (ref 6–20)
CO2: 26 mmol/L (ref 22–32)
Calcium: 9.5 mg/dL (ref 8.9–10.3)
Chloride: 103 mmol/L (ref 101–111)
Creatinine, Ser: 0.85 mg/dL (ref 0.44–1.00)
GFR calc Af Amer: >60 mL/min (ref >60)
GFR calc non Af Amer: >60 mL/min (ref >60)
Glucose, Bld: 106 mg/dL — ABNORMAL HIGH (ref 65–99)
Potassium: 3.7 mmol/L (ref 3.5–5.1)
Sodium: 139 mmol/L (ref 135–145)

## 2017-04-17 MED ORDER — HYDROCODONE-ACETAMINOPHEN 5-325 MG PO TABS: 1.0000 | ORAL_TABLET | ORAL | 0 refills | Status: DC | PRN

## 2017-04-17 MED ORDER — POLYETHYLENE GLYCOL 3350 17 G PO PACK: 17.0000 g | PACK | Freq: Every day | ORAL | 0 refills | Status: DC

## 2017-04-17 MED ORDER — OXYCODONE-ACETAMINOPHEN 5-325 MG PO TABS
2.0000 | ORAL_TABLET | Freq: Once | ORAL | Status: AC
  Administered 2017-04-17: 2 via ORAL
  Filled 2017-04-17: qty 2
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
## 2017-04-17 NOTE — Discharge Instructions (Signed)

## 2017-04-17 NOTE — ED Notes (Signed)
Verbal orders given from MD Roxan Hockeyobinson

## 2017-04-17 NOTE — ED Notes (Addendum)
First Nurse: [facility]  with reports of recent ankle surgery and now having swelling and pain in calf, increases with walking.

## 2017-04-17 NOTE — ED Provider Notes (Signed)
[facility] Emergency Department Provider Note    First MD Initiated Contact with Patient 04/17/17 1321     (approximate)  I have reviewed the triage vital signs and the nursing notes.   HISTORY  Chief Complaint Ankle Pain    HPI Amanda [name] is a 51 y.o. female presents with a chief complaint of left ankle pain and swelling. Patient is so weeks post cholecystectomy and hernia repair.  Denies any fevers. No nausea or vomiting. As a secondary complaint patient states that she's having worsening abdominal pain after having surgery running out of her oxycodone tens several days ago. Denies any dysuria. She is moving her bowels. States the pain in her ankle is mild-to-moderate but is able to ambulate on the leg. States that she did fall down the steps and rolled her left ankle. Has not seen an orthopedic or a podiatrist for this.   Past Medical History:  Diagnosis Date  . Arthritis    knees, shoulder, upper back  . Asthma   . Diabetes mellitus without complication (HCC)   . Environmental allergies   . Fibromyalgia   . GERD (gastroesophageal reflux disease)   . Headache    migraines - 5x/mo  . Hypertension   . Migraines   . Motion sickness    ships  . Sleep apnea   . Thyroid nodule    bilateral and goiter  . Wears contact lenses   . Wears dentures    partial upper   Family History  Problem Relation Age of Onset  . Hypertension Mother   . Hypertension Father   . Diabetes Father   . Allergies Father    Past Surgical History:  Procedure Laterality Date  . ABDOMINAL HYSTERECTOMY  2000's  . ABDOMINAL SURGERY     laparoscopy x4 with lysis of adhesions  . APPENDECTOMY  1986  . CESAREAN SECTION  1992  . CHOLECYSTECTOMY N/A 04/03/2017   Procedure: LAPAROSCOPIC CHOLECYSTECTOMY;  Surgeon: Henrene Dodge, MD;  Location: [facility] ORS;  Service: General;  Laterality: N/A;  . COLONOSCOPY WITH PROPOFOL N/A 03/10/2017   Procedure: COLONOSCOPY WITH PROPOFOL;   Surgeon: Scot Jun, MD;  Location: [facility] ENDOSCOPY;  Service: Endoscopy;  Laterality: N/A;  . ECTOPIC PREGNANCY SURGERY  2000's  . ESOPHAGOGASTRODUODENOSCOPY (EGD) WITH PROPOFOL N/A 03/10/2017   Procedure: ESOPHAGOGASTRODUODENOSCOPY (EGD) WITH PROPOFOL;  Surgeon: Scot Jun, MD;  Location: [facility] ENDOSCOPY;  Service: Endoscopy;  Laterality: N/A;  . HERNIA REPAIR    . JOINT REPLACEMENT Right 12/16/12   knee- medial - makoplasty  . KNEE ARTHROSCOPY Right 2006   partial medial and lateral meniscectomies  . KNEE ARTHROSCOPY Right 10/06/2015   Procedure: RIGHT KNEE ARTHROSCOPY WITH DEBRIDEMENT;  Surgeon: Erin Sons, MD;  Location: [facility];  Service: Orthopedics;  Laterality: Right;  Diabetic - insulin and oral meds  . ROTATOR CUFF REPAIR Left 2006  . TUBAL LIGATION  2000's   Patient Active Problem List   Diagnosis Date Noted  . Symptomatic cholelithiasis 04/02/2017  . Recurrent incisional hernia 03/17/2017  . Calculus of gallbladder without cholecystitis without obstruction 03/17/2017  . Helicobacter pylori gastritis 03/17/2017  . Community acquired pneumonia 09/28/2016  . Depression 09/28/2016  . Diabetes mellitus, type 2 (HCC) 09/28/2016  . Nausea and vomiting 09/28/2016  . Acute respiratory failure with hypoxemia (HCC) 03/18/2016  . Acute right ankle pain 10/04/2014  . Esophageal reflux 06/22/2012      Prior to Admission medications   Medication Sig Start Date End  Date Taking? Authorizing Provider  albuterol (PROVENTIL HFA) 108 (90 Base) MCG/ACT inhaler Inhale 2 puffs into the lungs every 4 (four) hours as needed for wheezing or shortness of breath. 04/16/17   Shane Crutch, MD  ALPRAZolam Prudy Feeler) 1 MG tablet Take 1 mg by mouth at bedtime as needed for anxiety or sleep.    [provider]  ARIPiprazole (ABILIFY) 10 MG tablet Take 10 mg by mouth daily as needed.    [provider]  Ascorbic Acid (VITAMIN C) 1000 MG tablet Take 1,000 mg  by mouth daily.    [provider]  BIOTIN PO Take 1 tablet by mouth daily.     [provider]  canagliflozin (INVOKANA) 300 MG TABS tablet Take 300 mg by mouth daily before breakfast.    [provider]  cycloSPORINE (RESTASIS) 0.05 % ophthalmic emulsion 1 drop 2 (two) times daily.    [provider]  dexmethylphenidate (FOCALIN) 10 MG tablet Take 10 mg by mouth 2 (two) times daily. 08/16/16   [provider]  Diclofenac Sodium 3 % GEL Place 1 application onto the skin every 12 (twelve) hours as needed. 10/08/16   Schaevitz, Myra Rude, MD  diltiazem (DILACOR XR) 240 MG 24 hr capsule Take 240 mg by mouth daily.    [provider]  DULoxetine (CYMBALTA) 60 MG capsule Take 60 mg by mouth daily. AM    [provider]  flunisolide (NASALIDE) 25 MCG/ACT (0.025%) SOLN Place 2 sprays into the nose daily as needed.    [provider]  fluticasone furoate-vilanterol (BREO ELLIPTA) 200-25 MCG/INH AEPB Inhale 1 puff into the lungs daily. 04/16/17   Shane Crutch, MD  furosemide (LASIX) 20 MG tablet Take 30 mg by mouth daily.     [provider]  glucosamine-chondroitin 500-400 MG tablet Take 1 tablet by mouth daily.     [provider]  HYDROcodone-acetaminophen (NORCO) 5-325 MG tablet Take 1 tablet by mouth every 4 (four) hours as needed for moderate pain. 04/17/17   Willy Eddy, MD  ibuprofen (ADVIL,MOTRIN) 600 MG tablet Take 1 tablet (600 mg total) by mouth every 8 (eight) hours as needed for mild pain or moderate pain. 04/05/17   Piscoya, Elita Quick, MD  insulin detemir (LEVEMIR) 100 UNIT/ML injection Inject 10 Units into the skin at bedtime.    [provider]  ipratropium-albuterol (DUONEB) 0.5-2.5 (3) MG/3ML SOLN Take 3 mLs by nebulization every 4 (four) hours as needed.    [provider]  isosorbide mononitrate (IMDUR) 30 MG 24 hr tablet Take 30 mg by mouth daily.    [provider]  LINZESS 290 MCG CAPS capsule Take 1 tablet by mouth daily. 02/28/17   [provider]  losartan (COZAAR) 25 MG tablet Take 25 mg by mouth daily.    [provider]  metFORMIN (GLUCOPHAGE) 500 MG tablet Take 1,000 mg by mouth 2 (two) times daily with a meal.     [provider]  metoprolol succinate (TOPROL-XL) 50 MG 24 hr tablet Take 50 mg by mouth daily. Take with or immediately following a meal.    [provider]  mirtazapine (REMERON) 30 MG tablet Take 30 mg by mouth at bedtime as needed.     [provider]  montelukast (SINGULAIR) 10 MG tablet Take 1 tablet (10 mg total) by mouth daily. 04/16/17 04/16/18  Shane Crutch, MD  Multiple Vitamin (MULTIVITAMIN) tablet Take 1 tablet by mouth daily.    [provider]  omeprazole (PRILOSEC) 40 MG capsule Take 1 capsule (40 mg total) by mouth daily. 1 hr before supper 04/05/17   Piscoya, Elita Quick, MD  ondansetron (ZOFRAN ODT) 4 MG disintegrating tablet Take 1 tablet (4 mg total) by mouth every 8 (eight) hours as needed for nausea or vomiting. 04/05/17   Henrene Dodge, MD  oxyCODONE (OXY IR/ROXICODONE) 5 MG immediate release tablet Take 1-2 tablets (5-10 mg total) by mouth every 6 (six) hours as needed for severe pain. 04/05/17   Piscoya, Elita Quick, MD  polyethylene glycol (MIRALAX / GLYCOLAX) packet Take 17 g by mouth daily. Mix one tablespoon with 8oz of your favorite juice or water every day until you are having soft formed stools. Then start taking once daily if you didn't have a stool the day before. 04/17/17   Willy Eddy, MD  pregabalin (LYRICA) 150 MG capsule Take 150 mg by mouth 3 (three) times daily.     [provider]  protein supplement shake (PREMIER PROTEIN) LIQD Take 325 mLs (11 oz total) by mouth 3 (three) times daily between meals. 04/05/17   Henrene Dodge, MD  sucralfate (CARAFATE) 1 g tablet Take 1 g by mouth 4 (four) times daily -  with meals and at bedtime.    [provider]  SUMAtriptan (IMITREX) 25 MG tablet Take 25 mg by mouth every 2 (two) hours as needed for migraine. May repeat in 2 hours if headache persists or recurs.    [provider]  tiZANidine (ZANAFLEX) 4 MG tablet Take 4 mg by mouth every 6 (six) hours as needed for muscle spasms.    [provider]  zolpidem (AMBIEN) 5 MG tablet Take 5 mg by mouth at bedtime.     [provider]    Allergies Penicillins and Ace inhibitors    Social History Social History  Substance Use Topics  . Smoking status: Never Smoker  . Smokeless tobacco: Never Used  . Alcohol use No    Review of Systems Patient denies headaches, rhinorrhea, blurry vision, numbness, shortness of breath, chest pain, edema, cough, abdominal pain, nausea, vomiting, diarrhea, dysuria, fevers, rashes or hallucinations unless otherwise stated above in HPI. ____________________________________________   PHYSICAL EXAM:  VITAL SIGNS: Vitals:   04/17/17 1232 04/17/17 1323  BP: (!) 142/72 (!) 150/95  Pulse: 80 85  Resp: 20 18  Temp: 98.9 F (37.2 C)   SpO2: 99% 99%    Constitutional: Alert and oriented. Well appearing and in no acute distress. Eyes: Conjunctivae are normal.  Head: Atraumatic. Nose: No congestion/rhinnorhea. Mouth/Throat: Mucous membranes are moist.   Neck: No stridor. Painless ROM.  Cardiovascular: Normal rate, regular rhythm. Grossly normal heart sounds.  Good peripheral circulation. Respiratory: Normal respiratory effort.  No retractions. Lungs CTAB. Gastrointestinal: Soft , appropriate tenderness in post op setting.  Incisions are c/d/i, no erythema or induration.  No distention. No abdominal bruits. No CVA tenderness. Genitourinary:  Musculoskeletal: No lower extremity tenderness nor edema.  No joint effusions. Neurologic:  Normal speech and language. No gross focal neurologic deficits are appreciated. No facial droop Skin:  Skin is warm, dry and intact. No rash  noted. Psychiatric: Mood and affect are normal. Speech and behavior are normal.  ____________________________________________   LABS (all labs ordered are listed, but only abnormal results are displayed)  Results for orders placed or performed during the hospital encounter of 04/17/17 (from the past 24 hour(s))  Basic metabolic panel     Status: Abnormal   Collection Time: 04/17/17 12:43 PM  Result Value Ref Range   Sodium 139 135 - 145 mmol/L   Potassium 3.7 3.5 - 5.1 mmol/L   Chloride 103 101 - 111 mmol/L   CO2 26 22 - 32 mmol/L   Glucose, Bld 106 (H) 65 - 99 mg/dL   BUN 16 6 - 20 mg/dL   Creatinine, Ser 1.610.85 0.44 - 1.00 mg/dL   Calcium 9.5 8.9 - 09.610.3 mg/dL   GFR calc non Af Amer >60 >60 mL/min   GFR calc Af Amer >60 >60 mL/min   Anion gap 10 5 - 15  CBC     Status: Abnormal   Collection Time: 04/17/17 12:43 PM  Result Value Ref Range   WBC 6.2 3.6 - 11.0 K/uL   RBC 4.29 3.80 - 5.20 MIL/uL   Hemoglobin 12.9 12.0 - 16.0 g/dL   HCT 04.538.0 40.935.0 - 81.147.0 %   MCV 88.5 80.0 - 100.0 fL   MCH 30.1 26.0 - 34.0 pg   MCHC 34.1 32.0 - 36.0 g/dL   RDW 91.414.9 (H) 78.211.5 - 95.614.5 %   Platelets 286 150 - 440 K/uL   ____________________________________________ ____________________________________________  RADIOLOGY  I personally reviewed all radiographic images ordered to evaluate for the above acute complaints and reviewed radiology reports and findings.  These findings were personally discussed with the patient.  Please see medical record for radiology report.  ____________________________________________   PROCEDURES  Procedure(s) performed:  Procedures    Critical Care performed: no ____________________________________________   INITIAL IMPRESSION / ASSESSMENT AND PLAN / ED COURSE  Pertinent labs & imaging results that were available during my care of the patient were reviewed by me and considered in my medical decision making (see chart for details).  DDX: fracture, dvt,  cellulitis, sprain, constipation, perforation, ileus  Amanda [name] is a 51 y.o. who presents to the ED with Above complaints. Patient afebrile hemodynamic stable. Ultrasound ordered due to concern for DVT as she is in the postoperative phase. Ultrasound shows no evidence of DVT. Physical exam shows no evidence of neurologic deficit or cellulitis. X-ray shows no evidence of fracture. Possible worsening pain and swelling secondary to recent sprain. Patient does have a ankle brace at this point. Her abdominal exam is soft and appropriately tender in the postoperative phase. She has no leukocytosis or fever and is able to tolerate oral hydration and oral pain medications. I do not feel that CT imaging is clinically indicated with this clinical presentation. This point. Feel that she is appropriate for follow-up with podiatry as well as with her general surgeon.  Have discussed with the patient and available family all diagnostics and treatments performed thus far and all questions were answered to the best of my ability. The patient demonstrates understanding and agreement with plan.       ____________________________________________   FINAL CLINICAL IMPRESSION(S) / ED DIAGNOSES  Final diagnoses:  Left leg swelling  Acute left ankle pain  Generalized abdominal pain      NEW MEDICATIONS STARTED DURING THIS VISIT:  New Prescriptions   HYDROCODONE-ACETAMINOPHEN (NORCO) 5-325 MG TABLET    Take 1 tablet by mouth every 4 (four) hours as needed for moderate pain.   POLYETHYLENE GLYCOL (MIRALAX / GLYCOLAX) PACKET    Take 17 g by mouth daily. Mix one tablespoon with 8oz of your favorite juice or water every day until you are having soft formed stools. Then start taking once daily if you didn't have a stool the day before.     Note:  This document was prepared using Dragon voice recognition software and may include unintentional dictation errors.    Willy Eddy, MD 04/17/17 [phone]

## 2017-04-21 ENCOUNTER — Ambulatory Visit: Payer: Self-pay | Admitting: Surgery

## 2017-04-22 ENCOUNTER — Ambulatory Visit (INDEPENDENT_AMBULATORY_CARE_PROVIDER_SITE_OTHER): Payer: Medicare Other | Admitting: Surgery

## 2017-04-22 ENCOUNTER — Encounter: Payer: Self-pay | Admitting: Surgery

## 2017-04-22 VITALS — BP 166/100 | HR 72 | Temp 98.7°F | Ht 65.0 in | Wt 191.0 lb

## 2017-04-22 DIAGNOSIS — Z09 Encounter for follow-up examination after completed treatment for conditions other than malignant neoplasm: Secondary | ICD-10-CM

## 2017-04-22 NOTE — Telephone Encounter (Signed)
Saw patient in clinic this am. New letter was written for patient with Return to Work date of 04/28/17. Letter and Fitness for Duty Certification faxed to [employer] at this time with positive confirmation. [phone]. This will be scanned to [employer].

## 2017-04-22 NOTE — Telephone Encounter (Signed)
Patient paid her 25.00 fee.

## 2017-04-22 NOTE — Progress Notes (Signed)
04/22/2017  HPI: Patient is s/p laparoscopic cholecystectomy and incisional hernia repair on 8/2.  She presents today for post-op follow up.  She reports that her right upper quadrant and epigastric pain have improved, but she still has diffuse lower/mid abdominal discomfort as well as nausea.  She does have pain at the incisional hernia repair site itself, but denies any new bulging at that site.  She started working again last week but felt like she overdid it as she started having more pain after that and felt much more tired.  She has not followed up with Dr. Cyndie Mull team yet about her H pylori treatment and re-checks.    Vital signs: BP (!) 166/100 Comment: Right Arm  Pulse 72   Temp 98.7 F (37.1 C) (Oral)   Ht 5\' 5"  (1.651 m)   Wt 86.6 kg (191 lb)   BMI 31.78 kg/m    Physical Exam: Constitutional: No acute distress Abdomen:  Soft, nondistended, with appropriate tenderness to palpation at the incisions.  Her incisional hernia site has the most tenderness, but there is no evidence of recurrent hernia or hernia defect.  The other incisions are healing well as well without any evidence of infection.  She has discomfort on palpation in the mid/lower abdomen, but has no peritonitis.  Assessment/Plan: 51 yo female s/p laparoscopic cholecystectomy and incisional hernia repair.  Discussed with the patient that from the surgical standpoint, she is healing well and that the pain at the prior hernia site is due to the sutures at the fascia to close the defect.  This will continue to improve.  However, this does not explain the other areas of pain and the episodes of nausea that she continues to have.  Have recommended that she follow up with Dr. Markham Jordan for further evaluation given her history of IBS and recent H pylori.    Will also extend her work note to start back next Monday 04/28/17.  No further prescriptions are needed at this point.  Patient may follow up with [place] on an as needed  basis.   Howie Ill, MD [facility]

## 2017-04-22 NOTE — Patient Instructions (Signed)
Please follow-up with Dr. Mechele Collin and Dr. Ether Griffins.  Please see your return to work note. This has been faxed to [facility].  GENERAL POST-OPERATIVE PATIENT INSTRUCTIONS   WOUND CARE INSTRUCTIONS:  Keep a dry clean dressing on the wound if there is drainage. The initial bandage may be removed after 24 hours.  Once the wound has quit draining you may leave it open to air.  If clothing rubs against the wound or causes irritation and the wound is not draining you may cover it with a dry dressing during the daytime.  Try to keep the wound dry and avoid ointments on the wound unless directed to do so.  If the wound becomes bright red and painful or starts to drain infected material that is not clear, please contact your physician immediately.  If the wound is mildly pink and has a thick firm ridge underneath it, this is normal, and is referred to as a healing ridge.  This will resolve over the next 4-6 weeks.  BATHING: You may shower if you have been informed of this by your surgeon. However, Please do not submerge in a tub, hot tub, or pool until incisions are completely sealed or have been told by your surgeon that you may do so.  DIET:  You may eat any foods that you can tolerate.  It is a good idea to eat a high fiber diet and take in plenty of fluids to prevent constipation.  If you do become constipated you may want to take a mild laxative or take ducolax tablets on a daily basis until your bowel habits are regular.  Constipation can be very uncomfortable, along with straining, after recent surgery.  ACTIVITY:  You are encouraged to cough and deep breath or use your incentive spirometer if you were given one, every 15-30 minutes when awake.  This will help prevent respiratory complications and low grade fevers post-operatively if you had a general anesthetic.  You may want to hug a pillow when coughing and sneezing to add additional support to the surgical area, if you had abdominal or chest surgery, which  will decrease pain during these times.  You are encouraged to walk and engage in light activity for the next two weeks.  You should not lift more than 20 pounds, until 05/01/2017 as it could put you at increased risk for complications.  Twenty pounds is roughly equivalent to a plastic bag of groceries. At that time- Listen to your body when lifting, if you have pain when lifting, stop and then try again in a few days. Soreness after doing exercises or activities of daily living is normal as you get back in to your normal routine.  MEDICATIONS:  Try to take narcotic medications and anti-inflammatory medications, such as tylenol, ibuprofen, naprosyn, etc., with food.  This will minimize stomach upset from the medication.  Should you develop nausea and vomiting from the pain medication, or develop a rash, please discontinue the medication and contact your physician.  You should not drive, make important decisions, or operate machinery when taking narcotic pain medication.  SUNBLOCK Use sun block to incision area over the next year if this area will be exposed to sun. This helps decrease scarring and will allow you avoid a permanent darkened area over your incision.  QUESTIONS:  Please feel free to call our office if you have any questions, and we will be glad to assist you. [phone]

## 2017-05-07 ENCOUNTER — Encounter: Payer: Self-pay | Admitting: Podiatry

## 2017-06-02 NOTE — Progress Notes (Signed)
This encounter was created in error - please disregard.

## 2017-06-03 ENCOUNTER — Inpatient Hospital Stay
Admission: AD | Admit: 2017-06-03 | Discharge: 2017-06-06 | DRG: 203 | Disposition: A | Discharge status: Home / Self Care | Payer: Medicare Other | Source: Ambulatory Visit | Attending: Internal Medicine | Admitting: Internal Medicine

## 2017-06-03 ENCOUNTER — Inpatient Hospital Stay: Payer: Medicare Other

## 2017-06-03 DIAGNOSIS — K3184 Gastroparesis: Secondary | ICD-10-CM | POA: Diagnosis present

## 2017-06-03 DIAGNOSIS — J189 Pneumonia, unspecified organism: Secondary | ICD-10-CM | POA: Diagnosis present

## 2017-06-03 DIAGNOSIS — M1990 Unspecified osteoarthritis, unspecified site: Secondary | ICD-10-CM | POA: Diagnosis not present

## 2017-06-03 DIAGNOSIS — J209 Acute bronchitis, unspecified: Principal | ICD-10-CM | POA: Diagnosis present

## 2017-06-03 DIAGNOSIS — Z880 Allergy status to penicillin: Secondary | ICD-10-CM | POA: Diagnosis not present

## 2017-06-03 DIAGNOSIS — F329 Major depressive disorder, single episode, unspecified: Secondary | ICD-10-CM | POA: Diagnosis present

## 2017-06-03 DIAGNOSIS — J45909 Unspecified asthma, uncomplicated: Secondary | ICD-10-CM | POA: Diagnosis present

## 2017-06-03 DIAGNOSIS — Z8619 Personal history of other infectious and parasitic diseases: Secondary | ICD-10-CM

## 2017-06-03 DIAGNOSIS — Z23 Encounter for immunization: Secondary | ICD-10-CM

## 2017-06-03 DIAGNOSIS — I10 Essential (primary) hypertension: Secondary | ICD-10-CM | POA: Diagnosis not present

## 2017-06-03 DIAGNOSIS — Z79899 Other long term (current) drug therapy: Secondary | ICD-10-CM

## 2017-06-03 DIAGNOSIS — I4891 Unspecified atrial fibrillation: Secondary | ICD-10-CM | POA: Diagnosis not present

## 2017-06-03 DIAGNOSIS — E1143 Type 2 diabetes mellitus with diabetic autonomic (poly)neuropathy: Secondary | ICD-10-CM | POA: Diagnosis not present

## 2017-06-03 DIAGNOSIS — G43909 Migraine, unspecified, not intractable, without status migrainosus: Secondary | ICD-10-CM | POA: Diagnosis present

## 2017-06-03 DIAGNOSIS — Z96651 Presence of right artificial knee joint: Secondary | ICD-10-CM | POA: Diagnosis not present

## 2017-06-03 DIAGNOSIS — Z888 Allergy status to other drugs, medicaments and biological substances status: Secondary | ICD-10-CM | POA: Diagnosis not present

## 2017-06-03 DIAGNOSIS — Z7951 Long term (current) use of inhaled steroids: Secondary | ICD-10-CM | POA: Diagnosis not present

## 2017-06-03 DIAGNOSIS — M797 Fibromyalgia: Secondary | ICD-10-CM | POA: Diagnosis not present

## 2017-06-03 DIAGNOSIS — R197 Diarrhea, unspecified: Secondary | ICD-10-CM | POA: Diagnosis present

## 2017-06-03 DIAGNOSIS — Z794 Long term (current) use of insulin: Secondary | ICD-10-CM

## 2017-06-03 DIAGNOSIS — E860 Dehydration: Secondary | ICD-10-CM | POA: Diagnosis present

## 2017-06-03 DIAGNOSIS — G4730 Sleep apnea, unspecified: Secondary | ICD-10-CM | POA: Diagnosis present

## 2017-06-03 DIAGNOSIS — K219 Gastro-esophageal reflux disease without esophagitis: Secondary | ICD-10-CM | POA: Diagnosis present

## 2017-06-03 LAB — CBC
HCT: 37.9 % (ref 35.0–47.0)
Hemoglobin: 12.7 g/dL (ref 12.0–16.0)
MCH: 30.2 pg (ref 26.0–34.0)
MCHC: 33.6 g/dL (ref 32.0–36.0)
MCV: 89.9 fL (ref 80.0–100.0)
Platelets: 272 10*3/uL (ref 150–440)
RBC: 4.22 MIL/uL (ref 3.80–5.20)
RDW: 14.6 % — ABNORMAL HIGH (ref 11.5–14.5)
WBC: 4.5 10*3/uL (ref 3.6–11.0)

## 2017-06-03 LAB — COMPREHENSIVE METABOLIC PANEL
ALT: 24 U/L (ref 14–54)
AST: 22 U/L (ref 15–41)
Albumin: 4.1 g/dL (ref 3.5–5.0)
Alkaline Phosphatase: 88 U/L (ref 38–126)
Anion gap: 6 (ref 5–15)
BUN: 9 mg/dL (ref 6–20)
CO2: 30 mmol/L (ref 22–32)
Calcium: 9.7 mg/dL (ref 8.9–10.3)
Chloride: 105 mmol/L (ref 101–111)
Creatinine, Ser: 0.8 mg/dL (ref 0.44–1.00)
GFR calc Af Amer: >60 mL/min (ref >60)
GFR calc non Af Amer: >60 mL/min (ref >60)
Glucose, Bld: 107 mg/dL — ABNORMAL HIGH (ref 65–99)
Potassium: 4.1 mmol/L (ref 3.5–5.1)
Sodium: 141 mmol/L (ref 135–145)
Total Bilirubin: 0.3 mg/dL (ref 0.3–1.2)
Total Protein: 7.7 g/dL (ref 6.5–8.1)

## 2017-06-03 LAB — GLUCOSE, CAPILLARY
Glucose-Capillary: 90 mg/dL (ref 65–99)
Glucose-Capillary: 99 mg/dL (ref 65–99)

## 2017-06-03 MED ORDER — ONDANSETRON HCL 4 MG/2ML IJ SOLN
4.0000 mg | Freq: Four times a day (QID) | INTRAMUSCULAR | Status: DC | PRN
  Administered 2017-06-03 – 2017-06-04 (×2): 4 mg via INTRAVENOUS
  Filled 2017-06-03 (×2): qty 2

## 2017-06-03 MED ORDER — POLYETHYLENE GLYCOL 3350 17 G PO PACK
17.0000 g | PACK | Freq: Every day | ORAL | Status: DC
  Administered 2017-06-03 – 2017-06-06 (×4): 17 g via ORAL
  Filled 2017-06-03 (×4): qty 1

## 2017-06-03 MED ORDER — OXYCODONE HCL 5 MG PO TABS
5.0000 mg | ORAL_TABLET | Freq: Four times a day (QID) | ORAL | Status: DC | PRN
  Administered 2017-06-03 – 2017-06-04 (×2): 5 mg via ORAL
  Filled 2017-06-03 (×3): qty 1

## 2017-06-03 MED ORDER — VITAMIN C 500 MG PO TABS
1000.0000 mg | ORAL_TABLET | Freq: Every day | ORAL | Status: DC
  Administered 2017-06-03 – 2017-06-06 (×4): 1000 mg via ORAL
  Filled 2017-06-03 (×4): qty 2

## 2017-06-03 MED ORDER — SODIUM CHLORIDE 0.9 % IV SOLN
INTRAVENOUS | Status: DC
  Administered 2017-06-03 – 2017-06-05 (×3): via INTRAVENOUS

## 2017-06-03 MED ORDER — LEVOFLOXACIN IN D5W 750 MG/150ML IV SOLN
750.0000 mg | Freq: Once | INTRAVENOUS | Status: AC
  Administered 2017-06-03: 18:00:00 750 mg via INTRAVENOUS
  Filled 2017-06-03: qty 150

## 2017-06-03 MED ORDER — ENOXAPARIN SODIUM 40 MG/0.4ML ~~LOC~~ SOLN: 40.0000 mg | SUBCUTANEOUS | Status: DC

## 2017-06-03 MED ORDER — MONTELUKAST SODIUM 10 MG PO TABS
10.0000 mg | ORAL_TABLET | Freq: Every day | ORAL | Status: DC
  Administered 2017-06-03 – 2017-06-06 (×4): 10 mg via ORAL
  Filled 2017-06-03 (×4): qty 1

## 2017-06-03 MED ORDER — LEVOFLOXACIN IN D5W 750 MG/150ML IV SOLN
750.0000 mg | INTRAVENOUS | Status: DC
  Administered 2017-06-04 – 2017-06-05 (×2): 750 mg via INTRAVENOUS
  Filled 2017-06-03 (×3): qty 150

## 2017-06-03 MED ORDER — FLEET ENEMA 7-19 GM/118ML RE ENEM: 1.0000 | ENEMA | Freq: Once | RECTAL | Status: DC | PRN

## 2017-06-03 MED ORDER — INSULIN ASPART 100 UNIT/ML ~~LOC~~ SOLN: 0.0000 [IU] | Freq: Three times a day (TID) | SUBCUTANEOUS | Status: DC

## 2017-06-03 MED ORDER — DILTIAZEM HCL ER 240 MG PO CP24
240.0000 mg | ORAL_CAPSULE | Freq: Every day | ORAL | Status: DC
  Administered 2017-06-03: 18:00:00 240 mg via ORAL
  Filled 2017-06-03 (×2): qty 1

## 2017-06-03 MED ORDER — INFLUENZA VAC SPLIT QUAD 0.5 ML IM SUSY
0.5000 mL | PREFILLED_SYRINGE | INTRAMUSCULAR | Status: AC
  Administered 2017-06-05: 0.5 mL via INTRAMUSCULAR
  Filled 2017-06-03: qty 0.5

## 2017-06-03 MED ORDER — SUMATRIPTAN SUCCINATE 50 MG PO TABS
25.0000 mg | ORAL_TABLET | ORAL | Status: DC | PRN
  Administered 2017-06-03 (×2): 25 mg via ORAL
  Filled 2017-06-03 (×3): qty 1

## 2017-06-03 MED ORDER — TIZANIDINE HCL 4 MG PO TABS
4.0000 mg | ORAL_TABLET | Freq: Four times a day (QID) | ORAL | Status: DC | PRN
  Administered 2017-06-03 – 2017-06-05 (×4): 4 mg via ORAL
  Filled 2017-06-03 (×5): qty 1

## 2017-06-03 MED ORDER — SUCRALFATE 1 G PO TABS
1.0000 g | ORAL_TABLET | Freq: Three times a day (TID) | ORAL | Status: DC
  Administered 2017-06-03 – 2017-06-06 (×11): 1 g via ORAL
  Filled 2017-06-03 (×11): qty 1

## 2017-06-03 MED ORDER — ONDANSETRON 4 MG PO TBDP: 4.0000 mg | ORAL_TABLET | Freq: Three times a day (TID) | ORAL | Status: DC | PRN

## 2017-06-03 MED ORDER — ONDANSETRON HCL 4 MG PO TABS: 4.0000 mg | ORAL_TABLET | Freq: Four times a day (QID) | ORAL | Status: DC | PRN

## 2017-06-03 MED ORDER — ADULT MULTIVITAMIN W/MINERALS CH
1.0000 | ORAL_TABLET | Freq: Every day | ORAL | Status: DC
  Administered 2017-06-03 – 2017-06-06 (×4): 1 via ORAL
  Filled 2017-06-03 (×4): qty 1

## 2017-06-03 MED ORDER — PREGABALIN 75 MG PO CAPS
150.0000 mg | ORAL_CAPSULE | Freq: Three times a day (TID) | ORAL | Status: DC
  Administered 2017-06-03 – 2017-06-06 (×8): 150 mg via ORAL
  Filled 2017-06-03 (×8): qty 2

## 2017-06-03 MED ORDER — ZOLPIDEM TARTRATE 5 MG PO TABS
5.0000 mg | ORAL_TABLET | Freq: Every day | ORAL | Status: DC
  Administered 2017-06-03 – 2017-06-05 (×3): 5 mg via ORAL
  Filled 2017-06-03 (×3): qty 1

## 2017-06-03 MED ORDER — PNEUMOCOCCAL VAC POLYVALENT 25 MCG/0.5ML IJ INJ
0.5000 mL | INJECTION | INTRAMUSCULAR | Status: AC
  Administered 2017-06-05: 0.5 mL via INTRAMUSCULAR
  Filled 2017-06-03: qty 0.5

## 2017-06-03 MED ORDER — GLUCOSAMINE-CHONDROITIN 500-400 MG PO TABS: 1.0000 | ORAL_TABLET | Freq: Every day | ORAL | Status: DC

## 2017-06-03 MED ORDER — FLUTICASONE FUROATE-VILANTEROL 200-25 MCG/INH IN AEPB
1.0000 | INHALATION_SPRAY | Freq: Every day | RESPIRATORY_TRACT | Status: DC
  Administered 2017-06-04 – 2017-06-06 (×3): 1 via RESPIRATORY_TRACT
  Filled 2017-06-03: qty 28

## 2017-06-03 MED ORDER — DULOXETINE HCL 30 MG PO CPEP
60.0000 mg | ORAL_CAPSULE | Freq: Every day | ORAL | Status: DC
  Administered 2017-06-03 – 2017-06-06 (×4): 60 mg via ORAL
  Filled 2017-06-03 (×4): qty 2

## 2017-06-03 MED ORDER — LOSARTAN POTASSIUM 25 MG PO TABS
25.0000 mg | ORAL_TABLET | Freq: Every day | ORAL | Status: DC
  Administered 2017-06-03: 25 mg via ORAL
  Filled 2017-06-03 (×2): qty 1

## 2017-06-03 MED ORDER — ACETAMINOPHEN 325 MG PO TABS
650.0000 mg | ORAL_TABLET | Freq: Four times a day (QID) | ORAL | Status: DC | PRN
  Administered 2017-06-03 – 2017-06-05 (×4): 650 mg via ORAL
  Filled 2017-06-03 (×4): qty 2

## 2017-06-03 MED ORDER — PREMIER PROTEIN SHAKE
11.0000 [oz_av] | Freq: Three times a day (TID) | ORAL | Status: DC
  Administered 2017-06-04 (×3): 11 [oz_av] via ORAL

## 2017-06-03 MED ORDER — METOPROLOL SUCCINATE ER 50 MG PO TB24
50.0000 mg | ORAL_TABLET | Freq: Every day | ORAL | Status: DC
  Administered 2017-06-03: 18:00:00 50 mg via ORAL
  Filled 2017-06-03 (×2): qty 1

## 2017-06-03 MED ORDER — DEXMETHYLPHENIDATE HCL 5 MG PO TABS
10.0000 mg | ORAL_TABLET | Freq: Two times a day (BID) | ORAL | Status: DC
  Administered 2017-06-06: 10:00:00 10 mg via ORAL
  Filled 2017-06-03 (×2): qty 2

## 2017-06-03 MED ORDER — LINACLOTIDE 290 MCG PO CAPS
290.0000 ug | ORAL_CAPSULE | Freq: Every day | ORAL | Status: DC
  Administered 2017-06-03 – 2017-06-06 (×4): 290 ug via ORAL
  Filled 2017-06-03 (×5): qty 1

## 2017-06-03 MED ORDER — PANTOPRAZOLE SODIUM 40 MG PO TBEC
40.0000 mg | DELAYED_RELEASE_TABLET | Freq: Every day | ORAL | Status: DC
  Administered 2017-06-03 – 2017-06-06 (×4): 40 mg via ORAL
  Filled 2017-06-03 (×4): qty 1

## 2017-06-03 MED ORDER — ACETAMINOPHEN 650 MG RE SUPP: 650.0000 mg | Freq: Four times a day (QID) | RECTAL | Status: DC | PRN

## 2017-06-03 MED ORDER — BIOTIN 2.5 MG PO TABS: ORAL_TABLET | Freq: Every day | ORAL | Status: DC

## 2017-06-03 MED ORDER — ALPRAZOLAM 1 MG PO TABS
1.0000 mg | ORAL_TABLET | Freq: Every evening | ORAL | Status: DC | PRN
  Administered 2017-06-03 – 2017-06-05 (×2): 1 mg via ORAL
  Filled 2017-06-03 (×2): qty 1

## 2017-06-03 MED ORDER — ENOXAPARIN SODIUM 40 MG/0.4ML ~~LOC~~ SOLN
40.0000 mg | SUBCUTANEOUS | Status: DC
  Administered 2017-06-03 – 2017-06-05 (×3): 40 mg via SUBCUTANEOUS
  Filled 2017-06-03 (×3): qty 0.4

## 2017-06-03 MED ORDER — ISOSORBIDE MONONITRATE ER 30 MG PO TB24
30.0000 mg | ORAL_TABLET | Freq: Every day | ORAL | Status: DC
  Administered 2017-06-03: 30 mg via ORAL
  Filled 2017-06-03 (×2): qty 1

## 2017-06-03 MED ORDER — ALBUTEROL SULFATE (2.5 MG/3ML) 0.083% IN NEBU: 2.5000 mg | INHALATION_SOLUTION | RESPIRATORY_TRACT | Status: DC | PRN

## 2017-06-03 MED ORDER — INSULIN ASPART 100 UNIT/ML ~~LOC~~ SOLN
0.0000 [IU] | Freq: Three times a day (TID) | SUBCUTANEOUS | Status: DC
  Administered 2017-06-04: 09:00:00 1 [IU] via SUBCUTANEOUS
  Administered 2017-06-05: 3 [IU] via SUBCUTANEOUS
  Administered 2017-06-05 – 2017-06-06 (×2): 1 [IU] via SUBCUTANEOUS
  Filled 2017-06-03 (×4): qty 1

## 2017-06-03 NOTE — H&P (Signed)
[facility] [place] [facility]   PATIENT NAME: Amanda [name]    MR#:  [id number]  DATE OF BIRTH:  03-Feb-1966  DATE OF ADMISSION:  06/03/2017  PRIMARY CARE PHYSICIAN: Margaretann Loveless, MD   REQUESTING/REFERRING PHYSICIAN: dr [name]  CHIEF COMPLAINT:   Sob  HISTORY OF PRESENT ILLNESS:  Amanda [name]  is a 51 y.o. female with a known history of Diabetes, fibromyalgia and recent cholecystectomy who presented to primary care physician's office with chief complaint of nausea, vomiting, diarrhea, shortness of breath, cough and productive sputum over the past week. She denies sick contacts. She does state that she has had on and off fever and chills. Her diarrhea is improving. She is having 2-3 loose bowel movements a day patient denies melena or hematochezia. She is also complaining of productive sputum with cough.  She has not eaten in the past week per her report.  Her primary care physician was worried that she has a developing pneumonia and patient was directly admitted for pneumonia.  PAST MEDICAL HISTORY:   Past Medical History:  Diagnosis Date  . Arthritis    knees, shoulder, upper back  . Asthma   . Diabetes mellitus without complication (HCC)   . Environmental allergies   . Fibromyalgia   . GERD (gastroesophageal reflux disease)   . Headache    migraines - 5x/mo  . Hypertension   . Migraines   . Motion sickness    ships  . Sleep apnea   . Thyroid nodule    bilateral and goiter  . Wears contact lenses   . Wears dentures    partial upper    PAST SURGICAL HISTORY:   Past Surgical History:  Procedure Laterality Date  . ABDOMINAL HYSTERECTOMY  2000's  . ABDOMINAL SURGERY     laparoscopy x4 with lysis of adhesions  . APPENDECTOMY  1986  . CESAREAN SECTION  1992  . CHOLECYSTECTOMY N/A 04/03/2017   Procedure: LAPAROSCOPIC CHOLECYSTECTOMY;  Surgeon: Henrene Dodge, MD;  Location: [facility] ORS;  Service: General;  Laterality: N/A;  . COLONOSCOPY WITH  PROPOFOL N/A 03/10/2017   Procedure: COLONOSCOPY WITH PROPOFOL;  Surgeon: Scot Jun, MD;  Location: [facility] ENDOSCOPY;  Service: Endoscopy;  Laterality: N/A;  . ECTOPIC PREGNANCY SURGERY  2000's  . ESOPHAGOGASTRODUODENOSCOPY (EGD) WITH PROPOFOL N/A 03/10/2017   Procedure: ESOPHAGOGASTRODUODENOSCOPY (EGD) WITH PROPOFOL;  Surgeon: Scot Jun, MD;  Location: [facility] ENDOSCOPY;  Service: Endoscopy;  Laterality: N/A;  . HERNIA REPAIR    . JOINT REPLACEMENT Right 12/16/12   knee- medial - makoplasty  . KNEE ARTHROSCOPY Right 2006   partial medial and lateral meniscectomies  . KNEE ARTHROSCOPY Right 10/06/2015   Procedure: RIGHT KNEE ARTHROSCOPY WITH DEBRIDEMENT;  Surgeon: Erin Sons, MD;  Location: [facility];  Service: Orthopedics;  Laterality: Right;  Diabetic - insulin and oral meds  . ROTATOR CUFF REPAIR Left 2006  . TUBAL LIGATION  2000's    SOCIAL HISTORY:   Social History  Substance Use Topics  . Smoking status: Never Smoker  . Smokeless tobacco: Never Used  . Alcohol use No    FAMILY HISTORY:   Family History  Problem Relation Age of Onset  . Hypertension Mother   . Hypertension Father   . Diabetes Father   . Allergies Father     DRUG ALLERGIES:   Allergies  Allergen Reactions  . Penicillins Anaphylaxis    Has patient had a PCN reaction causing immediate rash, facial/tongue/throat swelling, SOB or lightheadedness with  hypotension: Yes Has patient had a PCN reaction causing severe rash involving mucus membranes or skin necrosis: No Has patient had a PCN reaction that required hospitalization No Has patient had a PCN reaction occurring within the last 10 years: No If all of the above answers are "NO", then may proceed with Cephalosporin use.   . Ace Inhibitors Swelling    REVIEW OF SYSTEMS:   Review of Systems  Constitutional: Positive for chills, fever and malaise/fatigue.  HENT: Negative.  Negative for ear discharge, ear pain, hearing loss,  nosebleeds and sore throat.   Eyes: Negative.  Negative for blurred vision and pain.  Respiratory: Positive for cough, sputum production and shortness of breath. Negative for hemoptysis and wheezing.   Cardiovascular: Negative.  Negative for chest pain, palpitations and leg swelling.  Gastrointestinal: Positive for diarrhea, nausea and vomiting. Negative for abdominal pain and blood in stool.  Genitourinary: Negative.  Negative for dysuria.  Musculoskeletal: Negative.  Negative for back pain.  Skin: Negative.   Neurological: Positive for weakness. Negative for dizziness, tremors, speech change, focal weakness, seizures and headaches.  Endo/Heme/Allergies: Negative.  Does not bruise/bleed easily.  Psychiatric/Behavioral: Negative.  Negative for depression, hallucinations and suicidal ideas.    MEDICATIONS AT HOME:   Prior to Admission medications   Medication Sig Start Date End Date Taking? Authorizing Provider  albuterol (PROVENTIL HFA) 108 (90 Base) MCG/ACT inhaler Inhale 2 puffs into the lungs every 4 (four) hours as needed for wheezing or shortness of breath. 04/16/17   Shane Crutch, MD  ALPRAZolam Prudy Feeler) 1 MG tablet Take 1 mg by mouth at bedtime as needed for anxiety or sleep.    [provider]  ARIPiprazole (ABILIFY) 10 MG tablet Take 10 mg by mouth daily as needed.    [provider]  Ascorbic Acid (VITAMIN C) 1000 MG tablet Take 1,000 mg by mouth daily.    [provider]  BIOTIN PO Take 1 tablet by mouth daily.     [provider]  canagliflozin (INVOKANA) 300 MG TABS tablet Take 300 mg by mouth daily before breakfast.    [provider]  cycloSPORINE (RESTASIS) 0.05 % ophthalmic emulsion 1 drop 2 (two) times daily.    [provider]  dexmethylphenidate (FOCALIN) 10 MG tablet Take 10 mg by mouth 2 (two) times daily. 08/16/16   [provider]  Diclofenac Sodium 3 % GEL Place 1 application onto the skin every 12  (twelve) hours as needed. 10/08/16   Schaevitz, Myra Rude, MD  diltiazem (DILACOR XR) 240 MG 24 hr capsule Take 240 mg by mouth daily.    [provider]  DULoxetine (CYMBALTA) 60 MG capsule Take 60 mg by mouth daily. AM    [provider]  flunisolide (NASALIDE) 25 MCG/ACT (0.025%) SOLN Place 2 sprays into the nose daily as needed.    [provider]  fluticasone furoate-vilanterol (BREO ELLIPTA) 200-25 MCG/INH AEPB Inhale 1 puff into the lungs daily. 04/16/17   Shane Crutch, MD  furosemide (LASIX) 20 MG tablet Take 30 mg by mouth daily.     [provider]  glucosamine-chondroitin 500-400 MG tablet Take 1 tablet by mouth daily.     [provider]  HYDROcodone-acetaminophen (NORCO) 5-325 MG tablet Take 1 tablet by mouth every 4 (four) hours as needed for moderate pain. 04/17/17   Willy Eddy, MD  ibuprofen (ADVIL,MOTRIN) 600 MG tablet Take 1 tablet (600 mg total) by mouth every 8 (eight) hours as needed for mild pain  or moderate pain. 04/05/17   Piscoya, Elita Quick, MD  insulin detemir (LEVEMIR) 100 UNIT/ML injection Inject 10 Units into the skin at bedtime.    [provider]  ipratropium-albuterol (DUONEB) 0.5-2.5 (3) MG/3ML SOLN Take 3 mLs by nebulization every 4 (four) hours as needed.    [provider]  isosorbide mononitrate (IMDUR) 30 MG 24 hr tablet Take 30 mg by mouth daily.    [provider]  LINZESS 290 MCG CAPS capsule Take 1 tablet by mouth daily. 02/28/17   [provider]  losartan (COZAAR) 25 MG tablet Take 25 mg by mouth daily.    [provider]  metFORMIN (GLUCOPHAGE) 500 MG tablet Take 1,000 mg by mouth 2 (two) times daily with a meal.     [provider]  metoprolol succinate (TOPROL-XL) 50 MG 24 hr tablet Take 50 mg by mouth daily. Take with or immediately following a meal.    [provider]  mirtazapine (REMERON) 30 MG tablet Take 30 mg by mouth at bedtime as  needed.     [provider]  montelukast (SINGULAIR) 10 MG tablet Take 1 tablet (10 mg total) by mouth daily. 04/16/17 04/16/18  Shane Crutch, MD  Multiple Vitamin (MULTIVITAMIN) tablet Take 1 tablet by mouth daily.    [provider]  omeprazole (PRILOSEC) 40 MG capsule Take 1 capsule (40 mg total) by mouth daily. 1 hr before supper 04/05/17   Piscoya, Elita Quick, MD  ondansetron (ZOFRAN ODT) 4 MG disintegrating tablet Take 1 tablet (4 mg total) by mouth every 8 (eight) hours as needed for nausea or vomiting. 04/05/17   Henrene Dodge, MD  oxyCODONE (OXY IR/ROXICODONE) 5 MG immediate release tablet Take 1-2 tablets (5-10 mg total) by mouth every 6 (six) hours as needed for severe pain. 04/05/17   Piscoya, Elita Quick, MD  polyethylene glycol (MIRALAX / GLYCOLAX) packet Take 17 g by mouth daily. Mix one tablespoon with 8oz of your favorite juice or water every day until you are having soft formed stools. Then start taking once daily if you didn't have a stool the day before. 04/17/17   Willy Eddy, MD  pregabalin (LYRICA) 150 MG capsule Take 150 mg by mouth 3 (three) times daily.     [provider]  protein supplement shake (PREMIER PROTEIN) LIQD Take 325 mLs (11 oz total) by mouth 3 (three) times daily between meals. 04/05/17   Henrene Dodge, MD  sucralfate (CARAFATE) 1 g tablet Take 1 g by mouth 4 (four) times daily -  with meals and at bedtime.    [provider]  SUMAtriptan (IMITREX) 25 MG tablet Take 25 mg by mouth every 2 (two) hours as needed for migraine. May repeat in 2 hours if headache persists or recurs.    [provider]  tiZANidine (ZANAFLEX) 4 MG tablet Take 4 mg by mouth every 6 (six) hours as needed for muscle spasms.    [provider]  zolpidem (AMBIEN) 5 MG tablet Take 5 mg by mouth at bedtime.     [provider]      VITAL SIGNS:  Blood pressure 135/78, pulse 66, temperature 97.6 F (36.4 C), temperature source Oral,  resp. rate 16, SpO2 100 %.  PHYSICAL EXAMINATION:   Physical Exam  Constitutional: She is oriented to person, place, and time and well-developed, well-nourished, and in no distress. No distress.  HENT:  Head: Normocephalic.  Eyes: No scleral icterus.  Neck: Normal range of motion. Neck supple. No JVD present.  No tracheal deviation present.  Cardiovascular: Normal rate, regular rhythm and normal heart sounds.  Exam reveals no gallop and no friction rub.   No murmur heard. Pulmonary/Chest: Effort normal and breath sounds normal. No respiratory distress. She has no wheezes. She has no rales. She exhibits no tenderness.  Abdominal: Soft. Bowel sounds are normal. She exhibits no distension and no mass. There is no tenderness. There is no rebound and no guarding.  Musculoskeletal: Normal range of motion. She exhibits no edema.  Neurological: She is alert and oriented to person, place, and time.  Skin: Skin is warm. No rash noted. No erythema.  Psychiatric: Affect and judgment normal.      LABORATORY PANEL:   CBC No results for input(s): WBC, HGB, HCT, PLT in the last 168 hours. ------------------------------------------------------------------------------------------------------------------  Chemistries  No results for input(s): NA, K, CL, CO2, GLUCOSE, BUN, CREATININE, CALCIUM, MG, AST, ALT, ALKPHOS, BILITOT in the last 168 hours.  Invalid input(s): GFRCGP ------------------------------------------------------------------------------------------------------------------  Cardiac Enzymes No results for input(s): TROPONINI in the last 168 hours. ------------------------------------------------------------------------------------------------------------------  RADIOLOGY:  No results found.  EKG:  None  IMPRESSION AND PLAN:   51 year old female with history of diabetes who presents with nausea, vomiting, diarrhea, shortness of breath, cough and productive sputum.  1.  Pneumonia: Chest x-ray is pending Start Levaquin  2. Nausea, vomiting and diarrhea Checked GI panel Check H. pylori panel as patient has history of H. Pylori Supportive management Laboratories have been ordered.  3. Diabetes: Sliding scale for now due to poor by mouth intake Diabetes coronary consultation requested.   4. Essential hypertension: Continue diltiazem and losartan  5. Depression/fibromyalgia: Continue Cymbalta   All the records are reviewed and case discussed with ED provider. Management plans discussed with the patient and she is in agreement  CODE STATUS: full  TOTAL TIME TAKING CARE OF THIS PATIENT: 41 minutes.    Dawnyel Leven M.D on 06/03/2017 at 5:33 PM  Between 7am to 6pm - Pager - [phone]  After 6pm go to [email or web address] - password [email or web address]  [facility]  Office  [phone]  CC: Primary care physician; Margaretann Loveless, MD

## 2017-06-03 NOTE — Progress Notes (Signed)
PHARMACIST - PHYSICIAN ORDER COMMUNICATION  CONCERNING: P&T Medication Policy on Herbal Medications  DESCRIPTION:  This patient's order for:  Biotin, Glucoasmine-chondroitin  has been noted.  This product(s) is classified as an "herbal" or natural product. Due to a lack of definitive safety studies or FDA approval, nonstandard manufacturing practices, plus the potential risk of unknown drug-drug interactions while on inpatient medications, the Pharmacy and Therapeutics Committee does not permit the use of "herbal" or natural products of this type within [facility].   ACTION TAKEN: The pharmacy department is unable to verify this order at this time and your patient has been informed of this safety policy. Please reevaluate patient's clinical condition at discharge and address if the herbal or natural product(s) should be resumed at that time.

## 2017-06-03 NOTE — Progress Notes (Signed)
Pharmacy Antibiotic Note  Amanda [name] is a 51 y.o. female admitted on 06/03/2017 with PNA.  Pharmacy has been consulted for levofloxacin dosing.  Plan: Levofloxacin 750 mg IV x 1. Further doses require updated height/weight and SCr result.      No data recorded.  No results for input(s): WBC, CREATININE, LATICACIDVEN, VANCOTROUGH, VANCOPEAK, VANCORANDOM, GENTTROUGH, GENTPEAK, GENTRANDOM, TOBRATROUGH, TOBRAPEAK, TOBRARND, AMIKACINPEAK, AMIKACINTROU, AMIKACIN in the last 168 hours.  CrCl cannot be calculated (Patient's most recent lab result is older than the maximum 21 days allowed.).    Allergies  Allergen Reactions  . Penicillins Anaphylaxis    Has patient had a PCN reaction causing immediate rash, facial/tongue/throat swelling, SOB or lightheadedness with hypotension: Yes Has patient had a PCN reaction causing severe rash involving mucus membranes or skin necrosis: No Has patient had a PCN reaction that required hospitalization No Has patient had a PCN reaction occurring within the last 10 years: No If all of the above answers are "NO", then may proceed with Cephalosporin use.   . Ace Inhibitors Swelling    Thank you for allowing pharmacy to be a part of this patient's care.  Carola Frost, Pharm.D., BCPS Clinical Pharmacist 06/03/2017 4:36 PM

## 2017-06-04 DIAGNOSIS — J209 Acute bronchitis, unspecified: Secondary | ICD-10-CM | POA: Diagnosis not present

## 2017-06-04 DIAGNOSIS — J189 Pneumonia, unspecified organism: Secondary | ICD-10-CM | POA: Diagnosis not present

## 2017-06-04 LAB — GLUCOSE, CAPILLARY
Glucose-Capillary: 122 mg/dL — ABNORMAL HIGH (ref 65–99)
Glucose-Capillary: 116 mg/dL — ABNORMAL HIGH (ref 65–99)
Glucose-Capillary: 91 mg/dL (ref 65–99)
Glucose-Capillary: 106 mg/dL — ABNORMAL HIGH (ref 65–99)

## 2017-06-04 LAB — BASIC METABOLIC PANEL
Anion gap: 5 (ref 5–15)
BUN: 12 mg/dL (ref 6–20)
CO2: 29 mmol/L (ref 22–32)
Calcium: 8.9 mg/dL (ref 8.9–10.3)
Chloride: 106 mmol/L (ref 101–111)
Creatinine, Ser: 0.78 mg/dL (ref 0.44–1.00)
GFR calc Af Amer: >60 mL/min (ref >60)
GFR calc non Af Amer: >60 mL/min (ref >60)
Glucose, Bld: 103 mg/dL — ABNORMAL HIGH (ref 65–99)
Potassium: 3.9 mmol/L (ref 3.5–5.1)
Sodium: 140 mmol/L (ref 135–145)

## 2017-06-04 MED ORDER — ISOSORBIDE MONONITRATE ER 30 MG PO TB24
15.0000 mg | ORAL_TABLET | Freq: Every day | ORAL | Status: DC
  Administered 2017-06-05 – 2017-06-06 (×2): 15 mg via ORAL
  Filled 2017-06-04 (×3): qty 1

## 2017-06-04 MED ORDER — ENSURE ENLIVE PO LIQD
237.0000 mL | Freq: Three times a day (TID) | ORAL | Status: DC
  Administered 2017-06-04 – 2017-06-06 (×5): 237 mL via ORAL

## 2017-06-04 NOTE — Progress Notes (Signed)
Initial Nutrition Assessment  DOCUMENTATION CODES:   Obesity unspecified  INTERVENTION:  Will discontinue Premier Protein.  Provide Ensure Enlive po TID, each supplement provides 350 kcal and 20 grams of protein.   Continue MVI daily. Needs to be provided 2 hours apart from Carafate.  NUTRITION DIAGNOSIS:   Inadequate oral intake related to poor appetite, nausea, vomiting, other (see comment) (recent hx of H. Pylori infection on 7/9, recent cholecystectomy 8/2) as evidenced by per patient/family report.  GOAL:   Patient will meet greater than or equal to 90% of their needs  MONITOR:   PO intake, Supplement acceptance, Labs, Weight trends, I & O's  REASON FOR ASSESSMENT:   Malnutrition Screening Tool    ASSESSMENT:   51 year old female with PMHx of DM type 2, arthritis, thyroid nodule, GERD, fibromyalgia, asthma, migraines, HTN, sleep apnea, recent cholecystectomy on 04/03/2017 who presented with N/V, diarrhea, SOB, cough, productive sputum admitted with acute bronchitis. Recently found to have H. Pylori on 03/10/2017, now s/p treatment.   Spoke with patient at bedside. She was lethargic, but able to provide some history. She reports she has not been feeling well since her gallbladder surgery two months ago, but has been feeling even worse for the past week. She reports that for the past few months she has only been eating one small meal per day, which is actually only a Premier Protein shake (only 160 kcal, 30 grams of protein). She has not been attempting to eat food at home per her report. She does drink water during the day. She reports she is under a significant amount of stress at work, which is contributing to her poor appetite and intake. So far today patient has had two bottles of Premier Protein, some chips, yogurt, and pudding.  UBW 198 lbs. Patient was 192.4 lbs on 04/02/2017. She has lost 5.6 lbs (2.9% body weight) over the past 2 months, which is not significant for time  frame.  Meal Completion: 50%  Medications reviewed and include: Novolog 0-9 units TID, MVI daily, pantoprazole, Miralax 17 grams daily, Carafate 1 gram TID, vitamin C 1000 mg daily, NS @ 50 ml/hr.  Labs reviewed: CBG 99-122. No HgbA1c in chart.  Nutrition-Focused physical exam completed. Findings are no fat depletion, no muscle depletion, and no edema. Patient with partial upper dentures.  Patient is at risk for malnutrition in setting of poor intake, but does not meet criteria for malnutrition at this time.  Diet Order:  Diet heart healthy/carb modified Room service appropriate? Yes; Fluid consistency: Thin  Skin:  Reviewed, no issues  Last BM:  06/03/2017  Height:   Ht Readings from Last 1 Encounters:  06/03/17  (1.651 m)    Weight:   Wt Readings from Last 1 Encounters:  06/04/17 186 lb 12.8 oz (84.7 kg)    Ideal Body Weight:  56.8 kg  BMI:  Body mass index is 31.09 kg/m.  Estimated Nutritional Needs:   Kcal:  1750-2050 (MSJ x 1.2-1.4)  Protein:  85-100 grams (1-1.2 grams/kg)  Fluid:  1.7-2 L/day (30-35 ml/kg IBW)  EDUCATION NEEDS:   Education needs addressed  Helane Rima, MS, RD, LDN Office: [phone] Pager: [phone] After Hours/Weekend Pager: [phone]

## 2017-06-04 NOTE — Progress Notes (Signed)
[facility] - [place] at [facility]   PATIENT NAME: Amanda [name]    MR#:  [id number]  DATE OF BIRTH:  05/03/1966  SUBJECTIVE: She is admitted for nausea, vomiting and found to have acute bronchitis/early pneumonia. Chest x-ray is negative for pneumonia. On IV Levaquin. Patient has no abdominal pain. Has a constipation usually. History of gallbladder operation on August 1. Patient says that since then she still has nausea and not eating well. Patient told me that she is not taking any pain medicines on a regular basis. She also has recent ankle sprain and has left ankle boot. No further diarrhea.   CHIEF COMPLAINT:  No chief complaint on file.   REVIEW OF SYSTEMS:   ROS CONSTITUTIONAL: No fever, fatigue or weakness.  EYES: No blurred or double vision.  EARS, NOSE, AND THROAT: No tinnitus or ear pain.  RESPIRATORY: No cough, shortness of breath, wheezing or hemoptysis.  CARDIOVASCULAR: No chest pain, orthopnea, edema.  GASTROINTESTINAL: Nausea, poor by mouth intake since gall bladder operation GENITOURINARY: No dysuria, hematuria.  ENDOCRINE: No polyuria, nocturia,  HEMATOLOGY: No anemia, easy bruising or bleeding SKIN: No rash or lesion. MUSCULOSKELETAL: No joint pain or arthritis.   NEUROLOGIC: No tingling, numbness, weakness.  PSYCHIATRY: No anxiety or depression.   DRUG ALLERGIES:   Allergies  Allergen Reactions  . Penicillins Anaphylaxis    Has patient had a PCN reaction causing immediate rash, facial/tongue/throat swelling, SOB or lightheadedness with hypotension: Yes Has patient had a PCN reaction causing severe rash involving mucus membranes or skin necrosis: No Has patient had a PCN reaction that required hospitalization No Has patient had a PCN reaction occurring within the last 10 years: No If all of the above answers are "NO", then may proceed with Cephalosporin use.   . Ace Inhibitors Swelling    VITALS:  Blood pressure 109/62, pulse 63,  temperature 97.9 F (36.6 C), temperature source Oral, resp. rate 20, height  (1.651 m), weight 84.7 kg (186 lb 12.8 oz), SpO2 95 %.  PHYSICAL EXAMINATION:  GENERAL:  51 y.o.-year-old patient lying in the bed with no acute distress.  EYES: Pupils equal, round, reactive to light and accommodation. No scleral icterus. Extraocular muscles intact.  HEENT: Head atraumatic, normocephalic. Oropharynx and nasopharynx clear.  NECK:  Supple, no jugular venous distention. No thyroid enlargement, no tenderness.  LUNGS: Normal breath sounds bilaterally, no wheezing, rales,rhonchi or crepitation. No use of accessory muscles of respiration.  CARDIOVASCULAR: S1, S2 normal. No murmurs, rubs, or gallops.  ABDOMEN: Soft, nontender, nondistended. Bowel sounds present. No organomegaly or mass.  EXTREMITIES: Left ankle range of motion is limited because of recent ankle sprain .  NEUROLOGIC: Cranial nerves II through XII are intact. Muscle strength 5/5 in all extremities. Sensation intact. Gait not checked.  PSYCHIATRIC: The patient is alert and oriented x 3.  SKIN: No obvious rash, lesion, or ulcer.    LABORATORY PANEL:   CBC  Recent Labs Lab 06/03/17 1835  WBC 4.5  HGB 12.7  HCT 37.9  PLT 272   ------------------------------------------------------------------------------------------------------------------  Chemistries   Recent Labs Lab 06/03/17 1835 06/04/17 0552  NA 141 140  K 4.1 3.9  CL 105 106  CO2 30 29  GLUCOSE 107* 103*  BUN 9 12  CREATININE 0.80 0.78  CALCIUM 9.7 8.9  AST 22  --   ALT 24  --   ALKPHOS 88  --   BILITOT 0.3  --    ------------------------------------------------------------------------------------------------------------------  Cardiac Enzymes No  results for input(s): TROPONINI in the last 168 hours. ------------------------------------------------------------------------------------------------------------------  RADIOLOGY:  Dg Chest 1 View  Result  Date: 06/03/2017 CLINICAL DATA:  Follow-up cough. Chest pain and shortness of breath. Pneumonia. EXAM: CHEST 1 VIEW COMPARISON:  10/08/2016; 09/27/2016; chest CT -10/08/2016 FINDINGS: Grossly unchanged enlarged cardiac silhouette and mediastinal contours given persistently reduced lung volumes. There is mild diffuse slightly nodular thickening of the pulmonary interstitium. No discrete focal airspace opacities. No pleural effusion or pneumothorax. No evidence of edema. No acute osseous abnormalities. IMPRESSION: Bronchitic change without superimposed acute cardiopulmonary disease. Specifically, no discrete focal airspace opacities to suggest pneumonia. Further evaluation with a PA and lateral chest radiograph may be obtained as clinically indicated. Electronically Signed   By: Simonne Come M.D.   On: 06/03/2017 19:01    EKG:   Orders placed or performed during the hospital encounter of 10/08/16  . ED EKG within 10 minutes  . ED EKG within 10 minutes  . EKG    ASSESSMENT AND PLAN:  #1 acute bronchitis: Continue Levaquin #2 nausea, vomiting chronic: Since gallbladder operation. Patient's CBC and Chem-7 are within normal range. Continue Zofran, does decrease IV hydration, encourage by mouth intake and use nausea medicines also or discharge..Nausea and vomiting likely due to polypharmacy from her psych medications also. Advised and discussed about these once. Recent gallbladder operation for  symptomatic cholelithiasis;  #3 history of diabetes mellitus type 2: Patient is on Levemir and metformin at home. Not ordered yet. Restart her diet, and continue sliding scale insulin with coverage and then restart the medicines from tomorrow. #4 depression, fibromyalgia: Patient is on Remeron, Cymbalta, Xanax, Abilify #5 history of hypertension: Continue Toprol-XL 50 mg by mouth daily, losartan 25 mg by mouth daily, Imdur 30 MG by mouth daily. #6 recent  left ankle sprain: Follows up with Dr. Ether Griffins, saw him on  August 21. Follow-up with podiatry as an outpatient. #7 recent H. pylori gastritis: Patient followed by surgery on August 1, patient had EGD, colonoscopy, patient is treated  For  H pylori. All the records are reviewed and case discussed with Care Management/Social Workerr. Management plans discussed with the patient, family and they are in agreement.  CODE STATUS: full  TOTAL TIME TAKING CARE OF THIS PATIENT:35 minutes.   POSSIBLE D/C IN 1-2 DAYS, DEPENDING ON CLINICAL CONDITION.   Katha Hamming M.D on 06/04/2017 at 8:34 AM  Between 7am to 6pm - Pager - [phone]  After 6pm go to [email or web address] - password [email or web address] [facility]  [place] [facility]  Office  [phone]  CC: Primary care physician; Margaretann Loveless, MD   Note: This dictation was prepared with Dragon dictation along with smaller phrase technology. Any transcriptional errors that result from this process are unintentional.

## 2017-06-04 NOTE — Progress Notes (Signed)
Inpatient Diabetes Program Recommendations  AACE/ADA: New Consensus Statement on Inpatient Glycemic Control (2015)  Target Ranges:  Prepandial:   less than 140 mg/dL      Peak postprandial:   less than 180 mg/dL (1-2 hours)      Critically ill patients:  140 - 180 mg/dL   Lab Results  Component Value Date   GLUCAP 122 (H) 06/04/2017    Review of Glycemic ControlResults for Amanda [name], Amanda [name] (MRN [id number]) as of 06/04/2017 09:41  Ref. Range 06/03/2017 16:50 06/03/2017 21:50 06/04/2017 07:52  Glucose-Capillary Latest Ref Range: 65 - 99 mg/dL 90 99 409 (H)   Diabetes history: Type 2 diabetes Outpatient Diabetes medications: Invokana 300 mg daily, Levemir 10 units daily Current orders for Inpatient glycemic control:  Novolog sensitive tid with meals   Inpatient Diabetes Program Recommendations:   Note that blood sugars are well controlled with only Novolog correction. Will follow.  Thanks, Beryl Meager, RN, BC-ADM Inpatient Diabetes Coordinator Pager [phone] (8a-5p)

## 2017-06-05 DIAGNOSIS — J189 Pneumonia, unspecified organism: Secondary | ICD-10-CM | POA: Diagnosis not present

## 2017-06-05 DIAGNOSIS — J209 Acute bronchitis, unspecified: Secondary | ICD-10-CM | POA: Diagnosis not present

## 2017-06-05 LAB — GLUCOSE, CAPILLARY
Glucose-Capillary: 91 mg/dL (ref 65–99)
Glucose-Capillary: 207 mg/dL — ABNORMAL HIGH (ref 65–99)
Glucose-Capillary: 136 mg/dL — ABNORMAL HIGH (ref 65–99)
Glucose-Capillary: 142 mg/dL — ABNORMAL HIGH (ref 65–99)

## 2017-06-05 LAB — HIV ANTIBODY (ROUTINE TESTING W REFLEX): HIV Screen 4th Generation wRfx: NONREACTIVE

## 2017-06-05 MED ORDER — METOCLOPRAMIDE HCL 5 MG/ML IJ SOLN
5.0000 mg | Freq: Four times a day (QID) | INTRAMUSCULAR | Status: DC
  Administered 2017-06-05 – 2017-06-06 (×4): 5 mg via INTRAVENOUS
  Filled 2017-06-05 (×4): qty 2

## 2017-06-05 NOTE — Progress Notes (Signed)
[facility] Physicians - [place] at [facility]   PATIENT NAME: Amanda [name]    MR#:  [id number]  DATE OF BIRTH:  1966/02/26  She continues to have nausea, poor by mouth intake. Started on Reglan.   CHIEF COMPLAINT:  No chief complaint on file.   REVIEW OF SYSTEMS:   ROS CONSTITUTIONAL: No fever, fatigue or weakness.  EYES: No blurred or double vision.  EARS, NOSE, AND THROAT: No tinnitus or ear pain.  RESPIRATORY: No cough, shortness of breath, wheezing or hemoptysis.  CARDIOVASCULAR: No chest pain, orthopnea, edema.  GASTROINTESTINAL: Nausea, poor by mouth intake since gall bladder operation GENITOURINARY: No dysuria, hematuria.  ENDOCRINE: No polyuria, nocturia,  HEMATOLOGY: No anemia, easy bruising or bleeding SKIN: No rash or lesion. MUSCULOSKELETAL: No joint pain or arthritis.   NEUROLOGIC: No tingling, numbness, weakness.  PSYCHIATRY: No anxiety or depression.   DRUG ALLERGIES:   Allergies  Allergen Reactions  . Penicillins Anaphylaxis    Has patient had a PCN reaction causing immediate rash, facial/tongue/throat swelling, SOB or lightheadedness with hypotension: Yes Has patient had a PCN reaction causing severe rash involving mucus membranes or skin necrosis: No Has patient had a PCN reaction that required hospitalization No Has patient had a PCN reaction occurring within the last 10 years: No If all of the above answers are "NO", then may proceed with Cephalosporin use.   . Ace Inhibitors Swelling    VITALS:  Blood pressure 133/71, pulse 69, temperature 98.5 F (36.9 C), temperature source Oral, resp. rate 18, height  (1.651 m), weight 84.7 kg (186 lb 12.8 oz), SpO2 99 %.  PHYSICAL EXAMINATION:  GENERAL:  51 y.o.-year-old patient lying in the bed with no acute distress.  EYES: Pupils equal, round, reactive to light and accommodation. No scleral icterus. Extraocular muscles intact.  HEENT: Head atraumatic, normocephalic. Oropharynx and  nasopharynx clear.  NECK:  Supple, no jugular venous distention. No thyroid enlargement, no tenderness.  LUNGS: Normal breath sounds bilaterally, no wheezing, rales,rhonchi or crepitation. No use of accessory muscles of respiration.  CARDIOVASCULAR: S1, S2 normal. No murmurs, rubs, or gallops.  ABDOMEN: Soft, nontender, nondistended. Bowel sounds present. No organomegaly or mass.  EXTREMITIES: Left ankle range of motion is limited because of recent ankle sprain .  NEUROLOGIC: Cranial nerves II through XII are intact. Muscle strength 5/5 in all extremities. Sensation intact. Gait not checked.  PSYCHIATRIC: The patient is alert and oriented x 3.  SKIN: No obvious rash, lesion, or ulcer.    LABORATORY PANEL:   CBC  Recent Labs Lab 06/03/17 1835  WBC 4.5  HGB 12.7  HCT 37.9  PLT 272   ------------------------------------------------------------------------------------------------------------------  Chemistries   Recent Labs Lab 06/03/17 1835 06/04/17 0552  NA 141 140  K 4.1 3.9  CL 105 106  CO2 30 29  GLUCOSE 107* 103*  BUN 9 12  CREATININE 0.80 0.78  CALCIUM 9.7 8.9  AST 22  --   ALT 24  --   ALKPHOS 88  --   BILITOT 0.3  --    ------------------------------------------------------------------------------------------------------------------  Cardiac Enzymes No results for input(s): TROPONINI in the last 168 hours. ------------------------------------------------------------------------------------------------------------------  RADIOLOGY:  Dg Chest 1 View  Result Date: 06/03/2017 CLINICAL DATA:  Follow-up cough. Chest pain and shortness of breath. Pneumonia. EXAM: CHEST 1 VIEW COMPARISON:  10/08/2016; 09/27/2016; chest CT -10/08/2016 FINDINGS: Grossly unchanged enlarged cardiac silhouette and mediastinal contours given persistently reduced lung volumes. There is mild diffuse slightly nodular thickening of the pulmonary interstitium.  No discrete focal airspace  opacities. No pleural effusion or pneumothorax. No evidence of edema. No acute osseous abnormalities. IMPRESSION: Bronchitic change without superimposed acute cardiopulmonary disease. Specifically, no discrete focal airspace opacities to suggest pneumonia. Further evaluation with a PA and lateral chest radiograph may be obtained as clinically indicated. Electronically Signed   By: Simonne Come M.D.   On: 06/03/2017 19:01    EKG:   Orders placed or performed during the hospital encounter of 10/08/16  . ED EKG within 10 minutes  . ED EKG within 10 minutes  . EKG    ASSESSMENT AND PLAN:  #1 acute bronchitis: Continue Levaquin #2 nausea, vomiting chronic:Likely diabetic gastroparesis. Started on Reglan  #3 history of diabetes mellitus type 2: Patient is on Levemir and metformin at home. Not ordered yet. Restart her diet, and continue sliding scale insulin with coverage and then restart the medicines from tomorrow. #4 depression, fibromyalgia: Patient is on Remeron, Cymbalta, Xanax, Abilify #5 history of hypertension: Continue Toprol-XL 50 mg by mouth daily, losartan 25 mg by mouth daily, Imdur 30 MG by mouth daily. #6 .recent  left ankle sprain: Follows up with Dr. Ether Griffins, saw him on August 21. Follow-up with podiatry as an outpatient. #7repeat hpylori H. pylori gastritis: Patient followed by surgery on August 1, patient had EGD, colonoscopy, patient is treated  For  H pylori.rpt All the records are reviewed and case discussed with Care Management/Social Workerr. Management plans discussed with the patient, family and they are in agreement.  CODE STATUS: full  TOTAL TIME TAKING CARE OF THIS PATIENT:35 minutes.   POSSIBLE D/C IN 1-2 DAYS, DEPENDING ON CLINICAL CONDITION.   Katha Hamming M.D on 06/05/2017 at 1:04 PM  Between 7am to 6pm - Pager - [phone]  After 6pm go to [email or web address] - password [email or web address] [facility]  [place] [facility]  Office  [phone]  CC: Primary care  physician; Margaretann Loveless, MD   Note: This dictation was prepared with Dragon dictation along with smaller phrase technology. Any transcriptional errors that result from this process are unintentional.

## 2017-06-05 NOTE — Progress Notes (Signed)
Medications administered by student RN [id number] with supervision of Clinical Instructor Almadelia Looman MSN, RN-BC or patient's assigned RN.   

## 2017-06-06 DIAGNOSIS — J189 Pneumonia, unspecified organism: Secondary | ICD-10-CM | POA: Diagnosis not present

## 2017-06-06 DIAGNOSIS — J209 Acute bronchitis, unspecified: Secondary | ICD-10-CM | POA: Diagnosis not present

## 2017-06-06 DIAGNOSIS — Z23 Encounter for immunization: Secondary | ICD-10-CM | POA: Diagnosis present

## 2017-06-06 LAB — GLUCOSE, CAPILLARY
Glucose-Capillary: 122 mg/dL — ABNORMAL HIGH (ref 65–99)
Glucose-Capillary: 102 mg/dL — ABNORMAL HIGH (ref 65–99)

## 2017-06-06 MED ORDER — ISOSORBIDE MONONITRATE ER 30 MG PO TB24
15.0000 mg | ORAL_TABLET | Freq: Every day | ORAL | 0 refills | Status: DC
Start: 2017-06-06 — End: 2017-10-17

## 2017-06-06 MED ORDER — METOCLOPRAMIDE HCL 5 MG PO TABS: 5.0000 mg | ORAL_TABLET | Freq: Three times a day (TID) | ORAL | 1 refills | Status: DC

## 2017-06-06 NOTE — Progress Notes (Signed)
Patient is stable and ready for discharge. Nausea must better today, tolerating meals. Discharge paperwork reviewed with patient including new medications, medication changes, follow up appointments, and any questions about her hospitalization. Patient verbalized understanding of her discharge instructions. Prescriptions sent electronically. Work note provided. Patient's family to transport home.

## 2017-06-06 NOTE — Progress Notes (Signed)
     Amanda [name] was admitted to the Hospital on 06/03/2017 and Discharged  06/06/2017 and should be excused from work/school   for 4  days starting 06/03/2017 , may return to work/school without any restrictions.    Katha Hamming M.D on 06/06/2017,at 10:29 AM

## 2017-06-06 NOTE — Discharge Summary (Signed)
Amanda [name], is a 51 y.o. female  DOB [birth year]  MRN [id number].  Admission date:  06/03/2017  Admitting Physician  Amanda Saran, MD  Discharge Date:  06/06/2017   Primary MD  Amanda Loveless, MD  Recommendations for primary care physician for things to follow:   Follow-up with PCP in one week Follow up with Amanda [name] in  2 weeks   Admission Diagnosis  nausea vomiting dehydration pneumonia   Discharge Diagnosis  nausea vomiting dehydration pneumonia    Active Problems:   Pneumonia      Past Medical History:  Diagnosis Date  . Arthritis    knees, shoulder, upper back  . Asthma   . Diabetes mellitus without complication (HCC)   . Environmental allergies   . Fibromyalgia   . GERD (gastroesophageal reflux disease)   . Headache    migraines - 5x/mo  . Hypertension   . Migraines   . Motion sickness    ships  . Sleep apnea   . Thyroid nodule    bilateral and goiter  . Wears contact lenses   . Wears dentures    partial upper    Past Surgical History:  Procedure Laterality Date  . ABDOMINAL HYSTERECTOMY  2000's  . ABDOMINAL SURGERY     laparoscopy x4 with lysis of adhesions  . APPENDECTOMY  1986  . CESAREAN SECTION  1992  . CHOLECYSTECTOMY N/A 04/03/2017   Procedure: LAPAROSCOPIC CHOLECYSTECTOMY;  Surgeon: Henrene Dodge, MD;  Location: [facility] ORS;  Service: General;  Laterality: N/A;  . COLONOSCOPY WITH PROPOFOL N/A 03/10/2017   Procedure: COLONOSCOPY WITH PROPOFOL;  Surgeon: Scot Jun, MD;  Location: [facility] ENDOSCOPY;  Service: Endoscopy;  Laterality: N/A;  . ECTOPIC PREGNANCY SURGERY  2000's  . ESOPHAGOGASTRODUODENOSCOPY (EGD) WITH PROPOFOL N/A 03/10/2017   Procedure: ESOPHAGOGASTRODUODENOSCOPY (EGD) WITH PROPOFOL;  Surgeon: Scot Jun, MD;  Location: [facility] ENDOSCOPY;  Service: Endoscopy;   Laterality: N/A;  . HERNIA REPAIR    . JOINT REPLACEMENT Right 12/16/12   knee- medial - makoplasty  . KNEE ARTHROSCOPY Right 2006   partial medial and lateral meniscectomies  . KNEE ARTHROSCOPY Right 10/06/2015   Procedure: RIGHT KNEE ARTHROSCOPY WITH DEBRIDEMENT;  Surgeon: Erin Sons, MD;  Location: [facility];  Service: Orthopedics;  Laterality: Right;  Diabetic - insulin and oral meds  . ROTATOR CUFF REPAIR Left 2006  . TUBAL LIGATION  2000's       History of present illness and  Hospital Course:     Kindly see H&P for history of present illness and admission details, please review complete Labs, Consult reports and Test reports for all details in brief  HPI  from the history and physical done on the day of admission 51 year old female patient with diabetes, A. fib Fibromyalgia, recent cholecystectomy came in because of nausea, vomiting, diarrhea, shortness of breath, cough, productive phlegm.  Hospital Course   Shortness of breath with cough due to bronchitis rather than pneumonia. Received Levaquin for 3 days. She does not need any further doses of antibiotics.  #2 . Nausea, vomiting secondary to diabetic gastroparesis. Patient received IV fluids, IV Zofran, IV PPIs but she still has nausea and vomiting. According to patient ,she has been having this problem for a long time so I started her  on Reglan and the patient told me that Reglan is helping her and she is trying to eat breakfast the first time. I told the patient that she needs gastric motility studies for diabetic  autonomic neuropathy.   Diabetes: Resume her home dose diabetic medication.    4. Essential hypertension: Continue diltiazem and losartan  5. Depression/fibromyalgia: Continue Cymbalta #6 .the recent left ankle sprain patient follows up with Dr. Gwyneth [name]. Has left ankle boot, continue wearing that. follow-up with Dr. Gwyneth [name] in 2 weeks.   Discharge Condition: stable   Follow  UP      Discharge Instructions  and  Discharge Medications      Allergies as of 06/06/2017      Reactions   Penicillins Anaphylaxis   Has patient had a PCN reaction causing immediate rash, facial/tongue/throat swelling, SOB or lightheadedness with hypotension: Yes Has patient had a PCN reaction causing severe rash involving mucus membranes or skin necrosis: No Has patient had a PCN reaction that required hospitalization No Has patient had a PCN reaction occurring within the last 10 years: No If all of the above answers are "NO", then may proceed with Cephalosporin use.   Ace Inhibitors Swelling      Medication List    STOP taking these medications   Diclofenac Sodium 3 % Gel   HYDROcodone-acetaminophen 5-325 MG tablet Commonly known as:  NORCO   ibuprofen 600 MG tablet Commonly known as:  ADVIL,MOTRIN   RESTASIS 0.05 % ophthalmic emulsion Generic drug:  cycloSPORINE     TAKE these medications   albuterol 108 (90 Base) MCG/ACT inhaler Commonly known as:  PROVENTIL HFA Inhale 2 puffs into the lungs every 4 (four) hours as needed for wheezing or shortness of breath.   ALPRAZolam 1 MG tablet Commonly known as:  XANAX Take 1 mg by mouth at bedtime as needed for anxiety or sleep.   ARIPiprazole 10 MG tablet Commonly known as:  ABILIFY Take 10 mg by mouth daily as needed.   BIOTIN PO Take 1 tablet by mouth daily.   dexmethylphenidate 10 MG tablet Commonly known as:  FOCALIN Take 10 mg by mouth 2 (two) times daily.   diltiazem 240 MG 24 hr capsule Commonly known as:  DILACOR XR Take 240 mg by mouth daily.   DULoxetine 60 MG capsule Commonly known as:  CYMBALTA Take 60 mg by mouth daily. AM   flunisolide 25 MCG/ACT (0.025%) Soln Commonly known as:  NASALIDE Place 2 sprays into the nose daily as needed.   fluticasone furoate-vilanterol 200-25 MCG/INH Aepb Commonly known as:  BREO ELLIPTA Inhale 1 puff into the lungs daily.   furosemide 20 MG  tablet Commonly known as:  LASIX Take 40 mg by mouth daily.   glucosamine-chondroitin 500-400 MG tablet Take 1 tablet by mouth daily.   insulin detemir 100 UNIT/ML injection Commonly known as:  LEVEMIR Inject 10 Units into the skin at bedtime.   INVOKANA 300 MG Tabs tablet Generic drug:  canagliflozin Take 300 mg by mouth daily before breakfast.   ipratropium-albuterol 0.5-2.5 (3) MG/3ML Soln Commonly known as:  DUONEB Take 3 mLs by nebulization every 4 (four) hours as needed.   isosorbide mononitrate 30 MG 24 hr tablet Commonly known as:  IMDUR Take 0.5 tablets (15 mg total) by mouth daily. What changed:  how much to take   LINZESS 290 MCG Caps capsule Generic drug:  linaclotide Take 1 tablet by mouth daily.   losartan 25 MG tablet Commonly known as:  COZAAR Take 25 mg by mouth daily.   metFORMIN 500 MG tablet Commonly known as:  GLUCOPHAGE Take 1,000 mg by mouth 2 (two) times daily with a meal.   metoCLOPramide 5  MG tablet Commonly known as:  REGLAN Take 1 tablet (5 mg total) by mouth 3 (three) times daily.   metoprolol succinate 50 MG 24 hr tablet Commonly known as:  TOPROL-XL Take 50 mg by mouth daily. Take with or immediately following a meal.   mirtazapine 30 MG tablet Commonly known as:  REMERON Take 30 mg by mouth at bedtime as needed.   montelukast 10 MG tablet Commonly known as:  SINGULAIR Take 1 tablet (10 mg total) by mouth daily.   multivitamin tablet Take 1 tablet by mouth daily.   omeprazole 40 MG capsule Commonly known as:  PRILOSEC Take 1 capsule (40 mg total) by mouth daily. 1 hr before supper   ondansetron 4 MG disintegrating tablet Commonly known as:  ZOFRAN ODT Take 1 tablet (4 mg total) by mouth every 8 (eight) hours as needed for nausea or vomiting.   oxyCODONE 5 MG immediate release tablet Commonly known as:  Oxy IR/ROXICODONE Take 1-2 tablets (5-10 mg total) by mouth every 6 (six) hours as needed for severe pain.    polyethylene glycol packet Commonly known as:  MIRALAX / GLYCOLAX Take 17 g by mouth daily. Mix one tablespoon with 8oz of your favorite juice or water every day until you are having soft formed stools. Then start taking once daily if you didn't have a stool the day before.   pregabalin 150 MG capsule Commonly known as:  LYRICA Take 150 mg by mouth 3 (three) times daily.   protein supplement shake Liqd Commonly known as:  PREMIER PROTEIN Take 325 mLs (11 oz total) by mouth 3 (three) times daily between meals.   sucralfate 1 g tablet Commonly known as:  CARAFATE Take 1 g by mouth 4 (four) times daily -  with meals and at bedtime.   SUMAtriptan 25 MG tablet Commonly known as:  IMITREX Take 25 mg by mouth every 2 (two) hours as needed for migraine. May repeat in 2 hours if headache persists or recurs.   tiZANidine 4 MG tablet Commonly known as:  ZANAFLEX Take 4 mg by mouth every 6 (six) hours as needed for muscle spasms.   vitamin C 1000 MG tablet Take 1,000 mg by mouth daily.   zolpidem 5 MG tablet Commonly known as:  AMBIEN Take 5 mg by mouth at bedtime.         Diet and Activity recommendation: See Discharge Instructions above   Consults obtained - none   Major procedures and Radiology Reports - PLEASE review detailed and final reports for all details, in brief -      Dg Chest 1 View  Result Date: 06/03/2017 CLINICAL DATA:  Follow-up cough. Chest pain and shortness of breath. Pneumonia. EXAM: CHEST 1 VIEW COMPARISON:  10/08/2016; 09/27/2016; chest CT -10/08/2016 FINDINGS: Grossly unchanged enlarged cardiac silhouette and mediastinal contours given persistently reduced lung volumes. There is mild diffuse slightly nodular thickening of the pulmonary interstitium. No discrete focal airspace opacities. No pleural effusion or pneumothorax. No evidence of edema. No acute osseous abnormalities. IMPRESSION: Bronchitic change without superimposed acute cardiopulmonary  disease. Specifically, no discrete focal airspace opacities to suggest pneumonia. Further evaluation with a PA and lateral chest radiograph may be obtained as clinically indicated. Electronically Signed   By: Simonne Come M.D.   On: 06/03/2017 19:01    Micro Results    No results found for this or any previous visit (from the past 240 hour(s)).     Today   Subjective:   Amanda [name]  today has no headache,no chest abdominal pain,no new weakness tingling or numbness, feels much better wants to go home today.   Objective:   Blood pressure (!) 110/57, pulse 63, temperature 97.9 F (36.6 C), resp. rate 20, height  (1.651 m), weight 84.9 kg (187 lb 3.2 oz), SpO2 97 %.   Intake/Output Summary (Last 24 hours) at 06/06/17 0800 Last data filed at 06/05/17 1916  Gross per 24 hour  Intake              360 ml  Output                0 ml  Net              360 ml    Exam Awake Alert, Oriented x 3, No new F.N deficits, Normal affect [place].AT,PERRAL Supple Neck,No JVD, No cervical lymphadenopathy appriciated.  Symmetrical Chest wall movement, Good air movement bilaterally, CTAB RRR,No Gallops,Rubs or new Murmurs, No Parasternal Heave +ve B.Sounds, Abd Soft, Non tender, No organomegaly appriciated, No rebound -guarding or rigidity. No Cyanosis, Clubbing or edema, No new Rash or bruise  Data Review   CBC w Diff: Lab Results  Component Value Date   WBC 4.5 06/03/2017   HGB 12.7 06/03/2017   HGB 11.4 (L) 11/12/2011   HCT 37.9 06/03/2017   HCT 33.5 (L) 11/12/2011   PLT 272 06/03/2017   PLT 262 11/12/2011   LYMPHOPCT 45 04/02/2017   MONOPCT 10 04/02/2017   EOSPCT 1 04/02/2017   BASOPCT 1 04/02/2017    CMP: Lab Results  Component Value Date   NA 140 06/04/2017   NA 137 11/12/2011   K 3.9 06/04/2017   K 4.9 11/12/2011   CL 106 06/04/2017   CL 101 11/12/2011   CO2 29 06/04/2017   CO2 26 11/12/2011   BUN 12 06/04/2017   BUN 14 11/12/2011   CREATININE 0.78 06/04/2017    CREATININE 0.71 11/12/2011   PROT 7.7 06/03/2017   PROT 6.9 11/12/2011   ALBUMIN 4.1 06/03/2017   ALBUMIN 3.2 (L) 11/12/2011   BILITOT 0.3 06/03/2017   BILITOT 0.2 11/12/2011   ALKPHOS 88 06/03/2017   ALKPHOS 62 11/12/2011   AST 22 06/03/2017   AST 31 11/12/2011   ALT 24 06/03/2017   ALT 28 11/12/2011  .   Total Time in preparing paper work, data evaluation and todays exam - 35 minutes  Bosco Paparella M.D on 06/06/2017 at 8:00 AM    Note: This dictation was prepared with Dragon dictation along with smaller phrase technology. Any transcriptional errors that result from this process are unintentional.

## 2017-06-10 ENCOUNTER — Emergency Department: Payer: Medicare Other

## 2017-06-10 ENCOUNTER — Emergency Department
Admission: EM | Admit: 2017-06-10 | Discharge: 2017-06-10 | Disposition: A | Discharge status: Home / Self Care | Payer: Medicare Other | Attending: Emergency Medicine | Admitting: Emergency Medicine

## 2017-06-10 ENCOUNTER — Other Ambulatory Visit: Payer: Self-pay

## 2017-06-10 DIAGNOSIS — Z96651 Presence of right artificial knee joint: Secondary | ICD-10-CM | POA: Diagnosis not present

## 2017-06-10 DIAGNOSIS — J4520 Mild intermittent asthma, uncomplicated: Secondary | ICD-10-CM | POA: Insufficient documentation

## 2017-06-10 DIAGNOSIS — Z9049 Acquired absence of other specified parts of digestive tract: Secondary | ICD-10-CM | POA: Diagnosis not present

## 2017-06-10 DIAGNOSIS — R05 Cough: Secondary | ICD-10-CM | POA: Insufficient documentation

## 2017-06-10 DIAGNOSIS — Z7984 Long term (current) use of oral hypoglycemic drugs: Secondary | ICD-10-CM | POA: Diagnosis not present

## 2017-06-10 DIAGNOSIS — E119 Type 2 diabetes mellitus without complications: Secondary | ICD-10-CM | POA: Diagnosis not present

## 2017-06-10 DIAGNOSIS — Z79899 Other long term (current) drug therapy: Secondary | ICD-10-CM | POA: Insufficient documentation

## 2017-06-10 DIAGNOSIS — I3139 Other pericardial effusion (noninflammatory): Secondary | ICD-10-CM

## 2017-06-10 DIAGNOSIS — I313 Pericardial effusion (noninflammatory): Secondary | ICD-10-CM | POA: Insufficient documentation

## 2017-06-10 DIAGNOSIS — F329 Major depressive disorder, single episode, unspecified: Secondary | ICD-10-CM | POA: Insufficient documentation

## 2017-06-10 DIAGNOSIS — I10 Essential (primary) hypertension: Secondary | ICD-10-CM | POA: Insufficient documentation

## 2017-06-10 DIAGNOSIS — R059 Cough, unspecified: Secondary | ICD-10-CM

## 2017-06-10 DIAGNOSIS — R0602 Shortness of breath: Secondary | ICD-10-CM | POA: Diagnosis present

## 2017-06-10 LAB — CBC
HCT: 37.8 % (ref 35.0–47.0)
Hemoglobin: 12.8 g/dL (ref 12.0–16.0)
MCH: 30.2 pg (ref 26.0–34.0)
MCHC: 34 g/dL (ref 32.0–36.0)
MCV: 88.9 fL (ref 80.0–100.0)
Platelets: 268 10*3/uL (ref 150–440)
RBC: 4.25 MIL/uL (ref 3.80–5.20)
RDW: 14.8 % — ABNORMAL HIGH (ref 11.5–14.5)
WBC: 4.6 10*3/uL (ref 3.6–11.0)

## 2017-06-10 LAB — COMPREHENSIVE METABOLIC PANEL
ALT: 35 U/L (ref 14–54)
AST: 30 U/L (ref 15–41)
Albumin: 4.5 g/dL (ref 3.5–5.0)
Alkaline Phosphatase: 92 U/L (ref 38–126)
Anion gap: 10 (ref 5–15)
BUN: 10 mg/dL (ref 6–20)
CO2: 27 mmol/L (ref 22–32)
Calcium: 10.1 mg/dL (ref 8.9–10.3)
Chloride: 103 mmol/L (ref 101–111)
Creatinine, Ser: 0.98 mg/dL (ref 0.44–1.00)
GFR calc Af Amer: >60 mL/min (ref >60)
GFR calc non Af Amer: >60 mL/min (ref >60)
Glucose, Bld: 97 mg/dL (ref 65–99)
Potassium: 3.9 mmol/L (ref 3.5–5.1)
Sodium: 140 mmol/L (ref 135–145)
Total Bilirubin: 0.5 mg/dL (ref 0.3–1.2)
Total Protein: 7.9 g/dL (ref 6.5–8.1)

## 2017-06-10 LAB — FIBRIN DERIVATIVES D-DIMER (ARMC ONLY): Fibrin derivatives D-dimer (ARMC): 811.39 ng/mL (FEU) — ABNORMAL HIGH (ref 0.00–499.00)

## 2017-06-10 LAB — TROPONIN I: Troponin I: <0.03 ng/mL (ref <0.03)

## 2017-06-10 LAB — BRAIN NATRIURETIC PEPTIDE: B Natriuretic Peptide: 24 pg/mL (ref 0.0–100.0)

## 2017-06-10 MED ORDER — IPRATROPIUM-ALBUTEROL 0.5-2.5 (3) MG/3ML IN SOLN
3.0000 mL | Freq: Once | RESPIRATORY_TRACT | Status: AC
  Administered 2017-06-10: 3 mL via RESPIRATORY_TRACT

## 2017-06-10 MED ORDER — IPRATROPIUM-ALBUTEROL 0.5-2.5 (3) MG/3ML IN SOLN
RESPIRATORY_TRACT | Status: AC
  Administered 2017-06-10: 3 mL via RESPIRATORY_TRACT
  Filled 2017-06-10: qty 3

## 2017-06-10 MED ORDER — PREDNISONE 20 MG PO TABS: 60.0000 mg | ORAL_TABLET | Freq: Every day | ORAL | 0 refills | Status: DC

## 2017-06-10 MED ORDER — IOPAMIDOL (ISOVUE-370) INJECTION 76%
75.0000 mL | Freq: Once | INTRAVENOUS | Status: AC | PRN
  Administered 2017-06-10: 75 mL via INTRAVENOUS

## 2017-06-10 MED ORDER — SODIUM CHLORIDE 0.9 % IV BOLUS (SEPSIS)
1000.0000 mL | Freq: Once | INTRAVENOUS | Status: AC
  Administered 2017-06-10: 1000 mL via INTRAVENOUS

## 2017-06-10 MED ORDER — PREDNISONE 20 MG PO TABS
60.0000 mg | ORAL_TABLET | Freq: Once | ORAL | Status: AC
  Administered 2017-06-10: 60 mg via ORAL
  Filled 2017-06-10: qty 3

## 2017-06-10 MED ORDER — ALBUTEROL SULFATE (2.5 MG/3ML) 0.083% IN NEBU: 5.0000 mg | INHALATION_SOLUTION | Freq: Once | RESPIRATORY_TRACT | Status: DC

## 2017-06-10 NOTE — ED Provider Notes (Signed)
signout from Dr. Marianna Fuss in this 51 year old female presented with shortness of breath and chest pain. Also with recent admission for bronchitis. Plan is follow up with d-dimer and if it is positives do a CTA of the chest.  Physical Exam  BP (!) 145/90   Pulse 87   Temp 99 F (37.2 C) (Oral)   Resp 16   Ht  (1.651 m)   Wt 85.7 kg (189 lb)   SpO2 100%   BMI 31.45 kg/m  ----------------------------------------- 5:45 PM on 06/10/2017 -----------------------------------------   Physical Exam Patient without any respiratory distress. Speaks in full sentences. Lungs are clear throughout without tachypnea or labored respirations. ED Course  Procedures  MDM CAT scan without PE nor is there evidence of pneumonia. Stable pericardial effusion. I discussed the lab as well as imaging results with the patient as well as family and they're understanding willing to comply per patient will be discharged home.       Myrna Blazer, MD 06/10/17 1745

## 2017-06-10 NOTE — ED Provider Notes (Addendum)
[facility] Emergency Department Provider Note  ____________________________________________   I have reviewed the triage vital signs and the nursing notes.   HISTORY  Chief Complaint Shortness of Breath; Cough; and Pneumonia    HPI Amanda [name] is a 51 y.o. female who presents today complaining of shortness of breath and cough and wheeze. Patient does have asthma. She also unfortunately suffers from fibromyalgia and anxiety. She has had a recent hospitalization for bronchitis, no further antibiotics are indicated. She had an extensive workup while she was here at that time. She does not have a history of ACS although she does suffer occasionally she states from "sinus tachycardia". Patient states that she has been having a cough like her pneumonia for the last 3 Days Uninterruptedly. Sometimes it hurts to cough. She denies any pleuritic chest pain or leg swelling no personal or family history of PE or DVT, she does, however, have a boot on her left ankle which is soft and noncompressive, for an ankle injury suffered sometime over the summer. patient is tearful and upset. She is worried that her "pneumonia" is coming back although it was actually diagnosed as borchitis, she states it feels exactly the same, positive URI symptoms with mild nasal discharge etc. all of these symptoms are uninterrupted for 3 days. Has not been able to see her primary care doctor.   Past Medical History:  Diagnosis Date  . Arthritis    knees, shoulder, upper back  . Asthma   . Diabetes mellitus without complication (HCC)   . Environmental allergies   . Fibromyalgia   . GERD (gastroesophageal reflux disease)   . Headache    migraines - 5x/mo  . Hypertension   . Migraines   . Motion sickness    ships  . Sleep apnea   . Thyroid nodule    bilateral and goiter  . Wears contact lenses   . Wears dentures    partial upper    Patient Active Problem List   Diagnosis Date Noted   . Pneumonia 06/03/2017  . Symptomatic cholelithiasis 04/02/2017  . Recurrent incisional hernia 03/17/2017  . Calculus of gallbladder without cholecystitis without obstruction 03/17/2017  . Helicobacter pylori gastritis 03/17/2017  . Community acquired pneumonia 09/28/2016  . Depression 09/28/2016  . Diabetes mellitus, type 2 (HCC) 09/28/2016  . Nausea and vomiting 09/28/2016  . Acute respiratory failure with hypoxemia (HCC) 03/18/2016  . Acute right ankle pain 10/04/2014  . Esophageal reflux 06/22/2012    Past Surgical History:  Procedure Laterality Date  . ABDOMINAL HYSTERECTOMY  2000's  . ABDOMINAL SURGERY     laparoscopy x4 with lysis of adhesions  . APPENDECTOMY  1986  . CESAREAN SECTION  1992  . CHOLECYSTECTOMY N/A 04/03/2017   Procedure: LAPAROSCOPIC CHOLECYSTECTOMY;  Surgeon: Henrene Dodge, MD;  Location: [facility] ORS;  Service: General;  Laterality: N/A;  . COLONOSCOPY WITH PROPOFOL N/A 03/10/2017   Procedure: COLONOSCOPY WITH PROPOFOL;  Surgeon: Scot Jun, MD;  Location: [facility] ENDOSCOPY;  Service: Endoscopy;  Laterality: N/A;  . ECTOPIC PREGNANCY SURGERY  2000's  . ESOPHAGOGASTRODUODENOSCOPY (EGD) WITH PROPOFOL N/A 03/10/2017   Procedure: ESOPHAGOGASTRODUODENOSCOPY (EGD) WITH PROPOFOL;  Surgeon: Scot Jun, MD;  Location: [facility] ENDOSCOPY;  Service: Endoscopy;  Laterality: N/A;  . HERNIA REPAIR    . JOINT REPLACEMENT Right 12/16/12   knee- medial - makoplasty  . KNEE ARTHROSCOPY Right 2006   partial medial and lateral meniscectomies  . KNEE ARTHROSCOPY Right 10/06/2015   Procedure: RIGHT KNEE ARTHROSCOPY  WITH DEBRIDEMENT;  Surgeon: Erin Sons, MD;  Location: [facility];  Service: Orthopedics;  Laterality: Right;  Diabetic - insulin and oral meds  . ROTATOR CUFF REPAIR Left 2006  . TUBAL LIGATION  2000's    Prior to Admission medications   Medication Sig Start Date End Date Taking? Authorizing Provider  albuterol (PROVENTIL HFA) 108 (90 Base) MCG/ACT  inhaler Inhale 2 puffs into the lungs every 4 (four) hours as needed for wheezing or shortness of breath. 04/16/17   Shane Crutch, MD  ALPRAZolam Prudy Feeler) 1 MG tablet Take 1 mg by mouth at bedtime as needed for anxiety or sleep.    [provider]  ARIPiprazole (ABILIFY) 10 MG tablet Take 10 mg by mouth daily as needed.    [provider]  Ascorbic Acid (VITAMIN C) 1000 MG tablet Take 1,000 mg by mouth daily.    [provider]  BIOTIN PO Take 1 tablet by mouth daily.     [provider]  canagliflozin (INVOKANA) 300 MG TABS tablet Take 300 mg by mouth daily before breakfast.    [provider]  dexmethylphenidate (FOCALIN) 10 MG tablet Take 10 mg by mouth 2 (two) times daily. 08/16/16   [provider]  diltiazem (DILACOR XR) 240 MG 24 hr capsule Take 240 mg by mouth daily.    [provider]  DULoxetine (CYMBALTA) 60 MG capsule Take 60 mg by mouth daily. AM    [provider]  flunisolide (NASALIDE) 25 MCG/ACT (0.025%) SOLN Place 2 sprays into the nose daily as needed.    [provider]  fluticasone furoate-vilanterol (BREO ELLIPTA) 200-25 MCG/INH AEPB Inhale 1 puff into the lungs daily. 04/16/17   Shane Crutch, MD  furosemide (LASIX) 20 MG tablet Take 40 mg by mouth daily.     [provider]  glucosamine-chondroitin 500-400 MG tablet Take 1 tablet by mouth daily.     [provider]  insulin detemir (LEVEMIR) 100 UNIT/ML injection Inject 10 Units into the skin at bedtime.    [provider]  ipratropium-albuterol (DUONEB) 0.5-2.5 (3) MG/3ML SOLN Take 3 mLs by nebulization every 4 (four) hours as needed.    [provider]  isosorbide mononitrate (IMDUR) 30 MG 24 hr tablet Take 0.5 tablets (15 mg total) by mouth daily. 06/06/17   Katha Hamming, MD  LINZESS 290 MCG CAPS capsule Take 1 tablet by mouth daily. 02/28/17   [provider]  losartan (COZAAR)  25 MG tablet Take 25 mg by mouth daily.    [provider]  metFORMIN (GLUCOPHAGE) 500 MG tablet Take 1,000 mg by mouth 2 (two) times daily with a meal.     [provider]  metoCLOPramide (REGLAN) 5 MG tablet Take 1 tablet (5 mg total) by mouth 3 (three) times daily. 06/06/17 06/06/18  Katha Hamming, MD  metoprolol succinate (TOPROL-XL) 50 MG 24 hr tablet Take 50 mg by mouth daily. Take with or immediately following a meal.    [provider]  mirtazapine (REMERON) 30 MG tablet Take 30 mg by mouth at bedtime as needed.     [provider]  montelukast (SINGULAIR) 10 MG tablet Take 1 tablet (10 mg total) by mouth daily. 04/16/17 04/16/18  Shane Crutch, MD  Multiple Vitamin (MULTIVITAMIN) tablet Take 1 tablet by mouth daily.    [provider]  omeprazole (PRILOSEC) 40 MG capsule Take 1 capsule (40 mg total) by mouth daily. 1 hr before supper 04/05/17   Piscoya, [place],  MD  ondansetron (ZOFRAN ODT) 4 MG disintegrating tablet Take 1 tablet (4 mg total) by mouth every 8 (eight) hours as needed for nausea or vomiting. 04/05/17   Henrene Dodge, MD  oxyCODONE (OXY IR/ROXICODONE) 5 MG immediate release tablet Take 1-2 tablets (5-10 mg total) by mouth every 6 (six) hours as needed for severe pain. 04/05/17   Piscoya, Elita Quick, MD  polyethylene glycol (MIRALAX / GLYCOLAX) packet Take 17 g by mouth daily. Mix one tablespoon with 8oz of your favorite juice or water every day until you are having soft formed stools. Then start taking once daily if you didn't have a stool the day before. 04/17/17   Willy Eddy, MD  pregabalin (LYRICA) 150 MG capsule Take 150 mg by mouth 3 (three) times daily.     [provider]  protein supplement shake (PREMIER PROTEIN) LIQD Take 325 mLs (11 oz total) by mouth 3 (three) times daily between meals. 04/05/17   Henrene Dodge, MD  sucralfate (CARAFATE) 1 g tablet Take 1 g by mouth 4 (four) times daily -  with meals and at  bedtime.    [provider]  SUMAtriptan (IMITREX) 25 MG tablet Take 25 mg by mouth every 2 (two) hours as needed for migraine. May repeat in 2 hours if headache persists or recurs.    [provider]  tiZANidine (ZANAFLEX) 4 MG tablet Take 4 mg by mouth every 6 (six) hours as needed for muscle spasms.    [provider]  zolpidem (AMBIEN) 5 MG tablet Take 5 mg by mouth at bedtime.     [provider]    Allergies Penicillins and Ace inhibitors  Family History  Problem Relation Age of Onset  . Hypertension Mother   . Hypertension Father   . Diabetes Father   . Allergies Father     Social History Social History  Substance Use Topics  . Smoking status: Never Smoker  . Smokeless tobacco: Never Used  . Alcohol use No    Review of Systems Constitutional: subjectivemild low-grade fever Eyes: No visual changes. ENT: mildsore throat. No stiff neck no neck painpositive rhinorrhea Cardiovascular: Denies chest pain unless coughing Respiratory: positive  symptoms of cough and wheeze Gastrointestinal:   no vomiting.  No diarrhea.  No constipation. Genitourinary: Negative for dysuria. Musculoskeletal: Negative lower extremity swelling Skin: Negative for rash. Neurological: Negative for severe headaches, focal weakness or numbness.   ____________________________________________   PHYSICAL EXAM:  VITAL SIGNS: ED Triage Vitals  Enc Vitals Group     BP 06/10/17 1041 (!) 142/80     Pulse Rate 06/10/17 1041 66     Resp 06/10/17 1041 14     Temp 06/10/17 1041 99 F (37.2 C)     Temp Source 06/10/17 1041 Oral     SpO2 06/10/17 1041 96 %     Weight 06/10/17 1042 189 lb (85.7 kg)     Height 06/10/17 1042  (1.651 m)     Head Circumference --      Peak Flow --      Pain Score --      Pain Loc --      Pain Edu? --      Excl. in GC? --     Constitutional: Alert and oriented. Well appearing and in no acute distress.she is using her cell phone  in no acute distress but when I do talked her she becomes very anxious and somewhat tearful Eyes: Conjunctivae are normal Head: Atraumatic HEENT:  slightcongestion/rhinnorhea. Mucous membranes are moist.  Oropharynx non-erythematous Neck:   Nontender with no meningismus, no masses, no stridor Cardiovascular: Normal rate, regular rhythm. Grossly normal heart sounds.  Good peripheral circulation. Respiratory: Normal respiratory effort.  No retractions. Lungs occasional coarse breath sounds no rales or rhonchi Abdominal: Soft and nontender. No distention. No guarding no rebound Back:  There is no focal tenderness or step off.  there is no midline tenderness there are no lesions noted. there is no CVA tenderness Musculoskeletal: No lower extremity tenderness, no upper extremity tenderness. No joint effusions, no DVT signs strong distal pulses no edema Neurologic:  Normal speech and language. No gross focal neurologic deficits are appreciated.  Skin:  Skin is warm, dry and intact. No rash noted. Psychiatric: Mood and affect are anxious. Speech and behavior are normal.  ____________________________________________   LABS (all labs ordered are listed, but only abnormal results are displayed)  Labs Reviewed  CBC - Abnormal; Notable for the following:       Result Value   RDW 14.8 (*)    All other components within normal limits  COMPREHENSIVE METABOLIC PANEL  TROPONIN I  BRAIN NATRIURETIC PEPTIDE  FIBRIN DERIVATIVES D-DIMER ([facility] ONLY)    Pertinent labs  results that were available during my care of the patient were reviewed by me and considered in my medical decision making (see chart for details). ____________________________________________  EKG  I personally interpreted any EKGs ordered by me or triage sinus rhythm rate 58 bpm, borderline bradycardia no acute ST elevation or depression, ____________________________________________  RADIOLOGY  Pertinent labs & imaging results that  were available during my care of the patient were reviewed by me and considered in my medical decision making (see chart for details). If possible, patient and/or family made aware of any abnormal findings. ____________________________________________    PROCEDURES  Procedure(s) performed: None  Procedures  Critical Care performed: None  ____________________________________________   INITIAL IMPRESSION / ASSESSMENT AND PLAN / ED COURSE  Pertinent labs & imaging results that were available during my care of the patient were reviewed by me and considered in my medical decision making (see chart for details).  patient here with cough, and wheeze , lungs actually sound very good sats are 100% she has slightly coarse breath sounds consistent with her cough.she has URI symptoms this is most likely viral and there is certainly some component of anxiety. It. Nonetheless, I did do a cardiac enzyme which is negative despite 3 days of an interrupted symptoms, we also performed a BNP which is negative, chest x-ray is reassuring,low suspicion for ACS and CHF in any event given history however we did check. I don't think serial enzymes are indicated based on patient's uninterrupted symptoms for 3 days however I did send a d-dimer on the patient as she does have her foot number. I have able to take off the boot there is no evidence of DVT.  ----------------------------------------- 3:20 PM on 06/10/2017 -----------------------------------------  she feels much better after albuterol lungs remained clear, she is calm her at this time, she feels that she can go home at this time we will send d-dimer if that is negative is my hope we can get her safely there. We will give her prednisone for her cough and wheeze however I don't think antibiotics are emergently indicated  ----------------------------------------- 3:31 PM on 06/10/2017 -----------------------------------------  Signed out to dr. Langston Masker.  If d dimer is negative pt likely will be good for discharge.    ____________________________________________  FINAL CLINICAL IMPRESSION(S) / ED DIAGNOSES  Final diagnoses:  None      This chart was dictated using voice recognition software.  Despite best efforts to proofread,  errors can occur which can change meaning.      Jeanmarie Plant, MD 06/10/17 1439    Jeanmarie Plant, MD 06/10/17 1521    Jeanmarie Plant, MD 06/10/17 [phone]

## 2017-06-10 NOTE — ED Triage Notes (Signed)
Pt reports was discharged from the hospital Friday. Pt reports that she was admitted for pneumonia. Pt reports not feeling well again, reports feels SOB, is coughing, feels weak and is worried her illness if back.

## 2017-06-16 ENCOUNTER — Encounter: Payer: Self-pay | Admitting: Internal Medicine

## 2017-06-16 ENCOUNTER — Ambulatory Visit (INDEPENDENT_AMBULATORY_CARE_PROVIDER_SITE_OTHER): Payer: Medicare Other | Admitting: Internal Medicine

## 2017-06-16 VITALS — BP 136/90 | HR 64 | Resp 16 | Ht 65.0 in | Wt 188.0 lb

## 2017-06-16 DIAGNOSIS — R0600 Dyspnea, unspecified: Secondary | ICD-10-CM

## 2017-06-16 MED ORDER — TIOTROPIUM BROMIDE MONOHYDRATE 18 MCG IN CAPS: 18.0000 ug | ORAL_CAPSULE | Freq: Every day | RESPIRATORY_TRACT | 12 refills | Status: DC

## 2017-06-16 NOTE — Progress Notes (Signed)
[facility] Consultation      Assessment and Plan:  51 yo female with persistent asthma and frequent exacerbations.   Asthma  --Continued asthma, continue with singulair, nebs, Breo. Recent ER visit with exacerbation of asthma in October 2018. --Normal CBC will await results of RAST.  --continue Breo, Singulair. We'll add Spiriva. -Patient is already received flu vaccine and pneumonia vaccine.  Dyspnea.  --Improved, she has some atelectasis on CT scan, and restriction on PFT suggestive of obesity, recommended that she continue exercising    Bronchiectasis. --repeat chest x-ray  OSA -Recently diagnosed with sleep apnea, she is doing well with cpap,  to continue.   GERD. -Continue omeprazole daily.  Date: 06/16/2017  MRN# [id number] Amanda [name] [birth year]  Referring Physician: Pasty Spillers McLean-Scocozza, MD   [name] is a 51 y.o. old female seen in consultation for chief complaint of:    Chief Complaint  Patient presents with  . Asthma    HPI:   The patient is a 51 year old female with a history of asthma, hypertension, diabetes, GERD, fibromyalgia. She was admitted to the hospital in July of 2017 with pneumonia and AE Asthma.  At last visit she was having persistent symptoms on Breo once daily, albuterol nebs; we added singulair and sent for RAST and CBC.   She is using breo every morning, proair every day before she exercises. She is able to exercise but notes that she does ok as long as she is not sick. She takes singulair  At night, she uses nebulizer daily because she was instructed to use it every 4 hours. She remains on prednisone since she was put on it for her recent exacerbation for a few more days.   She has no pets at home, she does not take an anhistamine.    She has gerd, and she takes omeprazole every night.   She has no pets. She has been diagnosed with OSA, she is now on cpap, she is more awake during the day.    **8/1/18CBC with differential. Eosinophil equals 0  Beginning O2 sat on RA at rest. After walking 600 feet her sat was normal and HR 115. Moderate dyspnea but recovered quickly.   CT chest images from 05/21/16 reviewed; mild basilar atelectasis and minimal subpleural interstitial changes.  Chest x-ray 03/20/16 and CT chest images from 01/04/16: Bibasilar subpleural fine interstitial changes, some mild bibasilar bronchiectasis changes. Cardiomegaly> Finding c/w CHF vs early pulmonary fibrosis.   Summary of outside records: Patient has a history of diabetes mellitus, hypertension, sleep apnea, asthma, multiple episodes of pneumonia, review of CMP from 03/26/16, normal. She has been having trouble with wheezing and has been treated for asthma exacerbation and pneumonia. She had a CT at [facility] and was told it was consistent with heart failure.  CBC 04/11/16: Eos 0.1 RAST: Pending.  PFT 05/23/16: Spirometry shows restriction without reversal with bronchodilatory therapy.   Medication:   Reviewed.     Allergies:  Penicillins and Ace inhibitors  Review of Systems: Gen:  Denies  fever, sweats, chills HEENT: Denies blurred vision,  Cvc:  No dizziness, chest pain. Resp:   Denies cough or sputum production, Gi: Denies swallowing difficulty, stomach pain. Other:  All other systems were reviewed with the patient and were negative other that what is mentioned in the HPI.   Physical Examination:   VS: BP 136/90 (BP Location: Left Arm, Cuff Size: Normal)   Pulse 64   Resp 16   Ht   (1.651 m)   Wt 188 lb (85.3 kg)   SpO2 96%   BMI 31.28 kg/m   General Appearance: No distress  Neuro:without focal findings,  speech normal,  HEENT: PERRLA, EOM intact.   Pulmonary: normal breath sounds, No wheezing.  CardiovascularNormal S1,S2.  No m/r/g.   Abdomen: Benign, Soft, non-tender. Renal:  No costovertebral tenderness  GU:  No performed at this time. Endoc: No evident thyromegaly, no signs of  acromegaly. Skin:   warm, no rashes, no ecchymosis  Extremities: normal, no cyanosis, clubbing.  Other findings:    LABORATORY PANEL:   CBC  Recent Labs Lab 06/10/17 1059  WBC 4.6  HGB 12.8  HCT 37.8  PLT 268   ------------------------------------------------------------------------------------------------------------------  Chemistries   Recent Labs Lab 06/10/17 1059  NA 140  K 3.9  CL 103  CO2 27  GLUCOSE 97  BUN 10  CREATININE 0.98  CALCIUM 10.1  AST 30  ALT 35  ALKPHOS 92  BILITOT 0.5   ------------------------------------------------------------------------------------------------------------------  Cardiac Enzymes  Recent Labs Lab 06/10/17 1059  TROPONINI <0.03   ------------------------------------------------------------  RADIOLOGY:  No results found.     Thank  you for the consultation and for allowing [facility] to assist in the care of your patient. Our recommendations are noted above.  Please contact us if we can be of further service.   Wells Guiles, MD.  Board Certified in Internal Medicine, Pulmonary Medicine, Critical Care Medicine, and Sleep Medicine.  [facility] Office Number: [phone]  Santiago Glad, M.D.  Billy Fischer, M.D  06/16/2017

## 2017-06-16 NOTE — Patient Instructions (Signed)
Will start spiriva inhaler once daily.

## 2017-06-25 ENCOUNTER — Other Ambulatory Visit
Admission: RE | Admit: 2017-06-25 | Discharge: 2017-06-25 | Disposition: A | Discharge status: Home / Self Care | Payer: Medicare Other | Source: Other Acute Inpatient Hospital | Attending: Student | Admitting: Student

## 2017-06-25 DIAGNOSIS — K2970 Gastritis, unspecified, without bleeding: Secondary | ICD-10-CM | POA: Diagnosis present

## 2017-06-25 DIAGNOSIS — B9681 Helicobacter pylori [H. pylori] as the cause of diseases classified elsewhere: Secondary | ICD-10-CM | POA: Diagnosis present

## 2017-06-26 LAB — H. PYLORI ANTIGEN, STOOL: H. Pylori Stool Ag, Eia: NEGATIVE

## 2017-06-28 ENCOUNTER — Other Ambulatory Visit: Payer: Self-pay | Admitting: Internal Medicine

## 2017-08-05 ENCOUNTER — Other Ambulatory Visit: Payer: Self-pay | Admitting: Internal Medicine

## 2017-08-05 DIAGNOSIS — E049 Nontoxic goiter, unspecified: Secondary | ICD-10-CM

## 2017-08-08 ENCOUNTER — Ambulatory Visit: Payer: Medicare Other

## 2017-09-09 ENCOUNTER — Other Ambulatory Visit: Payer: Self-pay | Admitting: Internal Medicine

## 2017-09-09 DIAGNOSIS — E049 Nontoxic goiter, unspecified: Secondary | ICD-10-CM

## 2017-09-12 ENCOUNTER — Ambulatory Visit: Payer: Medicaid Other

## 2017-09-26 ENCOUNTER — Other Ambulatory Visit: Payer: Self-pay | Admitting: Internal Medicine

## 2017-10-07 ENCOUNTER — Emergency Department
Admission: EM | Admit: 2017-10-07 | Discharge: 2017-10-07 | Disposition: A | Discharge status: Home / Self Care | Payer: 59 | Attending: Emergency Medicine | Admitting: Emergency Medicine

## 2017-10-07 ENCOUNTER — Encounter: Payer: Self-pay | Admitting: Emergency Medicine

## 2017-10-07 DIAGNOSIS — Z794 Long term (current) use of insulin: Secondary | ICD-10-CM | POA: Diagnosis not present

## 2017-10-07 DIAGNOSIS — K529 Noninfective gastroenteritis and colitis, unspecified: Secondary | ICD-10-CM | POA: Insufficient documentation

## 2017-10-07 DIAGNOSIS — R112 Nausea with vomiting, unspecified: Secondary | ICD-10-CM | POA: Diagnosis present

## 2017-10-07 DIAGNOSIS — E119 Type 2 diabetes mellitus without complications: Secondary | ICD-10-CM | POA: Diagnosis not present

## 2017-10-07 DIAGNOSIS — I10 Essential (primary) hypertension: Secondary | ICD-10-CM | POA: Diagnosis not present

## 2017-10-07 DIAGNOSIS — Z96651 Presence of right artificial knee joint: Secondary | ICD-10-CM | POA: Diagnosis not present

## 2017-10-07 DIAGNOSIS — E860 Dehydration: Secondary | ICD-10-CM

## 2017-10-07 DIAGNOSIS — Z79899 Other long term (current) drug therapy: Secondary | ICD-10-CM | POA: Diagnosis not present

## 2017-10-07 DIAGNOSIS — R197 Diarrhea, unspecified: Secondary | ICD-10-CM

## 2017-10-07 DIAGNOSIS — J45909 Unspecified asthma, uncomplicated: Secondary | ICD-10-CM | POA: Diagnosis not present

## 2017-10-07 LAB — CBC
HCT: 45.5 % (ref 35.0–47.0)
Hemoglobin: 14.8 g/dL (ref 12.0–16.0)
MCH: 29.5 pg (ref 26.0–34.0)
MCHC: 32.5 g/dL (ref 32.0–36.0)
MCV: 90.8 fL (ref 80.0–100.0)
Platelets: 275 10*3/uL (ref 150–440)
RBC: 5.01 MIL/uL (ref 3.80–5.20)
RDW: 13.6 % (ref 11.5–14.5)
WBC: 6.9 10*3/uL (ref 3.6–11.0)

## 2017-10-07 LAB — URINALYSIS, COMPLETE (UACMP) WITH MICROSCOPIC
Bacteria, UA: NONE SEEN
Bilirubin Urine: NEGATIVE
Glucose, UA: 50 mg/dL — AB
Ketones, ur: 5 mg/dL — AB
Leukocytes, UA: NEGATIVE
Nitrite: NEGATIVE
Protein, ur: 100 mg/dL — AB
Specific Gravity, Urine: 1.009 (ref 1.005–1.030)
pH: 6 (ref 5.0–8.0)

## 2017-10-07 LAB — BASIC METABOLIC PANEL
Anion gap: 13 (ref 5–15)
Anion gap: 11 (ref 5–15)
BUN: 14 mg/dL (ref 6–20)
BUN: 15 mg/dL (ref 6–20)
CO2: 23 mmol/L (ref 22–32)
CO2: 18 mmol/L — ABNORMAL LOW (ref 22–32)
Calcium: 9.9 mg/dL (ref 8.9–10.3)
Calcium: 8 mg/dL — ABNORMAL LOW (ref 8.9–10.3)
Chloride: 104 mmol/L (ref 101–111)
Chloride: 112 mmol/L — ABNORMAL HIGH (ref 101–111)
Creatinine, Ser: 1.57 mg/dL — ABNORMAL HIGH (ref 0.44–1.00)
Creatinine, Ser: 1.38 mg/dL — ABNORMAL HIGH (ref 0.44–1.00)
GFR calc Af Amer: 43 mL/min — ABNORMAL LOW (ref >60)
GFR calc Af Amer: 50 mL/min — ABNORMAL LOW (ref >60)
GFR calc non Af Amer: 37 mL/min — ABNORMAL LOW (ref >60)
GFR calc non Af Amer: 43 mL/min — ABNORMAL LOW (ref >60)
Glucose, Bld: 116 mg/dL — ABNORMAL HIGH (ref 65–99)
Glucose, Bld: 86 mg/dL (ref 65–99)
Potassium: 3.8 mmol/L (ref 3.5–5.1)
Potassium: 3.4 mmol/L — ABNORMAL LOW (ref 3.5–5.1)
Sodium: 140 mmol/L (ref 135–145)
Sodium: 141 mmol/L (ref 135–145)

## 2017-10-07 LAB — INFLUENZA PANEL BY PCR (TYPE A & B)
Influenza A By PCR: NEGATIVE
Influenza B By PCR: NEGATIVE

## 2017-10-07 LAB — GLUCOSE, CAPILLARY: Glucose-Capillary: 113 mg/dL — ABNORMAL HIGH (ref 65–99)

## 2017-10-07 MED ORDER — ONDANSETRON HCL 4 MG/2ML IJ SOLN
4.0000 mg | Freq: Once | INTRAMUSCULAR | Status: AC
  Administered 2017-10-07: 4 mg via INTRAVENOUS
  Filled 2017-10-07: qty 2

## 2017-10-07 MED ORDER — ONDANSETRON HCL 4 MG PO TABS: 4.0000 mg | ORAL_TABLET | Freq: Three times a day (TID) | ORAL | 0 refills | Status: DC | PRN

## 2017-10-07 MED ORDER — SODIUM CHLORIDE 0.9 % IV BOLUS (SEPSIS)
1000.0000 mL | Freq: Once | INTRAVENOUS | Status: AC
  Administered 2017-10-07: 1000 mL via INTRAVENOUS

## 2017-10-07 NOTE — ED Provider Notes (Signed)
[facility] Emergency Department Provider Note  ____________________________________________  Time seen: Approximately 3:53 PM  I have reviewed the triage vital signs and the nursing notes.   HISTORY  Chief Complaint Weakness   HPI Amanda [name] is a 52 y.o. female with a history of fibromyalgia, hypertension, migraines, diabetes who presents for evaluation of generalized weakness. Patient reports 2 days of nausea, vomiting, diarrhea. She reports more than 10 episodes of nonbloody nonbilious emesis starting yesterday and more than 10 episodes of watery diarrhea. She has had chills. She is also complaining of intermittent crampy abdominal pain that is moderate in intensity. No pain at this time. She has had no fevers. No chest pain or shortness of breath, no cough or congestion, no dysuria or hematuria.Patient went to urgent care and was found to be tachycardic and hypotensive and sent to the emergency room for evaluation.   Past Medical History:  Diagnosis Date  . Arthritis    knees, shoulder, upper back  . Asthma   . Diabetes mellitus without complication (HCC)   . Environmental allergies   . Fibromyalgia   . GERD (gastroesophageal reflux disease)   . Headache    migraines - 5x/mo  . Hypertension   . Migraines   . Motion sickness    ships  . Sleep apnea   . Thyroid nodule    bilateral and goiter  . Wears contact lenses   . Wears dentures    partial upper    Patient Active Problem List   Diagnosis Date Noted  . Pneumonia 06/03/2017  . Symptomatic cholelithiasis 04/02/2017  . Recurrent incisional hernia 03/17/2017  . Calculus of gallbladder without cholecystitis without obstruction 03/17/2017  . Helicobacter pylori gastritis 03/17/2017  . Community acquired pneumonia 09/28/2016  . Depression 09/28/2016  . Diabetes mellitus, type 2 (HCC) 09/28/2016  . Nausea and vomiting 09/28/2016  . Acute respiratory failure with hypoxemia (HCC)  03/18/2016  . Acute right ankle pain 10/04/2014  . Esophageal reflux 06/22/2012    Past Surgical History:  Procedure Laterality Date  . ABDOMINAL HYSTERECTOMY  2000's  . ABDOMINAL SURGERY     laparoscopy x4 with lysis of adhesions  . APPENDECTOMY  1986  . CESAREAN SECTION  1992  . CHOLECYSTECTOMY N/A 04/03/2017   Procedure: LAPAROSCOPIC CHOLECYSTECTOMY;  Surgeon: Henrene Dodge, MD;  Location: [facility] ORS;  Service: General;  Laterality: N/A;  . COLONOSCOPY WITH PROPOFOL N/A 03/10/2017   Procedure: COLONOSCOPY WITH PROPOFOL;  Surgeon: Scot Jun, MD;  Location: [facility] ENDOSCOPY;  Service: Endoscopy;  Laterality: N/A;  . ECTOPIC PREGNANCY SURGERY  2000's  . ESOPHAGOGASTRODUODENOSCOPY (EGD) WITH PROPOFOL N/A 03/10/2017   Procedure: ESOPHAGOGASTRODUODENOSCOPY (EGD) WITH PROPOFOL;  Surgeon: Scot Jun, MD;  Location: [facility] ENDOSCOPY;  Service: Endoscopy;  Laterality: N/A;  . HERNIA REPAIR    . JOINT REPLACEMENT Right 12/16/12   knee- medial - makoplasty  . KNEE ARTHROSCOPY Right 2006   partial medial and lateral meniscectomies  . KNEE ARTHROSCOPY Right 10/06/2015   Procedure: RIGHT KNEE ARTHROSCOPY WITH DEBRIDEMENT;  Surgeon: Erin Sons, MD;  Location: [facility];  Service: Orthopedics;  Laterality: Right;  Diabetic - insulin and oral meds  . ROTATOR CUFF REPAIR Left 2006  . TUBAL LIGATION  2000's    Prior to Admission medications   Medication Sig Start Date End Date Taking? Authorizing Provider  albuterol (PROVENTIL HFA) 108 (90 Base) MCG/ACT inhaler Inhale 2 puffs into the lungs every 4 (four) hours as needed for wheezing or shortness  of breath. 04/16/17  Yes Shane Crutch, MD  Ascorbic Acid (VITAMIN C) 1000 MG tablet Take 1,000 mg by mouth daily.   Yes [provider]  BIOTIN PO Take 1 tablet by mouth daily.    Yes [provider]  BREO ELLIPTA 200-25 MCG/INH AEPB Inhale 1 puff into the lungs daily. 09/26/17  Yes Shane Crutch, MD    canagliflozin (INVOKANA) 300 MG TABS tablet Take 300 mg by mouth daily before breakfast.   Yes [provider]  diltiazem (DILACOR XR) 240 MG 24 hr capsule Take 240 mg by mouth daily.   Yes [provider]  flunisolide (NASALIDE) 25 MCG/ACT (0.025%) SOLN Place 2 sprays into the nose daily as needed.   Yes [provider]  insulin detemir (LEVEMIR) 100 UNIT/ML injection Inject 10 Units into the skin at bedtime.   Yes [provider]  isosorbide mononitrate (IMDUR) 30 MG 24 hr tablet Take 0.5 tablets (15 mg total) by mouth daily. Patient taking differently: Take 30 mg by mouth daily.  06/06/17  Yes Katha Hamming, MD  LINZESS 290 MCG CAPS capsule Take 1 tablet by mouth daily. 02/28/17  Yes [provider]  losartan (COZAAR) 25 MG tablet Take 25 mg by mouth daily.   Yes [provider]  metFORMIN (GLUCOPHAGE) 1000 MG tablet Take 1,000 mg by mouth 2 (two) times daily with a meal.    Yes [provider]  metoprolol succinate (TOPROL-XL) 50 MG 24 hr tablet Take 50 mg by mouth daily. Take with or immediately following a meal.   Yes [provider]  montelukast (SINGULAIR) 10 MG tablet Take 1 tablet (10 mg total) by mouth daily. 06/30/17 06/30/18 Yes Shane Crutch, MD  Multiple Vitamin (MULTIVITAMIN) tablet Take 1 tablet by mouth daily.   Yes [provider]  omeprazole (PRILOSEC) 40 MG capsule Take 1 capsule (40 mg total) by mouth daily. 1 hr before supper 04/05/17  Yes Piscoya, Jose, MD  pravastatin (PRAVACHOL) 40 MG tablet Take 40 mg by mouth daily.   Yes [provider]  sucralfate (CARAFATE) 1 g tablet Take 1 g by mouth 2 (two) times daily.    Yes [provider]  SUMAtriptan (IMITREX) 25 MG tablet Take 25 mg by mouth every 2 (two) hours as needed for migraine. May repeat in 2 hours if headache persists or recurs.   Yes [provider]  tiZANidine (ZANAFLEX) 4 MG tablet Take 4 mg by  mouth every 6 (six) hours as needed for muscle spasms.   Yes [provider]  zolpidem (AMBIEN) 5 MG tablet Take 5 mg by mouth at bedtime.    Yes [provider]  dexmethylphenidate (FOCALIN) 10 MG tablet Take 10 mg by mouth 2 (two) times daily. 08/16/16   [provider]  DULoxetine (CYMBALTA) 60 MG capsule Take 60 mg by mouth daily. AM    [provider]  furosemide (LASIX) 20 MG tablet Take 40 mg by mouth daily.     [provider]  ipratropium-albuterol (DUONEB) 0.5-2.5 (3) MG/3ML SOLN Take 3 mLs by nebulization every 4 (four) hours as needed.    [provider]  metoCLOPramide (REGLAN) 5 MG tablet Take 1 tablet (5 mg total) by mouth 3 (three) times daily. Patient not taking: Reported on 10/07/2017 06/06/17 06/06/18  Katha Hamming, MD  ondansetron (ZOFRAN) 4 MG tablet Take 1 tablet (4 mg total) by mouth every 8 (eight) hours as needed for nausea or vomiting. 10/07/17   Don Perking, [place],  MD  polyethylene glycol (MIRALAX / GLYCOLAX) packet Take 17 g by mouth daily. Mix one tablespoon with 8oz of your favorite juice or water every day until you are having soft formed stools. Then start taking once daily if you didn't have a stool the day before. 04/17/17   Willy Eddy, MD  predniSONE (DELTASONE) 20 MG tablet Take 3 tablets (60 mg total) by mouth daily. Patient not taking: Reported on 10/07/2017 06/10/17   Jeanmarie Plant, MD  protein supplement shake (PREMIER PROTEIN) LIQD Take 325 mLs (11 oz total) by mouth 3 (three) times daily between meals. 04/05/17   Henrene Dodge, MD  tiotropium (SPIRIVA) 18 MCG inhalation capsule Place 1 capsule (18 mcg total) into inhaler and inhale daily. Patient not taking: Reported on 10/07/2017 06/16/17   Shane Crutch, MD    Allergies Penicillins and Ace inhibitors  Family History  Problem Relation Age of Onset  . Hypertension Mother   . Hypertension Father   . Diabetes Father   . Allergies Father      Social History Social History   Tobacco Use  . Smoking status: Never Smoker  . Smokeless tobacco: Never Used  Substance Use Topics  . Alcohol use: No  . Drug use: No    Review of Systems  Constitutional: Negative for fever. + Chills and generalized weakness Eyes: Negative for visual changes. ENT: Negative for sore throat. Neck: No neck pain  Cardiovascular: Negative for chest pain. Respiratory: Negative for shortness of breath. Gastrointestinal: Negative for abdominal pain. + vomiting and diarrhea. Genitourinary: Negative for dysuria. Musculoskeletal: Negative for back pain. Skin: Negative for rash. Neurological: Negative for headaches, weakness or numbness. Psych: No SI or HI  ____________________________________________   PHYSICAL EXAM:  VITAL SIGNS: ED Triage Vitals  Enc Vitals Group     BP 10/07/17 1234 (!) 96/59     Pulse Rate 10/07/17 1234 (!) 106     Resp 10/07/17 1234 20     Temp 10/07/17 1234 98.7 F (37.1 C)     Temp Source 10/07/17 1234 Oral     SpO2 10/07/17 1234 97 %     Weight 10/07/17 1235 201 lb (91.2 kg)     Height 10/07/17 1235 5\' 5"  (1.651 m)     Head Circumference --      Peak Flow --      Pain Score 10/07/17 1234 8     Pain Loc --      Pain Edu? --      Excl. in GC? --     Constitutional: Alert and oriented. Well appearing and in no apparent distress. HEENT:      Head: Normocephalic and atraumatic.         Eyes: Conjunctivae are normal. Sclera is non-icteric.       Mouth/Throat: Mucous membranes are dry.       Neck: Supple with no signs of meningismus. Cardiovascular: Tachycardic with regular rhythm. No murmurs, gallops, or rubs. 2+ symmetrical distal pulses are present in all extremities. No JVD. Respiratory: Normal respiratory effort. Lungs are clear to auscultation bilaterally. No wheezes, crackles, or rhonchi.  Gastrointestinal: Soft, non tender, and non distended with positive bowel sounds. No rebound or  guarding. Musculoskeletal: Nontender with normal range of motion in all extremities. No edema, cyanosis, or erythema of extremities. Neurologic: Normal speech and language. Face is symmetric. Moving all extremities. No gross focal neurologic deficits are appreciated. Skin: Skin is warm, dry and intact. No rash noted. Psychiatric: Mood and affect are  normal. Speech and behavior are normal.  ____________________________________________   LABS (all labs ordered are listed, but only abnormal results are displayed)  Labs Reviewed  BASIC METABOLIC PANEL - Abnormal; Notable for the following components:      Result Value   Glucose, Bld 116 (*)    Creatinine, Ser 1.57 (*)    GFR calc non Af Amer 37 (*)    GFR calc Af Amer 43 (*)    All other components within normal limits  URINALYSIS, COMPLETE (UACMP) WITH MICROSCOPIC - Abnormal; Notable for the following components:   Color, Urine YELLOW (*)    APPearance HAZY (*)    Glucose, UA 50 (*)    Hgb urine dipstick SMALL (*)    Ketones, ur 5 (*)    Protein, ur 100 (*)    Squamous Epithelial / LPF 0-5 (*)    All other components within normal limits  GLUCOSE, CAPILLARY - Abnormal; Notable for the following components:   Glucose-Capillary 113 (*)    All other components within normal limits  BASIC METABOLIC PANEL - Abnormal; Notable for the following components:   Potassium 3.4 (*)    Chloride 112 (*)    CO2 18 (*)    Creatinine, Ser 1.38 (*)    Calcium 8.0 (*)    GFR calc non Af Amer 43 (*)    GFR calc Af Amer 50 (*)    All other components within normal limits  CBC  INFLUENZA PANEL BY PCR (TYPE A & B)  CBG MONITORING, ED   ____________________________________________  EKG  ED ECG REPORT I, Nita Sicklearolina Soledad Budreau, the attending physician, personally viewed and interpreted this ECG.  Sinus tachycardia, rate of 101, normal intervals, normal axis, no ST elevations or depressions, diffuse T-wave flattening.   ____________________________________________  RADIOLOGY  none  ____________________________________________   PROCEDURES  Procedure(s) performed: None Procedures Critical Care performed:  None ____________________________________________   INITIAL IMPRESSION / ASSESSMENT AND PLAN / ED COURSE  52 y.o. female with a history of fibromyalgia, hypertension, migraines, diabetes who presents for evaluation of generalized weakness and hypotension in the setting of 24 hours of several episodes of NBNB emesis and watery diarrhea. Patient looks dry on exam. Labs consistent with AKI. Electrolytes WNL. Will give 2L NS and repeat BMP. If kidney function improves and patient has no further episodes of vomiting or diarrhea in the ED will dc home.     _________________________ 6:00 PM on 10/07/2017 -----------------------------------------  Patient's creatinine improving after IVF. Patient with no further episodes of vomiting or diarrhea in the ED. Tolerating PO. I discussed with the patient the importance of increasing her oral hydration for the next 3-4 days to help her kidneys returned to baseline. Recommended close follow-up with primary care doctor in 2-3 days to recheck her kidney function. Recommend return to the emergency room if diarrhea or vomit recur. Patient at this time is back to her baseline with normal vital signs. She is stable for discharge home.   As part of my medical decision making, I reviewed the following data within the electronic MEDICAL RECORD NUMBER Nursing notes reviewed and incorporated, Labs reviewed , EKG interpreted , Notes from prior ED visits and [place] Controlled Substance Database    Pertinent labs & imaging results that were available during my care of the patient were reviewed by me and considered in my medical decision making (see chart for details).    ____________________________________________   FINAL CLINICAL IMPRESSION(S) / ED DIAGNOSES  Final diagnoses:  Dehydration  Nausea vomiting and diarrhea  Gastroenteritis      NEW MEDICATIONS STARTED DURING THIS VISIT:  ED Discharge Orders        Ordered    ondansetron (ZOFRAN) 4 MG tablet  Every 8 hours PRN     10/07/17 1757       Note:  This document was prepared using Dragon voice recognition software and may include unintentional dictation errors.    Don Perking, [place], MD 10/07/17 [phone]

## 2017-10-07 NOTE — Discharge Instructions (Signed)
Return to the ER if your diarrhea and vomiting return. Drink lots of fluids to help your kidneys go back to normal. Follow up with your doctor in 3 days for recheck your kidney function. Take zofran as needed for nausea and vomiting.

## 2017-10-07 NOTE — ED Notes (Signed)
AAOx3.  Skin warm and dry.  NAD 

## 2017-10-07 NOTE — ED Triage Notes (Signed)
Pt reports started feeling bad yesterday. Pt reports feels weak, has pain in both hands and was vommitting yesterday and today.  Pt reports called EMS, they checked her out and [facility]

## 2017-10-07 NOTE — ED Notes (Signed)
First nurse note  [facility] with dizziness today and low blood pressure

## 2017-10-10 ENCOUNTER — Emergency Department: Payer: 59

## 2017-10-10 ENCOUNTER — Other Ambulatory Visit: Payer: Self-pay

## 2017-10-10 ENCOUNTER — Emergency Department
Admission: EM | Admit: 2017-10-10 | Discharge: 2017-10-10 | Disposition: A | Discharge status: Home / Self Care | Payer: 59 | Attending: Emergency Medicine | Admitting: Emergency Medicine

## 2017-10-10 DIAGNOSIS — R197 Diarrhea, unspecified: Secondary | ICD-10-CM | POA: Insufficient documentation

## 2017-10-10 DIAGNOSIS — J45909 Unspecified asthma, uncomplicated: Secondary | ICD-10-CM | POA: Insufficient documentation

## 2017-10-10 DIAGNOSIS — Z96651 Presence of right artificial knee joint: Secondary | ICD-10-CM | POA: Insufficient documentation

## 2017-10-10 DIAGNOSIS — E119 Type 2 diabetes mellitus without complications: Secondary | ICD-10-CM | POA: Insufficient documentation

## 2017-10-10 DIAGNOSIS — Z79899 Other long term (current) drug therapy: Secondary | ICD-10-CM | POA: Insufficient documentation

## 2017-10-10 DIAGNOSIS — R112 Nausea with vomiting, unspecified: Secondary | ICD-10-CM | POA: Insufficient documentation

## 2017-10-10 DIAGNOSIS — Z794 Long term (current) use of insulin: Secondary | ICD-10-CM | POA: Insufficient documentation

## 2017-10-10 DIAGNOSIS — I10 Essential (primary) hypertension: Secondary | ICD-10-CM | POA: Insufficient documentation

## 2017-10-10 LAB — CBC WITH DIFFERENTIAL/PLATELET
Basophils Absolute: 0 10*3/uL (ref 0–0.1)
Basophils Relative: 1 %
Eosinophils Absolute: 0 10*3/uL (ref 0–0.7)
Eosinophils Relative: 1 %
HCT: 43.3 % (ref 35.0–47.0)
Hemoglobin: 14.2 g/dL (ref 12.0–16.0)
Lymphocytes Relative: 36 %
Lymphs Abs: 1.7 10*3/uL (ref 1.0–3.6)
MCH: 29.5 pg (ref 26.0–34.0)
MCHC: 32.7 g/dL (ref 32.0–36.0)
MCV: 90 fL (ref 80.0–100.0)
Monocytes Absolute: 0.4 10*3/uL (ref 0.2–0.9)
Monocytes Relative: 9 %
Neutro Abs: 2.4 10*3/uL (ref 1.4–6.5)
Neutrophils Relative %: 53 %
Platelets: 246 10*3/uL (ref 150–440)
RBC: 4.81 MIL/uL (ref 3.80–5.20)
RDW: 14.1 % (ref 11.5–14.5)
WBC: 4.6 10*3/uL (ref 3.6–11.0)

## 2017-10-10 LAB — BASIC METABOLIC PANEL
Anion gap: 14 (ref 5–15)
BUN: 11 mg/dL (ref 6–20)
CO2: 25 mmol/L (ref 22–32)
Calcium: 10.5 mg/dL — ABNORMAL HIGH (ref 8.9–10.3)
Chloride: 105 mmol/L (ref 101–111)
Creatinine, Ser: 1 mg/dL (ref 0.44–1.00)
GFR calc Af Amer: >60 mL/min (ref >60)
GFR calc non Af Amer: >60 mL/min (ref >60)
Glucose, Bld: 123 mg/dL — ABNORMAL HIGH (ref 65–99)
Potassium: 3.3 mmol/L — ABNORMAL LOW (ref 3.5–5.1)
Sodium: 144 mmol/L (ref 135–145)

## 2017-10-10 MED ORDER — PROMETHAZINE HCL 25 MG/ML IJ SOLN
12.5000 mg | Freq: Once | INTRAMUSCULAR | Status: AC
  Administered 2017-10-10: 12.5 mg via INTRAVENOUS
  Filled 2017-10-10: qty 1

## 2017-10-10 MED ORDER — PANTOPRAZOLE SODIUM 40 MG IV SOLR
40.0000 mg | Freq: Once | INTRAVENOUS | Status: AC
  Administered 2017-10-10: 40 mg via INTRAVENOUS
  Filled 2017-10-10 (×2): qty 40

## 2017-10-10 MED ORDER — ONDANSETRON HCL 4 MG/2ML IJ SOLN
4.0000 mg | Freq: Once | INTRAMUSCULAR | Status: AC
  Administered 2017-10-10: 4 mg via INTRAVENOUS
  Filled 2017-10-10: qty 2

## 2017-10-10 MED ORDER — PROMETHAZINE HCL 25 MG/ML IJ SOLN
INTRAMUSCULAR | Status: AC
  Filled 2017-10-10: qty 1

## 2017-10-10 MED ORDER — SODIUM CHLORIDE 0.9 % IV SOLN
1000.0000 mL | Freq: Once | INTRAVENOUS | Status: AC
  Administered 2017-10-10: 1000 mL via INTRAVENOUS

## 2017-10-10 MED ORDER — PROMETHAZINE HCL 25 MG PO TABS: 25.0000 mg | ORAL_TABLET | Freq: Four times a day (QID) | ORAL | 0 refills | Status: DC | PRN

## 2017-10-10 MED ORDER — ONDANSETRON 4 MG PO TBDP: 8.0000 mg | ORAL_TABLET | Freq: Once | ORAL | Status: DC

## 2017-10-10 NOTE — ED Notes (Signed)
Pt reports dizziness and problems with her kidneys states she gets confused at times as well.   Pt states she was here recently, but is unable to keep any liquids down and feels dehydrated. Pt has IV in place from triage.

## 2017-10-10 NOTE — ED Notes (Signed)
Patient transported to X-ray 

## 2017-10-10 NOTE — ED Notes (Signed)
Returned from XR 

## 2017-10-10 NOTE — ED Triage Notes (Signed)
Pt came to ED via pov. Was seen here on the 5th and reports was told she was having kidney issues and dehydrated. Pt reports still has been vomiting and having pain in hands. Was told if does not get better to return. Per daughter, having trouble remembering things as well

## 2017-10-10 NOTE — ED Provider Notes (Signed)
[facility] Emergency Department Provider Note   ____________________________________________    I have reviewed the triage vital signs and the nursing notes.   HISTORY  Chief Complaint Abdominal Pain     HPI Amanda [name] is a 52 y.o. female who presents with complaints of weakness nausea and mild abdominal pain.  Patient with a history as listed below who states over the last 3 days she has had nausea and vomiting and diarrhea with some epigastric discomfort.  She was seen in the emergency department several days ago but reports she continues to feel fatigued and weak.  No fevers or chills reported.  No recent travel.  No blood or bile in the vomitus.  Past Medical History:  Diagnosis Date  . Arthritis    knees, shoulder, upper back  . Asthma   . Diabetes mellitus without complication (HCC)   . Environmental allergies   . Fibromyalgia   . GERD (gastroesophageal reflux disease)   . Headache    migraines - 5x/mo  . Hypertension   . Migraines   . Motion sickness    ships  . Sleep apnea   . Thyroid nodule    bilateral and goiter  . Wears contact lenses   . Wears dentures    partial upper    Patient Active Problem List   Diagnosis Date Noted  . Pneumonia 06/03/2017  . Symptomatic cholelithiasis 04/02/2017  . Recurrent incisional hernia 03/17/2017  . Calculus of gallbladder without cholecystitis without obstruction 03/17/2017  . Helicobacter pylori gastritis 03/17/2017  . Community acquired pneumonia 09/28/2016  . Depression 09/28/2016  . Diabetes mellitus, type 2 (HCC) 09/28/2016  . Nausea and vomiting 09/28/2016  . Acute respiratory failure with hypoxemia (HCC) 03/18/2016  . Acute right ankle pain 10/04/2014  . Esophageal reflux 06/22/2012    Past Surgical History:  Procedure Laterality Date  . ABDOMINAL HYSTERECTOMY  2000's  . ABDOMINAL SURGERY     laparoscopy x4 with lysis of adhesions  . APPENDECTOMY  1986  . CESAREAN  SECTION  1992  . CHOLECYSTECTOMY N/A 04/03/2017   Procedure: LAPAROSCOPIC CHOLECYSTECTOMY;  Surgeon: Henrene DodgePiscoya, Jose, MD;  Location: [facility] ORS;  Service: General;  Laterality: N/A;  . COLONOSCOPY WITH PROPOFOL N/A 03/10/2017   Procedure: COLONOSCOPY WITH PROPOFOL;  Surgeon: Scot JunElliott, Serene Kopf T, MD;  [facility];  Service: Endoscopy;  Laterality: N/A;  . ECTOPIC PREGNANCY SURGERY  2000's  . ESOPHAGOGASTRODUODENOSCOPY (EGD) WITH PROPOFOL N/A 03/10/2017   Procedure: ESOPHAGOGASTRODUODENOSCOPY (EGD) WITH PROPOFOL;  Surgeon: Scot JunElliott, Maribel Hadley T, MD;  [facility];  Service: Endoscopy;  Laterality: N/A;  . HERNIA REPAIR    . JOINT REPLACEMENT Right 12/16/12   knee- medial - makoplasty  . KNEE ARTHROSCOPY Right 2006   partial medial and lateral meniscectomies  . KNEE ARTHROSCOPY Right 10/06/2015   Procedure: RIGHT KNEE ARTHROSCOPY WITH DEBRIDEMENT;  Surgeon: Erin SonsHarold Kernodle, MD;  [facility];  Service: Orthopedics;  Laterality: Right;  Diabetic - insulin and oral meds  . ROTATOR CUFF REPAIR Left 2006  . TUBAL LIGATION  2000's    Prior to Admission medications   Medication Sig Start Date End Date Taking? Authorizing Provider  albuterol (PROVENTIL HFA) 108 (90 Base) MCG/ACT inhaler Inhale 2 puffs into the lungs every 4 (four) hours as needed for wheezing or shortness of breath. 04/16/17   Shane Crutchamachandran, Pradeep, MD  Ascorbic Acid (VITAMIN C) 1000 MG tablet Take 1,000 mg by mouth daily.    [provider]  BIOTIN  PO Take 1 tablet by mouth daily.     [provider]  BREO ELLIPTA 200-25 MCG/INH AEPB Inhale 1 puff into the lungs daily. 09/26/17   Shane Crutch, MD  canagliflozin (INVOKANA) 300 MG TABS tablet Take 300 mg by mouth daily before breakfast.    [provider]  dexmethylphenidate (FOCALIN) 10 MG tablet Take 10 mg by mouth 2 (two) times daily. 08/16/16   [provider]  diltiazem (DILACOR XR) 240 MG 24 hr capsule Take 240  mg by mouth daily.    [provider]  DULoxetine (CYMBALTA) 60 MG capsule Take 60 mg by mouth daily. AM    [provider]  flunisolide (NASALIDE) 25 MCG/ACT (0.025%) SOLN Place 2 sprays into the nose daily as needed.    [provider]  furosemide (LASIX) 20 MG tablet Take 40 mg by mouth daily.     [provider]  insulin detemir (LEVEMIR) 100 UNIT/ML injection Inject 10 Units into the skin at bedtime.    [provider]  ipratropium-albuterol (DUONEB) 0.5-2.5 (3) MG/3ML SOLN Take 3 mLs by nebulization every 4 (four) hours as needed.    [provider]  isosorbide mononitrate (IMDUR) 30 MG 24 hr tablet Take 0.5 tablets (15 mg total) by mouth daily. Patient taking differently: Take 30 mg by mouth daily.  06/06/17   Katha Hamming, MD  LINZESS 290 MCG CAPS capsule Take 1 tablet by mouth daily. 02/28/17   [provider]  losartan (COZAAR) 25 MG tablet Take 25 mg by mouth daily.    [provider]  metFORMIN (GLUCOPHAGE) 1000 MG tablet Take 1,000 mg by mouth 2 (two) times daily with a meal.     [provider]  metoprolol succinate (TOPROL-XL) 50 MG 24 hr tablet Take 50 mg by mouth daily. Take with or immediately following a meal.    [provider]  montelukast (SINGULAIR) 10 MG tablet Take 1 tablet (10 mg total) by mouth daily. 06/30/17 06/30/18  Shane Crutch, MD  Multiple Vitamin (MULTIVITAMIN) tablet Take 1 tablet by mouth daily.    [provider]  omeprazole (PRILOSEC) 40 MG capsule Take 1 capsule (40 mg total) by mouth daily. 1 hr before supper 04/05/17   Piscoya, Elita Quick, MD  ondansetron (ZOFRAN) 4 MG tablet Take 1 tablet (4 mg total) by mouth every 8 (eight) hours as needed for nausea or vomiting. 10/07/17   Don Perking, [place], MD  polyethylene glycol [facility] / Ethelene Hal) packet Take 17 g by mouth daily. Mix one tablespoon with 8oz of your favorite juice or water every day until you are  having soft formed stools. Then start taking once daily if you didn't have a stool the day before. 04/17/17   Willy Eddy, MD  pravastatin (PRAVACHOL) 40 MG tablet Take 40 mg by mouth daily.    [provider]  predniSONE (DELTASONE) 20 MG tablet Take 3 tablets (60 mg total) by mouth daily. Patient not taking: Reported on 10/07/2017 06/10/17   Jeanmarie Plant, MD  promethazine (PHENERGAN) 25 MG tablet Take 1 tablet (25 mg total) by mouth every 6 (six) hours as needed for nausea or vomiting. 10/10/17   Jene Every, MD  protein supplement shake (PREMIER PROTEIN) LIQD Take 325 mLs (11 oz total) by mouth 3 (three) times daily between meals. 04/05/17   Piscoya, Elita Quick, MD  sucralfate (CARAFATE) 1 g tablet Take 1 g by mouth 2 (two) times daily.     [provider]  SUMAtriptan Willette Brace)  25 MG tablet Take 25 mg by mouth every 2 (two) hours as needed for migraine. May repeat in 2 hours if headache persists or recurs.    [provider]  tiotropium (SPIRIVA) 18 MCG inhalation capsule Place 1 capsule (18 mcg total) into inhaler and inhale daily. Patient not taking: Reported on 10/07/2017 06/16/17   Shane Crutch, MD  tiZANidine (ZANAFLEX) 4 MG tablet Take 4 mg by mouth every 6 (six) hours as needed for muscle spasms.    [provider]  zolpidem (AMBIEN) 5 MG tablet Take 5 mg by mouth at bedtime.     [provider]     Allergies Penicillins and Ace inhibitors  Family History  Problem Relation Age of Onset  . Hypertension Mother   . Hypertension Father   . Diabetes Father   . Allergies Father     Social History Social History   Tobacco Use  . Smoking status: Never Smoker  . Smokeless tobacco: Never Used  Substance Use Topics  . Alcohol use: No  . Drug use: No    Review of Systems  Constitutional: No fever/chills Eyes: No visual changes.  ENT: No sore throat. Cardiovascular: Denies chest pain. Respiratory: Denies shortness of  breath. Gastrointestinal: As above Genitourinary: Negative for dysuria. Musculoskeletal: Negative for back pain. Skin: Negative for rash. Neurological: Negative for headaches   ____________________________________________   PHYSICAL EXAM:  VITAL SIGNS: ED Triage Vitals  Enc Vitals Group     BP 10/10/17 1009 118/67     Pulse Rate 10/10/17 1009 84     Resp 10/10/17 1009 20     Temp 10/10/17 1009 99 F (37.2 C)     Temp Source 10/10/17 1009 Oral     SpO2 10/10/17 1009 98 %     Weight 10/10/17 1010 91.2 kg (201 lb)     Height 10/10/17 1010 1.651 m (5\' 5" )     Head Circumference --      Peak Flow --      Pain Score --      Pain Loc --      Pain Edu? --      Excl. in GC? --     Constitutional: Alert and oriented. No acute distress.  Eyes: Conjunctivae are normal.   Nose: No congestion/rhinnorhea. Mouth/Throat: Mucous membranes are moist.    Cardiovascular: Normal rate, regular rhythm. Grossly normal heart sounds.  Good peripheral circulation. Respiratory: Normal respiratory effort.  No retractions. Lungs CTAB. Gastrointestinal: Soft and nontender. No distention.  No CVA tenderness.  Reassuring exam   Musculoskeletal:  Warm and well perfused Neurologic:  Normal speech and language. No gross focal neurologic deficits are appreciated.  Skin:  Skin is warm, dry and intact. No rash noted. Psychiatric: Mood and affect are normal. Speech and behavior are normal.  ____________________________________________   LABS (all labs ordered are listed, but only abnormal results are displayed)  Labs Reviewed  BASIC METABOLIC PANEL - Abnormal; Notable for the following components:      Result Value   Potassium 3.3 (*)    Glucose, Bld 123 (*)    Calcium 10.5 (*)    All other components within normal limits  CBC WITH DIFFERENTIAL/PLATELET   ____________________________________________  EKG  ED ECG REPORT I, Jene Every, the attending physician, personally viewed and  interpreted this ECG.  Date: 10/10/2017  Rhythm: normal sinus rhythm QRS Axis: normal Intervals: normal ST/T Wave abnormalities: normal Narrative Interpretation: no evidence of acute ischemia  ____________________________________________  RADIOLOGY  KUB normal ____________________________________________   PROCEDURES  Procedure(s) performed: No  Procedures   Critical Care performed: No ____________________________________________   INITIAL IMPRESSION / ASSESSMENT AND PLAN / ED COURSE  Pertinent labs & imaging results that were available during my care of the patient were reviewed by me and considered in my medical decision making (see chart for details).  Patient well-appearing in no acute distress.  Abdominal exam is reassuring.  KUB is normal.  Lab work is unremarkable.  Patient treated with IV fluids and Zofran as well as Protonix.  Suspect gastritis, likely viral given her description of symptoms.    Patient treated with IV Phenergan as well for continued nausea this significantly helped her symptoms.  Received IV fluids as well.  Will discharge with supportive care.  Return precautions discussed including worsening nausea vomiting, abdominal pain, weakness or dehydration    ____________________________________________   FINAL CLINICAL IMPRESSION(S) / ED DIAGNOSES  Final diagnoses:  Nausea vomiting and diarrhea        Note:  This document was prepared using Dragon voice recognition software and may include unintentional dictation errors.    Jene Every, MD 10/10/17 [phone]

## 2017-10-14 ENCOUNTER — Emergency Department (HOSPITAL_COMMUNITY): Payer: 59

## 2017-10-14 ENCOUNTER — Observation Stay (HOSPITAL_COMMUNITY): Payer: 59

## 2017-10-14 ENCOUNTER — Observation Stay (HOSPITAL_COMMUNITY)
Admit: 2017-10-14 | Discharge: 2017-10-14 | Disposition: A | Discharge status: Home / Self Care | Payer: 59 | Attending: Internal Medicine | Admitting: Internal Medicine

## 2017-10-14 ENCOUNTER — Observation Stay (HOSPITAL_BASED_OUTPATIENT_CLINIC_OR_DEPARTMENT_OTHER): Payer: 59

## 2017-10-14 ENCOUNTER — Other Ambulatory Visit: Payer: Self-pay

## 2017-10-14 ENCOUNTER — Encounter (HOSPITAL_COMMUNITY): Payer: Self-pay | Admitting: Emergency Medicine

## 2017-10-14 ENCOUNTER — Inpatient Hospital Stay (HOSPITAL_COMMUNITY)
Admission: EM | Admit: 2017-10-14 | Discharge: 2017-10-17 | DRG: 072 | Disposition: A | Discharge status: Home Health | Payer: 59 | Attending: Family Medicine | Admitting: Family Medicine

## 2017-10-14 DIAGNOSIS — F32A Depression, unspecified: Secondary | ICD-10-CM | POA: Diagnosis present

## 2017-10-14 DIAGNOSIS — Z888 Allergy status to other drugs, medicaments and biological substances status: Secondary | ICD-10-CM

## 2017-10-14 DIAGNOSIS — G9340 Encephalopathy, unspecified: Secondary | ICD-10-CM | POA: Diagnosis not present

## 2017-10-14 DIAGNOSIS — E1100 Type 2 diabetes mellitus with hyperosmolarity without nonketotic hyperglycemic-hyperosmolar coma (NKHHC): Secondary | ICD-10-CM | POA: Diagnosis not present

## 2017-10-14 DIAGNOSIS — K219 Gastro-esophageal reflux disease without esophagitis: Secondary | ICD-10-CM | POA: Diagnosis present

## 2017-10-14 DIAGNOSIS — Z972 Presence of dental prosthetic device (complete) (partial): Secondary | ICD-10-CM

## 2017-10-14 DIAGNOSIS — E119 Type 2 diabetes mellitus without complications: Secondary | ICD-10-CM

## 2017-10-14 DIAGNOSIS — Z8639 Personal history of other endocrine, nutritional and metabolic disease: Secondary | ICD-10-CM

## 2017-10-14 DIAGNOSIS — R197 Diarrhea, unspecified: Secondary | ICD-10-CM | POA: Diagnosis present

## 2017-10-14 DIAGNOSIS — G43909 Migraine, unspecified, not intractable, without status migrainosus: Secondary | ICD-10-CM | POA: Diagnosis present

## 2017-10-14 DIAGNOSIS — R030 Elevated blood-pressure reading, without diagnosis of hypertension: Secondary | ICD-10-CM | POA: Diagnosis present

## 2017-10-14 DIAGNOSIS — M170 Bilateral primary osteoarthritis of knee: Secondary | ICD-10-CM | POA: Diagnosis present

## 2017-10-14 DIAGNOSIS — I10 Essential (primary) hypertension: Secondary | ICD-10-CM

## 2017-10-14 DIAGNOSIS — B349 Viral infection, unspecified: Secondary | ICD-10-CM | POA: Diagnosis present

## 2017-10-14 DIAGNOSIS — G4730 Sleep apnea, unspecified: Secondary | ICD-10-CM | POA: Diagnosis present

## 2017-10-14 DIAGNOSIS — E876 Hypokalemia: Secondary | ICD-10-CM | POA: Diagnosis not present

## 2017-10-14 DIAGNOSIS — R4182 Altered mental status, unspecified: Secondary | ICD-10-CM

## 2017-10-14 DIAGNOSIS — M19011 Primary osteoarthritis, right shoulder: Secondary | ICD-10-CM | POA: Diagnosis present

## 2017-10-14 DIAGNOSIS — J45909 Unspecified asthma, uncomplicated: Secondary | ICD-10-CM | POA: Diagnosis present

## 2017-10-14 DIAGNOSIS — R112 Nausea with vomiting, unspecified: Secondary | ICD-10-CM | POA: Diagnosis present

## 2017-10-14 DIAGNOSIS — R413 Other amnesia: Secondary | ICD-10-CM | POA: Diagnosis present

## 2017-10-14 DIAGNOSIS — Z91048 Other nonmedicinal substance allergy status: Secondary | ICD-10-CM

## 2017-10-14 DIAGNOSIS — Z794 Long term (current) use of insulin: Secondary | ICD-10-CM | POA: Diagnosis not present

## 2017-10-14 DIAGNOSIS — Z7951 Long term (current) use of inhaled steroids: Secondary | ICD-10-CM

## 2017-10-14 DIAGNOSIS — Z880 Allergy status to penicillin: Secondary | ICD-10-CM

## 2017-10-14 DIAGNOSIS — G459 Transient cerebral ischemic attack, unspecified: Secondary | ICD-10-CM

## 2017-10-14 DIAGNOSIS — M479 Spondylosis, unspecified: Secondary | ICD-10-CM | POA: Diagnosis present

## 2017-10-14 DIAGNOSIS — R410 Disorientation, unspecified: Secondary | ICD-10-CM

## 2017-10-14 DIAGNOSIS — Z96651 Presence of right artificial knee joint: Secondary | ICD-10-CM | POA: Diagnosis present

## 2017-10-14 DIAGNOSIS — R000 Tachycardia, unspecified: Secondary | ICD-10-CM | POA: Diagnosis present

## 2017-10-14 DIAGNOSIS — Z79899 Other long term (current) drug therapy: Secondary | ICD-10-CM

## 2017-10-14 DIAGNOSIS — R29710 NIHSS score 10: Secondary | ICD-10-CM | POA: Diagnosis present

## 2017-10-14 DIAGNOSIS — M19012 Primary osteoarthritis, left shoulder: Secondary | ICD-10-CM | POA: Diagnosis present

## 2017-10-14 DIAGNOSIS — F329 Major depressive disorder, single episode, unspecified: Secondary | ICD-10-CM | POA: Diagnosis present

## 2017-10-14 DIAGNOSIS — G43A0 Cyclical vomiting, not intractable: Secondary | ICD-10-CM | POA: Diagnosis not present

## 2017-10-14 DIAGNOSIS — M797 Fibromyalgia: Secondary | ICD-10-CM | POA: Diagnosis present

## 2017-10-14 HISTORY — DX: Hypokalemia: E87.6

## 2017-10-14 LAB — ECHOCARDIOGRAM COMPLETE
Height: 65 in
Weight: 3216 oz

## 2017-10-14 LAB — COMPREHENSIVE METABOLIC PANEL
ALT: 51 U/L (ref 14–54)
AST: 47 U/L — ABNORMAL HIGH (ref 15–41)
Albumin: 4.5 g/dL (ref 3.5–5.0)
Alkaline Phosphatase: 125 U/L (ref 38–126)
Anion gap: 13 (ref 5–15)
BUN: 18 mg/dL (ref 6–20)
CO2: 22 mmol/L (ref 22–32)
Calcium: 9.9 mg/dL (ref 8.9–10.3)
Chloride: 110 mmol/L (ref 101–111)
Creatinine, Ser: 0.99 mg/dL (ref 0.44–1.00)
GFR calc Af Amer: >60 mL/min (ref >60)
GFR calc non Af Amer: >60 mL/min (ref >60)
Glucose, Bld: 142 mg/dL — ABNORMAL HIGH (ref 65–99)
Potassium: 3.4 mmol/L — ABNORMAL LOW (ref 3.5–5.1)
Sodium: 145 mmol/L (ref 135–145)
Total Bilirubin: 0.1 mg/dL — ABNORMAL LOW (ref 0.3–1.2)
Total Protein: 8.4 g/dL — ABNORMAL HIGH (ref 6.5–8.1)

## 2017-10-14 LAB — CBC
HCT: 44.5 % (ref 36.0–46.0)
Hemoglobin: 14.7 g/dL (ref 12.0–15.0)
MCH: 29.9 pg (ref 26.0–34.0)
MCHC: 33 g/dL (ref 30.0–36.0)
MCV: 90.6 fL (ref 78.0–100.0)
Platelets: 278 10*3/uL (ref 150–400)
RBC: 4.91 MIL/uL (ref 3.87–5.11)
RDW: 14.1 % (ref 11.5–15.5)
WBC: 5.6 10*3/uL (ref 4.0–10.5)

## 2017-10-14 LAB — URINALYSIS, ROUTINE W REFLEX MICROSCOPIC
Bacteria, UA: NONE SEEN
Bilirubin Urine: NEGATIVE
Glucose, UA: NEGATIVE mg/dL
Ketones, ur: NEGATIVE mg/dL
Leukocytes, UA: NEGATIVE
Nitrite: NEGATIVE
Protein, ur: NEGATIVE mg/dL
Specific Gravity, Urine: 1.018 (ref 1.005–1.030)
pH: 7 (ref 5.0–8.0)

## 2017-10-14 LAB — LIPID PANEL
Cholesterol: 162 mg/dL (ref 0–200)
HDL: 73 mg/dL (ref >40)
LDL Cholesterol: 72 mg/dL (ref 0–99)
Total CHOL/HDL Ratio: 2.2 RATIO
Triglycerides: 86 mg/dL (ref <150)
VLDL: 17 mg/dL (ref 0–40)

## 2017-10-14 LAB — GLUCOSE, CAPILLARY
Glucose-Capillary: 126 mg/dL — ABNORMAL HIGH (ref 65–99)
Glucose-Capillary: 143 mg/dL — ABNORMAL HIGH (ref 65–99)
Glucose-Capillary: 124 mg/dL — ABNORMAL HIGH (ref 65–99)

## 2017-10-14 LAB — VITAMIN B12: Vitamin B-12: 463 pg/mL (ref 180–914)

## 2017-10-14 LAB — HEMOGLOBIN A1C
Hgb A1c MFr Bld: 7.6 % — ABNORMAL HIGH (ref 4.8–5.6)
Mean Plasma Glucose: 171.42 mg/dL

## 2017-10-14 LAB — RAPID URINE DRUG SCREEN, HOSP PERFORMED
Amphetamines: NOT DETECTED
Barbiturates: POSITIVE — AB
Benzodiazepines: NOT DETECTED
Cocaine: NOT DETECTED
Opiates: NOT DETECTED
Tetrahydrocannabinol: NOT DETECTED

## 2017-10-14 LAB — CBG MONITORING, ED: Glucose-Capillary: 133 mg/dL — ABNORMAL HIGH (ref 65–99)

## 2017-10-14 LAB — TSH: TSH: 1.442 u[IU]/mL (ref 0.350–4.500)

## 2017-10-14 LAB — AMMONIA: Ammonia: 20 umol/L (ref 9–35)

## 2017-10-14 MED ORDER — ACETAMINOPHEN 325 MG PO TABS
650.0000 mg | ORAL_TABLET | ORAL | Status: DC | PRN
  Administered 2017-10-14 – 2017-10-15 (×4): 650 mg via ORAL
  Filled 2017-10-14 (×6): qty 2

## 2017-10-14 MED ORDER — ENOXAPARIN SODIUM 40 MG/0.4ML ~~LOC~~ SOLN
40.0000 mg | SUBCUTANEOUS | Status: DC
  Administered 2017-10-14: 40 mg via SUBCUTANEOUS
  Filled 2017-10-14: qty 0.4

## 2017-10-14 MED ORDER — STROKE: EARLY STAGES OF RECOVERY BOOK
Freq: Once | Status: AC
  Administered 2017-10-14: 12:00:00
  Filled 2017-10-14: qty 1

## 2017-10-14 MED ORDER — INSULIN ASPART 100 UNIT/ML ~~LOC~~ SOLN
0.0000 [IU] | Freq: Three times a day (TID) | SUBCUTANEOUS | Status: DC
  Administered 2017-10-14: 1 [IU] via SUBCUTANEOUS
  Administered 2017-10-15 – 2017-10-16 (×4): 2 [IU] via SUBCUTANEOUS
  Administered 2017-10-16 – 2017-10-17 (×3): 3 [IU] via SUBCUTANEOUS

## 2017-10-14 MED ORDER — ASPIRIN 325 MG PO TABS
325.0000 mg | ORAL_TABLET | Freq: Every day | ORAL | Status: DC
  Administered 2017-10-14 – 2017-10-17 (×4): 325 mg via ORAL
  Filled 2017-10-14 (×4): qty 1

## 2017-10-14 MED ORDER — INSULIN ASPART 100 UNIT/ML ~~LOC~~ SOLN
0.0000 [IU] | SUBCUTANEOUS | Status: DC
  Administered 2017-10-14: 1 [IU] via SUBCUTANEOUS

## 2017-10-14 MED ORDER — IOPAMIDOL (ISOVUE-370) INJECTION 76%
100.0000 mL | Freq: Once | INTRAVENOUS | Status: AC | PRN
  Administered 2017-10-14: 80 mL via INTRAVENOUS

## 2017-10-14 MED ORDER — HYDRALAZINE HCL 20 MG/ML IJ SOLN
10.0000 mg | Freq: Four times a day (QID) | INTRAMUSCULAR | Status: DC | PRN
  Administered 2017-10-15: 10 mg via INTRAVENOUS
  Filled 2017-10-14: qty 1

## 2017-10-14 MED ORDER — ONDANSETRON HCL 4 MG/2ML IJ SOLN
4.0000 mg | Freq: Four times a day (QID) | INTRAMUSCULAR | Status: DC | PRN
  Administered 2017-10-14 – 2017-10-15 (×2): 4 mg via INTRAVENOUS
  Filled 2017-10-14 (×2): qty 2

## 2017-10-14 MED ORDER — SODIUM CHLORIDE 0.9 % IV SOLN: INTRAVENOUS | Status: DC

## 2017-10-14 MED ORDER — PANTOPRAZOLE SODIUM 40 MG IV SOLR
40.0000 mg | INTRAVENOUS | Status: DC
  Administered 2017-10-14 – 2017-10-16 (×3): 40 mg via INTRAVENOUS
  Filled 2017-10-14 (×3): qty 40

## 2017-10-14 MED ORDER — ASPIRIN 300 MG RE SUPP: 300.0000 mg | Freq: Every day | RECTAL | Status: DC

## 2017-10-14 MED ORDER — POTASSIUM CHLORIDE IN NACL 20-0.9 MEQ/L-% IV SOLN
INTRAVENOUS | Status: DC
  Administered 2017-10-14 – 2017-10-16 (×4): via INTRAVENOUS

## 2017-10-14 MED ORDER — SENNOSIDES-DOCUSATE SODIUM 8.6-50 MG PO TABS: 1.0000 | ORAL_TABLET | Freq: Every evening | ORAL | Status: DC | PRN

## 2017-10-14 MED ORDER — ACETAMINOPHEN 650 MG RE SUPP
650.0000 mg | RECTAL | Status: DC | PRN
Start: 2017-10-14 — End: 2017-10-17

## 2017-10-14 MED ORDER — INSULIN ASPART 100 UNIT/ML ~~LOC~~ SOLN: 0.0000 [IU] | Freq: Every day | SUBCUTANEOUS | Status: DC

## 2017-10-14 MED ORDER — ACETAMINOPHEN 160 MG/5ML PO SOLN: 650.0000 mg | ORAL | Status: DC | PRN

## 2017-10-14 NOTE — Plan of Care (Signed)
  Acute Rehab PT Goals(only PT should resolve) Pt Will Go Supine/Side To Sit 10/14/2017 1546 - Progressing by Ocie BobWatkins, Gwendalynn Eckstrom, PT Flowsheets Taken 10/14/2017 1546  Pt will go Supine/Side to Sit with modified independence Patient Will Transfer Sit To/From Stand 10/14/2017 1546 - Progressing by Ocie BobWatkins, Christee Mervine, PT Flowsheets Taken 10/14/2017 1546  Patient will transfer sit to/from stand with supervision Pt Will Transfer Bed To Chair/Chair To Bed 10/14/2017 1546 - Progressing by Ocie BobWatkins, Stephane Niemann, PT Flowsheets Taken 10/14/2017 1546  Pt will Transfer Bed to Chair/Chair to Bed with supervision Pt Will Ambulate 10/14/2017 1546 - Progressing by Ocie BobWatkins, Coleen Cardiff, PT Flowsheets Taken 10/14/2017 1546  Pt will Ambulate 50 feet;with min guard assist;with rolling walker  3:47 PM, 10/14/17 Ocie BobJames Usman [name], MPT Physical Therapist with [facility] [phone] office [phone] mobile phone

## 2017-10-14 NOTE — Procedures (Signed)
EEG Report  Clinical History: Altered mental status for 4 days.  Recent gastroenteritis.  CT Brain was negative.    Technical Summary:  A 19 channel digital EEG recording was performed using the 10-20 international system of electrode placement.  Bipolar and Referential montages were used.  The total recording time was approx 20 minutes.  Findings:  There is a posterior dominant rhythm of 7 Hz which is symmetrical.  No focal slowing is present.  Intermittently, there is generalized delta frequency slowing for a few seconds while awake.  There are no epileptiform discharges or electrographic seizures.  Hyperventilation and photic stimulation are not performed.  Impression:  This is an abnormal EEG.  There is evidence of mild generalized slowing of brain activity which intermittently becomes severe in nature for short bursts of time.  This is non-specific, but may be due to a toxic, metabolic, or infectious encephalopathy.  Clinical correlation is recommended.  The patient is not in non-convulsive status epilepticus.    Robin SearingShervin Torion Hulgan,MS, MD

## 2017-10-14 NOTE — Plan of Care (Signed)
Progressing. I gave the stroke handout to patient and daughter. We went over the S&S

## 2017-10-14 NOTE — H&P (Addendum)
TRH H&P   Patient Demographics:    Amanda [name], is a 52 y.o. female  MRN: [id number]   DOB - [birth year]  Admit Date - 10/14/2017  Outpatient Primary MD for the patient is Margaretann Loveless, MD  Referring MD Dr. Preston Fleeting  Outpatient Specialists: None  Patient coming from: Home  Chief Complaint  Patient presents with  . Altered Mental Status      HPI:    Amanda [name]  is a 52 y.o. female, with history of diabetes mellitus type 2 on oral hypoglycemics, arthritis, fibromyalgia, GERD, hypertension and history of migraines was brought to the ED by family with progressive decline in mental status for past 4 days.  Patient was seen at [facility] 1 week ago with acute gastroenteritis with symptoms of nausea, vomiting and diarrhea and found to have mild AK I.  She got IV fluids in the ED and was discharged home.  Since then family has found her to be less interactive, sleepy most of the time and confused.  She was then taken to the ED 3 days later with complaints of generalized weakness, nausea and some abdominal pain.  She was again given IV fluids, abdominal x-ray unremarkable and given antiemetics for suspected gastroenteritis.  Since then she has been increasingly confused and unable to communicate properly, has been confused even telling her name or calling out for her children.  History provided by patient's son and daughter at bedside who informed that patient was complaining of numbness in the left side of her arm and face, complaint of some chills and was still having off-and-on vomiting and loose stools. Family denies any recent change in her medications, recent illness, travel or medication overdose. Prior to 1 week back patient was completely normal and independent. Family denies any recent fevers, complains of headache, blurred vision, dizziness, fall at home, syncope,  complains of chest pain, shortness of breath, dysuria or recent trauma.  Denies any witnessed seizures or prior history of seizures.   In the ED patient was found to be mildly tachycardic in low 100s, afebrile, blood pressure elevated in the range of systolic 140-170 and diastolic 87-94 mmHg.  Blood work showed normal CBC, potassium of 3.4, normal renal function and glucose of 142.  LFTs were normal except for mildly elevated AST.  UA was negative for infection. Head CT negative for acute findings.  Patient has multiple piercing in her shoulder and back and could not get an MRI. Hospitalist consulted for observation   Review of systems:    In addition to the HPI above,  No Fever, chills + No Headache, No changes with Vision or hearing, No problems swallowing food or Liquids, No Chest pain, Cough or Shortness of Breath, Abdominal pain, nausea, vomiting and diarrhea No Blood in stool or Urine, No dysuria, No new skin rashes or bruises, No new joints pains-aches,  Generalized weakness, numbness of the face and extremity  No recent weight gain or loss, No polyuria, polydypsia or polyphagia, No significant Mental Stressors. Increased confusion   With Past History of the following :    Past Medical History:  Diagnosis Date  . Arthritis    knees, shoulder, upper back  . Asthma   . Diabetes mellitus without complication (HCC)   . Environmental allergies   . Fibromyalgia   . GERD (gastroesophageal reflux disease)   . Headache    migraines - 5x/mo  . Hypertension   . Migraines   . Motion sickness    ships  . Sleep apnea   . Thyroid nodule    bilateral and goiter  . Wears contact lenses   . Wears dentures    partial upper      Past Surgical History:  Procedure Laterality Date  . ABDOMINAL HYSTERECTOMY  2000's  . ABDOMINAL SURGERY     laparoscopy x4 with lysis of adhesions  . APPENDECTOMY  1986  . CESAREAN SECTION  1992  . CHOLECYSTECTOMY N/A 04/03/2017   Procedure:  LAPAROSCOPIC CHOLECYSTECTOMY;  Surgeon: Henrene Dodge, MD;  Location: [facility] ORS;  Service: General;  Laterality: N/A;  . COLONOSCOPY WITH PROPOFOL N/A 03/10/2017   Procedure: COLONOSCOPY WITH PROPOFOL;  Surgeon: Scot Jun, MD;  Location: [facility] ENDOSCOPY;  Service: Endoscopy;  Laterality: N/A;  . ECTOPIC PREGNANCY SURGERY  2000's  . ESOPHAGOGASTRODUODENOSCOPY (EGD) WITH PROPOFOL N/A 03/10/2017   Procedure: ESOPHAGOGASTRODUODENOSCOPY (EGD) WITH PROPOFOL;  Surgeon: Scot Jun, MD;  Location: [facility] ENDOSCOPY;  Service: Endoscopy;  Laterality: N/A;  . HERNIA REPAIR    . JOINT REPLACEMENT Right 12/16/12   knee- medial - makoplasty  . KNEE ARTHROSCOPY Right 2006   partial medial and lateral meniscectomies  . KNEE ARTHROSCOPY Right 10/06/2015   Procedure: RIGHT KNEE ARTHROSCOPY WITH DEBRIDEMENT;  Surgeon: Erin Sons, MD;  Location: [facility];  Service: Orthopedics;  Laterality: Right;  Diabetic - insulin and oral meds  . ROTATOR CUFF REPAIR Left 2006  . TUBAL LIGATION  2000's      Social History:     Social History   Tobacco Use  . Smoking status: Never Smoker  . Smokeless tobacco: Never Used  Substance Use Topics  . Alcohol use: No     Lives -home with son  Mobility -independent prior to acute illness     Family History :     Family History  Problem Relation Age of Onset  . Hypertension Mother   . Hypertension Father   . Diabetes Father   . Allergies Father       Home Medications:   Prior to Admission medications   Medication Sig Start Date End Date Taking? Authorizing Provider  albuterol (PROVENTIL HFA) 108 (90 Base) MCG/ACT inhaler Inhale 2 puffs into the lungs every 4 (four) hours as needed for wheezing or shortness of breath. 04/16/17   Shane Crutch, MD  Ascorbic Acid (VITAMIN C) 1000 MG tablet Take 1,000 mg by mouth daily.    [provider]  BIOTIN PO Take 1 tablet by mouth daily.     [provider]  BREO ELLIPTA  200-25 MCG/INH AEPB Inhale 1 puff into the lungs daily. 09/26/17   Shane Crutch, MD  canagliflozin (INVOKANA) 300 MG TABS tablet Take 300 mg by mouth daily before breakfast.    [provider]  dexmethylphenidate (FOCALIN) 10 MG tablet Take 10 mg by mouth 2 (two) times daily. 08/16/16   [provider]  diltiazem (DILACOR XR) 240 MG 24 hr capsule Take 240 mg by mouth daily.    [provider]  DULoxetine (CYMBALTA) 60 MG capsule Take 60 mg by mouth daily. AM    [provider]  flunisolide (NASALIDE) 25 MCG/ACT (0.025%) SOLN Place 2 sprays into the nose daily as needed.    [provider]  furosemide (LASIX) 20 MG tablet Take 40 mg by mouth daily.     [provider]  insulin detemir (LEVEMIR) 100 UNIT/ML injection Inject 10 Units into the skin at bedtime.    [provider]  ipratropium-albuterol (DUONEB) 0.5-2.5 (3) MG/3ML SOLN Take 3 mLs by nebulization every 4 (four) hours as needed.    [provider]  isosorbide mononitrate (IMDUR) 30 MG 24 hr tablet Take 0.5 tablets (15 mg total) by mouth daily. Patient taking differently: Take 30 mg by mouth daily.  06/06/17   Katha HammingKonidena, Snehalatha, MD  LINZESS 290 MCG CAPS capsule Take 1 tablet by mouth daily. 02/28/17   [provider]  losartan (COZAAR) 25 MG tablet Take 25 mg by mouth daily.    [provider]  metFORMIN (GLUCOPHAGE) 1000 MG tablet Take 1,000 mg by mouth 2 (two) times daily with a meal.     [provider]  metoprolol succinate (TOPROL-XL) 50 MG 24 hr tablet Take 50 mg by mouth daily. Take with or immediately following a meal.    [provider]  montelukast (SINGULAIR) 10 MG tablet Take 1 tablet (10 mg total) by mouth daily. 06/30/17 06/30/18  Shane Crutchamachandran, Pradeep, MD  Multiple Vitamin (MULTIVITAMIN) tablet Take 1 tablet by mouth daily.    [provider]  omeprazole (PRILOSEC) 40 MG capsule Take 1 capsule (40 mg  total) by mouth daily. 1 hr before supper 04/05/17   Piscoya, Elita QuickJose, MD  ondansetron (ZOFRAN) 4 MG tablet Take 1 tablet (4 mg total) by mouth every 8 (eight) hours as needed for nausea or vomiting. 10/07/17   Don PerkingVeronese, [place], MD  polyethylene glycol [facility](MIRALAX / Ethelene HalGLYCOLAX) packet Take 17 g by mouth daily. Mix one tablespoon with 8oz of your favorite juice or water every day until you are having soft formed stools. Then start taking once daily if you didn't have a stool the day before. 04/17/17   Willy Eddyobinson, Patrick, MD  pravastatin (PRAVACHOL) 40 MG tablet Take 40 mg by mouth daily.    [provider]  predniSONE (DELTASONE) 20 MG tablet Take 3 tablets (60 mg total) by mouth daily. Patient not taking: Reported on 10/07/2017 06/10/17   Jeanmarie PlantMcShane, James A, MD  promethazine (PHENERGAN) 25 MG tablet Take 1 tablet (25 mg total) by mouth every 6 (six) hours as needed for nausea or vomiting. 10/10/17   Jene EveryKinner, Robert, MD  protein supplement shake (PREMIER PROTEIN) LIQD Take 325 mLs (11 oz total) by mouth 3 (three) times daily between meals. 04/05/17   Piscoya, Elita QuickJose, MD  sucralfate (CARAFATE) 1 g tablet Take 1 g by mouth 2 (two) times daily.     [provider]  SUMAtriptan (IMITREX) 25 MG tablet Take 25 mg by mouth every 2 (two) hours as needed for migraine. May repeat in 2 hours if headache persists or recurs.    [provider]  tiotropium (SPIRIVA) 18 MCG inhalation capsule Place 1 capsule (18 mcg total) into inhaler and inhale daily. Patient not taking: Reported on 10/07/2017 06/16/17   Shane Crutchamachandran, Pradeep, MD  tiZANidine (ZANAFLEX) 4 MG tablet Take 4 mg by mouth every 6 (  six) hours as needed for muscle spasms.    [provider]  zolpidem (AMBIEN) 5 MG tablet Take 5 mg by mouth at bedtime.     [provider]     Allergies:     Allergies  Allergen Reactions  . Penicillins Anaphylaxis    Has patient had a PCN reaction causing immediate rash, facial/tongue/throat  swelling, SOB or lightheadedness with hypotension: Yes Has patient had a PCN reaction causing severe rash involving mucus membranes or skin necrosis: No Has patient had a PCN reaction that required hospitalization No Has patient had a PCN reaction occurring within the last 10 years: No If all of the above answers are "NO", then may proceed with Cephalosporin use.   . Ace Inhibitors Swelling     Physical Exam:   Vitals  Blood pressure (!) 154/96, pulse 91, temperature 98.2 F (36.8 C), temperature source Oral, resp. rate 16, height 5\' 5"  (1.651 m), weight 91.2 kg (201 lb), SpO2 100 %.   Middle-aged female lying in bed, not in distress, somnolent but easily arousable HEENT: Pupils reactive bilaterally, EOMI, no pallor, no icterus, moist oral mucosa, supple neck, no cervical lymphadenopathy Chest: Clear to auscultation bilaterally, no added sound CVS: S1-S2 tachycardic, no murmur rub or gallop GI: Soft, nondistended, bowel sounds present, mild periumbilical tenderness Musculoskeletal: Warm, no edema, normal skin CNS: Sleepy but easily arousable, oriented to person (recognizes her kids by name but unable to tell her name), responds to simple commands, moving all extremities but has weakness on her right arm on full elevation and appears to have poor hand grip, normal muscle tone and reflex, unable to assess sensations or gait due to poor patient participation.   Data Review:    CBC Recent Labs  Lab 10/07/17 1233 10/10/17 1028 10/14/17 0545  WBC 6.9 4.6 5.6  HGB 14.8 14.2 14.7  HCT 45.5 43.3 44.5  PLT 275 246 278  MCV 90.8 90.0 90.6  MCH 29.5 29.5 29.9  MCHC 32.5 32.7 33.0  RDW 13.6 14.1 14.1  LYMPHSABS  --  1.7  --   MONOABS  --  0.4  --   EOSABS  --  0.0  --   BASOSABS  --  0.0  --    ------------------------------------------------------------------------------------------------------------------  Chemistries  Recent Labs  Lab 10/07/17 1233 10/07/17 1646  10/10/17 1028 10/14/17 0545  NA 140 141 144 145  K 3.8 3.4* 3.3* 3.4*  CL 104 112* 105 110  CO2 23 18* 25 22  GLUCOSE 116* 86 123* 142*  BUN 14 15 11 18   CREATININE 1.57* 1.38* 1.00 0.99  CALCIUM 9.9 8.0* 10.5* 9.9  AST  --   --   --  47*  ALT  --   --   --  51  ALKPHOS  --   --   --  125  BILITOT  --   --   --  0.1*   ------------------------------------------------------------------------------------------------------------------ estimated creatinine clearance is 74.2 mL/min (by C-G formula based on SCr of 0.99 mg/dL). ------------------------------------------------------------------------------------------------------------------ No results for input(s): TSH, T4TOTAL, T3FREE, THYROIDAB in the last 72 hours.  Invalid input(s): FREET3  Coagulation profile No results for input(s): INR, PROTIME in the last 168 hours. ------------------------------------------------------------------------------------------------------------------- No results for input(s): DDIMER in the last 72 hours. -------------------------------------------------------------------------------------------------------------------  Cardiac Enzymes No results for input(s): CKMB, TROPONINI, MYOGLOBIN in the last 168 hours.  Invalid input(s): CK ------------------------------------------------------------------------------------------------------------------    Component Value Date/Time   BNP 24.0 06/10/2017 1059     ---------------------------------------------------------------------------------------------------------------  Urinalysis    Component Value Date/Time   COLORURINE YELLOW 10/14/2017 0629   APPEARANCEUR CLEAR 10/14/2017 0629   APPEARANCEUR Cloudy 11/12/2011 1721   LABSPEC 1.018 10/14/2017 0629   LABSPEC 1.010 11/12/2011 1721   PHURINE 7.0 10/14/2017 0629   GLUCOSEU NEGATIVE 10/14/2017 0629   GLUCOSEU Negative 11/12/2011 1721   HGBUR LARGE (A) 10/14/2017 0629   BILIRUBINUR NEGATIVE  10/14/2017 0629   BILIRUBINUR Negative 11/12/2011 1721   KETONESUR NEGATIVE 10/14/2017 0629   PROTEINUR NEGATIVE 10/14/2017 0629   NITRITE NEGATIVE 10/14/2017 0629   LEUKOCYTESUR NEGATIVE 10/14/2017 0629   LEUKOCYTESUR 1+ 11/12/2011 1721    ----------------------------------------------------------------------------------------------------------------   Imaging Results:    Dg Chest 1 View  Result Date: 10/14/2017 CLINICAL DATA:  Chest pain and shortness of breath. History of asthma, hypertension, and diabetes. Nonsmoker. EXAM: CHEST 1 VIEW COMPARISON:  Chest x-ray of June 10, 2017 FINDINGS: The lungs are adequately inflated and clear. The heart is top-normal in size. The pulmonary vascularity is normal. There is no pleural effusion. The trachea is midline. The bony thorax exhibits no acute abnormality. IMPRESSION: There is no active cardiopulmonary disease. Electronically Signed   By: David  [place] M.D.   On: 10/14/2017 07:26   Ct Head Wo Contrast  Result Date: 10/14/2017 CLINICAL DATA:  Altered level of consciousness today. EXAM: CT HEAD WITHOUT CONTRAST TECHNIQUE: Contiguous axial images were obtained from the base of the skull through the vertex without intravenous contrast. COMPARISON:  Head CT scan 12/01/2015. FINDINGS: Brain: No evidence of acute infarction, hemorrhage, hydrocephalus, extra-axial collection or mass lesion/mass effect. Vascular: No hyperdense vessel or unexpected calcification. Skull: Intact. Sinuses/Orbits: Negative. Other: None. IMPRESSION: Negative head CT. Electronically Signed   By: Drusilla Kanner M.D.   On: 10/14/2017 07:40    My personal review of EKG: Normal sinus rhythm at 79, no ST-T changes   Assessment & Plan:    Principal Problem:   Acute encephalopathy No clear etiology at present.  Recent history of acute gastroenteritis with nausea vomiting and diarrhea along with mild AK I that had resolved with IV hydration. Head CT negative for acute findings.   Given her multiple body piercing cannot get an MRI. With acute change in her mental status and concern for weakness in her upper extremity with complaints of numbness will rule her out for stroke.  Obtain CT angiogram of the head and neck and 2D echo.  Check lipid panel and A1c. PT/OT evaluation, swallow evaluation.  Keep n.p.o. Check EEG to rule out postictal state. TSH and ammonia normal.  Check B12, RPR and HIV antibody.  Chest x-ray and UA negative for infection.  Check urine drug screen.   Patient is on Imitrex for migraine and and as needed Zanaflex for pain, Cymbalta for depression, methylphenidate for?,  And Ambien at bedtime.  All of these will be held. Avoid benzos or narcotics.  Neurology consulted for further evaluation and recommendations.  Active Problems:   Diabetes mellitus, type 2 (HCC) Hold home medication and monitor on sliding scale coverage.    Esophageal reflux Placed on IV PPI daily    Nausea vomiting and diarrhea. As per family have improved but still present.  Will check stool studies if she has further diarrhea.    Hypokalemia Replenished  Essential hypertension Blood pressure elevated on presentation.  Holding home medications.  Place on as needed hydralazine     DVT Prophylaxis: Subcu Lovenox AM Labs Ordered, also please review Full Orders  Family Communication: Admission, patients condition and  plan of care including tests being ordered have been discussed with the patient's son and daughter at bedside   Code Status full code  Likely DC to home  Condition GUARDED    Consults called: Neurology  Admission status: Observation  Time spent in minutes : 55   Adalena Abdulla M.D on 10/14/2017 at 9:32 AM  Between 7am to 7pm - Pager - [phone]. After 7pm go to [email or web address] - password [email or web address]  [facility] - Office  [phone]

## 2017-10-14 NOTE — Plan of Care (Signed)
  No Outcome SLP Cognition Goals Patient will demonstrate attention to functional Description Patient will demonstrate attention to functional task with 10/14/2017 1827 by Dorene ArPorter, Dabney V, CCC-SLP Flowsheets Taken 10/14/2017 1827  Patient will demonstrate _____ attention to functional task with ____ sustained;mod assist Patient will utilize external memory aids Description Patient will utilize external memory aids to facilitate recall of information for improved safety with 10/14/2017 1827 by Dorene ArPorter, Dabney V, CCC-SLP Flowsheets Taken 10/14/2017 1827  Patient will utilize external memory aids to facilitate recall of ____ information for improved safety with ______ mod assist Note Functional  SLP Language Goals Patient will follow commands during a functional ADL with 10/14/2017 1827 by Dorene ArPorter, Dabney V, CCC-SLP Flowsheets Taken 10/14/2017 1827  Patient will follow ____ commands during a functional ADL with ____ 2 step;mod assist

## 2017-10-14 NOTE — Progress Notes (Signed)
EEG Completed; Results Pending  

## 2017-10-14 NOTE — Evaluation (Signed)
Physical Therapy Evaluation Patient Details Name: Amanda [name] MRN: [id number] DOB: 1965-11-30 Today's Date: 10/14/2017   History of Present Illness  Amanda [name]  is a 52 y.o. female, with history of diabetes mellitus type 2 on oral hypoglycemics, arthritis, fibromyalgia, GERD, hypertension and history of migraines was brought to the ED by family with progressive decline in mental status for past 4 days.  Patient was seen at [facility] 1 week ago with acute gastroenteritis with symptoms of nausea, vomiting and diarrhea and found to have mild AK I.  She got IV fluids in the ED and was discharged home.  Since then family has found her to be less interactive, sleepy most of the time and confused.  She was then taken to the ED 3 days later with complaints of generalized weakness, nausea and some abdominal pain.  She was again given IV fluids, abdominal x-ray unremarkable and given antiemetics for suspected gastroenteritis.  Since then she has been increasingly confused and unable to communicate properly, has been confused even telling her name or calling out for her children.  History provided by patient's son and daughter at bedside who informed that patient was complaining of numbness in the left side of her arm and face, complaint of some chills and was still having off-and-on vomiting and loose stools.    Clinical Impression  Patient mostly limited for out of bed and gait due to c/o severe nausea (RN aware) and poor standing balance due BLE weakness.  Upon sitting up patient c/o severe nausea that continued throughout visit and limited to a few side steps at bedside.  Patient will benefit from continued physical therapy in hospital and recommended venue below to increase strength, balance, endurance for safe ADLs and gait.    Follow Up Recommendations Home health PT;Supervision/Assistance - 24 hour    Equipment Recommendations  Rolling walker with 5" wheels    Recommendations  for Other Services       Precautions / Restrictions Precautions Precautions: Fall Restrictions Weight Bearing Restrictions: No      Mobility  Bed Mobility Overal bed mobility: Needs Assistance Bed Mobility: Supine to Sit;Sit to Supine     Supine to sit: Min assist Sit to supine: Min assist   General bed mobility comments: slow labored movement  Transfers Overall transfer level: Needs assistance Equipment used: 1 person hand held assist;None Transfers: Sit to/from Stand Sit to Stand: Min assist            Ambulation/Gait Ambulation/Gait assistance: Mod assist Ambulation Distance (Feet): 3 Feet Assistive device: None;1 person hand held assist     Gait velocity interpretation: Below normal speed for age/gender General Gait Details: Patient limited to 3-4 side steps secondary to poor balance and c/o nausea  Stairs            Wheelchair Mobility    Modified Rankin (Stroke Patients Only)       Balance Overall balance assessment: Needs assistance Sitting-balance support: Feet supported;No upper extremity supported Sitting balance-Leahy Scale: Good     Standing balance support: No upper extremity supported;During functional activity Standing balance-Leahy Scale: Poor                               Pertinent Vitals/Pain Pain Assessment: Faces Faces Pain Scale: Hurts a little bit Pain Location: stomach Pain Descriptors / Indicators: Aching Pain Intervention(s): Limited activity within patient's tolerance;Monitored during session    Home Living Family/patient  expects to be discharged to:: Private residence Living Arrangements: Children Available Help at Discharge: Family Type of Home: House Home Access: Stairs to enter Entrance Stairs-Rails: Right;Left;Can reach both Secretary/administratorntrance Stairs-Number of Steps: 3 Home Layout: One level Home Equipment: None      Prior Function Level of Independence: Independent               Hand  Dominance        Extremity/Trunk Assessment   Upper Extremity Assessment Upper Extremity Assessment: Generalized weakness    Lower Extremity Assessment Lower Extremity Assessment: Generalized weakness    Cervical / Trunk Assessment Cervical / Trunk Assessment: Normal  Communication   Communication: No difficulties(slow to answer questions)  Cognition Arousal/Alertness: Awake/alert Behavior During Therapy: WFL for tasks assessed/performed Overall Cognitive Status: Within Functional Limits for tasks assessed                                        General Comments      Exercises     Assessment/Plan    PT Assessment Patient needs continued PT services  PT Problem List Decreased strength;Decreased activity tolerance;Decreased balance;Decreased mobility       PT Treatment Interventions Gait training;Functional mobility training;Therapeutic activities;Stair training;Therapeutic exercise;Patient/family education    PT Goals (Current goals can be found in the Care Plan section)  Acute Rehab PT Goals Patient Stated Goal: return home with family to assist PT Goal Formulation: With patient/family Time For Goal Achievement: 10/28/17 Potential to Achieve Goals: Good    Frequency Min 3X/week   Barriers to discharge        Co-evaluation               AM-PAC PT "6 Clicks" Daily Activity  Outcome Measure Difficulty turning over in bed (including adjusting bedclothes, sheets and blankets)?: A Little Difficulty moving from lying on back to sitting on the side of the bed? : A Lot Difficulty sitting down on and standing up from a chair with arms (e.g., wheelchair, bedside commode, etc,.)?: A Lot Help needed moving to and from a bed to chair (including a wheelchair)?: A Lot Help needed walking in hospital room?: A Lot Help needed climbing 3-5 steps with a railing? : A Lot 6 Click Score: 13    End of Session   Activity Tolerance: Patient limited by  fatigue;Other (comment)(Patient limited secondaryt to nausea) Patient left: in bed;with bed alarm set;with call bell/phone within reach;with family/visitor present Nurse Communication: Mobility status PT Visit Diagnosis: Unsteadiness on feet (R26.81);Other abnormalities of gait and mobility (R26.89);Muscle weakness (generalized) (M62.81)    Time: [id number] PT Time Calculation (min) (ACUTE ONLY): 22 min   Charges:   PT Evaluation $PT Eval Moderate Complexity: 1 Mod PT Treatments $Therapeutic Activity: 8-22 mins   PT G Codes:        3:44 PM, 10/14/17 Ocie BobJames Kamaiya Antilla, MPT Physical Therapist [facility] [phone] office [phone] mobile phone

## 2017-10-14 NOTE — ED Provider Notes (Signed)
[facility] EMERGENCY DEPARTMENT Provider Note   CSN: [id number] Arrival date & time: 10/14/17  0541     History   Chief Complaint Chief Complaint  Patient presents with  . Altered Mental Status    HPI Amanda [name] is a 52 y.o. female.  The history is provided by a relative. The history is limited by the condition of the patient (Altered mental status).  She has history of hypertension, diabetes, fibromyalgia and is brought in by family with one-week history of progressive memory loss.  She had been complaining of some numbness on the left side of her face as well.  She had been taken to emergency department twice in the past week with these complaints, but she was also having difficulty with nausea and vomiting.  She been given IV fluids, but family is not aware of any testing directed to her memory loss.  She has had some chills but no fever or sweats.  There has been no cough.  There is not been any change in her medications.  She is not complaining of pain anywhere.  Her daughter states her memory loss is gotten to the point where the patient did not even recognize her.  Of note, patient has been responsible for checking her own blood glucose levels.  Past Medical History:  Diagnosis Date  . Arthritis    knees, shoulder, upper back  . Asthma   . Diabetes mellitus without complication (HCC)   . Environmental allergies   . Fibromyalgia   . GERD (gastroesophageal reflux disease)   . Headache    migraines - 5x/mo  . Hypertension   . Migraines   . Motion sickness    ships  . Sleep apnea   . Thyroid nodule    bilateral and goiter  . Wears contact lenses   . Wears dentures    partial upper    Patient Active Problem List   Diagnosis Date Noted  . Pneumonia 06/03/2017  . Symptomatic cholelithiasis 04/02/2017  . Recurrent incisional hernia 03/17/2017  . Calculus of gallbladder without cholecystitis without obstruction 03/17/2017  . Helicobacter pylori gastritis  03/17/2017  . Community acquired pneumonia 09/28/2016  . Depression 09/28/2016  . Diabetes mellitus, type 2 (HCC) 09/28/2016  . Nausea and vomiting 09/28/2016  . Acute respiratory failure with hypoxemia (HCC) 03/18/2016  . Acute right ankle pain 10/04/2014  . Esophageal reflux 06/22/2012    Past Surgical History:  Procedure Laterality Date  . ABDOMINAL HYSTERECTOMY  2000's  . ABDOMINAL SURGERY     laparoscopy x4 with lysis of adhesions  . APPENDECTOMY  1986  . CESAREAN SECTION  1992  . CHOLECYSTECTOMY N/A 04/03/2017   Procedure: LAPAROSCOPIC CHOLECYSTECTOMY;  Surgeon: Henrene Dodge, MD;  Location: [facility] ORS;  Service: General;  Laterality: N/A;  . COLONOSCOPY WITH PROPOFOL N/A 03/10/2017   Procedure: COLONOSCOPY WITH PROPOFOL;  Surgeon: Scot Jun, MD;  Location: [facility] ENDOSCOPY;  Service: Endoscopy;  Laterality: N/A;  . ECTOPIC PREGNANCY SURGERY  2000's  . ESOPHAGOGASTRODUODENOSCOPY (EGD) WITH PROPOFOL N/A 03/10/2017   Procedure: ESOPHAGOGASTRODUODENOSCOPY (EGD) WITH PROPOFOL;  Surgeon: Scot Jun, MD;  Location: [facility] ENDOSCOPY;  Service: Endoscopy;  Laterality: N/A;  . HERNIA REPAIR    . JOINT REPLACEMENT Right 12/16/12   knee- medial - makoplasty  . KNEE ARTHROSCOPY Right 2006   partial medial and lateral meniscectomies  . KNEE ARTHROSCOPY Right 10/06/2015   Procedure: RIGHT KNEE ARTHROSCOPY WITH DEBRIDEMENT;  Surgeon: Erin Sons, MD;  Location: [facility];  Service: Orthopedics;  Laterality: Right;  Diabetic - insulin and oral meds  . ROTATOR CUFF REPAIR Left 2006  . TUBAL LIGATION  2000's    OB History    Gravida Para Term Preterm AB Living   7             SAB TAB Ectopic Multiple Live Births                   Home Medications    Prior to Admission medications   Medication Sig Start Date End Date Taking? Authorizing Provider  albuterol (PROVENTIL HFA) 108 (90 Base) MCG/ACT inhaler Inhale 2 puffs into the lungs every 4 (four) hours as needed for  wheezing or shortness of breath. 04/16/17   Shane Crutch, MD  Ascorbic Acid (VITAMIN C) 1000 MG tablet Take 1,000 mg by mouth daily.    [provider]  BIOTIN PO Take 1 tablet by mouth daily.     [provider]  BREO ELLIPTA 200-25 MCG/INH AEPB Inhale 1 puff into the lungs daily. 09/26/17   Shane Crutch, MD  canagliflozin (INVOKANA) 300 MG TABS tablet Take 300 mg by mouth daily before breakfast.    [provider]  dexmethylphenidate (FOCALIN) 10 MG tablet Take 10 mg by mouth 2 (two) times daily. 08/16/16   [provider]  diltiazem (DILACOR XR) 240 MG 24 hr capsule Take 240 mg by mouth daily.    [provider]  DULoxetine (CYMBALTA) 60 MG capsule Take 60 mg by mouth daily. AM    [provider]  flunisolide (NASALIDE) 25 MCG/ACT (0.025%) SOLN Place 2 sprays into the nose daily as needed.    [provider]  furosemide (LASIX) 20 MG tablet Take 40 mg by mouth daily.     [provider]  insulin detemir (LEVEMIR) 100 UNIT/ML injection Inject 10 Units into the skin at bedtime.    [provider]  ipratropium-albuterol (DUONEB) 0.5-2.5 (3) MG/3ML SOLN Take 3 mLs by nebulization every 4 (four) hours as needed.    [provider]  isosorbide mononitrate (IMDUR) 30 MG 24 hr tablet Take 0.5 tablets (15 mg total) by mouth daily. Patient taking differently: Take 30 mg by mouth daily.  06/06/17   Katha Hamming, MD  LINZESS 290 MCG CAPS capsule Take 1 tablet by mouth daily. 02/28/17   [provider]  losartan (COZAAR) 25 MG tablet Take 25 mg by mouth daily.    [provider]  metFORMIN (GLUCOPHAGE) 1000 MG tablet Take 1,000 mg by mouth 2 (two) times daily with a meal.     [provider]  metoprolol succinate (TOPROL-XL) 50 MG 24 hr tablet Take 50 mg by mouth daily. Take with or immediately following a meal.    [provider]  montelukast (SINGULAIR) 10 MG  tablet Take 1 tablet (10 mg total) by mouth daily. 06/30/17 06/30/18  Shane Crutch, MD  Multiple Vitamin (MULTIVITAMIN) tablet Take 1 tablet by mouth daily.    [provider]  omeprazole (PRILOSEC) 40 MG capsule Take 1 capsule (40 mg total) by mouth daily. 1 hr before supper 04/05/17   Piscoya, Elita Quick, MD  ondansetron (ZOFRAN) 4 MG tablet Take 1 tablet (4 mg total) by mouth every 8 (eight) hours as needed for nausea or vomiting. 10/07/17   Don Perking, [place], MD  polyethylene glycol [facility] / Ethelene Hal) packet Take 17 g by mouth daily. Mix one tablespoon with 8oz of your favorite juice or water every day until you  are having soft formed stools. Then start taking once daily if you didn't have a stool the day before. 04/17/17   Willy Eddyobinson, Patrick, MD  pravastatin (PRAVACHOL) 40 MG tablet Take 40 mg by mouth daily.    [provider]  predniSONE (DELTASONE) 20 MG tablet Take 3 tablets (60 mg total) by mouth daily. Patient not taking: Reported on 10/07/2017 06/10/17   Jeanmarie PlantMcShane, James A, MD  promethazine (PHENERGAN) 25 MG tablet Take 1 tablet (25 mg total) by mouth every 6 (six) hours as needed for nausea or vomiting. 10/10/17   Jene EveryKinner, Robert, MD  protein supplement shake (PREMIER PROTEIN) LIQD Take 325 mLs (11 oz total) by mouth 3 (three) times daily between meals. 04/05/17   Piscoya, Elita QuickJose, MD  sucralfate (CARAFATE) 1 g tablet Take 1 g by mouth 2 (two) times daily.     [provider]  SUMAtriptan (IMITREX) 25 MG tablet Take 25 mg by mouth every 2 (two) hours as needed for migraine. May repeat in 2 hours if headache persists or recurs.    [provider]  tiotropium (SPIRIVA) 18 MCG inhalation capsule Place 1 capsule (18 mcg total) into inhaler and inhale daily. Patient not taking: Reported on 10/07/2017 06/16/17   Shane Crutchamachandran, Pradeep, MD  tiZANidine (ZANAFLEX) 4 MG tablet Take 4 mg by mouth every 6 (six) hours as needed for muscle spasms.    [provider]    zolpidem (AMBIEN) 5 MG tablet Take 5 mg by mouth at bedtime.     [provider]    Family History Family History  Problem Relation Age of Onset  . Hypertension Mother   . Hypertension Father   . Diabetes Father   . Allergies Father     Social History Social History   Tobacco Use  . Smoking status: Never Smoker  . Smokeless tobacco: Never Used  Substance Use Topics  . Alcohol use: No  . Drug use: No     Allergies   Penicillins and Ace inhibitors   Review of Systems Review of Systems  Unable to perform ROS: Mental status change     Physical Exam Updated Vital Signs BP (!) 154/94 (BP Location: Left Arm)   Pulse 87   Temp 98.2 F (36.8 C) (Oral)   Resp 13   Ht 5\' 5"  (1.651 m)   Wt 91.2 kg (201 lb)   SpO2 100%   BMI 33.45 kg/m   Physical Exam  Nursing note and vitals reviewed.  52 year old female, resting comfortably and in no acute distress. Vital signs are significant for mildly elevated blood pressure. Oxygen saturation is 100%, which is normal. Head is normocephalic and atraumatic. PERRLA, EOMI. Oropharynx is clear. Neck is nontender and supple without adenopathy or JVD.  There are no carotid bruits. Back is nontender and there is no CVA tenderness. Lungs are clear without rales, wheezes, or rhonchi. Chest is nontender. Heart has regular rate and rhythm without murmur. Abdomen is soft, flat, nontender without masses or hepatosplenomegaly and peristalsis is normoactive. Extremities have no cyanosis or edema, full range of motion is present. Skin is warm and dry without rash. Neurologic: She is awake and alert but not oriented to person or place or time (curiously, she does remember that she has been in the hospital several times in the last week), cranial nerves are intact.  She is very poorly cooperative with motor and sensory exam and she appears to be diffusely weak with no definite focal weakness.  ED Treatments /  Results  Labs (all labs  ordered are listed, but only abnormal results are displayed) Labs Reviewed  COMPREHENSIVE METABOLIC PANEL - Abnormal; Notable for the following components:      Result Value   Potassium 3.4 (*)    Glucose, Bld 142 (*)    Total Protein 8.4 (*)    AST 47 (*)    Total Bilirubin 0.1 (*)    All other components within normal limits  URINALYSIS, ROUTINE W REFLEX MICROSCOPIC - Abnormal; Notable for the following components:   Hgb urine dipstick LARGE (*)    Squamous Epithelial / LPF 0-5 (*)    All other components within normal limits  CBG MONITORING, ED - Abnormal; Notable for the following components:   Glucose-Capillary 133 (*)    All other components within normal limits  CBC  AMMONIA  RAPID URINE DRUG SCREEN, HOSP PERFORMED  TSH  HIV ANTIBODY (ROUTINE TESTING)  RPR  VITAMIN B12    EKG  EKG Interpretation  Date/Time:  Tuesday October 14 2017 05:48:52 EST Ventricular Rate:  79 PR Interval:    QRS Duration: 83 QT Interval:  383 QTC Calculation: 439 R Axis:   39 Text Interpretation:  Sinus rhythm Low voltage, extremity leads When compared with ECG of 10/10/2017, Low voltage QRS is now present Confirmed by Dione Booze ([id number]) on 10/14/2017 6:48:28 AM       Radiology Dg Chest 1 View  Result Date: 10/14/2017 CLINICAL DATA:  Chest pain and shortness of breath. History of asthma, hypertension, and diabetes. Nonsmoker. EXAM: CHEST 1 VIEW COMPARISON:  Chest x-ray of June 10, 2017 FINDINGS: The lungs are adequately inflated and clear. The heart is top-normal in size. The pulmonary vascularity is normal. There is no pleural effusion. The trachea is midline. The bony thorax exhibits no acute abnormality. IMPRESSION: There is no active cardiopulmonary disease. Electronically Signed   By: Ladiamond Gallina  [place] M.D.   On: 10/14/2017 07:26   Ct Head Wo Contrast  Result Date: 10/14/2017 CLINICAL DATA:  Altered level of consciousness today. EXAM: CT HEAD WITHOUT CONTRAST TECHNIQUE: Contiguous  axial images were obtained from the base of the skull through the vertex without intravenous contrast. COMPARISON:  Head CT scan 12/01/2015. FINDINGS: Brain: No evidence of acute infarction, hemorrhage, hydrocephalus, extra-axial collection or mass lesion/mass effect. Vascular: No hyperdense vessel or unexpected calcification. Skull: Intact. Sinuses/Orbits: Negative. Other: None. IMPRESSION: Negative head CT. Electronically Signed   By: Drusilla Kanner M.D.   On: 10/14/2017 07:40    Procedures Procedures (including critical care time)  Medications Ordered in ED Medications - No data to display   Initial Impression / Assessment and Plan / ED Course  I have reviewed the triage vital signs and the nursing notes.  Pertinent labs & imaging results that were available during my care of the patient were reviewed by me and considered in my medical decision making (see chart for details).  Altered mental status of uncertain cause.  Old records are reviewed confirming to recent ED visits, but those notes refer to nausea and vomiting and acute kidney injury which resolved after IV fluids.  No mention of any alteration in mental status.  Will check screening labs and CT of head.  ED workup is unremarkable.  CT of head shows no acute process.  Labs show hematuria and trivial elevation of AST which is not clinically significant.  Unfortunately, patient has numerous body piercings which cannot be removed and we will not allow her to have MRI scan.  Case  is discussed with Dr. Kerry Hough of [facility] who agrees to admit the patient under observation status.  Final Clinical Impressions(s) / ED Diagnoses   Final diagnoses:  Altered mental status, unspecified altered mental status type    ED Discharge Orders    None       Dione Booze, MD 10/14/17 [phone]

## 2017-10-14 NOTE — Consult Note (Signed)
Amanda Lake A. Merlene Laughter, MD     [email or web address]          Amanda [name] is an 52 y.o. female.   ASSESSMENT/PLAN: 1. Acute/subacute encephalopathy with left-sided numbness of unclear etiology: Imaging with CT failed to show potential etiology. It is possible she could have a small infarct not seen on CT although I would not expect small infarct to cause confusion without showing up on CT. She does appear to have a viral illness with the nausea vomiting and diarrhea. EEG has not showed changes worrisome for seizures. Spinal tap will be recommended for additional workup. Continue with repeat neurological assessments.     The patient is a 52 year old black female who presents with the acute/subacute onset of confusion mostly with the patient having difficulty with word finding and being quite sleepy. She also has had headaches although she has a baseline history of migraine episodic headaches. She reports havingpain involving the left side. She also complains of significant nausea, vomiting and diarrhea. She complains of feeling cold as reported to [place] by her daughter. She also reports abdominal discomfort and pain. She reports feeling fatigued and weak all over. Symptoms have impression for about a week. She does not report having other symptoms such as loss of consciousness, shaking or convulsions. The review systems is otherwise normal.   GENERAL: She appears to be somewhat weak but in no acute distress.  HEENT: Neck is supple.  ABDOMEN: soft  EXTREMITIES: No edema   BACK: Normal  SKIN: Normal by inspection.    MENTAL STATUS: Alert and oriented. Speech, language and cognition are generally intact. Judgment and insight normal.   CRANIAL NERVES: Pupils are equal, round and reactive to light and accomodation; extra ocular movements are full, there is no significant nystagmus; visual fields are full; upper and lower facial muscles are normal in strength and symmetric,  there is no flattening of the nasolabial folds; tongue is midline; uvula is midline; shoulder elevation is normal.  MOTOR: Left upper and left lower extremities graded as 4/5. Right upper extremity 4+/5 and right leg 5. Bulk and tone are normal throughout.  COORDINATION: Left finger to nose is normal, right finger to nose is normal, No rest tremor; no intention tremor; no postural tremor; no bradykinesia.  REFLEXES: Deep tendon reflexes are symmetrical and normal. Plantar reflexes are flexor bilaterally.   SENSATION: No clear deficit to temperature light touch.        Blood pressure (!) 160/93, pulse (!) 106, temperature 98.6 F (37 C), resp. rate 17, height 5' 5"  (1.651 m), weight 201 lb (91.2 kg), SpO2 100 %.  Past Medical History:  Diagnosis Date  . Arthritis    knees, shoulder, upper back  . Asthma   . Diabetes mellitus without complication ([place])   . Environmental allergies   . Fibromyalgia   . GERD (gastroesophageal reflux disease)   . Headache    migraines - 5x/mo  . Hypertension   . Migraines   . Motion sickness    ships  . Sleep apnea   . Thyroid nodule    bilateral and goiter  . Wears contact lenses   . Wears dentures    partial upper    Past Surgical History:  Procedure Laterality Date  . ABDOMINAL HYSTERECTOMY  2000's  . ABDOMINAL SURGERY     laparoscopy x4 with lysis of adhesions  . APPENDECTOMY  1986  . CESAREAN SECTION  1992  . CHOLECYSTECTOMY N/A 04/03/2017   Procedure: LAPAROSCOPIC  CHOLECYSTECTOMY;  Surgeon: Olean Ree, MD;  Location: [facility] ORS;  Service: General;  Laterality: N/A;  . COLONOSCOPY WITH PROPOFOL N/A 03/10/2017   Procedure: COLONOSCOPY WITH PROPOFOL;  Surgeon: Manya Silvas, MD;  Location: [facility] ENDOSCOPY;  Service: Endoscopy;  Laterality: N/A;  . ECTOPIC PREGNANCY SURGERY  2000's  . ESOPHAGOGASTRODUODENOSCOPY (EGD) WITH PROPOFOL N/A 03/10/2017   Procedure: ESOPHAGOGASTRODUODENOSCOPY (EGD) WITH PROPOFOL;  Surgeon: Manya Silvas,  MD;  Location: [facility] ENDOSCOPY;  Service: Endoscopy;  Laterality: N/A;  . HERNIA REPAIR    . JOINT REPLACEMENT Right 12/16/12   knee- medial - makoplasty  . KNEE ARTHROSCOPY Right 2006   partial medial and lateral meniscectomies  . KNEE ARTHROSCOPY Right 10/06/2015   Procedure: RIGHT KNEE ARTHROSCOPY WITH DEBRIDEMENT;  Surgeon: Leanor Kail, MD;  Location: [facility];  Service: Orthopedics;  Laterality: Right;  Diabetic - insulin and oral meds  . ROTATOR CUFF REPAIR Left 2006  . TUBAL LIGATION  2000's    Family History  Problem Relation Age of Onset  . Hypertension Mother   . Hypertension Father   . Diabetes Father   . Allergies Father     Social History:  reports that  has never smoked. she has never used smokeless tobacco. She reports that she does not drink alcohol or use drugs.  Allergies:  Allergies  Allergen Reactions  . Penicillins Anaphylaxis    Has patient had a PCN reaction causing immediate rash, facial/tongue/throat swelling, SOB or lightheadedness with hypotension: Yes Has patient had a PCN reaction causing severe rash involving mucus membranes or skin necrosis: No Has patient had a PCN reaction that required hospitalization No Has patient had a PCN reaction occurring within the last 10 years: No If all of the above answers are "NO", then may proceed with Cephalosporin use.   . Ace Inhibitors Swelling    Medications: Prior to Admission medications   Medication Sig Start Date End Date Taking? Authorizing Provider  albuterol (PROVENTIL HFA) 108 (90 Base) MCG/ACT inhaler Inhale 2 puffs into the lungs every 4 (four) hours as needed for wheezing or shortness of breath. 04/16/17  Yes Laverle Hobby, MD  Ascorbic Acid (VITAMIN C) 1000 MG tablet Take 1,000 mg by mouth daily.   Yes [provider]  BREO ELLIPTA 200-25 MCG/INH AEPB Inhale 1 puff into the lungs daily. 09/26/17  Yes Laverle Hobby, MD  canagliflozin (INVOKANA) 300 MG TABS tablet  Take 300 mg by mouth daily before breakfast.   Yes [provider]  flunisolide (NASALIDE) 25 MCG/ACT (0.025%) SOLN Place 2 sprays into the nose daily as needed.   Yes [provider]  insulin detemir (LEVEMIR) 100 UNIT/ML injection Inject 10 Units into the skin at bedtime.   Yes [provider]  ipratropium-albuterol (DUONEB) 0.5-2.5 (3) MG/3ML SOLN Take 3 mLs by nebulization every 4 (four) hours as needed.   Yes [provider]  isosorbide mononitrate (IMDUR) 30 MG 24 hr tablet Take 0.5 tablets (15 mg total) by mouth daily. Patient taking differently: Take 30 mg by mouth daily.  06/06/17  Yes Epifanio Lesches, MD  LINZESS 290 MCG CAPS capsule Take 1 tablet by mouth daily. 02/28/17  Yes [provider]  metFORMIN (GLUCOPHAGE) 1000 MG tablet Take 1,000 mg by mouth 2 (two) times daily with a meal.    Yes [provider]  metoprolol succinate (TOPROL-XL) 50 MG 24 hr tablet Take 50 mg by mouth daily. Take with or immediately following a meal.   Yes [provider]  montelukast (SINGULAIR) 10 MG tablet Take 1 tablet (10 mg total) by mouth daily. 06/30/17 06/30/18 Yes Laverle Hobby, MD  Multiple Vitamin (MULTIVITAMIN) tablet Take 1 tablet by mouth daily.   Yes [provider]  omeprazole (PRILOSEC) 40 MG capsule Take 1 capsule (40 mg total) by mouth daily. 1 hr before supper 04/05/17  Yes Piscoya, Jose, MD  polyethylene glycol (MIRALAX / GLYCOLAX) packet Take 17 g by mouth daily. Mix one tablespoon with 8oz of your favorite juice or water every day until you are having soft formed stools. Then start taking once daily if you didn't have a stool the day before. 04/17/17  Yes Merlyn Lot, MD  pravastatin (PRAVACHOL) 40 MG tablet Take 40 mg by mouth daily.   Yes [provider]  promethazine (PHENERGAN) 25 MG tablet Take 1 tablet (25 mg total) by mouth every 6 (six) hours as needed for nausea or vomiting. 10/10/17  Yes  Lavonia Drafts, MD  protein supplement shake (PREMIER PROTEIN) LIQD Take 325 mLs (11 oz total) by mouth 3 (three) times daily between meals. 04/05/17  Yes Piscoya, Jose, MD  sucralfate (CARAFATE) 1 g tablet Take 1 g by mouth 2 (two) times daily.    Yes [provider]  SUMAtriptan (IMITREX) 25 MG tablet Take 25 mg by mouth every 2 (two) hours as needed for migraine. May repeat in 2 hours if headache persists or recurs.   Yes [provider]  tiZANidine (ZANAFLEX) 4 MG tablet Take 4 mg by mouth every 6 (six) hours as needed for muscle spasms.   Yes [provider]  zolpidem (AMBIEN) 5 MG tablet Take 5 mg by mouth at bedtime.    Yes [provider]    Scheduled Meds: . aspirin  300 mg Rectal Daily   Or  . aspirin  325 mg Oral Daily  . enoxaparin (LOVENOX) injection  40 mg Subcutaneous Q24H  . insulin aspart  0-15 Units Subcutaneous TID WC  . insulin aspart  0-5 Units Subcutaneous QHS  . pantoprazole (PROTONIX) IV  40 mg Intravenous Q24H   Continuous Infusions: . 0.9 % NaCl with KCl 20 mEq / L 75 mL/hr at 10/14/17 1127   PRN Meds:.acetaminophen **OR** acetaminophen (TYLENOL) oral liquid 160 mg/5 mL **OR** acetaminophen, hydrALAZINE, ondansetron (ZOFRAN) IV, senna-docusate     Results for orders placed or performed during the hospital encounter of 10/14/17 (from the past 48 hour(s))  Comprehensive metabolic panel     Status: Abnormal   Collection Time: 10/14/17  5:45 AM  Result Value Ref Range   Sodium 145 135 - 145 mmol/L   Potassium 3.4 (L) 3.5 - 5.1 mmol/L   Chloride 110 101 - 111 mmol/L   CO2 22 22 - 32 mmol/L   Glucose, Bld 142 (H) 65 - 99 mg/dL   BUN 18 6 - 20 mg/dL   Creatinine, Ser 0.99 0.44 - 1.00 mg/dL   Calcium 9.9 8.9 - 10.3 mg/dL   Total Protein 8.4 (H) 6.5 - 8.1 g/dL   Albumin 4.5 3.5 - 5.0 g/dL   AST 47 (H) 15 - 41 U/L   ALT 51 14 - 54 U/L   Alkaline Phosphatase 125 38 - 126 U/L   Total Bilirubin 0.1 (L) 0.3 - 1.2 mg/dL   GFR  calc non Af Amer >60 >60 mL/min   GFR calc Af Amer >60 >60 mL/min    Comment: (NOTE) The eGFR has been calculated using the CKD EPI equation. This calculation has not been  validated in all clinical situations. eGFR's persistently <60 mL/min signify possible Chronic Kidney Disease.    Anion gap 13 5 - 15    Comment: Performed at [facility], [address], [place], [place] [address]  CBC     Status: None   Collection Time: 10/14/17  5:45 AM  Result Value Ref Range   WBC 5.6 4.0 - 10.5 K/uL   RBC 4.91 3.87 - 5.11 MIL/uL   Hemoglobin 14.7 12.0 - 15.0 g/dL   HCT 44.5 36.0 - 46.0 %   MCV 90.6 78.0 - 100.0 fL   MCH 29.9 26.0 - 34.0 pg   MCHC 33.0 30.0 - 36.0 g/dL   RDW 14.1 11.5 - 15.5 %   Platelets 278 150 - 400 K/uL    Comment: Performed at [facility], [address], [place], [place] [address]  Ammonia     Status: None   Collection Time: 10/14/17  5:45 AM  Result Value Ref Range   Ammonia 20 9 - 35 umol/L    Comment: Performed at [facility], [address], [place], [place] [address]  Hemoglobin A1c     Status: Abnormal   Collection Time: 10/14/17  5:49 AM  Result Value Ref Range   Hgb A1c MFr Bld 7.6 (H) 4.8 - 5.6 %    Comment: (NOTE) Pre diabetes:          5.7%-6.4% Diabetes:              >6.4% Glycemic control for   <7.0% adults with diabetes    Mean Plasma Glucose 171.42 mg/dL    Comment: Performed at [facility], [place] [address] [place], [place] [address]  Lipid panel     Status: None   Collection Time: 10/14/17  5:49 AM  Result Value Ref Range   Cholesterol 162 0 - 200 mg/dL   Triglycerides 86 <150 mg/dL   HDL 73 >40 mg/dL   Total CHOL/HDL Ratio 2.2 RATIO   VLDL 17 0 - 40 mg/dL   LDL Cholesterol 72 0 - 99 mg/dL    Comment:        Total Cholesterol/HDL:CHD Risk Coronary Heart Disease Risk Table                     Men   Women  1/2 Average Risk   3.4   3.3  Average Risk       5.0   4.4  2 X Average Risk   9.6   7.1  3 X Average Risk  23.4   11.0         Use the calculated Patient Ratio above and the CHD Risk Table to determine the patient's CHD Risk.        ATP III CLASSIFICATION (LDL):  <100     mg/dL   Optimal  100-129  mg/dL   Near or Above                    Optimal  130-159  mg/dL   Borderline  160-189  mg/dL   High  >190     mg/dL   Very High Performed at [facility], [address], [place], [place] [address]   CBG monitoring, ED     Status: Abnormal   Collection Time: 10/14/17  5:53 AM  Result Value Ref Range   Glucose-Capillary 133 (H) 65 - 99 mg/dL  Urinalysis, Routine w reflex microscopic     Status: Abnormal  Collection Time: 10/14/17  6:29 AM  Result Value Ref Range   Color, Urine YELLOW YELLOW   APPearance CLEAR CLEAR   Specific Gravity, Urine 1.018 1.005 - 1.030   pH 7.0 5.0 - 8.0   Glucose, UA NEGATIVE NEGATIVE mg/dL   Hgb urine dipstick LARGE (A) NEGATIVE   Bilirubin Urine NEGATIVE NEGATIVE   Ketones, ur NEGATIVE NEGATIVE mg/dL   Protein, ur NEGATIVE NEGATIVE mg/dL   Nitrite NEGATIVE NEGATIVE   Leukocytes, UA NEGATIVE NEGATIVE   RBC / HPF TOO NUMEROUS TO COUNT 0 - 5 RBC/hpf   WBC, UA 0-5 0 - 5 WBC/hpf   Bacteria, UA NONE SEEN NONE SEEN   Squamous Epithelial / LPF 0-5 (A) NONE SEEN    Comment: Performed at [facility], [address] [place], [place] [address]  Urine rapid drug screen (hosp performed)     Status: Abnormal   Collection Time: 10/14/17  7:37 AM  Result Value Ref Range   Opiates NONE DETECTED NONE DETECTED   Cocaine NONE DETECTED NONE DETECTED   Benzodiazepines NONE DETECTED NONE DETECTED   Amphetamines NONE DETECTED NONE DETECTED   Tetrahydrocannabinol NONE DETECTED NONE DETECTED   Barbiturates POSITIVE (A) NONE DETECTED    Comment: (NOTE) DRUG SCREEN FOR MEDICAL PURPOSES ONLY.  IF CONFIRMATION IS NEEDED FOR ANY PURPOSE, NOTIFY LAB WITHIN 5 DAYS. LOWEST DETECTABLE LIMITS FOR URINE DRUG SCREEN Drug Class                     Cutoff (ng/mL) Amphetamine and metabolites     1000 Barbiturate and metabolites    200 Benzodiazepine                 100 Tricyclics and metabolites     300 Opiates and metabolites        300 Cocaine and metabolites        300 THC                            50 Performed at [facility], [address], [place], [place] [address]   TSH     Status: None   Collection Time: 10/14/17  8:48 AM  Result Value Ref Range   TSH 1.442 0.350 - 4.500 uIU/mL    Comment: Performed by a 3rd Generation assay with a functional sensitivity of <=0.01 uIU/mL. Performed at [facility], [address] [place], [place] [address]   Vitamin B12     Status: None   Collection Time: 10/14/17  8:49 AM  Result Value Ref Range   Vitamin B-12 463 180 - 914 pg/mL    Comment: (NOTE) This assay is not validated for testing neonatal or myeloproliferative syndrome specimens for Vitamin B12 levels. Performed at [facility], [place] [address], [place], [place] [address]   Glucose, capillary     Status: Abnormal   Collection Time: 10/14/17 12:20 PM  Result Value Ref Range   Glucose-Capillary 126 (H) 65 - 99 mg/dL   Comment 1 Document in Chart   Glucose, capillary     Status: Abnormal   Collection Time: 10/14/17  4:54 PM  Result Value Ref Range   Glucose-Capillary 143 (H) 65 - 99 mg/dL   Comment 1 Document in Chart     Studies/Results:   EEG  Findings:  There is a posterior dominant rhythm of 7 Hz which is symmetrical.  No focal slowing is present.  Intermittently, there is generalized  delta frequency slowing for a few seconds while awake.  There are no epileptiform discharges or electrographic seizures.  Hyperventilation and photic stimulation are not performed.  Impression:  This is an abnormal EEG.  There is evidence of mild generalized slowing of brain activity which intermittently becomes severe in nature for short bursts of time.  This is non-specific, but may be due to a toxic, metabolic, or infectious encephalopathy.  Clinical correlation is  recommended.  The patient is not in non-convulsive status epilepticus.       HEAD NECK CTA FINDINGS: CTA NECK FINDINGS  Aortic arch: There is no calcific atherosclerosis of the aortic arch. There is no aneurysm, dissection or hemodynamically significant stenosis of the visualized ascending aorta and aortic arch. Normal variant aortic arch branching pattern with the brachiocephalic and left common carotid arteries sharing a common origin. The visualized proximal subclavian arteries are normal.  Right carotid system: The right common carotid origin is widely patent. There is no common carotid or internal carotid artery dissection or aneurysm. No hemodynamically significant stenosis.  Left carotid system: The left common carotid origin is widely patent. There is no common carotid or internal carotid artery dissection or aneurysm. No hemodynamically significant stenosis.  Vertebral arteries: The vertebral system is left dominant. Both vertebral artery origins are normal. Both vertebral arteries are normal to their confluence with the basilar artery.  Skeleton: There is no bony spinal canal stenosis. No lytic or blastic lesions. Mild cervical degenerative disc disease.  Other neck: The nasopharynx is clear. The oropharynx and hypopharynx are normal. The epiglottis is normal. The supraglottic larynx, glottis and subglottic larynx are normal. No retropharyngeal collection. The parapharyngeal spaces are preserved. The parotid and submandibular glands are normal. No sialolithiasis or salivary ductal dilatation. The thyroid gland is normal. There is no cervical lymphadenopathy.  Upper chest: No pneumothorax or pleural effusion. No nodules or masses.  Review of the MIP images confirms the above findings  CTA HEAD FINDINGS  Anterior circulation:  --Intracranial internal carotid arteries: Normal.  --Anterior cerebral arteries: Normal.  --Middle cerebral arteries:  Normal.  --Posterior communicating arteries: Present bilaterally.  Posterior circulation:  --Posterior cerebral arteries: Normal.  --Superior cerebellar arteries: Normal.  --Basilar artery: Normal.  --Anterior inferior cerebellar arteries: Normal.  --Posterior inferior cerebellar arteries: Normal.  Venous sinuses: As permitted by contrast timing, patent.  Anatomic variants: None  Delayed phase: No parenchymal contrast enhancement.  Review of the MIP images confirms the above findings.  IMPRESSION: Normal CTA of the head and neck.       CT scan is reviewed in person and shows evidence of mild white matter disease bilaterally but nothing acute is appreciated.      Amanda [name], M.D.  Diplomate, Tax adviser of Psychiatry and Neurology ( Neurology). 10/14/2017, 6:56 PM

## 2017-10-14 NOTE — ED Triage Notes (Signed)
Pt states she has been altered x one week. Pt cannot tell me where she is hurting at this time.

## 2017-10-14 NOTE — ED Notes (Signed)
Pt has several transdermal piercing's.  Unable to do MRI.  Dr. Preston FleetingGlick notified.

## 2017-10-14 NOTE — Progress Notes (Signed)
Could not complete 1600 neuro checks because patient was having an EEG. Completed as soon as possible.

## 2017-10-14 NOTE — Care Management Obs Status (Signed)
MEDICARE OBSERVATION STATUS NOTIFICATION   Patient Details  Name: Amanda [name] MRN: [id number] Date of Birth: 04-Oct-1965   Medicare Observation Status Notification Given:  Yes(daughter at bedside )    Aldan Camey, Chrystine OilerSharley Diane, RN 10/14/2017, 2:13 PM

## 2017-10-14 NOTE — Evaluation (Signed)
Speech Language Pathology Evaluation Patient Details Name: Amanda [name] MRN: [id number] DOB: Mar 18, 1966 Today's Date: 10/14/2017 Time: [id number] SLP Time Calculation (min) (ACUTE ONLY): 48 min  Problem List:  Patient Active Problem List   Diagnosis Date Noted  . Acute encephalopathy 10/14/2017  . Hypokalemia 10/14/2017  . Elevated blood pressure reading 10/14/2017  . Pneumonia 06/03/2017  . Symptomatic cholelithiasis 04/02/2017  . Recurrent incisional hernia 03/17/2017  . Calculus of gallbladder without cholecystitis without obstruction 03/17/2017  . Helicobacter pylori gastritis 03/17/2017  . Community acquired pneumonia 09/28/2016  . Depression 09/28/2016  . Diabetes mellitus, type 2 (HCC) 09/28/2016  . Nausea and vomiting 09/28/2016  . Acute respiratory failure with hypoxemia (HCC) 03/18/2016  . Acute right ankle pain 10/04/2014  . Esophageal reflux 06/22/2012   Past Medical History:  Past Medical History:  Diagnosis Date  . Arthritis    knees, shoulder, upper back  . Asthma   . Diabetes mellitus without complication (HCC)   . Environmental allergies   . Fibromyalgia   . GERD (gastroesophageal reflux disease)   . Headache    migraines - 5x/mo  . Hypertension   . Migraines   . Motion sickness    ships  . Sleep apnea   . Thyroid nodule    bilateral and goiter  . Wears contact lenses   . Wears dentures    partial upper   Past Surgical History:  Past Surgical History:  Procedure Laterality Date  . ABDOMINAL HYSTERECTOMY  2000's  . ABDOMINAL SURGERY     laparoscopy x4 with lysis of adhesions  . APPENDECTOMY  1986  . CESAREAN SECTION  1992  . CHOLECYSTECTOMY N/A 04/03/2017   Procedure: LAPAROSCOPIC CHOLECYSTECTOMY;  Surgeon: Henrene DodgePiscoya, Jose, MD;  Location: [facility] ORS;  Service: General;  Laterality: N/A;  . COLONOSCOPY WITH PROPOFOL N/A 03/10/2017   Procedure: COLONOSCOPY WITH PROPOFOL;  Surgeon: Scot JunElliott, Robert T, MD;  [facility];  Service: Endoscopy;   Laterality: N/A;  . ECTOPIC PREGNANCY SURGERY  2000's  . ESOPHAGOGASTRODUODENOSCOPY (EGD) WITH PROPOFOL N/A 03/10/2017   Procedure: ESOPHAGOGASTRODUODENOSCOPY (EGD) WITH PROPOFOL;  Surgeon: Scot JunElliott, Robert T, MD;  [facility];  Service: Endoscopy;  Laterality: N/A;  . HERNIA REPAIR    . JOINT REPLACEMENT Right 12/16/12   knee- medial - makoplasty  . KNEE ARTHROSCOPY Right 2006   partial medial and lateral meniscectomies  . KNEE ARTHROSCOPY Right 10/06/2015   Procedure: RIGHT KNEE ARTHROSCOPY WITH DEBRIDEMENT;  Surgeon: Erin SonsHarold Kernodle, MD;  [facility];  Service: Orthopedics;  Laterality: Right;  Diabetic - insulin and oral meds  . ROTATOR CUFF REPAIR Left 2006  . TUBAL LIGATION  2000's   HPI:  Amanda [name]  is a 52 y.o. female, with history of diabetes mellitus type 2 on oral hypoglycemics, arthritis, fibromyalgia, GERD, hypertension and history of migraines was brought to the ED by family with progressive decline in mental status for past 4 days.  Patient was [facility] 1 week ago with acute gastroenteritis with symptoms of nausea, vomiting and diarrhea and found to have mild AK I.  She got IV fluids in the ED and was discharged home.  Since then family has found her to be less interactive, sleepy most of the time and confused.  She was then taken to the ED 3 days later with complaints of generalized weakness, nausea and some abdominal pain.  She was again given IV fluids, abdominal x-ray unremarkable and given antiemetics for suspected gastroenteritis.  Since then  she has been increasingly confused and unable to communicate properly, has been confused even telling her name or calling out for her children.  History provided by patient's son and daughter at bedside who informed that patient was complaining of numbness in the left side of her arm and face, complaint of some chills and was still having off-and-on vomiting and loose stools.     Assessment / Plan / Recommendation Clinical Impression  Pt presents with moderate/severe cognitive linguistic deficits characterized by impairments in attention, memory, orientation, problem solving, verbal expression, and auditory comprehension. Suspect her cognitive linguistic skills are negatively impacted by current nausea and lethargy, however her daughter confirms significant confusion for several days. Pt without evidence of aphasia, apraxia, or dysarthria. Pt presents as confused and disoriented. She was inconsistently able to follow 1-step directions and answer personal/bio questions which did not require a specific response other than yes/no. Pt frequently restated the questions without offering an answer when responding to responsive naming questions. She was unable to state her birthday, name her children, or say the months of the year. Pt noted to touch her forehead with her left hand, but was unable to self feed when presented with cracker. Pt reports significant global weakness and numbness in both extremities. 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 She will likely need SLP services at next level of care at this point.    SLP Assessment  SLP Recommendation/Assessment: Patient needs continued Speech Lanaguage Pathology Services SLP Visit Diagnosis: Cognitive communication deficit (R41.841)    Follow Up Recommendations  Skilled Nursing facility(pending)    Frequency and Duration min 2x/week  1 week      SLP Evaluation Cognition  Overall Cognitive Status: Impaired/Different from baseline Arousal/Alertness: Awake/alert Orientation Level: Oriented to person;Disoriented to place;Disoriented to time;Disoriented to situation Attention: Sustained Sustained Attention: Impaired Sustained Attention Impairment: Verbal basic Memory: Impaired Memory Impairment:  Storage deficit;Retrieval deficit Awareness: Impaired Awareness Impairment: Intellectual impairment Problem Solving: Impaired Problem Solving Impairment: Verbal basic Executive Function: Decision Making;Organizing;Sequencing;Reasoning;Self Monitoring Reasoning: Impaired Sequencing: Impaired Organizing: Impaired Organizing Impairment: Verbal basic Decision Making: Impaired Decision Making Impairment: Verbal basic Self Monitoring: Impaired Self Monitoring Impairment: Verbal basic Safety/Judgment: Impaired       Comprehension  Auditory Comprehension Overall Auditory Comprehension: Impaired Yes/No Questions: Within Functional Limits;Impaired Basic Biographical Questions: 51-75% accurate Commands: Impaired One Step Basic Commands: 50-74% accurate Conversation: Simple Interfering Components: Attention;Working memory(nausea) EffectiveTechniques: Repetition;Extra processing time;Visual/Gestural cues;Stressing words Visual Recognition/Discrimination Discrimination: Not tested Reading Comprehension Reading Status: Not tested    Expression Expression Primary Mode of Expression: Verbal Verbal Expression Overall Verbal Expression: Impaired Initiation: No impairment Automatic Speech: Name(unable to say days of week) Level of Generative/Spontaneous Verbalization: Phrase Repetition: Impaired Level of Impairment: Word level Naming: Impairment Responsive: 0-25% accurate Confrontation: Impaired Convergent: 25-49% accurate Divergent: 0-24% accurate Pragmatics: Impairment Impairments: Abnormal affect;Topic maintenance;Monotone Interfering Components: Attention Non-Verbal Means of Communication: Not applicable Written Expression Dominant Hand: Right Written Expression: Not tested   Oral / Motor  Oral Motor/Sensory Function Overall Oral Motor/Sensory Function: Generalized oral weakness Motor Speech Overall Motor Speech: Appears within functional limits for tasks assessed   Thank  you,  Havery Moros, CCC-SLP [phone]                     PORTER,DABNEY 10/14/2017, 6:30 PM

## 2017-10-14 NOTE — Progress Notes (Signed)
*  PRELIMINARY RESULTS* Echocardiogram 2D Echocardiogram has been performed.  Stacey DrainWhite, Raygen Dahm J 10/14/2017, 12:42 PM

## 2017-10-15 ENCOUNTER — Observation Stay (HOSPITAL_COMMUNITY): Payer: 59

## 2017-10-15 ENCOUNTER — Encounter (HOSPITAL_COMMUNITY): Payer: Self-pay | Admitting: Family Medicine

## 2017-10-15 DIAGNOSIS — K219 Gastro-esophageal reflux disease without esophagitis: Secondary | ICD-10-CM

## 2017-10-15 DIAGNOSIS — R197 Diarrhea, unspecified: Secondary | ICD-10-CM | POA: Diagnosis present

## 2017-10-15 DIAGNOSIS — G43A0 Cyclical vomiting, not intractable: Secondary | ICD-10-CM | POA: Diagnosis not present

## 2017-10-15 DIAGNOSIS — R030 Elevated blood-pressure reading, without diagnosis of hypertension: Secondary | ICD-10-CM | POA: Diagnosis not present

## 2017-10-15 DIAGNOSIS — J45909 Unspecified asthma, uncomplicated: Secondary | ICD-10-CM | POA: Diagnosis present

## 2017-10-15 DIAGNOSIS — R000 Tachycardia, unspecified: Secondary | ICD-10-CM | POA: Diagnosis present

## 2017-10-15 DIAGNOSIS — F329 Major depressive disorder, single episode, unspecified: Secondary | ICD-10-CM | POA: Diagnosis present

## 2017-10-15 DIAGNOSIS — R413 Other amnesia: Secondary | ICD-10-CM | POA: Diagnosis present

## 2017-10-15 DIAGNOSIS — E876 Hypokalemia: Secondary | ICD-10-CM

## 2017-10-15 DIAGNOSIS — E1100 Type 2 diabetes mellitus with hyperosmolarity without nonketotic hyperglycemic-hyperosmolar coma (NKHHC): Secondary | ICD-10-CM

## 2017-10-15 DIAGNOSIS — Z794 Long term (current) use of insulin: Secondary | ICD-10-CM | POA: Diagnosis not present

## 2017-10-15 DIAGNOSIS — Z880 Allergy status to penicillin: Secondary | ICD-10-CM | POA: Diagnosis not present

## 2017-10-15 DIAGNOSIS — Z96651 Presence of right artificial knee joint: Secondary | ICD-10-CM | POA: Diagnosis present

## 2017-10-15 DIAGNOSIS — G9340 Encephalopathy, unspecified: Secondary | ICD-10-CM | POA: Diagnosis present

## 2017-10-15 DIAGNOSIS — M19011 Primary osteoarthritis, right shoulder: Secondary | ICD-10-CM | POA: Diagnosis present

## 2017-10-15 DIAGNOSIS — I10 Essential (primary) hypertension: Secondary | ICD-10-CM | POA: Diagnosis present

## 2017-10-15 DIAGNOSIS — M19012 Primary osteoarthritis, left shoulder: Secondary | ICD-10-CM | POA: Diagnosis present

## 2017-10-15 DIAGNOSIS — G43909 Migraine, unspecified, not intractable, without status migrainosus: Secondary | ICD-10-CM | POA: Diagnosis present

## 2017-10-15 DIAGNOSIS — M479 Spondylosis, unspecified: Secondary | ICD-10-CM | POA: Diagnosis present

## 2017-10-15 DIAGNOSIS — R112 Nausea with vomiting, unspecified: Secondary | ICD-10-CM | POA: Diagnosis present

## 2017-10-15 DIAGNOSIS — G4730 Sleep apnea, unspecified: Secondary | ICD-10-CM | POA: Diagnosis present

## 2017-10-15 DIAGNOSIS — Z972 Presence of dental prosthetic device (complete) (partial): Secondary | ICD-10-CM

## 2017-10-15 DIAGNOSIS — M170 Bilateral primary osteoarthritis of knee: Secondary | ICD-10-CM | POA: Diagnosis present

## 2017-10-15 DIAGNOSIS — B349 Viral infection, unspecified: Secondary | ICD-10-CM | POA: Diagnosis present

## 2017-10-15 DIAGNOSIS — Z888 Allergy status to other drugs, medicaments and biological substances status: Secondary | ICD-10-CM | POA: Diagnosis not present

## 2017-10-15 DIAGNOSIS — R29710 NIHSS score 10: Secondary | ICD-10-CM | POA: Diagnosis present

## 2017-10-15 DIAGNOSIS — E119 Type 2 diabetes mellitus without complications: Secondary | ICD-10-CM | POA: Diagnosis present

## 2017-10-15 DIAGNOSIS — M797 Fibromyalgia: Secondary | ICD-10-CM | POA: Diagnosis present

## 2017-10-15 DIAGNOSIS — R4182 Altered mental status, unspecified: Secondary | ICD-10-CM | POA: Diagnosis present

## 2017-10-15 LAB — CSF CELL COUNT WITH DIFFERENTIAL
RBC Count, CSF: 54 /mm3 — ABNORMAL HIGH
Tube #: 4
WBC, CSF: 2 /mm3 (ref 0–5)

## 2017-10-15 LAB — GLUCOSE, CAPILLARY
Glucose-Capillary: 145 mg/dL — ABNORMAL HIGH (ref 65–99)
Glucose-Capillary: 141 mg/dL — ABNORMAL HIGH (ref 65–99)
Glucose-Capillary: 138 mg/dL — ABNORMAL HIGH (ref 65–99)
Glucose-Capillary: 146 mg/dL — ABNORMAL HIGH (ref 65–99)
Glucose-Capillary: 173 mg/dL — ABNORMAL HIGH (ref 65–99)

## 2017-10-15 LAB — PROTEIN, CSF: Total  Protein, CSF: 42 mg/dL (ref 15–45)

## 2017-10-15 LAB — CRYPTOCOCCAL ANTIGEN, CSF: Crypto Ag: NEGATIVE

## 2017-10-15 LAB — GLUCOSE, CSF: Glucose, CSF: 111 mg/dL — ABNORMAL HIGH (ref 40–70)

## 2017-10-15 MED ORDER — SODIUM CHLORIDE 0.9 % IV BOLUS (SEPSIS)
250.0000 mL | Freq: Once | INTRAVENOUS | Status: AC
  Administered 2017-10-15: 250 mL via INTRAVENOUS

## 2017-10-15 MED ORDER — METOPROLOL SUCCINATE ER 50 MG PO TB24
50.0000 mg | ORAL_TABLET | Freq: Every day | ORAL | Status: DC
  Administered 2017-10-15 – 2017-10-17 (×3): 50 mg via ORAL
  Filled 2017-10-15 (×3): qty 1

## 2017-10-15 NOTE — Progress Notes (Signed)
Pt arrived back on floor and started laying flat in bed at 1417. Will let patient slowly start to it up around 1817. Will continue to monitor.

## 2017-10-15 NOTE — Progress Notes (Signed)
Pt called out for Tylenol d/t L leg pain. When RN went in room pt and daughter were asking about the spinal tap pt has scheduled for tomorrow. Rn adv that the doctor should explain the procedure to  Patient. Pts daughter stated pt is having second thoughts and asks if pt doesn't go thru with it if the pt will be sent home. Pts daughter expresses that she thinks it will hurt the pt and she doesn't think the pt will be able to sit still.  RN adv that pt has to sign consent form in order to get procedure done and if pt and family have any questions that need to be answered before procedure we can find the resources to explain. Pt and daughter both state they are unsure of it. Upon entering back into room, RN catches pt moving herself up in bed---upon 2am neuro assessment pt could not "move her arms." Will continue to monitor  pt

## 2017-10-15 NOTE — Progress Notes (Signed)
Physical Therapy Treatment Patient Details Name: YULENI BURICH MRN: [id number] DOB: [birth year] Today's Date: 10/15/2017    History of Present Illness Nathalya Wolanski  is a 52 y.o. female, with history of diabetes mellitus type 2 on oral hypoglycemics, arthritis, fibromyalgia, GERD, hypertension and history of migraines was brought to the ED by family with progressive decline in mental status for past 4 days.  Patient was seen at [facility] 1 week ago with acute gastroenteritis with symptoms of nausea, vomiting and diarrhea and found to have mild AK I.  She got IV fluids in the ED and was discharged home.  Since then family has found her to be less interactive, sleepy most of the time and confused.  She was then taken to the ED 3 days later with complaints of generalized weakness, nausea and some abdominal pain.  She was again given IV fluids, abdominal x-ray unremarkable and given antiemetics for suspected gastroenteritis.  Since then she has been increasingly confused and unable to communicate properly, has been confused even telling her name or calling out for her children.  History provided by patient's son and daughter at bedside who informed that patient was complaining of numbness in the left side of her arm and face, complaint of some chills and was still having off-and-on vomiting and loose stools.    PT Comments    Patient demonstrates increased endurance/distance for gait training, able to ambulate in hallway without loss of balance using RW, but requires occasional standing rest breaks due to fatigue.  Patient tolerated staying up in chair after therapy.  Patient will benefit from continued physical therapy in hospital and recommended venue below to increase strength, balance, endurance for safe ADLs and gait.    Follow Up Recommendations  Home health PT;Supervision/Assistance - 24 hour     Equipment Recommendations  Rolling walker with 5" wheels    Recommendations for  Other Services       Precautions / Restrictions Precautions Precautions: Fall Restrictions Weight Bearing Restrictions: No    Mobility  Bed Mobility               General bed mobility comments: Presents seated in chair  Transfers Overall transfer level: Needs assistance Equipment used: Rolling walker (2 wheeled) Transfers: Sit to/from [employer] Sit to Stand: Min guard Stand pivot transfers: Min guard          Ambulation/Gait Ambulation/Gait assistance: Min guard Ambulation Distance (Feet): 40 Feet Assistive device: Rolling walker (2 wheeled) Gait Pattern/deviations: Decreased step length - right;Decreased step length - left;Decreased stride length   Gait velocity interpretation: Below normal speed for age/gender General Gait Details: demonstrates slow labored cadence without loss of balance, occasional standing rest breaks, limited mostly due to fatigue   Stairs            Wheelchair Mobility    Modified Rankin (Stroke Patients Only)       Balance   Sitting-balance support: No upper extremity supported;Feet supported Sitting balance-Leahy Scale: Good     Standing balance support: Bilateral upper extremity supported;During functional activity Standing balance-Leahy Scale: Fair                              Cognition Arousal/Alertness: Awake/alert Behavior During Therapy: WFL for tasks assessed/performed Overall Cognitive Status: Within Functional Limits for tasks assessed  Exercises General Exercises - Lower Extremity Long Arc Quad: Seated;AROM;Strengthening;Both;10 reps Hip Flexion/Marching: Seated;AROM;Strengthening;Both;10 reps Toe Raises: Seated;AROM;Strengthening;Both;10 reps Heel Raises: Seated;AROM;Strengthening;Both;10 reps    General Comments        Pertinent Vitals/Pain Pain Assessment: No/denies pain    Home Living                       Prior Function            PT Goals (current goals can now be found in the care plan section) Acute Rehab PT Goals Patient Stated Goal: return home with family to assist PT Goal Formulation: With patient/family Time For Goal Achievement: 10/28/17 Potential to Achieve Goals: Good Progress towards PT goals: Progressing toward goals    Frequency    Min 3X/week      PT Plan Current plan remains appropriate    Co-evaluation              AM-PAC PT "6 Clicks" Daily Activity  Outcome Measure  Difficulty turning over in bed (including adjusting bedclothes, sheets and blankets)?: A Little Difficulty moving from lying on back to sitting on the side of the bed? : A Little Difficulty sitting down on and standing up from a chair with arms (e.g., wheelchair, bedside commode, etc,.)?: A Little Help needed moving to and from a bed to chair (including a wheelchair)?: A Little Help needed walking in hospital room?: A Little Help needed climbing 3-5 steps with a railing? : A Lot 6 Click Score: 17    End of Session Equipment Utilized During Treatment: Gait belt Activity Tolerance: Patient tolerated treatment well;Patient limited by fatigue Patient left: in chair;with call bell/phone within reach;with chair alarm set Nurse Communication: Mobility status PT Visit Diagnosis: Unsteadiness on feet (R26.81);Other abnormalities of gait and mobility (R26.89);Muscle weakness (generalized) (M62.81)     Time: [id number] PT Time Calculation (min) (ACUTE ONLY): 25 min  Charges:  $Therapeutic Activity: 23-37 mins                    G Codes:       12:05 PM, 10/15/17 Ocie BobJames Taleeya Blondin, MPT Physical Therapist with [facility] [address] [phone] office [phone] mobile phone

## 2017-10-15 NOTE — Progress Notes (Signed)
Pt called out for Tylenol d/t L leg pain. When RN came in room to get Tylenol pt asked for something to drink. RN went out to get pt something to drink and IVF to hang. Upon returning to room pt stated she had thrown up in the trash can (RN did not see any throw up, but a clear spit in the trash can). Rn gave pt some zofran and pt refused the tylenol after RN had opened it. Will waste in pyxis.  RN adv pt to call me back when she no longer felt like she was going to throw up. Will continue to monitor pt

## 2017-10-15 NOTE — Plan of Care (Addendum)
  Note of Medical Necessity  10/15/2017 10:08 AM  The patient has presented with acute/subacute encephalopathy with left-sided numbness of unknown etiology and continues to have ongoing confusion and intermittent episodes of encephalopathy.  The patient has been seen by neurologist who is recommending lumbar puncture with CSF testing for further workup.  Because the patient remains confused without specific cause found I feel that the lumbar puncture is medically necessary at this time to help us evaluate for treatable causes of the encephalopathy and confusion.  I spoke with the patient's daughter at the bedside this morning and I explained the procedure to them including possible side effects and she agreed to proceed with procedure.  The patient asked that her daughter make her medical decisions at this time.  Maryln Manuel. Humbert Morozov MD  10/15/2017 10:10 AM

## 2017-10-15 NOTE — Evaluation (Signed)
Occupational Therapy Evaluation Patient Details Name: MILTA CROSON MRN: [id number] DOB: 11-17-1965 Today's Date: 10/15/2017    History of Present Illness Amanda [name]  is a 52 y.o. female, with history of diabetes mellitus type 2 on oral hypoglycemics, arthritis, fibromyalgia, GERD, hypertension and history of migraines was brought to the ED by family with progressive decline in mental status for past 4 days.  Patient was seen at [facility] 1 week ago with acute gastroenteritis with symptoms of nausea, vomiting and diarrhea and found to have mild AK I.  She got IV fluids in the ED and was discharged home.  Since then family has found her to be less interactive, sleepy most of the time and confused.  She was then taken to the ED 3 days later with complaints of generalized weakness, nausea and some abdominal pain.  She was again given IV fluids, abdominal x-ray unremarkable and given antiemetics for suspected gastroenteritis.  Since then she has been increasingly confused and unable to communicate properly, has been confused even telling her name or calling out for her children.  History provided by patient's son and daughter at bedside who informed that patient was complaining of numbness in the left side of her arm and face, complaint of some chills and was still having off-and-on vomiting and loose stools.   Clinical Impression   Pt received supine in bed, agreeable to OT evaluation. Pt's family arrived shortly after beginning evaluation. Pt able to answer simple questions today, elaborating on home set-up and PLOF, increased time required to finish thoughts. Pt demonstrates ADL completion at supervision/min guard level. Pt demonstrates LUE weakness and decreased grip strength, however on observation is able to grip walker, reach for paper towels on left side, and turn faucet on/off without difficulty using LUE. Recommend HHOT evaluation to determine functioning and needs in home  environment as well as improve LUE strength required for ADL completion. Also recommend shower seat on discharge. Will continue to follow in acute care.     Follow Up Recommendations  Home health OT;Supervision/Assistance - 24 hour    Equipment Recommendations  Tub/shower seat       Precautions / Restrictions Precautions Precautions: Fall Restrictions Weight Bearing Restrictions: No      Mobility Bed Mobility Overal bed mobility: Needs Assistance Bed Mobility: Supine to Sit     Supine to sit: Supervision;HOB elevated     General bed mobility comments: slow labored movement  Transfers Overall transfer level: Needs assistance Equipment used: Rolling walker (2 wheeled) Transfers: Sit to/from [employer] Sit to Stand: Min assist Stand pivot transfers: Min guard                ADL either performed or assessed with clinical judgement   ADL Overall ADL's : Needs assistance/impaired Eating/Feeding: Supervision/ safety;Sitting   Grooming: Wash/dry hands;Min Actor Transfer: Chiropractor;Ambulation   Toileting- Clothing Manipulation and Hygiene: Supervision/safety;Sitting/lateral lean       Functional mobility during ADLs: Min guard;Rolling walker       Vision Baseline Vision/History: No visual deficits Patient Visual Report: Other (comment)(dtr reports complaints, pt unable to elaborate) Vision Assessment?: Yes Eye Alignment: Within Functional Limits Ocular Range of Motion: Within Functional Limits Alignment/Gaze Preference: Within Defined Limits Tracking/Visual Pursuits: Decreased smoothness of horizontal tracking;Decreased smoothness of vertical tracking Saccades: Within functional limits Convergence: Within functional limits Visual Fields: No apparent deficits  Pertinent Vitals/Pain Pain Assessment: Faces Faces Pain Scale: Hurts a little bit Pain Location: BLE Pain  Descriptors / Indicators: Aching Pain Intervention(s): Limited activity within patient's tolerance;Monitored during session;Repositioned     Hand Dominance Right   Extremity/Trunk Assessment Upper Extremity Assessment Upper Extremity Assessment: LUE deficits/detail LUE Deficits / Details: BUE A/ROM 50%; P/ROM WFL. LUE strength 3-/5, decreased grip strength LUE Sensation: WNL LUE Coordination: WNL       Cervical / Trunk Assessment Cervical / Trunk Assessment: Normal   Communication Communication Communication: No difficulties(slow to answer questions)   Cognition Arousal/Alertness: Awake/alert Behavior During Therapy: WFL for tasks assessed/performed Overall Cognitive Status: Impaired/Different from baseline                                                Home Living Family/patient expects to be discharged to:: Private residence Living Arrangements: Children(son-DJ) Available Help at Discharge: Family Type of Home: House Home Access: Stairs to enter Secretary/administratorntrance Stairs-Number of Steps: 3 Entrance Stairs-Rails: Right;Left;Can reach both Home Layout: One level     Bathroom Shower/Tub: Chief Strategy OfficerTub/shower unit   Bathroom Toilet: Standard     Home Equipment: None      Lives With: Son    Prior Functioning/Environment Level of Independence: Independent        Comments: driving, going to school for [employer]         OT Problem List: Decreased strength;Decreased range of motion;Decreased activity tolerance;Impaired balance (sitting and/or standing);Decreased cognition;Decreased safety awareness;Decreased knowledge of use of DME or AE;Impaired UE functional use      OT Treatment/Interventions: Self-care/ADL training;Therapeutic exercise;Neuromuscular education;DME and/or AE instruction;Therapeutic activities;Patient/family education    OT Goals(Current goals can be found in the care plan section) Acute Rehab OT Goals Patient Stated Goal: return home with family to  assist OT Goal Formulation: With patient Time For Goal Achievement: 10/29/17 Potential to Achieve Goals: Good  OT Frequency: Min 2X/week              AM-PAC PT "6 Clicks" Daily Activity     Outcome Measure Help from another person eating meals?: None Help from another person taking care of personal grooming?: None Help from another person toileting, which includes using toliet, bedpan, or urinal?: None Help from another person bathing (including washing, rinsing, drying)?: A Little Help from another person to put on and taking off regular upper body clothing?: A Little Help from another person to put on and taking off regular lower body clothing?: A Little 6 Click Score: 21   End of Session Equipment Utilized During Treatment: Gait belt;Rolling walker  Activity Tolerance: Patient limited by fatigue Patient left: in chair;with call bell/phone within reach;with chair alarm set;with family/visitor present  OT Visit Diagnosis: Muscle weakness (generalized) (M62.81)                Time: [id number] OT Time Calculation (min): 41 min Charges:  OT General Charges $OT Visit: 1 Visit OT Evaluation $OT Eval Low Complexity: 1 Low    Ezra SitesLeslie Theron [place], OTR/L  [phone] 10/15/2017, 8:23 AM

## 2017-10-15 NOTE — Progress Notes (Signed)
PROGRESS NOTE    Amanda [name]  JXB:[id number]  DOB: 1966/02/03  DOA: 10/14/2017 PCP: Margaretann Loveless, MD   Brief Admission Hx: 52 y.o. female, with history of diabetes mellitus type 2 on oral hypoglycemics, arthritis, fibromyalgia, GERD, hypertension and history of migraines was brought to the ED by family with progressive decline in mental status for past 4 days.  Head CT negative for acute findings.  Patient has multiple piercing in her shoulder and back and could not get an MRI.  MDM/Assessment & Plan:   1. Acute/subacute encephalopathy -patient continues to have intermittent episodes of confusion and encephalopathy.  She has been seen by the neurologist and further evaluation with lumbar puncture and CSF testing.  The patient has an daughter has agreed to proceed with the lumbar puncture today.  CSF studies have been ordered for cryptococcal antigen, HSV testing, Gram stain, protein, cell counts, VDRL. 2. Type 2 diabetes mellitus-monitor and treat with sliding scale coverage. 3. Hypokalemia-repleted and will continue to follow. 4. Nausea vomiting/diarrhea- improving with supportive care. 5. Essential hypertension- hydralazine ordered.  Resume home metoprolol.  DVT prophylaxis: SCD Code Status: Full Family Communication: Daughter at bedside Disposition Plan: Determined   Consultants:  Neurology  Interventional radiology  Procedures:  Lumbar puncture 2/13 pending  Subjective: Patient remains confused but is slightly improved per family.  Objective: Vitals:   10/14/17 1750 10/14/17 2150 10/15/17 0200 10/15/17 0647  BP: (!) 150/92 (!) 162/97 (!) 156/91 (!) 164/96  Pulse:  94 97 89  Resp: 19 18 16 16   Temp: 98.3 F (36.8 C) 98.2 F (36.8 C) 97.9 F (36.6 C) 97.8 F (36.6 C)  TempSrc: Oral Oral Oral Oral  SpO2: 100% 100% 99% 99%  Weight:    91.2 kg (200 lb 15.9 oz)  Height:        Intake/Output Summary (Last 24 hours) at 10/15/2017 1012 Last data filed at  10/14/2017 2255 Gross per 24 hour  Intake 240 ml  Output -  Net 240 ml   Filed Weights   10/14/17 0545 10/15/17 0647  Weight: 91.2 kg (201 lb) 91.2 kg (200 lb 15.9 oz)     REVIEW OF SYSTEMS  As per history otherwise all reviewed and reported negative  Exam:  General exam: awake, alert, sitting up, NAD, cooperative.  Respiratory system: Clear. No increased work of breathing. Cardiovascular system: S1 & S2 heard, RRR. No JVD, murmurs, gallops, clicks or pedal edema. Gastrointestinal system: Abdomen is nondistended, soft and nontender. Normal bowel sounds heard. Central nervous system: Alert and oriented. No focal neurological deficits. Extremities: no CCE.  Data Reviewed: Basic Metabolic Panel: Recent Labs  Lab 10/10/17 1028 10/14/17 0545  NA 144 145  K 3.3* 3.4*  CL 105 110  CO2 25 22  GLUCOSE 123* 142*  BUN 11 18  CREATININE 1.00 0.99  CALCIUM 10.5* 9.9   Liver Function Tests: Recent Labs  Lab 10/14/17 0545  AST 47*  ALT 51  ALKPHOS 125  BILITOT 0.1*  PROT 8.4*  ALBUMIN 4.5   No results for input(s): LIPASE, AMYLASE in the last 168 hours. Recent Labs  Lab 10/14/17 0545  AMMONIA 20   CBC: Recent Labs  Lab 10/10/17 1028 10/14/17 0545  WBC 4.6 5.6  NEUTROABS 2.4  --   HGB 14.2 14.7  HCT 43.3 44.5  MCV 90.0 90.6  PLT 246 278   Cardiac Enzymes: No results for input(s): CKTOTAL, CKMB, CKMBINDEX, TROPONINI in the last 168 hours. CBG (last 3)  Recent  Labs    10/14/17 2207 10/15/17 0322 10/15/17 0742  GLUCAP 124* 145* 141*   Recent Results (from the past 240 hour(s))  Culture, blood (routine x 2)     Status: None (Preliminary result)   Collection Time: 10/14/17 11:58 AM  Result Value Ref Range Status   Specimen Description BLOOD LEFT ANTECUBITAL  Final   Special Requests   Final    BOTTLES DRAWN AEROBIC AND ANAEROBIC Blood Culture adequate volume   Culture   Final    NO GROWTH < 24 HOURS Performed at [facility], [address]  [place], [place] [address]    Report Status PENDING  Incomplete  Culture, blood (routine x 2)     Status: None (Preliminary result)   Collection Time: 10/14/17 11:58 AM  Result Value Ref Range Status   Specimen Description BLOOD LEFT ARM  Final   Special Requests   Final    BOTTLES DRAWN AEROBIC ONLY Blood Culture results may not be optimal due to an inadequate volume of blood received in culture bottles   Culture   Final    NO GROWTH < 24 HOURS Performed at [facility], [address] [place], [place] [address]    Report Status PENDING  Incomplete     Studies: Ct Angio Head W Or Wo Contrast  Result Date: 10/14/2017 CLINICAL DATA:  Altered mental status and ataxia. EXAM: CT ANGIOGRAPHY HEAD AND NECK TECHNIQUE: Multidetector CT imaging of the head and neck was performed using the standard protocol during bolus administration of intravenous contrast. Multiplanar CT image reconstructions and MIPs were obtained to evaluate the vascular anatomy. Carotid stenosis measurements (when applicable) are obtained utilizing NASCET criteria, using the distal internal carotid diameter as the denominator. CONTRAST:  80mL ISOVUE-370 IOPAMIDOL (ISOVUE-370) INJECTION 76% COMPARISON:  Head CT 10/14/2017 FINDINGS: CTA NECK FINDINGS Aortic arch: There is no calcific atherosclerosis of the aortic arch. There is no aneurysm, dissection or hemodynamically significant stenosis of the visualized ascending aorta and aortic arch. Normal variant aortic arch branching pattern with the brachiocephalic and left common carotid arteries sharing a common origin. The visualized proximal subclavian arteries are normal. Right carotid system: The right common carotid origin is widely patent. There is no common carotid or internal carotid artery dissection or aneurysm. No hemodynamically significant stenosis. Left carotid system: The left common carotid origin is widely patent. There is no common carotid or internal carotid artery dissection  or aneurysm. No hemodynamically significant stenosis. Vertebral arteries: The vertebral system is left dominant. Both vertebral artery origins are normal. Both vertebral arteries are normal to their confluence with the basilar artery. Skeleton: There is no bony spinal canal stenosis. No lytic or blastic lesions. Mild cervical degenerative disc disease. Other neck: The nasopharynx is clear. The oropharynx and hypopharynx are normal. The epiglottis is normal. The supraglottic larynx, glottis and subglottic larynx are normal. No retropharyngeal collection. The parapharyngeal spaces are preserved. The parotid and submandibular glands are normal. No sialolithiasis or salivary ductal dilatation. The thyroid gland is normal. There is no cervical lymphadenopathy. Upper chest: No pneumothorax or pleural effusion. No nodules or masses. Review of the MIP images confirms the above findings CTA HEAD FINDINGS Anterior circulation: --Intracranial internal carotid arteries: Normal. --Anterior cerebral arteries: Normal. --Middle cerebral arteries: Normal. --Posterior communicating arteries: Present bilaterally. Posterior circulation: --Posterior cerebral arteries: Normal. --Superior cerebellar arteries: Normal. --Basilar artery: Normal. --Anterior inferior cerebellar arteries: Normal. --Posterior inferior cerebellar arteries: Normal. Venous sinuses: As permitted by contrast timing, patent. Anatomic variants: None Delayed  phase: No parenchymal contrast enhancement. Review of the MIP images confirms the above findings. IMPRESSION: Normal CTA of the head and neck. Electronically Signed   By: Deatra RobinsonKevin  Herman M.D.   On: 10/14/2017 13:36   Dg Chest 1 View  Result Date: 10/14/2017 CLINICAL DATA:  Chest pain and shortness of breath. History of asthma, hypertension, and diabetes. Nonsmoker. EXAM: CHEST 1 VIEW COMPARISON:  Chest x-ray of June 10, 2017 FINDINGS: The lungs are adequately inflated and clear. The heart is top-normal in size.  The pulmonary vascularity is normal. There is no pleural effusion. The trachea is midline. The bony thorax exhibits no acute abnormality. IMPRESSION: There is no active cardiopulmonary disease. Electronically Signed   By: David  SwazilandJordan M.D.   On: 10/14/2017 07:26   Ct Head Wo Contrast  Result Date: 10/14/2017 CLINICAL DATA:  Altered level of consciousness today. EXAM: CT HEAD WITHOUT CONTRAST TECHNIQUE: Contiguous axial images were obtained from the base of the skull through the vertex without intravenous contrast. COMPARISON:  Head CT scan 12/01/2015. FINDINGS: Brain: No evidence of acute infarction, hemorrhage, hydrocephalus, extra-axial collection or mass lesion/mass effect. Vascular: No hyperdense vessel or unexpected calcification. Skull: Intact. Sinuses/Orbits: Negative. Other: None. IMPRESSION: Negative head CT. Electronically Signed   By: Drusilla Kannerhomas  Dalessio M.D.   On: 10/14/2017 07:40   Ct Angio Neck W Or Wo Contrast  Result Date: 10/14/2017 CLINICAL DATA:  Altered mental status and ataxia. EXAM: CT ANGIOGRAPHY HEAD AND NECK TECHNIQUE: Multidetector CT imaging of the head and neck was performed using the standard protocol during bolus administration of intravenous contrast. Multiplanar CT image reconstructions and MIPs were obtained to evaluate the vascular anatomy. Carotid stenosis measurements (when applicable) are obtained utilizing NASCET criteria, using the distal internal carotid diameter as the denominator. CONTRAST:  80mL ISOVUE-370 IOPAMIDOL (ISOVUE-370) INJECTION 76% COMPARISON:  Head CT 10/14/2017 FINDINGS: CTA NECK FINDINGS Aortic arch: There is no calcific atherosclerosis of the aortic arch. There is no aneurysm, dissection or hemodynamically significant stenosis of the visualized ascending aorta and aortic arch. Normal variant aortic arch branching pattern with the brachiocephalic and left common carotid arteries sharing a common origin. The visualized proximal subclavian arteries are  normal. Right carotid system: The right common carotid origin is widely patent. There is no common carotid or internal carotid artery dissection or aneurysm. No hemodynamically significant stenosis. Left carotid system: The left common carotid origin is widely patent. There is no common carotid or internal carotid artery dissection or aneurysm. No hemodynamically significant stenosis. Vertebral arteries: The vertebral system is left dominant. Both vertebral artery origins are normal. Both vertebral arteries are normal to their confluence with the basilar artery. Skeleton: There is no bony spinal canal stenosis. No lytic or blastic lesions. Mild cervical degenerative disc disease. Other neck: The nasopharynx is clear. The oropharynx and hypopharynx are normal. The epiglottis is normal. The supraglottic larynx, glottis and subglottic larynx are normal. No retropharyngeal collection. The parapharyngeal spaces are preserved. The parotid and submandibular glands are normal. No sialolithiasis or salivary ductal dilatation. The thyroid gland is normal. There is no cervical lymphadenopathy. Upper chest: No pneumothorax or pleural effusion. No nodules or masses. Review of the MIP images confirms the above findings CTA HEAD FINDINGS Anterior circulation: --Intracranial internal carotid arteries: Normal. --Anterior cerebral arteries: Normal. --Middle cerebral arteries: Normal. --Posterior communicating arteries: Present bilaterally. Posterior circulation: --Posterior cerebral arteries: Normal. --Superior cerebellar arteries: Normal. --Basilar artery: Normal. --Anterior inferior cerebellar arteries: Normal. --Posterior inferior cerebellar arteries: Normal. Venous sinuses: As permitted by  contrast timing, patent. Anatomic variants: None Delayed phase: No parenchymal contrast enhancement. Review of the MIP images confirms the above findings. IMPRESSION: Normal CTA of the head and neck. Electronically Signed   By: Deatra Robinson  M.D.   On: 10/14/2017 13:36     Scheduled Meds: . aspirin  300 mg Rectal Daily   Or  . aspirin  325 mg Oral Daily  . insulin aspart  0-15 Units Subcutaneous TID WC  . insulin aspart  0-5 Units Subcutaneous QHS  . pantoprazole (PROTONIX) IV  40 mg Intravenous Q24H   Continuous Infusions: . 0.9 % NaCl with KCl 20 mEq / L 75 mL/hr at 10/14/17 1127    Principal Problem:   Acute encephalopathy Active Problems:   Depression   Diabetes mellitus, type 2 (HCC)   Esophageal reflux   Nausea and vomiting   Hypokalemia   Elevated blood pressure reading   Time spent:   Standley Dakins, MD, FAAFP [facility] Pager [phone]  If 7PM-7AM, please contact night-coverage [email or web address] Password [email or web address] 10/15/2017, 10:12 AM    LOS: 0 days

## 2017-10-15 NOTE — Progress Notes (Signed)
[place] A. Merlene Laughter, MD     [email or web address]          Amanda [name] is an 52 y.o. female.   ASSESSMENT/PLAN:  UDATE   Spinal fluid analysis so far been unrevealing. We will follow-up the complete results. The patient most likely has a systemic viral infection. The initial spinal fluid analysis is unrevealing and does not support CNS infection or other processes so far.    1. Acute/subacute encephalopathy with left-sided numbness of unclear etiology: Imaging with CT failed to show potential etiology. It is possible she could have a small infarct not seen on CT although I would not expect small infarct to cause confusion without showing up on CT. She does appear to have a viral illness with the nausea vomiting and diarrhea. EEG has not showed changes worrisome for seizures. Spinal tap will be recommended for additional workup. Continue with repeat neurological assessments.    She indicates that she is feeling better but she still has abdominal complaints, nausea and diarrhea. She also complains of low back pain status post spinal tap. She thinks her cognition is slowly improving.   GENERAL: She appears to be somewhat weak but in no acute distress.  HEENT: Neck is supple.  ABDOMEN: soft  EXTREMITIES: No edema   BACK: Normal  SKIN: Normal by inspection.    MENTAL STATUS: Alert and oriented. Speech, language and cognition are generally intact. Judgment and insight normal.   CRANIAL NERVES: Pupils are equal, round and reactive to light and accomodation; extra ocular movements are full, there is no significant nystagmus; visual fields are full; upper and lower facial muscles are normal in strength and symmetric, there is no flattening of the nasolabial folds; tongue is midline; uvula is midline; shoulder elevation is normal.  MOTOR: Left upper and left lower extremities graded as 4/5. Right upper extremity 4+/5 and right leg 5. Bulk and tone are normal  throughout.  COORDINATION: Left finger to nose is normal, right finger to nose is normal, No rest tremor; no intention tremor; no postural tremor; no bradykinesia.  REFLEXES: Deep tendon reflexes are symmetrical and normal. Plantar reflexes are flexor bilaterally.   SENSATION: No clear deficit to temperature light touch.        Blood pressure (!) 146/90, pulse (!) 114, temperature 98.4 F (36.9 C), temperature source Oral, resp. rate 18, height 5' 5"  (1.651 m), weight 200 lb 15.9 oz (91.2 kg), SpO2 99 %.  Past Medical History:  Diagnosis Date  . Arthritis    knees, shoulder, upper back  . Asthma   . Diabetes mellitus without complication ([place])   . Environmental allergies   . Fibromyalgia   . GERD (gastroesophageal reflux disease)   . Headache    migraines - 5x/mo  . Hypertension   . Hypokalemia 10/14/2017  . Migraines   . Motion sickness    ships  . Sleep apnea   . Thyroid nodule    bilateral and goiter  . Wears contact lenses   . Wears dentures    partial upper    Past Surgical History:  Procedure Laterality Date  . ABDOMINAL HYSTERECTOMY  2000's  . ABDOMINAL SURGERY     laparoscopy x4 with lysis of adhesions  . APPENDECTOMY  1986  . CESAREAN SECTION  1992  . CHOLECYSTECTOMY N/A 04/03/2017   Procedure: LAPAROSCOPIC CHOLECYSTECTOMY;  Surgeon: Olean Ree, MD;  Location: [facility] ORS;  Service: General;  Laterality: N/A;  . COLONOSCOPY WITH PROPOFOL N/A 03/10/2017  Procedure: COLONOSCOPY WITH PROPOFOL;  Surgeon: Manya Silvas, MD;  Location: [facility] ENDOSCOPY;  Service: Endoscopy;  Laterality: N/A;  . ECTOPIC PREGNANCY SURGERY  2000's  . ESOPHAGOGASTRODUODENOSCOPY (EGD) WITH PROPOFOL N/A 03/10/2017   Procedure: ESOPHAGOGASTRODUODENOSCOPY (EGD) WITH PROPOFOL;  Surgeon: Manya Silvas, MD;  Location: [facility] ENDOSCOPY;  Service: Endoscopy;  Laterality: N/A;  . HERNIA REPAIR    . JOINT REPLACEMENT Right 12/16/12   knee- medial - makoplasty  . KNEE ARTHROSCOPY Right 2006     partial medial and lateral meniscectomies  . KNEE ARTHROSCOPY Right 10/06/2015   Procedure: RIGHT KNEE ARTHROSCOPY WITH DEBRIDEMENT;  Surgeon: Leanor Kail, MD;  Location: [facility];  Service: Orthopedics;  Laterality: Right;  Diabetic - insulin and oral meds  . ROTATOR CUFF REPAIR Left 2006  . TUBAL LIGATION  2000's    Family History  Problem Relation Age of Onset  . Hypertension Mother   . Hypertension Father   . Diabetes Father   . Allergies Father     Social History:  reports that  has never smoked. she has never used smokeless tobacco. She reports that she does not drink alcohol or use drugs.  Allergies:  Allergies  Allergen Reactions  . Penicillins Anaphylaxis    Has patient had a PCN reaction causing immediate rash, facial/tongue/throat swelling, SOB or lightheadedness with hypotension: Yes Has patient had a PCN reaction causing severe rash involving mucus membranes or skin necrosis: No Has patient had a PCN reaction that required hospitalization No Has patient had a PCN reaction occurring within the last 10 years: No If all of the above answers are "NO", then may proceed with Cephalosporin use.   . Ace Inhibitors Swelling    Medications: Prior to Admission medications   Medication Sig Start Date End Date Taking? Authorizing Provider  albuterol (PROVENTIL HFA) 108 (90 Base) MCG/ACT inhaler Inhale 2 puffs into the lungs every 4 (four) hours as needed for wheezing or shortness of breath. 04/16/17  Yes Laverle Hobby, MD  Ascorbic Acid (VITAMIN C) 1000 MG tablet Take 1,000 mg by mouth daily.   Yes [provider]  BREO ELLIPTA 200-25 MCG/INH AEPB Inhale 1 puff into the lungs daily. 09/26/17  Yes Laverle Hobby, MD  canagliflozin (INVOKANA) 300 MG TABS tablet Take 300 mg by mouth daily before breakfast.   Yes [provider]  flunisolide (NASALIDE) 25 MCG/ACT (0.025%) SOLN Place 2 sprays into the nose daily as needed.   Yes [provider]  insulin detemir (LEVEMIR) 100 UNIT/ML injection Inject 10 Units into the skin at bedtime.   Yes [provider]  ipratropium-albuterol (DUONEB) 0.5-2.5 (3) MG/3ML SOLN Take 3 mLs by nebulization every 4 (four) hours as needed.   Yes [provider]  isosorbide mononitrate (IMDUR) 30 MG 24 hr tablet Take 0.5 tablets (15 mg total) by mouth daily. Patient taking differently: Take 30 mg by mouth daily.  06/06/17  Yes Epifanio Lesches, MD  LINZESS 290 MCG CAPS capsule Take 1 tablet by mouth daily. 02/28/17  Yes [provider]  metFORMIN (GLUCOPHAGE) 1000 MG tablet Take 1,000 mg by mouth 2 (two) times daily with a meal.    Yes [provider]  metoprolol succinate (TOPROL-XL) 50 MG 24 hr tablet Take 50 mg by mouth daily. Take with or immediately following a meal.   Yes [provider]  montelukast (SINGULAIR) 10 MG tablet Take 1 tablet (10 mg total) by mouth daily. 06/30/17 06/30/18 Yes Laverle Hobby, MD  Multiple Vitamin (  MULTIVITAMIN) tablet Take 1 tablet by mouth daily.   Yes [provider]  omeprazole (PRILOSEC) 40 MG capsule Take 1 capsule (40 mg total) by mouth daily. 1 hr before supper 04/05/17  Yes Piscoya, Jose, MD  polyethylene glycol (MIRALAX / GLYCOLAX) packet Take 17 g by mouth daily. Mix one tablespoon with 8oz of your favorite juice or water every day until you are having soft formed stools. Then start taking once daily if you didn't have a stool the day before. 04/17/17  Yes Merlyn Lot, MD  pravastatin (PRAVACHOL) 40 MG tablet Take 40 mg by mouth daily.   Yes [provider]  promethazine (PHENERGAN) 25 MG tablet Take 1 tablet (25 mg total) by mouth every 6 (six) hours as needed for nausea or vomiting. 10/10/17  Yes Lavonia Drafts, MD  protein supplement shake (PREMIER PROTEIN) LIQD Take 325 mLs (11 oz total) by mouth 3 (three) times daily between meals. 04/05/17  Yes Piscoya, Jose, MD  sucralfate  (CARAFATE) 1 g tablet Take 1 g by mouth 2 (two) times daily.    Yes [provider]  SUMAtriptan (IMITREX) 25 MG tablet Take 25 mg by mouth every 2 (two) hours as needed for migraine. May repeat in 2 hours if headache persists or recurs.   Yes [provider]  tiZANidine (ZANAFLEX) 4 MG tablet Take 4 mg by mouth every 6 (six) hours as needed for muscle spasms.   Yes [provider]  zolpidem (AMBIEN) 5 MG tablet Take 5 mg by mouth at bedtime.    Yes [provider]    Scheduled Meds: . aspirin  300 mg Rectal Daily   Or  . aspirin  325 mg Oral Daily  . insulin aspart  0-15 Units Subcutaneous TID WC  . insulin aspart  0-5 Units Subcutaneous QHS  . metoprolol succinate  50 mg Oral Daily  . pantoprazole (PROTONIX) IV  40 mg Intravenous Q24H   Continuous Infusions: . 0.9 % NaCl with KCl 20 mEq / L 75 mL/hr at 10/15/17 1740   PRN Meds:.acetaminophen **OR** acetaminophen (TYLENOL) oral liquid 160 mg/5 mL **OR** acetaminophen, hydrALAZINE, ondansetron (ZOFRAN) IV, senna-docusate     Results for orders placed or performed during the hospital encounter of 10/14/17 (from the past 48 hour(s))  Comprehensive metabolic panel     Status: Abnormal   Collection Time: 10/14/17  5:45 AM  Result Value Ref Range   Sodium 145 135 - 145 mmol/L   Potassium 3.4 (L) 3.5 - 5.1 mmol/L   Chloride 110 101 - 111 mmol/L   CO2 22 22 - 32 mmol/L   Glucose, Bld 142 (H) 65 - 99 mg/dL   BUN 18 6 - 20 mg/dL   Creatinine, Ser 0.99 0.44 - 1.00 mg/dL   Calcium 9.9 8.9 - 10.3 mg/dL   Total Protein 8.4 (H) 6.5 - 8.1 g/dL   Albumin 4.5 3.5 - 5.0 g/dL   AST 47 (H) 15 - 41 U/L   ALT 51 14 - 54 U/L   Alkaline Phosphatase 125 38 - 126 U/L   Total Bilirubin 0.1 (L) 0.3 - 1.2 mg/dL   GFR calc non Af Amer >60 >60 mL/min   GFR calc Af Amer >60 >60 mL/min    Comment: (NOTE) The eGFR has been calculated using the CKD EPI equation. This calculation has not been validated in all clinical  situations. eGFR's persistently <60 mL/min signify possible Chronic Kidney Disease.    Anion gap 13 5 - 15  Comment: Performed at [facility], [address], [place], [place] [address]  CBC     Status: None   Collection Time: 10/14/17  5:45 AM  Result Value Ref Range   WBC 5.6 4.0 - 10.5 K/uL   RBC 4.91 3.87 - 5.11 MIL/uL   Hemoglobin 14.7 12.0 - 15.0 g/dL   HCT 44.5 36.0 - 46.0 %   MCV 90.6 78.0 - 100.0 fL   MCH 29.9 26.0 - 34.0 pg   MCHC 33.0 30.0 - 36.0 g/dL   RDW 14.1 11.5 - 15.5 %   Platelets 278 150 - 400 K/uL    Comment: Performed at [facility] [address], [place], [place] [address]  Ammonia     Status: None   Collection Time: 10/14/17  5:45 AM  Result Value Ref Range   Ammonia 20 9 - 35 umol/L    Comment: Performed at [facility], [address] [place], [place] [address]  Hemoglobin A1c     Status: Abnormal   Collection Time: 10/14/17  5:49 AM  Result Value Ref Range   Hgb A1c MFr Bld 7.6 (H) 4.8 - 5.6 %    Comment: (NOTE) Pre diabetes:          5.7%-6.4% Diabetes:              >6.4% Glycemic control for   <7.0% adults with diabetes    Mean Plasma Glucose 171.42 mg/dL    Comment: Performed at [facility] [address], [place], [place] [address]  Lipid panel     Status: None   Collection Time: 10/14/17  5:49 AM  Result Value Ref Range   Cholesterol 162 0 - 200 mg/dL   Triglycerides 86 <150 mg/dL   HDL 73 >40 mg/dL   Total CHOL/HDL Ratio 2.2 RATIO   VLDL 17 0 - 40 mg/dL   LDL Cholesterol 72 0 - 99 mg/dL    Comment:        Total Cholesterol/HDL:CHD Risk Coronary Heart Disease Risk Table                     Men   Women  1/2 Average Risk   3.4   3.3  Average Risk       5.0   4.4  2 X Average Risk   9.6   7.1  3 X Average Risk  23.4   11.0        Use the calculated Patient Ratio above and the CHD Risk Table to determine the patient's CHD Risk.        ATP III CLASSIFICATION (LDL):  <100     mg/dL   Optimal  100-129  mg/dL   Near or  Above                    Optimal  130-159  mg/dL   Borderline  160-189  mg/dL   High  >190     mg/dL   Very High Performed at [facility], [address], [place], [place] [address]   CBG monitoring, ED     Status: Abnormal   Collection Time: 10/14/17  5:53 AM  Result Value Ref Range   Glucose-Capillary 133 (H) 65 - 99 mg/dL  Urinalysis, Routine w reflex microscopic     Status: Abnormal   Collection Time: 10/14/17  6:29 AM  Result Value Ref Range   Color, Urine YELLOW YELLOW   APPearance CLEAR CLEAR   Specific  Gravity, Urine 1.018 1.005 - 1.030   pH 7.0 5.0 - 8.0   Glucose, UA NEGATIVE NEGATIVE mg/dL   Hgb urine dipstick LARGE (A) NEGATIVE   Bilirubin Urine NEGATIVE NEGATIVE   Ketones, ur NEGATIVE NEGATIVE mg/dL   Protein, ur NEGATIVE NEGATIVE mg/dL   Nitrite NEGATIVE NEGATIVE   Leukocytes, UA NEGATIVE NEGATIVE   RBC / HPF TOO NUMEROUS TO COUNT 0 - 5 RBC/hpf   WBC, UA 0-5 0 - 5 WBC/hpf   Bacteria, UA NONE SEEN NONE SEEN   Squamous Epithelial / LPF 0-5 (A) NONE SEEN    Comment: Performed at [facility], [address] [place], [place] [address]  Urine rapid drug screen (hosp performed)     Status: Abnormal   Collection Time: 10/14/17  7:37 AM  Result Value Ref Range   Opiates NONE DETECTED NONE DETECTED   Cocaine NONE DETECTED NONE DETECTED   Benzodiazepines NONE DETECTED NONE DETECTED   Amphetamines NONE DETECTED NONE DETECTED   Tetrahydrocannabinol NONE DETECTED NONE DETECTED   Barbiturates POSITIVE (A) NONE DETECTED    Comment: (NOTE) DRUG SCREEN FOR MEDICAL PURPOSES ONLY.  IF CONFIRMATION IS NEEDED FOR ANY PURPOSE, NOTIFY LAB WITHIN 5 DAYS. LOWEST DETECTABLE LIMITS FOR URINE DRUG SCREEN Drug Class                     Cutoff (ng/mL) Amphetamine and metabolites    1000 Barbiturate and metabolites    200 Benzodiazepine                 371 Tricyclics and metabolites     300 Opiates and metabolites        300 Cocaine and metabolites        300 THC                             50 Performed at [facility], [address], [place], [place] [address]   TSH     Status: None   Collection Time: 10/14/17  8:48 AM  Result Value Ref Range   TSH 1.442 0.350 - 4.500 uIU/mL    Comment: Performed by a 3rd Generation assay with a functional sensitivity of <=0.01 uIU/mL. Performed at [facility], [address], [place], [place] [address]   Vitamin B12     Status: None   Collection Time: 10/14/17  8:49 AM  Result Value Ref Range   Vitamin B-12 463 180 - 914 pg/mL    Comment: (NOTE) This assay is not validated for testing neonatal or myeloproliferative syndrome specimens for Vitamin B12 levels. Performed at [facility], [place] [address] [place], [place] [address]   Culture, blood (routine x 2)     Status: None (Preliminary result)   Collection Time: 10/14/17 11:58 AM  Result Value Ref Range   Specimen Description BLOOD LEFT ANTECUBITAL    Special Requests      BOTTLES DRAWN AEROBIC AND ANAEROBIC Blood Culture adequate volume   Culture      NO GROWTH < 24 HOURS Performed at [facility], [address], [place], [place] [address]    Report Status PENDING   Culture, blood (routine x 2)     Status: None (Preliminary result)   Collection Time: 10/14/17 11:58 AM  Result Value Ref Range   Specimen Description BLOOD LEFT ARM    Special Requests      BOTTLES DRAWN AEROBIC ONLY Blood Culture results may not  be optimal due to an inadequate volume of blood received in culture bottles   Culture      NO GROWTH < 24 HOURS Performed at [facility], [address], [place], [place] [address]    Report Status PENDING   Glucose, capillary     Status: Abnormal   Collection Time: 10/14/17 12:20 PM  Result Value Ref Range   Glucose-Capillary 126 (H) 65 - 99 mg/dL   Comment 1 Document in Chart   Glucose, capillary     Status: Abnormal   Collection Time: 10/14/17  4:54 PM  Result Value Ref Range   Glucose-Capillary 143 (H) 65 - 99 mg/dL   Comment 1 Document in  Chart   Glucose, capillary     Status: Abnormal   Collection Time: 10/14/17 10:07 PM  Result Value Ref Range   Glucose-Capillary 124 (H) 65 - 99 mg/dL  Glucose, capillary     Status: Abnormal   Collection Time: 10/15/17  3:22 AM  Result Value Ref Range   Glucose-Capillary 145 (H) 65 - 99 mg/dL  Glucose, capillary     Status: Abnormal   Collection Time: 10/15/17  7:42 AM  Result Value Ref Range   Glucose-Capillary 141 (H) 65 - 99 mg/dL   Comment 1 Notify RN    Comment 2 Document in Chart   Glucose, capillary     Status: Abnormal   Collection Time: 10/15/17 12:00 PM  Result Value Ref Range   Glucose-Capillary 138 (H) 65 - 99 mg/dL   Comment 1 Notify RN    Comment 2 Document in Chart   Glucose, CSF     Status: Abnormal   Collection Time: 10/15/17  2:14 PM  Result Value Ref Range   Glucose, CSF 111 (H) 40 - 70 mg/dL    Comment: Performed at [facility], [address] [place], [place] [address]  Protein, CSF     Status: None   Collection Time: 10/15/17  2:14 PM  Result Value Ref Range   Total  Protein, CSF 42 15 - 45 mg/dL    Comment: Performed at [facility], [address] [place], [place] [address]  CSF cell count with differential     Status: Abnormal   Collection Time: 10/15/17  2:14 PM  Result Value Ref Range   Tube # 4    Color, CSF COLORLESS COLORLESS   Appearance, CSF CLEAR CLEAR   Supernatant COLORLESS    RBC Count, CSF 54 (H) 0 /cu mm   WBC, CSF 2 0 - 5 /cu mm   Segmented Neutrophils-CSF TOO FEW TO COUNT, SMEAR AVAILABLE FOR REVIEW 0 - 6 %   Lymphs, CSF TOO FEW TO COUNT, SMEAR AVAILABLE FOR REVIEW 40 - 80 %   Monocyte-Macrophage-Spinal Fluid TOO FEW TO COUNT, SMEAR AVAILABLE FOR REVIEW 15 - 45 %   Eosinophils, CSF TOO FEW TO COUNT, SMEAR AVAILABLE FOR REVIEW 0 - 1 %   Other Cells, CSF TOO FEW TO COUNT, SMEAR AVAILABLE FOR REVIEW     Comment: Performed at [facility], [address], [place], [place] [address]  CSF culture     Status: None (Preliminary result)    Collection Time: 10/15/17  2:14 PM  Result Value Ref Range   Specimen Description CSF    Special Requests NONE    Gram Stain      NO ORGANISMS SEEN RARE WBC PRESENT, PREDOMINANTLY MONONUCLEAR Performed at [facility], [address] [place], [place] [address]    Culture PENDING  Report Status PENDING   Cryptococcal antigen, CSF     Status: None   Collection Time: 10/15/17  2:14 PM  Result Value Ref Range   Crypto Ag NEGATIVE NEGATIVE   Cryptococcal Ag Titer NOT INDICATED NOT INDICATED    Comment: Performed at [facility], [place] [address], [place], [place] [address]  Glucose, capillary     Status: Abnormal   Collection Time: 10/15/17  5:09 PM  Result Value Ref Range   Glucose-Capillary 146 (H) 65 - 99 mg/dL   Comment 1 Notify RN    Comment 2 Document in Chart     Studies/Results:   EEG  Findings:  There is a posterior dominant rhythm of 7 Hz which is symmetrical.  No focal slowing is present.  Intermittently, there is generalized delta frequency slowing for a few seconds while awake.  There are no epileptiform discharges or electrographic seizures.  Hyperventilation and photic stimulation are not performed.  Impression:  This is an abnormal EEG.  There is evidence of mild generalized slowing of brain activity which intermittently becomes severe in nature for short bursts of time.  This is non-specific, but may be due to a toxic, metabolic, or infectious encephalopathy.  Clinical correlation is recommended.  The patient is not in non-convulsive status epilepticus.       HEAD NECK CTA FINDINGS: CTA NECK FINDINGS  Aortic arch: There is no calcific atherosclerosis of the aortic arch. There is no aneurysm, dissection or hemodynamically significant stenosis of the visualized ascending aorta and aortic arch. Normal variant aortic arch branching pattern with the brachiocephalic and left common carotid arteries sharing a common origin. The visualized proximal  subclavian arteries are normal.  Right carotid system: The right common carotid origin is widely patent. There is no common carotid or internal carotid artery dissection or aneurysm. No hemodynamically significant stenosis.  Left carotid system: The left common carotid origin is widely patent. There is no common carotid or internal carotid artery dissection or aneurysm. No hemodynamically significant stenosis.  Vertebral arteries: The vertebral system is left dominant. Both vertebral artery origins are normal. Both vertebral arteries are normal to their confluence with the basilar artery.  Skeleton: There is no bony spinal canal stenosis. No lytic or blastic lesions. Mild cervical degenerative disc disease.  Other neck: The nasopharynx is clear. The oropharynx and hypopharynx are normal. The epiglottis is normal. The supraglottic larynx, glottis and subglottic larynx are normal. No retropharyngeal collection. The parapharyngeal spaces are preserved. The parotid and submandibular glands are normal. No sialolithiasis or salivary ductal dilatation. The thyroid gland is normal. There is no cervical lymphadenopathy.  Upper chest: No pneumothorax or pleural effusion. No nodules or masses.  Review of the MIP images confirms the above findings  CTA HEAD FINDINGS  Anterior circulation:  --Intracranial internal carotid arteries: Normal.  --Anterior cerebral arteries: Normal.  --Middle cerebral arteries: Normal.  --Posterior communicating arteries: Present bilaterally.  Posterior circulation:  --Posterior cerebral arteries: Normal.  --Superior cerebellar arteries: Normal.  --Basilar artery: Normal.  --Anterior inferior cerebellar arteries: Normal.  --Posterior inferior cerebellar arteries: Normal.  Venous sinuses: As permitted by contrast timing, patent.  Anatomic variants: None  Delayed phase: No parenchymal contrast enhancement.  Review of the  MIP images confirms the above findings.  IMPRESSION: Normal CTA of the head and neck.       CT scan is reviewed in person and shows evidence of mild white matter disease bilaterally but nothing acute is appreciated.  Tysheena Ginzburg A. Merlene Laughter, M.D.  Diplomate, Tax adviser of Psychiatry and Neurology ( Neurology). 10/15/2017, 8:48 PM

## 2017-10-15 NOTE — Progress Notes (Signed)
  Speech Language Pathology Treatment: Cognitive-Linquistic  Patient Details Name: Amanda HarriesLeomia W [name] MRN: [id number] DOB: 1965-12-19 Today's Date: 10/15/2017 Time: [id number] SLP Time Calculation (min) (ACUTE ONLY): 51 min  Assessment / Plan / Recommendation Clinical Impression  Pt seen for ongoing cognitive linguistic intervention. Pt with improved mental status this date. She was noted to use her left arm spontaneously to show SLP her left ear piercing, but then c/o pain in arm after use. SLP then encouraged Pt to attempt to use her left arm as much as possible throughout the day. 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 The MoCA was administered today and Pt able to participate with very delayed responses for a score of 17/30 with deficits in attention, orientation, and memory. Pt intermittently able to converse in thoughtful, lengthy dialogue and at times, unable. Pt became tearful at times. She endorsed being under great stress with work and being unable to keep up with clients' demands in "tax season". Pt agreeable to working on her emotional well being and accepts that stress can have negative implications on emotional, physical, and neurological well being. She reports feeling confused about why she "can't remember words" and again becomes tearful. SLP will continue to follow. Recommend psych consult to further investigate current stressors.    HPI HPI: Amanda [name]  is a 52 y.o. female, with history of diabetes mellitus type 2 on oral hypoglycemics, arthritis, fibromyalgia, GERD, hypertension and history of migraines was brought to the ED by family with progressive decline in mental status for past 4 days.  Patient was seen at [facility] 1 week ago with acute  gastroenteritis with symptoms of nausea, vomiting and diarrhea and found to have mild AK I.  She got IV fluids in the ED and was discharged home.  Since then family has found her to be less interactive, sleepy most of the time and confused.  She was then taken to the ED 3 days later with complaints of generalized weakness, nausea and some abdominal pain.  She was again given IV fluids, abdominal x-ray unremarkable and given antiemetics for suspected gastroenteritis.  Since then she has been increasingly confused and unable to communicate properly, has been confused even telling her name or calling out for her children.  History provided by patient's son and daughter at bedside who informed that patient was complaining of numbness in the left side of her arm and face, complaint of some chills and was still having off-and-on vomiting and loose stools.       SLP Plan  Continue with current plan of care       Recommendations                   Follow up Recommendations: Other (comment);Outpatient SLP(psych consult) Plan: Continue with current plan of care       Thank you,  Havery MorosDabney Azahel Belcastro, CCC-SLP [phone]                 Lashunda Greis 10/15/2017, 11:49 AM

## 2017-10-15 NOTE — Plan of Care (Signed)
  No Outcome Acute Rehab OT Goals (only OT should resolve) Pt. Will Perform Grooming 10/15/2017 0826 by Geralynn Ochsroxler, Cabella Kimm A, OT Flowsheets Taken 10/15/2017 [id number]  Pt Will Perform Grooming with modified independence;standing Pt. Will Perform Lower Body Dressing 10/15/2017 0826 by Geralynn Ochsroxler, Demarrion Meiklejohn A, OT Flowsheets Taken 10/15/2017 0826  Pt Will Perform Lower Body Dressing with modified independence;with supervision;sit to/from stand Pt. Will Transfer To Toilet 10/15/2017 0826 by Geralynn Ochsroxler, Teon Hudnall A, OT Flowsheets Taken 10/15/2017 [phone]  Pt Will Transfer to Toilet with modified independence;ambulating;regular height toilet;grab bars Pt. Will Perform Toileting-Clothing Manipulation 10/15/2017 0826 by Geralynn Ochsroxler, Alma Mohiuddin A, OT Flowsheets Taken 10/15/2017 0826  Pt Will Perform Toileting - Clothing Manipulation and hygiene with modified independence;sitting/lateral leans;sit to/from stand Pt/Caregiver Will Perform Home Exercise Program 10/15/2017 0826 by Geralynn Ochsroxler, Nigel Ericsson A, OT Flowsheets Taken 10/15/2017 [phone]  Pt/caregiver will Perform Home Exercise Program Increased strength;Left upper extremity;Independently;With Supervision;With written HEP provided

## 2017-10-15 NOTE — Procedures (Signed)
Preprocedure Dx: AMS Postprocedure Dx: AMS Procedure:  Fluoroscopically guided lumbar puncture Radiologist:  Tyron RussellBoles Anesthesia:  2 ml of 1% lidocaine Specimen:  10 ml CSF, clear colorless EBL:   < 1 ml Complications: None

## 2017-10-16 LAB — CBC WITH DIFFERENTIAL/PLATELET
Basophils Absolute: 0 10*3/uL (ref 0.0–0.1)
Basophils Relative: 0 %
Eosinophils Absolute: 0 10*3/uL (ref 0.0–0.7)
Eosinophils Relative: 1 %
HCT: 39.8 % (ref 36.0–46.0)
Hemoglobin: 12.8 g/dL (ref 12.0–15.0)
Lymphocytes Relative: 39 %
Lymphs Abs: 2 10*3/uL (ref 0.7–4.0)
MCH: 28.9 pg (ref 26.0–34.0)
MCHC: 32.2 g/dL (ref 30.0–36.0)
MCV: 89.8 fL (ref 78.0–100.0)
Monocytes Absolute: 0.3 10*3/uL (ref 0.1–1.0)
Monocytes Relative: 6 %
Neutro Abs: 2.8 10*3/uL (ref 1.7–7.7)
Neutrophils Relative %: 54 %
Platelets: 255 10*3/uL (ref 150–400)
RBC: 4.43 MIL/uL (ref 3.87–5.11)
RDW: 14.6 % (ref 11.5–15.5)
WBC: 5.1 10*3/uL (ref 4.0–10.5)

## 2017-10-16 LAB — COMPREHENSIVE METABOLIC PANEL
ALT: 37 U/L (ref 14–54)
AST: 23 U/L (ref 15–41)
Albumin: 3.7 g/dL (ref 3.5–5.0)
Alkaline Phosphatase: 103 U/L (ref 38–126)
Anion gap: 9 (ref 5–15)
BUN: 7 mg/dL (ref 6–20)
CO2: 22 mmol/L (ref 22–32)
Calcium: 9.1 mg/dL (ref 8.9–10.3)
Chloride: 112 mmol/L — ABNORMAL HIGH (ref 101–111)
Creatinine, Ser: 0.8 mg/dL (ref 0.44–1.00)
GFR calc Af Amer: >60 mL/min (ref >60)
GFR calc non Af Amer: >60 mL/min (ref >60)
Glucose, Bld: 151 mg/dL — ABNORMAL HIGH (ref 65–99)
Potassium: 3.3 mmol/L — ABNORMAL LOW (ref 3.5–5.1)
Sodium: 143 mmol/L (ref 135–145)
Total Bilirubin: 0.3 mg/dL (ref 0.3–1.2)
Total Protein: 6.9 g/dL (ref 6.5–8.1)

## 2017-10-16 LAB — GLUCOSE, CAPILLARY
Glucose-Capillary: 153 mg/dL — ABNORMAL HIGH (ref 65–99)
Glucose-Capillary: 154 mg/dL — ABNORMAL HIGH (ref 65–99)
Glucose-Capillary: 130 mg/dL — ABNORMAL HIGH (ref 65–99)
Glucose-Capillary: 160 mg/dL — ABNORMAL HIGH (ref 65–99)

## 2017-10-16 LAB — HIV ANTIBODY (ROUTINE TESTING W REFLEX): HIV Screen 4th Generation wRfx: NONREACTIVE

## 2017-10-16 LAB — RPR: RPR Ser Ql: NONREACTIVE

## 2017-10-16 LAB — MAGNESIUM: Magnesium: 1.6 mg/dL — ABNORMAL LOW (ref 1.7–2.4)

## 2017-10-16 LAB — VDRL, CSF: VDRL Quant, CSF: NONREACTIVE

## 2017-10-16 MED ORDER — HYDROCODONE-ACETAMINOPHEN 5-325 MG PO TABS
1.0000 | ORAL_TABLET | ORAL | Status: DC | PRN
Start: 2017-10-16 — End: 2017-10-17
  Administered 2017-10-16: 1 via ORAL
  Filled 2017-10-16: qty 1

## 2017-10-16 MED ORDER — POTASSIUM CHLORIDE CRYS ER 20 MEQ PO TBCR
40.0000 meq | EXTENDED_RELEASE_TABLET | Freq: Once | ORAL | Status: AC
Start: 2017-10-16 — End: 2017-10-16
  Administered 2017-10-16: 40 meq via ORAL
  Filled 2017-10-16: qty 2

## 2017-10-16 MED ORDER — MAGNESIUM SULFATE 2 GM/50ML IV SOLN
2.0000 g | Freq: Once | INTRAVENOUS | Status: AC
  Administered 2017-10-16: 2 g via INTRAVENOUS
  Filled 2017-10-16: qty 50

## 2017-10-16 NOTE — Progress Notes (Signed)
Physical Therapy Treatment Patient Details Name: Amanda HarriesLeomia W [name] MRN: [id number] DOB: August 07, 1966 Today's Date: 10/16/2017    History of Present Illness Amanda [name]  is a 52 y.o. female, with history of diabetes mellitus type 2 on oral hypoglycemics, arthritis, fibromyalgia, GERD, hypertension and history of migraines was brought to the ED by family with progressive decline in mental status for past 4 days.  Patient was seen at [facility] 1 week ago with acute gastroenteritis with symptoms of nausea, vomiting and diarrhea and found to have mild AK I.  She got IV fluids in the ED and was discharged home.  Since then family has found her to be less interactive, sleepy most of the time and confused.  She was then taken to the ED 3 days later with complaints of generalized weakness, nausea and some abdominal pain.  She was again given IV fluids, abdominal x-ray unremarkable and given antiemetics for suspected gastroenteritis.  Since then she has been increasingly confused and unable to communicate properly, has been confused even telling her name or calling out for her children.  History provided by patient's son and daughter at bedside who informed that patient was complaining of numbness in the left side of her arm and face, complaint of some chills and was still having off-and-on vomiting and loose stools.    PT Comments    Patient able to ambulate up to 10-12 feet without use of assistive device, but demonstrates unsteady slow cadence with wide base of support and advised to use RW when return home, demonstrates good return for completing exercises, no c/o pain and tolerated staying up in chair after therapy.  Patient will benefit from continued physical therapy in hospital and recommended venue below to increase strength, balance, endurance for safe ADLs and gait.    Follow Up Recommendations  Home health PT;Supervision/Assistance - 24 hour     Equipment Recommendations  Rolling  [name] with 5" wheels    Recommendations for Other Services       Precautions / Restrictions Precautions Precautions: Fall Restrictions Weight Bearing Restrictions: No    Mobility  Bed Mobility Overal bed mobility: Modified Independent Bed Mobility: Supine to Sit;Sit to Supine     Supine to sit: Modified independent (Device/Increase time) Sit to supine: Modified independent (Device/Increase time)   General bed mobility comments: slightly labored and slow  Transfers Overall transfer level: Needs assistance Equipment used: None;Rolling [name] (2 wheeled) Transfers: Sit to/from [employer]Stand;Stand Pivot Transfers Sit to Stand: Supervision Stand pivot transfers: Supervision       General transfer comment: able to sit to stand and transfer without assistive device with supervision  Ambulation/Gait Ambulation/Gait assistance: Min guard Ambulation Distance (Feet): 40 Feet Assistive device: None;Rolling [name] (2 wheeled) Gait Pattern/deviations: Decreased step length - right;Decreased step length - left;Decreased stride length   Gait velocity interpretation: Below normal speed for age/gender General Gait Details: demonstrates slow labored movement with wide base of support x 10 feet without using assistive device, balance improved with RW, no loss of balance, limited secondary to c/o fatigue   Stairs            Wheelchair Mobility    Modified Rankin (Stroke Patients Only)       Balance Overall balance assessment: Needs assistance Sitting-balance support: No upper extremity supported;Feet supported Sitting balance-Leahy Scale: Good     Standing balance support: No upper extremity supported;During functional activity Standing balance-Leahy Scale: Fair Standing balance comment: fair/good using RW  Cognition Arousal/Alertness: Awake/alert Behavior During Therapy: WFL for tasks assessed/performed Overall Cognitive Status: Within  Functional Limits for tasks assessed                                        Exercises General Exercises - Upper Extremity Shoulder Flexion: AROM;5 reps Shoulder ABduction: AROM;5 reps Digit Composite Flexion: AROM;5 reps Composite Extension: AROM;5 reps General Exercises - Lower Extremity Long Arc Quad: Seated;AROM;Strengthening;Both;10 reps Hip Flexion/Marching: Seated;AROM;Strengthening;Both;10 reps Toe Raises: Seated;AROM;Strengthening;Both;10 reps Heel Raises: Seated;AROM;Strengthening;Both;10 reps Other Exercises Other Exercises: overhead press, LUE, A/ROM 5X Other Exercises: shoulder protraction, A/ROM, 5X    General Comments        Pertinent Vitals/Pain Pain Assessment: No/denies pain    Home Living                      Prior Function            PT Goals (current goals can now be found in the care plan section) Acute Rehab PT Goals Patient Stated Goal: return home with family to assist PT Goal Formulation: With patient/family Time For Goal Achievement: 10/28/17 Potential to Achieve Goals: Good Progress towards PT goals: Progressing toward goals    Frequency    Min 3X/week      PT Plan Current plan remains appropriate    Co-evaluation              AM-PAC PT "6 Clicks" Daily Activity  Outcome Measure  Difficulty turning over in bed (including adjusting bedclothes, sheets and blankets)?: None Difficulty moving from lying on back to sitting on the side of the bed? : None Difficulty sitting down on and standing up from a chair with arms (e.g., wheelchair, bedside commode, etc,.)?: None Help needed moving to and from a bed to chair (including a wheelchair)?: None Help needed walking in hospital room?: A Little Help needed climbing 3-5 steps with a railing? : A Little 6 Click Score: 22    End of Session   Activity Tolerance: Patient tolerated treatment well;Patient limited by fatigue Patient left: in chair;with call  bell/phone within reach Nurse Communication: Mobility status PT Visit Diagnosis: Unsteadiness on feet (R26.81);Other abnormalities of gait and mobility (R26.89);Muscle weakness (generalized) (M62.81)     Time: 1610-9604 PT Time Calculation (min) (ACUTE ONLY): 25 min  Charges:  $Therapeutic Activity: 23-37 mins                    G Codes:       11:18 AM, 10/20/17 Ocie Bob, MPT Physical Therapist with [facility] 336 [phone] office [phone] mobile phone

## 2017-10-16 NOTE — Progress Notes (Signed)
Pt performing exercises provided by PT at bedside. Tolerating well. Will continue to monitor.

## 2017-10-16 NOTE — Care Management Note (Addendum)
Case Management Note  Patient Details  Name: Amanda [name] MRN: [id number] Date of Birth: 08-Jan-1966  Subjective/Objective:   Adm with acute encephalopathy.From home. Ind with ADL's.  ? Of viral illness as LP was unrevealing. [facility]/24 hour supervision and RW/ shower seat.    Action/Plan: Patient plans to stay with her daughter after discharged. Offered choice of HHA and DME agency. No preference. [facility]Kathy [facility] notified and will obtain orders from Epic and deliver DME to room.  ADDENDUM 10/17/2017: Patient will be living at address [address], [place], [place] with her daughter at time of DC.  Expected Discharge Date:    10/17/2017              Expected Discharge Plan:  Home w Home Health Services  In-House Referral:     Discharge planning Services  CM Consult  Post Acute Care Choice:  Home Health, Durable Medical Equipment Choice offered to:  Patient  DME Arranged:  [name] rolling, shower seat DME Agency:  [facility]  [facility]   [facility]  [facility]  Status of Service:  Completed, signed off  If discussed at Long Length of Stay Meetings, dates discussed:    Additional Comments:  Danissa Rundle, Chrystine OilerSharley Diane, RN 10/16/2017, 11:08 AM

## 2017-10-16 NOTE — Progress Notes (Signed)
PROGRESS NOTE    Amanda [name]  ZOX:[id number]  DOB: 24-Dec-1965  DOA: 10/14/2017 PCP: Margaretann Loveless, MD   Brief Admission Hx: 52 y.o. female, with history of diabetes mellitus type 2 on oral hypoglycemics, arthritis, fibromyalgia, GERD, hypertension and history of migraines was brought to the ED by family with progressive decline in mental status for past 4 days.  Head CT negative for acute findings.  Patient has multiple piercing in her shoulder and back and could not get an MRI.  MDM/Assessment & Plan:   1. Acute/subacute encephalopathy -patient slowly improving. LP was unrevealing.  Neurology suggesting acute viral illness led to condition.    CSF cultures and studies pending.  2. Type 2 diabetes mellitus-monitor and treat with sliding scale coverage. 3. Hypokalemia-repleting today.  Giving magnesium as well.  4. Hypomagnesemia - IV replacement ordered, recheck in AM.  5. Nausea vomiting/diarrhea- improving with supportive care. 6. Essential hypertension- hydralazine ordered.  Resume home metoprolol.  DVT prophylaxis: SCD Code Status: Full Family Communication: Daughter at bedside Disposition Plan: Home tomorrow if stable.   Consultants:  Neurology  Interventional radiology  Procedures:  Lumbar puncture 2/13 pending  Subjective: Patient remains confused but is slightly improved per family.  Objective: Vitals:   10/15/17 1515 10/15/17 2213 10/16/17 0200 10/16/17 0600  BP: (!) 146/90 130/78 (!) 150/71 (!) 143/81  Pulse: (!) 114 (!) 108 88 85  Resp: 18 18 17 16   Temp:  98.2 F (36.8 C) 98.4 F (36.9 C) 98.1 F (36.7 C)  TempSrc:  Oral Oral Oral  SpO2: 99% 99% 98% 99%  Weight:      Height:       No intake or output data in the 24 hours ending 10/16/17 0909 Filed Weights   10/14/17 0545 10/15/17 0647  Weight: 91.2 kg (201 lb) 91.2 kg (200 lb 15.9 oz)   REVIEW OF SYSTEMS  As per history otherwise all reviewed and reported negative  Exam:  General  exam: awake, alert, sitting up, NAD, cooperative.  Respiratory system: Clear. No increased work of breathing. Cardiovascular system: S1 & S2 heard, RRR. No JVD, murmurs, gallops, clicks or pedal edema. Gastrointestinal system: Abdomen is nondistended, soft and nontender. Normal bowel sounds heard. Central nervous system: Alert and oriented. No focal neurological deficits. Extremities: no CCE.  Data Reviewed: Basic Metabolic Panel: Recent Labs  Lab 10/10/17 1028 10/14/17 0545 10/16/17 0400 10/16/17 0408  NA 144 145  --  143  K 3.3* 3.4*  --  3.3*  CL 105 110  --  112*  CO2 25 22  --  22  GLUCOSE 123* 142*  --  151*  BUN 11 18  --  7  CREATININE 1.00 0.99  --  0.80  CALCIUM 10.5* 9.9  --  9.1  MG  --   --  1.6*  --    Liver Function Tests: Recent Labs  Lab 10/14/17 0545 10/16/17 0408  AST 47* 23  ALT 51 37  ALKPHOS 125 103  BILITOT 0.1* 0.3  PROT 8.4* 6.9  ALBUMIN 4.5 3.7   No results for input(s): LIPASE, AMYLASE in the last 168 hours. Recent Labs  Lab 10/14/17 0545  AMMONIA 20   CBC: Recent Labs  Lab 10/10/17 1028 10/14/17 0545 10/16/17 0408  WBC 4.6 5.6 5.1  NEUTROABS 2.4  --  2.8  HGB 14.2 14.7 12.8  HCT 43.3 44.5 39.8  MCV 90.0 90.6 89.8  PLT 246 278 255   Cardiac Enzymes: No results for  input(s): CKTOTAL, CKMB, CKMBINDEX, TROPONINI in the last 168 hours. CBG (last 3)  Recent Labs    10/15/17 1709 10/15/17 2212 10/16/17 0820  GLUCAP 146* 173* 153*   Recent Results (from the past 240 hour(s))  Culture, blood (routine x 2)     Status: None (Preliminary result)   Collection Time: 10/14/17 11:58 AM  Result Value Ref Range Status   Specimen Description BLOOD LEFT ANTECUBITAL  Final   Special Requests   Final    BOTTLES DRAWN AEROBIC AND ANAEROBIC Blood Culture adequate volume   Culture   Final    NO GROWTH 2 DAYS Performed at [facility], [address] [place], [place] [address]    Report Status PENDING  Incomplete  Culture, blood (routine  x 2)     Status: None (Preliminary result)   Collection Time: 10/14/17 11:58 AM  Result Value Ref Range Status   Specimen Description BLOOD LEFT ARM  Final   Special Requests   Final    BOTTLES DRAWN AEROBIC ONLY Blood Culture results may not be optimal due to an inadequate volume of blood received in culture bottles   Culture   Final    NO GROWTH 2 DAYS Performed at [facility], [address] [place], [place] [address]    Report Status PENDING  Incomplete  CSF culture     Status: None (Preliminary result)   Collection Time: 10/15/17  2:14 PM  Result Value Ref Range Status   Specimen Description CSF  Final   Special Requests NONE  Final   Gram Stain   Final    NO ORGANISMS SEEN RARE WBC PRESENT, PREDOMINANTLY MONONUCLEAR Performed at [facility], [address] [place], [place] [address]    Culture PENDING  Incomplete   Report Status PENDING  Incomplete     Studies: Ct Angio Head W Or Wo Contrast  Result Date: 10/14/2017 CLINICAL DATA:  Altered mental status and ataxia. EXAM: CT ANGIOGRAPHY HEAD AND NECK TECHNIQUE: Multidetector CT imaging of the head and neck was performed using the standard protocol during bolus administration of intravenous contrast. Multiplanar CT image reconstructions and MIPs were obtained to evaluate the vascular anatomy. Carotid stenosis measurements (when applicable) are obtained utilizing NASCET criteria, using the distal internal carotid diameter as the denominator. CONTRAST:  80mL ISOVUE-370 IOPAMIDOL (ISOVUE-370) INJECTION 76% COMPARISON:  Head CT 10/14/2017 FINDINGS: CTA NECK FINDINGS Aortic arch: There is no calcific atherosclerosis of the aortic arch. There is no aneurysm, dissection or hemodynamically significant stenosis of the visualized ascending aorta and aortic arch. Normal variant aortic arch branching pattern with the brachiocephalic and left common carotid arteries sharing a common origin. The visualized proximal subclavian arteries are  normal. Right carotid system: The right common carotid origin is widely patent. There is no common carotid or internal carotid artery dissection or aneurysm. No hemodynamically significant stenosis. Left carotid system: The left common carotid origin is widely patent. There is no common carotid or internal carotid artery dissection or aneurysm. No hemodynamically significant stenosis. Vertebral arteries: The vertebral system is left dominant. Both vertebral artery origins are normal. Both vertebral arteries are normal to their confluence with the basilar artery. Skeleton: There is no bony spinal canal stenosis. No lytic or blastic lesions. Mild cervical degenerative disc disease. Other neck: The nasopharynx is clear. The oropharynx and hypopharynx are normal. The epiglottis is normal. The supraglottic larynx, glottis and subglottic larynx are normal. No retropharyngeal collection. The parapharyngeal spaces are preserved. The parotid and submandibular glands are  normal. No sialolithiasis or salivary ductal dilatation. The thyroid gland is normal. There is no cervical lymphadenopathy. Upper chest: No pneumothorax or pleural effusion. No nodules or masses. Review of the MIP images confirms the above findings CTA HEAD FINDINGS Anterior circulation: --Intracranial internal carotid arteries: Normal. --Anterior cerebral arteries: Normal. --Middle cerebral arteries: Normal. --Posterior communicating arteries: Present bilaterally. Posterior circulation: --Posterior cerebral arteries: Normal. --Superior cerebellar arteries: Normal. --Basilar artery: Normal. --Anterior inferior cerebellar arteries: Normal. --Posterior inferior cerebellar arteries: Normal. Venous sinuses: As permitted by contrast timing, patent. Anatomic variants: None Delayed phase: No parenchymal contrast enhancement. Review of the MIP images confirms the above findings. IMPRESSION: Normal CTA of the head and neck. Electronically Signed   By: Deatra Robinson  M.D.   On: 10/14/2017 13:36   Ct Angio Neck W Or Wo Contrast  Result Date: 10/14/2017 CLINICAL DATA:  Altered mental status and ataxia. EXAM: CT ANGIOGRAPHY HEAD AND NECK TECHNIQUE: Multidetector CT imaging of the head and neck was performed using the standard protocol during bolus administration of intravenous contrast. Multiplanar CT image reconstructions and MIPs were obtained to evaluate the vascular anatomy. Carotid stenosis measurements (when applicable) are obtained utilizing NASCET criteria, using the distal internal carotid diameter as the denominator. CONTRAST:  80mL ISOVUE-370 IOPAMIDOL (ISOVUE-370) INJECTION 76% COMPARISON:  Head CT 10/14/2017 FINDINGS: CTA NECK FINDINGS Aortic arch: There is no calcific atherosclerosis of the aortic arch. There is no aneurysm, dissection or hemodynamically significant stenosis of the visualized ascending aorta and aortic arch. Normal variant aortic arch branching pattern with the brachiocephalic and left common carotid arteries sharing a common origin. The visualized proximal subclavian arteries are normal. Right carotid system: The right common carotid origin is widely patent. There is no common carotid or internal carotid artery dissection or aneurysm. No hemodynamically significant stenosis. Left carotid system: The left common carotid origin is widely patent. There is no common carotid or internal carotid artery dissection or aneurysm. No hemodynamically significant stenosis. Vertebral arteries: The vertebral system is left dominant. Both vertebral artery origins are normal. Both vertebral arteries are normal to their confluence with the basilar artery. Skeleton: There is no bony spinal canal stenosis. No lytic or blastic lesions. Mild cervical degenerative disc disease. Other neck: The nasopharynx is clear. The oropharynx and hypopharynx are normal. The epiglottis is normal. The supraglottic larynx, glottis and subglottic larynx are normal. No retropharyngeal  collection. The parapharyngeal spaces are preserved. The parotid and submandibular glands are normal. No sialolithiasis or salivary ductal dilatation. The thyroid gland is normal. There is no cervical lymphadenopathy. Upper chest: No pneumothorax or pleural effusion. No nodules or masses. Review of the MIP images confirms the above findings CTA HEAD FINDINGS Anterior circulation: --Intracranial internal carotid arteries: Normal. --Anterior cerebral arteries: Normal. --Middle cerebral arteries: Normal. --Posterior communicating arteries: Present bilaterally. Posterior circulation: --Posterior cerebral arteries: Normal. --Superior cerebellar arteries: Normal. --Basilar artery: Normal. --Anterior inferior cerebellar arteries: Normal. --Posterior inferior cerebellar arteries: Normal. Venous sinuses: As permitted by contrast timing, patent. Anatomic variants: None Delayed phase: No parenchymal contrast enhancement. Review of the MIP images confirms the above findings. IMPRESSION: Normal CTA of the head and neck. Electronically Signed   By: Deatra Robinson M.D.   On: 10/14/2017 13:36   Dg Fluoro Guided Needle Plc Aspiration/injection Loc  Result Date: 10/15/2017 CLINICAL DATA:  Vertigo, confusion, altered mental status EXAM: DIAGNOSTIC LUMBAR PUNCTURE UNDER FLUOROSCOPIC GUIDANCE FLUOROSCOPY TIME:  Fluoroscopy Time:  0 minutes 24 seconds Radiation Exposure Index (if provided by the fluoroscopic device): 9.0 mGy  Number of Acquired Spot Images: 2 digital screen capture during fluoroscopy PROCEDURE: Procedure, benefits, and risks were discussed with the patient, including alternatives. Patient's questions were answered. Written informed consent was obtained. Timeout protocol followed. Patient placed prone. L4-L5 disc space was localized under fluoroscopy. Skin prepped and draped in usual sterile fashion. Skin and soft tissues anesthetized with 2 mL of 1% lidocaine. 22 gauge needle was advanced into the spinal canal where  clear colorless CSF was encountered. 10 of CSF was obtained in 4 tubes for requested analysis. Procedure tolerated very well by patient without immediate complication. IMPRESSION: Successful fluoroscopic guided lumbar puncture as above. Electronically Signed   By: Ulyses Southward M.D.   On: 10/15/2017 14:41   Scheduled Meds: . aspirin  300 mg Rectal Daily   Or  . aspirin  325 mg Oral Daily  . insulin aspart  0-15 Units Subcutaneous TID WC  . insulin aspart  0-5 Units Subcutaneous QHS  . metoprolol succinate  50 mg Oral Daily  . pantoprazole (PROTONIX) IV  40 mg Intravenous Q24H   Continuous Infusions: . 0.9 % NaCl with KCl 20 mEq / L 75 mL/hr at 10/16/17 0724  . magnesium sulfate 1 - 4 g bolus IVPB      Principal Problem:   Acute encephalopathy Active Problems:   Depression   Diabetes mellitus, type 2 (HCC)   Esophageal reflux   Nausea and vomiting   Elevated blood pressure reading   Hypertension   Wears dentures  Time spent:   Standley Dakins, MD, FAAFP [facility] Pager [phone]  If 7PM-7AM, please contact night-coverage [email or web address] Password [email or web address] 10/16/2017, 9:09 AM    LOS: 1 day

## 2017-10-16 NOTE — Care Management Important Message (Signed)
Important Message  Patient Details  Name: Amanda [name] MRN: [id number] Date of Birth: 1966/07/04   Medicare Important Message Given:  Yes    Rahmon Heigl, Chrystine OilerSharley Diane, RN 10/16/2017, 11:17 AM

## 2017-10-16 NOTE — Plan of Care (Signed)
  Education: Knowledge of General Education information will improve 10/16/2017 0029 - Not Progressing by Wynne Dusthomas, Swayze Pries, RN   Activity: Risk for activity intolerance will decrease 10/16/2017 0029 - Not Progressing by Wynne Dusthomas, Averee Harb, RN   Coping: Level of anxiety will decrease 10/16/2017 0029 - Not Progressing by Wynne Dusthomas, Vanderbilt Ranieri, RN   Pain Managment: General experience of comfort will improve 10/16/2017 0029 - Progressing by Wynne Dusthomas, Thekla Colborn, RN   Safety: Ability to remain free from injury will improve 10/16/2017 0029 - Progressing by Wynne Dusthomas, Rheannon Cerney, RN   Education: Knowledge of disease or condition will improve 10/16/2017 0029 - Not Progressing by Wynne Dusthomas, Massiel Stipp, RN Knowledge of secondary prevention will improve 10/16/2017 0029 - Not Progressing by Wynne Dusthomas, Hayzlee Mcsorley, RN   Education: Knowledge of disease or condition will improve 10/16/2017 0029 - Not Progressing by Wynne Dusthomas, Debanhi Blaker, RN Knowledge of secondary prevention will improve 10/16/2017 0029 - Not Progressing by Wynne Dusthomas, Carlos Quackenbush, RN

## 2017-10-16 NOTE — Progress Notes (Signed)
Occupational Therapy Treatment Patient Details Name: Amanda [name] MRN: [id number] DOB: 01-22-1966 Today's Date: 10/16/2017    History of present illness Amanda [name]  is a 52 y.o. female, with history of diabetes mellitus type 2 on oral hypoglycemics, arthritis, fibromyalgia, GERD, hypertension and history of migraines was brought to the ED by family with progressive decline in mental status for past 4 days.  Patient was [facility] 1 week ago with acute gastroenteritis with symptoms of nausea, vomiting and diarrhea and found to have mild AK I.  She got IV fluids in the ED and was discharged home.  Since then family has found her to be less interactive, sleepy most of the time and confused.  She was then taken to the ED 3 days later with complaints of generalized weakness, nausea and some abdominal pain.  She was again given IV fluids, abdominal x-ray unremarkable and given antiemetics for suspected gastroenteritis.  Since then she has been increasingly confused and unable to communicate properly, has been confused even telling her name or calling out for her children.  History provided by patient's son and daughter at bedside who informed that patient was complaining of numbness in the left side of her arm and face, complaint of some chills and was still having off-and-on vomiting and loose stools.   OT comments  Pt progressing towards OT goals, improving to supervision level with standing ADLs and transfers. Pt did require assist with donning gown backwards as a robe and threading LUE through armhole. Improved activity tolerance today during functional mobility and standing tasks. [facility]    Follow Up Recommendations  Home health OT;Supervision/Assistance - 24 hour    Equipment Recommendations  Tub/shower seat       Precautions / Restrictions Precautions Precautions: Fall Restrictions Weight Bearing Restrictions: No        Mobility Bed Mobility Overal bed mobility: Needs Assistance Bed Mobility: Supine to Sit     Supine to sit: Supervision;HOB elevated        Transfers Overall transfer level: Needs assistance Equipment used: Rolling [name] (2 wheeled) Transfers: Sit to/from [employer]Stand;Stand Pivot Transfers Sit to Stand: Min guard Stand pivot transfers: Supervision                ADL either performed or assessed with clinical judgement   ADL Overall ADL's : Needs assistance/impaired     Grooming: Wash/dry hands;Supervision/safety;Standing           Upper Body Dressing : Minimal assistance;Sitting Upper Body Dressing Details (indicate cue type and reason): Min assist for donning gown as robe and threading LUE                 Functional mobility during ADLs: Supervision/safety;Rolling [name]                 Cognition Arousal/Alertness: Awake/alert Behavior During Therapy: WFL for tasks assessed/performed Overall Cognitive Status: Within Functional Limits for tasks assessed                                          Exercises Exercises: General Upper Extremity;Other exercises General Exercises - Upper Extremity Shoulder Flexion: AROM;5 reps Shoulder ABduction: AROM;5 reps Digit Composite Flexion: AROM;5 reps Composite Extension: AROM;5 reps Other Exercises Other Exercises: overhead press, LUE, A/ROM 5X Other Exercises: shoulder protraction, A/ROM, 5X  Pertinent Vitals/ Pain       Pain Assessment: No/denies pain         Frequency  Min 2X/week        Progress Toward Goals  OT Goals(current goals can now be found in the care plan section)  Progress towards OT goals: Progressing toward goals  Acute Rehab OT Goals Patient Stated Goal: return home with family to assist OT Goal Formulation: With patient Time For Goal Achievement: 10/29/17 Potential to Achieve Goals: Good ADL Goals Pt Will Perform Grooming: with modified  independence;standing Pt Will Perform Lower Body Dressing: with modified independence;with supervision;sit to/from stand Pt Will Transfer to Toilet: with modified independence;ambulating;regular height toilet;grab bars Pt Will Perform Toileting - Clothing Manipulation and hygiene: with modified independence;sitting/lateral leans;sit to/from stand Pt/caregiver will Perform Home Exercise Program: Increased strength;Left upper extremity;Independently;With Supervision;With written HEP provided  Plan Discharge plan remains appropriate          End of Session Equipment Utilized During Treatment: Gait belt;Rolling [name]  OT Visit Diagnosis: Muscle weakness (generalized) (M62.81)   Activity Tolerance Patient limited by fatigue   Patient Left in chair;with call bell/phone within reach;with chair alarm set   Nurse Communication          Time: [phone] OT Time Calculation (min): 38 min  Charges: OT General Charges $OT Visit: 1 Visit OT Treatments $Self Care/Home Management : 38-52 mins    Ezra Sites, OTR/L  [phone] 10/16/2017, 8:18 AM

## 2017-10-17 LAB — GLUCOSE, CAPILLARY: Glucose-Capillary: 165 mg/dL — ABNORMAL HIGH (ref 65–99)

## 2017-10-17 MED ORDER — PRAVASTATIN SODIUM 80 MG PO TABS: 80.0000 mg | ORAL_TABLET | Freq: Every day | ORAL | 0 refills | Status: DC

## 2017-10-17 MED ORDER — ISOSORBIDE MONONITRATE ER 30 MG PO TB24: 30.0000 mg | ORAL_TABLET | Freq: Every day | ORAL | Status: DC

## 2017-10-17 MED ORDER — ASPIRIN EC 81 MG PO TBEC: 81.0000 mg | DELAYED_RELEASE_TABLET | Freq: Every day | ORAL | Status: DC

## 2017-10-17 NOTE — Progress Notes (Signed)
Physical Therapy Treatment Patient Details Name: Amanda HarriesLeomia W [name] MRN: [id number] DOB: 1965-12-29 Today's Date: 10/17/2017    History of Present Illness Amanda [name]  is a 52 y.o. female, with history of diabetes mellitus type 2 on oral hypoglycemics, arthritis, fibromyalgia, GERD, hypertension and history of migraines was brought to the ED by family with progressive decline in mental status for past 4 days.  Patient was seen at [facility] 1 week ago with acute gastroenteritis with symptoms of nausea, vomiting and diarrhea and found to have mild AK I.  She got IV fluids in the ED and was discharged home.  Since then family has found her to be less interactive, sleepy most of the time and confused.  She was then taken to the ED 3 days later with complaints of generalized weakness, nausea and some abdominal pain.  She was again given IV fluids, abdominal x-ray unremarkable and given antiemetics for suspected gastroenteritis.  Since then she has been increasingly confused and unable to communicate properly, has been confused even telling her name or calling out for her children.  History provided by patient's son and daughter at bedside who informed that patient was complaining of numbness in the left side of her arm and face, complaint of some chills and was still having off-and-on vomiting and loose stools.    PT Comments    Patient able to ambulate 150 feet with RW and supervision but and continues to demonstrate slow cadence with wide base of support. Education provided for stair training in preparation of safe discharge to be abel to enter house with 3 stairs. She demonstrated good side step pattern to ascend/descend 2 steps. Patient will benefit from continued physical therapy in hospital and recommended venue below to increase strength, balance, endurance for safe ADLs and gait.    Follow Up Recommendations  Home health PT;Supervision/Assistance - 24 hour     Equipment  Recommendations  Rolling [name] with 5" wheels    Recommendations for Other Services       Precautions / Restrictions Precautions Precautions: Fall Restrictions Weight Bearing Restrictions: No    Mobility  Bed Mobility Overal bed mobility: Modified Independent Bed Mobility: Supine to Sit;Sit to Supine     Supine to sit: Modified independent (Device/Increase time) Sit to supine: Modified independent (Device/Increase time)   General bed mobility comments: slightly labored and slow  Transfers Overall transfer level: Needs assistance Equipment used: None;Rolling [name] (2 wheeled) Transfers: Sit to/from [employer]Stand;Stand Pivot Transfers Sit to Stand: Supervision Stand pivot transfers: Supervision       General transfer comment: able to sit to stand and transfer without assistive device with supervision  Ambulation/Gait Ambulation/Gait assistance: Min guard Ambulation Distance (Feet): 150 Feet Assistive device: None;Rolling [name] (2 wheeled) Gait Pattern/deviations: Decreased step length - right;Decreased step length - left;Decreased stride length   Gait velocity interpretation: Below normal speed for age/gender General Gait Details: demonstrates slow labored movement with wide base of support, no overt loss of balance   Stairs Stairs: Yes   Stair Management: One rail Right;Sideways Number of Stairs: 2 General stair comments: 2x 1 step to ascend/descend stiar with sideways pattern using bil UE support on one hand rail; patient demosntrated good form following demonstration   Wheelchair Mobility    Modified Rankin (Stroke Patients Only)       Balance Overall balance assessment: Needs assistance Sitting-balance support: No upper extremity supported;Feet supported Sitting balance-Leahy Scale: Good     Standing balance support: During functional activity;Bilateral upper extremity  supported Standing balance-Leahy Scale: Good Standing balance comment: good using RW      Cognition Arousal/Alertness: Awake/alert Behavior During Therapy: WFL for tasks assessed/performed Overall Cognitive Status: Within Functional Limits for tasks assessed       Exercises General Exercises - Lower Extremity Long Arc Quad: Seated;AROM;Strengthening;Both;10 reps Hip Flexion/Marching: 10 reps;Both;Seated;AROM    General Comments        Pertinent Vitals/Pain Pain Assessment: No/denies pain           PT Goals (current goals can now be found in the care plan section) Acute Rehab PT Goals Patient Stated Goal: return home with family to assist PT Goal Formulation: With patient/family Time For Goal Achievement: 10/28/17 Potential to Achieve Goals: Good Progress towards PT goals: Progressing toward goals    Frequency    Min 3X/week      PT Plan Current plan remains appropriate       AM-PAC PT "6 Clicks" Daily Activity  Outcome Measure  Difficulty turning over in bed (including adjusting bedclothes, sheets and blankets)?: None Difficulty moving from lying on back to sitting on the side of the bed? : None Difficulty sitting down on and standing up from a chair with arms (e.g., wheelchair, bedside commode, etc,.)?: None Help needed moving to and from a bed to chair (including a wheelchair)?: None Help needed walking in hospital room?: A Little Help needed climbing 3-5 steps with a railing? : A Little 6 Click Score: 22    End of Session Equipment Utilized During Treatment: Gait belt Activity Tolerance: Patient tolerated treatment well;Patient limited by fatigue Patient left: with call bell/phone within reach;in chair;with family/visitor present Nurse Communication: Mobility status PT Visit Diagnosis: Unsteadiness on feet (R26.81);Other abnormalities of gait and mobility (R26.89);Muscle weakness (generalized) (M62.81)     Time: 0981-1914 PT Time Calculation (min) (ACUTE ONLY): 36 min  Charges:  $Gait Training: 8-22 mins $Therapeutic Exercise: 8-22  mins                    G Codes:       Valentino Saxon, PT, DPT Physical Therapist with [facility]  10/17/2017 11:51 AM

## 2017-10-17 NOTE — Progress Notes (Signed)
[place] A. Merlene Laughter, MD     [email or web address]          Amanda [name] is an 52 y.o. female.   ASSESSMENT/PLAN:  Unexplained encephalopathy that has occurred in the setting of what appears to be a viral illness. I suspect that the viral illness is the explanation for her symptoms. No CNS infections or acute intracranial processes uncovered.      The patient reports that she is doing a lot better. She indicates that she has not had diarrhea today and abdominal pain is significantly improved. Her thinking and cognition seem to be better per her description and clinically. She tells me she was to be discharged home tomorrow.  GENERAL: She appears to be somewhat weak but in no acute distress.  HEENT: Neck is supple.  ABDOMEN: soft  EXTREMITIES: No edema   BACK: Normal  SKIN: Normal by inspection.    MENTAL STATUS: Alert and oriented. Speech, language and cognition are generally intact. Judgment and insight normal.   CRANIAL NERVES: Pupils are equal, round and reactive to light and accomodation; extra ocular movements are full, there is no significant nystagmus; visual fields are full; upper and lower facial muscles are normal in strength and symmetric, there is no flattening of the nasolabial folds; tongue is midline; uvula is midline; shoulder elevation is normal.  MOTOR: Left upper and left lower extremities graded as 4/5. Right upper extremity 4+/5 and right leg 5. Bulk and tone are normal throughout.  COORDINATION: Left finger to nose is normal, right finger to nose is normal, No rest tremor; no intention tremor; no postural tremor; no bradykinesia.  REFLEXES: Deep tendon reflexes are symmetrical and normal. Plantar reflexes are flexor bilaterally.   SENSATION: No clear deficit to temperature light touch.        Blood pressure (!) 162/110, pulse 89, temperature 98.4 F (36.9 C), temperature source Oral, resp. rate 18, height 5' 5"  (1.651 m),  weight 202 lb 2.6 oz (91.7 kg), SpO2 97 %.  Past Medical History:  Diagnosis Date  . Arthritis    knees, shoulder, upper back  . Asthma   . Diabetes mellitus without complication ([place])   . Environmental allergies   . Fibromyalgia   . GERD (gastroesophageal reflux disease)   . Headache    migraines - 5x/mo  . Hypertension   . Hypokalemia 10/14/2017  . Migraines   . Motion sickness    ships  . Sleep apnea   . Thyroid nodule    bilateral and goiter  . Wears contact lenses   . Wears dentures    partial upper    Past Surgical History:  Procedure Laterality Date  . ABDOMINAL HYSTERECTOMY  2000's  . ABDOMINAL SURGERY     laparoscopy x4 with lysis of adhesions  . APPENDECTOMY  1986  . CESAREAN SECTION  1992  . CHOLECYSTECTOMY N/A 04/03/2017   Procedure: LAPAROSCOPIC CHOLECYSTECTOMY;  Surgeon: Olean Ree, MD;  Location: [facility] ORS;  Service: General;  Laterality: N/A;  . COLONOSCOPY WITH PROPOFOL N/A 03/10/2017   Procedure: COLONOSCOPY WITH PROPOFOL;  Surgeon: Manya Silvas, MD;  Location: [facility] ENDOSCOPY;  Service: Endoscopy;  Laterality: N/A;  . ECTOPIC PREGNANCY SURGERY  2000's  . ESOPHAGOGASTRODUODENOSCOPY (EGD) WITH PROPOFOL N/A 03/10/2017   Procedure: ESOPHAGOGASTRODUODENOSCOPY (EGD) WITH PROPOFOL;  Surgeon: Manya Silvas, MD;  Location: [facility] ENDOSCOPY;  Service: Endoscopy;  Laterality: N/A;  . HERNIA REPAIR    . JOINT REPLACEMENT Right 12/16/12   knee- medial - makoplasty  .  KNEE ARTHROSCOPY Right 2006   partial medial and lateral meniscectomies  . KNEE ARTHROSCOPY Right 10/06/2015   Procedure: RIGHT KNEE ARTHROSCOPY WITH DEBRIDEMENT;  Surgeon: Leanor Kail, MD;  Location: [place];  Service: Orthopedics;  Laterality: Right;  Diabetic - insulin and oral meds  . ROTATOR CUFF REPAIR Left 2006  . TUBAL LIGATION  2000's    Family History  Problem Relation Age of Onset  . Hypertension Mother   . Hypertension Father   . Diabetes Father   . Allergies Father      Social History:  reports that  has never smoked. she has never used smokeless tobacco. She reports that she does not drink alcohol or use drugs.  Allergies:  Allergies  Allergen Reactions  . Penicillins Anaphylaxis    Has patient had a PCN reaction causing immediate rash, facial/tongue/throat swelling, SOB or lightheadedness with hypotension: Yes Has patient had a PCN reaction causing severe rash involving mucus membranes or skin necrosis: No Has patient had a PCN reaction that required hospitalization No Has patient had a PCN reaction occurring within the last 10 years: No If all of the above answers are "NO", then may proceed with Cephalosporin use.   . Ace Inhibitors Swelling    Medications: Prior to Admission medications   Medication Sig Start Date End Date Taking? Authorizing Provider  albuterol (PROVENTIL HFA) 108 (90 Base) MCG/ACT inhaler Inhale 2 puffs into the lungs every 4 (four) hours as needed for wheezing or shortness of breath. 04/16/17  Yes Laverle Hobby, MD  Ascorbic Acid (VITAMIN C) 1000 MG tablet Take 1,000 mg by mouth daily.   Yes [provider]  BREO ELLIPTA 200-25 MCG/INH AEPB Inhale 1 puff into the lungs daily. 09/26/17  Yes Laverle Hobby, MD  canagliflozin (INVOKANA) 300 MG TABS tablet Take 300 mg by mouth daily before breakfast.   Yes [provider]  flunisolide (NASALIDE) 25 MCG/ACT (0.025%) SOLN Place 2 sprays into the nose daily as needed.   Yes [provider]  insulin detemir (LEVEMIR) 100 UNIT/ML injection Inject 10 Units into the skin at bedtime.   Yes [provider]  ipratropium-albuterol (DUONEB) 0.5-2.5 (3) MG/3ML SOLN Take 3 mLs by nebulization every 4 (four) hours as needed.   Yes [provider]  isosorbide mononitrate (IMDUR) 30 MG 24 hr tablet Take 0.5 tablets (15 mg total) by mouth daily. Patient taking differently: Take 30 mg by mouth daily.  06/06/17  Yes Epifanio Lesches, MD    LINZESS 290 MCG CAPS capsule Take 1 tablet by mouth daily. 02/28/17  Yes [provider]  metFORMIN (GLUCOPHAGE) 1000 MG tablet Take 1,000 mg by mouth 2 (two) times daily with a meal.    Yes [provider]  metoprolol succinate (TOPROL-XL) 50 MG 24 hr tablet Take 50 mg by mouth daily. Take with or immediately following a meal.   Yes [provider]  montelukast (SINGULAIR) 10 MG tablet Take 1 tablet (10 mg total) by mouth daily. 06/30/17 06/30/18 Yes Laverle Hobby, MD  Multiple Vitamin (MULTIVITAMIN) tablet Take 1 tablet by mouth daily.   Yes [provider]  omeprazole (PRILOSEC) 40 MG capsule Take 1 capsule (40 mg total) by mouth daily. 1 hr before supper 04/05/17  Yes Piscoya, Jose, MD  polyethylene glycol (MIRALAX / GLYCOLAX) packet Take 17 g by mouth daily. Mix one tablespoon with 8oz of your favorite juice or water every day until you are having soft formed stools. Then start taking once daily if  you didn't have a stool the day before. 04/17/17  Yes Merlyn Lot, MD  pravastatin (PRAVACHOL) 40 MG tablet Take 40 mg by mouth daily.   Yes [provider]  promethazine (PHENERGAN) 25 MG tablet Take 1 tablet (25 mg total) by mouth every 6 (six) hours as needed for nausea or vomiting. 10/10/17  Yes Lavonia Drafts, MD  protein supplement shake (PREMIER PROTEIN) LIQD Take 325 mLs (11 oz total) by mouth 3 (three) times daily between meals. 04/05/17  Yes Piscoya, Jose, MD  sucralfate (CARAFATE) 1 g tablet Take 1 g by mouth 2 (two) times daily.    Yes [provider]  SUMAtriptan (IMITREX) 25 MG tablet Take 25 mg by mouth every 2 (two) hours as needed for migraine. May repeat in 2 hours if headache persists or recurs.   Yes [provider]  tiZANidine (ZANAFLEX) 4 MG tablet Take 4 mg by mouth every 6 (six) hours as needed for muscle spasms.   Yes [provider]  zolpidem (AMBIEN) 5 MG tablet Take 5 mg by mouth at bedtime.    Yes  [provider]    Scheduled Meds:  Continuous Infusions:  PRN Meds:.     Results for orders placed or performed during the hospital encounter of 10/14/17 (from the past 48 hour(s))  Glucose, capillary     Status: Abnormal   Collection Time: 10/15/17  5:09 PM  Result Value Ref Range   Glucose-Capillary 146 (H) 65 - 99 mg/dL   Comment 1 Notify RN    Comment 2 Document in Chart   Glucose, capillary     Status: Abnormal   Collection Time: 10/15/17 10:12 PM  Result Value Ref Range   Glucose-Capillary 173 (H) 65 - 99 mg/dL  Magnesium     Status: Abnormal   Collection Time: 10/16/17  4:00 AM  Result Value Ref Range   Magnesium 1.6 (L) 1.7 - 2.4 mg/dL    Comment: Performed at [facility], [address] [place], [place] [address]  CBC with Differential/Platelet     Status: None   Collection Time: 10/16/17  4:08 AM  Result Value Ref Range   WBC 5.1 4.0 - 10.5 K/uL   RBC 4.43 3.87 - 5.11 MIL/uL   Hemoglobin 12.8 12.0 - 15.0 g/dL   HCT 39.8 36.0 - 46.0 %   MCV 89.8 78.0 - 100.0 fL   MCH 28.9 26.0 - 34.0 pg   MCHC 32.2 30.0 - 36.0 g/dL   RDW 14.6 11.5 - 15.5 %   Platelets 255 150 - 400 K/uL   Neutrophils Relative % 54 %   Neutro Abs 2.8 1.7 - 7.7 K/uL   Lymphocytes Relative 39 %   Lymphs Abs 2.0 0.7 - 4.0 K/uL   Monocytes Relative 6 %   Monocytes Absolute 0.3 0.1 - 1.0 K/uL   Eosinophils Relative 1 %   Eosinophils Absolute 0.0 0.0 - 0.7 K/uL   Basophils Relative 0 %   Basophils Absolute 0.0 0.0 - 0.1 K/uL    Comment: Performed at [facility], [address], [place],  [address]  Comprehensive metabolic panel     Status: Abnormal   Collection Time: 10/16/17  4:08 AM  Result Value Ref Range   Sodium 143 135 - 145 mmol/L   Potassium 3.3 (L) 3.5 - 5.1 mmol/L   Chloride 112 (H) 101 - 111 mmol/L   CO2 22 22 - 32 mmol/L   Glucose, Bld 151 (H) 65 - 99 mg/dL  BUN 7 6 - 20 mg/dL   Creatinine, Ser 0.80 0.44 - 1.00 mg/dL   Calcium 9.1 8.9 - 10.3 mg/dL   Total  Protein 6.9 6.5 - 8.1 g/dL   Albumin 3.7 3.5 - 5.0 g/dL   AST 23 15 - 41 U/L   ALT 37 14 - 54 U/L   Alkaline Phosphatase 103 38 - 126 U/L   Total Bilirubin 0.3 0.3 - 1.2 mg/dL   GFR calc non Af Amer >60 >60 mL/min   GFR calc Af Amer >60 >60 mL/min    Comment: (NOTE) The eGFR has been calculated using the CKD EPI equation. This calculation has not been validated in all clinical situations. eGFR's persistently <60 mL/min signify possible Chronic Kidney Disease.    Anion gap 9 5 - 15    Comment: Performed at [facility], [address] [place], [place] [address]  Glucose, capillary     Status: Abnormal   Collection Time: 10/16/17  8:20 AM  Result Value Ref Range   Glucose-Capillary 153 (H) 65 - 99 mg/dL   Comment 1 Notify RN    Comment 2 Document in Chart   Glucose, capillary     Status: Abnormal   Collection Time: 10/16/17 11:31 AM  Result Value Ref Range   Glucose-Capillary 154 (H) 65 - 99 mg/dL   Comment 1 Notify RN    Comment 2 Document in Chart   Glucose, capillary     Status: Abnormal   Collection Time: 10/16/17  5:04 PM  Result Value Ref Range   Glucose-Capillary 130 (H) 65 - 99 mg/dL   Comment 1 Notify RN    Comment 2 Document in Chart   Glucose, capillary     Status: Abnormal   Collection Time: 10/16/17  9:07 PM  Result Value Ref Range   Glucose-Capillary 160 (H) 65 - 99 mg/dL   Comment 1 Notify RN    Comment 2 Document in Chart   Glucose, capillary     Status: Abnormal   Collection Time: 10/17/17  7:29 AM  Result Value Ref Range   Glucose-Capillary 165 (H) 65 - 99 mg/dL    Studies/Results:   EEG  Findings:  There is a posterior dominant rhythm of 7 Hz which is symmetrical.  No focal slowing is present.  Intermittently, there is generalized delta frequency slowing for a few seconds while awake.  There are no epileptiform discharges or electrographic seizures.  Hyperventilation and photic stimulation are not performed.  Impression:  This is an abnormal EEG.   There is evidence of mild generalized slowing of brain activity which intermittently becomes severe in nature for short bursts of time.  This is non-specific, but may be due to a toxic, metabolic, or infectious encephalopathy.  Clinical correlation is recommended.  The patient is not in non-convulsive status epilepticus.       HEAD NECK CTA FINDINGS: CTA NECK FINDINGS  Aortic arch: There is no calcific atherosclerosis of the aortic arch. There is no aneurysm, dissection or hemodynamically significant stenosis of the visualized ascending aorta and aortic arch. Normal variant aortic arch branching pattern with the brachiocephalic and left common carotid arteries sharing a common origin. The visualized proximal subclavian arteries are normal.  Right carotid system: The right common carotid origin is widely patent. There is no common carotid or internal carotid artery dissection or aneurysm. No hemodynamically significant stenosis.  Left carotid system: The left common carotid origin is widely patent. There is no common carotid or internal carotid  artery dissection or aneurysm. No hemodynamically significant stenosis.  Vertebral arteries: The vertebral system is left dominant. Both vertebral artery origins are normal. Both vertebral arteries are normal to their confluence with the basilar artery.  Skeleton: There is no bony spinal canal stenosis. No lytic or blastic lesions. Mild cervical degenerative disc disease.  Other neck: The nasopharynx is clear. The oropharynx and hypopharynx are normal. The epiglottis is normal. The supraglottic larynx, glottis and subglottic larynx are normal. No retropharyngeal collection. The parapharyngeal spaces are preserved. The parotid and submandibular glands are normal. No sialolithiasis or salivary ductal dilatation. The thyroid gland is normal. There is no cervical lymphadenopathy.  Upper chest: No pneumothorax or pleural effusion. No  nodules or masses.  Review of the MIP images confirms the above findings  CTA HEAD FINDINGS  Anterior circulation:  --Intracranial internal carotid arteries: Normal.  --Anterior cerebral arteries: Normal.  --Middle cerebral arteries: Normal.  --Posterior communicating arteries: Present bilaterally.  Posterior circulation:  --Posterior cerebral arteries: Normal.  --Superior cerebellar arteries: Normal.  --Basilar artery: Normal.  --Anterior inferior cerebellar arteries: Normal.  --Posterior inferior cerebellar arteries: Normal.  Venous sinuses: As permitted by contrast timing, patent.  Anatomic variants: None  Delayed phase: No parenchymal contrast enhancement.  Review of the MIP images confirms the above findings.  IMPRESSION: Normal CTA of the head and neck.       CT scan is reviewed in person and shows evidence of mild white matter disease bilaterally but nothing acute is appreciated.      Micky Sheller A. Merlene Laughter, M.D.  Diplomate, Tax adviser of Psychiatry and Neurology ( Neurology). 10/17/2017, 3:13 PM

## 2017-10-17 NOTE — Discharge Summary (Addendum)
Physician Discharge Summary  Amanda [name]:[id number] DOB: September 25, 1965 DOA: 10/14/2017  PCP: Amanda [name] Neurologist: Dr. Shann MedalK. [place]  Admit date: 10/14/2017 Discharge date: 10/17/2017  Admitted From: Home  Disposition: Home with daughter Recommendations for Outpatient Follow-up:  1. Follow up with PCP in 1 weeks 2. Follow up with Dr. Gerilyn [place]qu[place] neurologist in 1-2 weeks to review final test results 3. Please refer patient to behavioral health for depression treatment and counseling. 4. Please obtain BMP/CBC in one week 5. Please follow up on the following pending results: Final culture results from CSF studies  Home Health: PT/OT/SLP  Discharge Condition: STABLE   CODE STATUS: FULL    Brief Hospitalization Summary: Please see all hospital notes, images, labs for full details of the hospitalization. HPI:   Amanda [name]  is a 52 y.o. female, with history of diabetes mellitus type 2 on oral hypoglycemics, arthritis, fibromyalgia, GERD, hypertension and history of migraines was brought to the ED by family with progressive decline in mental status for past 4 days.  Patient was seen at [facility] 1 week ago with acute gastroenteritis with symptoms of nausea, vomiting and diarrhea and found to have mild AK I.  She got IV fluids in the ED and was discharged home.  Since then family has found her to be less interactive, sleepy most of the time and confused.  She was then taken to the ED 3 days later with complaints of generalized weakness, nausea and some abdominal pain.  She was again given IV fluids, abdominal x-ray unremarkable and given antiemetics for suspected gastroenteritis.  Since then she has been increasingly confused and unable to communicate properly, has been confused even telling her name or calling out for her children.  History provided by patient's son and daughter at bedside who informed that patient was complaining of numbness in the left side of her arm  and face, complaint of some chills and was still having off-and-on vomiting and loose stools. Family denies any recent change in her medications, recent illness, travel or medication overdose. Prior to 1 week back patient was completely normal and independent. Family denies any recent fevers, complains of headache, blurred vision, dizziness, fall at home, syncope, complains of chest pain, shortness of breath, dysuria or recent trauma.  Denies any witnessed seizures or prior history of seizures.  In the ED patient was found to be mildly tachycardic in low 100s, afebrile, blood pressure elevated in the range of systolic 140-170 and diastolic 87-94 mmHg.  Blood work showed normal CBC, potassium of 3.4, normal renal function and glucose of 142.  LFTs were normal except for mildly elevated AST.  UA was negative for infection. Head CT negative for acute findings.  Patient has multiple piercing in her shoulder and back and could not get an MRI. Hospitalist consulted for observation.     Brief Admission Hx: 52 y.o.female,with history of diabetes mellitus type 2 on oral hypoglycemics, arthritis, fibromyalgia, GERD, hypertension and history of migraines was brought to the ED by family with progressive decline in mental status for past 4 days.  Head CT negative for acute findings. Patient has multiple piercing in her shoulder and back and could not get an MRI.  MDM/Assessment & Plan:   1. Acute/subacute encephalopathy -patient slowly improving. LP was unrevealing.  Neurology suggesting acute viral illness led to condition.    CSF cultures and studies pending. Follow up final results outpatient with PCP and neurologist.  2. Type 2 diabetes mellitus-resume home medications.  Increased pravastatin to 80 mg daily.   3. Hypokalemia-repleted and provided magnesium.    4. Hypomagnesemia - IV replacement given, follow up outpatient with PCP.  5. Nausea vomiting/diarrhea- improving with supportive  care. 6. Essential hypertension- hydralazine ordered.  Resume home metoprolol.  DVT prophylaxis: SCD Code Status: Full Family Communication: Daughter at bedside Disposition Plan: Home.   Consultants:  Neurology  Interventional radiology  Procedures:  Lumbar puncture 2/13  Discharge Diagnoses:  Principal Problem:   Acute encephalopathy Active Problems:   Depression   Diabetes mellitus, type 2 (HCC)   Esophageal reflux   Nausea and vomiting   Elevated blood pressure reading   Hypertension   Wears dentures  Discharge Instructions: Discharge Instructions    Call [name] for:  difficulty breathing, headache or visual disturbances   Complete by:  As directed    Call [name] for:  extreme fatigue   Complete by:  As directed    Call [name] for:  hives   Complete by:  As directed    Call [name] for:  persistant dizziness or light-headedness   Complete by:  As directed    Call [name] for:  persistant nausea and vomiting   Complete by:  As directed    Call [name] for:  severe uncontrolled pain   Complete by:  As directed    Increase activity slowly   Complete by:  As directed      Allergies as of 10/17/2017      Reactions   Penicillins Anaphylaxis   Has patient had a PCN reaction causing immediate rash, facial/tongue/throat swelling, SOB or lightheadedness with hypotension: Yes Has patient had a PCN reaction causing severe rash involving mucus membranes or skin necrosis: No Has patient had a PCN reaction that required hospitalization No Has patient had a PCN reaction occurring within the last 10 years: No If all of the above answers are "NO", then may proceed with Cephalosporin use.   Ace Inhibitors Swelling      Medication List    TAKE these medications   albuterol 108 (90 Base) MCG/ACT inhaler Commonly known as:  PROVENTIL HFA Inhale 2 puffs into the lungs every 4 (four) hours as needed for wheezing or shortness of breath.   aspirin EC 81 MG tablet Take 1 tablet (81 mg total) by  mouth daily.   BREO ELLIPTA 200-25 MCG/INH Aepb Generic drug:  fluticasone furoate-vilanterol Inhale 1 puff into the lungs daily.   flunisolide 25 MCG/ACT (0.025%) Soln Commonly known as:  NASALIDE Place 2 sprays into the nose daily as needed.   insulin detemir 100 UNIT/ML injection Commonly known as:  LEVEMIR Inject 10 Units into the skin at bedtime.   INVOKANA 300 MG Tabs tablet Generic drug:  canagliflozin Take 300 mg by mouth daily before breakfast.   ipratropium-albuterol 0.5-2.5 (3) MG/3ML Soln Commonly known as:  DUONEB Take 3 mLs by nebulization every 4 (four) hours as needed.   isosorbide mononitrate 30 MG 24 hr tablet Commonly known as:  IMDUR Take 1 tablet (30 mg total) by mouth daily.   LINZESS 290 MCG Caps capsule Generic drug:  linaclotide Take 1 tablet by mouth daily.   metFORMIN 1000 MG tablet Commonly known as:  GLUCOPHAGE Take 1,000 mg by mouth 2 (two) times daily with a meal.   metoprolol succinate 50 MG 24 hr tablet Commonly known as:  TOPROL-XL Take 50 mg by mouth daily. Take with or immediately following a meal.   montelukast 10 MG tablet Commonly known as:  SINGULAIR Take 1 tablet (10 mg total) by mouth daily.   multivitamin tablet Take 1 tablet by mouth daily.   omeprazole 40 MG capsule Commonly known as:  PRILOSEC Take 1 capsule (40 mg total) by mouth daily. 1 hr before supper   polyethylene glycol packet Commonly known as:  MIRALAX / GLYCOLAX Take 17 g by mouth daily. Mix one tablespoon with 8oz of your favorite juice or water every day until you are having soft formed stools. Then start taking once daily if you didn't have a stool the day before.   pravastatin 80 MG tablet Commonly known as:  PRAVACHOL Take 1 tablet (80 mg total) by mouth daily. What changed:    medication strength  how much to take   promethazine 25 MG tablet Commonly known as:  PHENERGAN Take 1 tablet (25 mg total) by mouth every 6 (six) hours as needed for  nausea or vomiting.   protein supplement shake Liqd Commonly known as:  PREMIER PROTEIN Take 325 mLs (11 oz total) by mouth 3 (three) times daily between meals.   sucralfate 1 g tablet Commonly known as:  CARAFATE Take 1 g by mouth 2 (two) times daily.   SUMAtriptan 25 MG tablet Commonly known as:  IMITREX Take 25 mg by mouth every 2 (two) hours as needed for migraine. May repeat in 2 hours if headache persists or recurs.   tiZANidine 4 MG tablet Commonly known as:  ZANAFLEX Take 4 mg by mouth every 6 (six) hours as needed for muscle spasms.   vitamin C 1000 MG tablet Take 1,000 mg by mouth daily.   zolpidem 5 MG tablet Commonly known as:  AMBIEN Take 5 mg by mouth at bedtime.            Durable Medical Equipment  (From admission, onward)        Start     Ordered   10/16/17 1120  For home use only DME [name] rolling  Once    Question:  Patient needs a [name] to treat with the following condition  Answer:  Weakness   10/16/17 1120   10/16/17 1120  For home use only DME Shower stool  Once     10/16/17 1120     Follow-up Information    Amanda Loveless, [name]. Schedule an appointment as soon as possible for a visit in 1 week(s).   Specialty:  Internal Medicine Why:  Hospital follow Up  Contact information: [address] [place] [address] [phone]        Beryle Beams, [name]. Schedule an appointment as soon as possible for a visit in 2 week(s).   Specialty:  Neurology Why:  Hospital Follow Up: Test Results Contact information: [address] [place] [address] [phone]          Allergies  Allergen Reactions  . Penicillins Anaphylaxis    Has patient had a PCN reaction causing immediate rash, facial/tongue/throat swelling, SOB or lightheadedness with hypotension: Yes Has patient had a PCN reaction causing severe rash involving mucus membranes or skin necrosis: No Has patient had a PCN reaction that required hospitalization No Has  patient had a PCN reaction occurring within the last 10 years: No If all of the above answers are "NO", then may proceed with Cephalosporin use.   . Ace Inhibitors Swelling   Allergies as of 10/17/2017      Reactions   Penicillins Anaphylaxis   Has patient had a PCN reaction causing immediate rash, facial/tongue/throat swelling,  SOB or lightheadedness with hypotension: Yes Has patient had a PCN reaction causing severe rash involving mucus membranes or skin necrosis: No Has patient had a PCN reaction that required hospitalization No Has patient had a PCN reaction occurring within the last 10 years: No If all of the above answers are "NO", then may proceed with Cephalosporin use.   Ace Inhibitors Swelling      Medication List    TAKE these medications   albuterol 108 (90 Base) MCG/ACT inhaler Commonly known as:  PROVENTIL HFA Inhale 2 puffs into the lungs every 4 (four) hours as needed for wheezing or shortness of breath.   aspirin EC 81 MG tablet Take 1 tablet (81 mg total) by mouth daily.   BREO ELLIPTA 200-25 MCG/INH Aepb Generic drug:  fluticasone furoate-vilanterol Inhale 1 puff into the lungs daily.   flunisolide 25 MCG/ACT (0.025%) Soln Commonly known as:  NASALIDE Place 2 sprays into the nose daily as needed.   insulin detemir 100 UNIT/ML injection Commonly known as:  LEVEMIR Inject 10 Units into the skin at bedtime.   INVOKANA 300 MG Tabs tablet Generic drug:  canagliflozin Take 300 mg by mouth daily before breakfast.   ipratropium-albuterol 0.5-2.5 (3) MG/3ML Soln Commonly known as:  DUONEB Take 3 mLs by nebulization every 4 (four) hours as needed.   isosorbide mononitrate 30 MG 24 hr tablet Commonly known as:  IMDUR Take 1 tablet (30 mg total) by mouth daily.   LINZESS 290 MCG Caps capsule Generic drug:  linaclotide Take 1 tablet by mouth daily.   metFORMIN 1000 MG tablet Commonly known as:  GLUCOPHAGE Take 1,000 mg by mouth 2 (two) times daily with a  meal.   metoprolol succinate 50 MG 24 hr tablet Commonly known as:  TOPROL-XL Take 50 mg by mouth daily. Take with or immediately following a meal.   montelukast 10 MG tablet Commonly known as:  SINGULAIR Take 1 tablet (10 mg total) by mouth daily.   multivitamin tablet Take 1 tablet by mouth daily.   omeprazole 40 MG capsule Commonly known as:  PRILOSEC Take 1 capsule (40 mg total) by mouth daily. 1 hr before supper   polyethylene glycol packet Commonly known as:  MIRALAX / GLYCOLAX Take 17 g by mouth daily. Mix one tablespoon with 8oz of your favorite juice or water every day until you are having soft formed stools. Then start taking once daily if you didn't have a stool the day before.   pravastatin 80 MG tablet Commonly known as:  PRAVACHOL Take 1 tablet (80 mg total) by mouth daily. What changed:    medication strength  how much to take   promethazine 25 MG tablet Commonly known as:  PHENERGAN Take 1 tablet (25 mg total) by mouth every 6 (six) hours as needed for nausea or vomiting.   protein supplement shake Liqd Commonly known as:  PREMIER PROTEIN Take 325 mLs (11 oz total) by mouth 3 (three) times daily between meals.   sucralfate 1 g tablet Commonly known as:  CARAFATE Take 1 g by mouth 2 (two) times daily.   SUMAtriptan 25 MG tablet Commonly known as:  IMITREX Take 25 mg by mouth every 2 (two) hours as needed for migraine. May repeat in 2 hours if headache persists or recurs.   tiZANidine 4 MG tablet Commonly known as:  ZANAFLEX Take 4 mg by mouth every 6 (six) hours as needed for muscle spasms.   vitamin C 1000 MG tablet Take 1,000 mg  by mouth daily.   zolpidem 5 MG tablet Commonly known as:  AMBIEN Take 5 mg by mouth at bedtime.            Durable Medical Equipment  (From admission, onward)        Start     Ordered   10/16/17 1120  For home use only DME [name] rolling  Once    Question:  Patient needs a [name] to treat with the  following condition  Answer:  Weakness   10/16/17 1120   10/16/17 1120  For home use only DME Shower stool  Once     10/16/17 1120      Procedures/Studies: Ct Angio Head W Or Wo Contrast  Result Date: 10/14/2017 CLINICAL DATA:  Altered mental status and ataxia. EXAM: CT ANGIOGRAPHY HEAD AND NECK TECHNIQUE: Multidetector CT imaging of the head and neck was performed using the standard protocol during bolus administration of intravenous contrast. Multiplanar CT image reconstructions and MIPs were obtained to evaluate the vascular anatomy. Carotid stenosis measurements (when applicable) are obtained utilizing NASCET criteria, using the distal internal carotid diameter as the denominator. CONTRAST:  80mL ISOVUE-370 IOPAMIDOL (ISOVUE-370) INJECTION 76% COMPARISON:  Head CT 10/14/2017 FINDINGS: CTA NECK FINDINGS Aortic arch: There is no calcific atherosclerosis of the aortic arch. There is no aneurysm, dissection or hemodynamically significant stenosis of the visualized ascending aorta and aortic arch. Normal variant aortic arch branching pattern with the brachiocephalic and left common carotid arteries sharing a common origin. The visualized proximal subclavian arteries are normal. Right carotid system: The right common carotid origin is widely patent. There is no common carotid or internal carotid artery dissection or aneurysm. No hemodynamically significant stenosis. Left carotid system: The left common carotid origin is widely patent. There is no common carotid or internal carotid artery dissection or aneurysm. No hemodynamically significant stenosis. Vertebral arteries: The vertebral system is left dominant. Both vertebral artery origins are normal. Both vertebral arteries are normal to their confluence with the basilar artery. Skeleton: There is no bony spinal canal stenosis. No lytic or blastic lesions. Mild cervical degenerative disc disease. Other neck: The nasopharynx is clear. The oropharynx and  hypopharynx are normal. The epiglottis is normal. The supraglottic larynx, glottis and subglottic larynx are normal. No retropharyngeal collection. The parapharyngeal spaces are preserved. The parotid and submandibular glands are normal. No sialolithiasis or salivary ductal dilatation. The thyroid gland is normal. There is no cervical lymphadenopathy. Upper chest: No pneumothorax or pleural effusion. No nodules or masses. Review of the MIP images confirms the above findings CTA HEAD FINDINGS Anterior circulation: --Intracranial internal carotid arteries: Normal. --Anterior cerebral arteries: Normal. --Middle cerebral arteries: Normal. --Posterior communicating arteries: Present bilaterally. Posterior circulation: --Posterior cerebral arteries: Normal. --Superior cerebellar arteries: Normal. --Basilar artery: Normal. --Anterior inferior cerebellar arteries: Normal. --Posterior inferior cerebellar arteries: Normal. Venous sinuses: As permitted by contrast timing, patent. Anatomic variants: None Delayed phase: No parenchymal contrast enhancement. Review of the MIP images confirms the above findings. IMPRESSION: Normal CTA of the head and neck. Electronically Signed   By: Deatra Robinson M.D.   On: 10/14/2017 13:36   Dg Chest 1 View  Result Date: 10/14/2017 CLINICAL DATA:  Chest pain and shortness of breath. History of asthma, hypertension, and diabetes. Nonsmoker. EXAM: CHEST 1 VIEW COMPARISON:  Chest x-ray of June 10, 2017 FINDINGS: The lungs are adequately inflated and clear. The heart is top-normal in size. The pulmonary vascularity is normal. There is no pleural effusion. The trachea is midline. The  bony thorax exhibits no acute abnormality. IMPRESSION: There is no active cardiopulmonary disease. Electronically Signed   By: David  [place] M.D.   On: 10/14/2017 07:26   Dg Abdomen 1 View  Result Date: 10/10/2017 CLINICAL DATA:  52 year old female with 3 days of abdominal pain. EXAM: ABDOMEN - 1 VIEW COMPARISON:   CT Abdomen and Pelvis 10/24/2016. Lumbar radiographs 12/28/2010. FINDINGS: Supine view. Previous ventral abdominal hernia repair with mesh. Non obstructed bowel gas pattern. Stable abdominal and pelvic visceral contours. Stable visualized osseous structures. Lower lumbar and sacral spina bifida occulta. No definite pneumoperitoneum on this supine view. IMPRESSION: Normal bowel gas pattern. Electronically Signed   By: Odessa Fleming M.D.   On: 10/10/2017 14:42   Ct Head Wo Contrast  Result Date: 10/14/2017 CLINICAL DATA:  Altered level of consciousness today. EXAM: CT HEAD WITHOUT CONTRAST TECHNIQUE: Contiguous axial images were obtained from the base of the skull through the vertex without intravenous contrast. COMPARISON:  Head CT scan 12/01/2015. FINDINGS: Brain: No evidence of acute infarction, hemorrhage, hydrocephalus, extra-axial collection or mass lesion/mass effect. Vascular: No hyperdense vessel or unexpected calcification. Skull: Intact. Sinuses/Orbits: Negative. Other: None. IMPRESSION: Negative head CT. Electronically Signed   By: Drusilla Kanner M.D.   On: 10/14/2017 07:40   Ct Angio Neck W Or Wo Contrast  Result Date: 10/14/2017 CLINICAL DATA:  Altered mental status and ataxia. EXAM: CT ANGIOGRAPHY HEAD AND NECK TECHNIQUE: Multidetector CT imaging of the head and neck was performed using the standard protocol during bolus administration of intravenous contrast. Multiplanar CT image reconstructions and MIPs were obtained to evaluate the vascular anatomy. Carotid stenosis measurements (when applicable) are obtained utilizing NASCET criteria, using the distal internal carotid diameter as the denominator. CONTRAST:  80mL ISOVUE-370 IOPAMIDOL (ISOVUE-370) INJECTION 76% COMPARISON:  Head CT 10/14/2017 FINDINGS: CTA NECK FINDINGS Aortic arch: There is no calcific atherosclerosis of the aortic arch. There is no aneurysm, dissection or hemodynamically significant stenosis of the visualized ascending aorta  and aortic arch. Normal variant aortic arch branching pattern with the brachiocephalic and left common carotid arteries sharing a common origin. The visualized proximal subclavian arteries are normal. Right carotid system: The right common carotid origin is widely patent. There is no common carotid or internal carotid artery dissection or aneurysm. No hemodynamically significant stenosis. Left carotid system: The left common carotid origin is widely patent. There is no common carotid or internal carotid artery dissection or aneurysm. No hemodynamically significant stenosis. Vertebral arteries: The vertebral system is left dominant. Both vertebral artery origins are normal. Both vertebral arteries are normal to their confluence with the basilar artery. Skeleton: There is no bony spinal canal stenosis. No lytic or blastic lesions. Mild cervical degenerative disc disease. Other neck: The nasopharynx is clear. The oropharynx and hypopharynx are normal. The epiglottis is normal. The supraglottic larynx, glottis and subglottic larynx are normal. No retropharyngeal collection. The parapharyngeal spaces are preserved. The parotid and submandibular glands are normal. No sialolithiasis or salivary ductal dilatation. The thyroid gland is normal. There is no cervical lymphadenopathy. Upper chest: No pneumothorax or pleural effusion. No nodules or masses. Review of the MIP images confirms the above findings CTA HEAD FINDINGS Anterior circulation: --Intracranial internal carotid arteries: Normal. --Anterior cerebral arteries: Normal. --Middle cerebral arteries: Normal. --Posterior communicating arteries: Present bilaterally. Posterior circulation: --Posterior cerebral arteries: Normal. --Superior cerebellar arteries: Normal. --Basilar artery: Normal. --Anterior inferior cerebellar arteries: Normal. --Posterior inferior cerebellar arteries: Normal. Venous sinuses: As permitted by contrast timing, patent. Anatomic variants: None  Delayed phase:  No parenchymal contrast enhancement. Review of the MIP images confirms the above findings. IMPRESSION: Normal CTA of the head and neck. Electronically Signed   By: Deatra Robinson M.D.   On: 10/14/2017 13:36   Dg Fluoro Guided Needle Plc Aspiration/injection Loc  Result Date: 10/15/2017 CLINICAL DATA:  Vertigo, confusion, altered mental status EXAM: DIAGNOSTIC LUMBAR PUNCTURE UNDER FLUOROSCOPIC GUIDANCE FLUOROSCOPY TIME:  Fluoroscopy Time:  0 minutes 24 seconds Radiation Exposure Index (if provided by the fluoroscopic device): 9.0 mGy Number of Acquired Spot Images: 2 digital screen capture during fluoroscopy PROCEDURE: Procedure, benefits, and risks were discussed with the patient, including alternatives. Patient's questions were answered. Written informed consent was obtained. Timeout protocol followed. Patient placed prone. L4-L5 disc space was localized under fluoroscopy. Skin prepped and draped in usual sterile fashion. Skin and soft tissues anesthetized with 2 mL of 1% lidocaine. 22 gauge needle was advanced into the spinal canal where clear colorless CSF was encountered. 10 of CSF was obtained in 4 tubes for requested analysis. Procedure tolerated very well by patient without immediate complication. IMPRESSION: Successful fluoroscopic guided lumbar puncture as above. Electronically Signed   By: Ulyses Southward M.D.   On: 10/15/2017 14:41     Subjective: Pt reports that she is feeling a lot better today.  She is alert and oriented x 3 and will be staying with her daughter.    Discharge Exam: Vitals:   10/17/17 0638 10/17/17 0813  BP: 118/77 (!) 162/110  Pulse: 77 89  Resp: 20 18  Temp: 98.4 F (36.9 C)   SpO2: 97% 97%   Vitals:   10/16/17 2238 10/17/17 0238 10/17/17 0638 10/17/17 0813  BP: 132/89 128/78 118/77 (!) 162/110  Pulse: 90 88 77 89  Resp: 20 20 20 18   Temp: 98 F (36.7 C) 98 F (36.7 C) 98.4 F (36.9 C)   TempSrc: Oral Oral Oral   SpO2: 97% 98% 97% 97%   Weight:   91.7 kg (202 lb 2.6 oz)   Height:        General: Pt is alert, awake, not in acute distress Cardiovascular: RRR, S1/S2 +, no rubs, no gallops Respiratory: CTA bilaterally, no wheezing, no rhonchi Abdominal: Soft, NT, ND, bowel sounds + Extremities: no edema, no cyanosis   The results of significant diagnostics from this hospitalization (including imaging, microbiology, ancillary and laboratory) are listed below for reference.     Microbiology: Recent Results (from the past 240 hour(s))  Culture, blood (routine x 2)     Status: None (Preliminary result)   Collection Time: 10/14/17 11:58 AM  Result Value Ref Range Status   Specimen Description BLOOD LEFT ANTECUBITAL  Final   Special Requests   Final    BOTTLES DRAWN AEROBIC AND ANAEROBIC Blood Culture adequate volume   Culture   Final    NO GROWTH 3 DAYS Performed at [facility], [address], [place], [place] [address]    Report Status PENDING  Incomplete  Culture, blood (routine x 2)     Status: None (Preliminary result)   Collection Time: 10/14/17 11:58 AM  Result Value Ref Range Status   Specimen Description BLOOD LEFT ARM  Final   Special Requests   Final    BOTTLES DRAWN AEROBIC ONLY Blood Culture results may not be optimal due to an inadequate volume of blood received in culture bottles   Culture   Final    NO GROWTH 3 DAYS Performed at [facility], [address], [place], [place] [address]  Report Status PENDING  Incomplete  CSF culture     Status: None (Preliminary result)   Collection Time: 10/15/17  2:14 PM  Result Value Ref Range Status   Specimen Description   Final    CSF Performed at [facility], [address] [place], [place] [address]    Special Requests   Final    NONE Performed at [facility], [address], [place], [place] [address]    Gram Stain   Final    NO ORGANISMS SEEN RARE WBC PRESENT, PREDOMINANTLY MONONUCLEAR Performed at [facility], [address], [place],  [place] [address]    Culture   Final    NO GROWTH < 24 HOURS Performed at [facility], [address], [place], [place] [address]    Report Status PENDING  Incomplete     Labs: BNP (last 3 results) Recent Labs    06/10/17 1059  BNP 24.0   Basic Metabolic Panel: Recent Labs  Lab 10/10/17 1028 10/14/17 0545 10/16/17 0400 10/16/17 0408  NA 144 145  --  143  K 3.3* 3.4*  --  3.3*  CL 105 110  --  112*  CO2 25 22  --  22  GLUCOSE 123* 142*  --  151*  BUN 11 18  --  7  CREATININE 1.00 0.99  --  0.80  CALCIUM 10.5* 9.9  --  9.1  MG  --   --  1.6*  --    Liver Function Tests: Recent Labs  Lab 10/14/17 0545 10/16/17 0408  AST 47* 23  ALT 51 37  ALKPHOS 125 103  BILITOT 0.1* 0.3  PROT 8.4* 6.9  ALBUMIN 4.5 3.7   No results for input(s): LIPASE, AMYLASE in the last 168 hours. Recent Labs  Lab 10/14/17 0545  AMMONIA 20   CBC: Recent Labs  Lab 10/10/17 1028 10/14/17 0545 10/16/17 0408  WBC 4.6 5.6 5.1  NEUTROABS 2.4  --  2.8  HGB 14.2 14.7 12.8  HCT 43.3 44.5 39.8  MCV 90.0 90.6 89.8  PLT 246 278 255   Cardiac Enzymes: No results for input(s): CKTOTAL, CKMB, CKMBINDEX, TROPONINI in the last 168 hours. BNP: Invalid input(s): POCBNP CBG: Recent Labs  Lab 10/16/17 0820 10/16/17 1131 10/16/17 1704 10/16/17 2107 10/17/17 0729  GLUCAP 153* 154* 130* 160* 165*   D-Dimer No results for input(s): DDIMER in the last 72 hours. Hgb A1c No results for input(s): HGBA1C in the last 72 hours. Lipid Profile No results for input(s): CHOL, HDL, LDLCALC, TRIG, CHOLHDL, LDLDIRECT in the last 72 hours. Thyroid function studies No results for input(s): TSH, T4TOTAL, T3FREE, THYROIDAB in the last 72 hours.  Invalid input(s): FREET3 Anemia work up No results for input(s): VITAMINB12, FOLATE, FERRITIN, TIBC, IRON, RETICCTPCT in the last 72 hours. Urinalysis    Component Value Date/Time   COLORURINE YELLOW 10/14/2017 0629   APPEARANCEUR CLEAR 10/14/2017 0629    APPEARANCEUR Cloudy 11/12/2011 1721   LABSPEC 1.018 10/14/2017 0629   LABSPEC 1.010 11/12/2011 1721   PHURINE 7.0 10/14/2017 0629   GLUCOSEU NEGATIVE 10/14/2017 0629   GLUCOSEU Negative 11/12/2011 1721   HGBUR LARGE (A) 10/14/2017 0629   BILIRUBINUR NEGATIVE 10/14/2017 0629   BILIRUBINUR Negative 11/12/2011 1721   KETONESUR NEGATIVE 10/14/2017 0629   PROTEINUR NEGATIVE 10/14/2017 0629   NITRITE NEGATIVE 10/14/2017 0629   LEUKOCYTESUR NEGATIVE 10/14/2017 0629   LEUKOCYTESUR 1+ 11/12/2011 1721   Sepsis Labs Invalid input(s): PROCALCITONIN,  WBC,  LACTICIDVEN Microbiology Recent Results (  from the past 240 hour(s))  Culture, blood (routine x 2)     Status: None (Preliminary result)   Collection Time: 10/14/17 11:58 AM  Result Value Ref Range Status   Specimen Description BLOOD LEFT ANTECUBITAL  Final   Special Requests   Final    BOTTLES DRAWN AEROBIC AND ANAEROBIC Blood Culture adequate volume   Culture   Final    NO GROWTH 3 DAYS Performed at [facility], [address] [place], [place] [address]    Report Status PENDING  Incomplete  Culture, blood (routine x 2)     Status: None (Preliminary result)   Collection Time: 10/14/17 11:58 AM  Result Value Ref Range Status   Specimen Description BLOOD LEFT ARM  Final   Special Requests   Final    BOTTLES DRAWN AEROBIC ONLY Blood Culture results may not be optimal due to an inadequate volume of blood received in culture bottles   Culture   Final    NO GROWTH 3 DAYS Performed at [facility], [address] [place], [place] [address]    Report Status PENDING  Incomplete  CSF culture     Status: None (Preliminary result)   Collection Time: 10/15/17  2:14 PM  Result Value Ref Range Status   Specimen Description   Final    CSF Performed at [facility], [address], [place], [place] [address]    Special Requests   Final    NONE Performed at [facility], [address], [place], [place] [address]    Gram Stain   Final    NO  ORGANISMS SEEN RARE WBC PRESENT, PREDOMINANTLY MONONUCLEAR Performed at [facility], [address], [place], [place] [address]    Culture   Final    NO GROWTH < 24 HOURS Performed at [facility], [address], [place], [place] [address]    Report Status PENDING  Incomplete   Time coordinating discharge: 34 mins  SIGNED:  Standley Dakins, [name]  [facility] 10/17/2017, 9:55 AM Pager [phone]  If 7PM-7AM, please contact night-coverage [email or web address] Password [email or web address]

## 2017-10-17 NOTE — Discharge Instructions (Signed)
Follow with Primary MD  Margaretann LovelessKhan, Neelam S, MD  and other consultant's as instructed your Hospitalist MD  Please get a complete blood count and chemistry panel checked by your Primary MD at your next visit, and again as instructed by your Primary MD.  Get Medicines reviewed and adjusted: Please take all your medications with you for your next visit with your Primary MD  Laboratory/radiological data: Please request your Primary MD to go over all hospital tests and procedure/radiological results at the follow up, please ask your Primary MD to get all Hospital records sent to his/her office.  In some cases, they will be blood work, cultures and biopsy results pending at the time of your discharge. Please request that your primary care M.D. follows up on these results.  Also Note the following: If you experience worsening of your admission symptoms, develop shortness of breath, life threatening emergency, suicidal or homicidal thoughts you must seek medical attention immediately by calling 911 or calling your MD immediately  if symptoms less severe.  You must read complete instructions/literature along with all the possible adverse reactions/side effects for all the Medicines you take and that have been prescribed to you. Take any new Medicines after you have completely understood and accpet all the possible adverse reactions/side effects.   Do not drive when taking Pain medications or sleeping medications (Benzodaizepines)  Do not take more than prescribed Pain, Sleep and Anxiety Medications. It is not advisable to combine anxiety,sleep and pain medications without talking with your primary care practitioner  Special Instructions: If you have smoked or chewed Tobacco  in the last 2 yrs please stop smoking, stop any regular Alcohol  and or any Recreational drug use.  Wear Seat belts while driving.  Please note: You were cared for by a hospitalist during your hospital stay. Once you are discharged,  your primary care physician will handle any further medical issues. Please note that NO REFILLS for any discharge medications will be authorized once you are discharged, as it is imperative that you return to your primary care physician (or establish a relationship with a primary care physician if you do not have one) for your post hospital discharge needs so that they can reassess your need for medications and monitor your lab values.      Confusion Confusion is the inability to think with your usual speed or clarity. Confusion may come on quickly or slowly over time. How quickly the confusion comes on depends on the cause. Confusion can be due to any number of causes. What are the causes?  Concussion, head injury, or head trauma.  Seizures.  Stroke.  Fever.  Brain tumor.  Age related decreased brain function (dementia).  Heightened emotional states like rage or terror.  Mental illness in which the person loses the ability to determine what is real and what is not (hallucinations).  Infections such as a urinary tract infection (UTI).  Toxic effects from alcohol, drugs, or prescription medicines.  Dehydration and an imbalance of salts in the body (electrolytes).  Lack of sleep.  Low blood sugar (diabetes).  Low levels of oxygen from conditions such as chronic lung disorders.  Drug interactions or other medicine side effects.  Nutritional deficiencies, especially niacin, thiamine, vitamin C, or vitamin B.  Sudden drop in body temperature (hypothermia).  Change in routine, such as when traveling or hospitalized. What are the signs or symptoms? People often describe their thinking as cloudy or unclear when they are confused. Confusion can also include  feeling disoriented. That means you are unaware of where or who you are. You may also not know what the date or time is. If confused, you may also have difficulty paying attention, remembering, and making decisions. Some  people also act aggressively when they are confused. How is this diagnosed? The medical evaluation of confusion may include:  Blood and urine tests.  X-rays.  Brain and nervous system tests.  Analyzing your brain waves (electroencephalogram or EEG).  Magnetic resonance imaging (MRI) of your head.  Computed tomography (CT) scan of your head.  Mental status tests in which your health care provider may ask many questions. Some of these questions may seem silly or strange, but they are a very important test to help diagnose and treat confusion.  How is this treated? An admission to the hospital may not be needed, but a person with confusion should not be left alone. Stay with a family member or friend until the confusion clears. Avoid alcohol, pain relievers, or sedative drugs until you have fully recovered. Do not drive until directed by your health care provider. Follow these instructions at home: What family and friends can do:  To find out if someone is confused, ask the person to state his or her name, age, and the date. If the person is unsure or answers incorrectly, he or she is confused.  Always introduce yourself, no matter how well the person knows you.  Often remind the person of his or her location.  Place a calendar and clock near the confused person.  Help the person with his or her medicines. You may want to use a pill box, an alarm as a reminder, or give the person each dose as prescribed.  Talk about current events and plans for the day.  Try to keep the environment calm, quiet, and peaceful.  Make sure the person keeps follow-up visits with his or her health care provider.  How is this prevented? Ways to prevent confusion:  Avoid alcohol.  Eat a balanced diet.  Get enough sleep.  Take medicine only as directed by your health care provider.  Do not become isolated. Spend time with other people and make plans for your days.  Keep careful watch on your  blood sugar levels if you are diabetic.  Get help right away if:  You develop severe headaches, repeated vomiting, seizures, blackouts, or slurred speech.  There is increasing confusion, weakness, numbness, restlessness, or personality changes.  You develop a loss of balance, have marked dizziness, feel uncoordinated, or fall.  You have delusions, hallucinations, or develop severe anxiety.  Your family members think you need to be rechecked. This information is not intended to replace advice given to you by your health care provider. Make sure you discuss any questions you have with your health care provider. Document Released: 09/26/2004 Document Revised: 03/08/2016 Document Reviewed: 09/24/2013 Elsevier Interactive Patient Education  [employer].

## 2017-10-19 LAB — CULTURE, BLOOD (ROUTINE X 2)
Culture: NO GROWTH
Culture: NO GROWTH
Special Requests: ADEQUATE

## 2017-10-19 LAB — HSV(HERPES SMPLX VRS)ABS-I+II(IGG)-CSF: HSV Type I/II Ab, IgG CSF: 1.03 IV — ABNORMAL HIGH (ref <=0.89)

## 2017-10-19 LAB — CSF CULTURE W GRAM STAIN
Culture: NO GROWTH
Gram Stain: NONE SEEN

## 2017-10-26 ENCOUNTER — Other Ambulatory Visit: Payer: Self-pay | Admitting: Internal Medicine

## 2017-11-06 ENCOUNTER — Telehealth: Payer: Self-pay | Admitting: Internal Medicine

## 2017-11-06 ENCOUNTER — Encounter: Payer: Self-pay | Admitting: Internal Medicine

## 2017-11-06 ENCOUNTER — Ambulatory Visit (INDEPENDENT_AMBULATORY_CARE_PROVIDER_SITE_OTHER): Payer: 59 | Admitting: Internal Medicine

## 2017-11-06 VITALS — BP 90/60 | HR 135 | Resp 16 | Ht 65.0 in | Wt 190.0 lb

## 2017-11-06 DIAGNOSIS — R0600 Dyspnea, unspecified: Secondary | ICD-10-CM | POA: Diagnosis not present

## 2017-11-06 DIAGNOSIS — J4530 Mild persistent asthma, uncomplicated: Secondary | ICD-10-CM | POA: Diagnosis not present

## 2017-11-06 DIAGNOSIS — J4540 Moderate persistent asthma, uncomplicated: Secondary | ICD-10-CM | POA: Diagnosis not present

## 2017-11-06 MED ORDER — TIOTROPIUM BROMIDE MONOHYDRATE 18 MCG IN CAPS: 18.0000 ug | ORAL_CAPSULE | Freq: Every day | RESPIRATORY_TRACT | 2 refills | Status: DC

## 2017-11-06 MED ORDER — ALBUTEROL SULFATE HFA 108 (90 BASE) MCG/ACT IN AERS
2.0000 | INHALATION_SPRAY | RESPIRATORY_TRACT | 1 refills | Status: DC | PRN
Start: 2017-11-06 — End: 2018-07-21

## 2017-11-06 NOTE — Telephone Encounter (Signed)
Returned call and made patient daughter aware who also had appt today [facility]  She verbalizes understanding. Nothing further needed.

## 2017-11-06 NOTE — Telephone Encounter (Signed)
Patient states that medication prescribed at visit today should be [facility] [place] [facility] of [facility] that is on [address]

## 2017-11-06 NOTE — Progress Notes (Signed)
[facility] Consultation      Assessment and Plan:  52 yo female with persistent asthma and frequent exacerbations.   Asthma  --Continued asthma symptoms, particularly at night.  Had improved with Spiriva but then she ran out. --continue Breo, Singulair.  Restart Spiriva.  Asked to use nebulizer in the evening for wheezing, asked to use rescue inhaler as needed for dyspnea, and before her physical therapy sessions. -Patient is already received flu vaccine and pneumonia vaccine.  Dyspnea.  --Improved, she has some atelectasis on CT scan, and restriction on PFT suggestive of obesity, recommended that she continue exercising   OSA -Recently diagnosed with sleep apnea, she is doing well with cpap,  to continue.   GERD. -Continue omeprazole daily. Orders Placed This Encounter  Procedures  . IgE   Meds ordered this encounter  Medications  . albuterol (PROVENTIL HFA) 108 (90 Base) MCG/ACT inhaler    Sig: Inhale 2 puffs into the lungs every 4 (four) hours as needed for wheezing or shortness of breath.    Dispense:  3 Inhaler    Refill:  1  . tiotropium (SPIRIVA HANDIHALER) 18 MCG inhalation capsule    Sig: Place 1 capsule (18 mcg total) into inhaler and inhale daily.    Dispense:  30 capsule    Refill:  2   Return in about 3 months (around 02/06/2018).   Date: 11/06/2017  MRN# [id number] CARMINE YOUNGBERG 10/16/1965  Referring Physician: Pasty Spillers McLean-Scocozza, MD   [name] is a 52 y.o. old female seen in consultation for chief complaint of:    Chief Complaint  Patient presents with  . Asthma    pt states she has occasional cough wht mucus that was yellow but has now returned to clear.  . Wheezing    occasional   . Chest Pain    comes and goes     HPI:   The patient is a 52 year old female with a history of asthma, hypertension, diabetes, GERD, fibromyalgia. She was admitted to the hospital in July of 2017 with pneumonia and AE Asthma.  Also had  a recent ER visit due to asthma in October 2018. She has been to [facility] because of a TIA since her last visit.  At last visit asked to continue with singulair, nebs, Breo.  Sent for Rast testing which was not completed. She was on spiriva in the past but it ran out and she did not refill.  She has has a night time cough and wheeze. She is on Breo, she does the nebulizer 3 days per week, does not do it before she goes to bed, she does not have a rescue inhaler.  She is doing PT and feels that she has some wheezing.  She is not on prednisone but she does take singulair.   She has a dog that she just got a week, she does not take an anhistamine.    She has gerd, and she takes omeprazole every night.   She has no pets. She has been diagnosed with OSA, she is now on cpap, she is more awake during the day.   **8/1/18CBC with differential. Eosinophil equals 0  Beginning O2 sat on RA at rest. After walking 600 feet her sat was normal and HR 115. Moderate dyspnea but recovered quickly.   CT chest images from 05/21/16 reviewed; mild basilar atelectasis and minimal subpleural interstitial changes.  Chest x-ray 03/20/16 and CT chest images from 01/04/16: Bibasilar subpleural fine  interstitial changes, some mild bibasilar bronchiectasis changes. Cardiomegaly> Finding c/w CHF vs early pulmonary fibrosis.   Summary of outside records: Patient has a history of diabetes mellitus, hypertension, sleep apnea, asthma, multiple episodes of pneumonia, review of CMP from 03/26/16, normal. She has been having trouble with wheezing and has been treated for asthma exacerbation and pneumonia. [facility] and was told it was consistent with heart failure.  CBC 04/11/16: Eos 0.1 RAST: Pending.  PFT 05/23/16: Spirometry shows restriction without reversal with bronchodilatory therapy.   Medication:   Reviewed.     Allergies:  Penicillins and Ace inhibitors  Review of Systems: Gen:  Denies  fever, sweats,  chills HEENT: Denies blurred vision,  Cvc:  No dizziness, chest pain. Resp:   Denies cough or sputum production, Gi: Denies swallowing difficulty, stomach pain. Other:  All other systems were reviewed with the patient and were negative other that what is mentioned in the HPI.   Physical Examination:   VS: BP 90/60 (BP Location: Left Arm, Cuff Size: Normal)   Pulse (!) 135   Resp 16   Ht 5\' 5"  (1.651 m)   Wt 190 lb (86.2 kg)   SpO2 98%   BMI 31.62 kg/m   General Appearance: No distress  Neuro:without focal findings,  speech normal,  HEENT: PERRLA, EOM intact.   Pulmonary: normal breath sounds, No wheezing.  CardiovascularNormal S1,S2.  No m/r/g.   Abdomen: Benign, Soft, non-tender. Renal:  No costovertebral tenderness  GU:  No performed at this time. Endoc: No evident thyromegaly, no signs of acromegaly. Skin:   warm, no rashes, no ecchymosis  Extremities: normal, no cyanosis, clubbing.  Other findings:    LABORATORY PANEL:   CBC No results for input(s): WBC, HGB, HCT, PLT in the last 168 hours. ------------------------------------------------------------------------------------------------------------------  Chemistries  No results for input(s): NA, K, CL, CO2, GLUCOSE, BUN, CREATININE, CALCIUM, MG, AST, ALT, ALKPHOS, BILITOT in the last 168 hours.  Invalid input(s): GFRCGP ------------------------------------------------------------------------------------------------------------------  Cardiac Enzymes No results for input(s): TROPONINI in the last 168 hours. ------------------------------------------------------------  RADIOLOGY:  No results found.     Thank  you for the consultation and for allowing [facility] to assist in the care of your patient. Our recommendations are noted above.  Please contact us if we can be of further service.   Wells Guileseep Channon Ambrosini, MD.  Board Certified in Internal Medicine, Pulmonary Medicine, Critical  Care Medicine, and Sleep Medicine.  [facility] Office Number: [phone]  Santiago Gladavid Kasa, M.D.  Billy Fischeravid Simonds, M.D  11/06/2017

## 2017-11-06 NOTE — Patient Instructions (Addendum)
Take 2 puffs of rescue inhaler before physical therapy.  Use nebulizer at night before bedtime.  Will send for blood work.  Will restart spiriva.   No dogs in the bedroom!

## 2017-11-21 ENCOUNTER — Other Ambulatory Visit: Payer: Self-pay | Admitting: Internal Medicine

## 2017-12-10 ENCOUNTER — Other Ambulatory Visit: Payer: Self-pay | Admitting: Nurse Practitioner

## 2017-12-10 DIAGNOSIS — Z1231 Encounter for screening mammogram for malignant neoplasm of breast: Secondary | ICD-10-CM

## 2017-12-11 ENCOUNTER — Other Ambulatory Visit: Payer: Self-pay | Admitting: Nurse Practitioner

## 2017-12-11 DIAGNOSIS — R1310 Dysphagia, unspecified: Secondary | ICD-10-CM

## 2017-12-17 ENCOUNTER — Other Ambulatory Visit: Payer: Self-pay | Admitting: Student

## 2017-12-17 DIAGNOSIS — R1310 Dysphagia, unspecified: Secondary | ICD-10-CM

## 2017-12-17 DIAGNOSIS — R1013 Epigastric pain: Secondary | ICD-10-CM

## 2017-12-18 ENCOUNTER — Other Ambulatory Visit: Payer: Self-pay | Admitting: Student

## 2017-12-18 DIAGNOSIS — R1310 Dysphagia, unspecified: Secondary | ICD-10-CM

## 2017-12-18 DIAGNOSIS — Z8673 Personal history of transient ischemic attack (TIA), and cerebral infarction without residual deficits: Secondary | ICD-10-CM

## 2017-12-25 ENCOUNTER — Other Ambulatory Visit: Payer: Self-pay | Admitting: Internal Medicine

## 2017-12-26 ENCOUNTER — Telehealth: Payer: Self-pay | Admitting: Internal Medicine

## 2017-12-26 MED ORDER — MONTELUKAST SODIUM 10 MG PO TABS: 10.0000 mg | ORAL_TABLET | Freq: Every day | ORAL | 3 refills | Status: DC

## 2017-12-26 NOTE — Telephone Encounter (Signed)
[facility] is calling in regards to montelukast (SINGULAIR) medication Needs clarification on quantity and number of refills  Please call [phone] Option 3

## 2017-12-31 ENCOUNTER — Ambulatory Visit
Admission: RE | Admit: 2017-12-31 | Discharge: 2017-12-31 | Disposition: A | Discharge status: Home / Self Care | Payer: 59 | Source: Ambulatory Visit | Attending: Nurse Practitioner | Admitting: Nurse Practitioner

## 2017-12-31 DIAGNOSIS — Z1231 Encounter for screening mammogram for malignant neoplasm of breast: Secondary | ICD-10-CM | POA: Diagnosis present

## 2018-01-01 NOTE — Telephone Encounter (Signed)
Verified Singulair rx which was re-submitted on 12/26/17. Nothing further needed.

## 2018-01-01 NOTE — Telephone Encounter (Signed)
[facility] is calling for clarification Please contact [phone] Choose option 3

## 2018-01-02 ENCOUNTER — Emergency Department
Admission: EM | Admit: 2018-01-02 | Discharge: 2018-01-02 | Disposition: A | Discharge status: Home / Self Care | Payer: 59 | Attending: Student in an Organized Health Care Education/Training Program | Admitting: Student in an Organized Health Care Education/Training Program

## 2018-01-02 ENCOUNTER — Other Ambulatory Visit: Payer: Self-pay

## 2018-01-02 ENCOUNTER — Emergency Department: Payer: 59

## 2018-01-02 ENCOUNTER — Encounter: Payer: Self-pay | Admitting: Emergency Medicine

## 2018-01-02 DIAGNOSIS — Z96651 Presence of right artificial knee joint: Secondary | ICD-10-CM | POA: Diagnosis not present

## 2018-01-02 DIAGNOSIS — E119 Type 2 diabetes mellitus without complications: Secondary | ICD-10-CM | POA: Diagnosis not present

## 2018-01-02 DIAGNOSIS — Z7982 Long term (current) use of aspirin: Secondary | ICD-10-CM | POA: Insufficient documentation

## 2018-01-02 DIAGNOSIS — J45909 Unspecified asthma, uncomplicated: Secondary | ICD-10-CM | POA: Insufficient documentation

## 2018-01-02 DIAGNOSIS — M7918 Myalgia, other site: Secondary | ICD-10-CM | POA: Diagnosis not present

## 2018-01-02 DIAGNOSIS — Z794 Long term (current) use of insulin: Secondary | ICD-10-CM | POA: Diagnosis not present

## 2018-01-02 DIAGNOSIS — Z79899 Other long term (current) drug therapy: Secondary | ICD-10-CM | POA: Diagnosis not present

## 2018-01-02 DIAGNOSIS — I10 Essential (primary) hypertension: Secondary | ICD-10-CM | POA: Insufficient documentation

## 2018-01-02 MED ORDER — CYCLOBENZAPRINE HCL 10 MG PO TABS
10.0000 mg | ORAL_TABLET | Freq: Once | ORAL | Status: AC
  Administered 2018-01-02: 10 mg via ORAL
  Filled 2018-01-02: qty 1

## 2018-01-02 MED ORDER — KETOROLAC TROMETHAMINE 60 MG/2ML IM SOLN
60.0000 mg | Freq: Once | INTRAMUSCULAR | Status: AC
  Administered 2018-01-02: 60 mg via INTRAMUSCULAR
  Filled 2018-01-02: qty 2

## 2018-01-02 MED ORDER — CYCLOBENZAPRINE HCL 10 MG PO TABS: 10.0000 mg | ORAL_TABLET | Freq: Three times a day (TID) | ORAL | 0 refills | Status: DC | PRN

## 2018-01-02 MED ORDER — KETOROLAC TROMETHAMINE 10 MG PO TABS: 10.0000 mg | ORAL_TABLET | Freq: Four times a day (QID) | ORAL | 0 refills | Status: DC | PRN

## 2018-01-02 MED ORDER — ONDANSETRON 8 MG PO TBDP
8.0000 mg | ORAL_TABLET | Freq: Once | ORAL | Status: AC
  Administered 2018-01-02: 8 mg via ORAL
  Filled 2018-01-02: qty 1

## 2018-01-02 MED ORDER — TRAMADOL HCL 50 MG PO TABS: 50.0000 mg | ORAL_TABLET | Freq: Two times a day (BID) | ORAL | 0 refills | Status: DC | PRN

## 2018-01-02 NOTE — Discharge Instructions (Addendum)
Read and follow discharge care instructions.  Take medication as directed. 
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
## 2018-01-09 ENCOUNTER — Ambulatory Visit: Payer: 59

## 2018-01-12 ENCOUNTER — Other Ambulatory Visit: Payer: 59

## 2018-01-22 ENCOUNTER — Other Ambulatory Visit: Payer: Self-pay | Admitting: Orthopedic Surgery

## 2018-01-22 DIAGNOSIS — M5030 Other cervical disc degeneration, unspecified cervical region: Secondary | ICD-10-CM

## 2018-01-22 DIAGNOSIS — M5412 Radiculopathy, cervical region: Secondary | ICD-10-CM

## 2018-01-29 ENCOUNTER — Ambulatory Visit
Admission: RE | Admit: 2018-01-29 | Discharge: 2018-01-29 | Disposition: A | Discharge status: Home / Self Care | Payer: 59 | Source: Ambulatory Visit | Attending: Orthopedic Surgery | Admitting: Orthopedic Surgery

## 2018-01-29 DIAGNOSIS — M5010 Cervical disc disorder with radiculopathy, unspecified cervical region: Secondary | ICD-10-CM | POA: Insufficient documentation

## 2018-01-29 DIAGNOSIS — M5030 Other cervical disc degeneration, unspecified cervical region: Secondary | ICD-10-CM

## 2018-01-29 DIAGNOSIS — M5412 Radiculopathy, cervical region: Secondary | ICD-10-CM | POA: Diagnosis present

## 2018-02-12 ENCOUNTER — Emergency Department
Admission: EM | Admit: 2018-02-12 | Discharge: 2018-02-13 | Disposition: A | Discharge status: Home / Self Care | Payer: 59 | Attending: Emergency Medicine | Admitting: Emergency Medicine

## 2018-02-12 ENCOUNTER — Encounter: Payer: Self-pay | Admitting: Emergency Medicine

## 2018-02-12 DIAGNOSIS — Z96651 Presence of right artificial knee joint: Secondary | ICD-10-CM | POA: Diagnosis not present

## 2018-02-12 DIAGNOSIS — J45909 Unspecified asthma, uncomplicated: Secondary | ICD-10-CM | POA: Diagnosis not present

## 2018-02-12 DIAGNOSIS — I10 Essential (primary) hypertension: Secondary | ICD-10-CM | POA: Diagnosis not present

## 2018-02-12 DIAGNOSIS — Z794 Long term (current) use of insulin: Secondary | ICD-10-CM | POA: Insufficient documentation

## 2018-02-12 DIAGNOSIS — Z79899 Other long term (current) drug therapy: Secondary | ICD-10-CM | POA: Insufficient documentation

## 2018-02-12 DIAGNOSIS — M542 Cervicalgia: Secondary | ICD-10-CM

## 2018-02-12 DIAGNOSIS — Z7982 Long term (current) use of aspirin: Secondary | ICD-10-CM | POA: Insufficient documentation

## 2018-02-12 DIAGNOSIS — M5412 Radiculopathy, cervical region: Secondary | ICD-10-CM | POA: Diagnosis not present

## 2018-02-12 DIAGNOSIS — E119 Type 2 diabetes mellitus without complications: Secondary | ICD-10-CM | POA: Diagnosis not present

## 2018-02-12 MED ORDER — CYCLOBENZAPRINE HCL 5 MG PO TABS: ORAL_TABLET | ORAL | 0 refills | Status: DC

## 2018-02-12 MED ORDER — OXYCODONE-ACETAMINOPHEN 5-325 MG PO TABS
1.0000 | ORAL_TABLET | Freq: Once | ORAL | Status: AC
  Administered 2018-02-12: 1 via ORAL
  Filled 2018-02-12: qty 1

## 2018-02-12 MED ORDER — KETOROLAC TROMETHAMINE 30 MG/ML IJ SOLN
30.0000 mg | Freq: Once | INTRAMUSCULAR | Status: AC
  Administered 2018-02-12: 30 mg via INTRAMUSCULAR
  Filled 2018-02-12: qty 1

## 2018-02-12 MED ORDER — MELOXICAM 7.5 MG PO TABS: 7.5000 mg | ORAL_TABLET | Freq: Every day | ORAL | 0 refills | Status: DC

## 2018-02-12 MED ORDER — LIDOCAINE 5 % EX PTCH
1.0000 | MEDICATED_PATCH | Freq: Once | CUTANEOUS | Status: DC
  Administered 2018-02-12: 1 via TRANSDERMAL
  Filled 2018-02-12: qty 1
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
## 2018-03-28 ENCOUNTER — Emergency Department: Payer: 59

## 2018-03-28 ENCOUNTER — Observation Stay
Admission: EM | Admit: 2018-03-28 | Discharge: 2018-03-30 | Disposition: A | Discharge status: Home / Self Care | Payer: 59 | Attending: Internal Medicine | Admitting: Internal Medicine

## 2018-03-28 ENCOUNTER — Other Ambulatory Visit: Payer: Self-pay

## 2018-03-28 DIAGNOSIS — G4730 Sleep apnea, unspecified: Secondary | ICD-10-CM | POA: Diagnosis not present

## 2018-03-28 DIAGNOSIS — R600 Localized edema: Secondary | ICD-10-CM | POA: Insufficient documentation

## 2018-03-28 DIAGNOSIS — Z7982 Long term (current) use of aspirin: Secondary | ICD-10-CM | POA: Diagnosis not present

## 2018-03-28 DIAGNOSIS — I10 Essential (primary) hypertension: Secondary | ICD-10-CM | POA: Diagnosis not present

## 2018-03-28 DIAGNOSIS — M797 Fibromyalgia: Secondary | ICD-10-CM | POA: Diagnosis not present

## 2018-03-28 DIAGNOSIS — R0789 Other chest pain: Principal | ICD-10-CM | POA: Insufficient documentation

## 2018-03-28 DIAGNOSIS — I313 Pericardial effusion (noninflammatory): Secondary | ICD-10-CM | POA: Diagnosis not present

## 2018-03-28 DIAGNOSIS — F329 Major depressive disorder, single episode, unspecified: Secondary | ICD-10-CM | POA: Diagnosis not present

## 2018-03-28 DIAGNOSIS — R079 Chest pain, unspecified: Secondary | ICD-10-CM | POA: Diagnosis present

## 2018-03-28 DIAGNOSIS — Z8673 Personal history of transient ischemic attack (TIA), and cerebral infarction without residual deficits: Secondary | ICD-10-CM | POA: Insufficient documentation

## 2018-03-28 DIAGNOSIS — Z794 Long term (current) use of insulin: Secondary | ICD-10-CM | POA: Insufficient documentation

## 2018-03-28 DIAGNOSIS — E119 Type 2 diabetes mellitus without complications: Secondary | ICD-10-CM | POA: Diagnosis not present

## 2018-03-28 DIAGNOSIS — I209 Angina pectoris, unspecified: Secondary | ICD-10-CM | POA: Diagnosis present

## 2018-03-28 DIAGNOSIS — Z880 Allergy status to penicillin: Secondary | ICD-10-CM | POA: Diagnosis not present

## 2018-03-28 DIAGNOSIS — R0602 Shortness of breath: Secondary | ICD-10-CM | POA: Insufficient documentation

## 2018-03-28 DIAGNOSIS — K219 Gastro-esophageal reflux disease without esophagitis: Secondary | ICD-10-CM | POA: Insufficient documentation

## 2018-03-28 DIAGNOSIS — Z79899 Other long term (current) drug therapy: Secondary | ICD-10-CM | POA: Diagnosis not present

## 2018-03-28 HISTORY — DX: Cerebral infarction, unspecified: I63.9

## 2018-03-28 LAB — TROPONIN I
Troponin I: <0.03 ng/mL (ref <0.03)
Troponin I: <0.03 ng/mL (ref <0.03)

## 2018-03-28 LAB — CBC
HCT: 38 % (ref 35.0–47.0)
Hemoglobin: 12.8 g/dL (ref 12.0–16.0)
MCH: 30.6 pg (ref 26.0–34.0)
MCHC: 33.7 g/dL (ref 32.0–36.0)
MCV: 90.7 fL (ref 80.0–100.0)
Platelets: 231 10*3/uL (ref 150–440)
RBC: 4.19 MIL/uL (ref 3.80–5.20)
RDW: 15.1 % — ABNORMAL HIGH (ref 11.5–14.5)
WBC: 4.7 10*3/uL (ref 3.6–11.0)

## 2018-03-28 LAB — BASIC METABOLIC PANEL
Anion gap: 10 (ref 5–15)
BUN: 8 mg/dL (ref 6–20)
CO2: 31 mmol/L (ref 22–32)
Calcium: 9.6 mg/dL (ref 8.9–10.3)
Chloride: 101 mmol/L (ref 98–111)
Creatinine, Ser: 0.8 mg/dL (ref 0.44–1.00)
GFR calc Af Amer: >60 mL/min (ref >60)
GFR calc non Af Amer: >60 mL/min (ref >60)
Glucose, Bld: 165 mg/dL — ABNORMAL HIGH (ref 70–99)
Potassium: 3.8 mmol/L (ref 3.5–5.1)
Sodium: 142 mmol/L (ref 135–145)

## 2018-03-28 LAB — BRAIN NATRIURETIC PEPTIDE: B Natriuretic Peptide: 11 pg/mL (ref 0.0–100.0)

## 2018-03-28 MED ORDER — OXYCODONE HCL 5 MG PO TABS
5.0000 mg | ORAL_TABLET | Freq: Four times a day (QID) | ORAL | Status: DC | PRN
  Administered 2018-03-29 – 2018-03-30 (×2): 5 mg via ORAL
  Filled 2018-03-28 (×2): qty 1

## 2018-03-28 MED ORDER — ONDANSETRON HCL 4 MG PO TABS: 4.0000 mg | ORAL_TABLET | Freq: Four times a day (QID) | ORAL | Status: DC | PRN

## 2018-03-28 MED ORDER — METFORMIN HCL 500 MG PO TABS
1000.0000 mg | ORAL_TABLET | Freq: Two times a day (BID) | ORAL | Status: DC
Start: 2018-03-29 — End: 2018-03-30
  Administered 2018-03-29 – 2018-03-30 (×3): 1000 mg via ORAL
  Filled 2018-03-28 (×3): qty 2

## 2018-03-28 MED ORDER — INSULIN ASPART 100 UNIT/ML ~~LOC~~ SOLN: 0.0000 [IU] | Freq: Every day | SUBCUTANEOUS | Status: DC

## 2018-03-28 MED ORDER — VITAMIN C 500 MG PO TABS
1000.0000 mg | ORAL_TABLET | Freq: Every day | ORAL | Status: DC
  Administered 2018-03-28 – 2018-03-30 (×3): 1000 mg via ORAL
  Filled 2018-03-28 (×3): qty 2

## 2018-03-28 MED ORDER — FLUTICASONE FUROATE-VILANTEROL 200-25 MCG/INH IN AEPB
1.0000 | INHALATION_SPRAY | Freq: Every day | RESPIRATORY_TRACT | Status: DC
  Administered 2018-03-28 – 2018-03-30 (×3): 1 via RESPIRATORY_TRACT
  Filled 2018-03-28: qty 28

## 2018-03-28 MED ORDER — CANAGLIFLOZIN 100 MG PO TABS: 300.0000 mg | ORAL_TABLET | Freq: Every day | ORAL | Status: DC

## 2018-03-28 MED ORDER — ENOXAPARIN SODIUM 40 MG/0.4ML ~~LOC~~ SOLN
40.0000 mg | SUBCUTANEOUS | Status: DC
  Administered 2018-03-28 – 2018-03-29 (×2): 40 mg via SUBCUTANEOUS
  Filled 2018-03-28 (×2): qty 0.4

## 2018-03-28 MED ORDER — INSULIN DETEMIR 100 UNIT/ML ~~LOC~~ SOLN
12.0000 [IU] | Freq: Every day | SUBCUTANEOUS | Status: DC
  Administered 2018-03-28 – 2018-03-29 (×2): 12 [IU] via SUBCUTANEOUS
  Filled 2018-03-28 (×3): qty 0.12

## 2018-03-28 MED ORDER — ACETAMINOPHEN 325 MG PO TABS
650.0000 mg | ORAL_TABLET | Freq: Four times a day (QID) | ORAL | Status: DC | PRN
  Administered 2018-03-28 – 2018-03-29 (×2): 650 mg via ORAL
  Filled 2018-03-28 (×2): qty 2

## 2018-03-28 MED ORDER — LINACLOTIDE 290 MCG PO CAPS
290.0000 ug | ORAL_CAPSULE | Freq: Every day | ORAL | Status: DC
Start: 2018-03-28 — End: 2018-03-30
  Administered 2018-03-28 – 2018-03-30 (×3): 290 ug via ORAL
  Filled 2018-03-28 (×3): qty 1

## 2018-03-28 MED ORDER — GABAPENTIN 300 MG PO CAPS
600.0000 mg | ORAL_CAPSULE | Freq: Two times a day (BID) | ORAL | Status: DC
  Administered 2018-03-28 – 2018-03-30 (×4): 600 mg via ORAL
  Filled 2018-03-28 (×4): qty 2

## 2018-03-28 MED ORDER — LIDOCAINE 5 % EX PTCH
1.0000 | MEDICATED_PATCH | Freq: Every day | CUTANEOUS | Status: DC
  Administered 2018-03-28 – 2018-03-30 (×3): 1 via TRANSDERMAL
  Filled 2018-03-28 (×3): qty 1

## 2018-03-28 MED ORDER — FUROSEMIDE 10 MG/ML IJ SOLN
40.0000 mg | Freq: Two times a day (BID) | INTRAMUSCULAR | Status: DC
  Filled 2018-03-28: qty 4

## 2018-03-28 MED ORDER — ONDANSETRON HCL 4 MG/2ML IJ SOLN: 4.0000 mg | Freq: Four times a day (QID) | INTRAMUSCULAR | Status: DC | PRN

## 2018-03-28 MED ORDER — ACETAMINOPHEN 650 MG RE SUPP: 650.0000 mg | Freq: Four times a day (QID) | RECTAL | Status: DC | PRN

## 2018-03-28 MED ORDER — ALBUTEROL SULFATE (2.5 MG/3ML) 0.083% IN NEBU
3.0000 mL | INHALATION_SOLUTION | RESPIRATORY_TRACT | Status: DC | PRN
  Administered 2018-03-29: 3 mL via RESPIRATORY_TRACT

## 2018-03-28 MED ORDER — ISOSORBIDE MONONITRATE ER 30 MG PO TB24
30.0000 mg | ORAL_TABLET | Freq: Every day | ORAL | Status: DC
  Administered 2018-03-28 – 2018-03-30 (×3): 30 mg via ORAL
  Filled 2018-03-28 (×3): qty 1

## 2018-03-28 MED ORDER — MORPHINE SULFATE (PF) 2 MG/ML IV SOLN
2.0000 mg | INTRAVENOUS | Status: DC | PRN
  Administered 2018-03-28 – 2018-03-29 (×2): 2 mg via INTRAVENOUS
  Filled 2018-03-28 (×2): qty 1

## 2018-03-28 MED ORDER — ASPIRIN EC 81 MG PO TBEC
81.0000 mg | DELAYED_RELEASE_TABLET | Freq: Every day | ORAL | Status: DC
  Administered 2018-03-28 – 2018-03-30 (×3): 81 mg via ORAL
  Filled 2018-03-28 (×3): qty 1

## 2018-03-28 MED ORDER — POTASSIUM CHLORIDE CRYS ER 20 MEQ PO TBCR
40.0000 meq | EXTENDED_RELEASE_TABLET | Freq: Every day | ORAL | Status: AC
  Administered 2018-03-28 – 2018-03-29 (×2): 40 meq via ORAL
  Filled 2018-03-28 (×2): qty 2

## 2018-03-28 MED ORDER — MELOXICAM 7.5 MG PO TABS
15.0000 mg | ORAL_TABLET | Freq: Every day | ORAL | Status: DC
  Administered 2018-03-28 – 2018-03-30 (×3): 15 mg via ORAL
  Filled 2018-03-28 (×3): qty 2

## 2018-03-28 MED ORDER — NITROGLYCERIN 0.4 MG SL SUBL
0.4000 mg | SUBLINGUAL_TABLET | SUBLINGUAL | Status: DC | PRN
  Administered 2018-03-28 (×3): 0.4 mg via SUBLINGUAL
  Filled 2018-03-28: qty 1

## 2018-03-28 MED ORDER — INSULIN ASPART 100 UNIT/ML ~~LOC~~ SOLN
0.0000 [IU] | Freq: Three times a day (TID) | SUBCUTANEOUS | Status: DC
  Administered 2018-03-29 (×3): 1 [IU] via SUBCUTANEOUS
  Administered 2018-03-30: 2 [IU] via SUBCUTANEOUS
  Filled 2018-03-28 (×4): qty 1

## 2018-03-28 NOTE — ED Triage Notes (Signed)
Pt states swelling in feet and leg x 1 week. Denies hx of CHF but states she sees a cardiologist. Also states central CP and "bulging disc in neck that's hurting" c/o of numbness in R arm from that. State CP began last week.

## 2018-03-28 NOTE — ED Notes (Signed)
Patient taken to ultrasound.

## 2018-03-28 NOTE — Progress Notes (Signed)
PHARMACIST - PHYSICIAN ORDER COMMUNICATION  CONCERNING: P&T Medication Policy   DESCRIPTION:  This patient's order for:  Canagliflozin (Invokana) 300mg   has been noted.  The Pharmacy and Therapeutics Committee does not permit the use this product at [facility].   ACTION TAKEN: The pharmacy department has discontinued this product per [facility] policy.   Gardner CandleSheema M Nephtali Docken, PharmD, BCPS Clinical Pharmacist 03/28/2018 9:48 PM

## 2018-03-28 NOTE — Progress Notes (Addendum)
Pt blood sugar 108 but glucometer not sinking yet. Will continue to monitor.  Update 2239: Pt complaining of 10 out 10 chest pain. Emergency standing order nitro tablet 0.4 mg every 5 mins PRN was placed. Will continue to monitor.   Update 2258. Pt chest pain went down to 6 after nitro tablet 0.4 mg x 3. Notfiy prime. Will continue to monitor.  Update 2301: Docotor Willis ordered morphine 2 mg IV every 4 hours for severe pain and oxycodone  5 mg tablet every 6 hours for moderate pain. Will continue to monitor.  Update 0045: After giving the morphine to pt. Pt was resting at this time and  pain went down to 4 ( target goal for pain as per pt).

## 2018-03-28 NOTE — H&P (Signed)
[facility] [place] [facility]   PATIENT NAME: Amanda [name]    MR#:  [id number]  DATE OF BIRTH:  03/05/1966  DATE OF ADMISSION:  03/28/2018  PRIMARY CARE PHYSICIAN: Margaretann Loveless, MD   REQUESTING/REFERRING PHYSICIAN: Dr. Dorothea Glassman  CHIEF COMPLAINT:   Chief Complaint  Patient presents with  . Chest Pain  . Leg Swelling    HISTORY OF PRESENT ILLNESS:  Amanda [name]  is a 52 y.o. female with a known history of arthritis, fibromyalgia, non-insulin-dependent diabetes mellitus, hypertension and history of minor stroke without any residual neurological deficits presents to hospital secondary to chest pain. Patient had blood work done about couple of weeks ago, she was noted to be dehydrated and was advised to drink plenty of fluids. She denies any increased salt usage. She has noticed for the last two weeks that her legs were swollen. Also complaining of chest tightness. Initially it was intermittent with exertion only, has become more constant and worsening with exertion in the last few days. Denies any changes with position. No chest wall tenderness. Complaints of blood pressure like pain in the chest associated with nausea. Denies any sweating. No fevers or chills. No upper respiratory tract infection symptoms. First troponin is negative. EKG without any significant ST T-wave changes.  PAST MEDICAL HISTORY:   Past Medical History:  Diagnosis Date  . Arthritis    knees, shoulder, upper back  . Asthma   . Diabetes mellitus without complication (HCC)   . Environmental allergies   . Fibromyalgia   . GERD (gastroesophageal reflux disease)   . Headache    migraines - 5x/mo  . Hypertension   . Hypokalemia 10/14/2017  . Migraines   . Motion sickness    ships  . Sleep apnea   . Stroke [facility])    no residual deficits  . Thyroid nodule    bilateral and goiter  . Wears contact lenses   . Wears dentures    partial upper    PAST SURGICAL HISTORY:   Past  Surgical History:  Procedure Laterality Date  . ABDOMINAL HYSTERECTOMY  2000's  . ABDOMINAL SURGERY     laparoscopy x4 with lysis of adhesions  . APPENDECTOMY  1986  . CESAREAN SECTION  1992  . CHOLECYSTECTOMY N/A 04/03/2017   Procedure: LAPAROSCOPIC CHOLECYSTECTOMY;  Surgeon: Henrene Dodge, MD;  Location: [facility] ORS;  Service: General;  Laterality: N/A;  . COLONOSCOPY WITH PROPOFOL N/A 03/10/2017   Procedure: COLONOSCOPY WITH PROPOFOL;  Surgeon: Scot Jun, MD;  Location: [facility] ENDOSCOPY;  Service: Endoscopy;  Laterality: N/A;  . ECTOPIC PREGNANCY SURGERY  2000's  . ESOPHAGOGASTRODUODENOSCOPY (EGD) WITH PROPOFOL N/A 03/10/2017   Procedure: ESOPHAGOGASTRODUODENOSCOPY (EGD) WITH PROPOFOL;  Surgeon: Scot Jun, MD;  Location: [facility] ENDOSCOPY;  Service: Endoscopy;  Laterality: N/A;  . HERNIA REPAIR    . JOINT REPLACEMENT Right 12/16/12   knee- medial - makoplasty  . KNEE ARTHROSCOPY Right 2006   partial medial and lateral meniscectomies  . KNEE ARTHROSCOPY Right 10/06/2015   Procedure: RIGHT KNEE ARTHROSCOPY WITH DEBRIDEMENT;  Surgeon: Erin Sons, MD;  [facility];  Service: Orthopedics;  Laterality: Right;  Diabetic - insulin and oral meds  . ROTATOR CUFF REPAIR Left 2006  . TUBAL LIGATION  2000's    SOCIAL HISTORY:   Social History   Tobacco Use  . Smoking status: Never Smoker  . Smokeless tobacco: Never Used  Substance Use Topics  . Alcohol use: No  FAMILY HISTORY:   Family History  Problem Relation Age of Onset  . Hypertension Mother   . Hypertension Father   . Diabetes Father   . Allergies Father   . Breast cancer Neg Hx     DRUG ALLERGIES:   Allergies  Allergen Reactions  . Penicillins Anaphylaxis    Has patient had a PCN reaction causing immediate rash, facial/tongue/throat swelling, SOB or lightheadedness with hypotension: Yes Has patient had a PCN reaction causing severe rash involving mucus membranes or skin necrosis: No Has  patient had a PCN reaction that required hospitalization No Has patient had a PCN reaction occurring within the last 10 years: No If all of the above answers are "NO", then may proceed with Cephalosporin use.   . Ace Inhibitors Swelling    REVIEW OF SYSTEMS:   Review of Systems  Constitutional: Negative for chills, fever, malaise/fatigue and weight loss.  HENT: Negative for ear discharge, ear pain, hearing loss, nosebleeds and tinnitus.   Eyes: Negative for blurred vision, double vision and photophobia.  Respiratory: Negative for cough, hemoptysis, shortness of breath and wheezing.   Cardiovascular: Positive for chest pain. Negative for palpitations, orthopnea and leg swelling.  Gastrointestinal: Positive for nausea. Negative for abdominal pain, constipation, diarrhea, heartburn, melena and vomiting.  Genitourinary: Negative for dysuria, frequency, hematuria and urgency.  Musculoskeletal: Positive for back pain, myalgias and neck pain.  Skin: Negative for rash.  Neurological: Negative for dizziness, tingling, tremors, sensory change, speech change, focal weakness and headaches.  Endo/Heme/Allergies: Does not bruise/bleed easily.  Psychiatric/Behavioral: Negative for depression.    MEDICATIONS AT HOME:   Prior to Admission medications   Medication Sig Start Date End Date Taking? Authorizing Provider  albuterol (PROVENTIL HFA) 108 (90 Base) MCG/ACT inhaler Inhale 2 puffs into the lungs every 4 (four) hours as needed for wheezing or shortness of breath. 11/06/17  Yes Shane Crutch, MD  Ascorbic Acid (VITAMIN C) 1000 MG tablet Take 1,000 mg by mouth daily.   Yes [provider]  aspirin EC 81 MG tablet Take 1 tablet (81 mg total) by mouth daily. 10/17/17  Yes Johnson, Clanford L, MD  BREO ELLIPTA 200-25 MCG/INH AEPB Inhale 1 puff into the lungs daily. 12/25/17  Yes Shane Crutch, MD  canagliflozin (INVOKANA) 300 MG TABS tablet Take 300 mg by mouth daily before  breakfast.   Yes [provider]  flunisolide (NASALIDE) 25 MCG/ACT (0.025%) SOLN Place 2 sprays into the nose daily as needed.   Yes [provider]  furosemide (LASIX) 40 MG tablet Take 40 mg by mouth daily.   Yes [provider]  gabapentin (NEURONTIN) 300 MG capsule Take 600 mg by mouth 2 (two) times daily. 03/22/18  Yes [provider]  insulin detemir (LEVEMIR) 100 UNIT/ML injection Inject 12 Units into the skin at bedtime.    Yes [provider]  ipratropium-albuterol (DUONEB) 0.5-2.5 (3) MG/3ML SOLN Inhale contents of 1 vial via nebulizer 3 times daily as needed for shortness of breath/wheeze/cough. 11/21/17  Yes Shane Crutch, MD  isosorbide mononitrate (IMDUR) 30 MG 24 hr tablet Take 1 tablet (30 mg total) by mouth daily. 10/17/17  Yes Johnson, Clanford L, MD  lidocaine (LIDODERM) 5 % Place 1 patch onto the skin daily. (remove after 12 hours and apply new patch 12 hours later) 02/24/18  Yes [provider]  LINZESS 290 MCG CAPS capsule Take 1 tablet by mouth daily. 02/28/17  Yes [provider]  meloxicam (MOBIC) 7.5 MG tablet Take 1  tablet (7.5 mg total) by mouth daily. Patient taking differently: Take 15 mg by mouth daily.  02/12/18 02/12/19 Yes Enid Derry, PA-C  metFORMIN (GLUCOPHAGE) 1000 MG tablet Take 1,000 mg by mouth 2 (two) times daily with a meal.   Yes [provider]  metoprolol succinate (TOPROL-XL) 50 MG 24 hr tablet Take 50 mg by mouth daily. Take with or immediately following a meal.   Yes [provider]  mirtazapine (REMERON SOL-TAB) 30 MG disintegrating tablet Take 30 mg by mouth at bedtime. 03/18/18  Yes [provider]  montelukast (SINGULAIR) 10 MG tablet Take 1 tablet (10 mg total) by mouth daily. 12/26/17 12/26/18 Yes Shane Crutch, MD  Multiple Vitamin (MULTIVITAMIN) tablet Take 1 tablet by mouth daily.   Yes [provider]  omeprazole (PRILOSEC) 40 MG  capsule Take 1 capsule (40 mg total) by mouth daily. 1 hr before supper 04/05/17  Yes Piscoya, Jose, MD  oxyCODONE (OXY IR/ROXICODONE) 5 MG immediate release tablet Take 5 mg by mouth every 4 (four) hours as needed for pain. 02/23/18  Yes [provider]  Potassium Chloride ER 20 MEQ TBCR Take 20 mEq by mouth daily. 03/11/18  Yes [provider]  pravastatin (PRAVACHOL) 80 MG tablet Take 1 tablet (80 mg total) by mouth daily. Patient taking differently: Take 40 mg by mouth daily.  10/17/17 03/28/18 Yes Johnson, Clanford L, MD  sucralfate (CARAFATE) 1 g tablet Take 1 g by mouth 2 (two) times daily.    Yes [provider]  SUMAtriptan (IMITREX) 25 MG tablet Take 25 mg by mouth every 2 (two) hours as needed for migraine. May repeat in 2 hours if headache persists or recurs.   Yes [provider]  tiotropium (SPIRIVA HANDIHALER) 18 MCG inhalation capsule Place 1 capsule (18 mcg total) into inhaler and inhale daily. 11/06/17 11/06/18 Yes Shane Crutch, MD  tiZANidine (ZANAFLEX) 4 MG tablet Take 4 mg by mouth every 6 (six) hours as needed for muscle spasms.   Yes [provider]  zolpidem (AMBIEN) 5 MG tablet Take 5 mg by mouth at bedtime.    Yes [provider]  cyclobenzaprine (FLEXERIL) 5 MG tablet Take 1-2 tablets 3 times daily as needed Patient not taking: Reported on 03/28/2018 02/12/18   Enid Derry, PA-C  ketorolac (TORADOL) 10 MG tablet Take 1 tablet (10 mg total) by mouth every 6 (six) hours as needed. Patient not taking: Reported on 03/28/2018 01/02/18   Joni Reining, PA-C  polyethylene glycol [facility] / GLYCOLAX) packet Take 17 g by mouth daily. Mix one tablespoon with 8oz of your favorite juice or water every day until you are having soft formed stools. Then start taking once daily if you didn't have a stool the day before. Patient not taking: Reported on 03/28/2018 04/17/17   Willy Eddy, MD  promethazine (PHENERGAN) 25 MG tablet Take 1  tablet (25 mg total) by mouth every 6 (six) hours as needed for nausea or vomiting. Patient not taking: Reported on 03/28/2018 10/10/17   Jene Every, MD  protein supplement shake (PREMIER PROTEIN) LIQD Take 325 mLs (11 oz total) by mouth 3 (three) times daily between meals. Patient not taking: Reported on 03/28/2018 04/05/17   Henrene Dodge, MD  traMADol (ULTRAM) 50 MG tablet Take 1 tablet (50 mg total) by mouth every 12 (twelve) hours as needed. Patient not taking: Reported on 03/28/2018 01/02/18   Joni Reining, PA-C      VITAL SIGNS:  Blood pressure (!) 160/104,  pulse 86, temperature 98.3 F (36.8 C), temperature source Oral, resp. rate 20, height 5\' 5"  (1.651 m), weight 84.8 kg (187 lb), SpO2 97 %.  PHYSICAL EXAMINATION:   Physical Exam  GENERAL:  52 y.o.-year-old patient lying in the bed with no acute distress.  EYES: Pupils equal, round, reactive to light and accommodation. No scleral icterus. Extraocular muscles intact.  HEENT: Head atraumatic, normocephalic. Oropharynx and nasopharynx clear.  NECK:  Supple, no jugular venous distention. No thyroid enlargement, no tenderness.  LUNGS: Normal breath sounds bilaterally, no wheezing, rales,rhonchi or crepitation. No use of accessory muscles of respiration.  CARDIOVASCULAR: S1, S2 normal. No murmurs, rubs, or gallops. No chest wall tenderness noted. ABDOMEN: Soft, nontender, nondistended. Bowel sounds present. No organomegaly or mass.  EXTREMITIES: No pedal edema, cyanosis, or clubbing.  NEUROLOGIC: Cranial nerves II through XII are intact. Muscle strength 5/5 in all extremities. Sensation intact. Gait not checked.  PSYCHIATRIC: The patient is alert and oriented x 3.  SKIN: No obvious rash, lesion, or ulcer.   LABORATORY PANEL:   CBC Recent Labs  Lab 03/28/18 1145  WBC 4.7  HGB 12.8  HCT 38.0  PLT 231    ------------------------------------------------------------------------------------------------------------------  Chemistries  Recent Labs  Lab 03/28/18 1145  NA 142  K 3.8  CL 101  CO2 31  GLUCOSE 165*  BUN 8  CREATININE 0.80  CALCIUM 9.6   ------------------------------------------------------------------------------------------------------------------  Cardiac Enzymes Recent Labs  Lab 03/28/18 1145  TROPONINI <0.03   ------------------------------------------------------------------------------------------------------------------  RADIOLOGY:  Dg Chest 2 View  Result Date: 03/28/2018 CLINICAL DATA:  Chest pain and shortness of breath EXAM: CHEST - 2 VIEW COMPARISON:  01/02/2018 FINDINGS: Cardiac shadow is stable. The lungs are well aerated bilaterally. No focal infiltrate or sizable effusion is seen. No bony abnormality is noted. IMPRESSION: No active cardiopulmonary disease. Electronically Signed   By: Alcide CleverMark  Lukens M.D.   On: 03/28/2018 12:49   [place] Venous Img Lower Bilateral  Result Date: 03/28/2018 CLINICAL DATA:  Bilateral edema EXAM: BILATERAL LOWER EXTREMITY VENOUS DOPPLER ULTRASOUND TECHNIQUE: Gray-scale sonography with graded compression, as well as color Doppler and duplex ultrasound were performed to evaluate the lower extremity deep venous systems from the level of the common femoral vein and including the common femoral, femoral, profunda femoral, popliteal and calf veins including the posterior tibial, peroneal and gastrocnemius veins when visible. The superficial great saphenous vein was also interrogated. Spectral Doppler was utilized to evaluate flow at rest and with distal augmentation maneuvers in the common femoral, femoral and popliteal veins. COMPARISON:  None. FINDINGS: RIGHT LOWER EXTREMITY Common Femoral Vein: No evidence of thrombus. Normal compressibility, respiratory phasicity and response to augmentation. Saphenofemoral Junction: No evidence of  thrombus. Normal compressibility and flow on color Doppler imaging. Profunda Femoral Vein: No evidence of thrombus. Normal compressibility and flow on color Doppler imaging. Femoral Vein: No evidence of thrombus. Normal compressibility, respiratory phasicity and response to augmentation. Popliteal Vein: No evidence of thrombus. Normal compressibility, respiratory phasicity and response to augmentation. Calf Veins: No evidence of thrombus. Normal compressibility and flow on color Doppler imaging. Superficial Great Saphenous Vein: No evidence of thrombus. Normal compressibility. Venous Reflux:  None. Other Findings:  None. LEFT LOWER EXTREMITY Common Femoral Vein: No evidence of thrombus. Normal compressibility, respiratory phasicity and response to augmentation. Saphenofemoral Junction: No evidence of thrombus. Normal compressibility and flow on color Doppler imaging. Profunda Femoral Vein: No evidence of thrombus. Normal compressibility and flow on color Doppler imaging. Femoral Vein: No evidence of thrombus. Normal compressibility,  respiratory phasicity and response to augmentation. Popliteal Vein: No evidence of thrombus. Normal compressibility, respiratory phasicity and response to augmentation. Calf Veins: No evidence of thrombus. Normal compressibility and flow on color Doppler imaging. Superficial Great Saphenous Vein: No evidence of thrombus. Normal compressibility. Venous Reflux:  None. Other Findings:  None. IMPRESSION: No evidence of deep venous thrombosis. Electronically Signed   By: Charlett Nose M.D.   On: 03/28/2018 18:30    EKG:   Orders placed or performed during the hospital encounter of 03/28/18  . EKG 12-Lead  . EKG 12-Lead  . ED EKG within 10 minutes  . ED EKG within 10 minutes    IMPRESSION AND PLAN:   Amanda [name]  is a 52 y.o. female with a known history of arthritis, fibromyalgia, non-insulin-dependent diabetes mellitus, hypertension and history of minor stroke without any  residual neurological deficits presents to hospital secondary to chest pain.  1. Chest pain-could be secondary to congestive heart failure or unstable angina -will admit, recycle troponins. Monitor on telemetry -cardiology consulted. Check echocardiogram -if myoview needed-order for Monday or as outpatient. - nitro prn, asa  2. Diabetes mellitus-type II diabetes. Continue Levemir, sliding scale insulin. Also on invokana and metformin  3. Pedal edema-likely congestive heart failure. Chest x-ray clear without any pulmonary edema. Increase her Lasix to BID and changed to IV -follow-up echocardiogram. -Replace potassium appropriately  4. Fibromyalgia and also degenerative disc disease in the neck-follows with physiatrist -continue outpatient medications  5. DVT prophylaxis-on Lovenox   All the records are reviewed and case discussed with ED provider. Management plans discussed with the patient, family and they are in agreement.  CODE STATUS: full code  TOTAL TIME TAKING CARE OF THIS PATIENT: 50 minutes.    Enid Baas M.D on 03/28/2018 at 7:56 PM  Between 7am to 6pm - Pager - [phone]  After 6pm go to [email or web address] - password [email or web address]  [facility]  Office  [phone]  CC: Primary care physician; Margaretann Loveless, MD

## 2018-03-28 NOTE — ED Notes (Signed)
This RN made 2 unsuccessful attempts at PIV insertion. Elon JesterMichele RN to bedside to attempt PIV insertion.

## 2018-03-28 NOTE — ED Provider Notes (Signed)
[facility] Provider Note   ____________________________________________   First MD Initiated Contact with Patient 03/28/18 1626     (approximate)  I have reviewed the triage vital signs and the nursing notes.   HISTORY  Chief Complaint Chest Pain and Leg Swelling   HPI Amanda [name] is a 52 y.o. female who complains of chest tightness beginning last week.  She thinks her legs are swelling a little bit to for the last week.  She reports a chest tightness comes on with exertion goes away with rest.  She can walk about a block before the chest gets tight.  She has been out of walk further earlier in the weight than she has now.  She may be getting slightly short of breath 2.   Past Medical History:  Diagnosis Date  . Arthritis    knees, shoulder, upper back  . Asthma   . Diabetes mellitus without complication (HCC)   . Environmental allergies   . Fibromyalgia   . GERD (gastroesophageal reflux disease)   . Headache    migraines - 5x/mo  . Hypertension   . Hypokalemia 10/14/2017  . Migraines   . Motion sickness    ships  . Sleep apnea   . Thyroid nodule    bilateral and goiter  . Wears contact lenses   . Wears dentures    partial upper    Patient Active Problem List   Diagnosis Date Noted  . Hypertension 10/15/2017  . Wears dentures 10/15/2017  . Acute encephalopathy 10/14/2017  . Elevated blood pressure reading 10/14/2017  . Pneumonia 06/03/2017  . Symptomatic cholelithiasis 04/02/2017  . Recurrent incisional hernia 03/17/2017  . Calculus of gallbladder without cholecystitis without obstruction 03/17/2017  . Helicobacter pylori gastritis 03/17/2017  . Community acquired pneumonia 09/28/2016  . Depression 09/28/2016  . Diabetes mellitus, type 2 (HCC) 09/28/2016  . Nausea and vomiting 09/28/2016  . Acute respiratory failure with hypoxemia (HCC) 03/18/2016  . Acute right ankle pain 10/04/2014  . Esophageal reflux  06/22/2012    Past Surgical History:  Procedure Laterality Date  . ABDOMINAL HYSTERECTOMY  2000's  . ABDOMINAL SURGERY     laparoscopy x4 with lysis of adhesions  . APPENDECTOMY  1986  . CESAREAN SECTION  1992  . CHOLECYSTECTOMY N/A 04/03/2017   Procedure: LAPAROSCOPIC CHOLECYSTECTOMY;  Surgeon: Henrene Dodge, MD;  Location: [facility] ORS;  Service: General;  Laterality: N/A;  . COLONOSCOPY WITH PROPOFOL N/A 03/10/2017   Procedure: COLONOSCOPY WITH PROPOFOL;  Surgeon: Scot Jun, MD;  Location: [facility] ENDOSCOPY;  Service: Endoscopy;  Laterality: N/A;  . ECTOPIC PREGNANCY SURGERY  2000's  . ESOPHAGOGASTRODUODENOSCOPY (EGD) WITH PROPOFOL N/A 03/10/2017   Procedure: ESOPHAGOGASTRODUODENOSCOPY (EGD) WITH PROPOFOL;  Surgeon: Scot Jun, MD;  Location: [facility] ENDOSCOPY;  Service: Endoscopy;  Laterality: N/A;  . HERNIA REPAIR    . JOINT REPLACEMENT Right 12/16/12   knee- medial - makoplasty  . KNEE ARTHROSCOPY Right 2006   partial medial and lateral meniscectomies  . KNEE ARTHROSCOPY Right 10/06/2015   Procedure: RIGHT KNEE ARTHROSCOPY WITH DEBRIDEMENT;  Surgeon: Erin Sons, MD;  Location: [facility];  Service: Orthopedics;  Laterality: Right;  Diabetic - insulin and oral meds  . ROTATOR CUFF REPAIR Left 2006  . TUBAL LIGATION  2000's    Prior to Admission medications   Medication Sig Start Date End Date Taking? Authorizing Provider  albuterol (PROVENTIL HFA) 108 (90 Base) MCG/ACT inhaler Inhale 2 puffs into the lungs every 4 (  four) hours as needed for wheezing or shortness of breath. 11/06/17   Shane Crutch, MD  Ascorbic Acid (VITAMIN C) 1000 MG tablet Take 1,000 mg by mouth daily.    [provider]  aspirin EC 81 MG tablet Take 1 tablet (81 mg total) by mouth daily. 10/17/17   Johnson, Clanford L, MD  BREO ELLIPTA 200-25 MCG/INH AEPB Inhale 1 puff into the lungs daily. 12/25/17   Shane Crutch, MD  canagliflozin (INVOKANA) 300 MG TABS tablet Take 300 mg  by mouth daily before breakfast.    [provider]  cyclobenzaprine (FLEXERIL) 5 MG tablet Take 1-2 tablets 3 times daily as needed 02/12/18   Enid Derry, PA-C  flunisolide (NASALIDE) 25 MCG/ACT (0.025%) SOLN Place 2 sprays into the nose daily as needed.    [provider]  insulin detemir (LEVEMIR) 100 UNIT/ML injection Inject 10 Units into the skin at bedtime.    [provider]  ipratropium-albuterol (DUONEB) 0.5-2.5 (3) MG/3ML SOLN Inhale contents of 1 vial via nebulizer 3 times daily as needed for shortness of breath/wheeze/cough. 11/21/17   Shane Crutch, MD  isosorbide mononitrate (IMDUR) 30 MG 24 hr tablet Take 1 tablet (30 mg total) by mouth daily. 10/17/17   Johnson, Clanford L, MD  ketorolac (TORADOL) 10 MG tablet Take 1 tablet (10 mg total) by mouth every 6 (six) hours as needed. 01/02/18   Joni Reining, PA-C  LINZESS 290 MCG CAPS capsule Take 1 tablet by mouth daily. 02/28/17   [provider]  meloxicam (MOBIC) 7.5 MG tablet Take 1 tablet (7.5 mg total) by mouth daily. 02/12/18 02/12/19  Enid Derry, PA-C  metFORMIN (GLUCOPHAGE) 1000 MG tablet Take 1,000 mg by mouth 2 (two) times daily with a meal.     [provider]  metoprolol succinate (TOPROL-XL) 50 MG 24 hr tablet Take 50 mg by mouth daily. Take with or immediately following a meal.    [provider]  montelukast (SINGULAIR) 10 MG tablet Take 1 tablet (10 mg total) by mouth daily. 12/26/17 12/26/18  Shane Crutch, MD  Multiple Vitamin (MULTIVITAMIN) tablet Take 1 tablet by mouth daily.    [provider]  omeprazole (PRILOSEC) 40 MG capsule Take 1 capsule (40 mg total) by mouth daily. 1 hr before supper 04/05/17   Piscoya, Elita Quick, MD  polyethylene glycol (MIRALAX / GLYCOLAX) packet Take 17 g by mouth daily. Mix one tablespoon with 8oz of your favorite juice or water every day until you are having soft formed stools. Then start taking once daily if you  didn't have a stool the day before. 04/17/17   Willy Eddy, MD  pravastatin (PRAVACHOL) 80 MG tablet Take 1 tablet (80 mg total) by mouth daily. 10/17/17 11/16/17  Cleora Fleet, MD  promethazine (PHENERGAN) 25 MG tablet Take 1 tablet (25 mg total) by mouth every 6 (six) hours as needed for nausea or vomiting. 10/10/17   Jene Every, MD  protein supplement shake (PREMIER PROTEIN) LIQD Take 325 mLs (11 oz total) by mouth 3 (three) times daily between meals. 04/05/17   Piscoya, Elita Quick, MD  sucralfate (CARAFATE) 1 g tablet Take 1 g by mouth 2 (two) times daily.     [provider]  SUMAtriptan (IMITREX) 25 MG tablet Take 25 mg by mouth every 2 (two) hours as needed for migraine. May repeat in 2 hours if headache persists or recurs.    [provider]  tiotropium (SPIRIVA HANDIHALER) 18 MCG inhalation capsule Place 1 capsule (18  mcg total) into inhaler and inhale daily. 11/06/17 11/06/18  Shane Crutchamachandran, Pradeep, MD  tiZANidine (ZANAFLEX) 4 MG tablet Take 4 mg by mouth every 6 (six) hours as needed for muscle spasms.    [provider]  traMADol (ULTRAM) 50 MG tablet Take 1 tablet (50 mg total) by mouth every 12 (twelve) hours as needed. 01/02/18   Joni ReiningSmith, Ronald K, PA-C  zolpidem (AMBIEN) 5 MG tablet Take 5 mg by mouth at bedtime.     [provider]    Allergies Penicillins and Ace inhibitors  Family History  Problem Relation Age of Onset  . Hypertension Mother   . Hypertension Father   . Diabetes Father   . Allergies Father   . Breast cancer Neg Hx     Social History Social History   Tobacco Use  . Smoking status: Never Smoker  . Smokeless tobacco: Never Used  Substance Use Topics  . Alcohol use: No  . Drug use: No    Review of Systems  Constitutional: No fever/chills Eyes: No visual changes. ENT: No sore throat. Cardiovascular: Chest tightness Respiratory: Denies shortness of breath. Gastrointestinal: No abdominal pain.  No nausea, no  vomiting.  No diarrhea.  No constipation. Genitourinary: Negative for dysuria. Musculoskeletal: Negative for back pain. Skin: Negative for rash. Neurological: Negative for headaches, focal weakness   ____________________________________________   PHYSICAL EXAM:  VITAL SIGNS: ED Triage Vitals  Enc Vitals Group     BP 03/28/18 1135 (!) 164/93     Pulse Rate 03/28/18 1135 (!) 103     Resp 03/28/18 1135 18     Temp 03/28/18 1135 98.2 F (36.8 C)     Temp Source 03/28/18 1135 Oral     SpO2 03/28/18 1135 98 %     Weight 03/28/18 1137 187 lb (84.8 kg)     Height 03/28/18 1137 5\' 5"  (1.651 m)     Head Circumference --      Peak Flow --      Pain Score 03/28/18 1138 10     Pain Loc --      Pain Edu? --      Excl. in GC? --     Constitutional: Alert and oriented. Well appearing and in no acute distress. Eyes: Conjunctivae are normal.  Head: Atraumatic. Nose: No congestion/rhinnorhea. Mouth/Throat: Mucous membranes are moist.  Oropharynx non-erythematous. Neck: No stridor.  Cardiovascular: Normal rate, regular rhythm. Grossly normal heart sounds.  Good peripheral circulation. Respiratory: Normal respiratory effort.  No retractions. Lungs CTAB. Gastrointestinal: Soft and nontender. No distention. No abdominal bruits. No CVA tenderness. Musculoskeletal: No lower extremity tenderness trace edema.   Neurologic:  Normal speech and language. No gross focal neurologic deficits are appreciated. No gait instability. Skin:  Skin is warm, dry and intact. No rash noted. Psychiatric: Mood and affect are normal. Speech and behavior are normal.  ____________________________________________   LABS (all labs ordered are listed, but only abnormal results are displayed)  Labs Reviewed  BASIC METABOLIC PANEL - Abnormal; Notable for the following components:      Result Value   Glucose, Bld 165 (*)    All other components within normal limits  CBC - Abnormal; Notable for the following  components:   RDW 15.1 (*)    All other components within normal limits  TROPONIN I  BRAIN NATRIURETIC PEPTIDE   ____________________________________________  EKG EKG read interpreted by me shows normal sinus rhythm rate of 96 left axis no acute ST-T wave changes  ____________________________________________  RADIOLOGY  ED MD interpretation: Chest x-ray read by radiology reviewed by me shows no acute disease ultrasound of the leg shows no DVT  Official radiology report(s): Dg Chest 2 View  Result Date: 03/28/2018 CLINICAL DATA:  Chest pain and shortness of breath EXAM: CHEST - 2 VIEW COMPARISON:  01/02/2018 FINDINGS: Cardiac shadow is stable. The lungs are well aerated bilaterally. No focal infiltrate or sizable effusion is seen. No bony abnormality is noted. IMPRESSION: No active cardiopulmonary disease. Electronically Signed   By: Alcide Clever M.D.   On: 03/28/2018 12:49   US Venous Img Lower Bilateral  Result Date: 03/28/2018 CLINICAL DATA:  Bilateral edema EXAM: BILATERAL LOWER EXTREMITY VENOUS DOPPLER ULTRASOUND TECHNIQUE: Gray-scale sonography with graded compression, as well as color Doppler and duplex ultrasound were performed to evaluate the lower extremity deep venous systems from the level of the common femoral vein and including the common femoral, femoral, profunda femoral, popliteal and calf veins including the posterior tibial, peroneal and gastrocnemius veins when visible. The superficial great saphenous vein was also interrogated. Spectral Doppler was utilized to evaluate flow at rest and with distal augmentation maneuvers in the common femoral, femoral and popliteal veins. COMPARISON:  None. FINDINGS: RIGHT LOWER EXTREMITY Common Femoral Vein: No evidence of thrombus. Normal compressibility, respiratory phasicity and response to augmentation. Saphenofemoral Junction: No evidence of thrombus. Normal compressibility and flow on color Doppler imaging. Profunda Femoral Vein: No  evidence of thrombus. Normal compressibility and flow on color Doppler imaging. Femoral Vein: No evidence of thrombus. Normal compressibility, respiratory phasicity and response to augmentation. Popliteal Vein: No evidence of thrombus. Normal compressibility, respiratory phasicity and response to augmentation. Calf Veins: No evidence of thrombus. Normal compressibility and flow on color Doppler imaging. Superficial Great Saphenous Vein: No evidence of thrombus. Normal compressibility. Venous Reflux:  None. Other Findings:  None. LEFT LOWER EXTREMITY Common Femoral Vein: No evidence of thrombus. Normal compressibility, respiratory phasicity and response to augmentation. Saphenofemoral Junction: No evidence of thrombus. Normal compressibility and flow on color Doppler imaging. Profunda Femoral Vein: No evidence of thrombus. Normal compressibility and flow on color Doppler imaging. Femoral Vein: No evidence of thrombus. Normal compressibility, respiratory phasicity and response to augmentation. Popliteal Vein: No evidence of thrombus. Normal compressibility, respiratory phasicity and response to augmentation. Calf Veins: No evidence of thrombus. Normal compressibility and flow on color Doppler imaging. Superficial Great Saphenous Vein: No evidence of thrombus. Normal compressibility. Venous Reflux:  None. Other Findings:  None. IMPRESSION: No evidence of deep venous thrombosis. Electronically Signed   By: Charlett Nose M.D.   On: 03/28/2018 18:30    ____________________________________________   PROCEDURES  Procedure(s) performed:   Procedures  Critical Care performed:  ____________________________________________   INITIAL IMPRESSION / ASSESSMENT AND PLAN / ED COURSE  Patient describes again history that sounds like crescendo angina she had no chest pain previously and then gets chest tightness with exertion getting better with rest is been going on for a week and worsening.  She has been diabetic for  26 years or more.  Even though I have not found anything objectively on the studies her history is very worrisome as I said for crescendo or at least new onset angina we will admit her and and work on this.      ____________________________________________   FINAL CLINICAL IMPRESSION(S) / ED DIAGNOSES  Final diagnoses:  New-onset angina [facility])     ED Discharge Orders    None       Note:  This document was  prepared using Conservation officer, historic buildings and may include unintentional dictation errors.    Arnaldo Natal, MD 03/28/18 [id number]

## 2018-03-28 NOTE — ED Notes (Signed)
2 IV attempts made each by two different RN's.

## 2018-03-29 ENCOUNTER — Other Ambulatory Visit: Payer: Self-pay

## 2018-03-29 ENCOUNTER — Observation Stay
Admit: 2018-03-29 | Discharge: 2018-03-29 | Disposition: A | Discharge status: Home / Self Care | Payer: 59 | Attending: Internal Medicine | Admitting: Internal Medicine

## 2018-03-29 LAB — BASIC METABOLIC PANEL
Anion gap: 9 (ref 5–15)
BUN: 11 mg/dL (ref 6–20)
CO2: 29 mmol/L (ref 22–32)
Calcium: 9 mg/dL (ref 8.9–10.3)
Chloride: 105 mmol/L (ref 98–111)
Creatinine, Ser: 0.65 mg/dL (ref 0.44–1.00)
GFR calc Af Amer: >60 mL/min (ref >60)
GFR calc non Af Amer: >60 mL/min (ref >60)
Glucose, Bld: 144 mg/dL — ABNORMAL HIGH (ref 70–99)
Potassium: 4 mmol/L (ref 3.5–5.1)
Sodium: 143 mmol/L (ref 135–145)

## 2018-03-29 LAB — GLUCOSE, CAPILLARY
Glucose-Capillary: 108 mg/dL — ABNORMAL HIGH (ref 70–99)
Glucose-Capillary: 133 mg/dL — ABNORMAL HIGH (ref 70–99)
Glucose-Capillary: 135 mg/dL — ABNORMAL HIGH (ref 70–99)
Glucose-Capillary: 141 mg/dL — ABNORMAL HIGH (ref 70–99)
Glucose-Capillary: 131 mg/dL — ABNORMAL HIGH (ref 70–99)

## 2018-03-29 LAB — CBC
HCT: 37.5 % (ref 35.0–47.0)
Hemoglobin: 12.7 g/dL (ref 12.0–16.0)
MCH: 30.6 pg (ref 26.0–34.0)
MCHC: 33.9 g/dL (ref 32.0–36.0)
MCV: 90.5 fL (ref 80.0–100.0)
Platelets: 231 10*3/uL (ref 150–440)
RBC: 4.14 MIL/uL (ref 3.80–5.20)
RDW: 14.9 % — ABNORMAL HIGH (ref 11.5–14.5)
WBC: 3.9 10*3/uL (ref 3.6–11.0)

## 2018-03-29 LAB — TROPONIN I
Troponin I: <0.03 ng/mL (ref <0.03)
Troponin I: <0.03 ng/mL (ref <0.03)

## 2018-03-29 NOTE — Consult Note (Signed)
Cardiology Consultation Note    Patient ID: Amanda [name], MRN: [id number], DOB/AGE: [birth year] 53 y.o. Admit date: 03/28/2018   Date of Consult: 03/29/2018 Primary Physician: Margaretann Loveless, MD Primary Cardiologist: Dr. Adrian Blackwater  Chief Complaint: chest pain Reason for Consultation: chest pain Requesting MD: Dr. Allena Katz  HPI: Amanda [name] is a 52 y.o. female with history of diabetes, fibromyalgia, hypertension who presented to the hospital with complaints of chest pain.  This is been going on for some time.  She has had an outpatient work-up per Dr. [name] including a chest CT as well as echocardiograms.  She [facility] an [facility] function EF 65 to 70%.  She had no significant valvular abnormalities but did have a small circumferential pericardial effusion.  There is no evidence of tamponade physiology.  Pain is worse with deep breathing and deep palpation.  Troponins are normal.  Electrolytes are normal.  Lower extremity Doppler revealed no DVT.  Chest x-ray revealed no active cardiopulmonary disease.  She is scheduled for a evaluation of the esophagus as well as thyroid as an outpatient.  Echocardiogram was performed in [facility] hospitalization.  Pain is atypical for angina.  Past Medical History:  Diagnosis Date  . Arthritis    knees, shoulder, upper back  . Asthma   . Diabetes mellitus without complication (HCC)   . Environmental allergies   . Fibromyalgia   . GERD (gastroesophageal reflux disease)   . Headache    migraines - 5x/mo  . Hypertension   . Hypokalemia 10/14/2017  . Migraines   . Motion sickness    ships  . Sleep apnea   . Stroke [facility])    no residual deficits  . Thyroid nodule    bilateral and goiter  . Wears contact lenses   . Wears dentures    partial upper      Surgical History:  Past Surgical History:  Procedure Laterality Date  . ABDOMINAL HYSTERECTOMY  2000's  . ABDOMINAL SURGERY      laparoscopy x4 with lysis of adhesions  . APPENDECTOMY  1986  . CESAREAN SECTION  1992  . CHOLECYSTECTOMY N/A 04/03/2017   Procedure: LAPAROSCOPIC CHOLECYSTECTOMY;  Surgeon: Henrene Dodge, MD;  Location: [facility] ORS;  Service: General;  Laterality: N/A;  . COLONOSCOPY WITH PROPOFOL N/A 03/10/2017   Procedure: COLONOSCOPY WITH PROPOFOL;  Surgeon: Scot Jun, MD;  Location: [facility] ENDOSCOPY;  Service: Endoscopy;  Laterality: N/A;  . ECTOPIC PREGNANCY SURGERY  2000's  . ESOPHAGOGASTRODUODENOSCOPY (EGD) WITH PROPOFOL N/A 03/10/2017   Procedure: ESOPHAGOGASTRODUODENOSCOPY (EGD) WITH PROPOFOL;  Surgeon: Scot Jun, MD;  Location: [facility] ENDOSCOPY;  Service: Endoscopy;  Laterality: N/A;  . HERNIA REPAIR    . JOINT REPLACEMENT Right 12/16/12   knee- medial - makoplasty  . KNEE ARTHROSCOPY Right 2006   partial medial and lateral meniscectomies  . KNEE ARTHROSCOPY Right 10/06/2015   Procedure: RIGHT KNEE ARTHROSCOPY WITH DEBRIDEMENT;  Surgeon: Erin Sons, MD;  Location: [facility];  Service: Orthopedics;  Laterality: Right;  Diabetic - insulin and oral meds  . ROTATOR CUFF REPAIR Left 2006  . TUBAL LIGATION  2000's     Home Meds: Prior to Admission medications   Medication Sig Start Date End Date Taking? Authorizing Provider  albuterol (PROVENTIL HFA) 108 (90 Base) MCG/ACT inhaler Inhale 2 puffs into the lungs every 4 (four) hours as needed for wheezing or shortness of breath. 11/06/17  Yes  Shane Crutchamachandran, Pradeep, MD  Ascorbic Acid (VITAMIN C) 1000 MG tablet Take 1,000 mg by mouth daily.   Yes [provider]  aspirin EC 81 MG tablet Take 1 tablet (81 mg total) by mouth daily. 10/17/17  Yes Johnson, Clanford L, MD  BREO ELLIPTA 200-25 MCG/INH AEPB Inhale 1 puff into the lungs daily. 12/25/17  Yes Shane Crutchamachandran, Pradeep, MD  canagliflozin (INVOKANA) 300 MG TABS tablet Take 300 mg by mouth daily before breakfast.   Yes [provider]  flunisolide (NASALIDE) 25 MCG/ACT  (0.025%) SOLN Place 2 sprays into the nose daily as needed.   Yes [provider]  furosemide (LASIX) 40 MG tablet Take 40 mg by mouth daily.   Yes [provider]  gabapentin (NEURONTIN) 300 MG capsule Take 600 mg by mouth 2 (two) times daily. 03/22/18  Yes [provider]  insulin detemir (LEVEMIR) 100 UNIT/ML injection Inject 12 Units into the skin at bedtime.    Yes [provider]  ipratropium-albuterol (DUONEB) 0.5-2.5 (3) MG/3ML SOLN Inhale contents of 1 vial via nebulizer 3 times daily as needed for shortness of breath/wheeze/cough. 11/21/17  Yes Shane Crutchamachandran, Pradeep, MD  isosorbide mononitrate (IMDUR) 30 MG 24 hr tablet Take 1 tablet (30 mg total) by mouth daily. 10/17/17  Yes Johnson, Clanford L, MD  lidocaine (LIDODERM) 5 % Place 1 patch onto the skin daily. (remove after 12 hours and apply new patch 12 hours later) 02/24/18  Yes [provider]  LINZESS 290 MCG CAPS capsule Take 1 tablet by mouth daily. 02/28/17  Yes [provider]  meloxicam (MOBIC) 7.5 MG tablet Take 1 tablet (7.5 mg total) by mouth daily. Patient taking differently: Take 15 mg by mouth daily.  02/12/18 02/12/19 Yes Enid DerryWagner, Ashley, PA-C  metFORMIN (GLUCOPHAGE) 1000 MG tablet Take 1,000 mg by mouth 2 (two) times daily with a meal.   Yes [provider]  metoprolol succinate (TOPROL-XL) 50 MG 24 hr tablet Take 50 mg by mouth daily. Take with or immediately following a meal.   Yes [provider]  mirtazapine (REMERON SOL-TAB) 30 MG disintegrating tablet Take 30 mg by mouth at bedtime. 03/18/18  Yes [provider]  montelukast (SINGULAIR) 10 MG tablet Take 1 tablet (10 mg total) by mouth daily. 12/26/17 12/26/18 Yes Shane Crutchamachandran, Pradeep, MD  Multiple Vitamin (MULTIVITAMIN) tablet Take 1 tablet by mouth daily.   Yes [provider]  omeprazole (PRILOSEC) 40 MG capsule Take 1 capsule (40 mg total) by mouth daily. 1 hr before supper 04/05/17  Yes  Piscoya, Jose, MD  oxyCODONE (OXY IR/ROXICODONE) 5 MG immediate release tablet Take 5 mg by mouth every 4 (four) hours as needed for pain. 02/23/18  Yes [provider]  Potassium Chloride ER 20 MEQ TBCR Take 20 mEq by mouth daily. 03/11/18  Yes [provider]  pravastatin (PRAVACHOL) 80 MG tablet Take 1 tablet (80 mg total) by mouth daily. Patient taking differently: Take 40 mg by mouth daily.  10/17/17 03/28/18 Yes Johnson, Clanford L, MD  sucralfate (CARAFATE) 1 g tablet Take 1 g by mouth 2 (two) times daily.    Yes [provider]  SUMAtriptan (IMITREX) 25 MG tablet Take 25 mg by mouth every 2 (two) hours as needed for migraine. May repeat in 2 hours if headache persists or recurs.   Yes [provider]  tiotropium (SPIRIVA HANDIHALER) 18 MCG inhalation capsule Place 1 capsule (18 mcg total) into inhaler and inhale daily. 11/06/17 11/06/18 Yes Shane Crutchamachandran, Pradeep, MD  tiZANidine (ZANAFLEX) 4 MG tablet Take 4 mg by mouth every 6 (six) hours as needed for muscle spasms.   Yes [provider]  zolpidem (AMBIEN) 5 MG tablet Take 5 mg by mouth at bedtime.    Yes [provider]  cyclobenzaprine (FLEXERIL) 5 MG tablet Take 1-2 tablets 3 times daily as needed Patient not taking: Reported on 03/28/2018 02/12/18   Enid Derry, PA-C  ketorolac (TORADOL) 10 MG tablet Take 1 tablet (10 mg total) by mouth every 6 (six) hours as needed. Patient not taking: Reported on 03/28/2018 01/02/18   Joni Reining, PA-C  polyethylene glycol [facility] / GLYCOLAX) packet Take 17 g by mouth daily. Mix one tablespoon with 8oz of your favorite juice or water every day until you are having soft formed stools. Then start taking once daily if you didn't have a stool the day before. Patient not taking: Reported on 03/28/2018 04/17/17   Willy Eddy, MD  promethazine (PHENERGAN) 25 MG tablet Take 1 tablet (25 mg total) by mouth every 6 (six) hours as needed for nausea or  vomiting. Patient not taking: Reported on 03/28/2018 10/10/17   Jene Every, MD  protein supplement shake (PREMIER PROTEIN) LIQD Take 325 mLs (11 oz total) by mouth 3 (three) times daily between meals. Patient not taking: Reported on 03/28/2018 04/05/17   Henrene Dodge, MD  traMADol (ULTRAM) 50 MG tablet Take 1 tablet (50 mg total) by mouth every 12 (twelve) hours as needed. Patient not taking: Reported on 03/28/2018 01/02/18   Joni Reining, PA-C    Inpatient Medications:  . aspirin EC  81 mg Oral Daily  . enoxaparin (LOVENOX) injection  40 mg Subcutaneous Q24H  . fluticasone furoate-vilanterol  1 puff Inhalation Daily  . gabapentin  600 mg Oral BID  . insulin aspart  0-5 Units Subcutaneous QHS  . insulin aspart  0-9 Units Subcutaneous TID WC  . insulin detemir  12 Units Subcutaneous QHS  . isosorbide mononitrate  30 mg Oral Daily  . lidocaine  1 patch Transdermal Daily  . linaclotide  290 mcg Oral Daily  . meloxicam  15 mg Oral Daily  . metFORMIN  1,000 mg Oral BID WC  . vitamin C  1,000 mg Oral Daily     Allergies:  Allergies  Allergen Reactions  . Penicillins Anaphylaxis    Has patient had a PCN reaction causing immediate rash, facial/tongue/throat swelling, SOB or lightheadedness with hypotension: Yes Has patient had a PCN reaction causing severe rash involving mucus membranes or skin necrosis: No Has patient had a PCN reaction that required hospitalization No Has patient had a PCN reaction occurring within the last 10 years: No If all of the above answers are "NO", then may proceed with Cephalosporin use.   . Ace Inhibitors Swelling    Social History   Socioeconomic History  . Marital status: Divorced    Spouse name: Not on file  . Number of children: Not on file  . Years of education: Not on file  . Highest education level: Not on file  Occupational History  . Not on file  Social Needs  . Financial resource strain: Not on file  . Food insecurity:    Worry: Not on  file    Inability: Not on file  . Transportation needs:    Medical: Not on file    Non-medical: Not on file  Tobacco Use  . Smoking status: Never Smoker  . Smokeless tobacco: Never Used  Substance and Sexual  Activity  . Alcohol use: No  . Drug use: No  . Sexual activity: Yes    Birth control/protection: Surgical  Lifestyle  . Physical activity:    Days per week: Not on file    Minutes per session: Not on file  . Stress: Not on file  Relationships  . Social connections:    Talks on phone: Not on file    Gets together: Not on file    Attends religious service: Not on file    Active member of club or organization: Not on file    Attends meetings of clubs or organizations: Not on file    Relationship status: Not on file  . Intimate partner violence:    Fear of current or ex partner: Not on file    Emotionally abused: Not on file    Physically abused: Not on file    Forced sexual activity: Not on file  Other Topics Concern  . Not on file  Social History Narrative   Lives at home with son (who is mentally ill)   Ambulates independently.     Family History  Problem Relation Age of Onset  . Hypertension Mother   . Hypertension Father   . Diabetes Father   . Allergies Father   . Breast cancer Neg Hx      Review of Systems: A 12-system review of systems was performed and is negative except as noted in the HPI.  Labs: Recent Labs    03/28/18 1145 03/28/18 2219 03/29/18 0435  TROPONINI <0.03 <0.03 <0.03   Lab Results  Component Value Date   WBC 3.9 03/29/2018   HGB 12.7 03/29/2018   HCT 37.5 03/29/2018   MCV 90.5 03/29/2018   PLT 231 03/29/2018    Recent Labs  Lab 03/29/18 0435  NA 143  K 4.0  CL 105  CO2 29  BUN 11  CREATININE 0.65  CALCIUM 9.0  GLUCOSE 144*   Lab Results  Component Value Date   CHOL 162 10/14/2017   HDL 73 10/14/2017   LDLCALC 72 10/14/2017   TRIG 86 10/14/2017   No results found for: DDIMER  Radiology/Studies:  Dg Chest 2  View  Result Date: 03/28/2018 CLINICAL DATA:  Chest pain and shortness of breath EXAM: CHEST - 2 VIEW COMPARISON:  01/02/2018 FINDINGS: Cardiac shadow is stable. The lungs are well aerated bilaterally. No focal infiltrate or sizable effusion is seen. No bony abnormality is noted. IMPRESSION: No active cardiopulmonary disease. Electronically Signed   By: Alcide Clever M.D.   On: 03/28/2018 12:49   US Venous Img Lower Bilateral  Result Date: 03/28/2018 CLINICAL DATA:  Bilateral edema EXAM: BILATERAL LOWER EXTREMITY VENOUS DOPPLER ULTRASOUND TECHNIQUE: Gray-scale sonography with graded compression, as well as color Doppler and duplex ultrasound were performed to evaluate the lower extremity deep venous systems from the level of the common femoral vein and including the common femoral, femoral, profunda femoral, popliteal and calf veins including the posterior tibial, peroneal and gastrocnemius veins when visible. The superficial great saphenous vein was also interrogated. Spectral Doppler was utilized to evaluate flow at rest and with distal augmentation maneuvers in the common femoral, femoral and popliteal veins. COMPARISON:  None. FINDINGS: RIGHT LOWER EXTREMITY Common Femoral Vein: No evidence of thrombus. Normal compressibility, respiratory phasicity and response to augmentation. Saphenofemoral Junction: No evidence of thrombus. Normal compressibility and flow on color Doppler imaging. Profunda Femoral Vein: No evidence of thrombus. Normal compressibility and flow on color Doppler imaging. Femoral Vein: No  evidence of thrombus. Normal compressibility, respiratory phasicity and response to augmentation. Popliteal Vein: No evidence of thrombus. Normal compressibility, respiratory phasicity and response to augmentation. Calf Veins: No evidence of thrombus. Normal compressibility and flow on color Doppler imaging. Superficial Great Saphenous Vein: No evidence of thrombus. Normal compressibility. Venous Reflux:   None. Other Findings:  None. LEFT LOWER EXTREMITY Common Femoral Vein: No evidence of thrombus. Normal compressibility, respiratory phasicity and response to augmentation. Saphenofemoral Junction: No evidence of thrombus. Normal compressibility and flow on color Doppler imaging. Profunda Femoral Vein: No evidence of thrombus. Normal compressibility and flow on color Doppler imaging. Femoral Vein: No evidence of thrombus. Normal compressibility, respiratory phasicity and response to augmentation. Popliteal Vein: No evidence of thrombus. Normal compressibility, respiratory phasicity and response to augmentation. Calf Veins: No evidence of thrombus. Normal compressibility and flow on color Doppler imaging. Superficial Great Saphenous Vein: No evidence of thrombus. Normal compressibility. Venous Reflux:  None. Other Findings:  None. IMPRESSION: No evidence of deep venous thrombosis. Electronically Signed   By: Charlett Nose M.D.   On: 03/28/2018 18:30    Wt Readings from Last 3 Encounters:  03/28/18 92.6 kg (204 lb 1.6 oz)  02/12/18 86.2 kg (190 lb)  01/02/18 84.8 kg (187 lb)    EKG: Sinus rhythm with nonspecific ST-T wave changes.  Physical Exam:  Blood pressure (!) 135/93, pulse 88, temperature 97.9 F (36.6 C), temperature source Oral, resp. rate 20, height 5\' 5"  (1.651 m), weight 92.6 kg (204 lb 1.6 oz), SpO2 95 %. Body mass index is 33.96 kg/m. General: Well developed, well nourished, in no acute distress. Head: Normocephalic, atraumatic, sclera non-icteric, no xanthomas, nares are without discharge.  Neck: Negative for carotid bruits. JVD not elevated. Lungs: Clear bilaterally to auscultation without wheezes, rales, or rhonchi. Breathing is unlabored. Heart: RRR with S1 S2. No murmurs, rubs, or gallops appreciated. Abdomen: Soft, non-tender, non-distended with normoactive bowel sounds. No hepatomegaly. No rebound/guarding. No obvious abdominal masses. Msk:  Strength and tone appear normal for  age. Extremities: No clubbing or cyanosis. No edema.  Distal pedal pulses are 2+ and equal bilaterally. Neuro: Alert and oriented X 3. No facial asymmetry. No focal deficit. Moves all extremities spontaneously. Psych:  Responds to questions appropriately with a normal affect.     Assessment and Plan  52 year old female with history of fibromyalgia, diabetes, hypertension with a history of a trivial pericardial effusion several months ago who was admitted with atypical chest pain.  She has ruled out for myocardial infarction.  Pain is worsened with deep palpation or deep breathing.  She has no evidence of acute ischemic syndrome.  Will review echo when available and if does not explain her symptoms, consideration for a functional study tomorrow morning could be raised.  Would continue with this current medical regimen for now.  Signed Dalia Heading MD 03/29/2018, 10:33 AM Pager: [phone]

## 2018-03-29 NOTE — Care Management Obs Status (Signed)
MEDICARE OBSERVATION STATUS NOTIFICATION   Patient Details  Name: Amanda [name] MRN: [id number] Date of Birth: 12-Jan-1966   Medicare Observation Status Notification Given:  Yes    Amanda Ulloa A [name], RN 03/29/2018, 9:31 AM

## 2018-03-29 NOTE — Plan of Care (Signed)
  Problem: Education: Goal: Knowledge of General Education information will improve Description: Including pain rating scale, medication(s)/side effects and non-pharmacologic comfort measures Outcome: Progressing   Problem: Health Behavior/Discharge Planning: Goal: Ability to manage health-related needs will improve Outcome: Progressing   Problem: Pain Managment: Goal: General experience of comfort will improve Outcome: Progressing   

## 2018-03-29 NOTE — Progress Notes (Signed)
Attempted to wean patient oxygen this morning. Patient sats on RA were 94%. Patient was tachypneic on RA. She reported that she felt short or breath and wanted to keep the oxygen. Patient placed on 1L oxygen. Around 1700 patient reported feeling tightness in her chest. Respiratory called for prn breathing treatment to see if that would provide some relief.

## 2018-03-29 NOTE — Progress Notes (Signed)
[employer] - [place] at [facility]                                                                                                                                                                                  Patient Demographics   Amanda [name], is a 52 y.o. female, DOB - [birth year], RUE:[id number]  Admit date - 03/28/2018   Admitting Physician Enid Baas, MD  Outpatient Primary MD for the patient is Margaretann Loveless, MD   LOS - 0  Subjective: Patient admitted with chest pain continues to have chest pain Also complains of some dyspnea on exertion   Review of Systems:   CONSTITUTIONAL: No documented fever. No fatigue, weakness. No weight gain, no weight loss.  EYES: No blurry or double vision.  ENT: No tinnitus. No postnasal drip. No redness of the oropharynx.  RESPIRATORY: No cough, no wheeze, no hemoptysis. No dyspnea.  CARDIOVASCULAR: Positive chest pain. No orthopnea. No palpitations. No syncope.  GASTROINTESTINAL: No nausea, no vomiting or diarrhea. No abdominal pain. No melena or hematochezia.  GENITOURINARY: No dysuria or hematuria.  ENDOCRINE: No polyuria or nocturia. No heat or cold intolerance.  HEMATOLOGY: No anemia. No bruising. No bleeding.  INTEGUMENTARY: No rashes. No lesions.  MUSCULOSKELETAL: No arthritis. No swelling. No gout.  NEUROLOGIC: No numbness, tingling, or ataxia. No seizure-type activity.  PSYCHIATRIC: No anxiety. No insomnia. No ADD.    Vitals:   Vitals:   03/29/18 0027 03/29/18 0820 03/29/18 1016 03/29/18 1018  BP: (!) 139/98 (!) 135/93    Pulse: 74 78 (!) 103 88  Resp:  18 20   Temp:  97.9 F (36.6 C)    TempSrc:  Oral    SpO2:  100% 94% 95%  Weight:      Height:        Wt Readings from Last 3 Encounters:  03/28/18 92.6 kg (204 lb 1.6 oz)  02/12/18 86.2 kg (190 lb)  01/02/18 84.8 kg (187 lb)     Intake/Output Summary (Last 24 hours) at 03/29/2018 1416 Last data filed at 03/29/2018 1034 Gross per 24 hour   Intake 237 ml  Output 1 ml  Net 236 ml    Physical Exam:   GENERAL: Pleasant-appearing in no apparent distress.  HEAD, EYES, EARS, NOSE AND THROAT: Atraumatic, normocephalic. Extraocular muscles are intact. Pupils equal and reactive to light. Sclerae anicteric. No conjunctival injection. No oro-pharyngeal erythema.  NECK: Supple. There is no jugular venous distention. No bruits, no lymphadenopathy, no thyromegaly.  HEART: Regular rate and rhythm,. No murmurs, no rubs, no clicks.  LUNGS: Clear to auscultation bilaterally. No rales or rhonchi. No wheezes.  ABDOMEN: Soft, flat, nontender, nondistended.  Has good bowel sounds. No hepatosplenomegaly appreciated.  EXTREMITIES: No evidence of any cyanosis, clubbing, or peripheral edema.  +2 pedal and radial pulses bilaterally.  NEUROLOGIC: The patient is alert, awake, and oriented x3 with no focal motor or sensory deficits appreciated bilaterally.  SKIN: Moist and warm with no rashes appreciated.  Psych: Not anxious, depressed LN: No inguinal LN enlargement    Antibiotics   Anti-infectives (From admission, onward)   None      Medications   Scheduled Meds: . aspirin EC  81 mg Oral Daily  . enoxaparin (LOVENOX) injection  40 mg Subcutaneous Q24H  . fluticasone furoate-vilanterol  1 puff Inhalation Daily  . gabapentin  600 mg Oral BID  . insulin aspart  0-5 Units Subcutaneous QHS  . insulin aspart  0-9 Units Subcutaneous TID WC  . insulin detemir  12 Units Subcutaneous QHS  . isosorbide mononitrate  30 mg Oral Daily  . lidocaine  1 patch Transdermal Daily  . linaclotide  290 mcg Oral Daily  . meloxicam  15 mg Oral Daily  . metFORMIN  1,000 mg Oral BID WC  . vitamin C  1,000 mg Oral Daily   Continuous Infusions: PRN Meds:.acetaminophen **OR** acetaminophen, albuterol, morphine injection, nitroGLYCERIN, ondansetron **OR** ondansetron (ZOFRAN) IV, oxyCODONE   Data Review:   Micro Results No results found for this or any  previous visit (from the past 240 hour(s)).  Radiology Reports Dg Chest 2 View  Result Date: 03/28/2018 CLINICAL DATA:  Chest pain and shortness of breath EXAM: CHEST - 2 VIEW COMPARISON:  01/02/2018 FINDINGS: Cardiac shadow is stable. The lungs are well aerated bilaterally. No focal infiltrate or sizable effusion is seen. No bony abnormality is noted. IMPRESSION: No active cardiopulmonary disease. Electronically Signed   By: Alcide CleverMark  Lukens M.D.   On: 03/28/2018 12:49   [place] Venous Img Lower Bilateral  Result Date: 03/28/2018 CLINICAL DATA:  Bilateral edema EXAM: BILATERAL LOWER EXTREMITY VENOUS DOPPLER ULTRASOUND TECHNIQUE: Gray-scale sonography with graded compression, as well as color Doppler and duplex ultrasound were performed to evaluate the lower extremity deep venous systems from the level of the common femoral vein and including the common femoral, femoral, profunda femoral, popliteal and calf veins including the posterior tibial, peroneal and gastrocnemius veins when visible. The superficial great saphenous vein was also interrogated. Spectral Doppler was utilized to evaluate flow at rest and with distal augmentation maneuvers in the common femoral, femoral and popliteal veins. COMPARISON:  None. FINDINGS: RIGHT LOWER EXTREMITY Common Femoral Vein: No evidence of thrombus. Normal compressibility, respiratory phasicity and response to augmentation. Saphenofemoral Junction: No evidence of thrombus. Normal compressibility and flow on color Doppler imaging. Profunda Femoral Vein: No evidence of thrombus. Normal compressibility and flow on color Doppler imaging. Femoral Vein: No evidence of thrombus. Normal compressibility, respiratory phasicity and response to augmentation. Popliteal Vein: No evidence of thrombus. Normal compressibility, respiratory phasicity and response to augmentation. Calf Veins: No evidence of thrombus. Normal compressibility and flow on color Doppler imaging. Superficial Great  Saphenous Vein: No evidence of thrombus. Normal compressibility. Venous Reflux:  None. Other Findings:  None. LEFT LOWER EXTREMITY Common Femoral Vein: No evidence of thrombus. Normal compressibility, respiratory phasicity and response to augmentation. Saphenofemoral Junction: No evidence of thrombus. Normal compressibility and flow on color Doppler imaging. Profunda Femoral Vein: No evidence of thrombus. Normal compressibility and flow on color Doppler imaging. Femoral Vein: No evidence of thrombus. Normal compressibility, respiratory phasicity and response to augmentation. Popliteal Vein: No evidence of thrombus. Normal compressibility,  respiratory phasicity and response to augmentation. Calf Veins: No evidence of thrombus. Normal compressibility and flow on color Doppler imaging. Superficial Great Saphenous Vein: No evidence of thrombus. Normal compressibility. Venous Reflux:  None. Other Findings:  None. IMPRESSION: No evidence of deep venous thrombosis. Electronically Signed   By: Charlett Nose M.D.   On: 03/28/2018 18:30     CBC Recent Labs  Lab 03/28/18 1145 03/29/18 0435  WBC 4.7 3.9  HGB 12.8 12.7  HCT 38.0 37.5  PLT 231 231  MCV 90.7 90.5  MCH 30.6 30.6  MCHC 33.7 33.9  RDW 15.1* 14.9*    Chemistries  Recent Labs  Lab 03/28/18 1145 03/29/18 0435  NA 142 143  K 3.8 4.0  CL 101 105  CO2 31 29  GLUCOSE 165* 144*  BUN 8 11  CREATININE 0.80 0.65  CALCIUM 9.6 9.0   ------------------------------------------------------------------------------------------------------------------ estimated creatinine clearance is 91.4 mL/min (by C-G formula based on SCr of 0.65 mg/dL). ------------------------------------------------------------------------------------------------------------------ No results for input(s): HGBA1C in the last 72 hours. ------------------------------------------------------------------------------------------------------------------ No results for input(s): CHOL,  HDL, LDLCALC, TRIG, CHOLHDL, LDLDIRECT in the last 72 hours. ------------------------------------------------------------------------------------------------------------------ No results for input(s): TSH, T4TOTAL, T3FREE, THYROIDAB in the last 72 hours.  Invalid input(s): FREET3 ------------------------------------------------------------------------------------------------------------------ No results for input(s): VITAMINB12, FOLATE, FERRITIN, TIBC, IRON, RETICCTPCT in the last 72 hours.  Coagulation profile No results for input(s): INR, PROTIME in the last 168 hours.  No results for input(s): DDIMER in the last 72 hours.  Cardiac Enzymes Recent Labs  Lab 03/28/18 2219 03/29/18 0435 03/29/18 1046  TROPONINI <0.03 <0.03 <0.03   ------------------------------------------------------------------------------------------------------------------ Invalid input(s): POCBNP    Assessment & Plan    Amanda [name]  is a 52 y.o. female with a known history of arthritis, fibromyalgia, non-insulin-dependent diabetes mellitus, hypertension and history of minor stroke without any residual neurological deficits presents to hospital secondary to chest pain.  1. Chest pain-atypical cardiac enzymes negative Appreciate cardiology input   2. Diabetes mellitus-type II diabetes. Continue Levemir, sliding scale insulin. Also on invokana and metformin  3. Pedal edema-unlikely due to CHF recent echo was normal he did have some diastolic dysfunction however patient has not had  any significant output with IV Lasix, chest x-ray was negative   4. Fibromyalgia and also degenerative disc disease in the neck-follows with physiatrist -continue outpatient medications  5. DVT prophylaxis-on Lovenox       Code Status Orders  (From admission, onward)        Start     Ordered   03/28/18 2135  Full code  Continuous     03/28/18 2134    Code Status History    Date Active Date Inactive Code  Status Order ID Comments User Context   10/14/2017 1002 10/17/2017 1341 Full Code [id number]  Eddie North, MD Inpatient   06/03/2017 1631 06/06/2017 1556 Full Code [id number]  Adrian Saran, MD Inpatient   04/02/2017 1505 04/05/2017 1912 Full Code [id number]  Henrene Dodge, MD Inpatient   03/18/2016 1553 03/20/2016 1748 Full Code [id number]  Katha Hamming, MD ED           Consults cards  DVT Prophylaxis  Lovenox    Lab Results  Component Value Date   PLT 231 03/29/2018     Time Spent in minutes    Greater than 50% of time spent in care coordination and counseling patient regarding the condition and plan of care.   Auburn Bilberry M.D on 03/29/2018 at 2:16 PM  Between 7am to 6pm -  Pager - [phone]  After 6pm go to [email or web address] - Proofreader  [employer]   Office  [phone]

## 2018-03-30 ENCOUNTER — Observation Stay: Payer: 59

## 2018-03-30 LAB — GLUCOSE, CAPILLARY: Glucose-Capillary: 155 mg/dL — ABNORMAL HIGH (ref 70–99)

## 2018-03-30 LAB — ECHOCARDIOGRAM COMPLETE
Height: 65 in
Weight: 3265.6 oz

## 2018-03-30 MED ORDER — IOPAMIDOL (ISOVUE-370) INJECTION 76%
75.0000 mL | Freq: Once | INTRAVENOUS | Status: AC | PRN
  Administered 2018-03-30: 75 mL via INTRAVENOUS

## 2018-03-30 NOTE — Progress Notes (Signed)
SUBJECTIVE: no further chest pain   Vitals:   03/29/18 1822 03/29/18 1945 03/30/18 0427 03/30/18 0816  BP:  138/90 127/84 140/82  Pulse:  (!) 118 92 85  Resp:  18 18 18   Temp:  98.5 F (36.9 C) 98.5 F (36.9 C) 98.4 F (36.9 C)  TempSrc:  Oral Oral Oral  SpO2: 99% 98% 98% 94%  Weight:      Height:        Intake/Output Summary (Last 24 hours) at 03/30/2018 0902 Last data filed at 03/29/2018 1828 Gross per 24 hour  Intake 0 ml  Output 401 ml  Net -401 ml    LABS: Basic Metabolic Panel: Recent Labs    03/28/18 1145 03/29/18 0435  NA 142 143  K 3.8 4.0  CL 101 105  CO2 31 29  GLUCOSE 165* 144*  BUN 8 11  CREATININE 0.80 0.65  CALCIUM 9.6 9.0   Liver Function Tests: No results for input(s): AST, ALT, ALKPHOS, BILITOT, PROT, ALBUMIN in the last 72 hours. No results for input(s): LIPASE, AMYLASE in the last 72 hours. CBC: Recent Labs    03/28/18 1145 03/29/18 0435  WBC 4.7 3.9  HGB 12.8 12.7  HCT 38.0 37.5  MCV 90.7 90.5  PLT 231 231   Cardiac Enzymes: Recent Labs    03/28/18 2219 03/29/18 0435 03/29/18 1046  TROPONINI <0.03 <0.03 <0.03   BNP: Invalid input(s): POCBNP D-Dimer: No results for input(s): DDIMER in the last 72 hours. Hemoglobin A1C: No results for input(s): HGBA1C in the last 72 hours. Fasting Lipid Panel: No results for input(s): CHOL, HDL, LDLCALC, TRIG, CHOLHDL, LDLDIRECT in the last 72 hours. Thyroid Function Tests: No results for input(s): TSH, T4TOTAL, T3FREE, THYROIDAB in the last 72 hours.  Invalid input(s): FREET3 Anemia Panel: No results for input(s): VITAMINB12, FOLATE, FERRITIN, TIBC, IRON, RETICCTPCT in the last 72 hours.   PHYSICAL EXAM General: Well developed, well nourished, in no acute distress HEENT:  Normocephalic and atramatic Neck:  No JVD.  Lungs: Clear bilaterally to auscultation and percussion. Heart: HRRR . Normal S1 and S2 without gallops or murmurs.  Abdomen: Bowel sounds are positive, abdomen soft and  non-tender  Msk:  Back normal, normal gait. Normal strength and tone for age. Extremities: No clubbing, cyanosis or edema.   Neuro: Alert and oriented X 3. Psych:  Good affect, responds appropriately  TELEMETRY:NSR  ASSESSMENT AND PLAN: Atypical chest pain. R/o for MI. May go home with f/u Thursday 10 am.  Active Problems:   Chest pain    Roshanna Cimino A, MD, [facility] CenterFACC 03/30/2018 9:02 AM

## 2018-03-30 NOTE — Discharge Summary (Signed)
[employer] - [place] at [facility], [place] y.o., DOB August 02, 1966, MRN [id number]. Admission date: 03/28/2018 Discharge Date 03/30/2018 Primary MD Margaretann Loveless, MD Admitting Physician Enid Baas, MD  Admission Diagnosis  New-onset angina [facility]) [I20.9]  Discharge Diagnosis   Active Problems:   Chest pain noncardiac Shortness of breath CHF ruled out PE ruled out possibly anxiety driven Diabetes type 2 Pedal edema unlikely due to CHF Fibromyalgia     Hospital Course Amanda [name] a52 y.o.femalewith a known history of arthritis, fibromyalgia, non-insulin-dependent diabetes mellitus, hypertension and history of minor stroke without any residual neurological deficits presents to hospital secondary to chest pain.  Patient has had a history of atypical chest pain in the past.  Who presented with same.  She also complained of shortness of breath.  There was some concern of CHF however chest x-ray was negative.  Patient's EKG cardiac enzymes were negative.  She was seen by cardiology had a echocardiogram which showed some diastolic dysfunction but no other abnormality.  So had a CT per PE which showed no evidence of PE.  Patient recommended to follow-up with outpatient cardiology.  Pulmonary if needed.                Consults  cardiology  Significant Tests:  See full reports for all details     Dg Chest 2 View  Result Date: 03/28/2018 CLINICAL DATA:  Chest pain and shortness of breath EXAM: CHEST - 2 VIEW COMPARISON:  01/02/2018 FINDINGS: Cardiac shadow is stable. The lungs are well aerated bilaterally. No focal infiltrate or sizable effusion is seen. No bony abnormality is noted. IMPRESSION: No active cardiopulmonary disease. Electronically Signed   By: Alcide Clever M.D.   On: 03/28/2018 12:49   Ct Angio Chest Pe W Or Wo Contrast  Result Date: 03/30/2018 CLINICAL DATA:  Shortness of breath EXAM: CT ANGIOGRAPHY CHEST WITH CONTRAST  TECHNIQUE: Multidetector CT imaging of the chest was performed using the standard protocol during bolus administration of intravenous contrast. Multiplanar CT image reconstructions and MIPs were obtained to evaluate the vascular anatomy. CONTRAST:  75mL ISOVUE-370 IOPAMIDOL (ISOVUE-370) INJECTION 76% COMPARISON:  06/10/2017 FINDINGS: Cardiovascular: Heart is enlarged. Small pericardial effusion similar to the mildly increased in the interval. No thoracic aortic aneurysm. No filling defect within the opacified pulmonary arteries to suggest the presence of an acute pulmonary embolus. Mediastinum/Nodes: No mediastinal lymphadenopathy. There is no hilar lymphadenopathy. The esophagus has normal imaging features. There is no axillary lymphadenopathy. Lungs/Pleura: The central tracheobronchial airways are patent. Low volumes bilaterally. Subtle areas of mosaic ground-glass attenuation may reflect air trapping. Atelectasis noted in the lung bases. No substantial pulmonary edema or pleural effusion. No findings to suggest focal pneumonia. Upper Abdomen: The liver shows diffusely decreased attenuation suggesting steatosis. Multiple low-density lesions in the left liver are similar to prior and likely represent cysts. Musculoskeletal: No worrisome lytic or sclerotic osseous abnormality. Review of the MIP images confirms the above findings. IMPRESSION: 1. No CT evidence for acute pulmonary embolus. 2. Cardiomegaly with small pericardial effusion stable to slightly increased in the interval. Electronically Signed   By: Kennith Center M.D.   On: 03/30/2018 10:24   US Venous Img Lower Bilateral  Result Date: 03/28/2018 CLINICAL DATA:  Bilateral edema EXAM: BILATERAL LOWER EXTREMITY VENOUS DOPPLER ULTRASOUND TECHNIQUE: Gray-scale sonography with graded compression, as well as color Doppler and duplex ultrasound were performed to evaluate the lower extremity deep venous systems from the level of the common femoral vein  and  including the common femoral, femoral, profunda femoral, popliteal and calf veins including the posterior tibial, peroneal and gastrocnemius veins when visible. The superficial great saphenous vein was also interrogated. Spectral Doppler was utilized to evaluate flow at rest and with distal augmentation maneuvers in the common femoral, femoral and popliteal veins. COMPARISON:  None. FINDINGS: RIGHT LOWER EXTREMITY Common Femoral Vein: No evidence of thrombus. Normal compressibility, respiratory phasicity and response to augmentation. Saphenofemoral Junction: No evidence of thrombus. Normal compressibility and flow on color Doppler imaging. Profunda Femoral Vein: No evidence of thrombus. Normal compressibility and flow on color Doppler imaging. Femoral Vein: No evidence of thrombus. Normal compressibility, respiratory phasicity and response to augmentation. Popliteal Vein: No evidence of thrombus. Normal compressibility, respiratory phasicity and response to augmentation. Calf Veins: No evidence of thrombus. Normal compressibility and flow on color Doppler imaging. Superficial Great Saphenous Vein: No evidence of thrombus. Normal compressibility. Venous Reflux:  None. Other Findings:  None. LEFT LOWER EXTREMITY Common Femoral Vein: No evidence of thrombus. Normal compressibility, respiratory phasicity and response to augmentation. Saphenofemoral Junction: No evidence of thrombus. Normal compressibility and flow on color Doppler imaging. Profunda Femoral Vein: No evidence of thrombus. Normal compressibility and flow on color Doppler imaging. Femoral Vein: No evidence of thrombus. Normal compressibility, respiratory phasicity and response to augmentation. Popliteal Vein: No evidence of thrombus. Normal compressibility, respiratory phasicity and response to augmentation. Calf Veins: No evidence of thrombus. Normal compressibility and flow on color Doppler imaging. Superficial Great Saphenous Vein: No evidence of  thrombus. Normal compressibility. Venous Reflux:  None. Other Findings:  None. IMPRESSION: No evidence of deep venous thrombosis. Electronically Signed   By: Charlett Nose M.D.   On: 03/28/2018 18:30       Today   Subjective:   Amanda [name] has some shortness of breath but no chest pain Objective:   Blood pressure 140/82, pulse 85, temperature 98.4 F (36.9 C), temperature source Oral, resp. rate 18, height 5\' 5"  (1.651 m), weight 92.6 kg (204 lb 1.6 oz), SpO2 94 %.  .  Intake/Output Summary (Last 24 hours) at 03/30/2018 1312 Last data filed at 03/30/2018 1058 Gross per 24 hour  Intake 240 ml  Output 400 ml  Net -160 ml    Exam VITAL SIGNS: Blood pressure 140/82, pulse 85, temperature 98.4 F (36.9 C), temperature source Oral, resp. rate 18, height 5\' 5"  (1.651 m), weight 92.6 kg (204 lb 1.6 oz), SpO2 94 %.  GENERAL:  52 y.o.-year-old patient lying in the bed with no acute distress.  EYES: Pupils equal, round, reactive to light and accommodation. No scleral icterus. Extraocular muscles intact.  HEENT: Head atraumatic, normocephalic. Oropharynx and nasopharynx clear.  NECK:  Supple, no jugular venous distention. No thyroid enlargement, no tenderness.  LUNGS: Normal breath sounds bilaterally, no wheezing, rales,rhonchi or crepitation. No use of accessory muscles of respiration.  CARDIOVASCULAR: S1, S2 normal. No murmurs, rubs, or gallops.  ABDOMEN: Soft, nontender, nondistended. Bowel sounds present. No organomegaly or mass.  EXTREMITIES: No pedal edema, cyanosis, or clubbing.  NEUROLOGIC: Cranial nerves II through XII are intact. Muscle strength 5/5 in all extremities. Sensation intact. Gait not checked.  PSYCHIATRIC: The patient is alert and oriented x 3.  SKIN: No obvious rash, lesion, or ulcer.   Data Review     CBC w Diff:  Lab Results  Component Value Date   WBC 3.9 03/29/2018   HGB 12.7 03/29/2018   HGB 11.4 (L) 11/12/2011   HCT 37.5 03/29/2018   HCT  33.5 (L)  11/12/2011   PLT 231 03/29/2018   PLT 262 11/12/2011   LYMPHOPCT 39 10/16/2017   MONOPCT 6 10/16/2017   EOSPCT 1 10/16/2017   BASOPCT 0 10/16/2017   CMP:  Lab Results  Component Value Date   NA 143 03/29/2018   NA 137 11/12/2011   K 4.0 03/29/2018   K 4.9 11/12/2011   CL 105 03/29/2018   CL 101 11/12/2011   CO2 29 03/29/2018   CO2 26 11/12/2011   BUN 11 03/29/2018   BUN 14 11/12/2011   CREATININE 0.65 03/29/2018   CREATININE 0.71 11/12/2011   PROT 6.9 10/16/2017   PROT 6.9 11/12/2011   ALBUMIN 3.7 10/16/2017   ALBUMIN 3.2 (L) 11/12/2011   BILITOT 0.3 10/16/2017   BILITOT 0.2 11/12/2011   ALKPHOS 103 10/16/2017   ALKPHOS 62 11/12/2011   AST 23 10/16/2017   AST 31 11/12/2011   ALT 37 10/16/2017   ALT 28 11/12/2011  .  Micro Results No results found for this or any previous visit (from the past 240 hour(s)).      Code Status Orders  (From admission, onward)        Start     Ordered   03/28/18 2135  Full code  Continuous     03/28/18 2134    Code Status History    Date Active Date Inactive Code Status Order ID Comments User Context   10/14/2017 1002 10/17/2017 1341 Full Code [id number]  Eddie North, MD Inpatient   06/03/2017 1631 06/06/2017 1556 Full Code [id number]  Adrian Saran, MD Inpatient   04/02/2017 1505 04/05/2017 1912 Full Code [id number]  Henrene Dodge, MD Inpatient   03/18/2016 1553 03/20/2016 1748 Full Code [id number]  Katha Hamming, MD ED          Follow-up Information    Margaretann Loveless, MD In 6 days.   Specialty:  Internal Medicine Why:  PLEASE CALL THE OFFICE TO SCHEDULE THIS APPOINTMENT PLEASE Contact information: [address] [place] [address] [phone]        Laurier Nancy, MD. Go on 04/02/2018.   Specialty:  Cardiology Why:  Appointment Time: 1:15pm Contact information: [address] [place] [address] [phone]           Discharge Medications   Allergies as of 03/30/2018      Reactions    Penicillins Anaphylaxis   Has patient had a PCN reaction causing immediate rash, facial/tongue/throat swelling, SOB or lightheadedness with hypotension: Yes Has patient had a PCN reaction causing severe rash involving mucus membranes or skin necrosis: No Has patient had a PCN reaction that required hospitalization No Has patient had a PCN reaction occurring within the last 10 years: No If all of the above answers are "NO", then may proceed with Cephalosporin use.   Ace Inhibitors Swelling      Medication List    STOP taking these medications   traMADol 50 MG tablet Commonly known as:  ULTRAM     TAKE these medications   albuterol 108 (90 Base) MCG/ACT inhaler Commonly known as:  PROVENTIL HFA Inhale 2 puffs into the lungs every 4 (four) hours as needed for wheezing or shortness of breath.   aspirin EC 81 MG tablet Take 1 tablet (81 mg total) by mouth daily.   BREO ELLIPTA 200-25 MCG/INH Aepb Generic drug:  fluticasone furoate-vilanterol Inhale 1 puff into the lungs daily.   cyclobenzaprine 5 MG tablet Commonly known as:  FLEXERIL Take 1-2 tablets 3  times daily as needed   flunisolide 25 MCG/ACT (0.025%) Soln Commonly known as:  NASALIDE Place 2 sprays into the nose daily as needed.   furosemide 40 MG tablet Commonly known as:  LASIX Take 40 mg by mouth daily.   gabapentin 300 MG capsule Commonly known as:  NEURONTIN Take 600 mg by mouth 2 (two) times daily.   insulin detemir 100 UNIT/ML injection Commonly known as:  LEVEMIR Inject 12 Units into the skin at bedtime.   INVOKANA 300 MG Tabs tablet Generic drug:  canagliflozin Take 300 mg by mouth daily before breakfast.   ipratropium-albuterol 0.5-2.5 (3) MG/3ML Soln Commonly known as:  DUONEB Inhale contents of 1 vial via nebulizer 3 times daily as needed for shortness of breath/wheeze/cough.   isosorbide mononitrate 30 MG 24 hr tablet Commonly known as:  IMDUR Take 1 tablet (30 mg total) by mouth daily.    ketorolac 10 MG tablet Commonly known as:  TORADOL Take 1 tablet (10 mg total) by mouth every 6 (six) hours as needed.   lidocaine 5 % Commonly known as:  LIDODERM Place 1 patch onto the skin daily. (remove after 12 hours and apply new patch 12 hours later)   LINZESS 290 MCG Caps capsule Generic drug:  linaclotide Take 1 tablet by mouth daily.   meloxicam 7.5 MG tablet Commonly known as:  MOBIC Take 1 tablet (7.5 mg total) by mouth daily. What changed:  how much to take   metFORMIN 1000 MG tablet Commonly known as:  GLUCOPHAGE Take 1,000 mg by mouth 2 (two) times daily with a meal.   metoprolol succinate 50 MG 24 hr tablet Commonly known as:  TOPROL-XL Take 50 mg by mouth daily. Take with or immediately following a meal.   mirtazapine 30 MG disintegrating tablet Commonly known as:  REMERON SOL-TAB Take 30 mg by mouth at bedtime.   montelukast 10 MG tablet Commonly known as:  SINGULAIR Take 1 tablet (10 mg total) by mouth daily.   multivitamin tablet Take 1 tablet by mouth daily.   omeprazole 40 MG capsule Commonly known as:  PRILOSEC Take 1 capsule (40 mg total) by mouth daily. 1 hr before supper   oxyCODONE 5 MG immediate release tablet Commonly known as:  Oxy IR/ROXICODONE Take 5 mg by mouth every 4 (four) hours as needed for pain.   polyethylene glycol packet Commonly known as:  MIRALAX / GLYCOLAX Take 17 g by mouth daily. Mix one tablespoon with 8oz of your favorite juice or water every day until you are having soft formed stools. Then start taking once daily if you didn't have a stool the day before.   Potassium Chloride ER 20 MEQ Tbcr Take 20 mEq by mouth daily.   pravastatin 80 MG tablet Commonly known as:  PRAVACHOL Take 1 tablet (80 mg total) by mouth daily. What changed:  how much to take   promethazine 25 MG tablet Commonly known as:  PHENERGAN Take 1 tablet (25 mg total) by mouth every 6 (six) hours as needed for nausea or vomiting.   protein  supplement shake Liqd Commonly known as:  PREMIER PROTEIN Take 325 mLs (11 oz total) by mouth 3 (three) times daily between meals.   sucralfate 1 g tablet Commonly known as:  CARAFATE Take 1 g by mouth 2 (two) times daily.   SUMAtriptan 25 MG tablet Commonly known as:  IMITREX Take 25 mg by mouth every 2 (two) hours as needed for migraine. May repeat in 2 hours if  headache persists or recurs.   tiotropium 18 MCG inhalation capsule Commonly known as:  SPIRIVA HANDIHALER Place 1 capsule (18 mcg total) into inhaler and inhale daily.   tiZANidine 4 MG tablet Commonly known as:  ZANAFLEX Take 4 mg by mouth every 6 (six) hours as needed for muscle spasms.   vitamin C 1000 MG tablet Take 1,000 mg by mouth daily.   zolpidem 5 MG tablet Commonly known as:  AMBIEN Take 5 mg by mouth at bedtime.          Total Time in preparing paper work, data evaluation and todays exam - 35 minutes  Auburn BilberryShreyang Moxie Kalil M.D on 03/30/2018 at 1:12 PM [facility]   Office  [phone]

## 2018-03-30 NOTE — Discharge Instructions (Signed)

## 2018-03-30 NOTE — Progress Notes (Signed)
Will transfer cardiovascular consultative care to Dr. Adrian BlackwaterShaukat Khan, patient's primary cardiologist.  Please contact me if there are any questions.

## 2018-04-13 NOTE — Progress Notes (Signed)
[facility]      Assessment and Plan:  52 yo female with persistent asthma and frequent exacerbations.   Asthma  --Severe persistent asthma with daily symptoms. --continue Breo, Singulair.  Again Restart Spiriva.   -Patient apparently completed Rast testing at her primary care physician's office, will obtain results. -Patient has already received flu vaccine and pneumonia vaccine.  Dyspnea.  --Multifactorial from asthma obesity, recommended that she continue exercising   OSA --She stopped using cpap due to lack of supplies.  --She is asked to call [facility] to restart cpap.   GERD. -Continue omeprazole daily.   No follow-ups on file.   Date: 04/13/2018  MRN# [id number] Barnabas HarriesLeomia W Walker 1965-12-19  Referring Physician: Pasty Spillersracy N McLean-Scocozza, MD   Roxy HorsemanLeomia W Dan HumphreysWalker is a 52 y.o. old female seen in consultation for chief complaint of:    No chief complaint on file.   HPI:   The patient is a 52 year old female with a history of asthma, hypertension, diabetes, GERD, fibromyalgia. She has been to [facility] because of a TIA since her last visit.  At last visit asked to continue with singulair, nebs, Breo, restarted on Spiriva.  Sent for Rast testing which was not completed.  She notes that she continues to get out of breath with mild activity. She is using Breo daily, rinse mouth after use. She uses rescue inhaler about twice per day. She also uses nebs twice daily. She never started the spiriva apparently it went to a different pharmacy so she could not get it.  She does not take an antihistamine.  Reflux is controlled with omeprazole.  She has OSA, but stopped due to not having supplies.   AE Asthma exacerbations July of 2017 and ER visit due to asthma in October 2018.  **CBC with differential 04/02/17>> Eosinophil equals 0 **Oxygen desaturation walk 06/16/2017>>beginning O2 sat on RA at rest. After walking 600 feet her sat was normal and HR 115.  Moderate dyspnea but recovered quickly.   CT chest images from 03/30/18>> and compared with previous from 05/21/16>> images personally reviewed; mild interstitial edema, reduced lung volumes, bibasilar atelectasis.  Chest x-ray 03/20/16 and CT chest images from 01/04/16: Bibasilar subpleural fine interstitial changes, some mild bibasilar bronchiectasis changes. Cardiomegaly> Finding c/w CHF vs early pulmonary fibrosis.   Summary of outside records: Patient has a history of diabetes mellitus, hypertension, sleep apnea, asthma, multiple episodes of pneumonia, review of CMP from 03/26/16, normal. She has been having trouble with wheezing and has been treated for asthma exacerbation and pneumonia. [facility] and was told it was consistent with heart failure.  CBC 04/11/16: Eos 0.1 RAST: Pending.  PFT 05/23/16: Spirometry shows restriction without reversal with bronchodilatory therapy.   Medication:   Reviewed.     Allergies:  Penicillins and Ace inhibitors      LABORATORY PANEL:   CBC No results for input(s): WBC, HGB, HCT, PLT in the last 168 hours. ------------------------------------------------------------------------------------------------------------------  Chemistries  No results for input(s): NA, K, CL, CO2, GLUCOSE, BUN, CREATININE, CALCIUM, MG, AST, ALT, ALKPHOS, BILITOT in the last 168 hours.  Invalid input(s): GFRCGP ------------------------------------------------------------------------------------------------------------------  Cardiac Enzymes No results for input(s): TROPONINI in the last 168 hours. ------------------------------------------------------------  RADIOLOGY:  No results found.     Thank  you for the consultation and for allowing [facility] to assist in the care of your patient. Our recommendations are noted above.  Please contact us if we can be  of further service.   Wells Guileseep Taelor Waymire, M.D., F.C.C.P.  Board  Certified in Internal Medicine, Pulmonary Medicine, Critical Care Medicine, and Sleep Medicine.  [place] [facility] Office Number: [phone]   04/13/2018

## 2018-04-14 ENCOUNTER — Encounter: Payer: Self-pay | Admitting: Internal Medicine

## 2018-04-14 ENCOUNTER — Ambulatory Visit (INDEPENDENT_AMBULATORY_CARE_PROVIDER_SITE_OTHER): Payer: 59 | Admitting: Internal Medicine

## 2018-04-14 VITALS — BP 118/80 | HR 71 | Resp 16 | Ht 65.0 in | Wt 196.0 lb

## 2018-04-14 DIAGNOSIS — K219 Gastro-esophageal reflux disease without esophagitis: Secondary | ICD-10-CM | POA: Diagnosis not present

## 2018-04-14 DIAGNOSIS — J4540 Moderate persistent asthma, uncomplicated: Secondary | ICD-10-CM | POA: Diagnosis not present

## 2018-04-14 DIAGNOSIS — R0600 Dyspnea, unspecified: Secondary | ICD-10-CM | POA: Diagnosis not present

## 2018-04-14 DIAGNOSIS — G4733 Obstructive sleep apnea (adult) (pediatric): Secondary | ICD-10-CM | POA: Diagnosis not present

## 2018-04-14 MED ORDER — TIOTROPIUM BROMIDE MONOHYDRATE 18 MCG IN CAPS: 18.0000 ug | ORAL_CAPSULE | Freq: Every day | RESPIRATORY_TRACT | 6 refills | Status: DC

## 2018-04-14 NOTE — Addendum Note (Signed)
Addended by: Janean SarkSNIPES, Denise Washburn K on: 04/14/2018 10:45 AM   Modules accepted: Orders

## 2018-04-14 NOTE — Patient Instructions (Addendum)
Restart spiriva.  Will need to obtain result from your RAST testing.  Please call your CPAP supplier to get new supplies so you can restart cpap.

## 2018-04-16 ENCOUNTER — Encounter: Payer: Self-pay | Admitting: Internal Medicine

## 2018-04-17 ENCOUNTER — Ambulatory Visit: Payer: 59 | Attending: Family | Admitting: Family

## 2018-04-17 ENCOUNTER — Encounter: Payer: Self-pay | Admitting: Family

## 2018-04-17 VITALS — BP 159/96 | HR 85 | Resp 18 | Ht 65.0 in | Wt 203.0 lb

## 2018-04-17 DIAGNOSIS — Z8249 Family history of ischemic heart disease and other diseases of the circulatory system: Secondary | ICD-10-CM | POA: Insufficient documentation

## 2018-04-17 DIAGNOSIS — J45909 Unspecified asthma, uncomplicated: Secondary | ICD-10-CM | POA: Diagnosis not present

## 2018-04-17 DIAGNOSIS — R42 Dizziness and giddiness: Secondary | ICD-10-CM | POA: Diagnosis not present

## 2018-04-17 DIAGNOSIS — Z791 Long term (current) use of non-steroidal anti-inflammatories (NSAID): Secondary | ICD-10-CM | POA: Insufficient documentation

## 2018-04-17 DIAGNOSIS — Z9049 Acquired absence of other specified parts of digestive tract: Secondary | ICD-10-CM | POA: Diagnosis not present

## 2018-04-17 DIAGNOSIS — Z794 Long term (current) use of insulin: Secondary | ICD-10-CM | POA: Insufficient documentation

## 2018-04-17 DIAGNOSIS — Z79899 Other long term (current) drug therapy: Secondary | ICD-10-CM | POA: Insufficient documentation

## 2018-04-17 DIAGNOSIS — K219 Gastro-esophageal reflux disease without esophagitis: Secondary | ICD-10-CM | POA: Insufficient documentation

## 2018-04-17 DIAGNOSIS — E119 Type 2 diabetes mellitus without complications: Secondary | ICD-10-CM | POA: Diagnosis not present

## 2018-04-17 DIAGNOSIS — Z90710 Acquired absence of both cervix and uterus: Secondary | ICD-10-CM | POA: Insufficient documentation

## 2018-04-17 DIAGNOSIS — R002 Palpitations: Secondary | ICD-10-CM | POA: Insufficient documentation

## 2018-04-17 DIAGNOSIS — Z7982 Long term (current) use of aspirin: Secondary | ICD-10-CM | POA: Diagnosis not present

## 2018-04-17 DIAGNOSIS — E876 Hypokalemia: Secondary | ICD-10-CM | POA: Diagnosis not present

## 2018-04-17 DIAGNOSIS — Z888 Allergy status to other drugs, medicaments and biological substances status: Secondary | ICD-10-CM | POA: Insufficient documentation

## 2018-04-17 DIAGNOSIS — Z880 Allergy status to penicillin: Secondary | ICD-10-CM | POA: Diagnosis not present

## 2018-04-17 DIAGNOSIS — I10 Essential (primary) hypertension: Secondary | ICD-10-CM

## 2018-04-17 DIAGNOSIS — I890 Lymphedema, not elsewhere classified: Secondary | ICD-10-CM | POA: Insufficient documentation

## 2018-04-17 DIAGNOSIS — Z9889 Other specified postprocedural states: Secondary | ICD-10-CM | POA: Insufficient documentation

## 2018-04-17 DIAGNOSIS — E079 Disorder of thyroid, unspecified: Secondary | ICD-10-CM | POA: Diagnosis not present

## 2018-04-17 DIAGNOSIS — G4733 Obstructive sleep apnea (adult) (pediatric): Secondary | ICD-10-CM | POA: Diagnosis not present

## 2018-04-17 DIAGNOSIS — I110 Hypertensive heart disease with heart failure: Secondary | ICD-10-CM | POA: Insufficient documentation

## 2018-04-17 DIAGNOSIS — M797 Fibromyalgia: Secondary | ICD-10-CM | POA: Diagnosis not present

## 2018-04-17 DIAGNOSIS — Z833 Family history of diabetes mellitus: Secondary | ICD-10-CM | POA: Insufficient documentation

## 2018-04-17 DIAGNOSIS — I5032 Chronic diastolic (congestive) heart failure: Secondary | ICD-10-CM | POA: Diagnosis not present

## 2018-04-17 DIAGNOSIS — Z8673 Personal history of transient ischemic attack (TIA), and cerebral infarction without residual deficits: Secondary | ICD-10-CM | POA: Diagnosis not present

## 2018-04-17 LAB — GLUCOSE, CAPILLARY: Glucose-Capillary: 176 mg/dL — ABNORMAL HIGH (ref 70–99)

## 2018-04-17 NOTE — Progress Notes (Signed)
Patient ID: Amanda [name], female    DOB: [birth year], 52 y.o.   MRN: [id number]  HPI  [name] is a 52 y/o female with a history of asthma, DM, HTN, stroke, thyroid disease, GERD, fibromyalgia, migraines, obstructive sleep apnea and chronic heart failure.   Echo report from 03/29/18 reviewed and showed an EF of 55-60%.  Admitted 03/28/18 due to chest pain. Cardiology consult obtained. CXR and lab work were negative. No evidence of PE with a CT. Discharged after 2 days.   She presents today for her initial visit with a chief complaint of moderate fatigue upon minimal exertion. She describes this as chronic in nature having been present for several years. She has associated shortness of breath, chest pain, pedal edema, palpitations, light-headedness and difficulty sleeping along with this. She denies any abdominal distention or weight gain. Has been wearing her compression socks at bedtime with removal in the mornings. Needs some CPAP replacement parts.   Past Medical History:  Diagnosis Date  . Arthritis    knees, shoulder, upper back  . Asthma   . CHF (congestive heart failure) (HCC)   . Diabetes mellitus without complication (HCC)   . Environmental allergies   . Fibromyalgia   . GERD (gastroesophageal reflux disease)   . Headache    migraines - 5x/mo  . Hypertension   . Hypokalemia 10/14/2017  . Migraines   . Motion sickness    ships  . Sleep apnea   . Stroke [facility])    no residual deficits  . Thyroid nodule    bilateral and goiter  . Wears contact lenses   . Wears dentures    partial upper   Past Surgical History:  Procedure Laterality Date  . ABDOMINAL HYSTERECTOMY  2000's  . ABDOMINAL SURGERY     laparoscopy x4 with lysis of adhesions  . APPENDECTOMY  1986  . CESAREAN SECTION  1992  . CHOLECYSTECTOMY N/A 04/03/2017   Procedure: LAPAROSCOPIC CHOLECYSTECTOMY;  Surgeon: Henrene Dodge, MD;  Location: [facility] ORS;  Service: General;  Laterality: N/A;  . COLONOSCOPY WITH  PROPOFOL N/A 03/10/2017   Procedure: COLONOSCOPY WITH PROPOFOL;  Surgeon: Scot Jun, MD;  Location: [facility] ENDOSCOPY;  Service: Endoscopy;  Laterality: N/A;  . ECTOPIC PREGNANCY SURGERY  2000's  . ESOPHAGOGASTRODUODENOSCOPY (EGD) WITH PROPOFOL N/A 03/10/2017   Procedure: ESOPHAGOGASTRODUODENOSCOPY (EGD) WITH PROPOFOL;  Surgeon: Scot Jun, MD;  Location: [facility] ENDOSCOPY;  Service: Endoscopy;  Laterality: N/A;  . HERNIA REPAIR    . JOINT REPLACEMENT Right 12/16/12   knee- medial - makoplasty  . KNEE ARTHROSCOPY Right 2006   partial medial and lateral meniscectomies  . KNEE ARTHROSCOPY Right 10/06/2015   Procedure: RIGHT KNEE ARTHROSCOPY WITH DEBRIDEMENT;  Surgeon: Erin Sons, MD;  Location: [facility] SURGERY CNTR;  Service: Orthopedics;  Laterality: Right;  Diabetic - insulin and oral meds  . ROTATOR CUFF REPAIR Left 2006  . TUBAL LIGATION  2000's   Family History  Problem Relation Age of Onset  . Hypertension Mother   . Hypertension Father   . Diabetes Father   . Allergies Father   . Breast cancer Neg Hx    Social History   Tobacco Use  . Smoking status: Never Smoker  . Smokeless tobacco: Never Used  Substance Use Topics  . Alcohol use: No   Allergies  Allergen Reactions  . Penicillins Anaphylaxis    Has patient had a PCN reaction causing immediate rash, facial/tongue/throat swelling, SOB or lightheadedness with hypotension: Yes Has patient had  a PCN reaction causing severe rash involving mucus membranes or skin necrosis: No Has patient had a PCN reaction that required hospitalization No Has patient had a PCN reaction occurring within the last 10 years: No If all of the above answers are "NO", then may proceed with Cephalosporin use.   . Ace Inhibitors Swelling   Prior to Admission medications   Medication Sig Start Date End Date Taking? Authorizing Provider  albuterol (PROVENTIL HFA) 108 (90 Base) MCG/ACT inhaler Inhale 2 puffs into the lungs every 4 (four) hours  as needed for wheezing or shortness of breath. 11/06/17  Yes Shane Crutch, MD  Ascorbic Acid (VITAMIN C) 1000 MG tablet Take 500 mg by mouth 2 (two) times daily.    Yes [provider]  aspirin EC 81 MG tablet Take 1 tablet (81 mg total) by mouth daily. Patient taking differently: Take 162 mg by mouth daily.  10/17/17  Yes Johnson, Clanford L, MD  BREO ELLIPTA 200-25 MCG/INH AEPB Inhale 1 puff into the lungs daily. 12/25/17  Yes Shane Crutch, MD  canagliflozin (INVOKANA) 300 MG TABS tablet Take 300 mg by mouth daily before breakfast.   Yes [provider]  flunisolide (NASALIDE) 25 MCG/ACT (0.025%) SOLN Place 2 sprays into the nose daily as needed.   Yes [provider]  furosemide (LASIX) 40 MG tablet Take 40 mg by mouth 2 (two) times daily.    Yes [provider]  gabapentin (NEURONTIN) 300 MG capsule Take 600 mg by mouth 2 (two) times daily. 03/22/18  Yes [provider]  insulin detemir (LEVEMIR) 100 UNIT/ML injection Inject 12 Units into the skin daily.    Yes [provider]  ipratropium-albuterol (DUONEB) 0.5-2.5 (3) MG/3ML SOLN Inhale contents of 1 vial via nebulizer 3 times daily as needed for shortness of breath/wheeze/cough. 11/21/17  Yes Shane Crutch, MD  isosorbide mononitrate (IMDUR) 30 MG 24 hr tablet Take 1 tablet (30 mg total) by mouth daily. 10/17/17  Yes Johnson, Clanford L, MD  lidocaine (LIDODERM) 5 % Place 1 patch onto the skin daily. (remove after 12 hours and apply new patch 12 hours later) 02/24/18  Yes [provider]  LINZESS 290 MCG CAPS capsule Take 1 tablet by mouth daily before breakfast.  02/28/17  Yes [provider]  meloxicam (MOBIC) 7.5 MG tablet Take 1 tablet (7.5 mg total) by mouth daily. Patient taking differently: Take 15 mg by mouth daily.  02/12/18 02/12/19 Yes Enid Derry, PA-C  metoprolol succinate (TOPROL-XL) 100 MG 24 hr tablet Take 100 mg by mouth daily. Take with  or immediately following a meal.    Yes [provider]  mirtazapine (REMERON SOL-TAB) 30 MG disintegrating tablet Take 30 mg by mouth at bedtime. 03/18/18  Yes [provider]  montelukast (SINGULAIR) 10 MG tablet Take 1 tablet (10 mg total) by mouth daily. 12/26/17 12/26/18 Yes Shane Crutch, MD  Multiple Vitamin (MULTIVITAMIN) tablet Take 1 tablet by mouth daily.   Yes [provider]  omeprazole (PRILOSEC) 40 MG capsule Take 1 capsule (40 mg total) by mouth daily. 1 hr before supper 04/05/17  Yes Piscoya, Jose, MD  Potassium Chloride ER 20 MEQ TBCR Take 20 mEq by mouth 2 (two) times daily.  03/11/18  Yes [provider]  pravastatin (PRAVACHOL) 80 MG tablet Take 1 tablet (80 mg total) by mouth daily. Patient taking differently: Take 40 mg by mouth daily.  10/17/17 04/17/18 Yes Johnson, Clanford L, MD  sucralfate (CARAFATE) 1 g tablet Take  1 g by mouth 2 (two) times daily.    Yes [provider]  SUMAtriptan (IMITREX) 25 MG tablet Take 25 mg by mouth every 2 (two) hours as needed for migraine. May repeat in 2 hours if headache persists or recurs.   Yes [provider]  tiotropium (SPIRIVA HANDIHALER) 18 MCG inhalation capsule Place 1 capsule (18 mcg total) into inhaler and inhale daily. 04/14/18 04/14/19 Yes Shane Crutchamachandran, Pradeep, MD  tiZANidine (ZANAFLEX) 4 MG tablet Take 4 mg by mouth every 6 (six) hours as needed for muscle spasms.   Yes [provider]  zolpidem (AMBIEN) 10 MG tablet Take 10 mg by mouth at bedtime as needed for sleep.    Yes [provider]    Review of Systems  Constitutional: Positive for fatigue (easily). Negative for appetite change.  HENT: Positive for congestion. Negative for postnasal drip and sore throat.   Eyes: Negative.   Respiratory: Positive for shortness of breath (easily). Negative for chest tightness.   Cardiovascular: Positive for chest pain (at times with overexertion), palpitations and  leg swelling.  Gastrointestinal: Negative for abdominal distention and abdominal pain.  Endocrine: Negative.   Genitourinary: Negative.   Musculoskeletal: Positive for arthralgias (leg pain). Negative for neck pain.  Skin: Negative.   Allergic/Immunologic: Negative.   Neurological: Positive for light-headedness. Negative for dizziness.  Hematological: Negative for adenopathy. Does not bruise/bleed easily.  Psychiatric/Behavioral: Positive for sleep disturbance (not wearing CPAP). Negative for dysphoric mood. The patient is not nervous/anxious.     Vitals:   04/17/18 0931  BP: (!) 159/96  Pulse: 85  Resp: 18  SpO2: 100%  Weight: 203 lb (92.1 kg)  Height: 5\' 5"  (1.651 m)   Wt Readings from Last 3 Encounters:  04/17/18 203 lb (92.1 kg)  04/14/18 196 lb (88.9 kg)  03/28/18 204 lb 1.6 oz (92.6 kg)   Lab Results  Component Value Date   CREATININE 0.65 03/29/2018   CREATININE 0.80 03/28/2018   CREATININE 0.80 10/16/2017   Physical Exam  Constitutional: She is oriented to person, place, and time. She appears well-developed and well-nourished.  HENT:  Head: Normocephalic and atraumatic.  Neck: Normal range of motion. Neck supple. No thyromegaly present.  Cardiovascular: Normal rate and regular rhythm.  Pulmonary/Chest: Effort normal. No respiratory distress. She has no wheezes. She has no rales.  Abdominal: Soft. She exhibits no distension.  Musculoskeletal:       Right lower leg: She exhibits edema (2+ pitting). She exhibits no tenderness.       Left lower leg: She exhibits edema (2+ pitting). She exhibits no tenderness.  Neurological: She is alert and oriented to person, place, and time.  Skin: Skin is warm and dry.  Psychiatric: She has a normal mood and affect. Her behavior is normal.  Nursing note and vitals reviewed.  Assessment & Plan:  1: Chronic heart failure with preserved ejection fraction- - NYHA class III - mildly fluid overloaded today with pedal edema -  weighing daily and she was instructed to call for an overnight weight gain of >2 pounds or a weekly weight gain of >5 pounds - follows with cardiology [name](Khan) and saw him last week; scheduled for a stress test 04/23/18 (per patient) - not adding salt but admits to eating salty foods (ate hotdog with mustard/ketchup/slaw) for breakfast this morning. Reviewed the importance of reading food labels so that she can keep her daily sodium intake to 2000mg  / day. Written dietary information was given to her about this  as well - BNP 03/28/18 was 11.0 - Pharm D reconciled medications with the patient  2: HTN- - BP mildly elevated here today - sees PCP Letta Pate(Aycock) 04/24/18 - BMP 03/29/18 reviewed and showed sodium 143, potassium 4.0 and GFR >60  3: DM- - nonfasting glucose in clinic was 176 - A1c 10/14/17 was 7.6%  4: Obstructive sleep apnea- - saw pulmonolgist Nicholos Johns(Ramachandran) 04/14/18 - needs some new CPAP parts; advised patient to call CPAP company and ask them to call us with the parts needed and cost of these parts and then we could see if [facility] pay for them.   5: Lymphedema- - stage 2 - limited in exercise due to symptoms - instructed her to wear her compression socks during the day with removal at bedtime - encouraged her to elevate her legs when sitting for long periods of time - consider lymphapress compression boots if edema persists  Patient did not bring her medications nor a list. Each medication was verbally reviewed with the patient and she was encouraged to bring the bottles to every visit to confirm accuracy of list.  Return in 6 weeks or sooner for any questions/problems before then.

## 2018-04-17 NOTE — Patient Instructions (Signed)
Continue weighing daily and call for an overnight weight gain of > 2 pounds or a weekly weight gain of >5 pounds. 

## 2018-04-23 ENCOUNTER — Other Ambulatory Visit: Payer: Self-pay | Admitting: Internal Medicine

## 2018-04-24 ENCOUNTER — Other Ambulatory Visit: Payer: Self-pay | Admitting: Internal Medicine

## 2018-05-22 ENCOUNTER — Ambulatory Visit: Payer: 59 | Attending: Neurology

## 2018-05-25 ENCOUNTER — Ambulatory Visit: Payer: 59 | Attending: Family | Admitting: Family

## 2018-05-25 ENCOUNTER — Encounter: Payer: Self-pay | Admitting: Family

## 2018-05-25 VITALS — BP 151/88 | HR 77 | Resp 18 | Ht 65.0 in | Wt 199.5 lb

## 2018-05-25 DIAGNOSIS — K219 Gastro-esophageal reflux disease without esophagitis: Secondary | ICD-10-CM | POA: Insufficient documentation

## 2018-05-25 DIAGNOSIS — J45909 Unspecified asthma, uncomplicated: Secondary | ICD-10-CM | POA: Insufficient documentation

## 2018-05-25 DIAGNOSIS — Z8249 Family history of ischemic heart disease and other diseases of the circulatory system: Secondary | ICD-10-CM | POA: Insufficient documentation

## 2018-05-25 DIAGNOSIS — E079 Disorder of thyroid, unspecified: Secondary | ICD-10-CM | POA: Diagnosis not present

## 2018-05-25 DIAGNOSIS — Z794 Long term (current) use of insulin: Secondary | ICD-10-CM | POA: Diagnosis not present

## 2018-05-25 DIAGNOSIS — Z96651 Presence of right artificial knee joint: Secondary | ICD-10-CM | POA: Diagnosis not present

## 2018-05-25 DIAGNOSIS — M1990 Unspecified osteoarthritis, unspecified site: Secondary | ICD-10-CM | POA: Insufficient documentation

## 2018-05-25 DIAGNOSIS — G43909 Migraine, unspecified, not intractable, without status migrainosus: Secondary | ICD-10-CM | POA: Diagnosis not present

## 2018-05-25 DIAGNOSIS — E119 Type 2 diabetes mellitus without complications: Secondary | ICD-10-CM | POA: Diagnosis not present

## 2018-05-25 DIAGNOSIS — G4733 Obstructive sleep apnea (adult) (pediatric): Secondary | ICD-10-CM | POA: Insufficient documentation

## 2018-05-25 DIAGNOSIS — Z8673 Personal history of transient ischemic attack (TIA), and cerebral infarction without residual deficits: Secondary | ICD-10-CM | POA: Diagnosis not present

## 2018-05-25 DIAGNOSIS — M797 Fibromyalgia: Secondary | ICD-10-CM | POA: Insufficient documentation

## 2018-05-25 DIAGNOSIS — Z7982 Long term (current) use of aspirin: Secondary | ICD-10-CM | POA: Insufficient documentation

## 2018-05-25 DIAGNOSIS — I890 Lymphedema, not elsewhere classified: Secondary | ICD-10-CM | POA: Insufficient documentation

## 2018-05-25 DIAGNOSIS — I110 Hypertensive heart disease with heart failure: Secondary | ICD-10-CM | POA: Diagnosis not present

## 2018-05-25 DIAGNOSIS — Z79899 Other long term (current) drug therapy: Secondary | ICD-10-CM | POA: Diagnosis not present

## 2018-05-25 DIAGNOSIS — I509 Heart failure, unspecified: Secondary | ICD-10-CM | POA: Insufficient documentation

## 2018-05-25 DIAGNOSIS — I10 Essential (primary) hypertension: Secondary | ICD-10-CM

## 2018-05-25 DIAGNOSIS — I5032 Chronic diastolic (congestive) heart failure: Secondary | ICD-10-CM

## 2018-05-25 NOTE — Progress Notes (Signed)
Patient ID: Amanda [name], female    DOB: March 06, 1966, 52 y.o.   MRN: [id number]  HPI  [name] is a 52 y/o female with a history of asthma, DM, HTN, stroke, thyroid disease, GERD, fibromyalgia, migraines, obstructive sleep apnea and chronic heart failure.   Echo report from 03/29/18 reviewed and showed an EF of 55-60%.  Admitted 03/28/18 due to chest pain. Cardiology consult obtained. CXR and lab work were negative. No evidence of PE with a CT. Discharged after 2 days.   She presents today for a follow-up visit with a chief complaint of moderate fatigue upon minimal exertion. She describes this as chronic having been present for several years. She has associated cough, shortness of breath, head congestion, intermittent chest pain, pedal edema, palpitations, light-headedness and difficulty sleeping along with this. She denies any abdominal distention or weight gain. She did miss her sleep study test and has a message in to get it rescheduled.   Past Medical History:  Diagnosis Date  . Arthritis    knees, shoulder, upper back  . Asthma   . CHF (congestive heart failure) (HCC)   . Diabetes mellitus without complication (HCC)   . Environmental allergies   . Fibromyalgia   . GERD (gastroesophageal reflux disease)   . Headache    migraines - 5x/mo  . Hypertension   . Hypokalemia 10/14/2017  . Migraines   . Motion sickness    ships  . Sleep apnea   . Stroke [facility])    no residual deficits  . Thyroid nodule    bilateral and goiter  . Wears contact lenses   . Wears dentures    partial upper   Past Surgical History:  Procedure Laterality Date  . ABDOMINAL HYSTERECTOMY  2000's  . ABDOMINAL SURGERY     laparoscopy x4 with lysis of adhesions  . APPENDECTOMY  1986  . CESAREAN SECTION  1992  . CHOLECYSTECTOMY N/A 04/03/2017   Procedure: LAPAROSCOPIC CHOLECYSTECTOMY;  Surgeon: Henrene Dodge, MD;  Location: [facility] ORS;  Service: General;  Laterality: N/A;  . COLONOSCOPY WITH PROPOFOL N/A  03/10/2017   Procedure: COLONOSCOPY WITH PROPOFOL;  Surgeon: Scot Jun, MD;  Location: [facility] ENDOSCOPY;  Service: Endoscopy;  Laterality: N/A;  . ECTOPIC PREGNANCY SURGERY  2000's  . ESOPHAGOGASTRODUODENOSCOPY (EGD) WITH PROPOFOL N/A 03/10/2017   Procedure: ESOPHAGOGASTRODUODENOSCOPY (EGD) WITH PROPOFOL;  Surgeon: Scot Jun, MD;  Location: [facility] ENDOSCOPY;  Service: Endoscopy;  Laterality: N/A;  . HERNIA REPAIR    . JOINT REPLACEMENT Right 12/16/12   knee- medial - makoplasty  . KNEE ARTHROSCOPY Right 2006   partial medial and lateral meniscectomies  . KNEE ARTHROSCOPY Right 10/06/2015   Procedure: RIGHT KNEE ARTHROSCOPY WITH DEBRIDEMENT;  Surgeon: Erin Sons, MD;  Location: [facility] SURGERY CNTR;  Service: Orthopedics;  Laterality: Right;  Diabetic - insulin and oral meds  . ROTATOR CUFF REPAIR Left 2006  . TUBAL LIGATION  2000's   Family History  Problem Relation Age of Onset  . Hypertension Mother   . Hypertension Father   . Diabetes Father   . Allergies Father   . Breast cancer Neg Hx    Social History   Tobacco Use  . Smoking status: Never Smoker  . Smokeless tobacco: Never Used  Substance Use Topics  . Alcohol use: No   Allergies  Allergen Reactions  . Penicillins Anaphylaxis    Has patient had a PCN reaction causing immediate rash, facial/tongue/throat swelling, SOB or lightheadedness with hypotension: Yes Has patient had  a PCN reaction causing severe rash involving mucus membranes or skin necrosis: No Has patient had a PCN reaction that required hospitalization No Has patient had a PCN reaction occurring within the last 10 years: No If all of the above answers are "NO", then may proceed with Cephalosporin use.   . Ace Inhibitors Swelling   Prior to Admission medications   Medication Sig Start Date End Date Taking? Authorizing Provider  albuterol (PROVENTIL HFA) 108 (90 Base) MCG/ACT inhaler Inhale 2 puffs into the lungs every 4 (four) hours as needed for  wheezing or shortness of breath. 11/06/17  Yes Shane Crutch, MD  Ascorbic Acid (VITAMIN C) 1000 MG tablet Take 500 mg by mouth 2 (two) times daily.    Yes [provider]  aspirin EC 81 MG tablet Take 1 tablet (81 mg total) by mouth daily. Patient taking differently: Take 162 mg by mouth daily.  10/17/17  Yes Johnson, Clanford L, MD  BREO ELLIPTA 200-25 MCG/INH AEPB Inhale 1 puff into the lungs daily. 04/23/18  Yes Shane Crutch, MD  canagliflozin (INVOKANA) 300 MG TABS tablet Take 300 mg by mouth daily before breakfast.   Yes [provider]  flunisolide (NASALIDE) 25 MCG/ACT (0.025%) SOLN Place 2 sprays into the nose daily as needed.   Yes [provider]  furosemide (LASIX) 40 MG tablet Take 40 mg by mouth 2 (two) times daily.    Yes [provider]  gabapentin (NEURONTIN) 300 MG capsule Take 600 mg by mouth 2 (two) times daily. 03/22/18  Yes [provider]  insulin detemir (LEVEMIR) 100 UNIT/ML injection Inject 12 Units into the skin daily.    Yes [provider]  ipratropium-albuterol (DUONEB) 0.5-2.5 (3) MG/3ML SOLN Inhale contents of 1 vial via nebulizer 3 times daily as needed for shortness of breath/wheeze/cough. 11/21/17  Yes Shane Crutch, MD  isosorbide mononitrate (IMDUR) 30 MG 24 hr tablet Take 1 tablet (30 mg total) by mouth daily. 10/17/17  Yes Johnson, Clanford L, MD  lidocaine (LIDODERM) 5 % Place 1 patch onto the skin daily. (remove after 12 hours and apply new patch 12 hours later) 02/24/18  Yes [provider]  LINZESS 290 MCG CAPS capsule Take 1 tablet by mouth daily before breakfast.  02/28/17  Yes [provider]  meloxicam (MOBIC) 7.5 MG tablet Take 1 tablet (7.5 mg total) by mouth daily. Patient taking differently: Take 15 mg by mouth daily.  02/12/18 02/12/19 Yes Enid Derry, PA-C  metoprolol succinate (TOPROL-XL) 100 MG 24 hr tablet Take 100 mg by mouth daily. Take with or immediately  following a meal.    Yes [provider]  mirtazapine (REMERON SOL-TAB) 30 MG disintegrating tablet Take 30 mg by mouth at bedtime. 03/18/18  Yes [provider]  montelukast (SINGULAIR) 10 MG tablet Take 1 tablet (10 mg total) by mouth daily. 04/24/18 04/24/19 Yes Shane Crutch, MD  Multiple Vitamin (MULTIVITAMIN) tablet Take 1 tablet by mouth daily.   Yes [provider]  omeprazole (PRILOSEC) 40 MG capsule Take 1 capsule (40 mg total) by mouth daily. 1 hr before supper 04/05/17  Yes Piscoya, Jose, MD  Potassium Chloride ER 20 MEQ TBCR Take 20 mEq by mouth 2 (two) times daily.  03/11/18  Yes [provider]  sucralfate (CARAFATE) 1 g tablet Take 1 g by mouth 2 (two) times daily.    Yes [provider]  SUMAtriptan (IMITREX) 25 MG tablet Take 25 mg by mouth every 2 (two) hours as needed  for migraine. May repeat in 2 hours if headache persists or recurs.   Yes [provider]  tiotropium (SPIRIVA HANDIHALER) 18 MCG inhalation capsule Place 1 capsule (18 mcg total) into inhaler and inhale daily. 04/14/18 04/14/19 Yes Shane Crutch, MD  tiZANidine (ZANAFLEX) 4 MG tablet Take 4 mg by mouth every 6 (six) hours as needed for muscle spasms.   Yes [provider]  zolpidem (AMBIEN) 10 MG tablet Take 10 mg by mouth at bedtime as needed for sleep.    Yes [provider]  pravastatin (PRAVACHOL) 80 MG tablet Take 1 tablet (80 mg total) by mouth daily. Patient taking differently: Take 40 mg by mouth daily.  10/17/17 04/17/18  Cleora Fleet, MD   Review of Systems  Constitutional: Positive for fatigue (easily). Negative for appetite change.  HENT: Positive for congestion. Negative for postnasal drip and sore throat.   Eyes: Negative.   Respiratory: Positive for cough (dry cough) and shortness of breath (easily). Negative for chest tightness.   Cardiovascular: Positive for chest pain (at times with overexertion), palpitations  and leg swelling.  Gastrointestinal: Negative for abdominal distention and abdominal pain.  Endocrine: Negative.   Genitourinary: Negative.   Musculoskeletal: Positive for arthralgias (feet hurting). Negative for neck pain.  Skin: Negative.   Allergic/Immunologic: Negative.   Neurological: Positive for light-headedness. Negative for dizziness.  Hematological: Negative for adenopathy. Does not bruise/bleed easily.  Psychiatric/Behavioral: Positive for sleep disturbance (not wearing CPAP). Negative for dysphoric mood. The patient is not nervous/anxious.    Vitals:   05/25/18 1055  BP: (!) 151/88  Pulse: 77  Resp: 18  SpO2: 98%  Weight: 199 lb 8 oz (90.5 kg)  Height: 5\' 5"  (1.651 m)   Wt Readings from Last 3 Encounters:  05/25/18 199 lb 8 oz (90.5 kg)  04/17/18 203 lb (92.1 kg)  04/14/18 196 lb (88.9 kg)   Lab Results  Component Value Date   CREATININE 0.65 03/29/2018   CREATININE 0.80 03/28/2018   CREATININE 0.80 10/16/2017    Physical Exam  Constitutional: She is oriented to person, place, and time. She appears well-developed and well-nourished.  HENT:  Head: Normocephalic and atraumatic.  Neck: Normal range of motion. Neck supple. No thyromegaly present.  Cardiovascular: Normal rate and regular rhythm.  Pulmonary/Chest: Effort normal. No respiratory distress. She has no wheezes. She has no rales.  Abdominal: Soft. She exhibits no distension.  Musculoskeletal:       Right lower leg: She exhibits edema (2+ pitting). She exhibits no tenderness.       Left lower leg: She exhibits edema (2+ pitting). She exhibits no tenderness.  Neurological: She is alert and oriented to person, place, and time.  Skin: Skin is warm and dry.  Psychiatric: She has a normal mood and affect. Her behavior is normal.  Nursing note and vitals reviewed.  Assessment & Plan:  1: Chronic heart failure with preserved ejection fraction- - NYHA class III - mildly fluid overloaded today with pedal  edema - weighing daily and she was reminded to call for an overnight weight gain of >2 pounds or a weekly weight gain of >5 pounds - weight down 4 pounds since she was last here - follows with cardiology Amanda [name]) had a stress test done since she was here last - not adding salt and it was reviewed about keeping daily sodium intake to 2000mg  daily - reviewed fluid intake and keeping daily intake to 60-64 ounces. Patient says that she probably drinks twice that  amount because of her dry mouth. Encouraged her to try sucking on sugar free candy instead of drinking so much fluid. - she is to take an additional  40mg  furosemide and 20meq potassium both today and tomorrow - will check BMP next week due to taking additional diuretic - BNP 03/28/18 was 11.0  2: HTN- - BP slightly elevated but little bit better from last visit - sees PCP Amanda [name](Amanda [name]) 04/24/18 - BMP 03/29/18 reviewed and showed sodium 143, potassium 4.0 and GFR >60  3: DM- - fasting glucose at home yesterday was 103 - A1c 10/14/17 was 7.6%  4: Obstructive sleep apnea- - saw pulmonolgist Amanda [name](Amanda [name]) 04/14/18 - says that she missed her sleep study appointment but has a message in to them about rescheduling it  5: Lymphedema- - stage 2 - limited in exercise due to symptoms - has been wearing compression socks with removal at bedtime but edema persists - has been elevating her legs but, again, edema persists - will make referral for lymphapress compression boots   Patient did not bring her medications nor a list. Each medication was verbally reviewed with the patient and she was encouraged to bring the bottles to every visit to confirm accuracy of list.  Return in 1 week or sooner for any questions/problems before then.

## 2018-05-25 NOTE — Patient Instructions (Addendum)
Continue weighing daily and call for an overnight weight gain of > 2 pounds or a weekly weight gain of >5 pounds.  Take an extra fluid pill and potassium pill today and tomorrow   Decrease fluid intake to 60-64 ounces of fluid daily.    Can suck on sugar free candy to help with dry mouth.

## 2018-05-26 ENCOUNTER — Encounter: Payer: Self-pay | Admitting: Family

## 2018-05-28 ENCOUNTER — Ambulatory Visit: Payer: 59 | Admitting: Family

## 2018-05-29 ENCOUNTER — Ambulatory Visit: Payer: 59 | Admitting: Family

## 2018-06-02 NOTE — Progress Notes (Signed)
Patient ID: [name], female    DOB: December 12, 1965, 52 y.o.   MRN: [id number]  HPI  [name] is a 52 y/o female with a history of asthma, DM, HTN, stroke, thyroid disease, GERD, fibromyalgia, migraines, obstructive sleep apnea and chronic heart failure.   Echo report from 03/29/18 reviewed and showed an EF of 55-60%.  Admitted 03/28/18 due to chest pain. Cardiology consult obtained. CXR and lab work were negative. No evidence of PE with a CT. Discharged after 2 days.   She presents today for a follow-up visit with a chief complaint of moderate shortness of breath upon minimal exertion. She describes this as chronic in nature having been present for several years. She has associated fatigue, head congestion, infrequent chest pain, palpitations, light-headedness and difficulty sleeping along with this. She denies any abdominal distention, pedal edema or weight gain. Took the extra diuretic for a few days and feels a little bit better.   Past Medical History:  Diagnosis Date  . Arthritis    knees, shoulder, upper back  . Asthma   . CHF (congestive heart failure) (HCC)   . Diabetes mellitus without complication (HCC)   . Environmental allergies   . Fibromyalgia   . GERD (gastroesophageal reflux disease)   . Headache    migraines - 5x/mo  . Hypertension   . Hypokalemia 10/14/2017  . Migraines   . Motion sickness    ships  . Sleep apnea   . Stroke [facility])    no residual deficits  . Thyroid nodule    bilateral and goiter  . Wears contact lenses   . Wears dentures    partial upper   Past Surgical History:  Procedure Laterality Date  . ABDOMINAL HYSTERECTOMY  2000's  . ABDOMINAL SURGERY     laparoscopy x4 with lysis of adhesions  . APPENDECTOMY  1986  . CESAREAN SECTION  1992  . CHOLECYSTECTOMY N/A 04/03/2017   Procedure: LAPAROSCOPIC CHOLECYSTECTOMY;  Surgeon: Henrene Dodge, MD;  Location: [facility] ORS;  Service: General;  Laterality: N/A;  . COLONOSCOPY WITH PROPOFOL N/A 03/10/2017    Procedure: COLONOSCOPY WITH PROPOFOL;  Surgeon: Scot Jun, MD;  Location: [facility] ENDOSCOPY;  Service: Endoscopy;  Laterality: N/A;  . ECTOPIC PREGNANCY SURGERY  2000's  . ESOPHAGOGASTRODUODENOSCOPY (EGD) WITH PROPOFOL N/A 03/10/2017   Procedure: ESOPHAGOGASTRODUODENOSCOPY (EGD) WITH PROPOFOL;  Surgeon: Scot Jun, MD;  Location: [facility] ENDOSCOPY;  Service: Endoscopy;  Laterality: N/A;  . HERNIA REPAIR    . JOINT REPLACEMENT Right 12/16/12   knee- medial - makoplasty  . KNEE ARTHROSCOPY Right 2006   partial medial and lateral meniscectomies  . KNEE ARTHROSCOPY Right 10/06/2015   Procedure: RIGHT KNEE ARTHROSCOPY WITH DEBRIDEMENT;  Surgeon: Erin Sons, MD;  Location: [facility] SURGERY [facility]TR;  Service: Orthopedics;  Laterality: Right;  Diabetic - insulin and oral meds  . ROTATOR CUFF REPAIR Left 2006  . TUBAL LIGATION  2000's   Family History  Problem Relation Age of Onset  . Hypertension Mother   . Hypertension Father   . Diabetes Father   . Allergies Father   . Breast cancer Neg Hx    Social History   Tobacco Use  . Smoking status: Never Smoker  . Smokeless tobacco: Never Used  Substance Use Topics  . Alcohol use: No   Allergies  Allergen Reactions  . Penicillins Anaphylaxis    Has patient had a PCN reaction causing immediate rash, facial/tongue/throat swelling, SOB or lightheadedness with hypotension: Yes Has patient had a  PCN reaction causing severe rash involving mucus membranes or skin necrosis: No Has patient had a PCN reaction that required hospitalization No Has patient had a PCN reaction occurring within the last 10 years: No If all of the above answers are "NO", then may proceed with Cephalosporin use.   . Ace Inhibitors Swelling   Prior to Admission medications   Medication Sig Start Date End Date Taking? Authorizing Provider  albuterol (PROVENTIL HFA) 108 (90 Base) MCG/ACT inhaler Inhale 2 puffs into the lungs every 4 (four) hours as needed for wheezing  or shortness of breath. 11/06/17  Yes Shane Crutch, MD  ARIPiprazole (ABILIFY) 10 MG tablet Take 10 mg by mouth daily.   Yes [provider]  Ascorbic Acid (VITAMIN C) 1000 MG tablet Take 500 mg by mouth 2 (two) times daily.    Yes [provider]  aspirin EC 81 MG tablet Take 1 tablet (81 mg total) by mouth daily. Patient taking differently: Take 162 mg by mouth daily.  10/17/17  Yes Johnson, Clanford L, MD  BREO ELLIPTA 200-25 MCG/INH AEPB Inhale 1 puff into the lungs daily. 04/23/18  Yes Shane Crutch, MD  canagliflozin (INVOKANA) 300 MG TABS tablet Take 300 mg by mouth daily before breakfast.   Yes [provider]  clonazePAM (KLONOPIN) 0.5 MG tablet Take 0.5 mg by mouth 2 (two) times daily.   Yes [provider]  DULoxetine (CYMBALTA) 60 MG capsule Take 60 mg by mouth 2 (two) times daily.   Yes [provider]  flunisolide (NASALIDE) 25 MCG/ACT (0.025%) SOLN Place 2 sprays into the nose daily as needed.   Yes [provider]  furosemide (LASIX) 40 MG tablet Take 40 mg by mouth 2 (two) times daily.    Yes [provider]  gabapentin (NEURONTIN) 300 MG capsule Take 600 mg by mouth 2 (two) times daily. 03/22/18  Yes [provider]  HYDROcodone-acetaminophen (NORCO/VICODIN) 5-325 MG tablet Take 1-2 tablets by mouth at bedtime as needed for moderate pain.   Yes [provider]  insulin detemir (LEVEMIR) 100 UNIT/ML injection Inject 12 Units into the skin daily.    Yes [provider]  ipratropium-albuterol (DUONEB) 0.5-2.5 (3) MG/3ML SOLN Inhale contents of 1 vial via nebulizer 3 times daily as needed for shortness of breath/wheeze/cough. 11/21/17  Yes Shane Crutch, MD  isosorbide mononitrate (IMDUR) 30 MG 24 hr tablet Take 1 tablet (30 mg total) by mouth daily. 10/17/17  Yes Johnson, Clanford L, MD  lidocaine (LIDODERM) 5 % Place 1 patch onto the skin daily. (remove after 12 hours and apply  new patch 12 hours later) 02/24/18  Yes [provider]  LINZESS 290 MCG CAPS capsule Take 1 tablet by mouth daily before breakfast.  02/28/17  Yes [provider]  meloxicam (MOBIC) 7.5 MG tablet Take 1 tablet (7.5 mg total) by mouth daily. Patient taking differently: Take 15 mg by mouth daily.  02/12/18 02/12/19 Yes Enid Derry, PA-C  metoprolol succinate (TOPROL-XL) 100 MG 24 hr tablet Take 125 mg by mouth daily. Take with or immediately following a meal.    Yes [provider]  mirtazapine (REMERON SOL-TAB) 30 MG disintegrating tablet Take 30 mg by mouth daily.  03/18/18  Yes [provider]  montelukast (SINGULAIR) 10 MG tablet Take 1 tablet (10 mg total) by mouth daily. 04/24/18 04/24/19 Yes Shane Crutch, MD  Multiple Vitamin (MULTIVITAMIN) tablet Take 1 tablet by mouth daily.   Yes [provider]  omeprazole (PRILOSEC) 40  MG capsule Take 1 capsule (40 mg total) by mouth daily. 1 hr before supper 04/05/17  Yes Piscoya, Jose, MD  Potassium Chloride ER 20 MEQ TBCR Take 20 mEq by mouth 2 (two) times daily.  03/11/18  Yes [provider]  pravastatin (PRAVACHOL) 80 MG tablet Take 1 tablet (80 mg total) by mouth daily. Patient taking differently: Take 40 mg by mouth daily.  10/17/17 06/04/19 Yes Johnson, Clanford L, MD  sucralfate (CARAFATE) 1 g tablet Take 1 g by mouth 2 (two) times daily.    Yes [provider]  SUMAtriptan (IMITREX) 25 MG tablet Take 25 mg by mouth every 2 (two) hours as needed for migraine. May repeat in 2 hours if headache persists or recurs.   Yes [provider]  tiotropium (SPIRIVA HANDIHALER) 18 MCG inhalation capsule Place 1 capsule (18 mcg total) into inhaler and inhale daily. 04/14/18 04/14/19 Yes Shane Crutch, MD  tiZANidine (ZANAFLEX) 4 MG tablet Take 4 mg by mouth every 6 (six) hours as needed for muscle spasms.   Yes [provider]  traMADol (ULTRAM) 50 MG tablet Take 50 mg by  mouth every 6 (six) hours as needed for moderate pain.   Yes [provider]  zolpidem (AMBIEN) 10 MG tablet Take 10 mg by mouth at bedtime as needed for sleep.    Yes [provider]    Review of Systems  Constitutional: Positive for fatigue (easily). Negative for appetite change.  HENT: Positive for congestion. Negative for postnasal drip and sore throat.   Eyes: Negative.   Respiratory: Positive for cough (dry cough) and shortness of breath (easily). Negative for chest tightness.   Cardiovascular: Positive for chest pain (at times with overexertion) and palpitations. Negative for leg swelling.  Gastrointestinal: Negative for abdominal distention and abdominal pain.  Endocrine: Negative.   Genitourinary: Negative.   Musculoskeletal: Positive for arthralgias (feet hurting). Negative for neck pain.  Skin: Negative.   Allergic/Immunologic: Negative.   Neurological: Positive for light-headedness. Negative for dizziness.  Hematological: Negative for adenopathy. Does not bruise/bleed easily.  Psychiatric/Behavioral: Positive for sleep disturbance. Negative for dysphoric mood. The patient is not nervous/anxious.    Vitals:   06/03/18 1037  BP: (!) 158/95  Pulse: (!) 125  Resp: 18  SpO2: 96%  Weight: 196 lb 4 oz (89 kg)  Height: 5\' 5"  (1.651 m)   Wt Readings from Last 3 Encounters:  06/03/18 196 lb 4 oz (89 kg)  05/25/18 199 lb 8 oz (90.5 kg)  04/17/18 203 lb (92.1 kg)    Lab Results  Component Value Date   CREATININE 0.65 03/29/2018   CREATININE 0.80 03/28/2018   CREATININE 0.80 10/16/2017    Physical Exam  Constitutional: She is oriented to person, place, and time. She appears well-developed and well-nourished.  HENT:  Head: Normocephalic and atraumatic.  Neck: Normal range of motion. Neck supple. No thyromegaly present.  Cardiovascular: Regular rhythm. Tachycardia present.  Pulmonary/Chest: Effort normal. No respiratory distress. She has no wheezes. She  has no rales.  Abdominal: Soft. She exhibits no distension.  Musculoskeletal:       Right lower leg: She exhibits no tenderness and no edema.       Left lower leg: She exhibits no tenderness and no edema.  Neurological: She is alert and oriented to person, place, and time.  Skin: Skin is warm and dry.  Psychiatric: She has a normal mood and affect. Her behavior is normal.  Nursing note and vitals reviewed.  Assessment &  Plan:  1: Chronic heart failure with preserved ejection fraction- - NYHA class III - euvolemic today - weighing daily and she was reminded to call for an overnight weight gain of >2 pounds or a weekly weight gain of >5 pounds - weight down 3 pounds from last visit here 2 weeks ago - follows with cardiology [name]) had a stress test done  - not adding salt and it was reviewed about keeping daily sodium intake to 2000mg  daily - reviewed fluid intake and keeping daily intake to 60-64 ounces. Patient says that she probably drinks twice that amount because of her dry mouth. Encouraged her to try sucking on sugar free candy instead of drinking so much fluid. - will get BMP today since she's taken additional diuretic since she was last here - tachycardic today but normally her HR runs 70-80's. Continue to monitor - BNP 03/28/18 was 11.0 - PharmD reconciled medications with the patient  2: HTN- - BP mildly elevated today; may need to titrate metoprolol succinate or add ACE-I at her next visit - sees PCP Letta Pate) 04/24/18 - BMP 03/29/18 reviewed and showed sodium 143, potassium 4.0 and GFR >60  3: DM- - fasting glucose at home yesterday was 145 - A1c 10/14/17 was 7.6% - saw endocrinology (Solum) 05/26/18  4: Obstructive sleep apnea- - saw pulmonolgist Nicholos Johns) 04/14/18 - sleep study scheduled for November  5: Lymphedema- - stage 2 - limited in exercise due to symptoms - edema has resolved today  Patient did not bring her medications nor a list. Each medication was  verbally reviewed with the patient and she was encouraged to bring the bottles to every visit to confirm accuracy of list.  Return in 1 month or sooner for any questions/problems before then.

## 2018-06-03 ENCOUNTER — Encounter: Payer: Self-pay | Admitting: Family

## 2018-06-03 ENCOUNTER — Ambulatory Visit: Payer: 59 | Attending: Family | Admitting: Family

## 2018-06-03 ENCOUNTER — Telehealth: Payer: Self-pay | Admitting: Family

## 2018-06-03 VITALS — BP 158/95 | HR 125 | Resp 18 | Ht 65.0 in | Wt 196.2 lb

## 2018-06-03 DIAGNOSIS — I110 Hypertensive heart disease with heart failure: Secondary | ICD-10-CM | POA: Insufficient documentation

## 2018-06-03 DIAGNOSIS — Z9049 Acquired absence of other specified parts of digestive tract: Secondary | ICD-10-CM | POA: Diagnosis not present

## 2018-06-03 DIAGNOSIS — K219 Gastro-esophageal reflux disease without esophagitis: Secondary | ICD-10-CM | POA: Insufficient documentation

## 2018-06-03 DIAGNOSIS — Z79899 Other long term (current) drug therapy: Secondary | ICD-10-CM | POA: Insufficient documentation

## 2018-06-03 DIAGNOSIS — E079 Disorder of thyroid, unspecified: Secondary | ICD-10-CM | POA: Diagnosis not present

## 2018-06-03 DIAGNOSIS — M1990 Unspecified osteoarthritis, unspecified site: Secondary | ICD-10-CM | POA: Insufficient documentation

## 2018-06-03 DIAGNOSIS — Z880 Allergy status to penicillin: Secondary | ICD-10-CM | POA: Insufficient documentation

## 2018-06-03 DIAGNOSIS — R0602 Shortness of breath: Secondary | ICD-10-CM | POA: Diagnosis present

## 2018-06-03 DIAGNOSIS — Z8673 Personal history of transient ischemic attack (TIA), and cerebral infarction without residual deficits: Secondary | ICD-10-CM | POA: Insufficient documentation

## 2018-06-03 DIAGNOSIS — I5032 Chronic diastolic (congestive) heart failure: Secondary | ICD-10-CM | POA: Insufficient documentation

## 2018-06-03 DIAGNOSIS — Z8249 Family history of ischemic heart disease and other diseases of the circulatory system: Secondary | ICD-10-CM | POA: Insufficient documentation

## 2018-06-03 DIAGNOSIS — E119 Type 2 diabetes mellitus without complications: Secondary | ICD-10-CM | POA: Insufficient documentation

## 2018-06-03 DIAGNOSIS — I890 Lymphedema, not elsewhere classified: Secondary | ICD-10-CM | POA: Diagnosis not present

## 2018-06-03 DIAGNOSIS — J45909 Unspecified asthma, uncomplicated: Secondary | ICD-10-CM | POA: Diagnosis not present

## 2018-06-03 DIAGNOSIS — M797 Fibromyalgia: Secondary | ICD-10-CM | POA: Insufficient documentation

## 2018-06-03 DIAGNOSIS — Z7982 Long term (current) use of aspirin: Secondary | ICD-10-CM | POA: Diagnosis not present

## 2018-06-03 DIAGNOSIS — E876 Hypokalemia: Secondary | ICD-10-CM | POA: Diagnosis not present

## 2018-06-03 DIAGNOSIS — Z96651 Presence of right artificial knee joint: Secondary | ICD-10-CM | POA: Diagnosis not present

## 2018-06-03 DIAGNOSIS — G4733 Obstructive sleep apnea (adult) (pediatric): Secondary | ICD-10-CM | POA: Diagnosis not present

## 2018-06-03 DIAGNOSIS — Z794 Long term (current) use of insulin: Secondary | ICD-10-CM | POA: Diagnosis not present

## 2018-06-03 DIAGNOSIS — I10 Essential (primary) hypertension: Secondary | ICD-10-CM

## 2018-06-03 LAB — BASIC METABOLIC PANEL
Anion gap: 14 (ref 5–15)
BUN: 22 mg/dL — ABNORMAL HIGH (ref 6–20)
CO2: 27 mmol/L (ref 22–32)
Calcium: 10.1 mg/dL (ref 8.9–10.3)
Chloride: 102 mmol/L (ref 98–111)
Creatinine, Ser: 1.18 mg/dL — ABNORMAL HIGH (ref 0.44–1.00)
GFR calc Af Amer: 60 mL/min — ABNORMAL LOW (ref >60)
GFR calc non Af Amer: 52 mL/min — ABNORMAL LOW (ref >60)
Glucose, Bld: 166 mg/dL — ABNORMAL HIGH (ref 70–99)
Potassium: 3.6 mmol/L (ref 3.5–5.1)
Sodium: 143 mmol/L (ref 135–145)

## 2018-06-03 NOTE — Patient Instructions (Signed)
Continue weighing daily and call for an overnight weight gain of > 2 pounds or a weekly weight gain of >5 pounds. 

## 2018-06-03 NOTE — Telephone Encounter (Signed)
Spoke with patient regarding lab results obtained today (06/03/18) after taking extra diuretics. Potassium is stable and renal function has declined slightly. Will plan on rechecking at her next office visit.  Patient verbalized understanding.

## 2018-06-04 ENCOUNTER — Encounter: Payer: Self-pay | Admitting: Family

## 2018-06-24 ENCOUNTER — Other Ambulatory Visit: Payer: Self-pay | Admitting: Internal Medicine

## 2018-06-24 MED ORDER — TIOTROPIUM BROMIDE MONOHYDRATE 18 MCG IN CAPS: 18.0000 ug | ORAL_CAPSULE | Freq: Every day | RESPIRATORY_TRACT | 2 refills | Status: DC

## 2018-06-30 NOTE — Progress Notes (Deleted)
Patient ID: Amanda [name], female    DOB: [birth year], 52 y.o.   MRN: [id number]  HPI  Amanda [name] is a 52 y/o female with a history of asthma, DM, HTN, stroke, thyroid disease, GERD, fibromyalgia, migraines, obstructive sleep apnea and chronic heart failure.   Echo report from 03/29/18 reviewed and showed an EF of 55-60%.  Admitted 03/28/18 due to chest pain. Cardiology consult obtained. CXR and lab work were negative. No evidence of PE with a CT. Discharged after 2 days.   She presents today for a follow-up visit with a chief complaint of    Past Medical History:  Diagnosis Date  . Arthritis    knees, shoulder, upper back  . Asthma   . CHF (congestive heart failure) (HCC)   . Diabetes mellitus without complication (HCC)   . Environmental allergies   . Fibromyalgia   . GERD (gastroesophageal reflux disease)   . Headache    migraines - 5x/mo  . Hypertension   . Hypokalemia 10/14/2017  . Migraines   . Motion sickness    ships  . Sleep apnea   . Stroke [facility])    no residual deficits  . Thyroid nodule    bilateral and goiter  . Wears contact lenses   . Wears dentures    partial upper   Past Surgical History:  Procedure Laterality Date  . ABDOMINAL HYSTERECTOMY  2000's  . ABDOMINAL SURGERY     laparoscopy x4 with lysis of adhesions  . APPENDECTOMY  1986  . CESAREAN SECTION  1992  . CHOLECYSTECTOMY N/A 04/03/2017   Procedure: LAPAROSCOPIC CHOLECYSTECTOMY;  Surgeon: Henrene Dodge, MD;  Location: [facility] ORS;  Service: General;  Laterality: N/A;  . COLONOSCOPY WITH PROPOFOL N/A 03/10/2017   Procedure: COLONOSCOPY WITH PROPOFOL;  Surgeon: Scot Jun, MD;  Location: [facility] ENDOSCOPY;  Service: Endoscopy;  Laterality: N/A;  . ECTOPIC PREGNANCY SURGERY  2000's  . ESOPHAGOGASTRODUODENOSCOPY (EGD) WITH PROPOFOL N/A 03/10/2017   Procedure: ESOPHAGOGASTRODUODENOSCOPY (EGD) WITH PROPOFOL;  Surgeon: Scot Jun, MD;  Location: [facility] ENDOSCOPY;  Service: Endoscopy;  Laterality: N/A;  .  HERNIA REPAIR    . JOINT REPLACEMENT Right 12/16/12   knee- medial - makoplasty  . KNEE ARTHROSCOPY Right 2006   partial medial and lateral meniscectomies  . KNEE ARTHROSCOPY Right 10/06/2015   Procedure: RIGHT KNEE ARTHROSCOPY WITH DEBRIDEMENT;  Surgeon: Erin Sons, MD;  Location: [facility];  Service: Orthopedics;  Laterality: Right;  Diabetic - insulin and oral meds  . ROTATOR CUFF REPAIR Left 2006  . TUBAL LIGATION  2000's   Family History  Problem Relation Age of Onset  . Hypertension Mother   . Hypertension Father   . Diabetes Father   . Allergies Father   . Breast cancer Neg Hx    Social History   Tobacco Use  . Smoking status: Never Smoker  . Smokeless tobacco: Never Used  Substance Use Topics  . Alcohol use: No   Allergies  Allergen Reactions  . Penicillins Anaphylaxis    Has patient had a PCN reaction causing immediate rash, facial/tongue/throat swelling, SOB or lightheadedness with hypotension: Yes Has patient had a PCN reaction causing severe rash involving mucus membranes or skin necrosis: No Has patient had a PCN reaction that required hospitalization No Has patient had a PCN reaction occurring within the last 10 years: No If all of the above answers are "NO", then may proceed with Cephalosporin use.   . Ace Inhibitors Swelling     Review  of Systems  Constitutional: Positive for fatigue (easily). Negative for appetite change.  HENT: Positive for congestion. Negative for postnasal drip and sore throat.   Eyes: Negative.   Respiratory: Positive for cough (dry cough) and shortness of breath (easily). Negative for chest tightness.   Cardiovascular: Positive for chest pain (at times with overexertion) and palpitations. Negative for leg swelling.  Gastrointestinal: Negative for abdominal distention and abdominal pain.  Endocrine: Negative.   Genitourinary: Negative.   Musculoskeletal: Positive for arthralgias (feet hurting). Negative for neck pain.   Skin: Negative.   Allergic/Immunologic: Negative.   Neurological: Positive for light-headedness. Negative for dizziness.  Hematological: Negative for adenopathy. Does not bruise/bleed easily.  Psychiatric/Behavioral: Positive for sleep disturbance. Negative for dysphoric mood. The patient is not nervous/anxious.      Physical Exam  Constitutional: She is oriented to person, place, and time. She appears well-developed and well-nourished.  HENT:  Head: Normocephalic and atraumatic.  Neck: Normal range of motion. Neck supple. No thyromegaly present.  Cardiovascular: Regular rhythm. Tachycardia present.  Pulmonary/Chest: Effort normal. No respiratory distress. She has no wheezes. She has no rales.  Abdominal: Soft. She exhibits no distension.  Musculoskeletal:       Right lower leg: She exhibits no tenderness and no edema.       Left lower leg: She exhibits no tenderness and no edema.  Neurological: She is alert and oriented to person, place, and time.  Skin: Skin is warm and dry.  Psychiatric: She has a normal mood and affect. Her behavior is normal.  Nursing note and vitals reviewed.  Assessment & Plan:  1: Chronic heart failure with preserved ejection fraction- - NYHA class III - euvolemic today - weighing daily and she was reminded to call for an overnight weight gain of >2 pounds or a weekly weight gain of >5 pounds - weight  - follows with cardiology [name]) had a stress test done  - not adding salt and it was reviewed about keeping daily sodium intake to 2000mg  daily - reviewed fluid intake and keeping daily intake to 60-64 ounces. Patient says that she probably drinks twice that amount because of her dry mouth. Encouraged her to try sucking on sugar free candy instead of drinking so much fluid. - will get BMP today due to CKD - tachycardic today but normally her HR runs 70-80's. Continue to monitor - BNP 03/28/18 was 11.0 - PharmD reconciled medications with the  patient  2: HTN- - BP  - sees PCP (Aycock) 04/24/18 - BMP 06/03/18 reviewed and showed sodium 143, potassium 3.6, creatinine 1.18 and GFR 60  3: DM- - fasting glucose at home yesterday was  - A1c 10/14/17 was 7.6% - saw endocrinology (Solum) 05/26/18  4: Obstructive sleep apnea- - saw pulmonolgist Nicholos Johns) 04/14/18 - sleep study scheduled for November  5: Lymphedema- - stage 2 - limited in exercise due to symptoms - edema has resolved today  Patient did not bring her medications nor a list. Each medication was verbally reviewed with the patient and she was encouraged to bring the bottles to every visit to confirm accuracy of list.

## 2018-07-01 ENCOUNTER — Telehealth: Payer: Self-pay | Admitting: Family

## 2018-07-01 ENCOUNTER — Ambulatory Visit: Payer: 59 | Admitting: Family

## 2018-07-01 NOTE — Progress Notes (Signed)
Received request for medical records from [facility] and [employer]. Faxed to [employer]. Receipt confirmed.

## 2018-07-01 NOTE — Telephone Encounter (Signed)
Patient did not show for her [facility] appointment on 07/01/18. Will attempt to reschedule.

## 2018-07-08 ENCOUNTER — Ambulatory Visit: Payer: 59 | Attending: Family | Admitting: Family

## 2018-07-08 ENCOUNTER — Other Ambulatory Visit: Payer: Self-pay | Admitting: Family

## 2018-07-08 ENCOUNTER — Ambulatory Visit
Admission: RE | Admit: 2018-07-08 | Discharge: 2018-07-08 | Disposition: A | Discharge status: Home / Self Care | Payer: 59 | Source: Ambulatory Visit | Attending: Family | Admitting: Family

## 2018-07-08 ENCOUNTER — Encounter: Payer: Self-pay | Admitting: Family

## 2018-07-08 VITALS — BP 165/94 | HR 97 | Resp 18 | Ht 65.0 in | Wt 214.0 lb

## 2018-07-08 DIAGNOSIS — G43909 Migraine, unspecified, not intractable, without status migrainosus: Secondary | ICD-10-CM | POA: Diagnosis not present

## 2018-07-08 DIAGNOSIS — Z8673 Personal history of transient ischemic attack (TIA), and cerebral infarction without residual deficits: Secondary | ICD-10-CM | POA: Diagnosis not present

## 2018-07-08 DIAGNOSIS — Z7982 Long term (current) use of aspirin: Secondary | ICD-10-CM | POA: Diagnosis not present

## 2018-07-08 DIAGNOSIS — Z794 Long term (current) use of insulin: Secondary | ICD-10-CM | POA: Insufficient documentation

## 2018-07-08 DIAGNOSIS — E119 Type 2 diabetes mellitus without complications: Secondary | ICD-10-CM | POA: Insufficient documentation

## 2018-07-08 DIAGNOSIS — Z8249 Family history of ischemic heart disease and other diseases of the circulatory system: Secondary | ICD-10-CM | POA: Insufficient documentation

## 2018-07-08 DIAGNOSIS — I5033 Acute on chronic diastolic (congestive) heart failure: Secondary | ICD-10-CM

## 2018-07-08 DIAGNOSIS — J45909 Unspecified asthma, uncomplicated: Secondary | ICD-10-CM | POA: Insufficient documentation

## 2018-07-08 DIAGNOSIS — I890 Lymphedema, not elsewhere classified: Secondary | ICD-10-CM | POA: Diagnosis not present

## 2018-07-08 DIAGNOSIS — I10 Essential (primary) hypertension: Secondary | ICD-10-CM

## 2018-07-08 DIAGNOSIS — I509 Heart failure, unspecified: Secondary | ICD-10-CM | POA: Insufficient documentation

## 2018-07-08 DIAGNOSIS — M797 Fibromyalgia: Secondary | ICD-10-CM | POA: Diagnosis not present

## 2018-07-08 DIAGNOSIS — E079 Disorder of thyroid, unspecified: Secondary | ICD-10-CM | POA: Insufficient documentation

## 2018-07-08 DIAGNOSIS — G4733 Obstructive sleep apnea (adult) (pediatric): Secondary | ICD-10-CM

## 2018-07-08 DIAGNOSIS — Z79899 Other long term (current) drug therapy: Secondary | ICD-10-CM | POA: Diagnosis not present

## 2018-07-08 DIAGNOSIS — M1990 Unspecified osteoarthritis, unspecified site: Secondary | ICD-10-CM | POA: Diagnosis not present

## 2018-07-08 DIAGNOSIS — I110 Hypertensive heart disease with heart failure: Secondary | ICD-10-CM | POA: Insufficient documentation

## 2018-07-08 DIAGNOSIS — K219 Gastro-esophageal reflux disease without esophagitis: Secondary | ICD-10-CM | POA: Insufficient documentation

## 2018-07-08 LAB — BASIC METABOLIC PANEL
Anion gap: 10 (ref 5–15)
BUN: 11 mg/dL (ref 6–20)
CO2: 28 mmol/L (ref 22–32)
Calcium: 9.6 mg/dL (ref 8.9–10.3)
Chloride: 102 mmol/L (ref 98–111)
Creatinine, Ser: 0.68 mg/dL (ref 0.44–1.00)
GFR calc Af Amer: >60 mL/min (ref >60)
GFR calc non Af Amer: >60 mL/min (ref >60)
Glucose, Bld: 177 mg/dL — ABNORMAL HIGH (ref 70–99)
Potassium: 4 mmol/L (ref 3.5–5.1)
Sodium: 140 mmol/L (ref 135–145)

## 2018-07-08 LAB — BRAIN NATRIURETIC PEPTIDE: B Natriuretic Peptide: 71 pg/mL (ref 0.0–100.0)

## 2018-07-08 MED ORDER — FUROSEMIDE 10 MG/ML IJ SOLN
INTRAMUSCULAR | Status: AC
  Filled 2018-07-08: qty 8

## 2018-07-08 MED ORDER — SODIUM CHLORIDE FLUSH 0.9 % IV SOLN
INTRAVENOUS | Status: AC
  Filled 2018-07-08: qty 20

## 2018-07-08 MED ORDER — FUROSEMIDE 10 MG/ML IJ SOLN
80.0000 mg | Freq: Once | INTRAMUSCULAR | Status: AC
  Administered 2018-07-08: 80 mg via INTRAVENOUS

## 2018-07-08 MED ORDER — POTASSIUM CHLORIDE CRYS ER 20 MEQ PO TBCR
40.0000 meq | EXTENDED_RELEASE_TABLET | Freq: Once | ORAL | Status: AC
  Administered 2018-07-08: 40 meq via ORAL

## 2018-07-08 MED ORDER — POTASSIUM CHLORIDE CRYS ER 20 MEQ PO TBCR
EXTENDED_RELEASE_TABLET | ORAL | Status: AC
  Filled 2018-07-08: qty 2

## 2018-07-08 NOTE — Patient Instructions (Signed)
Continue weighing daily and call for an overnight weight gain of > 2 pounds or a weekly weight gain of >5 pounds. 

## 2018-07-08 NOTE — Progress Notes (Signed)
Patient ID: Amanda [name], female    DOB: 05-24-1966, 52 y.o.   MRN: [id number]  HPI  Amanda [name] is a 52 y/o female with a history of asthma, DM, HTN, stroke, thyroid disease, GERD, fibromyalgia, migraines, obstructive sleep apnea and chronic heart failure.   Echo report from 03/29/18 reviewed and showed an EF of 55-60%.  Admitted 03/28/18 due to chest pain. Cardiology consult obtained. CXR and lab work were negative. No evidence of PE with a CT. Discharged after 2 days.   She presents today for a follow-up visit with a chief complaint of moderate shortness of breath upon minimal exertion. She describes this as worsening over the last several day. She has associated fatigue, cough, chest pressure, pedal edema, palpitations, abdominal distention, light-headedness, difficulty sleeping and gradual weight gain. She denies any wheezing. Says that she decreased her furosemide back to 2 daily on her own because she was feeling so well and then the fluid started building back up. She says that she didn't call but she doesn't know why she didn't call. By our scale, she's gained almost 18 pounds since her last visit here here 1 month ago.  Past Medical History:  Diagnosis Date  . Arthritis    knees, shoulder, upper back  . Asthma   . CHF (congestive heart failure) (HCC)   . Diabetes mellitus without complication (HCC)   . Environmental allergies   . Fibromyalgia   . GERD (gastroesophageal reflux disease)   . Headache    migraines - 5x/mo  . Hypertension   . Hypokalemia 10/14/2017  . Migraines   . Motion sickness    ships  . Sleep apnea   . Stroke [facility])    no residual deficits  . Thyroid nodule    bilateral and goiter  . Wears contact lenses   . Wears dentures    partial upper   Past Surgical History:  Procedure Laterality Date  . ABDOMINAL HYSTERECTOMY  2000's  . ABDOMINAL SURGERY     laparoscopy x4 with lysis of adhesions  . APPENDECTOMY  1986  . CESAREAN SECTION  1992  .  CHOLECYSTECTOMY N/A 04/03/2017   Procedure: LAPAROSCOPIC CHOLECYSTECTOMY;  Surgeon: Henrene Dodge, MD;  Location: [facility] ORS;  Service: General;  Laterality: N/A;  . COLONOSCOPY WITH PROPOFOL N/A 03/10/2017   Procedure: COLONOSCOPY WITH PROPOFOL;  Surgeon: Scot Jun, MD;  Location: [facility] ENDOSCOPY;  Service: Endoscopy;  Laterality: N/A;  . ECTOPIC PREGNANCY SURGERY  2000's  . ESOPHAGOGASTRODUODENOSCOPY (EGD) WITH PROPOFOL N/A 03/10/2017   Procedure: ESOPHAGOGASTRODUODENOSCOPY (EGD) WITH PROPOFOL;  Surgeon: Scot Jun, MD;  Location: [facility] ENDOSCOPY;  Service: Endoscopy;  Laterality: N/A;  . HERNIA REPAIR    . JOINT REPLACEMENT Right 12/16/12   knee- medial - makoplasty  . KNEE ARTHROSCOPY Right 2006   partial medial and lateral meniscectomies  . KNEE ARTHROSCOPY Right 10/06/2015   Procedure: RIGHT KNEE ARTHROSCOPY WITH DEBRIDEMENT;  Surgeon: Erin Sons, MD;  Location: [facility];  Service: Orthopedics;  Laterality: Right;  Diabetic - insulin and oral meds  . ROTATOR CUFF REPAIR Left 2006  . TUBAL LIGATION  2000's   Family History  Problem Relation Age of Onset  . Hypertension Mother   . Hypertension Father   . Diabetes Father   . Allergies Father   . Breast cancer Neg Hx    Social History   Tobacco Use  . Smoking status: Never Smoker  . Smokeless tobacco: Never Used  Substance Use Topics  . Alcohol  use: No   Allergies  Allergen Reactions  . Penicillins Anaphylaxis    Has patient had a PCN reaction causing immediate rash, facial/tongue/throat swelling, SOB or lightheadedness with hypotension: Yes Has patient had a PCN reaction causing severe rash involving mucus membranes or skin necrosis: No Has patient had a PCN reaction that required hospitalization No Has patient had a PCN reaction occurring within the last 10 years: No If all of the above answers are "NO", then may proceed with Cephalosporin use.   . Ace Inhibitors Swelling   Prior to Admission  medications   Medication Sig Start Date End Date Taking? Authorizing Provider  albuterol (PROVENTIL HFA) 108 (90 Base) MCG/ACT inhaler Inhale 2 puffs into the lungs every 4 (four) hours as needed for wheezing or shortness of breath. 11/06/17  Yes Shane Crutch, MD  ARIPiprazole (ABILIFY) 2 MG tablet Take 2 mg by mouth daily.    Yes [provider]  Ascorbic Acid (VITAMIN C) 1000 MG tablet Take 500 mg by mouth 2 (two) times daily.    Yes [provider]  aspirin EC 81 MG tablet Take 1 tablet (81 mg total) by mouth daily. Patient taking differently: Take 162 mg by mouth daily.  10/17/17  Yes Johnson, Clanford L, MD  BREO ELLIPTA 200-25 MCG/INH AEPB Inhale 1 puff into the lungs daily. 04/23/18  Yes Shane Crutch, MD  canagliflozin (INVOKANA) 300 MG TABS tablet Take 300 mg by mouth daily before breakfast.   Yes [provider]  clonazePAM (KLONOPIN) 0.5 MG tablet Take 0.5 mg by mouth 2 (two) times daily.   Yes [provider]  DULoxetine (CYMBALTA) 60 MG capsule Take 60 mg by mouth 2 (two) times daily.   Yes [provider]  flunisolide (NASALIDE) 25 MCG/ACT (0.025%) SOLN Place 2 sprays into the nose daily as needed.   Yes [provider]  furosemide (LASIX) 40 MG tablet Take 40 mg by mouth 2 (two) times daily.    Yes [provider]  gabapentin (NEURONTIN) 300 MG capsule Take 600 mg by mouth 2 (two) times daily. 03/22/18  Yes [provider]  HYDROcodone-acetaminophen (NORCO/VICODIN) 5-325 MG tablet Take 1-2 tablets by mouth at bedtime as needed for moderate pain.   Yes [provider]  insulin detemir (LEVEMIR) 100 UNIT/ML injection Inject 20 Units into the skin daily.    Yes [provider]  ipratropium-albuterol (DUONEB) 0.5-2.5 (3) MG/3ML SOLN Inhale contents of 1 vial via nebulizer 3 times daily as needed for shortness of breath/wheeze/cough. 11/21/17  Yes Shane Crutch, MD  isosorbide  mononitrate (IMDUR) 30 MG 24 hr tablet Take 1 tablet (30 mg total) by mouth daily. 10/17/17  Yes Johnson, Clanford L, MD  lidocaine (LIDODERM) 5 % Place 1 patch onto the skin daily. (remove after 12 hours and apply new patch 12 hours later) 02/24/18  Yes [provider]  LINZESS 290 MCG CAPS capsule Take 1 tablet by mouth daily before breakfast.  02/28/17  Yes [provider]  meloxicam (MOBIC) 7.5 MG tablet Take 1 tablet (7.5 mg total) by mouth daily. Patient taking differently: Take 15 mg by mouth daily.  02/12/18 02/12/19 Yes Enid Derry, PA-C  metoprolol succinate (TOPROL-XL) 100 MG 24 hr tablet Take 125 mg by mouth daily. Take with or immediately following a meal.    Yes [provider]  mirtazapine (REMERON SOL-TAB) 30 MG disintegrating tablet Take 30 mg by mouth daily as needed (Nap).  03/18/18  Yes [provider]  montelukast (  SINGULAIR) 10 MG tablet Take 1 tablet (10 mg total) by mouth daily. 04/24/18 04/24/19 Yes Shane Crutch, MD  Multiple Vitamin (MULTIVITAMIN) tablet Take 1 tablet by mouth daily.   Yes [provider]  omeprazole (PRILOSEC) 40 MG capsule Take 1 capsule (40 mg total) by mouth daily. 1 hr before supper 04/05/17  Yes Piscoya, Jose, MD  Potassium Chloride ER 20 MEQ TBCR Take 20 mEq by mouth 2 (two) times daily.  03/11/18  Yes [provider]  pravastatin (PRAVACHOL) 80 MG tablet Take 1 tablet (80 mg total) by mouth daily. Patient taking differently: Take 40 mg by mouth daily.  10/17/17 06/04/19 Yes Johnson, Clanford L, MD  sitaGLIPtin (JANUVIA) 100 MG tablet Take 100 mg by mouth daily.   Yes [provider]  sucralfate (CARAFATE) 1 g tablet Take 1 g by mouth 2 (two) times daily.    Yes [provider]  SUMAtriptan (IMITREX) 25 MG tablet Take 25 mg by mouth every 2 (two) hours as needed for migraine. May repeat in 2 hours if headache persists or recurs.   Yes [provider]  tiotropium (SPIRIVA  HANDIHALER) 18 MCG inhalation capsule Place 1 capsule (18 mcg total) into inhaler and inhale daily. 06/24/18 06/24/19 Yes Shane Crutch, MD  tiZANidine (ZANAFLEX) 4 MG tablet Take 4 mg by mouth every 6 (six) hours as needed for muscle spasms.   Yes [provider]  traMADol (ULTRAM) 50 MG tablet Take 50 mg by mouth every 6 (six) hours as needed for moderate pain.   Yes [provider]  zolpidem (AMBIEN) 10 MG tablet Take 10 mg by mouth at bedtime as needed for sleep.    Yes [provider]    Review of Systems  Constitutional: Positive for fatigue. Negative for appetite change.  HENT: Positive for congestion. Negative for postnasal drip.   Eyes: Negative.   Respiratory: Positive for cough (dry cough) and shortness of breath. Negative for chest tightness.   Cardiovascular: Positive for chest pain ("pressure"), palpitations and leg swelling.  Gastrointestinal: Positive for abdominal distention. Negative for abdominal pain.  Endocrine: Negative.   Genitourinary: Negative.   Musculoskeletal: Positive for arthralgias (feet hurting). Negative for neck pain.  Skin: Negative.   Allergic/Immunologic: Negative.   Neurological: Positive for light-headedness. Negative for dizziness.  Hematological: Negative for adenopathy. Does not bruise/bleed easily.  Psychiatric/Behavioral: Positive for sleep disturbance. Negative for dysphoric mood. The patient is not nervous/anxious.    Vitals:   07/08/18 0906  BP: (!) 165/94  Pulse: 97  Resp: 18  SpO2: 97%  Weight: 214 lb (97.1 kg)  Height: 5\' 5"  (1.651 m)   Wt Readings from Last 3 Encounters:  07/08/18 214 lb (97.1 kg)  06/03/18 196 lb 4 oz (89 kg)  05/25/18 199 lb 8 oz (90.5 kg)   Lab Results  Component Value Date   CREATININE 1.18 (H) 06/03/2018   CREATININE 0.65 03/29/2018   CREATININE 0.80 03/28/2018    Physical Exam  Constitutional: She is oriented to person, place, and time. She appears well-developed  and well-nourished.  HENT:  Head: Normocephalic and atraumatic.  Neck: Normal range of motion. Neck supple. No thyromegaly present.  Cardiovascular: Normal rate and regular rhythm.  Pulmonary/Chest: Effort normal. No respiratory distress. She has no wheezes. She has no rales.  Abdominal: Soft. She exhibits distension.  Musculoskeletal:       Right lower leg: She exhibits tenderness and edema (2+ pitting).       Left lower leg: She  exhibits tenderness and edema (2+ pitting).  Neurological: She is alert and oriented to person, place, and time.  Skin: Skin is warm and dry.  Psychiatric: She has a normal mood and affect. Her behavior is normal.  Nursing note and vitals reviewed.  Assessment & Plan:  1: Acute on Chronic heart failure with preserved ejection fraction- - NYHA class III - moderately fluid overloaded today - weighing daily and she was reminded & it was emphasized to call for an overnight weight gain of >2 pounds or a weekly weight gain of >5 pounds - weight up 17.6 pounds since she was here 1 month ago - follows with cardiology [name]) had a stress test done  - not adding salt and it was reviewed about keeping daily sodium intake to 2000mg  daily - reviewed fluid intake and keeping daily intake to 60-64 ounces. Patient says that she probably drinks twice that amount because of her dry mouth. Encouraged her to try sucking on sugar free candy instead of drinking so much fluid. - will send patient for 80mg  IV lasix/ potassium today at same day surgery - BMP/BNP to be drawn today - BNP 03/28/18 was 11.0 - PharmD reconciled medications with the patient  2: HTN- - BP elevated today - sees PCP Letta Pate) 04/24/18 - BMP 06/03/18 reviewed and showed sodium 143, potassium 3.6, creatinine 1.18 and GFR 60  3: DM- - fasting glucose at home yesterday was 184 - A1c 10/14/17 was 7.6% - saw endocrinology Rosaura Carpenter) 06/29/18  4: Obstructive sleep apnea- - saw pulmonolgist  Nicholos Johns) 04/14/18 - sleep study scheduled for 07/09/18  5: Lymphedema- - stage 2 - limited in exercise due to symptoms - unable to get compression socks on - received lymphapress compression boots yesterday and is going to start using them today  Patient did not bring her medications nor a list. Each medication was verbally reviewed with the patient and she was encouraged to bring the bottles to every visit to confirm accuracy of list.  Return tomorrow for recheck of symptoms.

## 2018-07-08 NOTE — Progress Notes (Signed)
Pt discharged from [facility] via wheelchair condition stable

## 2018-07-09 ENCOUNTER — Ambulatory Visit
Admission: RE | Admit: 2018-07-09 | Discharge: 2018-07-09 | Disposition: A | Discharge status: Home / Self Care | Payer: 59 | Source: Ambulatory Visit | Attending: Family | Admitting: Family

## 2018-07-09 ENCOUNTER — Other Ambulatory Visit: Payer: Self-pay | Admitting: Family

## 2018-07-09 ENCOUNTER — Ambulatory Visit: Payer: 59 | Admitting: Family

## 2018-07-09 ENCOUNTER — Encounter: Payer: Self-pay | Admitting: Family

## 2018-07-09 VITALS — BP 146/100 | HR 118 | Resp 18 | Ht 65.0 in | Wt 210.2 lb

## 2018-07-09 DIAGNOSIS — I5033 Acute on chronic diastolic (congestive) heart failure: Secondary | ICD-10-CM

## 2018-07-09 DIAGNOSIS — Z8673 Personal history of transient ischemic attack (TIA), and cerebral infarction without residual deficits: Secondary | ICD-10-CM

## 2018-07-09 DIAGNOSIS — Z9851 Tubal ligation status: Secondary | ICD-10-CM | POA: Insufficient documentation

## 2018-07-09 DIAGNOSIS — I110 Hypertensive heart disease with heart failure: Secondary | ICD-10-CM | POA: Insufficient documentation

## 2018-07-09 DIAGNOSIS — Z7982 Long term (current) use of aspirin: Secondary | ICD-10-CM | POA: Insufficient documentation

## 2018-07-09 DIAGNOSIS — Z79891 Long term (current) use of opiate analgesic: Secondary | ICD-10-CM | POA: Insufficient documentation

## 2018-07-09 DIAGNOSIS — Z79899 Other long term (current) drug therapy: Secondary | ICD-10-CM | POA: Insufficient documentation

## 2018-07-09 DIAGNOSIS — Z90710 Acquired absence of both cervix and uterus: Secondary | ICD-10-CM

## 2018-07-09 DIAGNOSIS — Z880 Allergy status to penicillin: Secondary | ICD-10-CM

## 2018-07-09 DIAGNOSIS — E119 Type 2 diabetes mellitus without complications: Secondary | ICD-10-CM

## 2018-07-09 DIAGNOSIS — M797 Fibromyalgia: Secondary | ICD-10-CM

## 2018-07-09 DIAGNOSIS — Z9049 Acquired absence of other specified parts of digestive tract: Secondary | ICD-10-CM

## 2018-07-09 DIAGNOSIS — Z8249 Family history of ischemic heart disease and other diseases of the circulatory system: Secondary | ICD-10-CM | POA: Insufficient documentation

## 2018-07-09 DIAGNOSIS — I10 Essential (primary) hypertension: Secondary | ICD-10-CM

## 2018-07-09 DIAGNOSIS — G4733 Obstructive sleep apnea (adult) (pediatric): Secondary | ICD-10-CM | POA: Insufficient documentation

## 2018-07-09 DIAGNOSIS — Z794 Long term (current) use of insulin: Secondary | ICD-10-CM | POA: Insufficient documentation

## 2018-07-09 DIAGNOSIS — I890 Lymphedema, not elsewhere classified: Secondary | ICD-10-CM | POA: Insufficient documentation

## 2018-07-09 LAB — BASIC METABOLIC PANEL
Anion gap: 12 (ref 5–15)
BUN: 13 mg/dL (ref 6–20)
CO2: 27 mmol/L (ref 22–32)
Calcium: 9.7 mg/dL (ref 8.9–10.3)
Chloride: 100 mmol/L (ref 98–111)
Creatinine, Ser: 0.94 mg/dL (ref 0.44–1.00)
GFR calc Af Amer: >60 mL/min (ref >60)
GFR calc non Af Amer: >60 mL/min (ref >60)
Glucose, Bld: 240 mg/dL — ABNORMAL HIGH (ref 70–99)
Potassium: 4.3 mmol/L (ref 3.5–5.1)
Sodium: 139 mmol/L (ref 135–145)

## 2018-07-09 LAB — BRAIN NATRIURETIC PEPTIDE: B Natriuretic Peptide: 12 pg/mL (ref 0.0–100.0)

## 2018-07-09 MED ORDER — SODIUM CHLORIDE FLUSH 0.9 % IV SOLN
INTRAVENOUS | Status: AC
  Filled 2018-07-09: qty 10

## 2018-07-09 MED ORDER — FUROSEMIDE 10 MG/ML IJ SOLN
80.0000 mg | Freq: Once | INTRAMUSCULAR | Status: AC
  Administered 2018-07-09: 80 mg via INTRAVENOUS

## 2018-07-09 MED ORDER — POTASSIUM CHLORIDE CRYS ER 20 MEQ PO TBCR
EXTENDED_RELEASE_TABLET | ORAL | Status: AC
  Filled 2018-07-09: qty 2

## 2018-07-09 MED ORDER — FUROSEMIDE 10 MG/ML IJ SOLN
INTRAMUSCULAR | Status: AC
  Filled 2018-07-09: qty 8

## 2018-07-09 MED ORDER — POTASSIUM CHLORIDE CRYS ER 20 MEQ PO TBCR
40.0000 meq | EXTENDED_RELEASE_TABLET | Freq: Once | ORAL | Status: AC
  Administered 2018-07-09: 40 meq via ORAL

## 2018-07-09 NOTE — Progress Notes (Signed)
Patient ID: Amanda [name], female    DOB: 05/09/1966, 52 y.o.   MRN: [id number]  HPI  [name] is a 52 y/o female with a history of asthma, DM, HTN, stroke, thyroid disease, GERD, fibromyalgia, migraines, obstructive sleep apnea and chronic heart failure.   Echo report from 03/29/18 reviewed and showed an EF of 55-60%.  Admitted 03/28/18 due to chest pain. Cardiology consult obtained. CXR and lab work were negative. No evidence of PE with a CT. Discharged after 2 days.   She presents today for a follow-up visit with a chief complaint of moderate shortness of breath upon minimal exertion. She describes this as chronic in nature having been present for several years. She feels like it's a little bit better from yesterday. She has associated fatigue, cough, chest pressure, pedal edema (although better), abdominal distention, light-headedness and difficulty sleeping along with this. She denies any weight gain from yesterday.   Past Medical History:  Diagnosis Date  . Arthritis    knees, shoulder, upper back  . Asthma   . CHF (congestive heart failure) (HCC)   . Diabetes mellitus without complication (HCC)   . Environmental allergies   . Fibromyalgia   . GERD (gastroesophageal reflux disease)   . Headache    migraines - 5x/mo  . Hypertension   . Hypokalemia 10/14/2017  . Migraines   . Motion sickness    ships  . Sleep apnea   . Stroke [facility])    no residual deficits  . Thyroid nodule    bilateral and goiter  . Wears contact lenses   . Wears dentures    partial upper   Past Surgical History:  Procedure Laterality Date  . ABDOMINAL HYSTERECTOMY  2000's  . ABDOMINAL SURGERY     laparoscopy x4 with lysis of adhesions  . APPENDECTOMY  1986  . CESAREAN SECTION  1992  . CHOLECYSTECTOMY N/A 04/03/2017   Procedure: LAPAROSCOPIC CHOLECYSTECTOMY;  Surgeon: Henrene Dodge, MD;  Location: [facility] ORS;  Service: General;  Laterality: N/A;  . COLONOSCOPY WITH PROPOFOL N/A 03/10/2017   Procedure:  COLONOSCOPY WITH PROPOFOL;  Surgeon: Scot Jun, MD;  Location: [facility] ENDOSCOPY;  Service: Endoscopy;  Laterality: N/A;  . ECTOPIC PREGNANCY SURGERY  2000's  . ESOPHAGOGASTRODUODENOSCOPY (EGD) WITH PROPOFOL N/A 03/10/2017   Procedure: ESOPHAGOGASTRODUODENOSCOPY (EGD) WITH PROPOFOL;  Surgeon: Scot Jun, MD;  Location: [facility] ENDOSCOPY;  Service: Endoscopy;  Laterality: N/A;  . HERNIA REPAIR    . JOINT REPLACEMENT Right 12/16/12   knee- medial - makoplasty  . KNEE ARTHROSCOPY Right 2006   partial medial and lateral meniscectomies  . KNEE ARTHROSCOPY Right 10/06/2015   Procedure: RIGHT KNEE ARTHROSCOPY WITH DEBRIDEMENT;  Surgeon: Erin Sons, MD;  Location: [facility];  Service: Orthopedics;  Laterality: Right;  Diabetic - insulin and oral meds  . ROTATOR CUFF REPAIR Left 2006  . TUBAL LIGATION  2000's   Family History  Problem Relation Age of Onset  . Hypertension Mother   . Hypertension Father   . Diabetes Father   . Allergies Father   . Breast cancer Neg Hx    Social History   Tobacco Use  . Smoking status: Never Smoker  . Smokeless tobacco: Never Used  Substance Use Topics  . Alcohol use: No   Allergies  Allergen Reactions  . Penicillins Anaphylaxis    Has patient had a PCN reaction causing immediate rash, facial/tongue/throat swelling, SOB or lightheadedness with hypotension: Yes Has patient had a PCN reaction causing severe  rash involving mucus membranes or skin necrosis: No Has patient had a PCN reaction that required hospitalization No Has patient had a PCN reaction occurring within the last 10 years: No If all of the above answers are "NO", then may proceed with Cephalosporin use.   . Ace Inhibitors Swelling   Prior to Admission medications   Medication Sig Start Date End Date Taking? Authorizing Provider  albuterol (PROVENTIL HFA) 108 (90 Base) MCG/ACT inhaler Inhale 2 puffs into the lungs every 4 (four) hours as needed for wheezing or shortness  of breath. 11/06/17  Yes Shane Crutch, MD  ARIPiprazole (ABILIFY) 2 MG tablet Take 2 mg by mouth daily.    Yes [provider]  Ascorbic Acid (VITAMIN C) 1000 MG tablet Take 500 mg by mouth 2 (two) times daily.    Yes [provider]  aspirin EC 81 MG tablet Take 1 tablet (81 mg total) by mouth daily. Patient taking differently: Take 162 mg by mouth daily.  10/17/17  Yes Johnson, Clanford L, MD  BREO ELLIPTA 200-25 MCG/INH AEPB Inhale 1 puff into the lungs daily. 04/23/18  Yes Shane Crutch, MD  canagliflozin (INVOKANA) 300 MG TABS tablet Take 300 mg by mouth daily before breakfast.   Yes [provider]  clonazePAM (KLONOPIN) 0.5 MG tablet Take 0.5 mg by mouth 2 (two) times daily.   Yes [provider]  DULoxetine (CYMBALTA) 60 MG capsule Take 60 mg by mouth 2 (two) times daily.   Yes [provider]  flunisolide (NASALIDE) 25 MCG/ACT (0.025%) SOLN Place 2 sprays into the nose daily as needed.   Yes [provider]  furosemide (LASIX) 40 MG tablet Take 40 mg by mouth 2 (two) times daily.    Yes [provider]  gabapentin (NEURONTIN) 300 MG capsule Take 600 mg by mouth 2 (two) times daily. 03/22/18  Yes [provider]  HYDROcodone-acetaminophen (NORCO/VICODIN) 5-325 MG tablet Take 1-2 tablets by mouth at bedtime as needed for moderate pain.   Yes [provider]  insulin detemir (LEVEMIR) 100 UNIT/ML injection Inject 20 Units into the skin daily.    Yes [provider]  ipratropium-albuterol (DUONEB) 0.5-2.5 (3) MG/3ML SOLN Inhale contents of 1 vial via nebulizer 3 times daily as needed for shortness of breath/wheeze/cough. 11/21/17  Yes Shane Crutch, MD  isosorbide mononitrate (IMDUR) 30 MG 24 hr tablet Take 1 tablet (30 mg total) by mouth daily. 10/17/17  Yes Johnson, Clanford L, MD  lidocaine (LIDODERM) 5 % Place 1 patch onto the skin daily. (remove after 12 hours and apply new patch 12  hours later) 02/24/18  Yes [provider]  LINZESS 290 MCG CAPS capsule Take 1 tablet by mouth daily before breakfast.  02/28/17  Yes [provider]  meloxicam (MOBIC) 7.5 MG tablet Take 1 tablet (7.5 mg total) by mouth daily. Patient taking differently: Take 15 mg by mouth daily.  02/12/18 02/12/19 Yes Enid Derry, PA-C  metoprolol succinate (TOPROL-XL) 100 MG 24 hr tablet Take 125 mg by mouth daily. Take with or immediately following a meal.    Yes [provider]  mirtazapine (REMERON SOL-TAB) 30 MG disintegrating tablet Take 30 mg by mouth daily as needed (Nap).  03/18/18  Yes [provider]  montelukast (SINGULAIR) 10 MG tablet Take 1 tablet (10 mg total) by mouth daily. 04/24/18 04/24/19 Yes Shane Crutch, MD  Multiple Vitamin (MULTIVITAMIN) tablet Take 1 tablet by mouth daily.   Yes [provider]  omeprazole (PRILOSEC) 40  MG capsule Take 1 capsule (40 mg total) by mouth daily. 1 hr before supper 04/05/17  Yes Piscoya, Jose, MD  Potassium Chloride ER 20 MEQ TBCR Take 20 mEq by mouth 2 (two) times daily.  03/11/18  Yes [provider]  pravastatin (PRAVACHOL) 80 MG tablet Take 1 tablet (80 mg total) by mouth daily. Patient taking differently: Take 40 mg by mouth daily.  10/17/17 06/04/19 Yes Johnson, Clanford L, MD  sitaGLIPtin (JANUVIA) 100 MG tablet Take 100 mg by mouth daily.   Yes [provider]  sucralfate (CARAFATE) 1 g tablet Take 1 g by mouth 2 (two) times daily.    Yes [provider]  SUMAtriptan (IMITREX) 25 MG tablet Take 25 mg by mouth every 2 (two) hours as needed for migraine. May repeat in 2 hours if headache persists or recurs.   Yes [provider]  tiotropium (SPIRIVA HANDIHALER) 18 MCG inhalation capsule Place 1 capsule (18 mcg total) into inhaler and inhale daily. 06/24/18 06/24/19 Yes Shane Crutch, MD  tiZANidine (ZANAFLEX) 4 MG tablet Take 4 mg by mouth every 6 (six) hours as  needed for muscle spasms.   Yes [provider]  traMADol (ULTRAM) 50 MG tablet Take 50 mg by mouth every 6 (six) hours as needed for moderate pain.   Yes [provider]  zolpidem (AMBIEN) 10 MG tablet Take 10 mg by mouth at bedtime as needed for sleep.    Yes [provider]    Review of Systems  Constitutional: Positive for fatigue (with minimal exertion). Negative for appetite change.  HENT: Positive for congestion. Negative for postnasal drip.   Eyes: Negative.   Respiratory: Positive for cough (dry cough) and shortness of breath (easily). Negative for chest tightness.   Cardiovascular: Positive for chest pain (pressure) and leg swelling (better).  Gastrointestinal: Positive for abdominal distention (better). Negative for abdominal pain.  Endocrine: Negative.   Genitourinary: Negative.   Musculoskeletal: Positive for arthralgias (feet hurting). Negative for back pain.  Skin: Negative.   Allergic/Immunologic: Negative.   Neurological: Positive for light-headedness. Negative for dizziness.  Hematological: Negative for adenopathy. Does not bruise/bleed easily.  Psychiatric/Behavioral: Positive for sleep disturbance. Negative for dysphoric mood. The patient is not nervous/anxious.    Vitals:   07/09/18 0934  BP: (!) 146/100  Pulse: (!) 118  Resp: 18  SpO2: 97%  Weight: 210 lb 4 oz (95.4 kg)  Height: 5\' 5"  (1.651 m)   Wt Readings from Last 3 Encounters:  07/09/18 210 lb 4 oz (95.4 kg)  07/08/18 214 lb (97.1 kg)  06/03/18 196 lb 4 oz (89 kg)   Lab Results  Component Value Date   CREATININE 0.68 07/08/2018   CREATININE 1.18 (H) 06/03/2018   CREATININE 0.65 03/29/2018    Physical Exam  Constitutional: She is oriented to person, place, and time. She appears well-developed and well-nourished.  HENT:  Head: Normocephalic and atraumatic.  Neck: Normal range of motion. Neck supple. No thyromegaly present.  Cardiovascular: Normal rate and regular  rhythm.  Pulmonary/Chest: Effort normal. No respiratory distress. She has no wheezes. She has no rales.  Abdominal: Soft. She exhibits distension.  Musculoskeletal:       Right lower leg: She exhibits edema (trace pitting). She exhibits no tenderness.       Left lower leg: She exhibits edema (trace pitting). She exhibits no tenderness.  Neurological: She is alert and oriented to person, place, and time.  Skin: Skin is warm and dry.  Psychiatric: She  has a normal mood and affect. Her behavior is normal.  Nursing note and vitals reviewed.  Assessment & Plan:  1: Acute on Chronic heart failure with preserved ejection fraction- - NYHA class III - continues to be moderately fluid overloaded today - weighing daily and she was reminded & it was emphasized to call for an overnight weight gain of >2 pounds or a weekly weight gain of >5 pounds - weight down 4 pounds from yesterday (still 14 pounds up from 06/03/18 visit) - follows with cardiology [name]) had a stress test done  - not adding salt and it was reviewed about keeping daily sodium intake to 2000mg  daily - reviewed fluid intake and keeping daily intake to 60-64 ounces. Patient says that she probably drinks twice that amount because of her dry mouth. Encouraged her to try sucking on sugar free candy instead of drinking so much fluid. - will send patient again for 80mg  IV lasix/ potassium today at same day surgery - BMP/BNP to be drawn today - BNP 07/08/18 was 71.0 which was higher than previous value of 11.0 - PharmD reconciled medications with the patient  2: HTN- - BP remains elevated today; she says that she's taken her medications already today - sees PCP Letta Pate) 04/24/18 - BMP 07/08/18 reviewed and showed sodium 140, potassium 4.0, creatinine 0.68 and GFR >60  3: DM- - fasting glucose at home yesterday was 184 - A1c 10/14/17 was 7.6% - saw endocrinology Amanda [name]) 06/29/18  4: Obstructive sleep apnea- - saw pulmonolgist  Amanda [name]) 04/14/18 - sleep study scheduled for 07/09/18  5: Lymphedema- - stage 2 - limited in exercise due to symptoms  - unable to get compression socks on - received lymphapress compression boots 2 days ago  Patient did not bring her medications nor a list. Each medication was verbally reviewed with the patient and she was encouraged to bring the bottles to every visit to confirm accuracy of list.  Return tomorrow for recheck of symptoms.

## 2018-07-09 NOTE — Patient Instructions (Signed)
Continue weighing daily and call for an overnight weight gain of > 2 pounds or a weekly weight gain of >5 pounds. 

## 2018-07-10 ENCOUNTER — Ambulatory Visit: Payer: 59 | Admitting: Family

## 2018-07-10 ENCOUNTER — Ambulatory Visit
Admission: RE | Admit: 2018-07-10 | Discharge: 2018-07-10 | Disposition: A | Discharge status: Home / Self Care | Payer: 59 | Source: Ambulatory Visit | Attending: Family | Admitting: Family

## 2018-07-10 ENCOUNTER — Encounter: Payer: Self-pay | Admitting: Family

## 2018-07-10 ENCOUNTER — Other Ambulatory Visit: Payer: Self-pay | Admitting: Family

## 2018-07-10 VITALS — BP 132/92 | HR 106 | Resp 18 | Ht 65.0 in | Wt 208.2 lb

## 2018-07-10 DIAGNOSIS — Z8249 Family history of ischemic heart disease and other diseases of the circulatory system: Secondary | ICD-10-CM | POA: Insufficient documentation

## 2018-07-10 DIAGNOSIS — I890 Lymphedema, not elsewhere classified: Secondary | ICD-10-CM | POA: Insufficient documentation

## 2018-07-10 DIAGNOSIS — G43909 Migraine, unspecified, not intractable, without status migrainosus: Secondary | ICD-10-CM

## 2018-07-10 DIAGNOSIS — Z79899 Other long term (current) drug therapy: Secondary | ICD-10-CM

## 2018-07-10 DIAGNOSIS — E079 Disorder of thyroid, unspecified: Secondary | ICD-10-CM | POA: Insufficient documentation

## 2018-07-10 DIAGNOSIS — I5033 Acute on chronic diastolic (congestive) heart failure: Secondary | ICD-10-CM | POA: Insufficient documentation

## 2018-07-10 DIAGNOSIS — M1990 Unspecified osteoarthritis, unspecified site: Secondary | ICD-10-CM | POA: Insufficient documentation

## 2018-07-10 DIAGNOSIS — I110 Hypertensive heart disease with heart failure: Secondary | ICD-10-CM

## 2018-07-10 DIAGNOSIS — Z7982 Long term (current) use of aspirin: Secondary | ICD-10-CM

## 2018-07-10 DIAGNOSIS — Z794 Long term (current) use of insulin: Secondary | ICD-10-CM | POA: Insufficient documentation

## 2018-07-10 DIAGNOSIS — J45909 Unspecified asthma, uncomplicated: Secondary | ICD-10-CM

## 2018-07-10 DIAGNOSIS — M797 Fibromyalgia: Secondary | ICD-10-CM | POA: Insufficient documentation

## 2018-07-10 DIAGNOSIS — G4733 Obstructive sleep apnea (adult) (pediatric): Secondary | ICD-10-CM | POA: Insufficient documentation

## 2018-07-10 DIAGNOSIS — E119 Type 2 diabetes mellitus without complications: Secondary | ICD-10-CM

## 2018-07-10 DIAGNOSIS — Z8673 Personal history of transient ischemic attack (TIA), and cerebral infarction without residual deficits: Secondary | ICD-10-CM | POA: Insufficient documentation

## 2018-07-10 DIAGNOSIS — K219 Gastro-esophageal reflux disease without esophagitis: Secondary | ICD-10-CM | POA: Insufficient documentation

## 2018-07-10 DIAGNOSIS — I10 Essential (primary) hypertension: Secondary | ICD-10-CM

## 2018-07-10 LAB — BASIC METABOLIC PANEL
Anion gap: 12 (ref 5–15)
BUN: 19 mg/dL (ref 6–20)
CO2: 28 mmol/L (ref 22–32)
Calcium: 9.8 mg/dL (ref 8.9–10.3)
Chloride: 99 mmol/L (ref 98–111)
Creatinine, Ser: 1.06 mg/dL — ABNORMAL HIGH (ref 0.44–1.00)
GFR calc Af Amer: >60 mL/min (ref >60)
GFR calc non Af Amer: 59 mL/min — ABNORMAL LOW (ref >60)
Glucose, Bld: 215 mg/dL — ABNORMAL HIGH (ref 70–99)
Potassium: 4.1 mmol/L (ref 3.5–5.1)
Sodium: 139 mmol/L (ref 135–145)

## 2018-07-10 LAB — BRAIN NATRIURETIC PEPTIDE: B Natriuretic Peptide: 7 pg/mL (ref 0.0–100.0)

## 2018-07-10 MED ORDER — POTASSIUM CHLORIDE CRYS ER 20 MEQ PO TBCR
EXTENDED_RELEASE_TABLET | ORAL | Status: AC
  Filled 2018-07-10: qty 2

## 2018-07-10 MED ORDER — SODIUM CHLORIDE FLUSH 0.9 % IV SOLN
INTRAVENOUS | Status: AC
  Filled 2018-07-10: qty 20

## 2018-07-10 MED ORDER — FUROSEMIDE 10 MG/ML IJ SOLN
80.0000 mg | Freq: Once | INTRAMUSCULAR | Status: AC
  Administered 2018-07-10: 80 mg via INTRAVENOUS

## 2018-07-10 MED ORDER — POTASSIUM CHLORIDE CRYS ER 20 MEQ PO TBCR
40.0000 meq | EXTENDED_RELEASE_TABLET | Freq: Once | ORAL | Status: AC
  Administered 2018-07-10: 40 meq via ORAL

## 2018-07-10 MED ORDER — FUROSEMIDE 10 MG/ML IJ SOLN
INTRAMUSCULAR | Status: AC
  Filled 2018-07-10: qty 8

## 2018-07-10 NOTE — Progress Notes (Signed)
Patient ID: Amanda [name], female    DOB: [birth year], 52 y.o.   MRN: [id number]  HPI  [name] is a 52 y/o female with a history of asthma, DM, HTN, stroke, thyroid disease, GERD, fibromyalgia, migraines, obstructive sleep apnea and chronic heart failure.   Echo report from 03/29/18 reviewed and showed an EF of 55-60%.  Admitted 03/28/18 due to chest pain. Cardiology consult obtained. CXR and lab work were negative. No evidence of PE with a CT. Discharged after 2 days.   She presents today for a follow-up visit with a chief complaint of moderate shortness of breath upon minimal exertion. She says that it doesn't feel like it's any better since yesterday. She has associated fatigue, chest pressure, palpitations, pedal edema, abdominal distention, light-headedness and difficulty sleeping along with this. She denies cough or weight gain.  Has received 2 doses of 80mg  IV lasix/ PO potassium this week.   Past Medical History:  Diagnosis Date  . Arthritis    knees, shoulder, upper back  . Asthma   . CHF (congestive heart failure) (HCC)   . Diabetes mellitus without complication (HCC)   . Environmental allergies   . Fibromyalgia   . GERD (gastroesophageal reflux disease)   . Headache    migraines - 5x/mo  . Hypertension   . Hypokalemia 10/14/2017  . Migraines   . Motion sickness    ships  . Sleep apnea   . Stroke [facility])    no residual deficits  . Thyroid nodule    bilateral and goiter  . Wears contact lenses   . Wears dentures    partial upper   Past Surgical History:  Procedure Laterality Date  . ABDOMINAL HYSTERECTOMY  2000's  . ABDOMINAL SURGERY     laparoscopy x4 with lysis of adhesions  . APPENDECTOMY  1986  . CESAREAN SECTION  1992  . CHOLECYSTECTOMY N/A 04/03/2017   Procedure: LAPAROSCOPIC CHOLECYSTECTOMY;  Surgeon: Henrene Dodge, MD;  Location: [facility] ORS;  Service: General;  Laterality: N/A;  . COLONOSCOPY WITH PROPOFOL N/A 03/10/2017   Procedure: COLONOSCOPY WITH  PROPOFOL;  Surgeon: Scot Jun, MD;  Location: [facility] ENDOSCOPY;  Service: Endoscopy;  Laterality: N/A;  . ECTOPIC PREGNANCY SURGERY  2000's  . ESOPHAGOGASTRODUODENOSCOPY (EGD) WITH PROPOFOL N/A 03/10/2017   Procedure: ESOPHAGOGASTRODUODENOSCOPY (EGD) WITH PROPOFOL;  Surgeon: Scot Jun, MD;  Location: [facility] ENDOSCOPY;  Service: Endoscopy;  Laterality: N/A;  . HERNIA REPAIR    . JOINT REPLACEMENT Right 12/16/12   knee- medial - makoplasty  . KNEE ARTHROSCOPY Right 2006   partial medial and lateral meniscectomies  . KNEE ARTHROSCOPY Right 10/06/2015   Procedure: RIGHT KNEE ARTHROSCOPY WITH DEBRIDEMENT;  Surgeon: Erin Sons, MD;  Location: [facility];  Service: Orthopedics;  Laterality: Right;  Diabetic - insulin and oral meds  . ROTATOR CUFF REPAIR Left 2006  . TUBAL LIGATION  2000's   Family History  Problem Relation Age of Onset  . Hypertension Mother   . Hypertension Father   . Diabetes Father   . Allergies Father   . Breast cancer Neg Hx    Social History   Tobacco Use  . Smoking status: Never Smoker  . Smokeless tobacco: Never Used  Substance Use Topics  . Alcohol use: No   Allergies  Allergen Reactions  . Penicillins Anaphylaxis    Has patient had a PCN reaction causing immediate rash, facial/tongue/throat swelling, SOB or lightheadedness with hypotension: Yes Has patient had a PCN reaction causing severe  rash involving mucus membranes or skin necrosis: No Has patient had a PCN reaction that required hospitalization No Has patient had a PCN reaction occurring within the last 10 years: No If all of the above answers are "NO", then may proceed with Cephalosporin use.   . Ace Inhibitors Swelling   Prior to Admission medications   Medication Sig Start Date End Date Taking? Authorizing Provider  albuterol (PROVENTIL HFA) 108 (90 Base) MCG/ACT inhaler Inhale 2 puffs into the lungs every 4 (four) hours as needed for wheezing or shortness of breath. 11/06/17   Yes Shane Crutch, MD  ARIPiprazole (ABILIFY) 2 MG tablet Take 2 mg by mouth daily.    Yes [provider]  Ascorbic Acid (VITAMIN C) 1000 MG tablet Take 500 mg by mouth 2 (two) times daily.    Yes [provider]  aspirin EC 81 MG tablet Take 1 tablet (81 mg total) by mouth daily. Patient taking differently: Take 162 mg by mouth daily.  10/17/17  Yes Johnson, Clanford L, MD  BREO ELLIPTA 200-25 MCG/INH AEPB Inhale 1 puff into the lungs daily. 04/23/18  Yes Shane Crutch, MD  canagliflozin (INVOKANA) 300 MG TABS tablet Take 300 mg by mouth daily before breakfast.   Yes [provider]  clonazePAM (KLONOPIN) 0.5 MG tablet Take 0.5 mg by mouth 2 (two) times daily.   Yes [provider]  DULoxetine (CYMBALTA) 60 MG capsule Take 60 mg by mouth 2 (two) times daily.   Yes [provider]  flunisolide (NASALIDE) 25 MCG/ACT (0.025%) SOLN Place 2 sprays into the nose daily as needed.   Yes [provider]  furosemide (LASIX) 40 MG tablet Take 40 mg by mouth 2 (two) times daily.    Yes [provider]  gabapentin (NEURONTIN) 300 MG capsule Take 600 mg by mouth 2 (two) times daily. 03/22/18  Yes [provider]  HYDROcodone-acetaminophen (NORCO/VICODIN) 5-325 MG tablet Take 1-2 tablets by mouth at bedtime as needed for moderate pain.   Yes [provider]  insulin detemir (LEVEMIR) 100 UNIT/ML injection Inject 20 Units into the skin daily.    Yes [provider]  ipratropium-albuterol (DUONEB) 0.5-2.5 (3) MG/3ML SOLN Inhale contents of 1 vial via nebulizer 3 times daily as needed for shortness of breath/wheeze/cough. 11/21/17  Yes Shane Crutch, MD  isosorbide mononitrate (IMDUR) 30 MG 24 hr tablet Take 1 tablet (30 mg total) by mouth daily. 10/17/17  Yes Johnson, Clanford L, MD  lidocaine (LIDODERM) 5 % Place 1 patch onto the skin daily. (remove after 12 hours and apply new patch 12 hours later) 02/24/18   Yes [provider]  LINZESS 290 MCG CAPS capsule Take 1 tablet by mouth daily before breakfast.  02/28/17  Yes [provider]  meloxicam (MOBIC) 7.5 MG tablet Take 1 tablet (7.5 mg total) by mouth daily. Patient taking differently: Take 15 mg by mouth daily.  02/12/18 02/12/19 Yes Enid Derry, PA-C  metoprolol succinate (TOPROL-XL) 100 MG 24 hr tablet Take 125 mg by mouth daily. Take with or immediately following a meal.    Yes [provider]  mirtazapine (REMERON SOL-TAB) 30 MG disintegrating tablet Take 30 mg by mouth daily as needed (Nap).  03/18/18  Yes [provider]  montelukast (SINGULAIR) 10 MG tablet Take 1 tablet (10 mg total) by mouth daily. 04/24/18 04/24/19 Yes Shane Crutch, MD  Multiple Vitamin (MULTIVITAMIN) tablet Take 1 tablet by mouth daily.   Yes [provider]  omeprazole (PRILOSEC) 40  MG capsule Take 1 capsule (40 mg total) by mouth daily. 1 hr before supper 04/05/17  Yes Piscoya, Jose, MD  Potassium Chloride ER 20 MEQ TBCR Take 20 mEq by mouth 2 (two) times daily.  03/11/18  Yes [provider]  pravastatin (PRAVACHOL) 80 MG tablet Take 1 tablet (80 mg total) by mouth daily. Patient taking differently: Take 40 mg by mouth daily.  10/17/17 06/04/19 Yes Johnson, Clanford L, MD  sitaGLIPtin (JANUVIA) 100 MG tablet Take 100 mg by mouth daily.   Yes [provider]  sucralfate (CARAFATE) 1 g tablet Take 1 g by mouth 2 (two) times daily.    Yes [provider]  SUMAtriptan (IMITREX) 25 MG tablet Take 25 mg by mouth every 2 (two) hours as needed for migraine. May repeat in 2 hours if headache persists or recurs.   Yes [provider]  tiotropium (SPIRIVA HANDIHALER) 18 MCG inhalation capsule Place 1 capsule (18 mcg total) into inhaler and inhale daily. 06/24/18 06/24/19 Yes Shane Crutch, MD  tiZANidine (ZANAFLEX) 4 MG tablet Take 4 mg by mouth every 6 (six) hours as needed for muscle  spasms.   Yes [provider]  traMADol (ULTRAM) 50 MG tablet Take 50 mg by mouth every 6 (six) hours as needed for moderate pain.   Yes [provider]  zolpidem (AMBIEN) 10 MG tablet Take 10 mg by mouth at bedtime as needed for sleep.    Yes [provider]    Review of Systems  Constitutional: Positive for fatigue (with minimal exertion). Negative for appetite change.  HENT: Negative for congestion and postnasal drip.   Eyes: Negative.   Respiratory: Positive for shortness of breath (easily). Negative for cough and chest tightness.   Cardiovascular: Positive for chest pain (pressure), palpitations and leg swelling (better).  Gastrointestinal: Positive for abdominal distention (better). Negative for abdominal pain.  Endocrine: Negative.   Genitourinary: Negative.   Musculoskeletal: Positive for arthralgias (feet hurting). Negative for back pain.  Skin: Negative.   Allergic/Immunologic: Negative.   Neurological: Positive for light-headedness. Negative for dizziness.  Hematological: Negative for adenopathy. Does not bruise/bleed easily.  Psychiatric/Behavioral: Positive for sleep disturbance. Negative for dysphoric mood. The patient is not nervous/anxious.    Vitals:   07/10/18 0829  BP: (!) 132/92  Pulse: (!) 106  Resp: 18  SpO2: 98%  Weight: 208 lb 4 oz (94.5 kg)  Height: 5\' 5"  (1.651 m)   Wt Readings from Last 3 Encounters:  07/10/18 208 lb 4 oz (94.5 kg)  07/09/18 210 lb 4 oz (95.4 kg)  07/08/18 214 lb (97.1 kg)   Lab Results  Component Value Date   CREATININE 0.94 07/09/2018   CREATININE 0.68 07/08/2018   CREATININE 1.18 (H) 06/03/2018    Physical Exam  Constitutional: She is oriented to person, place, and time. She appears well-developed and well-nourished.  HENT:  Head: Normocephalic and atraumatic.  Neck: Normal range of motion. Neck supple. No thyromegaly present.  Cardiovascular: Normal rate and regular rhythm.  Pulmonary/Chest:  Effort normal. No respiratory distress. She has no wheezes. She has no rales.  Abdominal: Soft. She exhibits distension.  Musculoskeletal:       Right lower leg: She exhibits no tenderness and no edema.       Left lower leg: She exhibits no tenderness and no edema.  Neurological: She is alert and oriented to person, place, and time.  Skin: Skin is warm and dry.  Psychiatric: She has a normal mood and affect.  Her behavior is normal.  Nursing note and vitals reviewed.  Assessment & Plan:  1: Acute on Chronic heart failure with preserved ejection fraction- - NYHA class III - fluid status improving although continues to have abdominal distention - weighing daily and she was reminded & it was emphasized to call for an overnight weight gain of >2 pounds or a weekly weight gain of >5 pounds - weight down 2 pounds from yesterday (down 6 pounds in 2 days) - palpitations worsening so cardiology office called; they can see her today - advised patient that if they felt like another dose of IV lasix would be helpful that someone from their office would need to contact us so that I could place the order - follows with cardiology [name])  - not adding salt and it was reviewed about keeping daily sodium intake to 2000mg  daily - reviewed fluid intake and keeping daily intake to 60-64 ounces. Patient says that she probably drinks twice that amount because of her dry mouth. Encouraged her to try sucking on sugar free candy instead of drinking so much fluid. - BNP 07/09/18 was 12.0   2: HTN- - BP much better (138/92) - saw PCP Letta Pate) 04/24/18 - BMP 07/09/18 reviewed and showed sodium 139, potassium 4.3, creatinine 0.94 and GFR >60  3: DM- - A1c 10/14/17 was 7.6% - saw endocrinology Rosaura Carpenter) 06/29/18  4: Obstructive sleep apnea- - saw pulmonolgist Nicholos Johns) 04/14/18 - had sleep study scheduled for 07/09/18  5: Lymphedema- - stage 2 - limited in exercise due to symptoms  - unable to get  compression socks on - has been wearing compression boots daily except for last night  Patient did not bring her medications nor a list. Each medication was verbally reviewed with the patient and she was encouraged to bring the bottles to every visit to confirm accuracy of list.  **prior to finishing this note, her cardiology's office called asking [place] to place order for additional 80mg  IV lasix/ PO potassium so this order was placed. Either [place] or their office will need to follow-up with patient next week**

## 2018-07-10 NOTE — Patient Instructions (Addendum)
Continue weighing daily and call for an overnight weight gain of > 2 pounds or a weekly weight gain of >5 pounds.  Has received 80mg  IV lasix and potassium daily for the last 2 days.

## 2018-07-17 ENCOUNTER — Emergency Department: Payer: 59

## 2018-07-17 ENCOUNTER — Other Ambulatory Visit: Payer: Self-pay

## 2018-07-17 ENCOUNTER — Encounter: Payer: Self-pay | Admitting: *Deleted

## 2018-07-17 ENCOUNTER — Inpatient Hospital Stay
Admission: EM | Admit: 2018-07-17 | Discharge: 2018-07-20 | DRG: 292 | Disposition: A | Discharge status: Home Health | Payer: 59 | Attending: Internal Medicine | Admitting: Internal Medicine

## 2018-07-17 DIAGNOSIS — Z7982 Long term (current) use of aspirin: Secondary | ICD-10-CM

## 2018-07-17 DIAGNOSIS — R0602 Shortness of breath: Secondary | ICD-10-CM | POA: Diagnosis present

## 2018-07-17 DIAGNOSIS — R0902 Hypoxemia: Secondary | ICD-10-CM | POA: Diagnosis present

## 2018-07-17 DIAGNOSIS — Z791 Long term (current) use of non-steroidal anti-inflammatories (NSAID): Secondary | ICD-10-CM

## 2018-07-17 DIAGNOSIS — R402360 Coma scale, best motor response, obeys commands, unspecified time: Secondary | ICD-10-CM | POA: Diagnosis present

## 2018-07-17 DIAGNOSIS — I5033 Acute on chronic diastolic (congestive) heart failure: Secondary | ICD-10-CM | POA: Diagnosis present

## 2018-07-17 DIAGNOSIS — R0789 Other chest pain: Secondary | ICD-10-CM | POA: Diagnosis present

## 2018-07-17 DIAGNOSIS — R402140 Coma scale, eyes open, spontaneous, unspecified time: Secondary | ICD-10-CM | POA: Diagnosis present

## 2018-07-17 DIAGNOSIS — R402250 Coma scale, best verbal response, oriented, unspecified time: Secondary | ICD-10-CM | POA: Diagnosis present

## 2018-07-17 DIAGNOSIS — G43909 Migraine, unspecified, not intractable, without status migrainosus: Secondary | ICD-10-CM | POA: Diagnosis present

## 2018-07-17 DIAGNOSIS — M19019 Primary osteoarthritis, unspecified shoulder: Secondary | ICD-10-CM | POA: Diagnosis present

## 2018-07-17 DIAGNOSIS — M479 Spondylosis, unspecified: Secondary | ICD-10-CM | POA: Diagnosis present

## 2018-07-17 DIAGNOSIS — J45909 Unspecified asthma, uncomplicated: Secondary | ICD-10-CM | POA: Diagnosis present

## 2018-07-17 DIAGNOSIS — Z888 Allergy status to other drugs, medicaments and biological substances status: Secondary | ICD-10-CM

## 2018-07-17 DIAGNOSIS — N179 Acute kidney failure, unspecified: Secondary | ICD-10-CM

## 2018-07-17 DIAGNOSIS — I160 Hypertensive urgency: Secondary | ICD-10-CM | POA: Diagnosis present

## 2018-07-17 DIAGNOSIS — I959 Hypotension, unspecified: Secondary | ICD-10-CM | POA: Diagnosis present

## 2018-07-17 DIAGNOSIS — K219 Gastro-esophageal reflux disease without esophagitis: Secondary | ICD-10-CM | POA: Diagnosis present

## 2018-07-17 DIAGNOSIS — E1140 Type 2 diabetes mellitus with diabetic neuropathy, unspecified: Secondary | ICD-10-CM | POA: Diagnosis present

## 2018-07-17 DIAGNOSIS — Z9989 Dependence on other enabling machines and devices: Secondary | ICD-10-CM

## 2018-07-17 DIAGNOSIS — T502X5A Adverse effect of carbonic-anhydrase inhibitors, benzothiadiazides and other diuretics, initial encounter: Secondary | ICD-10-CM | POA: Diagnosis present

## 2018-07-17 DIAGNOSIS — R0603 Acute respiratory distress: Secondary | ICD-10-CM | POA: Diagnosis present

## 2018-07-17 DIAGNOSIS — Z7951 Long term (current) use of inhaled steroids: Secondary | ICD-10-CM

## 2018-07-17 DIAGNOSIS — M1712 Unilateral primary osteoarthritis, left knee: Secondary | ICD-10-CM | POA: Diagnosis present

## 2018-07-17 DIAGNOSIS — Z6834 Body mass index (BMI) 34.0-34.9, adult: Secondary | ICD-10-CM

## 2018-07-17 DIAGNOSIS — Z7984 Long term (current) use of oral hypoglycemic drugs: Secondary | ICD-10-CM

## 2018-07-17 DIAGNOSIS — I110 Hypertensive heart disease with heart failure: Secondary | ICD-10-CM | POA: Diagnosis present

## 2018-07-17 DIAGNOSIS — M797 Fibromyalgia: Secondary | ICD-10-CM | POA: Diagnosis present

## 2018-07-17 DIAGNOSIS — Z880 Allergy status to penicillin: Secondary | ICD-10-CM

## 2018-07-17 DIAGNOSIS — Z8673 Personal history of transient ischemic attack (TIA), and cerebral infarction without residual deficits: Secondary | ICD-10-CM | POA: Diagnosis not present

## 2018-07-17 DIAGNOSIS — Z8639 Personal history of other endocrine, nutritional and metabolic disease: Secondary | ICD-10-CM

## 2018-07-17 DIAGNOSIS — Z96651 Presence of right artificial knee joint: Secondary | ICD-10-CM | POA: Diagnosis present

## 2018-07-17 DIAGNOSIS — G4733 Obstructive sleep apnea (adult) (pediatric): Secondary | ICD-10-CM | POA: Diagnosis present

## 2018-07-17 DIAGNOSIS — Z79899 Other long term (current) drug therapy: Secondary | ICD-10-CM

## 2018-07-17 LAB — BRAIN NATRIURETIC PEPTIDE: B Natriuretic Peptide: 39 pg/mL (ref 0.0–100.0)

## 2018-07-17 LAB — CBC
HCT: 42.6 % (ref 36.0–46.0)
Hemoglobin: 13.6 g/dL (ref 12.0–15.0)
MCH: 30 pg (ref 26.0–34.0)
MCHC: 31.9 g/dL (ref 30.0–36.0)
MCV: 93.8 fL (ref 80.0–100.0)
Platelets: 273 10*3/uL (ref 150–400)
RBC: 4.54 MIL/uL (ref 3.87–5.11)
RDW: 13.9 % (ref 11.5–15.5)
WBC: 5.3 10*3/uL (ref 4.0–10.5)
nRBC: 0 % (ref 0.0–0.2)

## 2018-07-17 LAB — BASIC METABOLIC PANEL
Anion gap: 7 (ref 5–15)
BUN: 15 mg/dL (ref 6–20)
CO2: 29 mmol/L (ref 22–32)
Calcium: 10 mg/dL (ref 8.9–10.3)
Chloride: 103 mmol/L (ref 98–111)
Creatinine, Ser: 0.97 mg/dL (ref 0.44–1.00)
GFR calc Af Amer: >60 mL/min (ref >60)
GFR calc non Af Amer: >60 mL/min (ref >60)
Glucose, Bld: 263 mg/dL — ABNORMAL HIGH (ref 70–99)
Potassium: 3.8 mmol/L (ref 3.5–5.1)
Sodium: 139 mmol/L (ref 135–145)

## 2018-07-17 LAB — GLUCOSE, CAPILLARY
Glucose-Capillary: 254 mg/dL — ABNORMAL HIGH (ref 70–99)
Glucose-Capillary: 224 mg/dL — ABNORMAL HIGH (ref 70–99)

## 2018-07-17 LAB — TROPONIN I: Troponin I: <0.03 ng/mL (ref <0.03)

## 2018-07-17 MED ORDER — METOPROLOL SUCCINATE ER 50 MG PO TB24
125.0000 mg | ORAL_TABLET | Freq: Every day | ORAL | Status: DC
  Administered 2018-07-18 – 2018-07-19 (×2): 125 mg via ORAL
  Filled 2018-07-17 (×2): qty 3

## 2018-07-17 MED ORDER — MONTELUKAST SODIUM 10 MG PO TABS
10.0000 mg | ORAL_TABLET | Freq: Every day | ORAL | Status: DC
  Administered 2018-07-17 – 2018-07-19 (×3): 10 mg via ORAL
  Filled 2018-07-17 (×3): qty 1

## 2018-07-17 MED ORDER — LINACLOTIDE 290 MCG PO CAPS
290.0000 ug | ORAL_CAPSULE | Freq: Every day | ORAL | Status: DC
  Administered 2018-07-18 – 2018-07-20 (×3): 290 ug via ORAL
  Filled 2018-07-17 (×3): qty 1

## 2018-07-17 MED ORDER — INSULIN DETEMIR 100 UNIT/ML ~~LOC~~ SOLN
12.0000 [IU] | Freq: Every day | SUBCUTANEOUS | Status: DC
  Administered 2018-07-17 – 2018-07-19 (×3): 12 [IU] via SUBCUTANEOUS
  Filled 2018-07-17 (×4): qty 0.12

## 2018-07-17 MED ORDER — ALBUTEROL SULFATE (2.5 MG/3ML) 0.083% IN NEBU
5.0000 mg | INHALATION_SOLUTION | Freq: Once | RESPIRATORY_TRACT | Status: AC
  Administered 2018-07-17: 5 mg via RESPIRATORY_TRACT
  Filled 2018-07-17: qty 6

## 2018-07-17 MED ORDER — GABAPENTIN 300 MG PO CAPS
600.0000 mg | ORAL_CAPSULE | Freq: Two times a day (BID) | ORAL | Status: DC
  Administered 2018-07-17 – 2018-07-20 (×6): 600 mg via ORAL
  Filled 2018-07-17 (×6): qty 2

## 2018-07-17 MED ORDER — VITAMIN C 500 MG PO TABS
500.0000 mg | ORAL_TABLET | Freq: Two times a day (BID) | ORAL | Status: DC
  Administered 2018-07-17 – 2018-07-20 (×6): 500 mg via ORAL
  Filled 2018-07-17 (×6): qty 1

## 2018-07-17 MED ORDER — PANTOPRAZOLE SODIUM 40 MG PO TBEC
40.0000 mg | DELAYED_RELEASE_TABLET | Freq: Every day | ORAL | Status: DC
  Administered 2018-07-17 – 2018-07-19 (×3): 40 mg via ORAL
  Filled 2018-07-17 (×3): qty 1

## 2018-07-17 MED ORDER — SENNOSIDES-DOCUSATE SODIUM 8.6-50 MG PO TABS: 1.0000 | ORAL_TABLET | Freq: Every evening | ORAL | Status: DC | PRN

## 2018-07-17 MED ORDER — ACETAMINOPHEN 650 MG RE SUPP: 650.0000 mg | Freq: Four times a day (QID) | RECTAL | Status: DC | PRN

## 2018-07-17 MED ORDER — ONDANSETRON HCL 4 MG PO TABS
4.0000 mg | ORAL_TABLET | Freq: Four times a day (QID) | ORAL | Status: DC | PRN
  Administered 2018-07-19: 4 mg via ORAL
  Filled 2018-07-17: qty 1

## 2018-07-17 MED ORDER — ISOSORBIDE MONONITRATE ER 30 MG PO TB24
30.0000 mg | ORAL_TABLET | Freq: Every day | ORAL | Status: DC
  Administered 2018-07-18: 30 mg via ORAL
  Filled 2018-07-17: qty 1

## 2018-07-17 MED ORDER — MIRTAZAPINE 15 MG PO TBDP
30.0000 mg | ORAL_TABLET | Freq: Every day | ORAL | Status: DC
  Administered 2018-07-17 – 2018-07-19 (×3): 30 mg via ORAL
  Filled 2018-07-17 (×4): qty 2

## 2018-07-17 MED ORDER — SUMATRIPTAN SUCCINATE 50 MG PO TABS
25.0000 mg | ORAL_TABLET | ORAL | Status: DC | PRN
  Filled 2018-07-17: qty 1

## 2018-07-17 MED ORDER — ZOLPIDEM TARTRATE 5 MG PO TABS: 5.0000 mg | ORAL_TABLET | Freq: Every evening | ORAL | Status: DC | PRN

## 2018-07-17 MED ORDER — TIOTROPIUM BROMIDE MONOHYDRATE 18 MCG IN CAPS
18.0000 ug | ORAL_CAPSULE | Freq: Every day | RESPIRATORY_TRACT | Status: DC
  Administered 2018-07-18 – 2018-07-20 (×3): 18 ug via RESPIRATORY_TRACT
  Filled 2018-07-17: qty 5

## 2018-07-17 MED ORDER — FLUTICASONE FUROATE-VILANTEROL 200-25 MCG/INH IN AEPB
1.0000 | INHALATION_SPRAY | Freq: Every day | RESPIRATORY_TRACT | Status: DC
  Administered 2018-07-18 – 2018-07-20 (×3): 1 via RESPIRATORY_TRACT
  Filled 2018-07-17: qty 28

## 2018-07-17 MED ORDER — HYDROCODONE-ACETAMINOPHEN 5-325 MG PO TABS
1.0000 | ORAL_TABLET | ORAL | Status: DC | PRN
  Administered 2018-07-17 – 2018-07-18 (×2): 1 via ORAL
  Filled 2018-07-17 (×2): qty 1

## 2018-07-17 MED ORDER — METOLAZONE 2.5 MG PO TABS
2.5000 mg | ORAL_TABLET | Freq: Every day | ORAL | Status: DC
  Administered 2018-07-18 – 2018-07-20 (×3): 2.5 mg via ORAL
  Filled 2018-07-17 (×3): qty 1

## 2018-07-17 MED ORDER — ALBUTEROL SULFATE (2.5 MG/3ML) 0.083% IN NEBU
2.5000 mg | INHALATION_SOLUTION | RESPIRATORY_TRACT | Status: DC | PRN
  Administered 2018-07-18: 2.5 mg via RESPIRATORY_TRACT
  Filled 2018-07-17: qty 3

## 2018-07-17 MED ORDER — ENOXAPARIN SODIUM 40 MG/0.4ML ~~LOC~~ SOLN
40.0000 mg | SUBCUTANEOUS | Status: DC
  Administered 2018-07-17 – 2018-07-19 (×3): 40 mg via SUBCUTANEOUS
  Filled 2018-07-17 (×3): qty 0.4

## 2018-07-17 MED ORDER — INSULIN ASPART 100 UNIT/ML ~~LOC~~ SOLN
0.0000 [IU] | Freq: Three times a day (TID) | SUBCUTANEOUS | Status: DC
  Administered 2018-07-18: 5 [IU] via SUBCUTANEOUS
  Administered 2018-07-18: 3 [IU] via SUBCUTANEOUS
  Administered 2018-07-18: 2 [IU] via SUBCUTANEOUS
  Administered 2018-07-19 (×2): 3 [IU] via SUBCUTANEOUS
  Administered 2018-07-19: 5 [IU] via SUBCUTANEOUS
  Administered 2018-07-20: 3 [IU] via SUBCUTANEOUS
  Filled 2018-07-17 (×7): qty 1

## 2018-07-17 MED ORDER — INSULIN ASPART 100 UNIT/ML ~~LOC~~ SOLN
0.0000 [IU] | Freq: Every day | SUBCUTANEOUS | Status: DC
  Administered 2018-07-17: 2 [IU] via SUBCUTANEOUS
  Administered 2018-07-18: 3 [IU] via SUBCUTANEOUS
  Filled 2018-07-17 (×2): qty 1

## 2018-07-17 MED ORDER — ACETAMINOPHEN 325 MG PO TABS: 650.0000 mg | ORAL_TABLET | Freq: Four times a day (QID) | ORAL | Status: DC | PRN

## 2018-07-17 MED ORDER — FUROSEMIDE 10 MG/ML IJ SOLN
80.0000 mg | Freq: Once | INTRAMUSCULAR | Status: AC
  Administered 2018-07-17: 80 mg via INTRAVENOUS
  Filled 2018-07-17: qty 8

## 2018-07-17 MED ORDER — ASPIRIN EC 81 MG PO TBEC
162.0000 mg | DELAYED_RELEASE_TABLET | Freq: Every day | ORAL | Status: DC
  Administered 2018-07-18 – 2018-07-20 (×3): 162 mg via ORAL
  Filled 2018-07-17 (×3): qty 2

## 2018-07-17 MED ORDER — SUCRALFATE 1 G PO TABS
1.0000 g | ORAL_TABLET | Freq: Two times a day (BID) | ORAL | Status: DC
  Administered 2018-07-17 – 2018-07-20 (×6): 1 g via ORAL
  Filled 2018-07-17 (×6): qty 1

## 2018-07-17 MED ORDER — PRAVASTATIN SODIUM 40 MG PO TABS
40.0000 mg | ORAL_TABLET | Freq: Every day | ORAL | Status: DC
  Administered 2018-07-17 – 2018-07-19 (×3): 40 mg via ORAL
  Filled 2018-07-17 (×3): qty 1

## 2018-07-17 MED ORDER — ARIPIPRAZOLE 2 MG PO TABS
2.0000 mg | ORAL_TABLET | Freq: Every day | ORAL | Status: DC
  Administered 2018-07-18 – 2018-07-20 (×3): 2 mg via ORAL
  Filled 2018-07-17 (×3): qty 1

## 2018-07-17 MED ORDER — ONDANSETRON HCL 4 MG/2ML IJ SOLN
4.0000 mg | Freq: Four times a day (QID) | INTRAMUSCULAR | Status: DC | PRN
  Administered 2018-07-17: 4 mg via INTRAVENOUS
  Filled 2018-07-17: qty 2

## 2018-07-17 MED ORDER — LOSARTAN POTASSIUM 50 MG PO TABS
50.0000 mg | ORAL_TABLET | Freq: Every day | ORAL | Status: DC
  Administered 2018-07-18: 50 mg via ORAL
  Filled 2018-07-17: qty 1

## 2018-07-17 MED ORDER — FUROSEMIDE 10 MG/ML IJ SOLN
60.0000 mg | Freq: Two times a day (BID) | INTRAMUSCULAR | Status: DC
  Administered 2018-07-17 – 2018-07-18 (×2): 60 mg via INTRAVENOUS
  Filled 2018-07-17 (×2): qty 6

## 2018-07-17 MED ORDER — BISACODYL 5 MG PO TBEC: 5.0000 mg | DELAYED_RELEASE_TABLET | Freq: Every day | ORAL | Status: DC | PRN

## 2018-07-17 MED ORDER — DULOXETINE HCL 30 MG PO CPEP
60.0000 mg | ORAL_CAPSULE | Freq: Two times a day (BID) | ORAL | Status: DC
  Administered 2018-07-17 – 2018-07-20 (×6): 60 mg via ORAL
  Filled 2018-07-17 (×6): qty 2

## 2018-07-17 MED ORDER — TIZANIDINE HCL 4 MG PO TABS
4.0000 mg | ORAL_TABLET | Freq: Three times a day (TID) | ORAL | Status: DC | PRN
  Administered 2018-07-17 – 2018-07-19 (×5): 4 mg via ORAL
  Filled 2018-07-17 (×8): qty 1

## 2018-07-17 MED ORDER — CLONAZEPAM 0.5 MG PO TABS
0.5000 mg | ORAL_TABLET | Freq: Two times a day (BID) | ORAL | Status: DC
  Administered 2018-07-17 – 2018-07-20 (×6): 0.5 mg via ORAL
  Filled 2018-07-17 (×6): qty 1

## 2018-07-17 MED ORDER — HYDRALAZINE HCL 20 MG/ML IJ SOLN: 10.0000 mg | Freq: Four times a day (QID) | INTRAMUSCULAR | Status: DC | PRN

## 2018-07-17 MED ORDER — ADULT MULTIVITAMIN W/MINERALS CH
1.0000 | ORAL_TABLET | Freq: Every day | ORAL | Status: DC
  Administered 2018-07-18 – 2018-07-20 (×3): 1 via ORAL
  Filled 2018-07-17 (×3): qty 1

## 2018-07-17 MED ORDER — POTASSIUM CHLORIDE CRYS ER 20 MEQ PO TBCR
20.0000 meq | EXTENDED_RELEASE_TABLET | Freq: Two times a day (BID) | ORAL | Status: DC
  Administered 2018-07-17 – 2018-07-19 (×4): 20 meq via ORAL
  Filled 2018-07-17 (×4): qty 1

## 2018-07-17 NOTE — ED Notes (Signed)
Pt ambulated and states she feels dizzy and SOB. Pt states some pain and tightness in legs and feet. MD aware

## 2018-07-17 NOTE — ED Notes (Signed)
Patient transported to X-ray 

## 2018-07-17 NOTE — Progress Notes (Signed)
Patient states she wears a cpap at night. Dr. Imogene Burnhen paged to notify. Per MD, he will put in orders for it. Patient complaining of pain and nausea. PRN Zofran and PRN Norco given. Will continue to monitor patient.

## 2018-07-17 NOTE — ED Provider Notes (Addendum)
[facility] Emergency Department Provider Note  ____________________________________________   I have reviewed the triage vital signs and the nursing notes. Where available I have reviewed prior notes and, if possible and indicated, outside hospital notes.    HISTORY  Chief Complaint Shortness of Breath    HPI [name] is a 52 y.o. female  Resents today complaining of CHF.  Patient has a known history of CHF she states that she has been getting IV Lasix but nonetheless continues to gain weight, and is having shortness of breath with exertion, orthopnea, and lower extremity edema.  She does not endorse chest pain but she states that since she is started gaining weight, over 2 weeks ago, she has had a constant chest discomfort all the time every moment which she associates with having fluid in her lungs.  She denies any pleuritic chest pain and she has no unilateral leg swelling she denies any history of PE or DVT, she is after she gets the IV Lasix she feels better than she reaccumulated fluid, she states that she has gained 18 pounds.  Patient's last echo was done in July of this year, which showed what is likely diastolic heart failure with wall thickening and LVH    Past Medical History:  Diagnosis Date  . Arthritis    knees, shoulder, upper back  . Asthma   . CHF (congestive heart failure) (HCC)   . Diabetes mellitus without complication (HCC)   . Environmental allergies   . Fibromyalgia   . GERD (gastroesophageal reflux disease)   . Headache    migraines - 5x/mo  . Hypertension   . Hypokalemia 10/14/2017  . Migraines   . Motion sickness    ships  . Sleep apnea   . Stroke [facility](HCC)    no residual deficits  . Thyroid nodule    bilateral and goiter  . Wears contact lenses   . Wears dentures    partial upper    Patient Active Problem List   Diagnosis Date Noted  . Acute on chronic heart failure (HCC) 07/08/2018  . Chronic diastolic heart  failure (HCC) 16/10/960408/16/2019  . Lymphedema 04/17/2018  . Chest pain 03/28/2018  . Hypertension 10/15/2017  . Wears dentures 10/15/2017  . Symptomatic cholelithiasis 04/02/2017  . Recurrent incisional hernia 03/17/2017  . Calculus of gallbladder without cholecystitis without obstruction 03/17/2017  . Helicobacter pylori gastritis 03/17/2017  . Depression 09/28/2016  . Diabetes mellitus, type 2 (HCC) 09/28/2016  . Tremor 07/01/2013  . Esophageal reflux 06/22/2012  . Major depressive disorder, single episode 08/22/2009  . Sleep apnea 05/30/2009  . Hyperlipidemia 08/10/2008    Past Surgical History:  Procedure Laterality Date  . ABDOMINAL HYSTERECTOMY  2000's  . ABDOMINAL SURGERY     laparoscopy x4 with lysis of adhesions  . APPENDECTOMY  1986  . CESAREAN SECTION  1992  . CHOLECYSTECTOMY N/A 04/03/2017   Procedure: LAPAROSCOPIC CHOLECYSTECTOMY;  Surgeon: Henrene DodgePiscoya, Jose, MD;  Location: [facility] ORS;  Service: General;  Laterality: N/A;  . COLONOSCOPY WITH PROPOFOL N/A 03/10/2017   Procedure: COLONOSCOPY WITH PROPOFOL;  Surgeon: Scot JunElliott, Robert T, MD;  Location: [facility] ENDOSCOPY;  Service: Endoscopy;  Laterality: N/A;  . ECTOPIC PREGNANCY SURGERY  2000's  . ESOPHAGOGASTRODUODENOSCOPY (EGD) WITH PROPOFOL N/A 03/10/2017   Procedure: ESOPHAGOGASTRODUODENOSCOPY (EGD) WITH PROPOFOL;  Surgeon: Scot JunElliott, Robert T, MD;  [facility];  Service: Endoscopy;  Laterality: N/A;  . HERNIA REPAIR    . JOINT REPLACEMENT Right 12/16/12   knee- medial -  makoplasty  . KNEE ARTHROSCOPY Right 2006   partial medial and lateral meniscectomies  . KNEE ARTHROSCOPY Right 10/06/2015   Procedure: RIGHT KNEE ARTHROSCOPY WITH DEBRIDEMENT;  Surgeon: Erin Sons, MD;  Location: [facility];  Service: Orthopedics;  Laterality: Right;  Diabetic - insulin and oral meds  . ROTATOR CUFF REPAIR Left 2006  . TUBAL LIGATION  2000's    Prior to Admission medications   Medication Sig Start Date End Date Taking?  Authorizing Provider  albuterol (PROVENTIL HFA) 108 (90 Base) MCG/ACT inhaler Inhale 2 puffs into the lungs every 4 (four) hours as needed for wheezing or shortness of breath. 11/06/17   Shane Crutch, MD  ARIPiprazole (ABILIFY) 2 MG tablet Take 2 mg by mouth daily.     [provider]  Ascorbic Acid (VITAMIN C) 1000 MG tablet Take 500 mg by mouth 2 (two) times daily.     [provider]  aspirin EC 81 MG tablet Take 1 tablet (81 mg total) by mouth daily. Patient taking differently: Take 162 mg by mouth daily.  10/17/17   Johnson, Clanford L, MD  BREO ELLIPTA 200-25 MCG/INH AEPB Inhale 1 puff into the lungs daily. 04/23/18   Shane Crutch, MD  canagliflozin (INVOKANA) 300 MG TABS tablet Take 300 mg by mouth daily before breakfast.    [provider]  clonazePAM (KLONOPIN) 0.5 MG tablet Take 0.5 mg by mouth 2 (two) times daily.    [provider]  DULoxetine (CYMBALTA) 60 MG capsule Take 60 mg by mouth 2 (two) times daily.    [provider]  flunisolide (NASALIDE) 25 MCG/ACT (0.025%) SOLN Place 2 sprays into the nose daily as needed.    [provider]  furosemide (LASIX) 40 MG tablet Take 40 mg by mouth 2 (two) times daily.     [provider]  gabapentin (NEURONTIN) 300 MG capsule Take 600 mg by mouth 2 (two) times daily. 03/22/18   [provider]  HYDROcodone-acetaminophen (NORCO/VICODIN) 5-325 MG tablet Take 1-2 tablets by mouth at bedtime as needed for moderate pain.    [provider]  insulin detemir (LEVEMIR) 100 UNIT/ML injection Inject 20 Units into the skin daily.     [provider]  ipratropium-albuterol (DUONEB) 0.5-2.5 (3) MG/3ML SOLN Inhale contents of 1 vial via nebulizer 3 times daily as needed for shortness of breath/wheeze/cough. 11/21/17   Shane Crutch, MD  isosorbide mononitrate (IMDUR) 30 MG 24 hr tablet Take 1 tablet (30 mg total) by mouth daily. 10/17/17   Johnson,  Clanford L, MD  lidocaine (LIDODERM) 5 % Place 1 patch onto the skin daily. (remove after 12 hours and apply new patch 12 hours later) 02/24/18   [provider]  LINZESS 290 MCG CAPS capsule Take 1 tablet by mouth daily before breakfast.  02/28/17   [provider]  meloxicam (MOBIC) 7.5 MG tablet Take 1 tablet (7.5 mg total) by mouth daily. Patient taking differently: Take 15 mg by mouth daily.  02/12/18 02/12/19  Enid Derry, PA-C  metoprolol succinate (TOPROL-XL) 100 MG 24 hr tablet Take 125 mg by mouth daily. Take with or immediately following a meal.     [provider]  mirtazapine (REMERON SOL-TAB) 30 MG disintegrating tablet Take 30 mg by mouth daily as needed (Nap).  03/18/18   [provider]  montelukast (SINGULAIR) 10 MG tablet Take 1 tablet (10 mg total) by mouth daily. 04/24/18 04/24/19  Shane Crutch, MD  Multiple Vitamin (MULTIVITAMIN) tablet Take 1  tablet by mouth daily.    [provider]  omeprazole (PRILOSEC) 40 MG capsule Take 1 capsule (40 mg total) by mouth daily. 1 hr before supper 04/05/17   Piscoya, Elita Quick, MD  Potassium Chloride ER 20 MEQ TBCR Take 20 mEq by mouth 2 (two) times daily.  03/11/18   [provider]  pravastatin (PRAVACHOL) 80 MG tablet Take 1 tablet (80 mg total) by mouth daily. Patient taking differently: Take 40 mg by mouth daily.  10/17/17 06/04/19  Johnson, Clanford L, MD  sitaGLIPtin (JANUVIA) 100 MG tablet Take 100 mg by mouth daily.    [provider]  sucralfate (CARAFATE) 1 g tablet Take 1 g by mouth 2 (two) times daily.     [provider]  SUMAtriptan (IMITREX) 25 MG tablet Take 25 mg by mouth every 2 (two) hours as needed for migraine. May repeat in 2 hours if headache persists or recurs.    [provider]  tiotropium (SPIRIVA HANDIHALER) 18 MCG inhalation capsule Place 1 capsule (18 mcg total) into inhaler and inhale daily. 06/24/18 06/24/19  Shane Crutch, MD   tiZANidine (ZANAFLEX) 4 MG tablet Take 4 mg by mouth every 6 (six) hours as needed for muscle spasms.    [provider]  traMADol (ULTRAM) 50 MG tablet Take 50 mg by mouth every 6 (six) hours as needed for moderate pain.    [provider]  zolpidem (AMBIEN) 10 MG tablet Take 10 mg by mouth at bedtime as needed for sleep.     [provider]    Allergies Penicillins and Ace inhibitors  Family History  Problem Relation Age of Onset  . Hypertension Mother   . Hypertension Father   . Diabetes Father   . Allergies Father   . Breast cancer Neg Hx     Social History Social History   Tobacco Use  . Smoking status: Never Smoker  . Smokeless tobacco: Never Used  Substance Use Topics  . Alcohol use: No  . Drug use: No    Review of Systems Constitutional: No fever/chills Eyes: No visual changes. ENT: No sore throat. No stiff neck no neck pain Cardiovascular: See HPI Respiratory: + shortness of breath. Gastrointestinal:   no vomiting.  No diarrhea.  No constipation. Genitourinary: Negative for dysuria. Musculoskeletal: Negative lower extremity swelling Skin: Negative for rash. Neurological: Negative for severe headaches, focal weakness or numbness.   ____________________________________________   PHYSICAL EXAM:  VITAL SIGNS: ED Triage Vitals  Enc Vitals Group     BP 07/17/18 1134 (!) 150/98     Pulse Rate 07/17/18 1134 79     Resp 07/17/18 1134 16     Temp 07/17/18 1134 (!) 97.5 F (36.4 C)     Temp Source 07/17/18 1134 Oral     SpO2 07/17/18 1134 100 %     Weight --      Height 07/17/18 1135 5\' 5"  (1.651 m)     Head Circumference --      Peak Flow --      Pain Score 07/17/18 1134 9     Pain Loc --      Pain Edu? --      Excl. in GC? --     Constitutional: Alert and oriented. Well appearing and in no acute distress. Eyes: Conjunctivae are normal Head: Atraumatic HEENT: No congestion/rhinnorhea. Mucous membranes are moist.   Oropharynx non-erythematous Neck:   Nontender with no meningismus, no masses, no stridor Cardiovascular: Normal rate, regular rhythm.  Grossly normal heart sounds.  Good peripheral circulation. Respiratory: Normal respiratory effort.  No retractions. Lungs somewhat diminished in the bases no significant rales or rhonchi Abdominal: Soft and nontender. No distention. No guarding no rebound Back:  There is no focal tenderness or step off.  there is no midline tenderness there are no lesions noted. there is no CVA tenderness  Musculoskeletal: No lower extremity tenderness, no upper extremity tenderness. No joint effusions, no DVT signs strong distal pulses 1-2+ bilateral lower extremity pitting symmetric edema Neurologic:  Normal speech and language. No gross focal neurologic deficits are appreciated.  Skin:  Skin is warm, dry and intact. No rash noted. Psychiatric: Mood and affect are normal. Speech and behavior are normal.  ____________________________________________   LABS (all labs ordered are listed, but only abnormal results are displayed)  Labs Reviewed  CBC  BASIC METABOLIC PANEL  TROPONIN I  BRAIN NATRIURETIC PEPTIDE    Pertinent labs  results that were available during my care of the patient were reviewed by me and considered in my medical decision making (see chart for details). ____________________________________________  EKG  I personally interpreted any EKGs ordered by me or triage Sinus rhythm rate 95 bpm, LVH noted.  No acute ST elevation or depression nonspecific ST changes.  LAD noted ____________________________________________  RADIOLOGY  Pertinent labs & imaging results that were available during my care of the patient were reviewed by me and considered in my medical decision making (see chart for details). If possible, patient and/or family made aware of any abnormal findings.  Dg Chest 2 View  Result Date: 07/17/2018 CLINICAL DATA:  Shortness of breath with  CHF. EXAM: CHEST - 2 VIEW COMPARISON:  Chest radiographs 03/28/2018 and CT 03/30/2018 FINDINGS: The cardiac silhouette remains mildly enlarged. The lungs remain hypoinflated without evidence of airspace consolidation, edema, pleural effusion, pneumothorax. No acute osseous abnormality is seen. Right upper quadrant abdominal surgical clips are noted. IMPRESSION: Low lung volumes without evidence of active cardiopulmonary disease. Electronically Signed   By: Sebastian Ache M.D.   On: 07/17/2018 12:03   ____________________________________________    PROCEDURES  Procedure(s) performed: None  Procedures  Critical Care performed: None  ____________________________________________   INITIAL IMPRESSION / ASSESSMENT AND PLAN / ED COURSE  Pertinent labs & imaging results that were available during my care of the patient were reviewed by me and considered in my medical decision making (see chart for details).  Patient here with outpatient instructions to be admitted for CHF, chest x-ray is reassuring she does have some bilateral lower extremity edema she does report an 18 pound weight gain as an outpatient despite her current Lasix administration.  Patient is therefore been sent in for IV diuresis.  She does not appear to be in extremitas.  EKG does not show acute ST elevation myocardial infarction, x-ray is reassuring, but her failure to 60 despite all possible outpatient interventions I think does suggest the need for admission as her cardiologist as suggested.  Patient has no evidence of ischemia despite 2 weeks of a discomforting feeling in her chest associated with fluid buildup she states.  She does have fibromyalgia and has other chronic pains none of which seem to be relevant today.  Nonetheless I did do a cardiac work-up with a negative troponin and I am quite reassured.  I do not think this weight gain and orthopnea represents a PE or a  dissection.  ----------------------------------------- 12:45 PM on 07/17/2018 -----------------------------------------  Despite some mild swelling to the lower extremities,  patient has no other stigmata of heart failure at this time, chest x-ray is clear sats are 100%, she is somewhat anxious and her blood pressure reflects this although her initial pressure was reassuring.  I do not think she has a PE, patient has had 3 negative pulmonary embolus CT scans of her chest for similar symptoms in the last year and a half and they have all been negative as well as multiple negative Dopplers of her lower extremity.  I do not think repeating this over and over for the similar symptoms is going to be of any utility the patient it is a lot of radiation.  I have talked with Dr. Park Breed, her cardiologist, who feels that if we give her IV Lasix here and she feels somewhat better we may be able to get her home.  Patient does have some history of anxiety and fibromyalgia which certainly could be confounding things but objectively speaking were not seeing a lot of findings necessitating admission and is my hope that perhaps we can prevail upon her to spend the weekend at home follow-up with her cardiologist as he would prefer as well  ----------------------------------------- 3:34 PM on 07/17/2018 -----------------------------------------  Patient's heart rate is 80 at this time, her heart rate did go up briefly when her family was in the room but as soon as they left her heart rate came back to normal.  Patient states that she felt she was feeling some stress about conversation with her family.  Patient has no significant evidence of decompensated heart failure.  Sats while I am in the room are 98%.  We will see if we can ambulate the patient there was one reading of 93 during the time the blood pressure cuff was going on.  In any event, we see no evidence of acute pathology today that would require admission I did talk  to the patient, about admission versus discharge, she states she is her strong preference to go home if possible.  We will ambulate her to make sure she does not desaturate.  ----------------------------------------- 3:45 PM on 07/17/2018 -----------------------------------------  Patient ambulated heart rate went up to 130, is is possibly because she is been over diuresed actually but in any event, she does not feel safe walking around we will admit her.   ____________________________________________   FINAL CLINICAL IMPRESSION(S) / ED DIAGNOSES  Final diagnoses:  None      This chart was dictated using voice recognition software.  Despite best efforts to proofread,  errors can occur which can change meaning.      Jeanmarie Plant, MD 07/17/18 1226    Jeanmarie Plant, MD 07/17/18 1246    Jeanmarie Plant, MD 07/17/18 1537    Jeanmarie Plant, MD 07/17/18 [phone]

## 2018-07-17 NOTE — ED Notes (Signed)
FIRST NURSE NOTE:  Pt sent here by cardiologist, states she was supposed to come here yesterday, pt states she was sent to the ED to be admitted for observation for her HF for 2 days and to receive IV lasix.

## 2018-07-17 NOTE — H&P (Addendum)
[facility] - [place] at [facility]   PATIENT NAME: Amanda [name]    MR#:  [id number]  DATE OF BIRTH:  10/19/1965  DATE OF ADMISSION:  07/17/2018  PRIMARY CARE PHYSICIAN: Emogene Morgan, MD   REQUESTING/REFERRING PHYSICIAN: Dr. Alphonzo Lemmings.  CHIEF COMPLAINT:   Chief Complaint  Patient presents with  . Shortness of Breath   Worsening shortness of breath for 3 weeks. HISTORY OF PRESENT ILLNESS:  Amanda [name]  is a 52 y.o. female with a known history of multiple medical problems as below.  The patient has had worsening shortness of breath and gaining weight 18 pound for the past 3 weeks.  She has been treated with Lasix p.o. and get IV in the heart failure center without improvement.  She also complains of chest discomfort, leg swelling, orthopnea and nocturnal dyspnea.  She is sent from heart failure scented to ED for further evaluation.  Dr. Alphonzo Lemmings requested admission for acute on chronic CHF.  PAST MEDICAL HISTORY:   Past Medical History:  Diagnosis Date  . Arthritis    knees, shoulder, upper back  . Asthma   . CHF (congestive heart failure) (HCC)   . Diabetes mellitus without complication (HCC)   . Environmental allergies   . Fibromyalgia   . GERD (gastroesophageal reflux disease)   . Headache    migraines - 5x/mo  . Hypertension   . Hypokalemia 10/14/2017  . Migraines   . Motion sickness    ships  . Sleep apnea   . Stroke [facility])    no residual deficits  . Thyroid nodule    bilateral and goiter  . Wears contact lenses   . Wears dentures    partial upper    PAST SURGICAL HISTORY:   Past Surgical History:  Procedure Laterality Date  . ABDOMINAL HYSTERECTOMY  2000's  . ABDOMINAL SURGERY     laparoscopy x4 with lysis of adhesions  . APPENDECTOMY  1986  . CESAREAN SECTION  1992  . CHOLECYSTECTOMY N/A 04/03/2017   Procedure: LAPAROSCOPIC CHOLECYSTECTOMY;  Surgeon: Henrene Dodge, MD;  Location: [facility] ORS;  Service: General;  Laterality: N/A;  .  COLONOSCOPY WITH PROPOFOL N/A 03/10/2017   Procedure: COLONOSCOPY WITH PROPOFOL;  Surgeon: Scot Jun, MD;  Location: [facility] ENDOSCOPY;  Service: Endoscopy;  Laterality: N/A;  . ECTOPIC PREGNANCY SURGERY  2000's  . ESOPHAGOGASTRODUODENOSCOPY (EGD) WITH PROPOFOL N/A 03/10/2017   Procedure: ESOPHAGOGASTRODUODENOSCOPY (EGD) WITH PROPOFOL;  Surgeon: Scot Jun, MD;  Location: [facility] ENDOSCOPY;  Service: Endoscopy;  Laterality: N/A;  . HERNIA REPAIR    . JOINT REPLACEMENT Right 12/16/12   knee- medial - makoplasty  . KNEE ARTHROSCOPY Right 2006   partial medial and lateral meniscectomies  . KNEE ARTHROSCOPY Right 10/06/2015   Procedure: RIGHT KNEE ARTHROSCOPY WITH DEBRIDEMENT;  Surgeon: Erin Sons, MD;  Location: [facility];  Service: Orthopedics;  Laterality: Right;  Diabetic - insulin and oral meds  . ROTATOR CUFF REPAIR Left 2006  . TUBAL LIGATION  2000's    SOCIAL HISTORY:   Social History   Tobacco Use  . Smoking status: Never Smoker  . Smokeless tobacco: Never Used  Substance Use Topics  . Alcohol use: No    FAMILY HISTORY:   Family History  Problem Relation Age of Onset  . Hypertension Mother   . Hypertension Father   . Diabetes Father   . Allergies Father   . Breast cancer Neg Hx     DRUG ALLERGIES:   Allergies  Allergen Reactions  . Penicillins Anaphylaxis    Has patient had a PCN reaction causing immediate rash, facial/tongue/throat swelling, SOB or lightheadedness with hypotension: Yes Has patient had a PCN reaction causing severe rash involving mucus membranes or skin necrosis: No Has patient had a PCN reaction that required hospitalization No Has patient had a PCN reaction occurring within the last 10 years: No If all of the above answers are "NO", then may proceed with Cephalosporin use.   . Ace Inhibitors Swelling    REVIEW OF SYSTEMS:   Review of Systems  Constitutional: Negative for chills, fever and malaise/fatigue.  HENT: Negative  for sore throat.   Eyes: Negative for blurred vision and double vision.  Respiratory: Positive for shortness of breath. Negative for cough, hemoptysis, sputum production, wheezing and stridor.   Cardiovascular: Positive for orthopnea and leg swelling. Negative for chest pain and palpitations.       Weight again.  Gastrointestinal: Negative for abdominal pain, blood in stool, diarrhea, melena, nausea and vomiting.  Genitourinary: Negative for dysuria, flank pain and hematuria.  Musculoskeletal: Negative for back pain and joint pain.  Neurological: Negative for dizziness, sensory change, focal weakness, seizures, loss of consciousness, weakness and headaches.  Endo/Heme/Allergies: Negative for polydipsia.  Psychiatric/Behavioral: Negative for depression. The patient is not nervous/anxious.     MEDICATIONS AT HOME:   Prior to Admission medications   Medication Sig Start Date End Date Taking? Authorizing Provider  albuterol (PROVENTIL HFA) 108 (90 Base) MCG/ACT inhaler Inhale 2 puffs into the lungs every 4 (four) hours as needed for wheezing or shortness of breath. 11/06/17  Yes Shane Crutch, MD  ARIPiprazole (ABILIFY) 2 MG tablet Take 2 mg by mouth daily.    Yes [provider]  Ascorbic Acid (VITAMIN C) 1000 MG tablet Take 500 mg by mouth 2 (two) times daily.    Yes [provider]  aspirin EC 81 MG tablet Take 1 tablet (81 mg total) by mouth daily. Patient taking differently: Take 162 mg by mouth daily.  10/17/17  Yes Johnson, Clanford L, MD  BREO ELLIPTA 200-25 MCG/INH AEPB Inhale 1 puff into the lungs daily. Patient taking differently: Inhale 1 puff into the lungs daily.  04/23/18  Yes Shane Crutch, MD  canagliflozin (INVOKANA) 300 MG TABS tablet Take 300 mg by mouth daily before breakfast.   Yes [provider]  clonazePAM (KLONOPIN) 0.5 MG tablet Take 0.5 mg by mouth 2 (two) times daily.   Yes [provider]  DULoxetine (CYMBALTA) 60 MG  capsule Take 60 mg by mouth 2 (two) times daily.   Yes [provider]  flunisolide (NASALIDE) 25 MCG/ACT (0.025%) SOLN Place 2 sprays into the nose daily as needed.   Yes [provider]  furosemide (LASIX) 40 MG tablet Take 40 mg by mouth 2 (two) times daily.    Yes [provider]  gabapentin (NEURONTIN) 300 MG capsule Take 600 mg by mouth 2 (two) times daily. 03/22/18  Yes [provider]  HYDROcodone-acetaminophen (NORCO/VICODIN) 5-325 MG tablet Take 1-2 tablets by mouth at bedtime as needed for moderate pain.   Yes [provider]  insulin detemir (LEVEMIR) 100 UNIT/ML injection Inject 12-20 Units into the skin at bedtime.    Yes [provider]  ipratropium-albuterol (DUONEB) 0.5-2.5 (3) MG/3ML SOLN Inhale contents of 1 vial via nebulizer 3 times daily as needed for shortness of breath/wheeze/cough. Patient taking differently: Inhale 3 mLs into the lungs 3 (three) times daily as needed (shortness of  breath, wheezing or cough).  11/21/17  Yes Shane Crutch, MD  isosorbide mononitrate (IMDUR) 30 MG 24 hr tablet Take 1 tablet (30 mg total) by mouth daily. 10/17/17  Yes Johnson, Clanford L, MD  lidocaine (LIDODERM) 5 % Place 1 patch onto the skin daily. (remove after 12 hours and apply new patch 12 hours later) 02/24/18  Yes [provider]  LINZESS 290 MCG CAPS capsule Take 1 tablet by mouth daily before breakfast.  02/28/17  Yes [provider]  losartan (COZAAR) 50 MG tablet Take 50 mg by mouth daily. 07/15/18  Yes [provider]  meloxicam (MOBIC) 7.5 MG tablet Take 1 tablet (7.5 mg total) by mouth daily. Patient taking differently: Take 15 mg by mouth daily.  02/12/18 02/12/19 Yes Enid Derry, PA-C  metFORMIN (GLUCOPHAGE) 1000 MG tablet Take 1,000 mg by mouth 2 (two) times daily with a meal.   Yes [provider]  metolazone (ZAROXOLYN) 2.5 MG tablet Take 2.5 mg by mouth daily. 07/15/18 07/18/18 Yes  [provider]  metoprolol succinate (TOPROL-XL) 50 MG 24 hr tablet Take 125 mg by mouth daily. Take with or immediately following a meal.    Yes [provider]  mirtazapine (REMERON SOL-TAB) 30 MG disintegrating tablet Take 30 mg by mouth daily.  03/18/18  Yes [provider]  montelukast (SINGULAIR) 10 MG tablet Take 1 tablet (10 mg total) by mouth daily. 04/24/18 04/24/19 Yes Shane Crutch, MD  Multiple Vitamin (MULTIVITAMIN) tablet Take 1 tablet by mouth daily.   Yes [provider]  omeprazole (PRILOSEC) 40 MG capsule Take 1 capsule (40 mg total) by mouth daily. 1 hr before supper 04/05/17  Yes Piscoya, Jose, MD  Potassium Chloride ER 20 MEQ TBCR Take 20 mEq by mouth 2 (two) times daily.  03/11/18  Yes [provider]  pravastatin (PRAVACHOL) 80 MG tablet Take 1 tablet (80 mg total) by mouth daily. Patient taking differently: Take 40 mg by mouth daily.  10/17/17 06/04/19 Yes Johnson, Clanford L, MD  sitaGLIPtin (JANUVIA) 100 MG tablet Take 100 mg by mouth daily.   Yes [provider]  sucralfate (CARAFATE) 1 g tablet Take 1 g by mouth 2 (two) times daily.    Yes [provider]  SUMAtriptan (IMITREX) 25 MG tablet Take 25 mg by mouth every 2 (two) hours as needed for migraine. May repeat in 2 hours if headache persists or recurs.   Yes [provider]  tiotropium (SPIRIVA HANDIHALER) 18 MCG inhalation capsule Place 1 capsule (18 mcg total) into inhaler and inhale daily. 06/24/18 06/24/19 Yes Shane Crutch, MD  tiZANidine (ZANAFLEX) 4 MG tablet Take 4 mg by mouth every 8 (eight) hours as needed for muscle spasms.    Yes [provider]  traMADol (ULTRAM) 50 MG tablet Take 50 mg by mouth every 6 (six) hours as needed for moderate pain.   Yes [provider]  zolpidem (AMBIEN) 10 MG tablet Take 10 mg by mouth at bedtime as needed for sleep.    Yes [provider]      VITAL SIGNS:  Blood  pressure (!) 168/131, pulse 91, temperature (!) 97.5 F (36.4 C), temperature source Oral, resp. rate 20, height 5\' 5"  (1.651 m), SpO2 98 %.  PHYSICAL EXAMINATION:  Physical Exam  GENERAL:  52 y.o.-year-old patient lying in the bed with no acute distress.  Morbid obesity. EYES: Pupils equal, round, reactive to light and accommodation. No scleral icterus. Extraocular muscles intact.  HEENT: Head atraumatic,  normocephalic. Oropharynx and nasopharynx clear.  NECK:  Supple, no jugular venous distention. No thyroid enlargement, no tenderness.  LUNGS: Normal breath sounds bilaterally, no wheezing, but has basilar rales, no rhonchi or crepitation. No use of accessory muscles of respiration.  CARDIOVASCULAR: S1, S2 normal. No murmurs, rubs, or gallops.  ABDOMEN: Soft, nontender, nondistended. Bowel sounds present. No organomegaly or mass.  EXTREMITIES: No pedal edema, cyanosis, or clubbing.  Positive leg edema. NEUROLOGIC: Cranial nerves II through XII are intact. Muscle strength 5/5 in all extremities. Sensation intact. Gait not checked.  PSYCHIATRIC: The patient is alert and oriented x 3.  SKIN: No obvious rash, lesion, or ulcer.   LABORATORY PANEL:   CBC Recent Labs  Lab 07/17/18 1147  WBC 5.3  HGB 13.6  HCT 42.6  PLT 273   ------------------------------------------------------------------------------------------------------------------  Chemistries  Recent Labs  Lab 07/17/18 1147  NA 139  K 3.8  CL 103  CO2 29  GLUCOSE 263*  BUN 15  CREATININE 0.97  CALCIUM 10.0   ------------------------------------------------------------------------------------------------------------------  Cardiac Enzymes Recent Labs  Lab 07/17/18 1147  TROPONINI <0.03   ------------------------------------------------------------------------------------------------------------------  RADIOLOGY:  Dg Chest 2 View  Result Date: 07/17/2018 CLINICAL DATA:  Shortness of breath with CHF. EXAM:  CHEST - 2 VIEW COMPARISON:  Chest radiographs 03/28/2018 and CT 03/30/2018 FINDINGS: The cardiac silhouette remains mildly enlarged. The lungs remain hypoinflated without evidence of airspace consolidation, edema, pleural effusion, pneumothorax. No acute osseous abnormality is seen. Right upper quadrant abdominal surgical clips are noted. IMPRESSION: Low lung volumes without evidence of active cardiopulmonary disease. Electronically Signed   By: Sebastian AcheAllen  Grady M.D.   On: 07/17/2018 12:03      IMPRESSION AND PLAN:   Acute on chronic diastolic CHF. The patient will be admitted to telemetry floor. Continue Lasix 60 mg IV every 12 hours, continue metolazone, fluid restriction, neurology consult.  Hypertension urgency.  Continue home hypertension medication, IV hydralazine PRN.  Diabetes.  Start sliding scale continue home Levemir. Diabetes neuropathy.  Continue gabapentin. OSA and morbid obesity.  CPAP at night.  Exercise and diet control.  Follow-up PCP. All the records are reviewed and case discussed with ED provider. Management plans discussed with the patient, family and they are in agreement.  CODE STATUS: Full code.  TOTAL TIME TAKING CARE OF THIS PATIENT: 38 minutes.    Shaune PollackQing Elaynah Virginia M.D on 07/17/2018 at 4:35 PM  Between 7am to 6pm - Pager - [phone]  After 6pm go to [email or web address] - Scientist, research (life sciences)password [email or web address]  [facility] [place] [facility]  Office  [phone]  CC: Primary care physician; Emogene MorganAycock, Ngwe A, MD   Note: This dictation was prepared with Dragon dictation along with smaller phrase technology. Any transcriptional errors that result from this process are unin

## 2018-07-17 NOTE — Plan of Care (Signed)

## 2018-07-17 NOTE — ED Notes (Signed)
Pt given meal tray and drink ok per MD Alphonzo LemmingsMcShane

## 2018-07-17 NOTE — ED Triage Notes (Signed)
Pt to ED after Cardiologist sent pt recommending IV lasix after pt gained 18lbs with a hx of CHF. PT reports they have attempted IV lasix 3 times since Wednesday without success. Pt continues to have SOB with exertion and lower extremity edema.

## 2018-07-18 LAB — BASIC METABOLIC PANEL
Anion gap: 12 (ref 5–15)
BUN: 28 mg/dL — ABNORMAL HIGH (ref 6–20)
CO2: 29 mmol/L (ref 22–32)
Calcium: 9.1 mg/dL (ref 8.9–10.3)
Chloride: 94 mmol/L — ABNORMAL LOW (ref 98–111)
Creatinine, Ser: 1.86 mg/dL — ABNORMAL HIGH (ref 0.44–1.00)
GFR calc Af Amer: 35 mL/min — ABNORMAL LOW (ref >60)
GFR calc non Af Amer: 30 mL/min — ABNORMAL LOW (ref >60)
Glucose, Bld: 396 mg/dL — ABNORMAL HIGH (ref 70–99)
Potassium: 4.1 mmol/L (ref 3.5–5.1)
Sodium: 135 mmol/L (ref 135–145)

## 2018-07-18 LAB — CBC
HCT: 39.7 % (ref 36.0–46.0)
Hemoglobin: 12.9 g/dL (ref 12.0–15.0)
MCH: 30.7 pg (ref 26.0–34.0)
MCHC: 32.5 g/dL (ref 30.0–36.0)
MCV: 94.5 fL (ref 80.0–100.0)
Platelets: 242 10*3/uL (ref 150–400)
RBC: 4.2 MIL/uL (ref 3.87–5.11)
RDW: 14.2 % (ref 11.5–15.5)
WBC: 5.4 10*3/uL (ref 4.0–10.5)
nRBC: 0 % (ref 0.0–0.2)

## 2018-07-18 LAB — GLUCOSE, CAPILLARY
Glucose-Capillary: 293 mg/dL — ABNORMAL HIGH (ref 70–99)
Glucose-Capillary: 202 mg/dL — ABNORMAL HIGH (ref 70–99)
Glucose-Capillary: 180 mg/dL — ABNORMAL HIGH (ref 70–99)
Glucose-Capillary: 271 mg/dL — ABNORMAL HIGH (ref 70–99)

## 2018-07-18 MED ORDER — LOSARTAN POTASSIUM 25 MG PO TABS
25.0000 mg | ORAL_TABLET | Freq: Every day | ORAL | Status: DC
  Administered 2018-07-19: 25 mg via ORAL
  Filled 2018-07-18: qty 1

## 2018-07-18 NOTE — Progress Notes (Signed)
[facility]call [facility] p[facility] about low blood pressure and she ordered to hold lasix order.

## 2018-07-18 NOTE — Progress Notes (Signed)
Advanced care plan.  Purpose of the Encounter: CODE STATUS  Parties in Attendance: Patient  Patient's Decision Capacity: Good  Subjective/Patient's story: Presented to emergency room for shortness of breath   Objective/Medical story Has congestive heart failure exacerbation Needs IV Lasix for diuresis and cardiology evaluation   Goals of care determination:  Advance care directives and goals of care discussed Patient wants everything done which includes CPR, intubation and ventilator if the need arises   CODE STATUS: Full code   Time spent discussing advanced care planning: 16 minutes

## 2018-07-18 NOTE — Progress Notes (Signed)
Patient compliant with CPAP and after taking it off c/o SOB. 02 sat was 98% on R/A. Patient was placed on 1L/[place] for comfort and she verbalized relief from the SOB. Will continue to monitor.

## 2018-07-18 NOTE — Progress Notes (Signed)
Pt said she was having a time making sense of her situation.  She said she was scared and also grieved about loneliness   07/18/18 1900  Clinical Encounter Type  Visited With Patient  Visit Type Follow-up  Referral From Nurse  Spiritual Encounters  Spiritual Needs Prayer;Emotional;Other (Comment)  Advance Directives (For Healthcare)  Does Patient Have a Medical Advance Directive? No  Mental Health Advance Directives  Does Patient Have a Mental Health Advance Directive? No  Would patient like information on creating a mental health advance directive? No - Patient declined

## 2018-07-18 NOTE — Clinical Social Work Note (Signed)
CSW received consult for problems with medication. The RNCM has a current consult in place. CSW is signing off. Please consult should social work needs arise.  Argentina PonderKaren Martha Amily Depp, MSW, Theresia MajorsLCSWA [phone]

## 2018-07-18 NOTE — Progress Notes (Addendum)
MD Pyreddy paged for low blood pressure and holding patients Lasix order. No new orders at this time.

## 2018-07-18 NOTE — Progress Notes (Signed)
Patient Blood pressure (!) 86/53, pulse 72, temperature 98.5 F (36.9 C), temperature source Oral, resp. rate 18, height 5\' 5"  (1.651 m), weight 92.6 kg, SpO2 98 %. MD Welton FlakesKhan notified of asymptomatic blood pressure and new orders given to discontinue IMDUR and modify Cozaar to 25mg .

## 2018-07-18 NOTE — Progress Notes (Signed)
[facility] - [place] at [facility]   PATIENT NAME: Amanda [name]    MR#:  [id number]  DATE OF BIRTH:  September 26, 1965  SUBJECTIVE:  CHIEF COMPLAINT:   Chief Complaint  Patient presents with  . Shortness of Breath  Patient seen and evaluated today On oxygen via nasal cannula 2 L Has a shortness of breath Has swelling in the legs No chest pain  REVIEW OF SYSTEMS:    ROS  CONSTITUTIONAL: No documented fever. No fatigue, weakness. No weight gain, no weight loss.  EYES: No blurry or double vision.  ENT: No tinnitus. No postnasal drip. No redness of the oropharynx.  RESPIRATORY: No cough, no wheeze, no hemoptysis. Has dyspnea.  CARDIOVASCULAR: No chest pain. Has orthopnea. No palpitations. No syncope.  GASTROINTESTINAL: No nausea, no vomiting or diarrhea. No abdominal pain. No melena or hematochezia.  GENITOURINARY: No dysuria or hematuria.  ENDOCRINE: No polyuria or nocturia. No heat or cold intolerance.  HEMATOLOGY: No anemia. No bruising. No bleeding.  INTEGUMENTARY: No rashes. No lesions.  MUSCULOSKELETAL: No arthritis. Has swelling. No gout.  NEUROLOGIC: No numbness, tingling, or ataxia. No seizure-type activity.  PSYCHIATRIC: No anxiety. No insomnia. No ADD.   DRUG ALLERGIES:   Allergies  Allergen Reactions  . Penicillins Anaphylaxis    Has patient had a PCN reaction causing immediate rash, facial/tongue/throat swelling, SOB or lightheadedness with hypotension: Yes Has patient had a PCN reaction causing severe rash involving mucus membranes or skin necrosis: No Has patient had a PCN reaction that required hospitalization No Has patient had a PCN reaction occurring within the last 10 years: No If all of the above answers are "NO", then may proceed with Cephalosporin use.   . Ace Inhibitors Swelling    VITALS:  Blood pressure 108/73, pulse 75, temperature 98.6 F (37 C), temperature source Oral, resp. rate 18, height 5\' 5"  (1.651 m), weight 92.6 kg, SpO2  99 %.  PHYSICAL EXAMINATION:   Physical Exam  GENERAL:  52 y.o.-year-old patient lying in the bed with no acute distress.  EYES: Pupils equal, round, reactive to light and accommodation. No scleral icterus. Extraocular muscles intact.  HEENT: Head atraumatic, normocephalic. Oropharynx and nasopharynx clear.  NECK:  Supple, no jugular venous distention. No thyroid enlargement, no tenderness.  LUNGS: Decreased breath sounds bilaterally, bibasilar crepitations heard. No use of accessory muscles of respiration.  CARDIOVASCULAR: S1, S2 normal. No murmurs, rubs, or gallops.  ABDOMEN: Soft, nontender, nondistended. Bowel sounds present. No organomegaly or mass.  EXTREMITIES: No cyanosis, clubbing , has edema b/l.    NEUROLOGIC: Cranial nerves II through XII are intact. No focal Motor or sensory deficits b/l.   PSYCHIATRIC: The patient is alert and oriented x 3.  SKIN: No obvious rash, lesion, or ulcer.   LABORATORY PANEL:   CBC Recent Labs  Lab 07/18/18 0528  WBC 5.4  HGB 12.9  HCT 39.7  PLT 242   ------------------------------------------------------------------------------------------------------------------ Chemistries  Recent Labs  Lab 07/18/18 0528  NA 135  K 4.1  CL 94*  CO2 29  GLUCOSE 396*  BUN 28*  CREATININE 1.86*  CALCIUM 9.1   ------------------------------------------------------------------------------------------------------------------  Cardiac Enzymes Recent Labs  Lab 07/17/18 1147  TROPONINI <0.03   ------------------------------------------------------------------------------------------------------------------  RADIOLOGY:  Dg Chest 2 View  Result Date: 07/17/2018 CLINICAL DATA:  Shortness of breath with CHF. EXAM: CHEST - 2 VIEW COMPARISON:  Chest radiographs 03/28/2018 and CT 03/30/2018 FINDINGS: The cardiac silhouette remains mildly enlarged. The lungs remain hypoinflated without evidence of airspace consolidation, edema,  pleural effusion,  pneumothorax. No acute osseous abnormality is seen. Right upper quadrant abdominal surgical clips are noted. IMPRESSION: Low lung volumes without evidence of active cardiopulmonary disease. Electronically Signed   By: Sebastian Ache M.D.   On: 07/17/2018 12:03     ASSESSMENT AND PLAN:  52 year old female patient with history of diastolic heart failure, type 2 diabetes mellitus, fibromyalgia, GERD, sleep apnea, CVA currently under hospitalist service for shortness of breath  -Acute diastolic heart failure exacerbation Echocardiogram done on 07/05/2018 shows EF of 60% with diastolic dysfunction Continue IV Lasix for diuresis Input output chart Daily body weights Cardiology follow-up Cardiac stress test in August this year was normal according to cardiology attending along with the CT angiogram of the coronaries which had a calcium score of 0  -Hypertension uncontrolled Resume home hypertensive medications along with PRN hydralazine  -Type 2 diabetes mellitus Diabetic diet sliding scale coverage with insulin along with Levemir insulin  -Obstructive sleep apnea CPAP at bedtime  -DVT prophylaxis with subcu Lovenox daily  All the records are reviewed and case discussed with Care Management/Social Worker. Management plans discussed with the patient, family and they are in agreement.  CODE STATUS: Full code  DVT Prophylaxis: SCDs  TOTAL TIME TAKING CARE OF THIS PATIENT: 35 minutes.   POSSIBLE D/C IN 2 to 3 DAYS, DEPENDING ON CLINICAL CONDITION.  Ihor Austin M.D on 07/18/2018 at 12:11 PM  Between 7am to 6pm - Pager - [phone]  After 6pm go to [email or web address] - password [email or web address] [facility]  [facility] [place] [facility]  Office  [phone]  CC: Primary care physician; Emogene Morgan, MD  Note: This dictation was prepared with Dragon dictation along with smaller phrase technology. Any transcriptional errors that result from this process are unintentional.

## 2018-07-18 NOTE — Consult Note (Signed)
Amanda [name] is a 52 y.o. female  [id number]  Primary Cardiologist: Adrian Blackwater Reason for Consultation: CHF  HPI: This is a 52 year old African-American female with history of diastolic dysfunction presented to the hospital with shortness of breath and 2 to 3 pounds weight gain and pleuritic chest pain and cough.   Review of Systems: Patient does have some orthopnea PND and leg swelling   Past Medical History:  Diagnosis Date  . Arthritis    knees, shoulder, upper back  . Asthma   . CHF (congestive heart failure) (HCC)   . Diabetes mellitus without complication (HCC)   . Environmental allergies   . Fibromyalgia   . GERD (gastroesophageal reflux disease)   . Headache    migraines - 5x/mo  . Hypertension   . Hypokalemia 10/14/2017  . Migraines   . Motion sickness    ships  . Sleep apnea   . Stroke [facility])    no residual deficits  . Thyroid nodule    bilateral and goiter  . Wears contact lenses   . Wears dentures    partial upper    Medications Prior to Admission  Medication Sig Dispense Refill  . albuterol (PROVENTIL HFA) 108 (90 Base) MCG/ACT inhaler Inhale 2 puffs into the lungs every 4 (four) hours as needed for wheezing or shortness of breath. 3 Inhaler 1  . ARIPiprazole (ABILIFY) 2 MG tablet Take 2 mg by mouth daily.     . Ascorbic Acid (VITAMIN C) 1000 MG tablet Take 500 mg by mouth 2 (two) times daily.     [name] Kitchen aspirin EC 81 MG tablet Take 1 tablet (81 mg total) by mouth daily. (Patient taking differently: Take 162 mg by mouth daily. )    . BREO ELLIPTA 200-25 MCG/INH AEPB Inhale 1 puff into the lungs daily. (Patient taking differently: Inhale 1 puff into the lungs daily. ) 60 each 4  . canagliflozin (INVOKANA) 300 MG TABS tablet Take 300 mg by mouth daily before breakfast.    . clonazePAM (KLONOPIN) 0.5 MG tablet Take 0.5 mg by mouth 2 (two) times daily.    . DULoxetine (CYMBALTA) 60 MG capsule Take 60 mg by mouth 2 (two) times daily.    . flunisolide  (NASALIDE) 25 MCG/ACT (0.025%) SOLN Place 2 sprays into the nose daily as needed.    . furosemide (LASIX) 40 MG tablet Take 40 mg by mouth 2 (two) times daily.     [name] Kitchen gabapentin (NEURONTIN) 300 MG capsule Take 600 mg by mouth 2 (two) times daily.    [name] Kitchen HYDROcodone-acetaminophen (NORCO/VICODIN) 5-325 MG tablet Take 1-2 tablets by mouth at bedtime as needed for moderate pain.    [name] Kitchen insulin detemir (LEVEMIR) 100 UNIT/ML injection Inject 12-20 Units into the skin at bedtime.     [name] Kitchen ipratropium-albuterol (DUONEB) 0.5-2.5 (3) MG/3ML SOLN Inhale contents of 1 vial via nebulizer 3 times daily as needed for shortness of breath/wheeze/cough. (Patient taking differently: Inhale 3 mLs into the lungs 3 (three) times daily as needed (shortness of breath, wheezing or cough). ) 360 mL 3  . isosorbide mononitrate (IMDUR) 30 MG 24 hr tablet Take 1 tablet (30 mg total) by mouth daily.    [name] Kitchen lidocaine (LIDODERM) 5 % Place 1 patch onto the skin daily. (remove after 12 hours and apply new patch 12 hours later)  1  . LINZESS 290 MCG CAPS capsule Take 1 tablet by mouth daily before breakfast.     . losartan (COZAAR) 50 MG tablet  Take 50 mg by mouth daily.    . meloxicam (MOBIC) 7.5 MG tablet Take 1 tablet (7.5 mg total) by mouth daily. (Patient taking differently: Take 15 mg by mouth daily. ) 7 tablet 0  . metFORMIN (GLUCOPHAGE) 1000 MG tablet Take 1,000 mg by mouth 2 (two) times daily with a meal.    . metolazone (ZAROXOLYN) 2.5 MG tablet Take 2.5 mg by mouth daily.    . metoprolol succinate (TOPROL-XL) 50 MG 24 hr tablet Take 125 mg by mouth daily. Take with or immediately following a meal.     . mirtazapine (REMERON SOL-TAB) 30 MG disintegrating tablet Take 30 mg by mouth daily.   2  . montelukast (SINGULAIR) 10 MG tablet Take 1 tablet (10 mg total) by mouth daily. 30 tablet 2  . Multiple Vitamin (MULTIVITAMIN) tablet Take 1 tablet by mouth daily.    [name] Kitchen omeprazole (PRILOSEC) 40 MG capsule Take 1 capsule (40 mg total) by mouth  daily. 1 hr before supper 30 capsule 0  . Potassium Chloride ER 20 MEQ TBCR Take 20 mEq by mouth 2 (two) times daily.   2  . pravastatin (PRAVACHOL) 80 MG tablet Take 1 tablet (80 mg total) by mouth daily. (Patient taking differently: Take 40 mg by mouth daily. ) 30 tablet 0  . sitaGLIPtin (JANUVIA) 100 MG tablet Take 100 mg by mouth daily.    . sucralfate (CARAFATE) 1 g tablet Take 1 g by mouth 2 (two) times daily.     . SUMAtriptan (IMITREX) 25 MG tablet Take 25 mg by mouth every 2 (two) hours as needed for migraine. May repeat in 2 hours if headache persists or recurs.    [name] Kitchen tiotropium (SPIRIVA HANDIHALER) 18 MCG inhalation capsule Place 1 capsule (18 mcg total) into inhaler and inhale daily. 90 capsule 2  . tiZANidine (ZANAFLEX) 4 MG tablet Take 4 mg by mouth every 8 (eight) hours as needed for muscle spasms.     . traMADol (ULTRAM) 50 MG tablet Take 50 mg by mouth every 6 (six) hours as needed for moderate pain.    [name] Kitchen zolpidem (AMBIEN) 10 MG tablet Take 10 mg by mouth at bedtime as needed for sleep.        . ARIPiprazole  2 mg Oral Daily  . aspirin EC  162 mg Oral Daily  . clonazePAM  0.5 mg Oral BID  . DULoxetine  60 mg Oral BID  . enoxaparin (LOVENOX) injection  40 mg Subcutaneous Q24H  . fluticasone furoate-vilanterol  1 puff Inhalation Daily  . furosemide  60 mg Intravenous Q12H  . gabapentin  600 mg Oral BID  . insulin aspart  0-5 Units Subcutaneous QHS  . insulin aspart  0-9 Units Subcutaneous TID WC  . insulin detemir  12 Units Subcutaneous QHS  . isosorbide mononitrate  30 mg Oral Daily  . linaclotide  290 mcg Oral QAC breakfast  . losartan  50 mg Oral Daily  . metolazone  2.5 mg Oral Daily  . metoprolol succinate  125 mg Oral Daily  . mirtazapine  30 mg Oral Daily  . montelukast  10 mg Oral Daily  . multivitamin with minerals  1 tablet Oral Daily  . pantoprazole  40 mg Oral Daily  . potassium chloride SA  20 mEq Oral BID  . pravastatin  40 mg Oral Daily  . sucralfate  1  g Oral BID  . tiotropium  18 mcg Inhalation Daily  . vitamin C  500 mg Oral BID  Infusions:   Allergies  Allergen Reactions  . Penicillins Anaphylaxis    Has patient had a PCN reaction causing immediate rash, facial/tongue/throat swelling, SOB or lightheadedness with hypotension: Yes Has patient had a PCN reaction causing severe rash involving mucus membranes or skin necrosis: No Has patient had a PCN reaction that required hospitalization No Has patient had a PCN reaction occurring within the last 10 years: No If all of the above answers are "NO", then may proceed with Cephalosporin use.   . Ace Inhibitors Swelling    Social History   Socioeconomic History  . Marital status: Divorced    Spouse name: Not on file  . Number of children: Not on file  . Years of education: Not on file  . Highest education level: Not on file  Occupational History  . Not on file  Social Needs  . Financial resource strain: Not on file  . Food insecurity:    Worry: Not on file    Inability: Not on file  . Transportation needs:    Medical: Not on file    Non-medical: Not on file  Tobacco Use  . Smoking status: Never Smoker  . Smokeless tobacco: Never Used  Substance and Sexual Activity  . Alcohol use: No  . Drug use: No  . Sexual activity: Yes    Birth control/protection: Surgical  Lifestyle  . Physical activity:    Days per week: Not on file    Minutes per session: Not on file  . Stress: Not on file  Relationships  . Social connections:    Talks on phone: Not on file    Gets together: Not on file    Attends religious service: Not on file    Active member of club or organization: Not on file    Attends meetings of clubs or organizations: Not on file    Relationship status: Not on file  . Intimate partner violence:    Fear of current or ex partner: Not on file    Emotionally abused: Not on file    Physically abused: Not on file    Forced sexual activity: Not on file  Other  Topics Concern  . Not on file  Social History Narrative   Lives at home with son (who is mentally ill)   Ambulates independently.    Family History  Problem Relation Age of Onset  . Hypertension Mother   . Hypertension Father   . Diabetes Father   . Allergies Father   . Breast cancer Neg Hx     PHYSICAL EXAM: Vitals:   07/18/18 0425 07/18/18 0817  BP: 112/68 108/73  Pulse: 74 75  Resp: 18 18  Temp: (!) 97.4 F (36.3 C) 98.6 F (37 C)  SpO2: 98% 99%     Intake/Output Summary (Last 24 hours) at 07/18/2018 1128 Last data filed at 07/18/2018 0904 Gross per 24 hour  Intake -  Output 900 ml  Net -900 ml    General:  Well appearing. No respiratory difficulty HEENT: normal Neck: supple. no JVD. Carotids 2+ bilat; no bruits. No lymphadenopathy or thryomegaly appreciated. Cor: PMI nondisplaced. Regular rate & rhythm. No rubs, gallops or murmurs. Lungs: clear Abdomen: soft, nontender, nondistended. No hepatosplenomegaly. No bruits or masses. Good bowel sounds. Extremities: no cyanosis, clubbing, rash, edema Neuro: alert & oriented x 3, cranial nerves grossly intact. moves all 4 extremities w/o difficulty. Affect pleasant.  ECG: Normal sinus rhythm nonspecific ST-T changes  Results for orders placed or performed during  the hospital encounter of 07/17/18 (from the past 24 hour(s))  Basic metabolic panel     Status: Abnormal   Collection Time: 07/17/18 11:47 AM  Result Value Ref Range   Sodium 139 135 - 145 mmol/L   Potassium 3.8 3.5 - 5.1 mmol/L   Chloride 103 98 - 111 mmol/L   CO2 29 22 - 32 mmol/L   Glucose, Bld 263 (H) 70 - 99 mg/dL   BUN 15 6 - 20 mg/dL   Creatinine, Ser 0.98 0.44 - 1.00 mg/dL   Calcium 11.9 8.9 - 14.7 mg/dL   GFR calc non Af Amer >60 >60 mL/min   GFR calc Af Amer >60 >60 mL/min   Anion gap 7 5 - 15  CBC     Status: None   Collection Time: 07/17/18 11:47 AM  Result Value Ref Range   WBC 5.3 4.0 - 10.5 K/uL   RBC 4.54 3.87 - 5.11 MIL/uL    Hemoglobin 13.6 12.0 - 15.0 g/dL   HCT 82.9 56.2 - 13.0 %   MCV 93.8 80.0 - 100.0 fL   MCH 30.0 26.0 - 34.0 pg   MCHC 31.9 30.0 - 36.0 g/dL   RDW 86.5 78.4 - 69.6 %   Platelets 273 150 - 400 K/uL   nRBC 0.0 0.0 - 0.2 %  Troponin I - ONCE - STAT     Status: None   Collection Time: 07/17/18 11:47 AM  Result Value Ref Range   Troponin I <0.03 <0.03 ng/mL  Brain natriuretic peptide     Status: None   Collection Time: 07/17/18 11:47 AM  Result Value Ref Range   B Natriuretic Peptide 39.0 0.0 - 100.0 pg/mL  Glucose, capillary     Status: Abnormal   Collection Time: 07/17/18  5:22 PM  Result Value Ref Range   Glucose-Capillary 254 (H) 70 - 99 mg/dL  Glucose, capillary     Status: Abnormal   Collection Time: 07/17/18 10:14 PM  Result Value Ref Range   Glucose-Capillary 224 (H) 70 - 99 mg/dL  Basic metabolic panel     Status: Abnormal   Collection Time: 07/18/18  5:28 AM  Result Value Ref Range   Sodium 135 135 - 145 mmol/L   Potassium 4.1 3.5 - 5.1 mmol/L   Chloride 94 (L) 98 - 111 mmol/L   CO2 29 22 - 32 mmol/L   Glucose, Bld 396 (H) 70 - 99 mg/dL   BUN 28 (H) 6 - 20 mg/dL   Creatinine, Ser 2.95 (H) 0.44 - 1.00 mg/dL   Calcium 9.1 8.9 - 28.4 mg/dL   GFR calc non Af Amer 30 (L) >60 mL/min   GFR calc Af Amer 35 (L) >60 mL/min   Anion gap 12 5 - 15  CBC     Status: None   Collection Time: 07/18/18  5:28 AM  Result Value Ref Range   WBC 5.4 4.0 - 10.5 K/uL   RBC 4.20 3.87 - 5.11 MIL/uL   Hemoglobin 12.9 12.0 - 15.0 g/dL   HCT 13.2 44.0 - 10.2 %   MCV 94.5 80.0 - 100.0 fL   MCH 30.7 26.0 - 34.0 pg   MCHC 32.5 30.0 - 36.0 g/dL   RDW 72.5 36.6 - 44.0 %   Platelets 242 150 - 400 K/uL   nRBC 0.0 0.0 - 0.2 %   Dg Chest 2 View  Result Date: 07/17/2018 CLINICAL DATA:  Shortness of breath with CHF. EXAM: CHEST - 2 VIEW COMPARISON:  Chest radiographs 03/28/2018 and CT 03/30/2018 FINDINGS: The cardiac silhouette remains mildly enlarged. The lungs remain hypoinflated without evidence  of airspace consolidation, edema, pleural effusion, pneumothorax. No acute osseous abnormality is seen. Right upper quadrant abdominal surgical clips are noted. IMPRESSION: Low lung volumes without evidence of active cardiopulmonary disease. Electronically Signed   By: Sebastian Ache M.D.   On: 07/17/2018 12:03     ASSESSMENT AND PLAN: Diastolic dysfunction with left ventricular ejection fraction 60% on echocardiogram done on 07/05/2018 in the office with mild diastolic dysfunction and normal wall motion.  Patient had nuclear stress test August this year which was normal with normal ejection fraction.  She also had normal coronaries with calcium score 0 on CTA coronary in 2017 in my office.  She does have diastolic dysfunction and agree with giving IV Lasix.  However patient should be evaluated by pulmonary also while she is in the hospital because some of the symptoms may be related to possible COPD.  Jelena Malicoat A

## 2018-07-19 ENCOUNTER — Inpatient Hospital Stay: Payer: 59

## 2018-07-19 LAB — BASIC METABOLIC PANEL
Anion gap: 14 (ref 5–15)
BUN: 53 mg/dL — ABNORMAL HIGH (ref 6–20)
CO2: 28 mmol/L (ref 22–32)
Calcium: 9.6 mg/dL (ref 8.9–10.3)
Chloride: 93 mmol/L — ABNORMAL LOW (ref 98–111)
Creatinine, Ser: 2.07 mg/dL — ABNORMAL HIGH (ref 0.44–1.00)
GFR calc Af Amer: 30 mL/min — ABNORMAL LOW (ref >60)
GFR calc non Af Amer: 26 mL/min — ABNORMAL LOW (ref >60)
Glucose, Bld: 267 mg/dL — ABNORMAL HIGH (ref 70–99)
Potassium: 4.3 mmol/L (ref 3.5–5.1)
Sodium: 135 mmol/L (ref 135–145)

## 2018-07-19 LAB — GLUCOSE, CAPILLARY
Glucose-Capillary: 213 mg/dL — ABNORMAL HIGH (ref 70–99)
Glucose-Capillary: 257 mg/dL — ABNORMAL HIGH (ref 70–99)
Glucose-Capillary: 201 mg/dL — ABNORMAL HIGH (ref 70–99)
Glucose-Capillary: 174 mg/dL — ABNORMAL HIGH (ref 70–99)

## 2018-07-19 MED ORDER — METOPROLOL SUCCINATE ER 25 MG PO TB24
25.0000 mg | ORAL_TABLET | Freq: Every day | ORAL | Status: DC
  Administered 2018-07-20: 25 mg via ORAL
  Filled 2018-07-19: qty 1

## 2018-07-19 MED ORDER — SODIUM CHLORIDE 0.9 % IV SOLN
INTRAVENOUS | Status: DC
  Administered 2018-07-19: 16:00:00 via INTRAVENOUS

## 2018-07-19 NOTE — Progress Notes (Signed)
SATURATION QUALIFICATIONS: (This note is used to comply with regulatory documentation for home oxygen)  Patient Saturations on Room Air at Rest = 99%  Patient Saturations on Room Air while Ambulating = 94%  Patient Saturations on 0 Liters of oxygen while Ambulating = 94%  Please briefly explain why patient needs home oxygen: N/A  Amanda BlendSteven M Shylah [name]

## 2018-07-19 NOTE — Progress Notes (Signed)
SUBJECTIVE: Patient is still short of breath   Vitals:   07/18/18 1925 07/18/18 2200 07/19/18 0434 07/19/18 0803  BP: (!) 98/54 122/69 (!) 95/53 (!) 91/45  Pulse: 74  65 61  Resp: 16   18  Temp: 97.8 F (36.6 C)  98.4 F (36.9 C) 97.8 F (36.6 C)  TempSrc: Oral  Oral Oral  SpO2: 97%  92% 98%  Weight:   93.3 kg   Height:        Intake/Output Summary (Last 24 hours) at 07/19/2018 1100 Last data filed at 07/19/2018 0439 Gross per 24 hour  Intake -  Output 1250 ml  Net -1250 ml    LABS: Basic Metabolic Panel: Recent Labs    07/18/18 0528 07/19/18 0516  NA 135 135  K 4.1 4.3  CL 94* 93*  CO2 29 28  GLUCOSE 396* 267*  BUN 28* 53*  CREATININE 1.86* 2.07*  CALCIUM 9.1 9.6   Liver Function Tests: No results for input(s): AST, ALT, ALKPHOS, BILITOT, PROT, ALBUMIN in the last 72 hours. No results for input(s): LIPASE, AMYLASE in the last 72 hours. CBC: Recent Labs    07/17/18 1147 07/18/18 0528  WBC 5.3 5.4  HGB 13.6 12.9  HCT 42.6 39.7  MCV 93.8 94.5  PLT 273 242   Cardiac Enzymes: Recent Labs    07/17/18 1147  TROPONINI <0.03   BNP: Invalid input(s): POCBNP D-Dimer: No results for input(s): DDIMER in the last 72 hours. Hemoglobin A1C: No results for input(s): HGBA1C in the last 72 hours. Fasting Lipid Panel: No results for input(s): CHOL, HDL, LDLCALC, TRIG, CHOLHDL, LDLDIRECT in the last 72 hours. Thyroid Function Tests: No results for input(s): TSH, T4TOTAL, T3FREE, THYROIDAB in the last 72 hours.  Invalid input(s): FREET3 Anemia Panel: No results for input(s): VITAMINB12, FOLATE, FERRITIN, TIBC, IRON, RETICCTPCT in the last 72 hours.   PHYSICAL EXAM General: Well developed, well nourished, in no acute distress HEENT:  Normocephalic and atramatic Neck:  No JVD.  Lungs: Clear bilaterally to auscultation and percussion. Heart: HRRR . Normal S1 and S2 without gallops or murmurs.  Abdomen: Bowel sounds are positive, abdomen soft and non-tender   Msk:  Back normal, normal gait. Normal strength and tone for age. Extremities: No clubbing, cyanosis or edema.   Neuro: Alert and oriented X 3. Psych:  Good affect, responds appropriately  TELEMETRY: Sinus rhythm  ASSESSMENT AND PLAN: Congestive heart failure due to diastolic dysfunction and renal insufficiency and hypotension.  I did stop the patient's isosorbide since patient was hypotensive and decreased losartan to 25 mg from 50 mg.  May have to stop the losartan and hold Lasix because of hypotension and creatinine being over 2.  May have to get renal consult along with pulmonary consult.  Shortness of breath is out of proportion of her history of heart failure.  Thus needs pulmonary consult.  Active Problems:   Acute on chronic diastolic (congestive) heart failure (HCC)    Nazaret Chea A, MD, [facility] 07/19/2018 11:00 AM

## 2018-07-19 NOTE — Progress Notes (Signed)
[facility] [place] [facility]   PATIENT NAME: Amanda [name]    MR#:  [id number]  DATE OF BIRTH:  17-Jul-1966  SUBJECTIVE:  CHIEF COMPLAINT:   Chief Complaint  Patient presents with  . Shortness of Breath  Patient seen and evaluated today On oxygen via nasal cannula 1 L Has  shortness of breath on ambulation  swelling in the legs improved No chest pain  REVIEW OF SYSTEMS:    ROS  CONSTITUTIONAL: No documented fever. No fatigue, weakness. No weight gain, no weight loss.  EYES: No blurry or double vision.  ENT: No tinnitus. No postnasal drip. No redness of the oropharynx.  RESPIRATORY: No cough, no wheeze, no hemoptysis. Has dyspnea.  CARDIOVASCULAR: No chest pain. Has orthopnea. No palpitations. No syncope.  GASTROINTESTINAL: No nausea, no vomiting or diarrhea. No abdominal pain. No melena or hematochezia.  GENITOURINARY: No dysuria or hematuria.  ENDOCRINE: No polyuria or nocturia. No heat or cold intolerance.  HEMATOLOGY: No anemia. No bruising. No bleeding.  INTEGUMENTARY: No rashes. No lesions.  MUSCULOSKELETAL: No arthritis. Has swelling. No gout.  NEUROLOGIC: No numbness, tingling, or ataxia. No seizure-type activity.  PSYCHIATRIC: No anxiety. No insomnia. No ADD.   DRUG ALLERGIES:   Allergies  Allergen Reactions  . Penicillins Anaphylaxis    Has patient had a PCN reaction causing immediate rash, facial/tongue/throat swelling, SOB or lightheadedness with hypotension: Yes Has patient had a PCN reaction causing severe rash involving mucus membranes or skin necrosis: No Has patient had a PCN reaction that required hospitalization No Has patient had a PCN reaction occurring within the last 10 years: No If all of the above answers are "NO", then may proceed with Cephalosporin use.   . Ace Inhibitors Swelling    VITALS:  Blood pressure (!) 91/45, pulse 61, temperature 97.8 F (36.6 C), temperature source Oral, resp. rate 18, height 5\' 5"  (1.651  m), weight 93.3 kg, SpO2 98 %.  PHYSICAL EXAMINATION:   Physical Exam  GENERAL:  52 y.o.-year-old patient lying in the bed with no acute distress.  EYES: Pupils equal, round, reactive to light and accommodation. No scleral icterus. Extraocular muscles intact.  HEENT: Head atraumatic, normocephalic. Oropharynx and nasopharynx clear.  NECK:  Supple, no jugular venous distention. No thyroid enlargement, no tenderness.  LUNGS: Decreased breath sounds bilaterally, bibasilar crepitations heard. No use of accessory muscles of respiration.  CARDIOVASCULAR: S1, S2 normal. No murmurs, rubs, or gallops.  ABDOMEN: Soft, nontender, nondistended. Bowel sounds present. No organomegaly or mass.  EXTREMITIES: No cyanosis, clubbing , has decreased edema b/l.    NEUROLOGIC: Cranial nerves II through XII are intact. No focal Motor or sensory deficits b/l.   PSYCHIATRIC: The patient is alert and oriented x 3.  SKIN: No obvious rash, lesion, or ulcer.   LABORATORY PANEL:   CBC Recent Labs  Lab 07/18/18 0528  WBC 5.4  HGB 12.9  HCT 39.7  PLT 242   ------------------------------------------------------------------------------------------------------------------ Chemistries  Recent Labs  Lab 07/19/18 0516  NA 135  K 4.3  CL 93*  CO2 28  GLUCOSE 267*  BUN 53*  CREATININE 2.07*  CALCIUM 9.6   ------------------------------------------------------------------------------------------------------------------  Cardiac Enzymes Recent Labs  Lab 07/17/18 1147  TROPONINI <0.03   ------------------------------------------------------------------------------------------------------------------  RADIOLOGY:  Dg Chest 2 View  Result Date: 07/17/2018 CLINICAL DATA:  Shortness of breath with CHF. EXAM: CHEST - 2 VIEW COMPARISON:  Chest radiographs 03/28/2018 and CT 03/30/2018 FINDINGS: The cardiac silhouette remains mildly enlarged. The lungs remain hypoinflated without  evidence of airspace  consolidation, edema, pleural effusion, pneumothorax. No acute osseous abnormality is seen. Right upper quadrant abdominal surgical clips are noted. IMPRESSION: Low lung volumes without evidence of active cardiopulmonary disease. Electronically Signed   By: Sebastian Ache M.D.   On: 07/17/2018 12:03     ASSESSMENT AND PLAN:  52 year old female patient with history of diastolic heart failure, type 2 diabetes mellitus, fibromyalgia, GERD, sleep apnea, CVA currently under hospitalist service for shortness of breath  -Hypotension Hold Lasix Hold losartan Decreased dose of metoprolol from 125 mg daily to 25 mg daily  -Dyspnea Pulmonary consult  -Acute diastolic heart failure exacerbation Echocardiogram done on 07/05/2018 shows EF of 60% with diastolic dysfunction Lasix on hold secondary to hypotension Input output chart Daily body weights Cardiology follow-up appreciated Cardiac stress test in August this year was normal according to cardiology attending along with the CT angiogram of the coronaries which had a calcium score of 0  -Acute kidney injury Lasix and losartan on hold Monitor renal function Nephrology consultation  -Type 2 diabetes mellitus Diabetic diet sliding scale coverage with insulin along with Levemir insulin  -Obstructive sleep apnea CPAP at bedtime  -DVT prophylaxis with subcu Lovenox daily  All the records are reviewed and case discussed with Care Management/Social Worker. Management plans discussed with the patient, family and they are in agreement.  CODE STATUS: Full code  DVT Prophylaxis: SCDs  TOTAL TIME TAKING CARE OF THIS PATIENT: 33 minutes.   POSSIBLE D/C IN 2 to 3 DAYS, DEPENDING ON CLINICAL CONDITION.  Ihor Austin M.D on 07/19/2018 at 11:42 AM  Between 7am to 6pm - Pager - [phone]  After 6pm go to [email or web address] - password [email or web address] [facility]  [facility] [place] [facility]  Office  [phone]  CC: Primary care physician; Emogene Morgan,  MD  Note: This dictation was prepared with Dragon dictation along with smaller phrase technology. Any transcriptional errors that result from this process are unintentional.

## 2018-07-19 NOTE — Plan of Care (Signed)
  Problem: Health Behavior/Discharge Planning: Goal: Ability to manage health-related needs will improve Outcome: Not Progressing Note:  B.P. remains on the low end of normal this morning at only 91 systolic. IV Lasix being held. Cozaar discontinued. Didn't qualify for home oxygen tank today. Will continue to monitor respiratory / fluid status. Jari FavreSteven M [facility]

## 2018-07-19 NOTE — Progress Notes (Signed)
BP this AM 95/53. Pt asymptomatic. 60 IVP Lasix due this morning at 0515. Prime doc notified. Per orders, will hold morning Lasix dose. Will continue to monitor.

## 2018-07-19 NOTE — Progress Notes (Signed)
Advanced care plan.  Purpose of the Encounter: CODE STATUS  Parties in Attendance: Patient  Patient's Decision Capacity: Good  Subjective/Patient's story: Presented to emergency room for shortness of breath   Objective/Medical story Has hypoxic respiratory distress secondary to heart failure exacerbation Needs IV Lasix for diuresis and cardiology work-up advance   Goals of care determination:  Advance care directives and goals of care discussed Patient wants everything done which includes CPR, intubation ventilator the need arises   CODE STATUS: Full code   Time spent discussing advanced care planning: 16 minutes

## 2018-07-19 NOTE — Consult Note (Signed)
[facility] CONSULT NOTE    Date: 07/19/2018                  Patient Name:  Amanda [name]  MRN: [id number]  DOB: 1966-08-12  Age / Sex: 52 y.o., female         PCP: Emogene Morgan, MD                 Service Requesting Consult: Hospitalist                 Reason for Consult: Acute renal failure            History of Present Illness: Patient is a 52 y.o. female with a PMHx of osteoarthritis, asthma, chronic diastolic heart failure, diabetes mellitus type 2, fibromyalgia, GERD, hypertension, migraine headaches, obstructive sleep apnea, history of CVA, who was admitted to [facility] on 07/17/2018 for evaluation of aggressive shortness of breath.  She states that she is gained approximately 18 pounds over the past 3 weeks.  She has been under the care of the heart failure center as an outpatient.  She was started on diuretics however her renal function has deteriorated.  Patient has also had periods of hypotension this admission.  Her baseline creatinine is 0.68 from July 08, 2018.  Creatinine now up to 2.07.  She denies NSAID ingestion at home.   Medications: Outpatient medications: Medications Prior to Admission  Medication Sig Dispense Refill Last Dose  . albuterol (PROVENTIL HFA) 108 (90 Base) MCG/ACT inhaler Inhale 2 puffs into the lungs every 4 (four) hours as needed for wheezing or shortness of breath. 3 Inhaler 1 Past Week at PRN  . ARIPiprazole (ABILIFY) 2 MG tablet Take 2 mg by mouth daily.    07/17/2018 at 0800  . Ascorbic Acid (VITAMIN C) 1000 MG tablet Take 500 mg by mouth 2 (two) times daily.    07/17/2018 at 0800  . aspirin EC 81 MG tablet Take 1 tablet (81 mg total) by mouth daily. (Patient taking differently: Take 162 mg by mouth daily. )   07/17/2018 at 0800  . BREO ELLIPTA 200-25 MCG/INH AEPB Inhale 1 puff into the lungs daily. (Patient taking differently: Inhale 1 puff into the lungs daily. ) 60 each 4 07/17/2018 at 0800  . canagliflozin  (INVOKANA) 300 MG TABS tablet Take 300 mg by mouth daily before breakfast.   07/17/2018 at 0800  . clonazePAM (KLONOPIN) 0.5 MG tablet Take 0.5 mg by mouth 2 (two) times daily.   07/17/2018 at 0800  . DULoxetine (CYMBALTA) 60 MG capsule Take 60 mg by mouth 2 (two) times daily.   07/17/2018 at 0800  . flunisolide (NASALIDE) 25 MCG/ACT (0.025%) SOLN Place 2 sprays into the nose daily as needed.   Unknown at PRN  . furosemide (LASIX) 40 MG tablet Take 40 mg by mouth 2 (two) times daily.    07/17/2018 at 0800  . gabapentin (NEURONTIN) 300 MG capsule Take 600 mg by mouth 2 (two) times daily.   07/17/2018 at 0800  . HYDROcodone-acetaminophen (NORCO/VICODIN) 5-325 MG tablet Take 1-2 tablets by mouth at bedtime as needed for moderate pain.   Past Week at PRN  . insulin detemir (LEVEMIR) 100 UNIT/ML injection Inject 12-20 Units into the skin at bedtime.    07/16/2018 at 2000  . ipratropium-albuterol (DUONEB) 0.5-2.5 (3) MG/3ML SOLN Inhale contents of 1 vial via nebulizer 3 times daily as needed for shortness of breath/wheeze/cough. (Patient taking differently: Inhale 3 mLs into  the lungs 3 (three) times daily as needed (shortness of breath, wheezing or cough). ) 360 mL 3 Past Month at PRN  . isosorbide mononitrate (IMDUR) 30 MG 24 hr tablet Take 1 tablet (30 mg total) by mouth daily.   07/17/2018 at 0800  . lidocaine (LIDODERM) 5 % Place 1 patch onto the skin daily. (remove after 12 hours and apply new patch 12 hours later)  1 07/17/2018 at 0800  . LINZESS 290 MCG CAPS capsule Take 1 tablet by mouth daily before breakfast.    07/17/2018 at 0700  . losartan (COZAAR) 50 MG tablet Take 50 mg by mouth daily.   07/17/2018 at 0800  . meloxicam (MOBIC) 7.5 MG tablet Take 1 tablet (7.5 mg total) by mouth daily. (Patient taking differently: Take 15 mg by mouth daily. ) 7 tablet 0 07/17/2018 at 0800  . metFORMIN (GLUCOPHAGE) 1000 MG tablet Take 1,000 mg by mouth 2 (two) times daily with a meal.   07/17/2018 at 0800  .  metolazone (ZAROXOLYN) 2.5 MG tablet Take 2.5 mg by mouth daily.   07/17/2018 at 0800  . metoprolol succinate (TOPROL-XL) 50 MG 24 hr tablet Take 125 mg by mouth daily. Take with or immediately following a meal.    07/17/2018 at 0800  . mirtazapine (REMERON SOL-TAB) 30 MG disintegrating tablet Take 30 mg by mouth daily.   2 07/16/2018 at Unknown time  . montelukast (SINGULAIR) 10 MG tablet Take 1 tablet (10 mg total) by mouth daily. 30 tablet 2 07/16/2018 at 2000  . Multiple Vitamin (MULTIVITAMIN) tablet Take 1 tablet by mouth daily.   07/17/2018 at 0800  . omeprazole (PRILOSEC) 40 MG capsule Take 1 capsule (40 mg total) by mouth daily. 1 hr before supper 30 capsule 0 07/16/2018 at 1800  . Potassium Chloride ER 20 MEQ TBCR Take 20 mEq by mouth 2 (two) times daily.   2 07/17/2018 at 0800  . pravastatin (PRAVACHOL) 80 MG tablet Take 1 tablet (80 mg total) by mouth daily. (Patient taking differently: Take 40 mg by mouth daily. ) 30 tablet 0 07/16/2018 at 2000  . sitaGLIPtin (JANUVIA) 100 MG tablet Take 100 mg by mouth daily.   07/17/2018 at 0800  . sucralfate (CARAFATE) 1 g tablet Take 1 g by mouth 2 (two) times daily.    07/17/2018 at 0800  . SUMAtriptan (IMITREX) 25 MG tablet Take 25 mg by mouth every 2 (two) hours as needed for migraine. May repeat in 2 hours if headache persists or recurs.   Unknown at PRN  . tiotropium (SPIRIVA HANDIHALER) 18 MCG inhalation capsule Place 1 capsule (18 mcg total) into inhaler and inhale daily. 90 capsule 2 07/17/2018 at 0800  . tiZANidine (ZANAFLEX) 4 MG tablet Take 4 mg by mouth every 8 (eight) hours as needed for muscle spasms.    Past Week at PRN  . traMADol (ULTRAM) 50 MG tablet Take 50 mg by mouth every 6 (six) hours as needed for moderate pain.   Past Week at Unknown  . zolpidem (AMBIEN) 10 MG tablet Take 10 mg by mouth at bedtime as needed for sleep.    07/16/2018 at 2000    Current medications: Current Facility-Administered Medications  Medication Dose  Route Frequency Provider Last Rate Last Dose  . acetaminophen (TYLENOL) tablet 650 mg  650 mg Oral Q6H PRN Shaune Pollack, MD       Or  . acetaminophen (TYLENOL) suppository 650 mg  650 mg Rectal Q6H PRN Shaune Pollack, MD      .  albuterol (PROVENTIL) (2.5 MG/3ML) 0.083% nebulizer solution 2.5 mg  2.5 mg Nebulization Q2H PRN Shaune Pollack, MD   2.5 mg at 07/18/18 0427  . ARIPiprazole (ABILIFY) tablet 2 mg  2 mg Oral Daily Shaune Pollack, MD   2 mg at 07/19/18 1610  . aspirin EC tablet 162 mg  162 mg Oral Daily Shaune Pollack, MD   162 mg at 07/19/18 0936  . bisacodyl (DULCOLAX) EC tablet 5 mg  5 mg Oral Daily PRN Shaune Pollack, MD      . clonazePAM [facility]) tablet 0.5 mg  0.5 mg Oral BID Shaune Pollack, MD   0.5 mg at 07/19/18 0940  . DULoxetine (CYMBALTA) DR capsule 60 mg  60 mg Oral BID Shaune Pollack, MD   60 mg at 07/19/18 9604  . enoxaparin (LOVENOX) injection 40 mg  40 mg Subcutaneous Q24H Shaune Pollack, MD   40 mg at 07/18/18 2201  . fluticasone furoate-vilanterol (BREO ELLIPTA) 200-25 MCG/INH 1 puff  1 puff Inhalation Daily Shaune Pollack, MD   1 puff at 07/19/18 0935  . gabapentin (NEURONTIN) capsule 600 mg  600 mg Oral BID Shaune Pollack, MD   600 mg at 07/19/18 5409  . HYDROcodone-acetaminophen (NORCO/VICODIN) 5-325 MG per tablet 1-2 tablet  1-2 tablet Oral Q4H PRN Shaune Pollack, MD   1 tablet at 07/18/18 2202  . insulin aspart (novoLOG) injection 0-5 Units  0-5 Units Subcutaneous QHS Shaune Pollack, MD   3 Units at 07/18/18 2203  . insulin aspart (novoLOG) injection 0-9 Units  0-9 Units Subcutaneous TID WC Shaune Pollack, MD   5 Units at 07/19/18 1127  . insulin detemir (LEVEMIR) injection 12 Units  12 Units Subcutaneous QHS Shaune Pollack, MD   12 Units at 07/18/18 2204  . linaclotide (LINZESS) capsule 290 mcg  290 mcg Oral QAC breakfast Shaune Pollack, MD   290 mcg at 07/19/18 [phone]  . metolazone (ZAROXOLYN) tablet 2.5 mg  2.5 mg Oral Daily Shaune Pollack, MD   2.5 mg at 07/19/18 1478  . [START ON 07/20/2018] metoprolol succinate (TOPROL-XL) 24  hr tablet 25 mg  25 mg Oral Daily Pyreddy, Pavan, MD      . mirtazapine (REMERON SOL-TAB) disintegrating tablet 30 mg  30 mg Oral Daily Shaune Pollack, MD   30 mg at 07/18/18 2205  . montelukast (SINGULAIR) tablet 10 mg  10 mg Oral Daily Shaune Pollack, MD   10 mg at 07/18/18 2202  . multivitamin with minerals tablet 1 tablet  1 tablet Oral Daily Shaune Pollack, MD   1 tablet at 07/19/18 [phone]  . ondansetron (ZOFRAN) tablet 4 mg  4 mg Oral Q6H PRN Shaune Pollack, MD   4 mg at 07/19/18 0935   Or  . ondansetron [facility]) injection 4 mg  4 mg Intravenous Q6H PRN Shaune Pollack, MD   4 mg at 07/17/18 1745  . pantoprazole (PROTONIX) EC tablet 40 mg  40 mg Oral Daily Shaune Pollack, MD   40 mg at 07/18/18 1700  . pravastatin (PRAVACHOL) tablet 40 mg  40 mg Oral Daily Shaune Pollack, MD   40 mg at 07/18/18 1700  . senna-docusate (Senokot-S) tablet 1 tablet  1 tablet Oral QHS PRN Shaune Pollack, MD      . sucralfate (CARAFATE) tablet 1 g  1 g Oral BID Shaune Pollack, MD   1 g at 07/19/18 0936  . SUMAtriptan (IMITREX) tablet 25 mg  25 mg Oral Q2H PRN Shaune Pollack, MD      . tiotropium [facility])  inhalation capsule ([facility] use ONLY) 18 mcg  18 mcg Inhalation Daily Shaune Pollack, MD   18 mcg at 07/19/18 0936  . tiZANidine (ZANAFLEX) tablet 4 mg  4 mg Oral Q8H PRN Shaune Pollack, MD   4 mg at 07/19/18 0934  . vitamin C (ASCORBIC ACID) tablet 500 mg  500 mg Oral BID Shaune Pollack, MD   500 mg at 07/19/18 1610  . zolpidem (AMBIEN) tablet 5 mg  5 mg Oral QHS PRN Shaune Pollack, MD          Allergies: Allergies  Allergen Reactions  . Penicillins Anaphylaxis    Has patient had a PCN reaction causing immediate rash, facial/tongue/throat swelling, SOB or lightheadedness with hypotension: Yes Has patient had a PCN reaction causing severe rash involving mucus membranes or skin necrosis: No Has patient had a PCN reaction that required hospitalization No Has patient had a PCN reaction occurring within the last 10 years: No If all of the above answers are "NO", then  may proceed with Cephalosporin use.   . Ace Inhibitors Swelling      Past Medical History: Past Medical History:  Diagnosis Date  . Arthritis    knees, shoulder, upper back  . Asthma   . CHF (congestive heart failure) (HCC)   . Diabetes mellitus without complication (HCC)   . Environmental allergies   . Fibromyalgia   . GERD (gastroesophageal reflux disease)   . Headache    migraines - 5x/mo  . Hypertension   . Hypokalemia 10/14/2017  . Migraines   . Motion sickness    ships  . Sleep apnea   . Stroke [facility])    no residual deficits  . Thyroid nodule    bilateral and goiter  . Wears contact lenses   . Wears dentures    partial upper     Past Surgical History: Past Surgical History:  Procedure Laterality Date  . ABDOMINAL HYSTERECTOMY  2000's  . ABDOMINAL SURGERY     laparoscopy x4 with lysis of adhesions  . APPENDECTOMY  1986  . CESAREAN SECTION  1992  . CHOLECYSTECTOMY N/A 04/03/2017   Procedure: LAPAROSCOPIC CHOLECYSTECTOMY;  Surgeon: Henrene Dodge, MD;  Location: [facility] ORS;  Service: General;  Laterality: N/A;  . COLONOSCOPY WITH PROPOFOL N/A 03/10/2017   Procedure: COLONOSCOPY WITH PROPOFOL;  Surgeon: Scot Jun, MD;  Location: [facility] ENDOSCOPY;  Service: Endoscopy;  Laterality: N/A;  . ECTOPIC PREGNANCY SURGERY  2000's  . ESOPHAGOGASTRODUODENOSCOPY (EGD) WITH PROPOFOL N/A 03/10/2017   Procedure: ESOPHAGOGASTRODUODENOSCOPY (EGD) WITH PROPOFOL;  Surgeon: Scot Jun, MD;  Location: [facility] ENDOSCOPY;  Service: Endoscopy;  Laterality: N/A;  . HERNIA REPAIR    . JOINT REPLACEMENT Right 12/16/12   knee- medial - makoplasty  . KNEE ARTHROSCOPY Right 2006   partial medial and lateral meniscectomies  . KNEE ARTHROSCOPY Right 10/06/2015   Procedure: RIGHT KNEE ARTHROSCOPY WITH DEBRIDEMENT;  Surgeon: Erin Sons, MD;  Location: [facility];  Service: Orthopedics;  Laterality: Right;  Diabetic - insulin and oral meds  . ROTATOR CUFF REPAIR Left 2006  . TUBAL  LIGATION  2000's     Family History: Family History  Problem Relation Age of Onset  . Hypertension Mother   . Hypertension Father   . Diabetes Father   . Allergies Father   . Breast cancer Neg Hx      Social History: Social History   Socioeconomic History  . Marital status: Divorced    Spouse name: Not on file  . Number  of children: Not on file  . Years of education: Not on file  . Highest education level: Not on file  Occupational History  . Not on file  Social Needs  . Financial resource strain: Not on file  . Food insecurity:    Worry: Not on file    Inability: Not on file  . Transportation needs:    Medical: Not on file    Non-medical: Not on file  Tobacco Use  . Smoking status: Never Smoker  . Smokeless tobacco: Never Used  Substance and Sexual Activity  . Alcohol use: No  . Drug use: No  . Sexual activity: Yes    Birth control/protection: Surgical  Lifestyle  . Physical activity:    Days per week: Not on file    Minutes per session: Not on file  . Stress: Not on file  Relationships  . Social connections:    Talks on phone: Not on file    Gets together: Not on file    Attends religious service: Not on file    Active member of club or organization: Not on file    Attends meetings of clubs or organizations: Not on file    Relationship status: Not on file  . Intimate partner violence:    Fear of current or ex partner: Not on file    Emotionally abused: Not on file    Physically abused: Not on file    Forced sexual activity: Not on file  Other Topics Concern  . Not on file  Social History Narrative   Lives at home with son (who is mentally ill)   Ambulates independently.     Review of Systems: Review of Systems  Constitutional: Positive for malaise/fatigue. Negative for chills and fever.  HENT: Negative for hearing loss, nosebleeds and tinnitus.   Eyes: Negative for blurred vision and double vision.  Respiratory: Positive for shortness of  breath. Negative for cough, hemoptysis and sputum production.   Cardiovascular: Positive for orthopnea and leg swelling. Negative for chest pain and palpitations.  Gastrointestinal: Negative for heartburn, nausea and vomiting.  Genitourinary: Negative for dysuria, frequency and urgency.  Musculoskeletal: Positive for joint pain. Negative for myalgias.  Skin: Negative for itching and rash.  Neurological: Negative for dizziness and focal weakness.  Endo/Heme/Allergies: Negative for polydipsia. Does not bruise/bleed easily.  Psychiatric/Behavioral: Negative for depression. The patient is not nervous/anxious.      Vital Signs: Blood pressure (!) 91/45, pulse 61, temperature 97.8 F (36.6 C), temperature source Oral, resp. rate 18, height 5\' 5"  (1.651 m), weight 93.3 kg, SpO2 98 %.  Weight trends: Filed Weights   07/18/18 0425 07/19/18 0434  Weight: 92.6 kg 93.3 kg    Physical Exam: General: NAD, resting comfortably in bed  Head: Normocephalic, atraumatic.  Eyes: Anicteric, EOMI  Nose: Mucous membranes moist, not inflammed, nonerythematous.  Throat: Oropharynx nonerythematous, no exudate appreciated.   Neck: Supple, trachea midline.  Lungs:  Normal respiratory effort. Minimal basilar rales  Heart: RRR. S1 and S2 normal without gallop, murmur, or rubs.  Abdomen:  BS normoactive. Soft, Nondistended, non-tender.  No masses or organomegaly.  Extremities: trace pretibial edema.  Neurologic: A&O X3, Motor strength is 5/5 in the all 4 extremities  Skin: No visible rashes, scars.    Lab results: Basic Metabolic Panel: Recent Labs  Lab 07/17/18 1147 07/18/18 0528 07/19/18 0516  NA 139 135 135  K 3.8 4.1 4.3  CL 103 94* 93*  CO2 29 29 28   GLUCOSE 263*  396* 267*  BUN 15 28* 53*  CREATININE 0.97 1.86* 2.07*  CALCIUM 10.0 9.1 9.6    Liver Function Tests: No results for input(s): AST, ALT, ALKPHOS, BILITOT, PROT, ALBUMIN in the last 168 hours. No results for input(s): LIPASE,  AMYLASE in the last 168 hours. No results for input(s): AMMONIA in the last 168 hours.  CBC: Recent Labs  Lab 07/17/18 1147 07/18/18 0528  WBC 5.3 5.4  HGB 13.6 12.9  HCT 42.6 39.7  MCV 93.8 94.5  PLT 273 242    Cardiac Enzymes: Recent Labs  Lab 07/17/18 1147  TROPONINI <0.03    BNP: Invalid input(s): POCBNP  CBG: Recent Labs  Lab 07/18/18 0814 07/18/18 1145 07/18/18 1637 07/18/18 2038 07/19/18 1124  GLUCAP 293* 202* 180* 271* 257*    Microbiology: Results for orders placed or performed during the hospital encounter of 10/14/17  Culture, blood (routine x 2)     Status: None   Collection Time: 10/14/17 11:58 AM  Result Value Ref Range Status   Specimen Description BLOOD LEFT ANTECUBITAL  Final   Special Requests   Final    BOTTLES DRAWN AEROBIC AND ANAEROBIC Blood Culture adequate volume   Culture   Final    NO GROWTH 5 DAYS Performed at [facility], [address] [place], [place] [address]    Report Status 10/19/2017 FINAL  Final  Culture, blood (routine x 2)     Status: None   Collection Time: 10/14/17 11:58 AM  Result Value Ref Range Status   Specimen Description BLOOD LEFT ARM  Final   Special Requests   Final    BOTTLES DRAWN AEROBIC ONLY Blood Culture results may not be optimal due to an inadequate volume of blood received in culture bottles   Culture   Final    NO GROWTH 5 DAYS Performed at [facility], [address] [place], [place] [address]    Report Status 10/19/2017 FINAL  Final  CSF culture     Status: None   Collection Time: 10/15/17  2:14 PM  Result Value Ref Range Status   Specimen Description   Final    CSF Performed at [facility], [address] [place], [place] [address]    Special Requests   Final    NONE Performed at [facility], [address] [place], [place] [address]    Gram Stain   Final    NO ORGANISMS SEEN RARE WBC PRESENT, PREDOMINANTLY MONONUCLEAR    Culture   Final    NO GROWTH 3 DAYS Performed at [facility], [address] [place], [place] [address]    Report Status 10/19/2017 FINAL  Final    Coagulation Studies: No results for input(s): LABPROT, INR in the last 72 hours.  Urinalysis: No results for input(s): COLORURINE, LABSPEC, PHURINE, GLUCOSEU, HGBUR, BILIRUBINUR, KETONESUR, PROTEINUR, UROBILINOGEN, NITRITE, LEUKOCYTESUR in the last 72 hours.  Invalid input(s): APPERANCEUR    Imaging:  No results found.   Assessment & Plan: Pt is a 52 y.o. female  with a PMHx of osteoarthritis, asthma, chronic diastolic heart failure, diabetes mellitus type 2, fibromyalgia, GERD, hypertension, migraine headaches, obstructive sleep apnea, history of CVA, who was [facility] to [facility] on 07/17/2018 for evaluation of aggressive shortness of breath.    1.  Acute renal failure with normal Cr at baseline. 2.  Hypotension. 3.  Acute on chronic diastolic heart failure. 4.  Diabetes mellitus type 2 without complication.   Plan:  Acute renal failure now  secondary to diuresis and hypotension.  Agree with holding diuretics and losartan.  Will provide 0.9 normal saline at 40 cc/h for the next 24 hours and reevaluate renal function.  Check renal ultrasound to make sure there is no underlying obstruction.  Her blood pressure closely as well.  Avoid nephrotoxins as possible.  Otherwise work-up as per cardiology and hospitalist.

## 2018-07-20 LAB — BASIC METABOLIC PANEL
Anion gap: 11 (ref 5–15)
BUN: 51 mg/dL — ABNORMAL HIGH (ref 6–20)
CO2: 32 mmol/L (ref 22–32)
Calcium: 9.4 mg/dL (ref 8.9–10.3)
Chloride: 95 mmol/L — ABNORMAL LOW (ref 98–111)
Creatinine, Ser: 1.59 mg/dL — ABNORMAL HIGH (ref 0.44–1.00)
GFR calc Af Amer: 42 mL/min — ABNORMAL LOW (ref >60)
GFR calc non Af Amer: 36 mL/min — ABNORMAL LOW (ref >60)
Glucose, Bld: 201 mg/dL — ABNORMAL HIGH (ref 70–99)
Potassium: 3.3 mmol/L — ABNORMAL LOW (ref 3.5–5.1)
Sodium: 138 mmol/L (ref 135–145)

## 2018-07-20 LAB — GLUCOSE, CAPILLARY: Glucose-Capillary: 210 mg/dL — ABNORMAL HIGH (ref 70–99)

## 2018-07-20 MED ORDER — POTASSIUM CHLORIDE CRYS ER 20 MEQ PO TBCR
40.0000 meq | EXTENDED_RELEASE_TABLET | Freq: Once | ORAL | Status: AC
  Administered 2018-07-20: 40 meq via ORAL
  Filled 2018-07-20: qty 2

## 2018-07-20 MED ORDER — METOPROLOL SUCCINATE ER 25 MG PO TB24: 25.0000 mg | ORAL_TABLET | Freq: Every day | ORAL | 0 refills | Status: DC

## 2018-07-20 MED ORDER — FUROSEMIDE 20 MG PO TABS: 20.0000 mg | ORAL_TABLET | Freq: Every day | ORAL | 0 refills | Status: DC

## 2018-07-20 MED ORDER — LOSARTAN POTASSIUM 25 MG PO TABS: 25.0000 mg | ORAL_TABLET | Freq: Every day | ORAL | 0 refills | Status: DC

## 2018-07-20 NOTE — Progress Notes (Signed)
SUBJECTIVE: Pt remains short of breath.   Vitals:   07/19/18 0803 07/19/18 1654 07/20/18 0407 07/20/18 0757  BP: (!) 91/45 (!) 96/56 106/60 96/73  Pulse: 61 73 65 77  Resp: 18 18 18 18   Temp: 97.8 F (36.6 C) 98.2 F (36.8 C) 98.5 F (36.9 C) 98.8 F (37.1 C)  TempSrc: Oral Oral Oral Oral  SpO2: 98% 93% 97% 93%  Weight:   93.2 kg   Height:        Intake/Output Summary (Last 24 hours) at 07/20/2018 0852 Last data filed at 07/20/2018 0403 Gross per 24 hour  Intake 310.67 ml  Output 1650 ml  Net -1339.33 ml    LABS: Basic Metabolic Panel: Recent Labs    07/19/18 0516 07/20/18 0431  NA 135 138  K 4.3 3.3*  CL 93* 95*  CO2 28 32  GLUCOSE 267* 201*  BUN 53* 51*  CREATININE 2.07* 1.59*  CALCIUM 9.6 9.4   Liver Function Tests: No results for input(s): AST, ALT, ALKPHOS, BILITOT, PROT, ALBUMIN in the last 72 hours. No results for input(s): LIPASE, AMYLASE in the last 72 hours. CBC: Recent Labs    07/17/18 1147 07/18/18 0528  WBC 5.3 5.4  HGB 13.6 12.9  HCT 42.6 39.7  MCV 93.8 94.5  PLT 273 242   Cardiac Enzymes: Recent Labs    07/17/18 1147  TROPONINI <0.03   BNP: Invalid input(s): POCBNP D-Dimer: No results for input(s): DDIMER in the last 72 hours. Hemoglobin A1C: No results for input(s): HGBA1C in the last 72 hours. Fasting Lipid Panel: No results for input(s): CHOL, HDL, LDLCALC, TRIG, CHOLHDL, LDLDIRECT in the last 72 hours. Thyroid Function Tests: No results for input(s): TSH, T4TOTAL, T3FREE, THYROIDAB in the last 72 hours.  Invalid input(s): FREET3 Anemia Panel: No results for input(s): VITAMINB12, FOLATE, FERRITIN, TIBC, IRON, RETICCTPCT in the last 72 hours.   PHYSICAL EXAM General: Well developed, appears fatigued, distressed about continued shortness of breath HEENT:  Normocephalic and atramatic Neck:  No JVD.  Lungs: Clear bilaterally to auscultation and percussion. Heart: HRRR . Normal S1 and S2 without gallops or murmurs.   Abdomen: Bowel sounds are positive, abdomen soft and non-tender  Msk:  Back normal, normal gait. Normal strength and tone for age. Extremities: No clubbing, cyanosis or edema.   Neuro: Alert and oriented X 3. Psych:  Good affect, responds appropriately  TELEMETRY: NSR 92bpm  ASSESSMENT AND PLAN:  Shortness of breath: Congestive heart failure with preserved EF, shortness of breath and LE edema. AKI is improving, consider restarting mild diuretics. Shortness of breath is out of proportion to degree of diastolic CHF. Needs pulmonary consult. OSA may be factor, see below.   Obstructive sleep apnea: Pt was recently diagnosed with severe sleep apnea and completed the titration study. She is in the process of obtaining home CPAP treatment. I suspect some of her LE edema and fluid overload is secondary to untreated OSA.   Hypotension: Blood pressure is low normal, AKI is improving.     Active Problems:   Acute on chronic diastolic (congestive) heart failure (HCC)    Caroleen HammanKristin Etha Stambaugh, NP-C 07/20/2018 8:52 AM Cell: [phone]

## 2018-07-20 NOTE — Care Management Note (Addendum)
Case Management Note  Patient Details  Name: Amanda [name] MRN: [id number] Date of Birth: 1966-02-13  Subjective/Objective:    Patient is from home; lives with son.  When she gets sick she stays at her daughter's home.  Admitted with acute on chronic heart failure.  Patient does have a functioning scale at home.   Verbalizes understanding of importance of weighing daily.  Heart failure booklet at bedside.  Current with heart failure clinic and PCP.  Recently had a sleep study and in the process of getting a CPAP machine at home through [facility].  She does not drive; her daughter transports patient.  She is currently homebound and tires out very quickly with exertion or talking.  Offered choice for home health services and made referral to [facility] for RN, PT, aide.  Patient has a discharge order.   CM consult for medication needs.  Patient has medicare and medicaid. States she spends approximately $50/mo. On medications.  Her medications are no more than $3.00 each.  Suggested she contact DSS and apply for extra medicaid drug coverage if she feels its too much for her.   She stated that she would contact them    Action/Plan:   Expected Discharge Date:  07/20/18               Expected Discharge Plan:  Home w Home Health Services  In-House Referral:     Discharge planning Services  CM Consult  Post Acute Care Choice:  Home Health Choice offered to:  Patient  DME Arranged:    DME Agency:  [facility]  HH Arranged:  RN, PT, Nurse's Aide HH Agency:  [facility]  Status of Service:  Completed, signed off  If discussed at Long Length of Stay Meetings, dates discussed:    Additional Comments:  Amanda KernsJennifer L Kyle Luppino, RN 07/20/2018, 1:33 PM

## 2018-07-20 NOTE — Progress Notes (Signed)
Cardiovascular and Pulmonary Nurse Navigator Note:    52 year old female with PMHx of osteoarthritis, asthma, chronic diastolic heart failure, DM Type 2, fibromyalgia, GERD, HTN, migraine headaches, OSA, history of CVA who was admitted with SOB on 07/17/2018.    Active problem list during this admission:  1. Hypotension 2. Dyspnea 3. Acute Diastolic Heart Failure Exacerbation - Echo done on 07/05/2018 revealed EF of 60% with diastolic dysfunction.   4. AKI - Nephrology following patient 5. DM Type 2 6.  [facility]   CHF Education / Review:  ?? Educational session with patient / family completed.  Provided patient with "Living Better with Heart Failure" packet. Briefly reviewed definition of heart failure and signs and symptoms of an exacerbation.?Explained to patient that HF is a chronic illness which requires self-assessment / self-management along with help from the cardiologist/PCP.  NOTE:  HF is not a new diagnosis for this patient.   ? *Reviewed importance of and reason behind checking weight daily in the AM, after using the bathroom, but before getting dressed. Patient has scales and verbalized understanding of why she needs to weigh herself daily.   ? *Reviewed with patient the following information: *Discussed when to call the Dr= weight gain of >2-3lb overnight of 5lb in a week,  *Discussed yellow zone= call MD: weight gain of >2-3lb overnight of 5lb in a week, increased swelling, increased SOB when lying down, chest discomfort, dizziness, increased fatigue *Red Zone= call 911: struggle to breath, fainting or near fainting, significant chest pain   *Instructed patient to take medications as prescribed for heart failure. Explained briefly why pt is on the medications (either make you feel better, live longer or keep you out of the hospital) and discussed monitoring and side effects.  ? Low sodium diet - Discussed low sodium foods and foods to avoid.  Discussed reading food  labels. Instructions provided on 2000 mg low sodium diet.   *Discussed fluid intake with patient as well. Patient not currently on a fluid restriction, but advised no more than 8-8 ounces glass of fluids per day.?P atient informed this RN that she was instructed not to drink more than 60 ounces per day.   ? Smoking Cessation - Patient is a NEVER smoker.   ? Exercise - Encouraged patient to remain as active as possible.  RNCM has placed a referral to [facility] for RN, PT, aide.   ? [facility] - [facility].  Next appointment is scheduled for 08/06/2018 at 8:40 a.m.    Other appointments scheduled for patient include:   Dr. Letta PateAycock -Patient's PCP on 07/23/2018 at 9:20 a.m.   Dr. Adrian BlackwaterShaukat Khan, Cardiology at 07/27/2018 at 10:45 a.m.    [facility] [place] - Shared information with patient and family.  Brochure given.  Patient encouraged to attend.  Next Meeting on December 10 at 11:30 a.m. In meeting room of the Dining Room in the [facility].    Again, the 5 Steps to Living Better with Heart Failure were reviewed with patient.  Army Meliaiane Wright, RN, BSN, [facility]? [facility]  [facility]  Cardiovascular &?Pulmonary Nurse Navigator  Direct Line: [phone]  Department Phone #: [phone] Fax: [phone]? Email Address: [email or web address]    ?  ?  ?

## 2018-07-20 NOTE — Discharge Instructions (Signed)
Shortness of Breath, Adult Shortness of breath is when a person has trouble breathing enough air, or when a person feels like she or he is having trouble breathing in enough air. Shortness of breath could be a sign of medical problem. Follow these instructions at home: Pay attention to any changes in your symptoms. Take these actions to help with your condition:  Do not smoke. Smoking is a common cause of shortness of breath. If you smoke and you need help quitting, ask your health care provider.  Avoid things that can irritate your airways, such as: ? Mold. ? Dust. ? Air pollution. ? Chemical fumes. ? Things that can cause allergy symptoms (allergens), if you have allergies.  Keep your living space clean and free of mold and dust.  Rest as needed. Slowly return to your usual activities.  Take over-the-counter and prescription medicines, including oxygen and inhaled medicines, only as told by your health care provider.  Keep all follow-up visits as told by your health care provider. This is important.  Contact a health care provider if:  Your condition does not improve as soon as expected.  You have a hard time doing your normal activities, even after you rest.  You have new symptoms. Get help right away if:  Your shortness of breath gets worse.  You have shortness of breath when you are resting.  You feel light-headed or you faint.  You have a cough that is not controlled with medicines.  You cough up blood.  You have pain with breathing.  You have pain in your chest, arms, shoulders, or abdomen.  You have a fever.  You cannot walk up stairs or exercise the way that you normally do. This information is not intended to replace advice given to you by your health care provider. Make sure you discuss any questions you have with your health care provider. Document Released: 05/14/2001 Document Revised: 03/09/2016 Document Reviewed: 01/25/2016 Elsevier Interactive Patient  Education  2018 Elsevier Inc.  

## 2018-07-20 NOTE — Discharge Summary (Signed)
[facility] [place]   PATIENT NAME: Amanda [name]    MR#:  [id number]  DATE OF BIRTH:  09-22-1965  DATE OF ADMISSION:  07/17/2018 ADMITTING PHYSICIAN: Shaune Pollack, MD  DATE OF DISCHARGE: 07/30/2018  PRIMARY CARE PHYSICIAN: Emogene Morgan, MD   ADMISSION DIAGNOSIS:  SOB (shortness of breath) [R06.02] Acute diastolic heart failure exacerbation Type 2 diabetes mellitus Obstructive sleep apnea DISCHARGE DIAGNOSIS:  Active Problems:   Acute on chronic diastolic (congestive) heart failure (HCC) Acute kidney injury Obstructive sleep apnea Type 2 diabetes mellitus  SECONDARY DIAGNOSIS:   Past Medical History:  Diagnosis Date  . Arthritis    knees, shoulder, upper back  . Asthma   . CHF (congestive heart failure) (HCC)   . Diabetes mellitus without complication (HCC)   . Environmental allergies   . Fibromyalgia   . GERD (gastroesophageal reflux disease)   . Headache    migraines - 5x/mo  . Hypertension   . Hypokalemia 10/14/2017  . Migraines   . Motion sickness    ships  . Sleep apnea   . Stroke [facility])    no residual deficits  . Thyroid nodule    bilateral and goiter  . Wears contact lenses   . Wears dentures    partial upper     ADMITTING HISTORY Amanda [name]  is a 52 y.o. female with a known history of multiple medical problems as below.  The patient has had worsening shortness of breath and gaining weight 18 pound for the past 3 weeks.  She has been treated with Lasix p.o. and get IV in the heart failure center without improvement.  She also complains of chest discomfort, leg swelling, orthopnea and nocturnal dyspnea.  She is sent from heart failure scented to ED for further evaluation.  Dr. Alphonzo Lemmings requested admission for acute on chronic CHF.  HOSPITAL COURSE:  Patient was admitted to telemetry.  Patient was evaluated by cardiology.  Patient was aggressively diuresed with IV Lasix along with metolazone.  Fluid restriction was  implemented.  Daily body weights were checked along with input output chart.  Blood pressure was controlled with medical management.  Patient diuresed well but her kidney functions worsened.  During the course of hospital stay her diuretics were held.  Nephro consultation was also done and patient was worked up with renal ultrasound which was normal study.  Once diuretics were held renal function improved.  Troponin was negative during the work-up.  Patient will be discharged home on low-dose Lasix and will follow-up with cardiology in the clinic.  CONSULTS OBTAINED:  Treatment Team:  Laurier Nancy, MD Mady Haagensen, MD  DRUG ALLERGIES:   Allergies  Allergen Reactions  . Penicillins Anaphylaxis    Has patient had a PCN reaction causing immediate rash, facial/tongue/throat swelling, SOB or lightheadedness with hypotension: Yes Has patient had a PCN reaction causing severe rash involving mucus membranes or skin necrosis: No Has patient had a PCN reaction that required hospitalization No Has patient had a PCN reaction occurring within the last 10 years: No If all of the above answers are "NO", then may proceed with Cephalosporin use.   . Ace Inhibitors Swelling    DISCHARGE MEDICATIONS:   Allergies as of 07/20/2018      Reactions   Penicillins Anaphylaxis   Has patient had a PCN reaction causing immediate rash, facial/tongue/throat swelling, SOB or lightheadedness with hypotension: Yes Has patient had a PCN reaction causing severe rash involving mucus membranes or  skin necrosis: No Has patient had a PCN reaction that required hospitalization No Has patient had a PCN reaction occurring within the last 10 years: No If all of the above answers are "NO", then may proceed with Cephalosporin use.   Ace Inhibitors Swelling      Medication List    STOP taking these medications   aspirin EC 81 MG tablet   meloxicam 7.5 MG tablet Commonly known as:  MOBIC   metFORMIN 1000 MG  tablet Commonly known as:  GLUCOPHAGE   Potassium Chloride ER 20 MEQ Tbcr     TAKE these medications   albuterol 108 (90 Base) MCG/ACT inhaler Commonly known as:  PROVENTIL HFA;VENTOLIN HFA Inhale 2 puffs into the lungs every 4 (four) hours as needed for wheezing or shortness of breath.   ARIPiprazole 2 MG tablet Commonly known as:  ABILIFY Take 2 mg by mouth daily.   BREO ELLIPTA 200-25 MCG/INH Aepb Generic drug:  fluticasone furoate-vilanterol Inhale 1 puff into the lungs daily. What changed:  See the new instructions.   clonazePAM 0.5 MG tablet Commonly known as:  KLONOPIN Take 0.5 mg by mouth 2 (two) times daily.   DULoxetine 60 MG capsule Commonly known as:  CYMBALTA Take 60 mg by mouth 2 (two) times daily.   flunisolide 25 MCG/ACT (0.025%) Soln Commonly known as:  NASALIDE Place 2 sprays into the nose daily as needed.   furosemide 20 MG tablet Commonly known as:  LASIX Take 1 tablet (20 mg total) by mouth daily. What changed:    medication strength  how much to take  when to take this   gabapentin 300 MG capsule Commonly known as:  NEURONTIN Take 600 mg by mouth 2 (two) times daily.   HYDROcodone-acetaminophen 5-325 MG tablet Commonly known as:  NORCO/VICODIN Take 1-2 tablets by mouth at bedtime as needed for moderate pain.   insulin detemir 100 UNIT/ML injection Commonly known as:  LEVEMIR Inject 12-20 Units into the skin at bedtime.   INVOKANA 300 MG Tabs tablet Generic drug:  canagliflozin Take 300 mg by mouth daily before breakfast.   ipratropium-albuterol 0.5-2.5 (3) MG/3ML Soln Commonly known as:  DUONEB Inhale contents of 1 vial via nebulizer 3 times daily as needed for shortness of breath/wheeze/cough. What changed:  See the new instructions.   isosorbide mononitrate 30 MG 24 hr tablet Commonly known as:  IMDUR Take 1 tablet (30 mg total) by mouth daily.   lidocaine 5 % Commonly known as:  LIDODERM Place 1 patch onto the skin daily.  (remove after 12 hours and apply new patch 12 hours later)   LINZESS 290 MCG Caps capsule Generic drug:  linaclotide Take 1 tablet by mouth daily before breakfast.   losartan 25 MG tablet Commonly known as:  COZAAR Take 1 tablet (25 mg total) by mouth daily. What changed:    medication strength  how much to take   metolazone 2.5 MG tablet Commonly known as:  ZAROXOLYN Take 2.5 mg by mouth daily.   metoprolol succinate 25 MG 24 hr tablet Commonly known as:  TOPROL-XL Take 1 tablet (25 mg total) by mouth daily. Take with or immediately following a meal. What changed:    medication strength  how much to take   mirtazapine 30 MG disintegrating tablet Commonly known as:  REMERON SOL-TAB Take 30 mg by mouth daily.   montelukast 10 MG tablet Commonly known as:  SINGULAIR Take 1 tablet (10 mg total) by mouth daily.   multivitamin tablet  Take 1 tablet by mouth daily.   omeprazole 40 MG capsule Commonly known as:  PRILOSEC Take 1 capsule (40 mg total) by mouth daily. 1 hr before supper   pravastatin 80 MG tablet Commonly known as:  PRAVACHOL Take 1 tablet (80 mg total) by mouth daily. What changed:  how much to take   sitaGLIPtin 100 MG tablet Commonly known as:  JANUVIA Take 100 mg by mouth daily.   sucralfate 1 g tablet Commonly known as:  CARAFATE Take 1 g by mouth 2 (two) times daily.   SUMAtriptan 25 MG tablet Commonly known as:  IMITREX Take 25 mg by mouth every 2 (two) hours as needed for migraine. May repeat in 2 hours if headache persists or recurs.   tiotropium 18 MCG inhalation capsule Commonly known as:  SPIRIVA Place 1 capsule (18 mcg total) into inhaler and inhale daily.   tiZANidine 4 MG tablet Commonly known as:  ZANAFLEX Take 4 mg by mouth every 8 (eight) hours as needed for muscle spasms.   traMADol 50 MG tablet Commonly known as:  ULTRAM Take 50 mg by mouth every 6 (six) hours as needed for moderate pain.   vitamin C 1000 MG  tablet Take 500 mg by mouth 2 (two) times daily.   zolpidem 10 MG tablet Commonly known as:  AMBIEN Take 10 mg by mouth at bedtime as needed for sleep.       Today  Patient seen and evaluated today Decreased shortness of breath No chest pain Weaned off oxygen  VITAL SIGNS:  Blood pressure 96/73, pulse 77, temperature 98.8 F (37.1 C), temperature source Oral, resp. rate 18, height 5\' 5"  (1.651 m), weight 93.2 kg, SpO2 93 %.  I/O:    Intake/Output Summary (Last 24 hours) at 07/20/2018 1317 Last data filed at 07/20/2018 0403 Gross per 24 hour  Intake 310.67 ml  Output 1650 ml  Net -1339.33 ml    PHYSICAL EXAMINATION:  Physical Exam  GENERAL:  52 y.o.-year-old patient lying in the bed with no acute distress.  LUNGS: Normal breath sounds bilaterally, no wheezing, rales,rhonchi or crepitation. No use of accessory muscles of respiration.  CARDIOVASCULAR: S1, S2 normal. No murmurs, rubs, or gallops.  ABDOMEN: Soft, non-tender, non-distended. Bowel sounds present. No organomegaly or mass.  NEUROLOGIC: Moves all 4 extremities. PSYCHIATRIC: The patient is alert and oriented x 3.  SKIN: No obvious rash, lesion, or ulcer.   DATA REVIEW:   CBC Recent Labs  Lab 07/18/18 0528  WBC 5.4  HGB 12.9  HCT 39.7  PLT 242    Chemistries  Recent Labs  Lab 07/20/18 0431  NA 138  K 3.3*  CL 95*  CO2 32  GLUCOSE 201*  BUN 51*  CREATININE 1.59*  CALCIUM 9.4    Cardiac Enzymes Recent Labs  Lab 07/17/18 1147  TROPONINI <0.03    Microbiology Results  Results for orders placed or performed during the hospital encounter of 10/14/17  Culture, blood (routine x 2)     Status: None   Collection Time: 10/14/17 11:58 AM  Result Value Ref Range Status   Specimen Description BLOOD LEFT ANTECUBITAL  Final   Special Requests   Final    BOTTLES DRAWN AEROBIC AND ANAEROBIC Blood Culture adequate volume   Culture   Final    NO GROWTH 5 DAYS Performed at [facility], [address] [place], [place] [address]    Report Status 10/19/2017 FINAL  Final  Culture, blood (routine x 2)  Status: None   Collection Time: 10/14/17 11:58 AM  Result Value Ref Range Status   Specimen Description BLOOD LEFT ARM  Final   Special Requests   Final    BOTTLES DRAWN AEROBIC ONLY Blood Culture results may not be optimal due to an inadequate volume of blood received in culture bottles   Culture   Final    NO GROWTH 5 DAYS Performed at [facility], [address] [place], [place] [address]    Report Status 10/19/2017 FINAL  Final  CSF culture     Status: None   Collection Time: 10/15/17  2:14 PM  Result Value Ref Range Status   Specimen Description   Final    CSF Performed at [facility], [address] [place], [place] [address]    Special Requests   Final    NONE Performed at [facility], [address] [place], [place] [address]    Gram Stain   Final    NO ORGANISMS SEEN RARE WBC PRESENT, PREDOMINANTLY MONONUCLEAR    Culture   Final    NO GROWTH 3 DAYS Performed at [facility], [address] [place], [place] [address]    Report Status 10/19/2017 FINAL  Final    RADIOLOGY:  [place] Renal  Result Date: 07/19/2018 CLINICAL DATA:  Acute renal failure EXAM: RENAL / URINARY TRACT ULTRASOUND COMPLETE COMPARISON:  Ultrasound 04/02/2017.  CT 10/24/2016. FINDINGS: Right Kidney: Renal measurements: 11.0 x 4.5 x 5.1 cm = volume: 131 mL . Echogenicity within normal limits. No mass or hydronephrosis visualized. Left Kidney: Renal measurements: 10.1 x 6.0 x 5.4 cm = volume: 169 mL. Echogenicity within normal limits. No mass or hydronephrosis visualized. Bladder: Appears normal for degree of bladder distention. Incidentally noted is fatty infiltration of the liver. IMPRESSION: No acute renal abnormality.  No hydronephrosis. Fatty liver. Electronically Signed   By: Charlett NoseKevin  Dover M.D.   On: 07/19/2018 18:21    Follow up with PCP in 1 week.  Management plans discussed with  the patient, family and they are in agreement.  CODE STATUS: Full code    Code Status Orders  (From admission, onward)         Start     Ordered   07/17/18 1715  Full code  Continuous     07/17/18 1714        Code Status History    Date Active Date Inactive Code Status Order ID Comments User Context   03/28/2018 2134 03/30/2018 1557 Full Code [id number]  Enid BaasKalisetti, Radhika, MD Inpatient   10/14/2017 1002 10/17/2017 1341 Full Code [id number]  Eddie Northhungel, Nishant, MD Inpatient   06/03/2017 1631 06/06/2017 1556 Full Code [id number]  Adrian SaranMody, Sital, MD Inpatient   04/02/2017 1505 04/05/2017 1912 Full Code [id number]  Henrene DodgePiscoya, Jose, MD Inpatient   03/18/2016 1553 03/20/2016 1748 Full Code [id number]  Katha HammingKonidena, Snehalatha, MD ED      TOTAL TIME TAKING CARE OF THIS PATIENT ON DAY OF DISCHARGE: more than 34 minutes.   Ihor AustinPavan Pyreddy M.D on 07/20/2018 at 1:17 PM  Between 7am to 6pm - Pager - [phone]  After 6pm go to [email or web address] - password [email or web address] [facility]  [facility] [place] [facility]  Office  [phone]  CC: Primary care physician; Emogene MorganAycock, Ngwe A, MD  Note: This dictation was prepared with Dragon dictation along with smaller phrase technology. Any transcriptional errors that result from this process are unintentional.

## 2018-07-20 NOTE — Progress Notes (Signed)
Inpatient Diabetes Program Recommendations  AACE/ADA: New Consensus Statement on Inpatient Glycemic Control (2015)  Target Ranges:  Prepandial:   less than 140 mg/dL      Peak postprandial:   less than 180 mg/dL (1-2 hours)      Critically ill patients:  140 - 180 mg/dL   Results for Amanda [name], Amanda [name] (MRN [id number]) as of 07/20/2018 09:49  Ref. Range 07/19/2018 08:00 07/19/2018 11:24 07/19/2018 16:43 07/19/2018 21:33  Glucose-Capillary Latest Ref Range: 70 - 99 mg/dL 782213 (H)  3 units NOVOLOG  257 (H)  5 units NOVOLOG  201 (H)  3 units NOVOLOG  174 (H)    12 units LEVEMIR   Results for Amanda [name], Amanda [name] (MRN [id number]) as of 07/20/2018 09:49  Ref. Range 07/20/2018 07:55  Glucose-Capillary Latest Ref Range: 70 - 99 mg/dL 578210 (H)  3 units NOVOLOG      Admit with: SOB/ CHF/ HTN Urgency  History: DM, CHF  Home DM Meds: Levemir 12-20 units QHS       Invokana 300 mg Daily       Metformin 1000 mg BID       Januvia 100 mg Daily  Current Orders: Levemir 12 units QHS      Novolog Sensitive Correction Scale/ SSI (0-9 units) TID AC + HS     MD- Please consider the following in-hospital insulin adjustments:  1. Increase Levemir to 15 units QHS (~20% increase)  2. Start low dose Novolog Meal Coverage: Novolog 3 units TID with meals  (Please add the following Hold Parameters: Hold if pt eats <50% of meal, Hold if pt NPO)     --Will follow patient during hospitalization--  Ambrose FinlandJeannine Johnston Anaily Ashbaugh RN, MSN, CDE Diabetes Coordinator Inpatient Glycemic Control Team Team Pager: [phone] (8a-5p)

## 2018-07-20 NOTE — Care Management Important Message (Signed)
Copy of signed IM left with patient in room.  

## 2018-07-21 ENCOUNTER — Encounter: Payer: Self-pay | Admitting: Internal Medicine

## 2018-07-21 ENCOUNTER — Other Ambulatory Visit: Payer: Self-pay | Admitting: Internal Medicine

## 2018-07-21 ENCOUNTER — Ambulatory Visit (INDEPENDENT_AMBULATORY_CARE_PROVIDER_SITE_OTHER): Payer: 59 | Admitting: Internal Medicine

## 2018-07-21 VITALS — BP 106/76 | HR 119 | Ht 65.0 in

## 2018-07-21 DIAGNOSIS — R0600 Dyspnea, unspecified: Secondary | ICD-10-CM

## 2018-07-21 DIAGNOSIS — G4733 Obstructive sleep apnea (adult) (pediatric): Secondary | ICD-10-CM | POA: Diagnosis not present

## 2018-07-21 DIAGNOSIS — J4540 Moderate persistent asthma, uncomplicated: Secondary | ICD-10-CM | POA: Diagnosis not present

## 2018-07-21 DIAGNOSIS — K219 Gastro-esophageal reflux disease without esophagitis: Secondary | ICD-10-CM

## 2018-07-21 MED ORDER — ALBUTEROL SULFATE HFA 108 (90 BASE) MCG/ACT IN AERS: 2.0000 | INHALATION_SPRAY | RESPIRATORY_TRACT | 1 refills | Status: DC | PRN

## 2018-07-21 NOTE — Patient Instructions (Signed)
Continue your current inhaler medications.  Will need to get results of your recent sleep study. Use cpap nightly once you get it.

## 2018-07-21 NOTE — Progress Notes (Addendum)
[facility] [place] [facility]     Assessment and Plan:  52 yo female with persistent asthma and frequent exacerbations.   Asthma. --Severe persistent asthma. --Continue Breo, Singulair, DuoNeb, albuterol as needed. -RAST test 04/11/2016 total IgE 9.  Mild allergy to cockroach.   Dyspnea. --Multifactorial from asthma, congestive heart failure with pulmonary edema, obesity, deconditioning. - Continue rehab.  OSA --She has recently undergone a new sleep study via [facility]. --We will need to obtain results of most recent sleep study from [facility], patient is recommended to start on CPAP as soon as possible.  GERD. -Continue omeprazole daily. Meds ordered this encounter  Medications  . albuterol (PROVENTIL HFA) 108 (90 Base) MCG/ACT inhaler    Sig: Inhale 2 puffs into the lungs every 4 (four) hours as needed for wheezing or shortness of breath.    Dispense:  3 Inhaler    Refill:  1     Return in about 3 months (around 10/21/2018).  Greater than 50% of the 25 minute visit was spent in counseling/coordination of care regarding multiple above issues.   Addendum 07/28/2018: Review outside sleep study 07/09/2018, CPAP titration study, patient required CPAP at a pressure of 10.  Residual AHI 0.  Date: 07/21/2018  MRN# [id number] Amanda [name] [birth year]    Amanda [name] is a 52 y.o. old female seen in consultation for chief complaint of:    Chief Complaint  Patient presents with  . Hospitalization Follow-up    just got out yesterday  . Shortness of Breath    all the time  . Chest Pain    HPI:  The patient is a 52 year old female with a history of asthma, hypertension, diabetes mellitus, GERD, fibromyalgia, obstructive sleep apnea, history of TIA.  She also was noted to have severe persistent asthma.  At last visit she was asked to continue Breo, Spiriva Singulair, she was asked to restart CPAP.  She was recently admitted to the hospital from 07/17/2018  for 13 days for acute on chronic congestive heart failure and was just discharged yesterday.  She was diuresed aggressively, suffered from acute kidney injury, managed medically.  She has apparently undergone Rast testing in the past. She is currently on Breo, spiriva, duoneb, albuterol.  Currently she appears tired, fatigued. She is taking her inhaled medications as directed. She was using CPAP in the hospital but was not using it otherwise, she no longer has a CPAP at home. She had a sleep study via [facility] last week and is was told that she had severe OSA, she is waiting to start on CPAP.    **CBC with differential 04/02/17>> Eosinophil equals 0 **Oxygen desaturation walk 06/16/2017>>beginning O2 sat on RA at rest. After walking 600 feet her sat was normal and HR 115. Moderate dyspnea but recovered quickly.   CT chest images from 03/30/18>> and compared with previous from 05/21/16>> images personally reviewed; mild interstitial edema, reduced lung volumes, bibasilar atelectasis.  Chest x-ray 03/20/16 and CT chest images from 01/04/16: Bibasilar subpleural fine interstitial changes, some mild bibasilar bronchiectasis changes. Cardiomegaly> Finding c/w CHF vs early pulmonary fibrosis.   **Echo 03/29/18>>EF 55%  Summary of outside records: Patient has a history of diabetes mellitus, hypertension, sleep apnea, asthma, multiple episodes of pneumonia, review of CMP from 03/26/16, normal. She has been having trouble with wheezing and has been treated for asthma exacerbation and pneumonia. [facility] and was told it was consistent with heart failure.  CBC 04/11/16: Eos  0.1 RAST: Pending.  PFT 05/23/16: Spirometry shows restriction without reversal with bronchodilatory therapy.   Medication:    Current Outpatient Medications:  .  albuterol (PROVENTIL HFA) 108 (90 Base) MCG/ACT inhaler, Inhale 2 puffs into the lungs every 4 (four) hours as needed for wheezing or shortness of breath., Disp: 3  Inhaler, Rfl: 1 .  ARIPiprazole (ABILIFY) 2 MG tablet, Take 2 mg by mouth daily. , Disp: , Rfl:  .  Ascorbic Acid (VITAMIN C) 1000 MG tablet, Take 500 mg by mouth 2 (two) times daily. , Disp: , Rfl:  .  BREO ELLIPTA 200-25 MCG/INH AEPB, Inhale 1 puff into the lungs daily. (Patient taking differently: Inhale 1 puff into the lungs daily. ), Disp: 60 each, Rfl: 4 .  canagliflozin (INVOKANA) 300 MG TABS tablet, Take 300 mg by mouth daily before breakfast., Disp: , Rfl:  .  clonazePAM (KLONOPIN) 0.5 MG tablet, Take 0.5 mg by mouth 2 (two) times daily., Disp: , Rfl:  .  DULoxetine (CYMBALTA) 60 MG capsule, Take 60 mg by mouth 2 (two) times daily., Disp: , Rfl:  .  flunisolide (NASALIDE) 25 MCG/ACT (0.025%) SOLN, Place 2 sprays into the nose daily as needed., Disp: , Rfl:  .  furosemide (LASIX) 20 MG tablet, Take 1 tablet (20 mg total) by mouth daily., Disp: 30 tablet, Rfl: 0 .  gabapentin (NEURONTIN) 300 MG capsule, Take 600 mg by mouth 2 (two) times daily., Disp: , Rfl:  .  HYDROcodone-acetaminophen (NORCO/VICODIN) 5-325 MG tablet, Take 1-2 tablets by mouth at bedtime as needed for moderate pain., Disp: , Rfl:  .  insulin detemir (LEVEMIR) 100 UNIT/ML injection, Inject 12-20 Units into the skin at bedtime. , Disp: , Rfl:  .  ipratropium-albuterol (DUONEB) 0.5-2.5 (3) MG/3ML SOLN, Inhale contents of 1 vial via nebulizer 3 times daily as needed for shortness of breath/wheeze/cough. (Patient taking differently: Inhale 3 mLs into the lungs 3 (three) times daily as needed (shortness of breath, wheezing or cough). ), Disp: 360 mL, Rfl: 3 .  isosorbide mononitrate (IMDUR) 30 MG 24 hr tablet, Take 1 tablet (30 mg total) by mouth daily., Disp: , Rfl:  .  lidocaine (LIDODERM) 5 %, Place 1 patch onto the skin daily. (remove after 12 hours and apply new patch 12 hours later), Disp: , Rfl: 1 .  LINZESS 290 MCG CAPS capsule, Take 1 tablet by mouth daily before breakfast. , Disp: , Rfl:  .  losartan (COZAAR) 25 MG  tablet, Take 1 tablet (25 mg total) by mouth daily., Disp: 30 tablet, Rfl: 0 .  metoprolol succinate (TOPROL-XL) 25 MG 24 hr tablet, Take 1 tablet (25 mg total) by mouth daily. Take with or immediately following a meal., Disp: 30 tablet, Rfl: 0 .  mirtazapine (REMERON SOL-TAB) 30 MG disintegrating tablet, Take 30 mg by mouth daily. , Disp: , Rfl: 2 .  montelukast (SINGULAIR) 10 MG tablet, Take 1 tablet (10 mg total) by mouth daily., Disp: 30 tablet, Rfl: 1 .  Multiple Vitamin (MULTIVITAMIN) tablet, Take 1 tablet by mouth daily., Disp: , Rfl:  .  omeprazole (PRILOSEC) 40 MG capsule, Take 1 capsule (40 mg total) by mouth daily. 1 hr before supper, Disp: 30 capsule, Rfl: 0 .  pravastatin (PRAVACHOL) 80 MG tablet, Take 1 tablet (80 mg total) by mouth daily. (Patient taking differently: Take 40 mg by mouth daily. ), Disp: 30 tablet, Rfl: 0 .  sitaGLIPtin (JANUVIA) 100 MG tablet, Take 100 mg by mouth daily., Disp: , Rfl:  .  sucralfate (CARAFATE) 1 g tablet, Take 1 g by mouth 2 (two) times daily. , Disp: , Rfl:  .  SUMAtriptan (IMITREX) 25 MG tablet, Take 25 mg by mouth every 2 (two) hours as needed for migraine. May repeat in 2 hours if headache persists or recurs., Disp: , Rfl:  .  tiotropium (SPIRIVA HANDIHALER) 18 MCG inhalation capsule, Place 1 capsule (18 mcg total) into inhaler and inhale daily., Disp: 90 capsule, Rfl: 2 .  tiZANidine (ZANAFLEX) 4 MG tablet, Take 4 mg by mouth every 8 (eight) hours as needed for muscle spasms. , Disp: , Rfl:  .  traMADol (ULTRAM) 50 MG tablet, Take 50 mg by mouth every 6 (six) hours as needed for moderate pain., Disp: , Rfl:  .  zolpidem (AMBIEN) 10 MG tablet, Take 10 mg by mouth at bedtime as needed for sleep. , Disp: , Rfl:  .  metolazone (ZAROXOLYN) 2.5 MG tablet, Take 2.5 mg by mouth daily., Disp: , Rfl:     Allergies:  Penicillins and Ace inhibitors  Review of Systems:  Constitutional: Feels well. Cardiovascular: Denies chest pain, exertional chest pain.   Pulmonary: Denies hemoptysis, pleuritic chest pain.   The remainder of systems were reviewed and were found to be negative other than what is documented in the HPI.    Physical Examination:   VS: BP 106/76 (BP Location: Left Arm, Cuff Size: Normal)   Pulse (!) 119   Ht 5\' 5"  (1.651 m)   SpO2 97%   BMI 34.18 kg/m   General Appearance: No distress appears very fatigued, tired. Neuro:without focal findings, mental status, speech normal, alert and oriented HEENT: PERRLA, EOM intact Pulmonary: Poor respiratory effort, scattered bibasilar crackles. CardiovascularNormal S1,S2.  No m/r/g.  Abdomen: Benign, Soft, non-tender, No masses Renal:  No costovertebral tenderness  GU:  No performed at this time. Endoc: No evident thyromegaly, no signs of acromegaly or Cushing features Skin:   warm, no rashes, no ecchymosis  Extremities: normal, no cyanosis, clubbing.      LABORATORY PANEL:   CBC Recent Labs  Lab 07/18/18 0528  WBC 5.4  HGB 12.9  HCT 39.7  PLT 242   ------------------------------------------------------------------------------------------------------------------  Chemistries  Recent Labs  Lab 07/20/18 0431  NA 138  K 3.3*  CL 95*  CO2 32  GLUCOSE 201*  BUN 51*  CREATININE 1.59*  CALCIUM 9.4   ------------------------------------------------------------------------------------------------------------------  Cardiac Enzymes Recent Labs  Lab 07/17/18 1147  TROPONINI <0.03   ------------------------------------------------------------  RADIOLOGY:  US Renal  Result Date: 07/19/2018 CLINICAL DATA:  Acute renal failure EXAM: RENAL / URINARY TRACT ULTRASOUND COMPLETE COMPARISON:  Ultrasound 04/02/2017.  CT 10/24/2016. FINDINGS: Right Kidney: Renal measurements: 11.0 x 4.5 x 5.1 cm = volume: 131 mL . Echogenicity within normal limits. No mass or hydronephrosis visualized. Left Kidney: Renal measurements: 10.1 x 6.0 x 5.4 cm = volume: 169 mL. Echogenicity  within normal limits. No mass or hydronephrosis visualized. Bladder: Appears normal for degree of bladder distention. Incidentally noted is fatty infiltration of the liver. IMPRESSION: No acute renal abnormality.  No hydronephrosis. Fatty liver. Electronically Signed   By: Charlett Nose M.D.   On: 07/19/2018 18:21       Thank  you for the consultation and for allowing [facility] to assist in the care of your patient. Our recommendations are noted above.  Please contact us if we can be of further service.   Wells Guiles, M.D., F.C.C.P.  Board Certified in Internal Medicine, Pulmonary Medicine,  Critical Care Medicine, and Sleep Medicine.  [place] [facility] Office Number: [phone]   07/21/2018

## 2018-07-28 ENCOUNTER — Telehealth: Payer: Self-pay | Admitting: *Deleted

## 2018-07-28 DIAGNOSIS — G4733 Obstructive sleep apnea (adult) (pediatric): Secondary | ICD-10-CM

## 2018-07-28 NOTE — Telephone Encounter (Signed)
Pt aware of results and orders placed. Nothing further needed.

## 2018-08-05 ENCOUNTER — Telehealth: Payer: Self-pay | Admitting: Internal Medicine

## 2018-08-05 DIAGNOSIS — J4521 Mild intermittent asthma with (acute) exacerbation: Secondary | ICD-10-CM

## 2018-08-05 NOTE — Telephone Encounter (Signed)
DME referral entered for nebulizer. Nothing further needed.

## 2018-08-05 NOTE — Telephone Encounter (Signed)
Patient wants a nebulizer .  Please call to discuss

## 2018-08-06 ENCOUNTER — Ambulatory Visit: Payer: 59 | Admitting: Family

## 2018-08-11 NOTE — Telephone Encounter (Signed)
[employer] faxed form over for pt. Due to pt's secondary insurance, this form must be received before equipment can be issued. Form is in provider's folder to sign. Once signed, please fax back to [employer] and then they can proceed with set up. Rhonda J Cobb

## 2018-08-11 NOTE — Telephone Encounter (Signed)
Patient checking on status of cpap hasn't heard anything .  Please call.

## 2018-08-14 ENCOUNTER — Ambulatory Visit: Payer: 59 | Admitting: Family

## 2018-08-19 ENCOUNTER — Telehealth: Payer: Self-pay | Admitting: Family

## 2018-08-19 NOTE — Telephone Encounter (Signed)
Patient called to say that she has gained 6 pounds since she was discharged from the hospital on 07/20/18 and she feels a little more short of breath as well. She is currently taking 40mg  furosemide BID and wanted to know if she could increase that to 3 tablets daily  Advised patient that she could take an additional 40mg  today and tomorrow only. She has an appointment with her cardiologist [name]([place]) on 08/27/18.

## 2018-09-03 ENCOUNTER — Ambulatory Visit: Payer: 59 | Admitting: Family

## 2018-09-10 ENCOUNTER — Ambulatory Visit: Payer: 59 | Admitting: Family

## 2018-09-10 ENCOUNTER — Telehealth: Payer: Self-pay | Admitting: Family

## 2018-09-10 NOTE — Progress Notes (Deleted)
Patient ID: Amanda [name], female    DOB: September 09, 1965, 53 y.o.   MRN: [id number]  HPI  Amanda [name] is a 53 y/o female with a history of asthma, DM, HTN, stroke, thyroid disease, GERD, fibromyalgia, migraines, obstructive sleep apnea and chronic heart failure.   Echo report from 03/29/18 reviewed and showed an EF of 55-60%.  Admitted 07/17/18 due to acute on chronic HF. Cardiology and nephrology consults obtained. Initially needed IV lasix and then transitioned to oral diuretics. Discharged after 3 days. Admitted 03/28/18 due to chest pain. Cardiology consult obtained. CXR and lab work were negative. No evidence of PE with a CT. Discharged after 2 days.   She presents today for a follow-up visit with a chief complaint of    Past Medical History:  Diagnosis Date  . Arthritis    knees, shoulder, upper back  . Asthma   . CHF (congestive heart failure) (HCC)   . Diabetes mellitus without complication (HCC)   . Environmental allergies   . Fibromyalgia   . GERD (gastroesophageal reflux disease)   . Headache    migraines - 5x/mo  . Hypertension   . Hypokalemia 10/14/2017  . Migraines   . Motion sickness    ships  . Sleep apnea   . Stroke [facility](HCC)    no residual deficits  . Thyroid nodule    bilateral and goiter  . Wears contact lenses   . Wears dentures    partial upper   Past Surgical History:  Procedure Laterality Date  . ABDOMINAL HYSTERECTOMY  2000's  . ABDOMINAL SURGERY     laparoscopy x4 with lysis of adhesions  . APPENDECTOMY  1986  . CESAREAN SECTION  1992  . CHOLECYSTECTOMY N/A 04/03/2017   Procedure: LAPAROSCOPIC CHOLECYSTECTOMY;  Surgeon: Henrene DodgePiscoya, Jose, MD;  Location: [facility] ORS;  Service: General;  Laterality: N/A;  . COLONOSCOPY WITH PROPOFOL N/A 03/10/2017   Procedure: COLONOSCOPY WITH PROPOFOL;  Surgeon: Scot JunElliott, Robert T, MD;  [facility];  Service: Endoscopy;  Laterality: N/A;  . ECTOPIC PREGNANCY SURGERY  2000's  . ESOPHAGOGASTRODUODENOSCOPY (EGD) WITH  PROPOFOL N/A 03/10/2017   Procedure: ESOPHAGOGASTRODUODENOSCOPY (EGD) WITH PROPOFOL;  Surgeon: Scot JunElliott, Robert T, MD;  [facility];  Service: Endoscopy;  Laterality: N/A;  . HERNIA REPAIR    . JOINT REPLACEMENT Right 12/16/12   knee- medial - makoplasty  . KNEE ARTHROSCOPY Right 2006   partial medial and lateral meniscectomies  . KNEE ARTHROSCOPY Right 10/06/2015   Procedure: RIGHT KNEE ARTHROSCOPY WITH DEBRIDEMENT;  Surgeon: Erin SonsHarold Kernodle, MD;  [facility];  Service: Orthopedics;  Laterality: Right;  Diabetic - insulin and oral meds  . ROTATOR CUFF REPAIR Left 2006  . TUBAL LIGATION  2000's   Family History  Problem Relation Age of Onset  . Hypertension Mother   . Hypertension Father   . Diabetes Father   . Allergies Father   . Breast cancer Neg Hx    Social History   Tobacco Use  . Smoking status: Never Smoker  . Smokeless tobacco: Never Used  Substance Use Topics  . Alcohol use: No   Allergies  Allergen Reactions  . Penicillins Anaphylaxis    Has patient had a PCN reaction causing immediate rash, facial/tongue/throat swelling, SOB or lightheadedness with hypotension: Yes Has patient had a PCN reaction causing severe rash involving mucus membranes or skin necrosis: No Has patient had a PCN reaction that required hospitalization No Has patient had a PCN reaction occurring within the last 10  years: No If all of the above answers are "NO", then may proceed with Cephalosporin use.   . Ace Inhibitors Swelling     Review of Systems  Constitutional: Positive for fatigue (with minimal exertion). Negative for appetite change.  HENT: Negative for congestion and postnasal drip.   Eyes: Negative.   Respiratory: Positive for shortness of breath (easily). Negative for cough and chest tightness.   Cardiovascular: Positive for chest pain (pressure), palpitations and leg swelling (better).  Gastrointestinal: Positive for abdominal distention (better).  Negative for abdominal pain.  Endocrine: Negative.   Genitourinary: Negative.   Musculoskeletal: Positive for arthralgias (feet hurting). Negative for back pain.  Skin: Negative.   Allergic/Immunologic: Negative.   Neurological: Positive for light-headedness. Negative for dizziness.  Hematological: Negative for adenopathy. Does not bruise/bleed easily.  Psychiatric/Behavioral: Positive for sleep disturbance. Negative for dysphoric mood. The patient is not nervous/anxious.      Physical Exam Vitals signs and nursing note reviewed.  Constitutional:      Appearance: She is well-developed.  HENT:     Head: Normocephalic and atraumatic.  Neck:     Musculoskeletal: Normal range of motion and neck supple.     Thyroid: No thyromegaly.  Cardiovascular:     Rate and Rhythm: Normal rate and regular rhythm.  Pulmonary:     Effort: Pulmonary effort is normal. No respiratory distress.     Breath sounds: No wheezing or rales.  Abdominal:     General: There is distension.     Palpations: Abdomen is soft.  Musculoskeletal:     Right lower leg: She exhibits no tenderness. No edema.     Left lower leg: She exhibits no tenderness. No edema.  Skin:    General: Skin is warm and dry.  Neurological:     Mental Status: She is alert and oriented to person, place, and time.  Psychiatric:        Behavior: Behavior normal.    Assessment & Plan:  1: Chronic heart failure with preserved ejection fraction- - NYHA class III - fluid status improving although continues to have abdominal distention - weighing daily and she was reminded & it was emphasized to call for an overnight weight gain of >2 pounds or a weekly weight gain of >5 pounds - weight   - follows with cardiology [name])  - not adding salt and it was reviewed about keeping daily sodium intake to 2000mg  daily - reviewed fluid intake and keeping daily intake to 60-64 ounces. Patient says that she probably drinks twice that amount because of  her dry mouth. Encouraged her to try sucking on sugar free candy instead of drinking so much fluid. - BNP 07/09/18 was 12.0   2: HTN- - BP  - saw PCP (Aycock) 04/24/18 - BMP 07/09/18 reviewed and showed sodium 139, potassium 4.3, creatinine 0.94 and GFR >60  3: DM- - A1c 10/14/17 was 7.6% - saw endocrinology Rosaura Carpenter) 06/29/18  4: Obstructive sleep apnea- - saw pulmonolgist Nicholos Johns) 04/14/18 - had sleep study scheduled for 07/09/18  5: Lymphedema- - stage 2 - limited in exercise due to symptoms  - unable to get compression socks on - has been wearing compression boots daily except for last night  Patient did not bring her medications nor a list. Each medication was verbally reviewed with the patient and she was encouraged to bring the bottles to every visit to confirm accuracy of list.

## 2018-09-10 NOTE — Telephone Encounter (Signed)
Patient did not show for her [facility] appointment on 09/10/2018. Will attempt to reschedule.  

## 2018-10-02 ENCOUNTER — Emergency Department: Payer: 59

## 2018-10-02 ENCOUNTER — Observation Stay: Payer: 59

## 2018-10-02 ENCOUNTER — Other Ambulatory Visit: Payer: Self-pay

## 2018-10-02 ENCOUNTER — Encounter: Payer: Self-pay | Admitting: Emergency Medicine

## 2018-10-02 ENCOUNTER — Observation Stay
Admission: EM | Admit: 2018-10-02 | Discharge: 2018-10-06 | Disposition: A | Discharge status: Home / Self Care | Payer: 59 | Attending: Internal Medicine | Admitting: Internal Medicine

## 2018-10-02 DIAGNOSIS — R410 Disorientation, unspecified: Secondary | ICD-10-CM | POA: Insufficient documentation

## 2018-10-02 DIAGNOSIS — I110 Hypertensive heart disease with heart failure: Secondary | ICD-10-CM | POA: Diagnosis not present

## 2018-10-02 DIAGNOSIS — G4733 Obstructive sleep apnea (adult) (pediatric): Secondary | ICD-10-CM | POA: Insufficient documentation

## 2018-10-02 DIAGNOSIS — F419 Anxiety disorder, unspecified: Secondary | ICD-10-CM | POA: Insufficient documentation

## 2018-10-02 DIAGNOSIS — Z79899 Other long term (current) drug therapy: Secondary | ICD-10-CM | POA: Insufficient documentation

## 2018-10-02 DIAGNOSIS — G43909 Migraine, unspecified, not intractable, without status migrainosus: Secondary | ICD-10-CM | POA: Diagnosis not present

## 2018-10-02 DIAGNOSIS — R531 Weakness: Secondary | ICD-10-CM | POA: Diagnosis not present

## 2018-10-02 DIAGNOSIS — R0602 Shortness of breath: Secondary | ICD-10-CM | POA: Insufficient documentation

## 2018-10-02 DIAGNOSIS — M797 Fibromyalgia: Secondary | ICD-10-CM | POA: Insufficient documentation

## 2018-10-02 DIAGNOSIS — K648 Other hemorrhoids: Secondary | ICD-10-CM | POA: Insufficient documentation

## 2018-10-02 DIAGNOSIS — R7989 Other specified abnormal findings of blood chemistry: Secondary | ICD-10-CM | POA: Diagnosis not present

## 2018-10-02 DIAGNOSIS — K644 Residual hemorrhoidal skin tags: Secondary | ICD-10-CM | POA: Insufficient documentation

## 2018-10-02 DIAGNOSIS — R000 Tachycardia, unspecified: Secondary | ICD-10-CM | POA: Diagnosis not present

## 2018-10-02 DIAGNOSIS — E1140 Type 2 diabetes mellitus with diabetic neuropathy, unspecified: Secondary | ICD-10-CM | POA: Insufficient documentation

## 2018-10-02 DIAGNOSIS — R0789 Other chest pain: Secondary | ICD-10-CM | POA: Insufficient documentation

## 2018-10-02 DIAGNOSIS — Z794 Long term (current) use of insulin: Secondary | ICD-10-CM | POA: Diagnosis not present

## 2018-10-02 DIAGNOSIS — K625 Hemorrhage of anus and rectum: Principal | ICD-10-CM | POA: Insufficient documentation

## 2018-10-02 DIAGNOSIS — I5032 Chronic diastolic (congestive) heart failure: Secondary | ICD-10-CM | POA: Insufficient documentation

## 2018-10-02 DIAGNOSIS — J45909 Unspecified asthma, uncomplicated: Secondary | ICD-10-CM | POA: Insufficient documentation

## 2018-10-02 DIAGNOSIS — F329 Major depressive disorder, single episode, unspecified: Secondary | ICD-10-CM | POA: Diagnosis not present

## 2018-10-02 DIAGNOSIS — Z96651 Presence of right artificial knee joint: Secondary | ICD-10-CM | POA: Insufficient documentation

## 2018-10-02 DIAGNOSIS — R945 Abnormal results of liver function studies: Secondary | ICD-10-CM

## 2018-10-02 DIAGNOSIS — K922 Gastrointestinal hemorrhage, unspecified: Secondary | ICD-10-CM

## 2018-10-02 DIAGNOSIS — K5909 Other constipation: Secondary | ICD-10-CM | POA: Diagnosis not present

## 2018-10-02 DIAGNOSIS — E785 Hyperlipidemia, unspecified: Secondary | ICD-10-CM | POA: Diagnosis not present

## 2018-10-02 DIAGNOSIS — Z8673 Personal history of transient ischemic attack (TIA), and cerebral infarction without residual deficits: Secondary | ICD-10-CM | POA: Insufficient documentation

## 2018-10-02 DIAGNOSIS — K219 Gastro-esophageal reflux disease without esophagitis: Secondary | ICD-10-CM | POA: Diagnosis not present

## 2018-10-02 DIAGNOSIS — Z79891 Long term (current) use of opiate analgesic: Secondary | ICD-10-CM | POA: Insufficient documentation

## 2018-10-02 DIAGNOSIS — Z9049 Acquired absence of other specified parts of digestive tract: Secondary | ICD-10-CM | POA: Insufficient documentation

## 2018-10-02 HISTORY — DX: Gastrointestinal hemorrhage, unspecified: K92.2

## 2018-10-02 LAB — COMPREHENSIVE METABOLIC PANEL
ALT: 101 U/L — ABNORMAL HIGH (ref 0–44)
AST: 117 U/L — ABNORMAL HIGH (ref 15–41)
Albumin: 4.5 g/dL (ref 3.5–5.0)
Alkaline Phosphatase: 138 U/L — ABNORMAL HIGH (ref 38–126)
Anion gap: 11 (ref 5–15)
BUN: 15 mg/dL (ref 6–20)
CO2: 23 mmol/L (ref 22–32)
Calcium: 9.7 mg/dL (ref 8.9–10.3)
Chloride: 104 mmol/L (ref 98–111)
Creatinine, Ser: 0.8 mg/dL (ref 0.44–1.00)
GFR calc Af Amer: >60 mL/min (ref >60)
GFR calc non Af Amer: >60 mL/min (ref >60)
Glucose, Bld: 329 mg/dL — ABNORMAL HIGH (ref 70–99)
Potassium: 3.3 mmol/L — ABNORMAL LOW (ref 3.5–5.1)
Sodium: 138 mmol/L (ref 135–145)
Total Bilirubin: 0.7 mg/dL (ref 0.3–1.2)
Total Protein: 8.6 g/dL — ABNORMAL HIGH (ref 6.5–8.1)

## 2018-10-02 LAB — CBC
HCT: 43.1 % (ref 36.0–46.0)
Hemoglobin: 14.2 g/dL (ref 12.0–15.0)
MCH: 29.8 pg (ref 26.0–34.0)
MCHC: 32.9 g/dL (ref 30.0–36.0)
MCV: 90.5 fL (ref 80.0–100.0)
Platelets: 300 10*3/uL (ref 150–400)
RBC: 4.76 MIL/uL (ref 3.87–5.11)
RDW: 14.2 % (ref 11.5–15.5)
WBC: 6 10*3/uL (ref 4.0–10.5)
nRBC: 0 % (ref 0.0–0.2)

## 2018-10-02 LAB — TYPE AND SCREEN
ABO/RH(D): O POS
Antibody Screen: NEGATIVE

## 2018-10-02 LAB — URINALYSIS, COMPLETE (UACMP) WITH MICROSCOPIC
Bilirubin Urine: NEGATIVE
Glucose, UA: >=500 mg/dL — AB
Ketones, ur: NEGATIVE mg/dL
Nitrite: NEGATIVE
Protein, ur: NEGATIVE mg/dL
Specific Gravity, Urine: 1.024 (ref 1.005–1.030)
pH: 7 (ref 5.0–8.0)

## 2018-10-02 LAB — PROTIME-INR
INR: 0.94
Prothrombin Time: 12.5 seconds (ref 11.4–15.2)

## 2018-10-02 LAB — BLOOD GAS, VENOUS
Acid-base deficit: 0.2 mmol/L (ref 0.0–2.0)
Patient temperature: 37
pCO2, Ven: 47 mmHg (ref 44.0–60.0)
pH, Ven: 7.35 (ref 7.250–7.430)
pO2, Ven: 37 mmHg (ref 32.0–45.0)

## 2018-10-02 LAB — LACTIC ACID, PLASMA: Lactic Acid, Venous: 3.3 mmol/L (ref 0.5–1.9)

## 2018-10-02 LAB — GLUCOSE, CAPILLARY
Glucose-Capillary: 323 mg/dL — ABNORMAL HIGH (ref 70–99)
Glucose-Capillary: 273 mg/dL — ABNORMAL HIGH (ref 70–99)

## 2018-10-02 LAB — AMMONIA: Ammonia: 42 umol/L — ABNORMAL HIGH (ref 9–35)

## 2018-10-02 MED ORDER — PANTOPRAZOLE SODIUM 40 MG PO TBEC
40.0000 mg | DELAYED_RELEASE_TABLET | Freq: Every day | ORAL | Status: DC
  Administered 2018-10-02 – 2018-10-05 (×4): 40 mg via ORAL
  Filled 2018-10-02 (×4): qty 1

## 2018-10-02 MED ORDER — CLONAZEPAM 0.5 MG PO TABS
0.5000 mg | ORAL_TABLET | Freq: Two times a day (BID) | ORAL | Status: DC
  Administered 2018-10-02 – 2018-10-05 (×7): 0.5 mg via ORAL
  Filled 2018-10-02 (×7): qty 1

## 2018-10-02 MED ORDER — MIRTAZAPINE 15 MG PO TBDP
30.0000 mg | ORAL_TABLET | Freq: Every day | ORAL | Status: DC
  Administered 2018-10-02 – 2018-10-04 (×3): 30 mg via ORAL
  Filled 2018-10-02 (×3): qty 2

## 2018-10-02 MED ORDER — TRAMADOL HCL 50 MG PO TABS
50.0000 mg | ORAL_TABLET | Freq: Four times a day (QID) | ORAL | Status: DC | PRN
  Administered 2018-10-02 – 2018-10-05 (×5): 50 mg via ORAL
  Filled 2018-10-02 (×4): qty 1

## 2018-10-02 MED ORDER — ONDANSETRON HCL 4 MG PO TABS: 4.0000 mg | ORAL_TABLET | Freq: Four times a day (QID) | ORAL | Status: DC | PRN

## 2018-10-02 MED ORDER — TIOTROPIUM BROMIDE MONOHYDRATE 18 MCG IN CAPS
18.0000 ug | ORAL_CAPSULE | Freq: Every day | RESPIRATORY_TRACT | Status: DC
  Administered 2018-10-02 – 2018-10-06 (×5): 18 ug via RESPIRATORY_TRACT
  Filled 2018-10-02: qty 5

## 2018-10-02 MED ORDER — ACETAMINOPHEN 325 MG PO TABS: 650.0000 mg | ORAL_TABLET | Freq: Four times a day (QID) | ORAL | Status: DC | PRN

## 2018-10-02 MED ORDER — ONDANSETRON HCL 4 MG/2ML IJ SOLN: 4.0000 mg | Freq: Four times a day (QID) | INTRAMUSCULAR | Status: DC | PRN

## 2018-10-02 MED ORDER — SODIUM CHLORIDE 0.9 % IV BOLUS
500.0000 mL | Freq: Once | INTRAVENOUS | Status: AC
  Administered 2018-10-02: 500 mL via INTRAVENOUS

## 2018-10-02 MED ORDER — ACETAMINOPHEN 650 MG RE SUPP: 650.0000 mg | Freq: Four times a day (QID) | RECTAL | Status: DC | PRN

## 2018-10-02 MED ORDER — TIZANIDINE HCL 4 MG PO TABS
4.0000 mg | ORAL_TABLET | Freq: Three times a day (TID) | ORAL | Status: DC | PRN
  Administered 2018-10-02 – 2018-10-05 (×5): 4 mg via ORAL
  Filled 2018-10-02 (×6): qty 1

## 2018-10-02 MED ORDER — IPRATROPIUM-ALBUTEROL 0.5-2.5 (3) MG/3ML IN SOLN: 3.0000 mL | Freq: Three times a day (TID) | RESPIRATORY_TRACT | Status: DC | PRN

## 2018-10-02 MED ORDER — MONTELUKAST SODIUM 10 MG PO TABS
10.0000 mg | ORAL_TABLET | Freq: Every day | ORAL | Status: DC
  Administered 2018-10-02 – 2018-10-05 (×4): 10 mg via ORAL
  Filled 2018-10-02 (×4): qty 1

## 2018-10-02 MED ORDER — LABETALOL HCL 5 MG/ML IV SOLN
10.0000 mg | Freq: Once | INTRAVENOUS | Status: AC
  Administered 2018-10-02: 10 mg via INTRAVENOUS

## 2018-10-02 MED ORDER — INSULIN ASPART 100 UNIT/ML ~~LOC~~ SOLN
0.0000 [IU] | Freq: Three times a day (TID) | SUBCUTANEOUS | Status: DC
  Administered 2018-10-02 – 2018-10-03 (×2): 7 [IU] via SUBCUTANEOUS
  Administered 2018-10-03 (×2): 5 [IU] via SUBCUTANEOUS
  Administered 2018-10-04 (×2): 7 [IU] via SUBCUTANEOUS
  Administered 2018-10-04: 9 [IU] via SUBCUTANEOUS
  Administered 2018-10-05 (×2): 3 [IU] via SUBCUTANEOUS
  Administered 2018-10-05: 5 [IU] via SUBCUTANEOUS
  Filled 2018-10-02 (×10): qty 1

## 2018-10-02 MED ORDER — LINACLOTIDE 290 MCG PO CAPS
290.0000 ug | ORAL_CAPSULE | Freq: Every day | ORAL | Status: DC
  Administered 2018-10-03 – 2018-10-05 (×3): 290 ug via ORAL
  Filled 2018-10-02 (×4): qty 1

## 2018-10-02 MED ORDER — ALBUTEROL SULFATE (2.5 MG/3ML) 0.083% IN NEBU: 2.5000 mg | INHALATION_SOLUTION | RESPIRATORY_TRACT | Status: DC | PRN

## 2018-10-02 MED ORDER — INSULIN ASPART 100 UNIT/ML ~~LOC~~ SOLN
0.0000 [IU] | Freq: Every day | SUBCUTANEOUS | Status: DC
  Administered 2018-10-02: 3 [IU] via SUBCUTANEOUS
  Administered 2018-10-03: 2 [IU] via SUBCUTANEOUS
  Administered 2018-10-04: 4 [IU] via SUBCUTANEOUS
  Filled 2018-10-02 (×3): qty 1

## 2018-10-02 MED ORDER — SUCRALFATE 1 G PO TABS
1.0000 g | ORAL_TABLET | Freq: Two times a day (BID) | ORAL | Status: DC
  Administered 2018-10-02 – 2018-10-05 (×7): 1 g via ORAL
  Filled 2018-10-02 (×7): qty 1

## 2018-10-02 MED ORDER — SODIUM CHLORIDE 0.9 % IV SOLN
80.0000 mg | Freq: Once | INTRAVENOUS | Status: AC
  Administered 2018-10-02: 80 mg via INTRAVENOUS
  Filled 2018-10-02: qty 80

## 2018-10-02 MED ORDER — DULOXETINE HCL 30 MG PO CPEP
60.0000 mg | ORAL_CAPSULE | Freq: Two times a day (BID) | ORAL | Status: DC
  Administered 2018-10-02 – 2018-10-05 (×7): 60 mg via ORAL
  Filled 2018-10-02 (×7): qty 2

## 2018-10-02 MED ORDER — FLUTICASONE FUROATE-VILANTEROL 200-25 MCG/INH IN AEPB
1.0000 | INHALATION_SPRAY | Freq: Every day | RESPIRATORY_TRACT | Status: DC
  Administered 2018-10-02 – 2018-10-06 (×5): 1 via RESPIRATORY_TRACT
  Filled 2018-10-02: qty 28

## 2018-10-02 MED ORDER — METOPROLOL SUCCINATE ER 25 MG PO TB24
25.0000 mg | ORAL_TABLET | Freq: Every day | ORAL | Status: DC
  Administered 2018-10-02 – 2018-10-03 (×2): 25 mg via ORAL
  Filled 2018-10-02 (×2): qty 1

## 2018-10-02 MED ORDER — SODIUM CHLORIDE 0.9 % IV SOLN
8.0000 mg/h | INTRAVENOUS | Status: DC
  Filled 2018-10-02: qty 80

## 2018-10-02 MED ORDER — SODIUM CHLORIDE 0.9 % IV SOLN
INTRAVENOUS | Status: DC
  Administered 2018-10-02 – 2018-10-03 (×3): via INTRAVENOUS

## 2018-10-02 MED ORDER — LABETALOL HCL 5 MG/ML IV SOLN
INTRAVENOUS | Status: AC
  Filled 2018-10-02: qty 4

## 2018-10-02 MED ORDER — ARIPIPRAZOLE 2 MG PO TABS
2.0000 mg | ORAL_TABLET | Freq: Every day | ORAL | Status: DC
  Administered 2018-10-02 – 2018-10-05 (×4): 2 mg via ORAL
  Filled 2018-10-02 (×5): qty 1

## 2018-10-02 MED ORDER — GABAPENTIN 300 MG PO CAPS
600.0000 mg | ORAL_CAPSULE | Freq: Two times a day (BID) | ORAL | Status: DC
  Administered 2018-10-02 – 2018-10-05 (×7): 600 mg via ORAL
  Filled 2018-10-02 (×7): qty 2

## 2018-10-02 MED ORDER — ZOLPIDEM TARTRATE 5 MG PO TABS
5.0000 mg | ORAL_TABLET | Freq: Every evening | ORAL | Status: DC | PRN
  Administered 2018-10-04 – 2018-10-05 (×2): 5 mg via ORAL
  Filled 2018-10-02 (×2): qty 1

## 2018-10-02 MED ORDER — PREGABALIN 75 MG PO CAPS
150.0000 mg | ORAL_CAPSULE | Freq: Three times a day (TID) | ORAL | Status: DC
  Administered 2018-10-02 – 2018-10-05 (×10): 150 mg via ORAL
  Filled 2018-10-02 (×11): qty 2

## 2018-10-02 MED ORDER — PRAVASTATIN SODIUM 20 MG PO TABS
80.0000 mg | ORAL_TABLET | Freq: Every day | ORAL | Status: DC
  Administered 2018-10-02 – 2018-10-05 (×4): 80 mg via ORAL
  Filled 2018-10-02 (×4): qty 4

## 2018-10-02 NOTE — ED Triage Notes (Signed)
Pt in via POV, reports shortness of breath, weakness, and bleeding hemorrhoids x one week.  Pt lethargic in triage, drifting to sleep frequently.  Pt tachycardic, other vitals WDL.

## 2018-10-02 NOTE — ED Notes (Signed)
Pt transported to room 213.

## 2018-10-02 NOTE — Progress Notes (Signed)
   10/02/18 2000  Clinical Encounter Type  Visited With Patient and family together  Visit Type Initial  Spiritual Encounters  Spiritual Needs Prayer;Emotional  Stress Factors  Patient Stress Factors Major life changes  Pt requested education on Advance Directive. Pt's mother was at bedside. Pt shared her hurtful experience with her children who did not care for her when she had a stroke last January. She was disappointed with her children and this is having a major impact on her decision on who to be her health care power of attorney. Pt wants her mother to take this responsibility but her mother refuses it. Ch facilitated the conversation between pt and her mother. Ch decided they need to take more time talking through this. Ch offered a prayer of guidance and let them know that chaplain service is always available.

## 2018-10-02 NOTE — Care Management Obs Status (Signed)
MEDICARE OBSERVATION STATUS NOTIFICATION   Patient Details  Name: Amanda [name] MRN: [id number] Date of Birth: 1966-08-20   Medicare Observation Status Notification Given:  Yes    Eber Hong, RN 10/02/2018, 4:49 PM

## 2018-10-02 NOTE — H&P (Signed)
[employer] - [place] [facility]    PATIENT NAME: Amanda [name]    MR#:  [id number]  DATE OF BIRTH:  09-02-1966  DATE OF ADMISSION:  10/02/2018  PRIMARY CARE PHYSICIAN: Emogene Morgan, MD   REQUESTING/REFERRING PHYSICIAN: Dr. Willy Eddy  CHIEF COMPLAINT:   Chief Complaint  Patient presents with  . Weakness  . Rectal Bleeding  . Shortness of Breath    HISTORY OF PRESENT ILLNESS:  Amanda [name]  is a 53 y.o. female with a known history of diabetes, fibromyalgia, GERD, hypertension, anxiety, depression, struct of sleep apnea, history of previous CVA, asthma who presents to the hospital due to multiple episodes of rectal bleeding, generalized weakness and shortness of breath.  Patient says that she has had rectal bleeding ongoing for the past 2 weeks.  She is also complaining of some exertional dyspnea and some generalized weakness.  She presented to the ER was noted to be tachycardic, hemoglobin is stable, noted to have some abnormal LFTs.  Given her ongoing rectal bleeding hospitalist services were contacted for admission.  Patient denies any abdominal pain, fever, chills, nausea, vomiting, or any other associated symptoms presently.  Patient currently is not on any blood thinners.  She denies any alcohol or substance abuse.  PAST MEDICAL HISTORY:   Past Medical History:  Diagnosis Date  . Arthritis    knees, shoulder, upper back  . Asthma   . CHF (congestive heart failure) (HCC)   . Diabetes mellitus without complication (HCC)   . Environmental allergies   . Fibromyalgia   . GERD (gastroesophageal reflux disease)   . Headache    migraines - 5x/mo  . Hypertension   . Hypokalemia 10/14/2017  . Migraines   . Motion sickness    ships  . Sleep apnea   . Stroke [facility])    no residual deficits  . Thyroid nodule    bilateral and goiter  . Wears contact lenses   . Wears dentures    partial upper    PAST SURGICAL HISTORY:   Past Surgical History:    Procedure Laterality Date  . ABDOMINAL HYSTERECTOMY  2000's  . ABDOMINAL SURGERY     laparoscopy x4 with lysis of adhesions  . APPENDECTOMY  1986  . CESAREAN SECTION  1992  . CHOLECYSTECTOMY N/A 04/03/2017   Procedure: LAPAROSCOPIC CHOLECYSTECTOMY;  Surgeon: Henrene Dodge, MD;  Location: [facility] ORS;  Service: General;  Laterality: N/A;  . COLONOSCOPY WITH PROPOFOL N/A 03/10/2017   Procedure: COLONOSCOPY WITH PROPOFOL;  Surgeon: Scot Jun, MD;  Location: [facility] ENDOSCOPY;  Service: Endoscopy;  Laterality: N/A;  . ECTOPIC PREGNANCY SURGERY  2000's  . ESOPHAGOGASTRODUODENOSCOPY (EGD) WITH PROPOFOL N/A 03/10/2017   Procedure: ESOPHAGOGASTRODUODENOSCOPY (EGD) WITH PROPOFOL;  Surgeon: Scot Jun, MD;  Location: [facility] ENDOSCOPY;  Service: Endoscopy;  Laterality: N/A;  . HERNIA REPAIR    . JOINT REPLACEMENT Right 12/16/12   knee- medial - makoplasty  . KNEE ARTHROSCOPY Right 2006   partial medial and lateral meniscectomies  . KNEE ARTHROSCOPY Right 10/06/2015   Procedure: RIGHT KNEE ARTHROSCOPY WITH DEBRIDEMENT;  Surgeon: Erin Sons, MD;  Location: [facility];  Service: Orthopedics;  Laterality: Right;  Diabetic - insulin and oral meds  . ROTATOR CUFF REPAIR Left 2006  . TUBAL LIGATION  2000's    SOCIAL HISTORY:   Social History   Tobacco Use  . Smoking status: Never Smoker  . Smokeless tobacco: Never Used  Substance Use Topics  . Alcohol use: No  FAMILY HISTORY:   Family History  Problem Relation Age of Onset  . Hypertension Mother   . Hypertension Father   . Diabetes Father   . Allergies Father   . Kidney disease Father   . Breast cancer Neg Hx     DRUG ALLERGIES:   Allergies  Allergen Reactions  . Penicillins Anaphylaxis    Has patient had a PCN reaction causing immediate rash, facial/tongue/throat swelling, SOB or lightheadedness with hypotension: Yes Has patient had a PCN reaction causing severe rash involving mucus membranes or skin necrosis:  No Has patient had a PCN reaction that required hospitalization No Has patient had a PCN reaction occurring within the last 10 years: No If all of the above answers are "NO", then may proceed with Cephalosporin use.   . Ace Inhibitors Swelling    REVIEW OF SYSTEMS:   Review of Systems  Constitutional: Negative for fever and weight loss.  HENT: Negative for congestion, nosebleeds and tinnitus.   Eyes: Negative for blurred vision, double vision and redness.  Respiratory: Positive for shortness of breath. Negative for cough and hemoptysis.   Cardiovascular: Negative for chest pain, orthopnea, leg swelling and PND.  Gastrointestinal: Positive for blood in stool. Negative for abdominal pain, diarrhea, melena, nausea and vomiting.  Genitourinary: Negative for dysuria, hematuria and urgency.  Musculoskeletal: Negative for falls and joint pain.  Neurological: Positive for weakness (geeneralized). Negative for dizziness, tingling, sensory change, focal weakness, seizures and headaches.  Endo/Heme/Allergies: Negative for polydipsia. Does not bruise/bleed easily.  Psychiatric/Behavioral: Negative for depression, hallucinations and memory loss. The patient is not nervous/anxious.     MEDICATIONS AT HOME:   Prior to Admission medications   Medication Sig Start Date End Date Taking? Authorizing Provider  albuterol (PROVENTIL HFA) 108 (90 Base) MCG/ACT inhaler Inhale 2 puffs into the lungs every 4 (four) hours as needed for wheezing or shortness of breath. 07/21/18  Yes Shane Crutch, MD  ARIPiprazole (ABILIFY) 2 MG tablet Take 2 mg by mouth daily.    Yes [provider]  Ascorbic Acid (VITAMIN C) 1000 MG tablet Take 500 mg by mouth 2 (two) times daily.    Yes [provider]  BREO ELLIPTA 200-25 MCG/INH AEPB Inhale 1 puff into the lungs daily. Patient taking differently: Inhale 1 puff into the lungs daily.  04/23/18  Yes Shane Crutch, MD  canagliflozin (INVOKANA)  300 MG TABS tablet Take 300 mg by mouth daily before breakfast.   Yes [provider]  clonazePAM (KLONOPIN) 0.5 MG tablet Take 0.5 mg by mouth 2 (two) times daily.   Yes [provider]  DULoxetine (CYMBALTA) 60 MG capsule Take 60 mg by mouth 2 (two) times daily.   Yes [provider]  furosemide (LASIX) 20 MG tablet Take 1 tablet (20 mg total) by mouth daily. 07/20/18 10/02/18 Yes Pyreddy, Vivien Rota, MD  gabapentin (NEURONTIN) 300 MG capsule Take 600 mg by mouth 2 (two) times daily. 03/22/18  Yes [provider]  insulin detemir (LEVEMIR) 100 UNIT/ML injection Inject 12-20 Units into the skin at bedtime.    Yes [provider]  ipratropium-albuterol (DUONEB) 0.5-2.5 (3) MG/3ML SOLN Inhale contents of 1 vial via nebulizer 3 times daily as needed for shortness of breath/wheeze/cough. Patient taking differently: Inhale 3 mLs into the lungs 3 (three) times daily as needed (shortness of breath, wheezing or cough).  11/21/17  Yes Shane Crutch, MD  isosorbide mononitrate (IMDUR) 30 MG 24 hr tablet Take 1 tablet (30 mg total) by mouth  daily. 10/17/17  Yes Johnson, Clanford L, MD  lidocaine (LIDODERM) 5 % Place 1 patch onto the skin daily. (remove after 12 hours and apply new patch 12 hours later) 02/24/18  Yes [provider]  LINZESS 290 MCG CAPS capsule Take 1 tablet by mouth daily before breakfast.  02/28/17  Yes [provider]  losartan (COZAAR) 25 MG tablet Take 1 tablet (25 mg total) by mouth daily. 07/20/18 10/02/18 Yes Pyreddy, Vivien Rota, MD  metoprolol succinate (TOPROL-XL) 25 MG 24 hr tablet Take 1 tablet (25 mg total) by mouth daily. Take with or immediately following a meal. 07/20/18 10/02/18 Yes Pyreddy, Pavan, MD  mirtazapine (REMERON SOL-TAB) 30 MG disintegrating tablet Take 30 mg by mouth daily.  03/18/18  Yes [provider]  montelukast (SINGULAIR) 10 MG tablet Take 1 tablet (10 mg total) by mouth daily. 07/21/18 10/02/18 Yes  Shane Crutch, MD  Multiple Vitamin (MULTIVITAMIN) tablet Take 1 tablet by mouth daily.   Yes [provider]  omeprazole (PRILOSEC) 40 MG capsule Take 1 capsule (40 mg total) by mouth daily. 1 hr before supper 04/05/17  Yes Piscoya, Jose, MD  Potassium Chloride ER 20 MEQ TBCR Take 20 mEq by mouth daily. Take with lasix 09/30/18  Yes [provider]  pravastatin (PRAVACHOL) 80 MG tablet Take 1 tablet (80 mg total) by mouth daily. 10/17/17 06/04/19 Yes Johnson, Clanford L, MD  pregabalin (LYRICA) 150 MG capsule Take 150 mg by mouth 3 (three) times daily.   Yes [provider]  sitaGLIPtin (JANUVIA) 100 MG tablet Take 100 mg by mouth daily.   Yes [provider]  sucralfate (CARAFATE) 1 g tablet Take 1 g by mouth 2 (two) times daily.    Yes [provider]  SUMAtriptan (IMITREX) 25 MG tablet Take 25 mg by mouth every 2 (two) hours as needed for migraine. May repeat in 2 hours if headache persists or recurs.   Yes [provider]  tiotropium (SPIRIVA HANDIHALER) 18 MCG inhalation capsule Place 1 capsule (18 mcg total) into inhaler and inhale daily. 06/24/18 06/24/19 Yes Shane Crutch, MD  tiZANidine (ZANAFLEX) 4 MG tablet Take 4 mg by mouth every 8 (eight) hours as needed for muscle spasms.    Yes [provider]  traMADol (ULTRAM) 50 MG tablet Take 50 mg by mouth every 6 (six) hours as needed for moderate pain.   Yes [provider]  zolpidem (AMBIEN) 10 MG tablet Take 10 mg by mouth at bedtime as needed for sleep.    Yes [provider]  flunisolide (NASALIDE) 25 MCG/ACT (0.025%) SOLN Place 2 sprays into the nose daily as needed.    [provider]  HYDROcodone-acetaminophen (NORCO/VICODIN) 5-325 MG tablet Take 1-2 tablets by mouth at bedtime as needed for moderate pain.    [provider]      VITAL SIGNS:  Blood pressure (!) 159/96, pulse (!) 137, temperature 98.4 F (36.9 C),  temperature source Oral, resp. rate (!) 24, height 5\' 5"  (1.651 m), weight 88.9 kg, SpO2 98 %.  PHYSICAL EXAMINATION:  Physical Exam  GENERAL:  53 y.o.-year-old patient lying in Bed in NAD.   EYES: Pupils equal, round, reactive to light and accommodation. No scleral icterus. Extraocular muscles intact.  HEENT: Head atraumatic, normocephalic. Oropharynx and nasopharynx clear. No oropharyngeal erythema, moist oral mucosa  NECK:  Supple, no jugular venous distention. No thyroid enlargement, no tenderness.  LUNGS: Normal breath sounds bilaterally, no wheezing, rales, rhonchi. No use of accessory muscles of  respiration.   CARDIOVASCULAR: S1, S2 RRR. No murmurs, rubs, gallops, clicks.  ABDOMEN: Soft, nontender, nondistended. Bowel sounds present. No organomegaly or mass.  EXTREMITIES: No pedal edema, cyanosis, or clubbing. + 2 pedal & radial pulses b/l.   NEUROLOGIC: Cranial nerves II through XII are intact. No focal Motor or sensory deficits appreciated b/l.  Globally weak PSYCHIATRIC: The patient is alert and oriented x 3.  SKIN: No obvious rash, lesion, or ulcer.   LABORATORY PANEL:   CBC Recent Labs  Lab 10/02/18 1419  WBC 6.0  HGB 14.2  HCT 43.1  PLT 300   ------------------------------------------------------------------------------------------------------------------  Chemistries  Recent Labs  Lab 10/02/18 1419  NA 138  K 3.3*  CL 104  CO2 23  GLUCOSE 329*  BUN 15  CREATININE 0.80  CALCIUM 9.7  AST 117*  ALT 101*  ALKPHOS 138*  BILITOT 0.7   ------------------------------------------------------------------------------------------------------------------  Cardiac Enzymes No results for input(s): TROPONINI in the last 168 hours. ------------------------------------------------------------------------------------------------------------------  RADIOLOGY:  Ct Head Wo Contrast  Result Date: 10/02/2018 CLINICAL DATA:  Altered level of consciousness. EXAM: CT HEAD  WITHOUT CONTRAST TECHNIQUE: Contiguous axial images were obtained from the base of the skull through the vertex without intravenous contrast. COMPARISON:  CT scan of October 14, 2017. FINDINGS: Brain: No evidence of acute infarction, hemorrhage, hydrocephalus, extra-axial collection or mass lesion/mass effect. Vascular: No hyperdense vessel or unexpected calcification. Skull: Normal. Negative for fracture or focal lesion. Sinuses/Orbits: No acute finding. Other: None. IMPRESSION: Normal head CT. Electronically Signed   By: Lupita RaiderJames  Green Jr, M.D.   On: 10/02/2018 15:13   Dg Chest Portable 1 View  Result Date: 10/02/2018 CLINICAL DATA:  Shortness of breath, weakness and gastrointestinal bleeding over the last week. EXAM: PORTABLE CHEST 1 VIEW COMPARISON:  07/17/2018 FINDINGS: The heart size and mediastinal contours are within normal limits. Both lungs are clear. The visualized skeletal structures are unremarkable. IMPRESSION: No active disease. Electronically Signed   By: Paulina FusiMark  Shogry M.D.   On: 10/02/2018 15:01     IMPRESSION AND PLAN:   53 y.o. female with a known history of diabetes, fibromyalgia, GERD, hypertension, anxiety, depression, struct of sleep apnea, history of previous CVA, asthma who presents to the hospital due to multiple episodes of rectal bleeding, generalized weakness and shortness of breath.  1.  GI bleed- suspected to be a lower GI bleed given patient's multiple episodes of rectal bleeding.  Patient does have a history of hemorrhoids.  As per the ER physician the rectal exam was maroon stool which was heme positive. -Hemoglobin currently stable, will place on IV fluids, follow hemoglobin.  Get a gastroenterology consult. -Patient is not on any blood thinners.  2.  Tachycardia-patient has sinus tachycardia with heart rates in the 120s to 130s.  I suspect this is secondary to dehydration from the GI bleed.  Continue IV fluid hydration, follow heart rate.  3.  Shortness of  breath-etiology unclear, patient is not hypoxic.  She is tachycardic.  No clinical evidence of congestive heart failure. - will cont. To monitor.   4.  Diabetes type 2 without complication-hold patient's schedule Levemir, Invokana. -We will place patient on sliding scale insulin for now.  5.  Essential hypertension-hold losartan, Imdur, Lasix.  Continue low-dose Toprol for now.  6.  Diabetic neuropathy-continue gabapentin, Lyrica.  7.  Anxiety/depression-continue Abilify, Cymbalta, Klonopin.  8.  Abnormal LFTs-etiology unclear presently.  Will follow LFTs, get a right upper quadrant ultrasound.  9.  Hyperlipidemia-continue Pravachol.  10.  GERD-continue  Protonix.    All the records are reviewed and case discussed with ED provider. Management plans discussed with the patient, family and they are in agreement.  CODE STATUS: Full code  TOTAL TIME TAKING CARE OF THIS PATIENT: 45 minutes.    Houston SirenSAINANI,Gearldean Lomanto J M.D on 10/02/2018 at 4:09 PM  Between 7am to 6pm - Pager - [phone]  After 6pm go to [email or web address] - password [email or web address] [facility]  [facility]  Office  [phone]  CC: Primary care physician; Emogene MorganAycock, Ngwe A, MD

## 2018-10-02 NOTE — ED Notes (Signed)
Pt hypertensive per family she did not take home meds today. MD Sianni made aware. See new orders.

## 2018-10-02 NOTE — ED Provider Notes (Signed)
[facility] Emergency Department Provider Note    First MD Initiated Contact with Patient 10/02/18 1427     (approximate)  I have reviewed the triage vital signs and the nursing notes.   HISTORY  Chief Complaint Weakness; Rectal Bleeding; and Shortness of Breath    HPI ZALAYAH PIZZUTO is a 53 y.o. female chief complaint of drowsiness and blood in her stool it is primarily dark red in color.  States been happening daily for the past several days.  Family members at bedside states that she is more confused and has not been getting out of bed.  Denies any intentional overdose.  No report of trauma.  Does have a history of hemorrhoids and that is what they thought the bleeding was from.  No history of liver disease.  Not currently on any blood thinners.  Denies any recent fevers.  No trauma.  Family states that she was admitted to [facility] years ago for similar drowsiness with stroke work-up and neurology consultation but it was otherwise an unremarkable work-up has since been doing well.    Past Medical History:  Diagnosis Date  . Arthritis    knees, shoulder, upper back  . Asthma   . CHF (congestive heart failure) (HCC)   . Diabetes mellitus without complication (HCC)   . Environmental allergies   . Fibromyalgia   . GERD (gastroesophageal reflux disease)   . Headache    migraines - 5x/mo  . Hypertension   . Hypokalemia 10/14/2017  . Migraines   . Motion sickness    ships  . Sleep apnea   . Stroke [facility])    no residual deficits  . Thyroid nodule    bilateral and goiter  . Wears contact lenses   . Wears dentures    partial upper   Family History  Problem Relation Age of Onset  . Hypertension Mother   . Hypertension Father   . Diabetes Father   . Allergies Father   . Breast cancer Neg Hx    Past Surgical History:  Procedure Laterality Date  . ABDOMINAL HYSTERECTOMY  2000's  . ABDOMINAL SURGERY     laparoscopy x4 with lysis of adhesions    . APPENDECTOMY  1986  . CESAREAN SECTION  1992  . CHOLECYSTECTOMY N/A 04/03/2017   Procedure: LAPAROSCOPIC CHOLECYSTECTOMY;  Surgeon: Henrene Dodge, MD;  Location: [facility] ORS;  Service: General;  Laterality: N/A;  . COLONOSCOPY WITH PROPOFOL N/A 03/10/2017   Procedure: COLONOSCOPY WITH PROPOFOL;  Surgeon: Scot Jun, MD;  Location: [facility] ENDOSCOPY;  Service: Endoscopy;  Laterality: N/A;  . ECTOPIC PREGNANCY SURGERY  2000's  . ESOPHAGOGASTRODUODENOSCOPY (EGD) WITH PROPOFOL N/A 03/10/2017   Procedure: ESOPHAGOGASTRODUODENOSCOPY (EGD) WITH PROPOFOL;  Surgeon: Scot Jun, MD;  Location: [facility] ENDOSCOPY;  Service: Endoscopy;  Laterality: N/A;  . HERNIA REPAIR    . JOINT REPLACEMENT Right 12/16/12   knee- medial - makoplasty  . KNEE ARTHROSCOPY Right 2006   partial medial and lateral meniscectomies  . KNEE ARTHROSCOPY Right 10/06/2015   Procedure: RIGHT KNEE ARTHROSCOPY WITH DEBRIDEMENT;  Surgeon: Erin Sons, MD;  Location: [facility];  Service: Orthopedics;  Laterality: Right;  Diabetic - insulin and oral meds  . ROTATOR CUFF REPAIR Left 2006  . TUBAL LIGATION  2000's   Patient Active Problem List   Diagnosis Date Noted  . Acute on chronic diastolic (congestive) heart failure (HCC) 07/17/2018  . Acute on chronic heart failure (HCC) 07/08/2018  . Chronic diastolic heart  failure (HCC) 04/17/2018  . Lymphedema 04/17/2018  . Chest pain 03/28/2018  . Hypertension 10/15/2017  . Wears dentures 10/15/2017  . Symptomatic cholelithiasis 04/02/2017  . Recurrent incisional hernia 03/17/2017  . Calculus of gallbladder without cholecystitis without obstruction 03/17/2017  . Helicobacter pylori gastritis 03/17/2017  . Depression 09/28/2016  . Diabetes mellitus, type 2 (HCC) 09/28/2016  . Tremor 07/01/2013  . Esophageal reflux 06/22/2012  . Major depressive disorder, single episode 08/22/2009  . Sleep apnea 05/30/2009  . Hyperlipidemia 08/10/2008      Prior to Admission  medications   Medication Sig Start Date End Date Taking? Authorizing Provider  albuterol (PROVENTIL HFA) 108 (90 Base) MCG/ACT inhaler Inhale 2 puffs into the lungs every 4 (four) hours as needed for wheezing or shortness of breath. 07/21/18  Yes Shane Crutch, MD  ARIPiprazole (ABILIFY) 2 MG tablet Take 2 mg by mouth daily.    Yes [provider]  Ascorbic Acid (VITAMIN C) 1000 MG tablet Take 500 mg by mouth 2 (two) times daily.    Yes [provider]  BREO ELLIPTA 200-25 MCG/INH AEPB Inhale 1 puff into the lungs daily. Patient taking differently: Inhale 1 puff into the lungs daily.  04/23/18  Yes Shane Crutch, MD  canagliflozin (INVOKANA) 300 MG TABS tablet Take 300 mg by mouth daily before breakfast.   Yes [provider]  clonazePAM (KLONOPIN) 0.5 MG tablet Take 0.5 mg by mouth 2 (two) times daily.   Yes [provider]  DULoxetine (CYMBALTA) 60 MG capsule Take 60 mg by mouth 2 (two) times daily.   Yes [provider]  furosemide (LASIX) 20 MG tablet Take 1 tablet (20 mg total) by mouth daily. 07/20/18 10/02/18 Yes Pyreddy, Vivien Rota, MD  gabapentin (NEURONTIN) 300 MG capsule Take 600 mg by mouth 2 (two) times daily. 03/22/18  Yes [provider]  insulin detemir (LEVEMIR) 100 UNIT/ML injection Inject 12-20 Units into the skin at bedtime.    Yes [provider]  ipratropium-albuterol (DUONEB) 0.5-2.5 (3) MG/3ML SOLN Inhale contents of 1 vial via nebulizer 3 times daily as needed for shortness of breath/wheeze/cough. Patient taking differently: Inhale 3 mLs into the lungs 3 (three) times daily as needed (shortness of breath, wheezing or cough).  11/21/17  Yes Shane Crutch, MD  isosorbide mononitrate (IMDUR) 30 MG 24 hr tablet Take 1 tablet (30 mg total) by mouth daily. 10/17/17  Yes Johnson, Clanford L, MD  lidocaine (LIDODERM) 5 % Place 1 patch onto the skin daily. (remove after 12 hours and apply new patch 12 hours  later) 02/24/18  Yes [provider]  LINZESS 290 MCG CAPS capsule Take 1 tablet by mouth daily before breakfast.  02/28/17  Yes [provider]  losartan (COZAAR) 25 MG tablet Take 1 tablet (25 mg total) by mouth daily. 07/20/18 10/02/18 Yes Pyreddy, Vivien Rota, MD  metoprolol succinate (TOPROL-XL) 25 MG 24 hr tablet Take 1 tablet (25 mg total) by mouth daily. Take with or immediately following a meal. 07/20/18 10/02/18 Yes Pyreddy, Pavan, MD  mirtazapine (REMERON SOL-TAB) 30 MG disintegrating tablet Take 30 mg by mouth daily.  03/18/18  Yes [provider]  montelukast (SINGULAIR) 10 MG tablet Take 1 tablet (10 mg total) by mouth daily. 07/21/18 10/02/18 Yes Shane Crutch, MD  Multiple Vitamin (MULTIVITAMIN) tablet Take 1 tablet by mouth daily.   Yes [provider]  omeprazole (PRILOSEC) 40 MG capsule Take 1 capsule (40 mg total) by mouth daily. 1 hr before supper 04/05/17  Yes  Henrene Dodge, MD  Potassium Chloride ER 20 MEQ TBCR Take 20 mEq by mouth daily. Take with lasix 09/30/18  Yes [provider]  pravastatin (PRAVACHOL) 80 MG tablet Take 1 tablet (80 mg total) by mouth daily. 10/17/17 06/04/19 Yes Johnson, Clanford L, MD  pregabalin (LYRICA) 150 MG capsule Take 150 mg by mouth 3 (three) times daily.   Yes [provider]  sitaGLIPtin (JANUVIA) 100 MG tablet Take 100 mg by mouth daily.   Yes [provider]  sucralfate (CARAFATE) 1 g tablet Take 1 g by mouth 2 (two) times daily.    Yes [provider]  SUMAtriptan (IMITREX) 25 MG tablet Take 25 mg by mouth every 2 (two) hours as needed for migraine. May repeat in 2 hours if headache persists or recurs.   Yes [provider]  tiotropium (SPIRIVA HANDIHALER) 18 MCG inhalation capsule Place 1 capsule (18 mcg total) into inhaler and inhale daily. 06/24/18 06/24/19 Yes Shane Crutch, MD  tiZANidine (ZANAFLEX) 4 MG tablet Take 4 mg by mouth every 8 (eight) hours as  needed for muscle spasms.    Yes [provider]  traMADol (ULTRAM) 50 MG tablet Take 50 mg by mouth every 6 (six) hours as needed for moderate pain.   Yes [provider]  zolpidem (AMBIEN) 10 MG tablet Take 10 mg by mouth at bedtime as needed for sleep.    Yes [provider]  flunisolide (NASALIDE) 25 MCG/ACT (0.025%) SOLN Place 2 sprays into the nose daily as needed.    [provider]  HYDROcodone-acetaminophen (NORCO/VICODIN) 5-325 MG tablet Take 1-2 tablets by mouth at bedtime as needed for moderate pain.    [provider]    Allergies Penicillins and Ace inhibitors    Social History Social History   Tobacco Use  . Smoking status: Never Smoker  . Smokeless tobacco: Never Used  Substance Use Topics  . Alcohol use: No  . Drug use: No    Review of Systems Patient denies headaches, rhinorrhea, blurry vision, numbness, shortness of breath, chest pain, edema, cough, abdominal pain, nausea, vomiting, diarrhea, dysuria, fevers, rashes or hallucinations unless otherwise stated above in HPI. ____________________________________________   PHYSICAL EXAM:  VITAL SIGNS: Vitals:   10/02/18 1414  BP: (!) 159/96  Pulse: (!) 137  Resp: (!) 24  Temp: 98.4 F (36.9 C)  SpO2: 98%    Constitutional: drowsy and encephalopathic but MAE spontaneously Eyes: Conjunctivae are normal.  Head: Atraumatic. Nose: No congestion/rhinnorhea. Mouth/Throat: Mucous membranes are moist.   Neck: No stridor. Painless ROM.  Cardiovascular: tachycardia rate, regular rhythm. Grossly normal heart sounds.  Good peripheral circulation. Respiratory: Normal respiratory effort.  No retractions. Lungs CTAB. Gastrointestinal: Soft and nontender. No distention. No abdominal bruits. No CVA tenderness. Genitourinary: Dark stool on rectal exam.  Does have nonthrombosed hemorrhoids without evidence of active hemorrhage.  Stool is strongly guaiac positive Musculoskeletal:  No lower extremity tenderness nor edema.  No joint effusions. Neurologic:  Normal speech and language. No gross focal neurologic deficits are appreciated. No facial droop Skin:  Skin is warm, dry and intact. No rash noted. Psychiatric:  Unable to assess 2/2 confusion ____________________________________________   LABS (all labs ordered are listed, but only abnormal results are displayed)  Results for orders placed or performed during the hospital encounter of 10/02/18 (from the past 24 hour(s))  Comprehensive metabolic panel     Status: Abnormal   Collection Time: 10/02/18  2:19 PM  Result Value Ref Range  Sodium 138 135 - 145 mmol/L   Potassium 3.3 (L) 3.5 - 5.1 mmol/L   Chloride 104 98 - 111 mmol/L   CO2 23 22 - 32 mmol/L   Glucose, Bld 329 (H) 70 - 99 mg/dL   BUN 15 6 - 20 mg/dL   Creatinine, Ser 8.18 0.44 - 1.00 mg/dL   Calcium 9.7 8.9 - 29.9 mg/dL   Total Protein 8.6 (H) 6.5 - 8.1 g/dL   Albumin 4.5 3.5 - 5.0 g/dL   AST 371 (H) 15 - 41 U/L   ALT 101 (H) 0 - 44 U/L   Alkaline Phosphatase 138 (H) 38 - 126 U/L   Total Bilirubin 0.7 0.3 - 1.2 mg/dL   GFR calc non Af Amer >60 >60 mL/min   GFR calc Af Amer >60 >60 mL/min   Anion gap 11 5 - 15  CBC     Status: None   Collection Time: 10/02/18  2:19 PM  Result Value Ref Range   WBC 6.0 4.0 - 10.5 K/uL   RBC 4.76 3.87 - 5.11 MIL/uL   Hemoglobin 14.2 12.0 - 15.0 g/dL   HCT 69.6 78.9 - 38.1 %   MCV 90.5 80.0 - 100.0 fL   MCH 29.8 26.0 - 34.0 pg   MCHC 32.9 30.0 - 36.0 g/dL   RDW 01.7 51.0 - 25.8 %   Platelets 300 150 - 400 K/uL   nRBC 0.0 0.0 - 0.2 %  Type and screen [facility]     Status: None (Preliminary result)   Collection Time: 10/02/18  2:19 PM  Result Value Ref Range   ABO/RH(D) PENDING    Antibody Screen PENDING    Sample Expiration      10/05/2018 Performed at [facility], [address] [place], [place] [address]   Blood gas, venous     Status: None   Collection Time:  10/02/18  2:36 PM  Result Value Ref Range   pH, Ven 7.35 7.250 - 7.430   pCO2, Ven 47 44.0 - 60.0 mmHg   pO2, Ven 37.0 32.0 - 45.0 mmHg   Acid-base deficit 0.2 0.0 - 2.0 mmol/L   Patient temperature 37.0    Collection site VENOUS    Sample type VENOUS   Ammonia     Status: Abnormal   Collection Time: 10/02/18  2:36 PM  Result Value Ref Range   Ammonia 42 (H) 9 - 35 umol/L  Lactic acid, plasma     Status: Abnormal   Collection Time: 10/02/18  2:51 PM  Result Value Ref Range   Lactic Acid, Venous 3.3 (HH) 0.5 - 1.9 mmol/L  Protime-INR     Status: None   Collection Time: 10/02/18  2:51 PM  Result Value Ref Range   Prothrombin Time 12.5 11.4 - 15.2 seconds   INR 0.94    ____________________________________________  EKG My review and personal interpretation at Time: 14:16   Indication: tachycardia  Rate: 140  Rhythm: sinus Axis: normal Other: normal intervals, no stemi ____________________________________________  RADIOLOGY  I personally reviewed all radiographic images ordered to evaluate for the above acute complaints and reviewed radiology reports and findings.  These findings were personally discussed with the patient.  Please see medical record for radiology report.  ____________________________________________   PROCEDURES  Procedure(s) performed:  .Critical Care Performed by: Willy Eddy, MD Authorized by: Willy Eddy, MD   Critical care provider statement:    Critical care time (minutes):  30   Critical care time  was exclusive of:  Separately billable procedures and treating other patients   Critical care was necessary to treat or prevent imminent or life-threatening deterioration of the following conditions:  Dehydration   Critical care was time spent personally by me on the following activities:  Development of treatment plan with patient or surrogate, discussions with consultants, evaluation of patient's response to treatment, examination of patient,  obtaining history from patient or surrogate, ordering and performing treatments and interventions, ordering and review of laboratory studies, ordering and review of radiographic studies, pulse oximetry, re-evaluation of patient's condition and review of old charts      Critical Care performed: yes ____________________________________________   INITIAL IMPRESSION / ASSESSMENT AND PLAN / ED COURSE  Pertinent labs & imaging results that were available during my care of the patient were reviewed by me and considered in my medical decision making (see chart for details).   DDX: ugib, lgib, hemorrhoids, liver failure, hepatitis, hyperammonemia  Roxy HorsemanLeomia W Dan HumphreysWalker is a 53 y.o. who presents to the ED with chief complaint as described above.  Patient drowsy and confused but able to follow commands.  Appears primarily metabolic.  Blood will be sent for the above differential.  Tachycardic but not hypotensive.  Her abdominal exam otherwise soft and benign.  Denies any history of liver disease however hepatic encephalopathy on the differential which could relate the 2 present during symptoms.  No report of trauma.  Will give gentle IV hydration given her history of heart failure as well as continue cardiac monitoring.  CT imaging of the head will be ordered to evaluate for altered mental status and confusion as well as chest x-ray to evaluate for pneumonia that could also be causing her presentation.  Clinical Course as of Oct 03 1543  Fri Oct 02, 2018  1517 Patient with persistent tachycardia and does have dark-colored stool that are guaiac positive.  Will start on Protonix.  Abdominal exam soft and benign at this time.  Will admit to the hospitalist for observation of hemoglobins as well as further medical management.   [PR]    Clinical Course User Index [PR] Willy Eddyobinson, Madyx Delfin, MD     As part of my medical decision making, I reviewed the following data within the electronic MEDICAL RECORD NUMBER Nursing  notes reviewed and incorporated, Labs reviewed, notes from prior ED visits and [place] Controlled Substance Database   ____________________________________________   FINAL CLINICAL IMPRESSION(S) / ED DIAGNOSES  Final diagnoses:  Gastrointestinal hemorrhage, unspecified gastrointestinal hemorrhage type      NEW MEDICATIONS STARTED DURING THIS VISIT:  New Prescriptions   No medications on file     Note:  This document was prepared using Dragon voice recognition software and may include unintentional dictation errors.    Willy Eddyobinson, Lora Glomski, MD 10/02/18 [phone]

## 2018-10-03 DIAGNOSIS — K625 Hemorrhage of anus and rectum: Secondary | ICD-10-CM | POA: Diagnosis not present

## 2018-10-03 DIAGNOSIS — R945 Abnormal results of liver function studies: Secondary | ICD-10-CM

## 2018-10-03 LAB — GLUCOSE, CAPILLARY
Glucose-Capillary: 299 mg/dL — ABNORMAL HIGH (ref 70–99)
Glucose-Capillary: 310 mg/dL — ABNORMAL HIGH (ref 70–99)
Glucose-Capillary: 275 mg/dL — ABNORMAL HIGH (ref 70–99)
Glucose-Capillary: 229 mg/dL — ABNORMAL HIGH (ref 70–99)

## 2018-10-03 LAB — COMPREHENSIVE METABOLIC PANEL
ALT: 86 U/L — ABNORMAL HIGH (ref 0–44)
AST: 75 U/L — ABNORMAL HIGH (ref 15–41)
Albumin: 3.6 g/dL (ref 3.5–5.0)
Alkaline Phosphatase: 106 U/L (ref 38–126)
Anion gap: 7 (ref 5–15)
BUN: 9 mg/dL (ref 6–20)
CO2: 23 mmol/L (ref 22–32)
Calcium: 8.3 mg/dL — ABNORMAL LOW (ref 8.9–10.3)
Chloride: 109 mmol/L (ref 98–111)
Creatinine, Ser: 0.64 mg/dL (ref 0.44–1.00)
GFR calc Af Amer: >60 mL/min (ref >60)
GFR calc non Af Amer: >60 mL/min (ref >60)
Glucose, Bld: 252 mg/dL — ABNORMAL HIGH (ref 70–99)
Potassium: 3.1 mmol/L — ABNORMAL LOW (ref 3.5–5.1)
Sodium: 139 mmol/L (ref 135–145)
Total Bilirubin: 0.7 mg/dL (ref 0.3–1.2)
Total Protein: 6.4 g/dL — ABNORMAL LOW (ref 6.5–8.1)

## 2018-10-03 LAB — CBC
HCT: 38 % (ref 36.0–46.0)
Hemoglobin: 12.1 g/dL (ref 12.0–15.0)
MCH: 29.5 pg (ref 26.0–34.0)
MCHC: 31.8 g/dL (ref 30.0–36.0)
MCV: 92.7 fL (ref 80.0–100.0)
Platelets: 242 10*3/uL (ref 150–400)
RBC: 4.1 MIL/uL (ref 3.87–5.11)
RDW: 14.6 % (ref 11.5–15.5)
WBC: 4.4 10*3/uL (ref 4.0–10.5)
nRBC: 0 % (ref 0.0–0.2)

## 2018-10-03 MED ORDER — METOPROLOL TARTRATE 25 MG PO TABS
25.0000 mg | ORAL_TABLET | Freq: Two times a day (BID) | ORAL | Status: DC
  Administered 2018-10-03 – 2018-10-06 (×6): 25 mg via ORAL
  Filled 2018-10-03 (×6): qty 1

## 2018-10-03 MED ORDER — INSULIN DETEMIR 100 UNIT/ML ~~LOC~~ SOLN
10.0000 [IU] | Freq: Every day | SUBCUTANEOUS | Status: DC
  Administered 2018-10-03 – 2018-10-04 (×2): 10 [IU] via SUBCUTANEOUS
  Filled 2018-10-03 (×2): qty 0.1

## 2018-10-03 MED ORDER — POTASSIUM CHLORIDE CRYS ER 20 MEQ PO TBCR
20.0000 meq | EXTENDED_RELEASE_TABLET | Freq: Two times a day (BID) | ORAL | Status: DC
  Administered 2018-10-03 – 2018-10-05 (×5): 20 meq via ORAL
  Filled 2018-10-03 (×5): qty 1

## 2018-10-03 NOTE — Progress Notes (Signed)
[employer] [facility] - [place] at [facility]   PATIENT NAME: Amanda [name]    MR#:  [id number]  DATE OF BIRTH:  03-23-1966  SUBJECTIVE:  CHIEF COMPLAINT:   Chief Complaint  Patient presents with  . Weakness  . Rectal Bleeding  . Shortness of Breath   Came with GI bleed.  Continue to have some blood with her stool.  Hemoglobin stable.  Denies any complaints other than just mild discomfort in lower abdomen. REVIEW OF SYSTEMS:  CONSTITUTIONAL: No fever, fatigue or weakness.  EYES: No blurred or double vision.  EARS, NOSE, AND THROAT: No tinnitus or ear pain.  RESPIRATORY: No cough, shortness of breath, wheezing or hemoptysis.  CARDIOVASCULAR: No chest pain, orthopnea, edema.  GASTROINTESTINAL: No nausea, vomiting, diarrhea or mild abdominal pain.  GENITOURINARY: No dysuria, hematuria.  ENDOCRINE: No polyuria, nocturia,  HEMATOLOGY: No anemia, easy bruising or bleeding SKIN: No rash or lesion. MUSCULOSKELETAL: No joint pain or arthritis.   NEUROLOGIC: No tingling, numbness, weakness.  PSYCHIATRY: No anxiety or depression.   ROS  DRUG ALLERGIES:   Allergies  Allergen Reactions  . Penicillins Anaphylaxis    Has patient had a PCN reaction causing immediate rash, facial/tongue/throat swelling, SOB or lightheadedness with hypotension: Yes Has patient had a PCN reaction causing severe rash involving mucus membranes or skin necrosis: No Has patient had a PCN reaction that required hospitalization No Has patient had a PCN reaction occurring within the last 10 years: No If all of the above answers are "NO", then may proceed with Cephalosporin use.   . Ace Inhibitors Swelling    VITALS:  Blood pressure (!) 159/103, pulse (!) 108, temperature 97.6 F (36.4 C), temperature source Oral, resp. rate 19, height 5\' 5"  (1.651 m), weight 88.9 kg, SpO2 93 %.  PHYSICAL EXAMINATION:  GENERAL:  53 y.o.-year-old patient lying in the bed with no acute distress.  EYES: Pupils equal,  round, reactive to light and accommodation. No scleral icterus. Extraocular muscles intact.  HEENT: Head atraumatic, normocephalic. Oropharynx and nasopharynx clear.  NECK:  Supple, no jugular venous distention. No thyroid enlargement, no tenderness.  LUNGS: Normal breath sounds bilaterally, no wheezing, rales,rhonchi or crepitation. No use of accessory muscles of respiration.  CARDIOVASCULAR: S1, S2 normal. No murmurs, rubs, or gallops.  ABDOMEN: Soft, mild right lower quadrant tender, nondistended. Bowel sounds present. No organomegaly or mass.  EXTREMITIES: No pedal edema, cyanosis, or clubbing.  NEUROLOGIC: Cranial nerves II through XII are intact. Muscle strength 5/5 in all extremities. Sensation intact. Gait not checked.  PSYCHIATRIC: The patient is alert and oriented x 3.  SKIN: No obvious rash, lesion, or ulcer.   Physical Exam LABORATORY PANEL:   CBC Recent Labs  Lab 10/03/18 0509  WBC 4.4  HGB 12.1  HCT 38.0  PLT 242   ------------------------------------------------------------------------------------------------------------------  Chemistries  Recent Labs  Lab 10/03/18 0509  NA 139  K 3.1*  CL 109  CO2 23  GLUCOSE 252*  BUN 9  CREATININE 0.64  CALCIUM 8.3*  AST 75*  ALT 86*  ALKPHOS 106  BILITOT 0.7   ------------------------------------------------------------------------------------------------------------------  Cardiac Enzymes No results for input(s): TROPONINI in the last 168 hours. ------------------------------------------------------------------------------------------------------------------  RADIOLOGY:  Ct Head Wo Contrast  Result Date: 10/02/2018 CLINICAL DATA:  Altered level of consciousness. EXAM: CT HEAD WITHOUT CONTRAST TECHNIQUE: Contiguous axial images were obtained from the base of the skull through the vertex without intravenous contrast. COMPARISON:  CT scan of October 14, 2017. FINDINGS: Brain: No evidence of acute infarction,  hemorrhage, hydrocephalus, extra-axial collection or mass lesion/mass effect. Vascular: No hyperdense vessel or unexpected calcification. Skull: Normal. Negative for fracture or focal lesion. Sinuses/Orbits: No acute finding. Other: None. IMPRESSION: Normal head CT. Electronically Signed   By: Lupita Raider, M.D.   On: 10/02/2018 15:13   Dg Chest Portable 1 View  Result Date: 10/02/2018 CLINICAL DATA:  Shortness of breath, weakness and gastrointestinal bleeding over the last week. EXAM: PORTABLE CHEST 1 VIEW COMPARISON:  07/17/2018 FINDINGS: The heart size and mediastinal contours are within normal limits. Both lungs are clear. The visualized skeletal structures are unremarkable. IMPRESSION: No active disease. Electronically Signed   By: Paulina Fusi M.D.   On: 10/02/2018 15:01   US Abdomen Limited Ruq  Result Date: 10/02/2018 CLINICAL DATA:  Abnormal liver function tests. History of a cholecystectomy. EXAM: ULTRASOUND ABDOMEN LIMITED RIGHT UPPER QUADRANT COMPARISON:  04/02/2017 FINDINGS: Gallbladder: Surgically absent Common bile duct: Diameter: 5 mm Liver: Increased parenchymal echogenicity. No mass or focal lesion. Portal vein is patent on color Doppler imaging with normal direction of blood flow towards the liver. IMPRESSION: 1. No acute findings. 2. Hepatic steatosis. Electronically Signed   By: Amie Portland M.D.   On: 10/02/2018 16:43    ASSESSMENT AND PLAN:   Active Problems:   GI bleed  53 y.o. female with a known history of diabetes, fibromyalgia, GERD, hypertension, anxiety, depression, struct of sleep apnea, history of previous CVA, asthma who presents to the hospital due to multiple episodes of rectal bleeding, generalized weakness and shortness of breath.  1.  GI bleed- suspected to be a lower GI bleed given patient's multiple episodes of rectal bleeding.  Patient does have a history of hemorrhoids.  As per the ER physician the rectal exam was maroon stool which was heme  positive. -Hemoglobin currently stable, on IV fluids, follow hemoglobin.    Awaited gastroenterology consult. -Patient is not on any blood thinners. -Give Protonix.  2.  Tachycardia-patient has sinus tachycardia with heart rates in the 120s to 130s.  I suspect this is secondary to dehydration from the GI bleed.  Continue IV fluid hydration, follow heart rate. Started on small dose metoprolol as blood pressure was also elevated.  3.  Shortness of breath-etiology unclear, patient is not hypoxic.  She is tachycardic.  No clinical evidence of congestive heart failure. - will cont. To monitor.  Chest x-ray clear.  4.  Diabetes type 2 without complication-hold patient's schedule Levemir, Invokana. -We will place patient on sliding scale insulin for now.  5.  Essential hypertension-hold losartan, Imdur, Lasix.  Continue low-dose Toprol for now.  6.  Diabetic neuropathy-continue gabapentin, Lyrica.  7.  Anxiety/depression-continue Abilify, Cymbalta, Klonopin.  8.  Abnormal LFTs-etiology unclear presently.  Will follow LFTs, no abnormalities on right upper quadrant ultrasound.  9.  Hyperlipidemia-continue Pravachol.  10.  GERD-continue Protonix.    All the records are reviewed and case discussed with Care Management/Social Workerr. Management plans discussed with the patient, family and they are in agreement.  CODE STATUS: Full.  TOTAL TIME TAKING CARE OF THIS PATIENT: 35 minutes.    POSSIBLE D/C IN 1-2 DAYS, DEPENDING ON CLINICAL CONDITION.   Altamese Dilling M.D on 10/03/2018   Between 7am to 6pm - Pager - [phone]  After 6pm go to [email or web address] - Social research officer, government  [facility]  Office  [phone]  CC: Primary care physician; Emogene Morgan, MD  Note: This dictation was prepared with Dragon dictation along with smaller phrase technology. Any  transcriptional errors that result from this process are unintentional.

## 2018-10-03 NOTE — Consult Note (Signed)
Amanda [name] , MD [address], [place], [place], [address] [address], [address], [place], [place], [address] Phone: [phone]  Fax: [phone]  Consultation  Referring Provider:   Dr Cherlynn Kaiser  Primary Care Physician:  Emogene Morgan, MD Primary Gastroenterologist:  [facility]          Reason for Consultation:     Rectal bleeding   Date of Admission:  10/02/2018 Date of Consultation:  10/03/2018         HPI:   SADAE ARRAZOLA is a 53 y.o. female is a patient of cannot recheck.  Last seen at their office in the L 2019 for dysphagia.  She has chronic constipation.  Previously deemed high risk for procedures based on the status.  Last colonoscopy in July 2018 by Dr. Mechele Collin.  It showed no hemorrhoids and 1 minute of polyp.  Diverticulosis also noted.  ED at that point of time also showed gastritis otherwise was normal.  She has been treated for hemorrhoids with vaginal suppositories and upper 2018.  She presented to the hospital with shortness of breath weakness and rectal bleeding.  Not on any blood thinners.  On admission hemoglobin of 10.2 g.  Found to have elevated liver function tests with AST of 117 ALT 101 and alkaline phosphatase of 138.  Glucose of 329.  Patient was short of breath at that point regarding stabilization of short of breath.  This morning hemoglobin 12.1 g.  Very similar nature to hemoglobin of 12.9 x 2 months back.  Neuro functio review of n tests have declined overnight with an AST of 25 and ALT of 80.  Right upper quadrant ultrasound showed only hepatic steatosis.  A CT was performed for alteration of consciousness and showed no gross abnormality. Chest x-ray showed no acute process.  He states that at present she can only walk to the restroom before she gets short of breath, finds it hard to sleep at night flat, does wake up in the night short of breath.  She also says she has been having some chest tightness.  She says she has been having rectal  bleeding for the past week every time she urinates or has a bowel movement which is bright red in color.  I looked into her toilet bowl and I did see bright red discoloration of the water.  Denies any pain.  Denies any use of NSAIDs or blood thinners.  BP (!) 159/103 (BP Location: Left Arm)   Pulse (!) 108   Temp 97.6 F (36.4 C) (Oral)   Resp 19   Ht 5\' 5"  (1.651 m)   Wt 88.9 kg   SpO2 93%   BMI 32.62 kg/m    Past Medical History:  Diagnosis Date  . Arthritis    knees, shoulder, upper back  . Asthma   . CHF (congestive heart failure) (HCC)   . Diabetes mellitus without complication (HCC)   . Environmental allergies   . Fibromyalgia   . GERD (gastroesophageal reflux disease)   . Headache    migraines - 5x/mo  . Hypertension   . Hypokalemia 10/14/2017  . Migraines   . Motion sickness    ships  . Sleep apnea   . Stroke [facility])    no residual deficits  . Thyroid nodule    bilateral and goiter  . Wears contact lenses   . Wears dentures    partial upper    Past Surgical History:  Procedure Laterality Date  .  ABDOMINAL HYSTERECTOMY  2000's  . ABDOMINAL SURGERY     laparoscopy x4 with lysis of adhesions  . APPENDECTOMY  1986  . CESAREAN SECTION  1992  . CHOLECYSTECTOMY N/A 04/03/2017   Procedure: LAPAROSCOPIC CHOLECYSTECTOMY;  Surgeon: Henrene DodgePiscoya, Jose, MD;  Location: [facility] ORS;  Service: General;  Laterality: N/A;  . COLONOSCOPY WITH PROPOFOL N/A 03/10/2017   Procedure: COLONOSCOPY WITH PROPOFOL;  Surgeon: Scot JunElliott, Robert T, MD;  [facility];  Service: Endoscopy;  Laterality: N/A;  . ECTOPIC PREGNANCY SURGERY  2000's  . ESOPHAGOGASTRODUODENOSCOPY (EGD) WITH PROPOFOL N/A 03/10/2017   Procedure: ESOPHAGOGASTRODUODENOSCOPY (EGD) WITH PROPOFOL;  Surgeon: Scot JunElliott, Robert T, MD;  [facility];  Service: Endoscopy;  Laterality: N/A;  . HERNIA REPAIR    . JOINT REPLACEMENT Right 12/16/12   knee- medial - makoplasty  . KNEE ARTHROSCOPY Right 2006   partial medial  and lateral meniscectomies  . KNEE ARTHROSCOPY Right 10/06/2015   Procedure: RIGHT KNEE ARTHROSCOPY WITH DEBRIDEMENT;  Surgeon: Erin SonsHarold Kernodle, MD;  [facility];  Service: Orthopedics;  Laterality: Right;  Diabetic - insulin and oral meds  . ROTATOR CUFF REPAIR Left 2006  . TUBAL LIGATION  2000's    Prior to Admission medications   Medication Sig Start Date End Date Taking? Authorizing Provider  albuterol (PROVENTIL HFA) 108 (90 Base) MCG/ACT inhaler Inhale 2 puffs into the lungs every 4 (four) hours as needed for wheezing or shortness of breath. 07/21/18  Yes Shane Crutchamachandran, Pradeep, MD  ARIPiprazole (ABILIFY) 2 MG tablet Take 2 mg by mouth daily.    Yes [provider]  Ascorbic Acid (VITAMIN C) 1000 MG tablet Take 500 mg by mouth 2 (two) times daily.    Yes [provider]  BREO ELLIPTA 200-25 MCG/INH AEPB Inhale 1 puff into the lungs daily. Patient taking differently: Inhale 1 puff into the lungs daily.  04/23/18  Yes Shane Crutchamachandran, Pradeep, MD  canagliflozin (INVOKANA) 300 MG TABS tablet Take 300 mg by mouth daily before breakfast.   Yes [provider]  clonazePAM (KLONOPIN) 0.5 MG tablet Take 0.5 mg by mouth 2 (two) times daily.   Yes [provider]  DULoxetine (CYMBALTA) 60 MG capsule Take 60 mg by mouth 2 (two) times daily.   Yes [provider]  furosemide (LASIX) 20 MG tablet Take 1 tablet (20 mg total) by mouth daily. 07/20/18 10/02/18 Yes Pyreddy, Vivien RotaPavan, MD  gabapentin (NEURONTIN) 300 MG capsule Take 600 mg by mouth 2 (two) times daily. 03/22/18  Yes [provider]  insulin detemir (LEVEMIR) 100 UNIT/ML injection Inject 12-20 Units into the skin at bedtime.    Yes [provider]  ipratropium-albuterol (DUONEB) 0.5-2.5 (3) MG/3ML SOLN Inhale contents of 1 vial via nebulizer 3 times daily as needed for shortness of breath/wheeze/cough. Patient taking differently: Inhale 3 mLs into the lungs 3 (three) times  daily as needed (shortness of breath, wheezing or cough).  11/21/17  Yes Shane Crutchamachandran, Pradeep, MD  isosorbide mononitrate (IMDUR) 30 MG 24 hr tablet Take 1 tablet (30 mg total) by mouth daily. 10/17/17  Yes Johnson, Clanford L, MD  lidocaine (LIDODERM) 5 % Place 1 patch onto the skin daily. (remove after 12 hours and apply new patch 12 hours later) 02/24/18  Yes [provider]  LINZESS 290 MCG CAPS capsule Take 1 tablet by mouth daily before breakfast.  02/28/17  Yes [provider]  losartan (COZAAR) 25 MG tablet Take 1 tablet (25 mg total) by mouth daily. 07/20/18 10/02/18 Yes Pyreddy,  Vivien Rota, MD  metoprolol succinate (TOPROL-XL) 25 MG 24 hr tablet Take 1 tablet (25 mg total) by mouth daily. Take with or immediately following a meal. 07/20/18 10/02/18 Yes Pyreddy, Pavan, MD  mirtazapine (REMERON SOL-TAB) 30 MG disintegrating tablet Take 30 mg by mouth daily.  03/18/18  Yes [provider]  montelukast (SINGULAIR) 10 MG tablet Take 1 tablet (10 mg total) by mouth daily. 07/21/18 10/02/18 Yes Shane Crutch, MD  Multiple Vitamin (MULTIVITAMIN) tablet Take 1 tablet by mouth daily.   Yes [provider]  omeprazole (PRILOSEC) 40 MG capsule Take 1 capsule (40 mg total) by mouth daily. 1 hr before supper 04/05/17  Yes Piscoya, Jose, MD  Potassium Chloride ER 20 MEQ TBCR Take 20 mEq by mouth daily. Take with lasix 09/30/18  Yes [provider]  pravastatin (PRAVACHOL) 80 MG tablet Take 1 tablet (80 mg total) by mouth daily. 10/17/17 06/04/19 Yes Johnson, Clanford L, MD  pregabalin (LYRICA) 150 MG capsule Take 150 mg by mouth 3 (three) times daily.   Yes [provider]  sitaGLIPtin (JANUVIA) 100 MG tablet Take 100 mg by mouth daily.   Yes [provider]  sucralfate (CARAFATE) 1 g tablet Take 1 g by mouth 2 (two) times daily.    Yes [provider]  SUMAtriptan (IMITREX) 25 MG tablet Take 25 mg by mouth every 2 (two) hours as needed for  migraine. May repeat in 2 hours if headache persists or recurs.   Yes [provider]  tiotropium (SPIRIVA HANDIHALER) 18 MCG inhalation capsule Place 1 capsule (18 mcg total) into inhaler and inhale daily. 06/24/18 06/24/19 Yes Shane Crutch, MD  tiZANidine (ZANAFLEX) 4 MG tablet Take 4 mg by mouth every 8 (eight) hours as needed for muscle spasms.    Yes [provider]  traMADol (ULTRAM) 50 MG tablet Take 50 mg by mouth every 6 (six) hours as needed for moderate pain.   Yes [provider]  zolpidem (AMBIEN) 10 MG tablet Take 10 mg by mouth at bedtime as needed for sleep.    Yes [provider]  flunisolide (NASALIDE) 25 MCG/ACT (0.025%) SOLN Place 2 sprays into the nose daily as needed.    [provider]  HYDROcodone-acetaminophen (NORCO/VICODIN) 5-325 MG tablet Take 1-2 tablets by mouth at bedtime as needed for moderate pain.    [provider]    Family History  Problem Relation Age of Onset  . Hypertension Mother   . Hypertension Father   . Diabetes Father   . Allergies Father   . Kidney disease Father   . Breast cancer Neg Hx      Social History   Tobacco Use  . Smoking status: Never Smoker  . Smokeless tobacco: Never Used  Substance Use Topics  . Alcohol use: No  . Drug use: No    Allergies as of 10/02/2018 - Review Complete 10/02/2018  Allergen Reaction Noted  . Penicillins Anaphylaxis 05/28/2015  . Ace inhibitors Swelling 05/28/2015    Review of Systems:    All systems reviewed and negative except where noted in HPI.   Physical Exam:  Vital signs in last 24 hours: Temp:  [97.6 F (36.4 C)-99.4 F (37.4 C)] 97.6 F (36.4 C) (02/01 1120) Pulse Rate:  [75-137] 108 (02/01 1120) Resp:  [17-24] 19 (02/01 0456) BP: (101-162)/(59-105) 159/103 (02/01 1120) SpO2:  [93 %-100 %] 93 % (02/01 1120) Weight:  [88.9 kg] 88.9 kg (01/31 1415) Last BM Date: 10/02/18 General:   Pleasant,  cooperative in NAD Head:   Normocephalic and atraumatic. Eyes:   No icterus.   Conjunctiva pink. PERRLA. Ears:  Normal auditory acuity. Neck:  Supple; no masses or thyroidomegaly Lungs: Respirations even and unlabored. Lungs clear to auscultation bilaterally.   No wheezes, crackles, or rhonchi.  Heart:  Regular rate and rhythm;  Without murmur, clicks, rubs or gallops Abdomen:  Soft, nondistended, nontender. Normal bowel sounds. No appreciable masses or hepatomegaly.  No rebound or guarding.  Neurologic:  Alert and oriented x3;  grossly normal neurologically. Skin:  Intact without significant lesions or rashes. Cervical Nodes:  No significant cervical adenopathy. Psych:  Alert and cooperative. Normal affect.  LAB RESULTS: Recent Labs    10/02/18 1419 10/03/18 0509  WBC 6.0 4.4  HGB 14.2 12.1  HCT 43.1 38.0  PLT 300 242   BMET Recent Labs    10/02/18 1419 10/03/18 0509  NA 138 139  K 3.3* 3.1*  CL 104 109  CO2 23 23  GLUCOSE 329* 252*  BUN 15 9  CREATININE 0.80 0.64  CALCIUM 9.7 8.3*   LFT Recent Labs    10/03/18 0509  PROT 6.4*  ALBUMIN 3.6  AST 75*  ALT 86*  ALKPHOS 106  BILITOT 0.7   PT/INR Recent Labs    10/02/18 1451  LABPROT 12.5  INR 0.94    STUDIES: Ct Head Wo Contrast  Result Date: 10/02/2018 CLINICAL DATA:  Altered level of consciousness. EXAM: CT HEAD WITHOUT CONTRAST TECHNIQUE: Contiguous axial images were obtained from the base of the skull through the vertex without intravenous contrast. COMPARISON:  CT scan of October 14, 2017. FINDINGS: Brain: No evidence of acute infarction, hemorrhage, hydrocephalus, extra-axial collection or mass lesion/mass effect. Vascular: No hyperdense vessel or unexpected calcification. Skull: Normal. Negative for fracture or focal lesion. Sinuses/Orbits: No acute finding. Other: None. IMPRESSION: Normal head CT. Electronically Signed   By: Lupita RaiderJames  Green Jr, M.D.   On: 10/02/2018 15:13   Dg Chest Portable 1 View  Result Date:  10/02/2018 CLINICAL DATA:  Shortness of breath, weakness and gastrointestinal bleeding over the last week. EXAM: PORTABLE CHEST 1 VIEW COMPARISON:  07/17/2018 FINDINGS: The heart size and mediastinal contours are within normal limits. Both lungs are clear. The visualized skeletal structures are unremarkable. IMPRESSION: No active disease. Electronically Signed   By: Paulina FusiMark  Shogry M.D.   On: 10/02/2018 15:01   [place] Abdomen Limited Ruq  Result Date: 10/02/2018 CLINICAL DATA:  Abnormal liver function tests. History of a cholecystectomy. EXAM: ULTRASOUND ABDOMEN LIMITED RIGHT UPPER QUADRANT COMPARISON:  04/02/2017 FINDINGS: Gallbladder: Surgically absent Common bile duct: Diameter: 5 mm Liver: Increased parenchymal echogenicity. No mass or focal lesion. Portal vein is patent on color Doppler imaging with normal direction of blood flow towards the liver. IMPRESSION: 1. No acute findings. 2. Hepatic steatosis. Electronically Signed   By: Amie Portlandavid  Ormond M.D.   On: 10/02/2018 16:43      Impression / Plan:   Barnabas HarriesLeomia W Walker is a 53 y.o. y/o female with a known history of internal hemorrhoids and a colonoscopy which demonstrated diverticulosis as well as internal hemorrhoids in 2018 by Dr. Mechele CollinElliott presents to the emergency room with rectal bleeding shortness of breath.  The hemoglobin is at baseline with no change.  Even overnight hemoglobin is still within normal limits.  On admission was noted to have a transient elevation in liver function tests.  Which are resolving on rechecking this morning.  Unclear of the etiology of the shortness of breath which  is unlikely to be related to rectal bleeding as she is not anemic. She gives a history of orthopnea, PND.  Plan 1.  Would not perform any endoscopic intervention at this point of time since hemoglobin is stable and the patient has issues with shortness of breath, chest pain.  Would suggest cardiac evaluation . Denies any calf pain but may also want to consider  evaluation for pulmonary embolism.  Once cleared from the cardiopulmonary point of view if the issue still persists then we could consider inpatient sigmoidoscopy or colonoscopy.   2.  Liver function tests acutely went up and  resolving today.  Could be from heart failure.  If related to heart failure or ischemia I would expect it to resolve rapidly.  Suggest to follow tomorrow.  She has had a cholecystectomy and a common bile duct is 5 mm..  Thank you for involving me in the care of this patient.      LOS: 0 days   Amanda Mood, MD  10/03/2018, 1:37 PM

## 2018-10-04 ENCOUNTER — Observation Stay
Admit: 2018-10-04 | Discharge: 2018-10-04 | Disposition: A | Discharge status: Home / Self Care | Payer: 59 | Attending: Internal Medicine | Admitting: Internal Medicine

## 2018-10-04 DIAGNOSIS — K625 Hemorrhage of anus and rectum: Secondary | ICD-10-CM | POA: Diagnosis not present

## 2018-10-04 LAB — COMPREHENSIVE METABOLIC PANEL
ALT: 106 U/L — ABNORMAL HIGH (ref 0–44)
AST: 93 U/L — ABNORMAL HIGH (ref 15–41)
Albumin: 3.8 g/dL (ref 3.5–5.0)
Alkaline Phosphatase: 116 U/L (ref 38–126)
Anion gap: 6 (ref 5–15)
BUN: 6 mg/dL (ref 6–20)
CO2: 25 mmol/L (ref 22–32)
Calcium: 8.9 mg/dL (ref 8.9–10.3)
Chloride: 107 mmol/L (ref 98–111)
Creatinine, Ser: 0.72 mg/dL (ref 0.44–1.00)
GFR calc Af Amer: >60 mL/min (ref >60)
GFR calc non Af Amer: >60 mL/min (ref >60)
Glucose, Bld: 351 mg/dL — ABNORMAL HIGH (ref 70–99)
Potassium: 3.2 mmol/L — ABNORMAL LOW (ref 3.5–5.1)
Sodium: 138 mmol/L (ref 135–145)
Total Bilirubin: 0.4 mg/dL (ref 0.3–1.2)
Total Protein: 7.1 g/dL (ref 6.5–8.1)

## 2018-10-04 LAB — GLUCOSE, CAPILLARY
Glucose-Capillary: 332 mg/dL — ABNORMAL HIGH (ref 70–99)
Glucose-Capillary: 381 mg/dL — ABNORMAL HIGH (ref 70–99)
Glucose-Capillary: 302 mg/dL — ABNORMAL HIGH (ref 70–99)
Glucose-Capillary: 304 mg/dL — ABNORMAL HIGH (ref 70–99)

## 2018-10-04 LAB — ECHOCARDIOGRAM COMPLETE
Height: 65 in
Weight: 3136 oz

## 2018-10-04 LAB — CBC
HCT: 40.8 % (ref 36.0–46.0)
Hemoglobin: 13.3 g/dL (ref 12.0–15.0)
MCH: 29.6 pg (ref 26.0–34.0)
MCHC: 32.6 g/dL (ref 30.0–36.0)
MCV: 90.9 fL (ref 80.0–100.0)
Platelets: 286 10*3/uL (ref 150–400)
RBC: 4.49 MIL/uL (ref 3.87–5.11)
RDW: 14.6 % (ref 11.5–15.5)
WBC: 5.7 10*3/uL (ref 4.0–10.5)
nRBC: 0.3 % — ABNORMAL HIGH (ref 0.0–0.2)

## 2018-10-04 LAB — TROPONIN I
Troponin I: <0.03 ng/mL (ref <0.03)
Troponin I: <0.03 ng/mL (ref <0.03)
Troponin I: <0.03 ng/mL (ref <0.03)

## 2018-10-04 MED ORDER — INSULIN DETEMIR 100 UNIT/ML ~~LOC~~ SOLN
20.0000 [IU] | Freq: Every day | SUBCUTANEOUS | Status: DC
  Administered 2018-10-05 – 2018-10-06 (×2): 20 [IU] via SUBCUTANEOUS
  Filled 2018-10-04 (×3): qty 0.2

## 2018-10-04 MED ORDER — INSULIN DETEMIR 100 UNIT/ML ~~LOC~~ SOLN
10.0000 [IU] | Freq: Once | SUBCUTANEOUS | Status: AC
  Administered 2018-10-04: 10 [IU] via SUBCUTANEOUS
  Filled 2018-10-04: qty 0.1

## 2018-10-04 MED ORDER — MIRTAZAPINE 15 MG PO TBDP
30.0000 mg | ORAL_TABLET | Freq: Every day | ORAL | Status: DC
  Administered 2018-10-05: 30 mg via ORAL
  Filled 2018-10-04 (×2): qty 2

## 2018-10-04 MED ORDER — FUROSEMIDE 40 MG PO TABS
40.0000 mg | ORAL_TABLET | Freq: Every day | ORAL | Status: DC
  Administered 2018-10-04 – 2018-10-05 (×2): 40 mg via ORAL
  Filled 2018-10-04 (×2): qty 1

## 2018-10-04 NOTE — Progress Notes (Signed)
[facility] [place] [facility]   PATIENT NAME: Amanda [name]    MR#:  [id number]  DATE OF BIRTH:  10-Nov-1965  SUBJECTIVE:  CHIEF COMPLAINT:   Chief Complaint  Patient presents with  . Weakness  . Rectal Bleeding  . Shortness of Breath   Came with GI bleed.  Continue to have some blood with her stool.  Hemoglobin stable.  Denies any complaints other than just mild discomfort in lower abdomen. She had complaints of recurrent chest pain and shortness of breath with minimal exertion like walking up to her bathroom for the last few weeks. REVIEW OF SYSTEMS:  CONSTITUTIONAL: No fever, fatigue or weakness.  EYES: No blurred or double vision.  EARS, NOSE, AND THROAT: No tinnitus or ear pain.  RESPIRATORY: No cough, shortness of breath, wheezing or hemoptysis.  CARDIOVASCULAR: No chest pain, orthopnea, edema.  GASTROINTESTINAL: No nausea, vomiting, diarrhea or mild abdominal pain.  GENITOURINARY: No dysuria, hematuria.  ENDOCRINE: No polyuria, nocturia,  HEMATOLOGY: No anemia, easy bruising or bleeding SKIN: No rash or lesion. MUSCULOSKELETAL: No joint pain or arthritis.   NEUROLOGIC: No tingling, numbness, weakness.  PSYCHIATRY: No anxiety or depression.   ROS  DRUG ALLERGIES:   Allergies  Allergen Reactions  . Penicillins Anaphylaxis    Has patient had a PCN reaction causing immediate rash, facial/tongue/throat swelling, SOB or lightheadedness with hypotension: Yes Has patient had a PCN reaction causing severe rash involving mucus membranes or skin necrosis: No Has patient had a PCN reaction that required hospitalization No Has patient had a PCN reaction occurring within the last 10 years: No If all of the above answers are "NO", then may proceed with Cephalosporin use.   . Ace Inhibitors Swelling    VITALS:  Blood pressure 107/68, pulse 69, temperature 98.6 F (37 C), temperature source Oral, resp. rate 16, height 5\' 5"  (1.651 m), weight 88.9 kg, SpO2  100 %.  PHYSICAL EXAMINATION:  GENERAL:  53 y.o.-year-old patient lying in the bed with no acute distress.  EYES: Pupils equal, round, reactive to light and accommodation. No scleral icterus. Extraocular muscles intact.  HEENT: Head atraumatic, normocephalic. Oropharynx and nasopharynx clear.  NECK:  Supple, no jugular venous distention. No thyroid enlargement, no tenderness.  LUNGS: Normal breath sounds bilaterally, no wheezing, rales,rhonchi or crepitation. No use of accessory muscles of respiration.  CARDIOVASCULAR: S1, S2 normal. No murmurs, rubs, or gallops.  ABDOMEN: Soft, mild right lower quadrant tender, nondistended. Bowel sounds present. No organomegaly or mass.  EXTREMITIES: No pedal edema, cyanosis, or clubbing.  NEUROLOGIC: Cranial nerves II through XII are intact. Muscle strength 5/5 in all extremities. Sensation intact. Gait not checked.  PSYCHIATRIC: The patient is alert and oriented x 3.  SKIN: No obvious rash, lesion, or ulcer.   Physical Exam LABORATORY PANEL:   CBC Recent Labs  Lab 10/04/18 0547  WBC 5.7  HGB 13.3  HCT 40.8  PLT 286   ------------------------------------------------------------------------------------------------------------------  Chemistries  Recent Labs  Lab 10/04/18 0744  NA 138  K 3.2*  CL 107  CO2 25  GLUCOSE 351*  BUN 6  CREATININE 0.72  CALCIUM 8.9  AST 93*  ALT 106*  ALKPHOS 116  BILITOT 0.4   ------------------------------------------------------------------------------------------------------------------  Cardiac Enzymes Recent Labs  Lab 10/04/18 0744  TROPONINI <0.03   ------------------------------------------------------------------------------------------------------------------  RADIOLOGY:  Ct Head Wo Contrast  Result Date: 10/02/2018 CLINICAL DATA:  Altered level of consciousness. EXAM: CT HEAD WITHOUT CONTRAST TECHNIQUE: Contiguous axial images were obtained from the base  of the skull through the vertex  without intravenous contrast. COMPARISON:  CT scan of October 14, 2017. FINDINGS: Brain: No evidence of acute infarction, hemorrhage, hydrocephalus, extra-axial collection or mass lesion/mass effect. Vascular: No hyperdense vessel or unexpected calcification. Skull: Normal. Negative for fracture or focal lesion. Sinuses/Orbits: No acute finding. Other: None. IMPRESSION: Normal head CT. Electronically Signed   By: Lupita RaiderJames  Green Jr, M.D.   On: 10/02/2018 15:13   Dg Chest Portable 1 View  Result Date: 10/02/2018 CLINICAL DATA:  Shortness of breath, weakness and gastrointestinal bleeding over the last week. EXAM: PORTABLE CHEST 1 VIEW COMPARISON:  07/17/2018 FINDINGS: The heart size and mediastinal contours are within normal limits. Both lungs are clear. The visualized skeletal structures are unremarkable. IMPRESSION: No active disease. Electronically Signed   By: Paulina FusiMark  Shogry M.D.   On: 10/02/2018 15:01   [place] Abdomen Limited Ruq  Result Date: 10/02/2018 CLINICAL DATA:  Abnormal liver function tests. History of a cholecystectomy. EXAM: ULTRASOUND ABDOMEN LIMITED RIGHT UPPER QUADRANT COMPARISON:  04/02/2017 FINDINGS: Gallbladder: Surgically absent Common bile duct: Diameter: 5 mm Liver: Increased parenchymal echogenicity. No mass or focal lesion. Portal vein is patent on color Doppler imaging with normal direction of blood flow towards the liver. IMPRESSION: 1. No acute findings. 2. Hepatic steatosis. Electronically Signed   By: Amie Portlandavid  Ormond M.D.   On: 10/02/2018 16:43    ASSESSMENT AND PLAN:   Active Problems:   GI bleed  53 y.o. female with a known history of diabetes, fibromyalgia, GERD, hypertension, anxiety, depression, struct of sleep apnea, history of previous CVA, asthma who presents to the hospital due to multiple episodes of rectal bleeding, generalized weakness and shortness of breath.  1.  GI bleed- suspected to be a lower GI bleed given patient's multiple episodes of rectal bleeding.   Patient does have a history of hemorrhoids.  As per the ER physician the rectal exam was maroon stool which was heme positive. -Hemoglobin currently stable, on IV fluids, follow hemoglobin.    Appreciated gastroenterology consult.  Need cardiology clearance and possibly sigmoidoscopy if still continue to bleed until then. -Patient is not on any blood thinners. -Give Protonix.  2.  Tachycardia-patient has sinus tachycardia with heart rates in the 120s to 130s.  I suspect this is secondary to dehydration from the GI bleed.  Continue IV fluid hydration, follow heart rate. Started on small dose metoprolol as blood pressure was also elevated.  Stable now.  3.  Shortness of breath-etiology unclear, patient is not hypoxic.  She is tachycardic.  No clinical evidence of congestive heart failure. - will cont. To monitor.  Chest x-ray clear. -She is not hypoxic or tachypneic now.  She had this complaint of shortness of breath with minimal exertion chronically. I am getting a repeat echocardiogram and calling cardiology consult.  4.  Diabetes type 2 without complication- -We will place patient on sliding scale insulin for now. -Resume Levemir and now increasing dose as with increasing oral intake her blood sugar is going higher now.  5.  Essential hypertension-hold losartan, Imdur, Lasix.  Continue low-dose Toprol for now.  6.  Diabetic neuropathy-continue gabapentin, Lyrica.  7.  Anxiety/depression-continue Abilify, Cymbalta, Klonopin.  8.  Abnormal LFTs-etiology unclear presently.  Will follow LFTs- improving, no abnormalities on right upper quadrant ultrasound.  9.  Hyperlipidemia-continue Pravachol.  10.  GERD-continue Protonix.  11.  Anginal chest pain-she follows with Dr. Welton FlakesKhan in clinic for cardiology. I will call cardiology consult as she may need to undergo  GI procedure and GI is asking for cardiac clearance due to her worsening complaint of chest pain and shortness of  breath.  All the records are reviewed and case discussed with Care Management/Social Workerr. Management plans discussed with the patient, family and they are in agreement.  CODE STATUS: Full.  TOTAL TIME TAKING CARE OF THIS PATIENT: 35 minutes.    POSSIBLE D/C IN 1-2 DAYS, DEPENDING ON CLINICAL CONDITION.   Altamese Dilling M.D on 10/04/2018   Between 7am to 6pm - Pager - [phone]  After 6pm go to [email or web address] - Social research officer, government  [facility]  Office  [phone]  CC: Primary care physician; Emogene Morgan, MD  Note: This dictation was prepared with Dragon dictation along with smaller phrase technology. Any transcriptional errors that result from this process are unintentional.

## 2018-10-04 NOTE — Progress Notes (Signed)
Order received to discontinue telemetry 

## 2018-10-05 ENCOUNTER — Observation Stay: Payer: 59

## 2018-10-05 ENCOUNTER — Encounter: Payer: Self-pay | Admitting: Radiology

## 2018-10-05 DIAGNOSIS — K921 Melena: Secondary | ICD-10-CM

## 2018-10-05 DIAGNOSIS — K625 Hemorrhage of anus and rectum: Secondary | ICD-10-CM | POA: Diagnosis not present

## 2018-10-05 LAB — GLUCOSE, CAPILLARY
Glucose-Capillary: 247 mg/dL — ABNORMAL HIGH (ref 70–99)
Glucose-Capillary: 230 mg/dL — ABNORMAL HIGH (ref 70–99)
Glucose-Capillary: 188 mg/dL — ABNORMAL HIGH (ref 70–99)
Glucose-Capillary: 291 mg/dL — ABNORMAL HIGH (ref 70–99)
Glucose-Capillary: 165 mg/dL — ABNORMAL HIGH (ref 70–99)

## 2018-10-05 LAB — TSH: TSH: 1.089 u[IU]/mL (ref 0.350–4.500)

## 2018-10-05 MED ORDER — FUROSEMIDE 40 MG PO TABS
40.0000 mg | ORAL_TABLET | Freq: Two times a day (BID) | ORAL | Status: DC
  Administered 2018-10-05: 40 mg via ORAL
  Filled 2018-10-05: qty 1

## 2018-10-05 MED ORDER — PEG 3350-KCL-NA BICARB-NACL 420 G PO SOLR
4000.0000 mL | Freq: Once | ORAL | Status: AC
  Administered 2018-10-05: 4000 mL via ORAL
  Filled 2018-10-05: qty 4000

## 2018-10-05 MED ORDER — AMLODIPINE BESYLATE 5 MG PO TABS
5.0000 mg | ORAL_TABLET | Freq: Every day | ORAL | Status: DC
  Administered 2018-10-05: 5 mg via ORAL
  Filled 2018-10-05: qty 1

## 2018-10-05 MED ORDER — LOSARTAN POTASSIUM 50 MG PO TABS
100.0000 mg | ORAL_TABLET | Freq: Every day | ORAL | Status: DC
  Administered 2018-10-05: 100 mg via ORAL
  Filled 2018-10-05: qty 2

## 2018-10-05 MED ORDER — SENNOSIDES-DOCUSATE SODIUM 8.6-50 MG PO TABS
1.0000 | ORAL_TABLET | Freq: Two times a day (BID) | ORAL | Status: DC
  Administered 2018-10-05 (×2): 1 via ORAL
  Filled 2018-10-05 (×2): qty 1

## 2018-10-05 MED ORDER — IOHEXOL 350 MG/ML SOLN
75.0000 mL | Freq: Once | INTRAVENOUS | Status: AC | PRN
  Administered 2018-10-05: 75 mL via INTRAVENOUS

## 2018-10-05 NOTE — Consult Note (Signed)
Amanda [name] is a 53 y.o. female  [id number]  Primary Cardiologist: Dr. Adrian BlackwaterShaukat Khan Reason for Consultation:   HPI: 53yo female with a past medical history of GERD, DM2, CHF, sleep apnea, and hemorrhoids presented to ER with bright red blood per rectum, some shortness of breath, and notable confusion.     Review of Systems: Feeling better today. Slight shortness of breath but otherwise well.    Past Medical History:  Diagnosis Date  . Arthritis    knees, shoulder, upper back  . Asthma   . CHF (congestive heart failure) (HCC)   . Diabetes mellitus without complication (HCC)   . Environmental allergies   . Fibromyalgia   . GERD (gastroesophageal reflux disease)   . Headache    migraines - 5x/mo  . Hypertension   . Hypokalemia 10/14/2017  . Migraines   . Motion sickness    ships  . Sleep apnea   . Stroke [facility](HCC)    no residual deficits  . Thyroid nodule    bilateral and goiter  . Wears contact lenses   . Wears dentures    partial upper    Medications Prior to Admission  Medication Sig Dispense Refill  . albuterol (PROVENTIL HFA) 108 (90 Base) MCG/ACT inhaler Inhale 2 puffs into the lungs every 4 (four) hours as needed for wheezing or shortness of breath. 3 Inhaler 1  . ARIPiprazole (ABILIFY) 2 MG tablet Take 2 mg by mouth daily.     . Ascorbic Acid (VITAMIN C) 1000 MG tablet Take 500 mg by mouth 2 (two) times daily.     [name] Kitchen. BREO ELLIPTA 200-25 MCG/INH AEPB Inhale 1 puff into the lungs daily. (Patient taking differently: Inhale 1 puff into the lungs daily. ) 60 each 4  . canagliflozin (INVOKANA) 300 MG TABS tablet Take 300 mg by mouth daily before breakfast.    . clonazePAM (KLONOPIN) 0.5 MG tablet Take 0.5 mg by mouth 2 (two) times daily.    . DULoxetine (CYMBALTA) 60 MG capsule Take 60 mg by mouth 2 (two) times daily.    . furosemide (LASIX) 20 MG tablet Take 1 tablet (20 mg total) by mouth daily. 30 tablet 0  . gabapentin (NEURONTIN) 300 MG capsule Take 600 mg by mouth 2  (two) times daily.    . insulin detemir (LEVEMIR) 100 UNIT/ML injection Inject 12-20 Units into the skin at bedtime.     [name] Kitchen. ipratropium-albuterol (DUONEB) 0.5-2.5 (3) MG/3ML SOLN Inhale contents of 1 vial via nebulizer 3 times daily as needed for shortness of breath/wheeze/cough. (Patient taking differently: Inhale 3 mLs into the lungs 3 (three) times daily as needed (shortness of breath, wheezing or cough). ) 360 mL 3  . isosorbide mononitrate (IMDUR) 30 MG 24 hr tablet Take 1 tablet (30 mg total) by mouth daily.    [name] Kitchen. lidocaine (LIDODERM) 5 % Place 1 patch onto the skin daily. (remove after 12 hours and apply new patch 12 hours later)  1  . LINZESS 290 MCG CAPS capsule Take 1 tablet by mouth daily before breakfast.     . losartan (COZAAR) 25 MG tablet Take 1 tablet (25 mg total) by mouth daily. 30 tablet 0  . metoprolol succinate (TOPROL-XL) 25 MG 24 hr tablet Take 1 tablet (25 mg total) by mouth daily. Take with or immediately following a meal. 30 tablet 0  . mirtazapine (REMERON SOL-TAB) 30 MG disintegrating tablet Take 30 mg by mouth daily.   2  . montelukast (SINGULAIR) 10 MG tablet  Take 1 tablet (10 mg total) by mouth daily. 30 tablet 1  . Multiple Vitamin (MULTIVITAMIN) tablet Take 1 tablet by mouth daily.    [name] Kitchen omeprazole (PRILOSEC) 40 MG capsule Take 1 capsule (40 mg total) by mouth daily. 1 hr before supper 30 capsule 0  . Potassium Chloride ER 20 MEQ TBCR Take 20 mEq by mouth daily. Take with lasix    . pravastatin (PRAVACHOL) 80 MG tablet Take 1 tablet (80 mg total) by mouth daily. 30 tablet 0  . pregabalin (LYRICA) 150 MG capsule Take 150 mg by mouth 3 (three) times daily.    . sitaGLIPtin (JANUVIA) 100 MG tablet Take 100 mg by mouth daily.    . sucralfate (CARAFATE) 1 g tablet Take 1 g by mouth 2 (two) times daily.     . SUMAtriptan (IMITREX) 25 MG tablet Take 25 mg by mouth every 2 (two) hours as needed for migraine. May repeat in 2 hours if headache persists or recurs.    [name] Kitchen tiotropium  (SPIRIVA HANDIHALER) 18 MCG inhalation capsule Place 1 capsule (18 mcg total) into inhaler and inhale daily. 90 capsule 2  . tiZANidine (ZANAFLEX) 4 MG tablet Take 4 mg by mouth every 8 (eight) hours as needed for muscle spasms.     . traMADol (ULTRAM) 50 MG tablet Take 50 mg by mouth every 6 (six) hours as needed for moderate pain.    [name] Kitchen zolpidem (AMBIEN) 10 MG tablet Take 10 mg by mouth at bedtime as needed for sleep.     . flunisolide (NASALIDE) 25 MCG/ACT (0.025%) SOLN Place 2 sprays into the nose daily as needed.    [name] Kitchen HYDROcodone-acetaminophen (NORCO/VICODIN) 5-325 MG tablet Take 1-2 tablets by mouth at bedtime as needed for moderate pain.       [name] Kitchen amLODipine  5 mg Oral Daily  . ARIPiprazole  2 mg Oral Daily  . clonazePAM  0.5 mg Oral BID  . DULoxetine  60 mg Oral BID  . fluticasone furoate-vilanterol  1 puff Inhalation Daily  . furosemide  40 mg Oral Daily  . gabapentin  600 mg Oral BID  . insulin aspart  0-5 Units Subcutaneous QHS  . insulin aspart  0-9 Units Subcutaneous TID WC  . insulin detemir  20 Units Subcutaneous Daily  . linaclotide  290 mcg Oral QAC breakfast  . metoprolol tartrate  25 mg Oral BID  . mirtazapine  30 mg Oral QHS  . montelukast  10 mg Oral Daily  . pantoprazole  40 mg Oral Daily  . potassium chloride  20 mEq Oral BID  . pravastatin  80 mg Oral Daily  . pregabalin  150 mg Oral TID  . sucralfate  1 g Oral BID  . tiotropium  18 mcg Inhalation Daily    Infusions:   Allergies  Allergen Reactions  . Penicillins Anaphylaxis    Has patient had a PCN reaction causing immediate rash, facial/tongue/throat swelling, SOB or lightheadedness with hypotension: Yes Has patient had a PCN reaction causing severe rash involving mucus membranes or skin necrosis: No Has patient had a PCN reaction that required hospitalization No Has patient had a PCN reaction occurring within the last 10 years: No If all of the above answers are "NO", then may proceed with  Cephalosporin use.   . Ace Inhibitors Swelling    Social History   Socioeconomic History  . Marital status: Divorced    Spouse name: Not on file  . Number of children: Not on file  .  Years of education: Not on file  . Highest education level: Not on file  Occupational History  . Not on file  Social Needs  . Financial resource strain: Not on file  . Food insecurity:    Worry: Not on file    Inability: Not on file  . Transportation needs:    Medical: Not on file    Non-medical: Not on file  Tobacco Use  . Smoking status: Never Smoker  . Smokeless tobacco: Never Used  Substance and Sexual Activity  . Alcohol use: No  . Drug use: No  . Sexual activity: Yes    Birth control/protection: Surgical  Lifestyle  . Physical activity:    Days per week: Not on file    Minutes per session: Not on file  . Stress: Not on file  Relationships  . Social connections:    Talks on phone: Not on file    Gets together: Not on file    Attends religious service: Not on file    Active member of club or organization: Not on file    Attends meetings of clubs or organizations: Not on file    Relationship status: Not on file  . Intimate partner violence:    Fear of current or ex partner: Not on file    Emotionally abused: Not on file    Physically abused: Not on file    Forced sexual activity: Not on file  Other Topics Concern  . Not on file  Social History Narrative   Lives at home with son (who is mentally ill)   Ambulates independently.    Family History  Problem Relation Age of Onset  . Hypertension Mother   . Hypertension Father   . Diabetes Father   . Allergies Father   . Kidney disease Father   . Breast cancer Neg Hx     PHYSICAL EXAM: Vitals:   10/04/18 2001 10/05/18 0535  BP: (!) 164/101 (!) 159/113  Pulse: 97 75  Resp: 20 18  Temp: 98.7 F (37.1 C) 99.1 F (37.3 C)  SpO2: 97% 97%     Intake/Output Summary (Last 24 hours) at 10/05/2018 0840 Last data filed at  10/04/2018 1851 Gross per 24 hour  Intake 840 ml  Output -  Net 840 ml    General:  Well appearing. No respiratory difficulty HEENT: normal Neck: supple. no JVD. Carotids 2+ bilat; no bruits. No lymphadenopathy or thryomegaly appreciated. Cor: PMI nondisplaced. Regular rate & rhythm. No rubs, gallops or murmurs. Lungs: clear Abdomen: soft, nontender, nondistended. No hepatosplenomegaly. No bruits or masses. Good bowel sounds. Extremities: no cyanosis, clubbing, rash, edema Neuro: alert & oriented x 3, cranial nerves grossly intact. moves all 4 extremities w/o difficulty. Affect pleasant.  ECG: Sinus tachycardia 127bpm, LAE, consider biatrial enlargement Borderline right axis deviation Borderline T abnormalities, inferior leads Baseline wander in lead(s) V1  Results for orders placed or performed during the hospital encounter of 10/02/18 (from the past 24 hour(s))  Glucose, capillary     Status: Abnormal   Collection Time: 10/04/18 11:45 AM  Result Value Ref Range   Glucose-Capillary 381 (H) 70 - 99 mg/dL   Comment 1 Notify RN   Troponin I - Now Then Q6H     Status: None   Collection Time: 10/04/18  1:28 PM  Result Value Ref Range   Troponin I <0.03 <0.03 ng/mL  Glucose, capillary     Status: Abnormal   Collection Time: 10/04/18  4:33 PM  Result Value  Ref Range   Glucose-Capillary 302 (H) 70 - 99 mg/dL   Comment 1 Notify RN   Troponin I - Now Then Q6H     Status: None   Collection Time: 10/04/18  7:39 PM  Result Value Ref Range   Troponin I <0.03 <0.03 ng/mL  Glucose, capillary     Status: Abnormal   Collection Time: 10/04/18  9:39 PM  Result Value Ref Range   Glucose-Capillary 304 (H) 70 - 99 mg/dL  Glucose, capillary     Status: Abnormal   Collection Time: 10/05/18  7:25 AM  Result Value Ref Range   Glucose-Capillary 247 (H) 70 - 99 mg/dL   Ct Angio Chest Pe W Or Wo Contrast  Result Date: 10/05/2018 CLINICAL DATA:  Shortness of breath.  Admitted for rectal bleeding.  EXAM: CT ANGIOGRAPHY CHEST WITH CONTRAST TECHNIQUE: Multidetector CT imaging of the chest was performed using the standard protocol during bolus administration of intravenous contrast. Multiplanar CT image reconstructions and MIPs were obtained to evaluate the vascular anatomy. CONTRAST:  75mL OMNIPAQUE IOHEXOL 350 MG/ML SOLN COMPARISON:  03/30/2018 FINDINGS: Cardiovascular: Normal heart size. Small to moderate pericardial effusion that is stable from prior and low-density. The fluid is preferentially along the inferior and left borders. Negative aorta. As permitted by streak and mild intermittent motion artifact there is no evidence of pulmonary embolism Mediastinum/Nodes: Negative for adenopathy Lungs/Pleura: Minimal dependent atelectasis. There is no edema, consolidation, effusion, or pneumothorax. Upper Abdomen: Marked hepatic steatosis. Musculoskeletal: No acute finding. Focal T8-9 disc narrowing and ridging that is likely buttressing a chronic right paracentral herniation Review of the MIP images confirms the above findings. IMPRESSION: 1. No evidence of pulmonary embolism or other acute finding. 2. Chronic pericardial effusion. 3. Marked hepatic steatosis. Electronically Signed   By: Marnee SpringJonathon  Watts M.D.   On: 10/05/2018 07:03     ASSESSMENT AND PLAN: Bleeding from hemorrhoids with no anemia. Cardaic testing is as follows:  Negative nuclear stress test 04/2018 and normal coronaries on CCTA 01/2016.  Echo: Pending reading.   HTN: BP elevated, 160s/110s will restart home medications of losartan at 100mg /day and lasix 40mg  BID as per home dosing.   Cleared for any necessary GI procedures from cardiac perspective.   Caroleen HammanKristin Random Dobrowski, NP-C Cell: [phone]

## 2018-10-05 NOTE — Progress Notes (Signed)
[facility] - [place] [facility]   PATIENT NAME: Terrea Krome    MR#:  [id number]  DATE OF BIRTH:  26-May-1966  SUBJECTIVE:  CHIEF COMPLAINT:   Chief Complaint  Patient presents with  . Weakness  . Rectal Bleeding  . Shortness of Breath   Came with GI bleed.  Continue to have some blood with her stool.  Hemoglobin stable.  Denies any complaints other than just mild discomfort in lower abdomen. She had complaints of recurrent chest pain and shortness of breath with minimal exertion like walking up to her bathroom for the last few weeks. Cardiology cleared for procedures.  Patient continued to have pain in rectal area and abdomen but did not had any more bleeding for last 2 to 3 days.  REVIEW OF SYSTEMS:  CONSTITUTIONAL: No fever, fatigue or weakness.  EYES: No blurred or double vision.  EARS, NOSE, AND THROAT: No tinnitus or ear pain.  RESPIRATORY: No cough, shortness of breath, wheezing or hemoptysis.  CARDIOVASCULAR: No chest pain, orthopnea, edema.  GASTROINTESTINAL: No nausea, vomiting, diarrhea or mild abdominal pain.  GENITOURINARY: No dysuria, hematuria.  ENDOCRINE: No polyuria, nocturia,  HEMATOLOGY: No anemia, easy bruising or bleeding SKIN: No rash or lesion. MUSCULOSKELETAL: No joint pain or arthritis.   NEUROLOGIC: No tingling, numbness, weakness.  PSYCHIATRY: No anxiety or depression.   ROS  DRUG ALLERGIES:   Allergies  Allergen Reactions  . Penicillins Anaphylaxis    Has patient had a PCN reaction causing immediate rash, facial/tongue/throat swelling, SOB or lightheadedness with hypotension: Yes Has patient had a PCN reaction causing severe rash involving mucus membranes or skin necrosis: No Has patient had a PCN reaction that required hospitalization No Has patient had a PCN reaction occurring within the last 10 years: No If all of the above answers are "NO", then may proceed with Cephalosporin use.   . Ace Inhibitors Swelling     VITALS:  Blood pressure (!) 162/110, pulse 77, temperature 98.5 F (36.9 C), temperature source Oral, resp. rate 16, height 5\' 5"  (1.651 m), weight 88.9 kg, SpO2 97 %.  PHYSICAL EXAMINATION:  GENERAL:  53 y.o.-year-old patient lying in the bed with no acute distress.  EYES: Pupils equal, round, reactive to light and accommodation. No scleral icterus. Extraocular muscles intact.  HEENT: Head atraumatic, normocephalic. Oropharynx and nasopharynx clear.  NECK:  Supple, no jugular venous distention. No thyroid enlargement, no tenderness.  LUNGS: Normal breath sounds bilaterally, no wheezing, rales,rhonchi or crepitation. No use of accessory muscles of respiration.  CARDIOVASCULAR: S1, S2 normal. No murmurs, rubs, or gallops.  ABDOMEN: Soft, mild right lower quadrant tender, nondistended. Bowel sounds present. No organomegaly or mass.  EXTREMITIES: No pedal edema, cyanosis, or clubbing.  NEUROLOGIC: Cranial nerves II through XII are intact. Muscle strength 5/5 in all extremities. Sensation intact. Gait not checked.  PSYCHIATRIC: The patient is alert and oriented x 3.  SKIN: No obvious rash, lesion, or ulcer.   Physical Exam LABORATORY PANEL:   CBC Recent Labs  Lab 10/04/18 0547  WBC 5.7  HGB 13.3  HCT 40.8  PLT 286   ------------------------------------------------------------------------------------------------------------------  Chemistries  Recent Labs  Lab 10/04/18 0744  NA 138  K 3.2*  CL 107  CO2 25  GLUCOSE 351*  BUN 6  CREATININE 0.72  CALCIUM 8.9  AST 93*  ALT 106*  ALKPHOS 116  BILITOT 0.4   ------------------------------------------------------------------------------------------------------------------  Cardiac Enzymes Recent Labs  Lab 10/04/18 1328 10/04/18 1939  TROPONINI <0.03 <0.03   ------------------------------------------------------------------------------------------------------------------  RADIOLOGY:  Ct Angio Chest Pe W Or Wo  Contrast  Result Date: 10/05/2018 CLINICAL DATA:  Shortness of breath.  Admitted for rectal bleeding. EXAM: CT ANGIOGRAPHY CHEST WITH CONTRAST TECHNIQUE: Multidetector CT imaging of the chest was performed using the standard protocol during bolus administration of intravenous contrast. Multiplanar CT image reconstructions and MIPs were obtained to evaluate the vascular anatomy. CONTRAST:  75mL OMNIPAQUE IOHEXOL 350 MG/ML SOLN COMPARISON:  03/30/2018 FINDINGS: Cardiovascular: Normal heart size. Small to moderate pericardial effusion that is stable from prior and low-density. The fluid is preferentially along the inferior and left borders. Negative aorta. As permitted by streak and mild intermittent motion artifact there is no evidence of pulmonary embolism Mediastinum/Nodes: Negative for adenopathy Lungs/Pleura: Minimal dependent atelectasis. There is no edema, consolidation, effusion, or pneumothorax. Upper Abdomen: Marked hepatic steatosis. Musculoskeletal: No acute finding. Focal T8-9 disc narrowing and ridging that is likely buttressing a chronic right paracentral herniation Review of the MIP images confirms the above findings. IMPRESSION: 1. No evidence of pulmonary embolism or other acute finding. 2. Chronic pericardial effusion. 3. Marked hepatic steatosis. Electronically Signed   By: Marnee SpringJonathon  Watts M.D.   On: 10/05/2018 07:03    ASSESSMENT AND PLAN:   Active Problems:   GI bleed  53 y.o. female with a known history of diabetes, fibromyalgia, GERD, hypertension, anxiety, depression, struct of sleep apnea, history of previous CVA, asthma who presents to the hospital due to multiple episodes of rectal bleeding, generalized weakness and shortness of breath.  1.  GI bleed- suspected to be a lower GI bleed given patient's multiple episodes of rectal bleeding.  Patient does have a history of hemorrhoids.  As per the ER physician the rectal exam was maroon stool which was heme positive. -Hemoglobin  currently stable, on IV fluids, follow hemoglobin.    Appreciated gastroenterology consult.   -Now patient have cardiology clearance for any GI procedures, likely procedure tomorrow. -Patient is not on any anticoagulants. -Give Protonix.  2.  Tachycardia-patient has sinus tachycardia with heart rates in the 120s to 130s.  I suspect this is secondary to dehydration from the GI bleed.  Continue IV fluid hydration, follow heart rate. Started on small dose metoprolol as blood pressure was also elevated.  Stable now.  3.  Shortness of breath-etiology unclear, patient is not hypoxic.  She is tachycardic.  No clinical evidence of congestive heart failure. - will cont. To monitor.  Chest x-ray clear. -She is not hypoxic or tachypneic now.  She had this complaint of shortness of breath with minimal exertion chronically. Still awaited report of echocardiogram. CT scan chest angiogram is negative for any acute findings.  4.  Diabetes type 2 without complication- -We will place patient on sliding scale insulin for now. -Resume Levemir and now increasing dose as with increasing oral intake her blood sugar is going higher now.  5.  Essential hypertension- As blood pressure is running high now, resume losartan, continue metoprolol and Lasix, also added amlodipine.  6.  Diabetic neuropathy-continue gabapentin, Lyrica.  7.  Anxiety/depression-continue Abilify, Cymbalta, Klonopin.  8.  Abnormal LFTs-etiology unclear presently.  Will follow LFTs- improving, no abnormalities on right upper quadrant ultrasound.  9.  Hyperlipidemia-continue Pravachol.  10.  GERD-continue Protonix.  11.  Anginal chest pain-she follows with Dr. Welton FlakesKhan in clinic for cardiology. As per cardiology, recent negative stress test in the clinic and advised no further cardiac work-up.  Patient is cleared and stabilized per cardiologist team for GI procedures.  All the records  are reviewed and case discussed with Care  Management/Social Workerr. Management plans discussed with the patient, family and they are in agreement.  CODE STATUS: Full.  TOTAL TIME TAKING CARE OF THIS PATIENT: 35 minutes.    POSSIBLE D/C IN 1-2 DAYS, DEPENDING ON CLINICAL CONDITION.   Altamese Dilling M.D on 10/05/2018   Between 7am to 6pm - Pager - [phone]  After 6pm go to [email or web address] - Social research officer, government  [facility]  Office  [phone]  CC: Primary care physician; Emogene Morgan, MD  Note: This dictation was prepared with Dragon dictation along with smaller phrase technology. Any transcriptional errors that result from this process are unintentional.

## 2018-10-05 NOTE — Care Management Note (Signed)
Case Management Note  Patient Details  Name: Amanda [name] MRN: [id number] Date of Birth: [birth year]  Subjective/Objective:               Placed in observation 1/31 for rectal bleeding. GI consulted and recommended patient would need cardiac clearance before  proceeding with any further tests- such as sigmoidoscopy or colonoscopy due to patient complaints of shortness of breath and chest pain.  Clear liquids.  Action/Plan:  Cardiology cleared 10/05/2018. Discussed continued observation with care team.  Expected Discharge Date:                  Expected Discharge Plan:     In-House Referral:     Discharge planning Services     Post Acute Care Choice:    Choice offered to:     DME Arranged:    DME Agency:     HH Arranged:    HH Agency:     Status of Service:     If discussed at [employer], dates discussed:    Additional Comments:  Eber Hong, RN 10/05/2018, 1:42 PM

## 2018-10-05 NOTE — Progress Notes (Addendum)
Cephas Darby, MD [address]  [place]  [place], [place] [address]  Main: [phone]  Fax: [phone] Pager: [phone]   Subjective: Continues to have small amount of rectal bleeding with some blood clots but less compared to yesterday.  Hemoglobin is stable.  Objective: Vital signs in last 24 hours: Vitals:   10/05/18 0535 10/05/18 0857 10/05/18 0906 10/05/18 1326  BP: (!) 159/113 (!) 175/109 (!) 168/113 (!) 162/110  Pulse: 75 84  77  Resp: 18   16  Temp: 99.1 F (37.3 C) 98 F (36.7 C)  98.5 F (36.9 C)  TempSrc: Oral Oral  Oral  SpO2: 97%   97%  Weight:      Height:       Weight change:   Intake/Output Summary (Last 24 hours) at 10/05/2018 1747 Last data filed at 10/05/2018 9629 Gross per 24 hour  Intake 240 ml  Output 500 ml  Net -260 ml     Exam: Heart:: Regular rate and rhythm, S1S2 present or without murmur or extra heart sounds Lungs: normal and clear to auscultation Abdomen: soft, nontender, normal bowel sounds   Lab Results: CBC Latest Ref Rng & Units 10/04/2018 10/03/2018 10/02/2018  WBC 4.0 - 10.5 K/uL 5.7 4.4 6.0  Hemoglobin 12.0 - 15.0 g/dL 13.3 12.1 14.2  Hematocrit 36.0 - 46.0 % 40.8 38.0 43.1  Platelets 150 - 400 K/uL 286 242 300   BMP Latest Ref Rng & Units 10/04/2018 10/03/2018 10/02/2018  Glucose 70 - 99 mg/dL 351(H) 252(H) 329(H)  BUN 6 - 20 mg/dL 6 9 15   Creatinine 0.44 - 1.00 mg/dL 0.72 0.64 0.80  Sodium 135 - 145 mmol/L 138 139 138  Potassium 3.5 - 5.1 mmol/L 3.2(L) 3.1(L) 3.3(L)  Chloride 98 - 111 mmol/L 107 109 104  CO2 22 - 32 mmol/L 25 23 23   Calcium 8.9 - 10.3 mg/dL 8.9 8.3(L) 9.7   Hepatic Function Latest Ref Rng & Units 10/04/2018 10/03/2018 10/02/2018  Total Protein 6.5 - 8.1 g/dL 7.1 6.4(L) 8.6(H)  Albumin 3.5 - 5.0 g/dL 3.8 3.6 4.5  AST 15 - 41 U/L 93(H) 75(H) 117(H)  ALT 0 - 44 U/L 106(H) 86(H) 101(H)  Alk Phosphatase 38 - 126 U/L 116 106 138(H)  Total Bilirubin 0.3 - 1.2 mg/dL 0.4 0.7 0.7  Bilirubin, Direct 0.1  - 0.5 mg/dL - - -   Micro Results: No results found for this or any previous visit (from the past 240 hour(s)). Studies/Results: Ct Angio Chest Pe W Or Wo Contrast  Result Date: 10/05/2018 CLINICAL DATA:  Shortness of breath.  Admitted for rectal bleeding. EXAM: CT ANGIOGRAPHY CHEST WITH CONTRAST TECHNIQUE: Multidetector CT imaging of the chest was performed using the standard protocol during bolus administration of intravenous contrast. Multiplanar CT image reconstructions and MIPs were obtained to evaluate the vascular anatomy. CONTRAST:  65m OMNIPAQUE IOHEXOL 350 MG/ML SOLN COMPARISON:  03/30/2018 FINDINGS: Cardiovascular: Normal heart size. Small to moderate pericardial effusion that is stable from prior and low-density. The fluid is preferentially along the inferior and left borders. Negative aorta. As permitted by streak and mild intermittent motion artifact there is no evidence of pulmonary embolism Mediastinum/Nodes: Negative for adenopathy Lungs/Pleura: Minimal dependent atelectasis. There is no edema, consolidation, effusion, or pneumothorax. Upper Abdomen: Marked hepatic steatosis. Musculoskeletal: No acute finding. Focal T8-9 disc narrowing and ridging that is likely buttressing a chronic right paracentral herniation Review of the MIP images confirms the above findings. IMPRESSION: 1. No evidence of pulmonary embolism  or other acute finding. 2. Chronic pericardial effusion. 3. Marked hepatic steatosis. Electronically Signed   By: Monte Fantasia M.D.   On: 10/05/2018 07:03   Medications:  I have reviewed the patient's current medications. Prior to Admission:  Medications Prior to Admission  Medication Sig Dispense Refill Last Dose  . albuterol (PROVENTIL HFA) 108 (90 Base) MCG/ACT inhaler Inhale 2 puffs into the lungs every 4 (four) hours as needed for wheezing or shortness of breath. 3 Inhaler 1 prn at prn  . ARIPiprazole (ABILIFY) 2 MG tablet Take 2 mg by mouth daily.    Past Week at  0500  . Ascorbic Acid (VITAMIN C) 1000 MG tablet Take 500 mg by mouth 2 (two) times daily.    10/02/2018 at 0500  . BREO ELLIPTA 200-25 MCG/INH AEPB Inhale 1 puff into the lungs daily. (Patient taking differently: Inhale 1 puff into the lungs daily. ) 60 each 4 Past Week at 0500  . canagliflozin (INVOKANA) 300 MG TABS tablet Take 300 mg by mouth daily before breakfast.   Past Week at 0500  . clonazePAM (KLONOPIN) 0.5 MG tablet Take 0.5 mg by mouth 2 (two) times daily.   Past Week at 0500  . DULoxetine (CYMBALTA) 60 MG capsule Take 60 mg by mouth 2 (two) times daily.   Past Week at 0500  . furosemide (LASIX) 20 MG tablet Take 1 tablet (20 mg total) by mouth daily. 30 tablet 0 Past Week at 0500  . gabapentin (NEURONTIN) 300 MG capsule Take 600 mg by mouth 2 (two) times daily.   10/02/2018 at 0500  . insulin detemir (LEVEMIR) 100 UNIT/ML injection Inject 12-20 Units into the skin at bedtime.    Past Week at Unknown time  . ipratropium-albuterol (DUONEB) 0.5-2.5 (3) MG/3ML SOLN Inhale contents of 1 vial via nebulizer 3 times daily as needed for shortness of breath/wheeze/cough. (Patient taking differently: Inhale 3 mLs into the lungs 3 (three) times daily as needed (shortness of breath, wheezing or cough). ) 360 mL 3 prn at prn  . isosorbide mononitrate (IMDUR) 30 MG 24 hr tablet Take 1 tablet (30 mg total) by mouth daily.   10/02/2018 at 0500  . lidocaine (LIDODERM) 5 % Place 1 patch onto the skin daily. (remove after 12 hours and apply new patch 12 hours later)  1 Past Week at Unknown time  . LINZESS 290 MCG CAPS capsule Take 1 tablet by mouth daily before breakfast.    Past Week at 0500  . losartan (COZAAR) 25 MG tablet Take 1 tablet (25 mg total) by mouth daily. 30 tablet 0 10/02/2018 at 0500  . metoprolol succinate (TOPROL-XL) 25 MG 24 hr tablet Take 1 tablet (25 mg total) by mouth daily. Take with or immediately following a meal. 30 tablet 0 Past Week at 0500  . mirtazapine (REMERON SOL-TAB) 30 MG  disintegrating tablet Take 30 mg by mouth daily.   2 Past Week at 2000  . montelukast (SINGULAIR) 10 MG tablet Take 1 tablet (10 mg total) by mouth daily. 30 tablet 1 Past Week at 2000  . Multiple Vitamin (MULTIVITAMIN) tablet Take 1 tablet by mouth daily.   Past Week at 0500  . omeprazole (PRILOSEC) 40 MG capsule Take 1 capsule (40 mg total) by mouth daily. 1 hr before supper 30 capsule 0 Past Week at 1800  . Potassium Chloride ER 20 MEQ TBCR Take 20 mEq by mouth daily. Take with lasix   Past Week at Unknown time  . pravastatin (PRAVACHOL)  80 MG tablet Take 1 tablet (80 mg total) by mouth daily. 30 tablet 0 Past Week at 2000  . pregabalin (LYRICA) 150 MG capsule Take 150 mg by mouth 3 (three) times daily.   10/02/2018 at 0500  . sitaGLIPtin (JANUVIA) 100 MG tablet Take 100 mg by mouth daily.   Past Week at 0500  . sucralfate (CARAFATE) 1 g tablet Take 1 g by mouth 2 (two) times daily.    Past Week at 2000  . SUMAtriptan (IMITREX) 25 MG tablet Take 25 mg by mouth every 2 (two) hours as needed for migraine. May repeat in 2 hours if headache persists or recurs.   prn at prn  . tiotropium (SPIRIVA HANDIHALER) 18 MCG inhalation capsule Place 1 capsule (18 mcg total) into inhaler and inhale daily. 90 capsule 2 Past Week at 0500  . tiZANidine (ZANAFLEX) 4 MG tablet Take 4 mg by mouth every 8 (eight) hours as needed for muscle spasms.    10/02/2018 at 0500  . traMADol (ULTRAM) 50 MG tablet Take 50 mg by mouth every 6 (six) hours as needed for moderate pain.   prn at prn  . zolpidem (AMBIEN) 10 MG tablet Take 10 mg by mouth at bedtime as needed for sleep.    prn at prn  . flunisolide (NASALIDE) 25 MCG/ACT (0.025%) SOLN Place 2 sprays into the nose daily as needed.   Taking  . HYDROcodone-acetaminophen (NORCO/VICODIN) 5-325 MG tablet Take 1-2 tablets by mouth at bedtime as needed for moderate pain.   Completed Course at Unknown time   Scheduled: . amLODipine  5 mg Oral Daily  . ARIPiprazole  2 mg Oral Daily    . clonazePAM  0.5 mg Oral BID  . DULoxetine  60 mg Oral BID  . fluticasone furoate-vilanterol  1 puff Inhalation Daily  . furosemide  40 mg Oral BID  . gabapentin  600 mg Oral BID  . insulin aspart  0-5 Units Subcutaneous QHS  . insulin aspart  0-9 Units Subcutaneous TID WC  . insulin detemir  20 Units Subcutaneous Daily  . linaclotide  290 mcg Oral QAC breakfast  . losartan  100 mg Oral Daily  . metoprolol tartrate  25 mg Oral BID  . mirtazapine  30 mg Oral QHS  . montelukast  10 mg Oral Daily  . pantoprazole  40 mg Oral Daily  . polyethylene glycol-electrolytes  4,000 mL Oral Once  . potassium chloride  20 mEq Oral BID  . pravastatin  80 mg Oral Daily  . pregabalin  150 mg Oral TID  . senna-docusate  1 tablet Oral BID  . sucralfate  1 g Oral BID  . tiotropium  18 mcg Inhalation Daily   Continuous:  [id number] **OR** acetaminophen, albuterol, ipratropium-albuterol, ondansetron **OR** ondansetron (ZOFRAN) IV, tiZANidine, traMADol, zolpidem Anti-infectives (From admission, onward)   None     Scheduled Meds: . amLODipine  5 mg Oral Daily  . ARIPiprazole  2 mg Oral Daily  . clonazePAM  0.5 mg Oral BID  . DULoxetine  60 mg Oral BID  . fluticasone furoate-vilanterol  1 puff Inhalation Daily  . furosemide  40 mg Oral BID  . gabapentin  600 mg Oral BID  . insulin aspart  0-5 Units Subcutaneous QHS  . insulin aspart  0-9 Units Subcutaneous TID WC  . insulin detemir  20 Units Subcutaneous Daily  . linaclotide  290 mcg Oral QAC breakfast  . losartan  100 mg Oral Daily  . metoprolol tartrate  25 mg Oral BID  . mirtazapine  30 mg Oral QHS  . montelukast  10 mg Oral Daily  . pantoprazole  40 mg Oral Daily  . polyethylene glycol-electrolytes  4,000 mL Oral Once  . potassium chloride  20 mEq Oral BID  . pravastatin  80 mg Oral Daily  . pregabalin  150 mg Oral TID  . senna-docusate  1 tablet Oral BID  . sucralfate  1 g Oral BID  . tiotropium  18 mcg Inhalation Daily    Continuous Infusions: PRN Meds:.acetaminophen **OR** acetaminophen, albuterol, ipratropium-albuterol, ondansetron **OR** ondansetron (ZOFRAN) IV, tiZANidine, traMADol, zolpidem   Assessment: Active Problems:   GI bleed  Lower GI bleed Elevated LFTs  Plan: Rectal bleeding: Clear liquid diet Plan for colonoscopy tomorrow Bowel prep ordered N.p.o. past midnight  Elevated LFTs: probably in the setting of CHF exacerbation She also has metabolic syndrome, increased echogenicity of the liver on ultrasound Slowly improving Check viral hepatitis panel   LOS: 0 days   Desaray Marschner 10/05/2018, 5:47 PM

## 2018-10-06 ENCOUNTER — Observation Stay: Payer: 59 | Admitting: Anesthesiology

## 2018-10-06 ENCOUNTER — Encounter: Payer: Self-pay | Admitting: Anesthesiology

## 2018-10-06 ENCOUNTER — Encounter
Admission: EM | Disposition: A | Discharge status: Home / Self Care | Payer: Self-pay | Source: Home / Self Care | Attending: Student in an Organized Health Care Education/Training Program

## 2018-10-06 DIAGNOSIS — K625 Hemorrhage of anus and rectum: Secondary | ICD-10-CM | POA: Diagnosis not present

## 2018-10-06 HISTORY — PX: COLONOSCOPY: SHX5424

## 2018-10-06 LAB — CBC
HCT: 38.5 % (ref 36.0–46.0)
Hemoglobin: 12.4 g/dL (ref 12.0–15.0)
MCH: 29.9 pg (ref 26.0–34.0)
MCHC: 32.2 g/dL (ref 30.0–36.0)
MCV: 92.8 fL (ref 80.0–100.0)
Platelets: 256 10*3/uL (ref 150–400)
RBC: 4.15 MIL/uL (ref 3.87–5.11)
RDW: 14.6 % (ref 11.5–15.5)
WBC: 6 10*3/uL (ref 4.0–10.5)
nRBC: 0 % (ref 0.0–0.2)

## 2018-10-06 LAB — COMPREHENSIVE METABOLIC PANEL
ALT: 70 U/L — ABNORMAL HIGH (ref 0–44)
AST: 33 U/L (ref 15–41)
Albumin: 3.9 g/dL (ref 3.5–5.0)
Alkaline Phosphatase: 104 U/L (ref 38–126)
Anion gap: 8 (ref 5–15)
BUN: 13 mg/dL (ref 6–20)
CO2: 30 mmol/L (ref 22–32)
Calcium: 8.7 mg/dL — ABNORMAL LOW (ref 8.9–10.3)
Chloride: 101 mmol/L (ref 98–111)
Creatinine, Ser: 1.03 mg/dL — ABNORMAL HIGH (ref 0.44–1.00)
GFR calc Af Amer: >60 mL/min (ref >60)
GFR calc non Af Amer: >60 mL/min (ref >60)
Glucose, Bld: 252 mg/dL — ABNORMAL HIGH (ref 70–99)
Potassium: 3.3 mmol/L — ABNORMAL LOW (ref 3.5–5.1)
Sodium: 139 mmol/L (ref 135–145)
Total Bilirubin: 0.6 mg/dL (ref 0.3–1.2)
Total Protein: 6.8 g/dL (ref 6.5–8.1)

## 2018-10-06 LAB — GLUCOSE, CAPILLARY
Glucose-Capillary: 171 mg/dL — ABNORMAL HIGH (ref 70–99)
Glucose-Capillary: 168 mg/dL — ABNORMAL HIGH (ref 70–99)

## 2018-10-06 SURGERY — COLONOSCOPY
Anesthesia: General

## 2018-10-06 MED ORDER — PROPOFOL 500 MG/50ML IV EMUL
INTRAVENOUS | Status: DC | PRN
  Administered 2018-10-06: 140 ug/kg/min via INTRAVENOUS

## 2018-10-06 MED ORDER — LIDOCAINE HCL (CARDIAC) PF 100 MG/5ML IV SOSY
PREFILLED_SYRINGE | INTRAVENOUS | Status: DC | PRN
  Administered 2018-10-06: 40 mg via INTRAVENOUS

## 2018-10-06 MED ORDER — HYDROCORTISONE ACETATE 25 MG RE SUPP
25.0000 mg | Freq: Two times a day (BID) | RECTAL | Status: DC
  Filled 2018-10-06 (×2): qty 1

## 2018-10-06 MED ORDER — POTASSIUM CHLORIDE CRYS ER 20 MEQ PO TBCR: 40.0000 meq | EXTENDED_RELEASE_TABLET | Freq: Once | ORAL | Status: DC

## 2018-10-06 MED ORDER — SODIUM CHLORIDE 0.9 % IV SOLN
INTRAVENOUS | Status: DC
  Administered 2018-10-06: 1000 mL via INTRAVENOUS

## 2018-10-06 MED ORDER — PROPOFOL 10 MG/ML IV BOLUS
INTRAVENOUS | Status: DC | PRN
  Administered 2018-10-06: 70 mg via INTRAVENOUS

## 2018-10-06 MED ORDER — SENNOSIDES-DOCUSATE SODIUM 8.6-50 MG PO TABS: 1.0000 | ORAL_TABLET | Freq: Two times a day (BID) | ORAL | 0 refills | Status: DC

## 2018-10-06 MED ORDER — AMLODIPINE BESYLATE 5 MG PO TABS: 5.0000 mg | ORAL_TABLET | Freq: Every day | ORAL | 0 refills | Status: DC

## 2018-10-06 MED ORDER — LOSARTAN POTASSIUM 100 MG PO TABS: 100.0000 mg | ORAL_TABLET | Freq: Every day | ORAL | 0 refills | Status: DC

## 2018-10-06 MED ORDER — HYDROCORTISONE ACETATE 25 MG RE SUPP: 25.0000 mg | Freq: Two times a day (BID) | RECTAL | 0 refills | Status: DC

## 2018-10-06 NOTE — Anesthesia Preprocedure Evaluation (Signed)
Anesthesia Evaluation  Patient identified by MRN, date of birth, ID band Patient awake    Reviewed: Allergy & Precautions, NPO status , Patient's Chart, lab work & pertinent test results, reviewed documented beta blocker date and time   Airway Mallampati: III  TM Distance: >3 FB     Dental  (+) Chipped, Partial Upper   Pulmonary asthma , sleep apnea ,           Cardiovascular hypertension, Pt. on medications and Pt. on home beta blockers +CHF       Neuro/Psych  Headaches, PSYCHIATRIC DISORDERS Depression  Neuromuscular disease CVA    GI/Hepatic GERD  ,  Endo/Other  diabetes, Type 2  Renal/GU      Musculoskeletal  (+) Arthritis , Fibromyalgia -  Abdominal   Peds  Hematology   Anesthesia Other Findings   Reproductive/Obstetrics                             Anesthesia Physical Anesthesia Plan  ASA: III  Anesthesia Plan: General   Post-op Pain Management:    Induction: Intravenous  PONV Risk Score and Plan:   Airway Management Planned:   Additional Equipment:   Intra-op Plan:   Post-operative Plan:   Informed Consent: I have reviewed the patients History and Physical, chart, labs and discussed the procedure including the risks, benefits and alternatives for the proposed anesthesia with the patient or authorized representative who has indicated his/her understanding and acceptance.       Plan Discussed with: CRNA  Anesthesia Plan Comments:         Anesthesia Quick Evaluation

## 2018-10-06 NOTE — Discharge Planning (Signed)
Patient IV x2 removed.  RN assessment and VS revealed stability for DC to home.  Discharge papers given, explained and educated.  Informed of suggested FU appt and appt made.  Scripts printed and given.  Once ready, to be wheeled to front and family transporting home via car.

## 2018-10-06 NOTE — Anesthesia Post-op Follow-up Note (Signed)
Anesthesia QCDR form completed.        

## 2018-10-06 NOTE — Transfer of Care (Signed)
Immediate Anesthesia Transfer of Care Note  Patient: Amanda [name]  Procedure(s) Performed: COLONOSCOPY (N/A )  Patient Location: PACU  Anesthesia Type:General  Level of Consciousness: sedated  Airway & Oxygen Therapy: Patient Spontanous Breathing and Patient connected to nasal cannula oxygen  Post-op Assessment: Report given to RN and Post -op Vital signs reviewed and stable  Post vital signs: Reviewed and stable  Last Vitals:  Vitals Value Taken Time  BP 116/82 10/06/2018 12:50 PM  Temp 36.3 C 10/06/2018 12:50 PM  Pulse 89 10/06/2018 12:50 PM  Resp 15 10/06/2018 12:50 PM  SpO2 98 % 10/06/2018 12:50 PM    Last Pain:  Vitals:   10/06/18 1250  TempSrc:   PainSc: 0-No pain      Patients Stated Pain Goal: 0 (10/06/18 0959)  Complications: No apparent anesthesia complications

## 2018-10-06 NOTE — Op Note (Signed)
[facility] Patient Name: Amanda [name] Procedure Date: 10/06/2018 12:19 PM MRN: [id number] Account #: [id number] Date of Birth: [birth year] Admit Type: Inpatient Age: 53 Room: [facility] ENDO ROOM 1 Gender: Female Note Status: Finalized Procedure:            Colonoscopy Indications:          Rectal bleeding Providers:            Lin Landsman MD, MD Referring MD:         Edmonia Lynch. Aycock MD (Referring MD) Medicines:            Monitored Anesthesia Care Complications:        No immediate complications. Estimated blood loss: None. Procedure:            Pre-Anesthesia Assessment:                       - Prior to the procedure, a History and Physical was                        performed, and patient medications and allergies were                        reviewed. The patient is competent. The risks and                        benefits of the procedure and the sedation options and                        risks were discussed with the patient. All questions                        were answered and informed consent was obtained.                        Patient identification and proposed procedure were                        verified by the physician, the nurse, the                        anesthesiologist, the anesthetist and the technician in                        the pre-procedure area in the procedure room in the                        endoscopy suite. Mental Status Examination: alert and                        oriented. Airway Examination: normal oropharyngeal                        airway and neck mobility. Respiratory Examination:                        clear to auscultation. CV Examination: normal.                        Prophylactic Antibiotics: The patient does not require  prophylactic antibiotics. Prior Anticoagulants: The                        patient has taken no previous anticoagulant or                        antiplatelet  agents. ASA Grade Assessment: III - A                        patient with severe systemic disease. After reviewing                        the risks and benefits, the patient was deemed in                        satisfactory condition to undergo the procedure. The                        anesthesia plan was to use monitored anesthesia care                        (MAC). Immediately prior to administration of                        medications, the patient was re-assessed for adequacy                        to receive sedatives. The heart rate, respiratory rate,                        oxygen saturations, blood pressure, adequacy of                        pulmonary ventilation, and response to care were                        monitored throughout the procedure. The physical status                        of the patient was re-assessed after the procedure.                       After obtaining informed consent, the colonoscope was                        passed under direct vision. Throughout the procedure,                        the patient's blood pressure, pulse, and oxygen                        saturations were monitored continuously. The                        Colonoscope was introduced through the anus and                        advanced to the the cecum, identified by appendiceal  orifice and ileocecal valve. The colonoscopy was                        performed with moderate difficulty due to significant                        looping and the patient's body habitus. Successful                        completion of the procedure was aided by applying                        abdominal pressure. The patient tolerated the procedure                        well. The quality of the bowel preparation was fair. Findings:      Hemorrhoids were found on perianal exam.      A moderate amount of semi-liquid stool was found in the entire colon,       without interference to  visualization.      Non-bleeding external and internal hemorrhoids were found during       retroflexion. One hemorrhoid was large with stigmata of recent bleeding. Impression:           - Preparation of the colon was fair.                       - Hemorrhoids found on perianal exam.                       - Stool in the entire examined colon.                       - Non-bleeding external and internal hemorrhoids,                        source of rectal bleeding                       - No specimens collected. Recommendation:       - Return patient to hospital ward for possible                        discharge same day.                       - Resume previous diet today.                       - Continue present medications.                       - Recommed f/u with Dr Alice Reichert or with me for outpatient                        hemorrhoid ligation Procedure Code(s):    --- Professional ---                       [phone], Colonoscopy, flexible; diagnostic, including                        collection of specimen(s) by brushing  or washing, when                        performed (separate procedure) Diagnosis Code(s):    --- Professional ---                       K64.8, Other hemorrhoids                       K62.5, Hemorrhage of anus and rectum CPT copyright 2018 [employer]. All rights reserved. The codes documented in this report are preliminary and upon coder review may  be revised to meet current compliance requirements. Dr. Ulyess Mort Lin Landsman MD, MD 10/06/2018 12:50:18 PM This report has been signed electronically. Number of Addenda: 0 Note Initiated On: 10/06/2018 12:19 PM Scope Withdrawal Time: 0 hours 8 minutes 53 seconds  Total Procedure Duration: 0 hours 16 minutes 52 seconds       [facility]

## 2018-10-06 NOTE — Progress Notes (Signed)
This is to notify Mr.donnell walker was here to visit Ms. [name] in hospital on 1/31, 2/1, 2/2. For any further questions please call hospital- [phone].  ( This is as per pt's family and nursing staff.) -Dr.Kecia Swoboda

## 2018-10-06 NOTE — Plan of Care (Signed)
Patient ready to go to endo.  NPO since midnight. CHG x2 completed.  VS stable. Report given. Last BM 0615 and (clear) per report.  Patient ambulated in [place] (10 minutes).  Jewelry and personal items removed. Consent signed.

## 2018-10-06 NOTE — Progress Notes (Signed)
SUBJECTIVE: Patient denies any chest pain   Vitals:   10/06/18 1120 10/06/18 1250 10/06/18 1300 10/06/18 1310  BP: (!) 148/95 116/82 135/89 (!) 172/103  Pulse: 76 95 75 72  Resp:  (!) 24 16 18   Temp:  (!) 97.4 F (36.3 C)    TempSrc:  Tympanic    SpO2: 98% 100% 100% 100%  Weight:      Height:        Intake/Output Summary (Last 24 hours) at 10/06/2018 1440 Last data filed at 10/06/2018 1247 Gross per 24 hour  Intake 350 ml  Output 900 ml  Net -550 ml    LABS: Basic Metabolic Panel: Recent Labs    10/04/18 0744 10/06/18 0358  NA 138 139  K 3.2* 3.3*  CL 107 101  CO2 25 30  GLUCOSE 351* 252*  BUN 6 13  CREATININE 0.72 1.03*  CALCIUM 8.9 8.7*   Liver Function Tests: Recent Labs    10/04/18 0744 10/06/18 0358  AST 93* 33  ALT 106* 70*  ALKPHOS 116 104  BILITOT 0.4 0.6  PROT 7.1 6.8  ALBUMIN 3.8 3.9   No results for input(s): LIPASE, AMYLASE in the last 72 hours. CBC: Recent Labs    10/04/18 0547 10/06/18 0358  WBC 5.7 6.0  HGB 13.3 12.4  HCT 40.8 38.5  MCV 90.9 92.8  PLT 286 256   Cardiac Enzymes: Recent Labs    10/04/18 0744 10/04/18 1328 10/04/18 1939  TROPONINI <0.03 <0.03 <0.03   BNP: Invalid input(s): POCBNP D-Dimer: No results for input(s): DDIMER in the last 72 hours. Hemoglobin A1C: No results for input(s): HGBA1C in the last 72 hours. Fasting Lipid Panel: No results for input(s): CHOL, HDL, LDLCALC, TRIG, CHOLHDL, LDLDIRECT in the last 72 hours. Thyroid Function Tests: Recent Labs    10/04/18 1939  TSH 1.089   Anemia Panel: No results for input(s): VITAMINB12, FOLATE, FERRITIN, TIBC, IRON, RETICCTPCT in the last 72 hours.   PHYSICAL EXAM General: Well developed, well nourished, in no acute distress HEENT:  Normocephalic and atramatic Neck:  No JVD.  Lungs: Clear bilaterally to auscultation and percussion. Heart: HRRR . Normal S1 and S2 without gallops or murmurs.  Abdomen: Bowel sounds are positive, abdomen soft and  non-tender  Msk:  Back normal, normal gait. Normal strength and tone for age. Extremities: No clubbing, cyanosis or edema.   Neuro: Alert and oriented X 3. Psych:  Good affect, responds appropriately  TELEMETRY: Sinus rhythm  ASSESSMENT AND PLAN: Status post colonoscopy and found to have bleeding hemorrhoid requiring surgery.  Patient has history of normal coronaries 2 years ago and a negative stress test last year and has noncardiac chest pain.  Left ventricular ejection fraction is normal.  Patient is cleared to have a hemorrhoidectomy if needed.  Active Problems:   GI bleed    Laurier Nancy, MD, [facility] 10/06/2018 2:40 PM

## 2018-10-06 NOTE — Anesthesia Postprocedure Evaluation (Signed)
Anesthesia Post Note  Patient: Amanda [name]  Procedure(s) Performed: COLONOSCOPY (N/A )  Patient location during evaluation: Endoscopy Anesthesia Type: General Level of consciousness: awake and alert Pain management: pain level controlled Vital Signs Assessment: post-procedure vital signs reviewed and stable Respiratory status: spontaneous breathing, nonlabored ventilation, respiratory function stable and patient connected to nasal cannula oxygen Cardiovascular status: blood pressure returned to baseline and stable Postop Assessment: no apparent nausea or vomiting Anesthetic complications: no     Last Vitals:  Vitals:   10/06/18 1300 10/06/18 1310  BP: 135/89 (!) 172/103  Pulse: 75 72  Resp: 16 18  Temp:    SpO2: 100% 100%    Last Pain:  Vitals:   10/06/18 1310  TempSrc:   PainSc: 8                  Keir Viernes S

## 2018-10-07 ENCOUNTER — Encounter: Payer: Self-pay | Admitting: Gastroenterology

## 2018-10-07 LAB — HEPATITIS PANEL, ACUTE
HCV Ab: <0.1 s/co ratio (ref 0.0–0.9)
Hep A IgM: NEGATIVE
Hep B C IgM: NEGATIVE
Hepatitis B Surface Ag: NEGATIVE

## 2018-10-10 ENCOUNTER — Emergency Department: Payer: 59

## 2018-10-10 ENCOUNTER — Other Ambulatory Visit: Payer: Self-pay

## 2018-10-10 ENCOUNTER — Inpatient Hospital Stay
Admission: EM | Admit: 2018-10-10 | Discharge: 2018-10-13 | DRG: 683 | Disposition: A | Discharge status: Home Health | Payer: 59 | Attending: Internal Medicine | Admitting: Internal Medicine

## 2018-10-10 DIAGNOSIS — E860 Dehydration: Secondary | ICD-10-CM

## 2018-10-10 DIAGNOSIS — Z8249 Family history of ischemic heart disease and other diseases of the circulatory system: Secondary | ICD-10-CM | POA: Diagnosis not present

## 2018-10-10 DIAGNOSIS — M797 Fibromyalgia: Secondary | ICD-10-CM | POA: Diagnosis present

## 2018-10-10 DIAGNOSIS — M170 Bilateral primary osteoarthritis of knee: Secondary | ICD-10-CM | POA: Diagnosis present

## 2018-10-10 DIAGNOSIS — G43909 Migraine, unspecified, not intractable, without status migrainosus: Secondary | ICD-10-CM | POA: Diagnosis present

## 2018-10-10 DIAGNOSIS — E876 Hypokalemia: Secondary | ICD-10-CM | POA: Diagnosis present

## 2018-10-10 DIAGNOSIS — Z888 Allergy status to other drugs, medicaments and biological substances status: Secondary | ICD-10-CM

## 2018-10-10 DIAGNOSIS — R000 Tachycardia, unspecified: Secondary | ICD-10-CM | POA: Diagnosis not present

## 2018-10-10 DIAGNOSIS — R739 Hyperglycemia, unspecified: Secondary | ICD-10-CM

## 2018-10-10 DIAGNOSIS — I959 Hypotension, unspecified: Secondary | ICD-10-CM | POA: Diagnosis present

## 2018-10-10 DIAGNOSIS — Z880 Allergy status to penicillin: Secondary | ICD-10-CM

## 2018-10-10 DIAGNOSIS — I5032 Chronic diastolic (congestive) heart failure: Secondary | ICD-10-CM | POA: Diagnosis present

## 2018-10-10 DIAGNOSIS — Z6832 Body mass index (BMI) 32.0-32.9, adult: Secondary | ICD-10-CM | POA: Diagnosis not present

## 2018-10-10 DIAGNOSIS — Z8673 Personal history of transient ischemic attack (TIA), and cerebral infarction without residual deficits: Secondary | ICD-10-CM | POA: Diagnosis not present

## 2018-10-10 DIAGNOSIS — Z90710 Acquired absence of both cervix and uterus: Secondary | ICD-10-CM

## 2018-10-10 DIAGNOSIS — J45909 Unspecified asthma, uncomplicated: Secondary | ICD-10-CM | POA: Diagnosis present

## 2018-10-10 DIAGNOSIS — M479 Spondylosis, unspecified: Secondary | ICD-10-CM | POA: Diagnosis present

## 2018-10-10 DIAGNOSIS — E669 Obesity, unspecified: Secondary | ICD-10-CM | POA: Diagnosis present

## 2018-10-10 DIAGNOSIS — Z7951 Long term (current) use of inhaled steroids: Secondary | ICD-10-CM

## 2018-10-10 DIAGNOSIS — G4733 Obstructive sleep apnea (adult) (pediatric): Secondary | ICD-10-CM | POA: Diagnosis present

## 2018-10-10 DIAGNOSIS — K219 Gastro-esophageal reflux disease without esophagitis: Secondary | ICD-10-CM | POA: Diagnosis present

## 2018-10-10 DIAGNOSIS — M19019 Primary osteoarthritis, unspecified shoulder: Secondary | ICD-10-CM | POA: Diagnosis present

## 2018-10-10 DIAGNOSIS — N179 Acute kidney failure, unspecified: Secondary | ICD-10-CM | POA: Diagnosis present

## 2018-10-10 DIAGNOSIS — Z794 Long term (current) use of insulin: Secondary | ICD-10-CM

## 2018-10-10 DIAGNOSIS — E1165 Type 2 diabetes mellitus with hyperglycemia: Secondary | ICD-10-CM | POA: Diagnosis present

## 2018-10-10 DIAGNOSIS — Z79899 Other long term (current) drug therapy: Secondary | ICD-10-CM

## 2018-10-10 DIAGNOSIS — Z833 Family history of diabetes mellitus: Secondary | ICD-10-CM | POA: Diagnosis not present

## 2018-10-10 DIAGNOSIS — I110 Hypertensive heart disease with heart failure: Secondary | ICD-10-CM | POA: Diagnosis present

## 2018-10-10 DIAGNOSIS — R42 Dizziness and giddiness: Secondary | ICD-10-CM | POA: Diagnosis present

## 2018-10-10 DIAGNOSIS — E871 Hypo-osmolality and hyponatremia: Secondary | ICD-10-CM | POA: Diagnosis present

## 2018-10-10 LAB — COMPREHENSIVE METABOLIC PANEL
ALT: 40 U/L (ref 0–44)
AST: 39 U/L (ref 15–41)
Albumin: 4.3 g/dL (ref 3.5–5.0)
Alkaline Phosphatase: 111 U/L (ref 38–126)
Anion gap: 16 — ABNORMAL HIGH (ref 5–15)
BUN: 36 mg/dL — ABNORMAL HIGH (ref 6–20)
CO2: 26 mmol/L (ref 22–32)
Calcium: 9.5 mg/dL (ref 8.9–10.3)
Chloride: 88 mmol/L — ABNORMAL LOW (ref 98–111)
Creatinine, Ser: 3.53 mg/dL — ABNORMAL HIGH (ref 0.44–1.00)
GFR calc Af Amer: 16 mL/min — ABNORMAL LOW (ref >60)
GFR calc non Af Amer: 14 mL/min — ABNORMAL LOW (ref >60)
Glucose, Bld: 501 mg/dL (ref 70–99)
Potassium: 4.2 mmol/L (ref 3.5–5.1)
Sodium: 130 mmol/L — ABNORMAL LOW (ref 135–145)
Total Bilirubin: 0.8 mg/dL (ref 0.3–1.2)
Total Protein: 7.7 g/dL (ref 6.5–8.1)

## 2018-10-10 LAB — GLUCOSE, CAPILLARY
Glucose-Capillary: 476 mg/dL — ABNORMAL HIGH (ref 70–99)
Glucose-Capillary: 282 mg/dL — ABNORMAL HIGH (ref 70–99)
Glucose-Capillary: 261 mg/dL — ABNORMAL HIGH (ref 70–99)
Glucose-Capillary: 170 mg/dL — ABNORMAL HIGH (ref 70–99)

## 2018-10-10 LAB — CBC WITH DIFFERENTIAL/PLATELET
Abs Immature Granulocytes: 0.04 10*3/uL (ref 0.00–0.07)
Basophils Absolute: 0 10*3/uL (ref 0.0–0.1)
Basophils Relative: 1 %
Eosinophils Absolute: 0 10*3/uL (ref 0.0–0.5)
Eosinophils Relative: 1 %
HCT: 44.5 % (ref 36.0–46.0)
Hemoglobin: 14.1 g/dL (ref 12.0–15.0)
Immature Granulocytes: 1 %
Lymphocytes Relative: 28 %
Lymphs Abs: 1.7 10*3/uL (ref 0.7–4.0)
MCH: 29.5 pg (ref 26.0–34.0)
MCHC: 31.7 g/dL (ref 30.0–36.0)
MCV: 93.1 fL (ref 80.0–100.0)
Monocytes Absolute: 0.7 10*3/uL (ref 0.1–1.0)
Monocytes Relative: 12 %
Neutro Abs: 3.6 10*3/uL (ref 1.7–7.7)
Neutrophils Relative %: 57 %
Platelets: 256 10*3/uL (ref 150–400)
RBC: 4.78 MIL/uL (ref 3.87–5.11)
RDW: 14.9 % (ref 11.5–15.5)
WBC: 6 10*3/uL (ref 4.0–10.5)
nRBC: 0 % (ref 0.0–0.2)

## 2018-10-10 LAB — CREATININE, SERUM
Creatinine, Ser: 2.7 mg/dL — ABNORMAL HIGH (ref 0.44–1.00)
GFR calc Af Amer: 22 mL/min — ABNORMAL LOW (ref >60)
GFR calc non Af Amer: 19 mL/min — ABNORMAL LOW (ref >60)

## 2018-10-10 LAB — TROPONIN I: Troponin I: <0.03 ng/mL (ref <0.03)

## 2018-10-10 LAB — LACTIC ACID, PLASMA: Lactic Acid, Venous: 1.7 mmol/L (ref 0.5–1.9)

## 2018-10-10 LAB — BETA-HYDROXYBUTYRIC ACID: Beta-Hydroxybutyric Acid: 0.11 mmol/L (ref 0.05–0.27)

## 2018-10-10 MED ORDER — SODIUM CHLORIDE 0.9% FLUSH
3.0000 mL | Freq: Two times a day (BID) | INTRAVENOUS | Status: DC
  Administered 2018-10-10 – 2018-10-13 (×6): 3 mL via INTRAVENOUS

## 2018-10-10 MED ORDER — INSULIN ASPART 100 UNIT/ML ~~LOC~~ SOLN
5.0000 [IU] | Freq: Once | SUBCUTANEOUS | Status: AC
  Administered 2018-10-10: 5 [IU] via SUBCUTANEOUS
  Filled 2018-10-10: qty 1

## 2018-10-10 MED ORDER — SODIUM CHLORIDE 0.9 % IV SOLN
INTRAVENOUS | Status: DC
  Administered 2018-10-10 – 2018-10-11 (×2): via INTRAVENOUS

## 2018-10-10 MED ORDER — ATROPINE SULFATE 1 MG/10ML IJ SOSY
0.5000 mg | PREFILLED_SYRINGE | Freq: Once | INTRAMUSCULAR | Status: AC
  Administered 2018-10-10: 0.5 mg via INTRAVENOUS

## 2018-10-10 MED ORDER — ATROPINE SULFATE 1 MG/10ML IJ SOSY: 0.5000 mg | PREFILLED_SYRINGE | Freq: Once | INTRAMUSCULAR | Status: DC

## 2018-10-10 MED ORDER — SODIUM CHLORIDE 0.9 % IV SOLN
Freq: Once | INTRAVENOUS | Status: AC
  Administered 2018-10-10: 11:00:00 via INTRAVENOUS

## 2018-10-10 MED ORDER — PREGABALIN 75 MG PO CAPS
150.0000 mg | ORAL_CAPSULE | Freq: Three times a day (TID) | ORAL | Status: DC
  Administered 2018-10-10 – 2018-10-13 (×8): 150 mg via ORAL
  Filled 2018-10-10 (×8): qty 2

## 2018-10-10 MED ORDER — PRAVASTATIN SODIUM 40 MG PO TABS
80.0000 mg | ORAL_TABLET | Freq: Every day | ORAL | Status: DC
  Administered 2018-10-10 – 2018-10-12 (×3): 80 mg via ORAL
  Filled 2018-10-10 (×3): qty 2

## 2018-10-10 MED ORDER — MIRTAZAPINE 15 MG PO TBDP
30.0000 mg | ORAL_TABLET | Freq: Every day | ORAL | Status: DC
  Administered 2018-10-10 – 2018-10-12 (×3): 30 mg via ORAL
  Filled 2018-10-10 (×4): qty 2

## 2018-10-10 MED ORDER — INSULIN DETEMIR 100 UNIT/ML ~~LOC~~ SOLN
45.0000 [IU] | Freq: Every day | SUBCUTANEOUS | Status: DC
  Administered 2018-10-11: 45 [IU] via SUBCUTANEOUS
  Filled 2018-10-10 (×2): qty 0.45

## 2018-10-10 MED ORDER — ACETAMINOPHEN 325 MG PO TABS: 650.0000 mg | ORAL_TABLET | Freq: Four times a day (QID) | ORAL | Status: DC | PRN

## 2018-10-10 MED ORDER — INSULIN ASPART 100 UNIT/ML ~~LOC~~ SOLN: 0.0000 [IU] | Freq: Every day | SUBCUTANEOUS | Status: DC

## 2018-10-10 MED ORDER — SODIUM CHLORIDE 0.9 % IV SOLN
Freq: Once | INTRAVENOUS | Status: AC
  Administered 2018-10-10: 12:00:00 via INTRAVENOUS

## 2018-10-10 MED ORDER — HEPARIN SODIUM (PORCINE) 5000 UNIT/ML IJ SOLN
5000.0000 [IU] | Freq: Three times a day (TID) | INTRAMUSCULAR | Status: DC
  Administered 2018-10-10 – 2018-10-13 (×5): 5000 [IU] via SUBCUTANEOUS
  Filled 2018-10-10 (×5): qty 1

## 2018-10-10 MED ORDER — TRAMADOL HCL 50 MG PO TABS
50.0000 mg | ORAL_TABLET | Freq: Four times a day (QID) | ORAL | Status: DC | PRN
  Administered 2018-10-10: 50 mg via ORAL
  Filled 2018-10-10: qty 1

## 2018-10-10 MED ORDER — SODIUM CHLORIDE 0.9 % IV BOLUS
1000.0000 mL | Freq: Once | INTRAVENOUS | Status: AC
  Administered 2018-10-10: 1000 mL via INTRAVENOUS

## 2018-10-10 MED ORDER — ONDANSETRON HCL 4 MG/2ML IJ SOLN: 4.0000 mg | Freq: Four times a day (QID) | INTRAMUSCULAR | Status: DC | PRN

## 2018-10-10 MED ORDER — MONTELUKAST SODIUM 10 MG PO TABS
10.0000 mg | ORAL_TABLET | Freq: Every day | ORAL | Status: DC
  Administered 2018-10-11 – 2018-10-12 (×2): 10 mg via ORAL
  Filled 2018-10-10 (×2): qty 1

## 2018-10-10 MED ORDER — SENNOSIDES-DOCUSATE SODIUM 8.6-50 MG PO TABS: 1.0000 | ORAL_TABLET | Freq: Every evening | ORAL | Status: DC | PRN

## 2018-10-10 MED ORDER — HYDROCORTISONE ACETATE 25 MG RE SUPP
25.0000 mg | Freq: Two times a day (BID) | RECTAL | Status: DC
  Administered 2018-10-11 – 2018-10-13 (×5): 25 mg via RECTAL
  Filled 2018-10-10 (×6): qty 1

## 2018-10-10 MED ORDER — TIOTROPIUM BROMIDE MONOHYDRATE 18 MCG IN CAPS
18.0000 ug | ORAL_CAPSULE | Freq: Every day | RESPIRATORY_TRACT | Status: DC
  Administered 2018-10-11 – 2018-10-13 (×3): 18 ug via RESPIRATORY_TRACT
  Filled 2018-10-10: qty 5

## 2018-10-10 MED ORDER — DULOXETINE HCL 30 MG PO CPEP
60.0000 mg | ORAL_CAPSULE | Freq: Two times a day (BID) | ORAL | Status: DC
  Administered 2018-10-10 – 2018-10-13 (×6): 60 mg via ORAL
  Filled 2018-10-10 (×6): qty 2

## 2018-10-10 MED ORDER — SODIUM CHLORIDE 0.9 % IV SOLN
1.0000 g | Freq: Once | INTRAVENOUS | Status: AC
  Administered 2018-10-10: 1 g via INTRAVENOUS
  Filled 2018-10-10: qty 10

## 2018-10-10 MED ORDER — BISACODYL 5 MG PO TBEC: 5.0000 mg | DELAYED_RELEASE_TABLET | Freq: Every day | ORAL | Status: DC | PRN

## 2018-10-10 MED ORDER — SENNOSIDES-DOCUSATE SODIUM 8.6-50 MG PO TABS
1.0000 | ORAL_TABLET | Freq: Two times a day (BID) | ORAL | Status: DC
  Administered 2018-10-10 – 2018-10-13 (×6): 1 via ORAL
  Filled 2018-10-10 (×6): qty 1

## 2018-10-10 MED ORDER — SUMATRIPTAN SUCCINATE 50 MG PO TABS
25.0000 mg | ORAL_TABLET | ORAL | Status: DC | PRN
  Filled 2018-10-10: qty 1

## 2018-10-10 MED ORDER — SUCRALFATE 1 G PO TABS
1.0000 g | ORAL_TABLET | Freq: Two times a day (BID) | ORAL | Status: DC
  Administered 2018-10-10 – 2018-10-13 (×6): 1 g via ORAL
  Filled 2018-10-10 (×6): qty 1

## 2018-10-10 MED ORDER — INSULIN DETEMIR 100 UNIT/ML ~~LOC~~ SOLN
45.0000 [IU] | Freq: Every day | SUBCUTANEOUS | Status: DC
  Filled 2018-10-10: qty 0.45

## 2018-10-10 MED ORDER — ALBUTEROL SULFATE (2.5 MG/3ML) 0.083% IN NEBU: 2.5000 mg | INHALATION_SOLUTION | RESPIRATORY_TRACT | Status: DC | PRN

## 2018-10-10 MED ORDER — ONDANSETRON HCL 4 MG PO TABS: 4.0000 mg | ORAL_TABLET | Freq: Four times a day (QID) | ORAL | Status: DC | PRN

## 2018-10-10 MED ORDER — TIZANIDINE HCL 4 MG PO TABS
4.0000 mg | ORAL_TABLET | Freq: Three times a day (TID) | ORAL | Status: DC | PRN
  Administered 2018-10-11 – 2018-10-13 (×3): 4 mg via ORAL
  Filled 2018-10-10 (×7): qty 1

## 2018-10-10 MED ORDER — SODIUM CHLORIDE 0.9% FLUSH: 3.0000 mL | INTRAVENOUS | Status: DC | PRN

## 2018-10-10 MED ORDER — SODIUM CHLORIDE 0.9% FLUSH
3.0000 mL | Freq: Two times a day (BID) | INTRAVENOUS | Status: DC
  Administered 2018-10-11 – 2018-10-13 (×4): 3 mL via INTRAVENOUS

## 2018-10-10 MED ORDER — CLONAZEPAM 0.5 MG PO TABS
0.5000 mg | ORAL_TABLET | Freq: Two times a day (BID) | ORAL | Status: DC
  Administered 2018-10-10 – 2018-10-13 (×6): 0.5 mg via ORAL
  Filled 2018-10-10 (×6): qty 1

## 2018-10-10 MED ORDER — INSULIN ASPART 100 UNIT/ML ~~LOC~~ SOLN
0.0000 [IU] | Freq: Three times a day (TID) | SUBCUTANEOUS | Status: DC
  Administered 2018-10-10: 8 [IU] via SUBCUTANEOUS
  Administered 2018-10-11: 3 [IU] via SUBCUTANEOUS
  Administered 2018-10-11: 2 [IU] via SUBCUTANEOUS
  Administered 2018-10-11: 11 [IU] via SUBCUTANEOUS
  Administered 2018-10-12: 5 [IU] via SUBCUTANEOUS
  Administered 2018-10-12: 8 [IU] via SUBCUTANEOUS
  Administered 2018-10-12: 5 [IU] via SUBCUTANEOUS
  Administered 2018-10-13: 3 [IU] via SUBCUTANEOUS
  Administered 2018-10-13: 2 [IU] via SUBCUTANEOUS
  Filled 2018-10-10 (×9): qty 1

## 2018-10-10 MED ORDER — ATROPINE SULFATE 1 MG/10ML IJ SOSY
PREFILLED_SYRINGE | INTRAMUSCULAR | Status: AC
  Administered 2018-10-10: 0.5 mg via INTRAVENOUS
  Filled 2018-10-10: qty 10

## 2018-10-10 MED ORDER — ARIPIPRAZOLE 2 MG PO TABS
2.0000 mg | ORAL_TABLET | Freq: Every day | ORAL | Status: DC
  Administered 2018-10-11 – 2018-10-13 (×3): 2 mg via ORAL
  Filled 2018-10-10 (×3): qty 1

## 2018-10-10 MED ORDER — LINACLOTIDE 290 MCG PO CAPS
290.0000 ug | ORAL_CAPSULE | Freq: Every day | ORAL | Status: DC
  Administered 2018-10-11 – 2018-10-13 (×3): 290 ug via ORAL
  Filled 2018-10-10 (×3): qty 1

## 2018-10-10 MED ORDER — FLUTICASONE PROPIONATE 50 MCG/ACT NA SUSP
1.0000 | Freq: Every day | NASAL | Status: DC | PRN
  Filled 2018-10-10: qty 16

## 2018-10-10 MED ORDER — ACETAMINOPHEN 650 MG RE SUPP: 650.0000 mg | Freq: Four times a day (QID) | RECTAL | Status: DC | PRN

## 2018-10-10 MED ORDER — FLUTICASONE FUROATE-VILANTEROL 200-25 MCG/INH IN AEPB
1.0000 | INHALATION_SPRAY | Freq: Every day | RESPIRATORY_TRACT | Status: DC
  Administered 2018-10-11 – 2018-10-13 (×3): 1 via RESPIRATORY_TRACT
  Filled 2018-10-10: qty 28

## 2018-10-10 MED ORDER — ZOLPIDEM TARTRATE 5 MG PO TABS
5.0000 mg | ORAL_TABLET | Freq: Every evening | ORAL | Status: DC | PRN
  Administered 2018-10-12 (×2): 5 mg via ORAL
  Filled 2018-10-10 (×2): qty 1

## 2018-10-10 MED ORDER — PANTOPRAZOLE SODIUM 40 MG PO TBEC
40.0000 mg | DELAYED_RELEASE_TABLET | Freq: Every day | ORAL | Status: DC
  Administered 2018-10-11 – 2018-10-13 (×3): 40 mg via ORAL
  Filled 2018-10-10 (×3): qty 1

## 2018-10-10 NOTE — Progress Notes (Signed)
Advanced Care Plan.  Purpose of Encounter: CODE STATUS. Parties in Attendance: The patient and me. Patient's Decisional Capacity: Yes. Medical Story: Amanda [name]  is a 53 y.o. female with a known history of recent GI bleeding, CHF, hypertension, diabetes, GERD, stroke, OSA, migraine etc. the patient is being admitted for hypotension and acute renal failure.  I discussed with the patient about her current condition, prognosis and CODE STATUS.  The patient wants to be resuscitated and intubated if she has cardiopulmonary arrest. Plan:  Code Status: Full code. Time spent discussing advance care planning: 17 minutes.

## 2018-10-10 NOTE — ED Triage Notes (Signed)
Pt arrives via ems from home, pt was admitted last week and reports admitted for bleeding hemhrroids that required repair. Pt is hyperglycemic of 535 for ems and hypotensive with a systolic in the 60's. Pt is sleepy and family reported to ems that they thought she was having a stroke, pt was given levemir at home for her high blood sugar. Pt will respond to questions by is sleepy and has slurred speech

## 2018-10-10 NOTE — ED Provider Notes (Signed)
[facility] Emergency Department Provider Note       Time seen: ----------------------------------------- 11:15 AM on 10/10/2018 -----------------------------------------   I have reviewed the triage vital signs and the nursing notes.  HISTORY   Chief Complaint Hyperglycemia; Hypotension; Weakness; and Altered Mental Status    HPI Amanda [name] is a 53 y.o. female with a history of arthritis, asthma, CHF, diabetes, fibromyalgia, GERD, hypertension, hypokalemia, CVA who presents to the ED for hyperglycemia and hypotension.  According to EMS she was hyperglycemic with a blood sugar in the 500s and her blood pressure in the 60s.  She was drowsy on arrival.  Family thought that she was having a stroke due to left-sided weakness.  Patient had some slurred speech on arrival.  Past Medical History:  Diagnosis Date  . Arthritis    knees, shoulder, upper back  . Asthma   . CHF (congestive heart failure) (HCC)   . Diabetes mellitus without complication (HCC)   . Environmental allergies   . Fibromyalgia   . GERD (gastroesophageal reflux disease)   . Headache    migraines - 5x/mo  . Hypertension   . Hypokalemia 10/14/2017  . Migraines   . Motion sickness    ships  . Sleep apnea   . Stroke [facility](HCC)    no residual deficits  . Thyroid nodule    bilateral and goiter  . Wears contact lenses   . Wears dentures    partial upper    Patient Active Problem List   Diagnosis Date Noted  . GI bleed 10/02/2018  . Acute on chronic diastolic (congestive) heart failure (HCC) 07/17/2018  . Acute on chronic heart failure (HCC) 07/08/2018  . Chronic diastolic heart failure (HCC) 04/17/2018  . Lymphedema 04/17/2018  . Chest pain 03/28/2018  . Hypertension 10/15/2017  . Wears dentures 10/15/2017  . Symptomatic cholelithiasis 04/02/2017  . Recurrent incisional hernia 03/17/2017  . Calculus of gallbladder without cholecystitis without obstruction 03/17/2017  .  Helicobacter pylori gastritis 03/17/2017  . Depression 09/28/2016  . Diabetes mellitus, type 2 (HCC) 09/28/2016  . Tremor 07/01/2013  . Esophageal reflux 06/22/2012  . Major depressive disorder, single episode 08/22/2009  . Sleep apnea 05/30/2009  . Hyperlipidemia 08/10/2008    Past Surgical History:  Procedure Laterality Date  . ABDOMINAL HYSTERECTOMY  2000's  . ABDOMINAL SURGERY     laparoscopy x4 with lysis of adhesions  . APPENDECTOMY  1986  . CESAREAN SECTION  1992  . CHOLECYSTECTOMY N/A 04/03/2017   Procedure: LAPAROSCOPIC CHOLECYSTECTOMY;  Surgeon: Henrene DodgePiscoya, Jose, MD;  Location: [facility] ORS;  Service: General;  Laterality: N/A;  . COLONOSCOPY N/A 10/06/2018   Procedure: COLONOSCOPY;  Surgeon: Toney ReilVanga, Rohini Reddy, MD;  [facility];  Service: Gastroenterology;  Laterality: N/A;  . COLONOSCOPY WITH PROPOFOL N/A 03/10/2017   Procedure: COLONOSCOPY WITH PROPOFOL;  Surgeon: Scot JunElliott, Robert T, MD;  [facility];  Service: Endoscopy;  Laterality: N/A;  . ECTOPIC PREGNANCY SURGERY  2000's  . ESOPHAGOGASTRODUODENOSCOPY (EGD) WITH PROPOFOL N/A 03/10/2017   Procedure: ESOPHAGOGASTRODUODENOSCOPY (EGD) WITH PROPOFOL;  Surgeon: Scot JunElliott, Robert T, MD;  [facility];  Service: Endoscopy;  Laterality: N/A;  . HERNIA REPAIR    . JOINT REPLACEMENT Right 12/16/12   knee- medial - makoplasty  . KNEE ARTHROSCOPY Right 2006   partial medial and lateral meniscectomies  . KNEE ARTHROSCOPY Right 10/06/2015   Procedure: RIGHT KNEE ARTHROSCOPY WITH DEBRIDEMENT;  Surgeon: Erin SonsHarold Kernodle, MD;  [facility];  Service: Orthopedics;  Laterality: Right;  Diabetic - insulin and oral meds  . ROTATOR CUFF REPAIR Left 2006  . TUBAL LIGATION  2000's    Allergies Penicillins and Ace inhibitors  Social History Social History   Tobacco Use  . Smoking status: Never Smoker  . Smokeless tobacco: Never Used  Substance Use Topics  . Alcohol use: No  . Drug use: No    Review of Systems Constitutional: Negative for fever. Cardiovascular: Negative for chest pain. Respiratory: Negative for shortness of breath. Gastrointestinal: Negative for abdominal pain, vomiting and diarrhea. Musculoskeletal: Negative for back pain. Skin: Negative for rash. Neurological: Positive for weakness  All systems negative/normal/unremarkable except as stated in the HPI  ____________________________________________   PHYSICAL EXAM:  VITAL SIGNS: ED Triage Vitals [10/10/18 1101]  Enc Vitals Group     BP (!) 64/37     Pulse Rate 62     Resp 18     Temp 97.9 F (36.6 C)     Temp Source Oral     SpO2      Weight      Height      Head Circumference      Peak Flow      Pain Score      Pain Loc      Pain Edu?      Excl. in GC?    Constitutional: Drowsy but oriented, mild to moderate distress Eyes: Conjunctivae are normal. Normal extraocular movements. ENT      Head: Normocephalic and atraumatic.      Nose: No congestion/rhinnorhea.      Mouth/Throat: Mucous membranes are moist.      Neck: No stridor. Cardiovascular: Slow rate, regular rhythm. No murmurs, rubs, or gallops. Respiratory: Normal respiratory effort without tachypnea nor retractions. Breath sounds are clear and equal bilaterally. No wheezes/rales/rhonchi. Gastrointestinal: Soft and nontender. Normal bowel sounds Musculoskeletal: Nontender with normal range of motion in extremities. No lower extremity tenderness nor edema. Neurologic:  Normal speech and language. No gross focal neurologic deficits are appreciated.  Skin:  Skin is warm, dry and intact. No rash noted. Psychiatric: Mood and affect are normal. Speech and behavior are normal.  ____________________________________________  EKG: Interpreted by me.  Sinus rhythm with a rate of 64 bpm, normal PR interval, normal QRS, left axis deviation  ____________________________________________  ED COURSE:  As part of my medical decision making, I  reviewed the following data within the electronic MEDICAL RECORD NUMBER History obtained from family if available, nursing notes, old chart and ekg, as well as notes from prior ED visits. Patient presented for hypotension and hyperglycemia, we will assess with labs and imaging as indicated at this time.   Procedures ____________________________________________   LABS (pertinent positives/negatives)  Labs Reviewed  COMPREHENSIVE METABOLIC PANEL - Abnormal; Notable for the following components:      Result Value   Sodium 130 (*)    Chloride 88 (*)    Glucose, Bld 501 (*)    BUN 36 (*)    Creatinine, Ser 3.53 (*)    GFR calc non Af Amer 14 (*)    GFR calc Af Amer 16 (*)    Anion gap 16 (*)    All other components within normal limits  BLOOD GAS, VENOUS - Abnormal; Notable for the following components:   pCO2, Ven 34 (*)    Bicarbonate 16.3 (*)    Acid-base deficit 9.3 (*)    All other components within normal limits  GLUCOSE, CAPILLARY - Abnormal; Notable for the following components:  Glucose-Capillary 476 (*)    All other components within normal limits  CULTURE, BLOOD (ROUTINE X 2)  CULTURE, BLOOD (ROUTINE X 2)  CBC WITH DIFFERENTIAL/PLATELET  TROPONIN I  BETA-HYDROXYBUTYRIC ACID  LACTIC ACID, PLASMA  URINALYSIS, COMPLETE (UACMP) WITH MICROSCOPIC  CBG MONITORING, ED   CRITICAL CARE Performed by: Ulice DashJohnathan E Williams   Total critical care time: 30 minutes  Critical care time was exclusive of separately billable procedures and treating other patients.  Critical care was necessary to treat or prevent imminent or life-threatening deterioration.  Critical care was time spent personally by me on the following activities: development of treatment plan with patient and/or surrogate as well as nursing, discussions with consultants, evaluation of patient's response to treatment, examination of patient, obtaining history from patient or surrogate, ordering and performing treatments  and interventions, ordering and review of laboratory studies, ordering and review of radiographic studies, pulse oximetry and re-evaluation of patient's condition.  RADIOLOGY Images were viewed by me  Chest x-ray Is unremarkable ____________________________________________   DIFFERENTIAL DIAGNOSIS   DKA, overdose, medication side effect, sepsis, adrenal insufficiency  FINAL ASSESSMENT AND PLAN  Hypotension, hyperglycemia, acute renal failure   Plan: The patient had presented for hypotension and hyperglycemia. Patient's labs did reveal acute renal failure.  Reactant increased from 1-3.5.  The remainder of her labs were surprisingly reassuring with no white count or lactic acid elevation.  This is likely dehydration, she was recently told to increase her Lasix and she had amlodipine and losartan added to her medication regimen.  We have started IV fluid hydration here with improvement in her blood pressure.  She is awake and alert at this time.  Patient's imaging was reassuring.  I did discuss with nephrology who agrees with this plan.  She may need pressors if her blood pressure drops further.   Ulice DashJohnathan E Williams, MD    Note: This note was generated in part or whole with voice recognition software. Voice recognition is usually quite accurate but there are transcription errors that can and very often do occur. I apologize for any typographical errors that were not detected and corrected.     Emily FilbertWilliams, Jonathan E, MD 10/10/18 [phone]

## 2018-10-10 NOTE — Progress Notes (Signed)
Admitted from the ED with hypotension.  She has received 4,000 ml of NS since admission.  Has larger urine output. During that time. 2,000 ml.  States she feels much better than she did this morning.

## 2018-10-10 NOTE — H&P (Addendum)
[facility] [place] [facility]   PATIENT NAME: Amanda [name]    MR#:  [id number]  DATE OF BIRTH:  02/15/1966  DATE OF ADMISSION:  10/10/2018  PRIMARY CARE PHYSICIAN: Emogene Morgan, MD   REQUESTING/REFERRING PHYSICIAN: Dr. Mayford Knife.  CHIEF COMPLAINT:   Chief Complaint  Patient presents with  . Hyperglycemia  . Hypotension  . Weakness  . Altered Mental Status   Dizziness, syncope and generalized weakness today. HISTORY OF PRESENT ILLNESS:  Amanda [name]  is a 53 y.o. female with a known history of recent GI bleeding, CHF, hypertension, diabetes, GERD, stroke, OSA, migraine etc. The patient was recently admitted for GI bleeding.  She started to feel dizziness when she stands up at home today.  She said she passed out several times according to her daughter.  She also complains of abdominal pain, which is diffuse and a 10 out of 10 at home.  She said that she has nausea and abdominal pain during  and since last admission for GI bleeding.  But she denies any melena, bloody stool or hematuria.  She was very drowsy when the patient was sent to ED.  She is found hypotension at 60s and renal failure with creatinine up to 3.53 in the ED.  Her blood sugar is more than 500.  The patient was given normal saline bolus 3 L blood pressure is up to 94/47.  Dr. Mayford Knife requested admission. PAST MEDICAL HISTORY:   Past Medical History:  Diagnosis Date  . Arthritis    knees, shoulder, upper back  . Asthma   . CHF (congestive heart failure) (HCC)   . Diabetes mellitus without complication (HCC)   . Environmental allergies   . Fibromyalgia   . GERD (gastroesophageal reflux disease)   . Headache    migraines - 5x/mo  . Hypertension   . Hypokalemia 10/14/2017  . Migraines   . Motion sickness    ships  . Sleep apnea   . Stroke [facility])    no residual deficits  . Thyroid nodule    bilateral and goiter  . Wears contact lenses   . Wears dentures    partial upper     PAST SURGICAL HISTORY:   Past Surgical History:  Procedure Laterality Date  . ABDOMINAL HYSTERECTOMY  2000's  . ABDOMINAL SURGERY     laparoscopy x4 with lysis of adhesions  . APPENDECTOMY  1986  . CESAREAN SECTION  1992  . CHOLECYSTECTOMY N/A 04/03/2017   Procedure: LAPAROSCOPIC CHOLECYSTECTOMY;  Surgeon: Henrene Dodge, MD;  Location: [facility] ORS;  Service: General;  Laterality: N/A;  . COLONOSCOPY N/A 10/06/2018   Procedure: COLONOSCOPY;  Surgeon: Toney Reil, MD;  Location: [facility] ENDOSCOPY;  Service: Gastroenterology;  Laterality: N/A;  . COLONOSCOPY WITH PROPOFOL N/A 03/10/2017   Procedure: COLONOSCOPY WITH PROPOFOL;  Surgeon: Scot Jun, MD;  Location: [facility] ENDOSCOPY;  Service: Endoscopy;  Laterality: N/A;  . ECTOPIC PREGNANCY SURGERY  2000's  . ESOPHAGOGASTRODUODENOSCOPY (EGD) WITH PROPOFOL N/A 03/10/2017   Procedure: ESOPHAGOGASTRODUODENOSCOPY (EGD) WITH PROPOFOL;  Surgeon: Scot Jun, MD;  Location: [facility] ENDOSCOPY;  Service: Endoscopy;  Laterality: N/A;  . HERNIA REPAIR    . JOINT REPLACEMENT Right 12/16/12   knee- medial - makoplasty  . KNEE ARTHROSCOPY Right 2006   partial medial and lateral meniscectomies  . KNEE ARTHROSCOPY Right 10/06/2015   Procedure: RIGHT KNEE ARTHROSCOPY WITH DEBRIDEMENT;  Surgeon: Erin Sons, MD;  [facility];  Service: Orthopedics;  Laterality: Right;  Diabetic - insulin and oral meds  . ROTATOR CUFF REPAIR Left 2006  . TUBAL LIGATION  2000's    SOCIAL HISTORY:   Social History   Tobacco Use  . Smoking status: Never Smoker  . Smokeless tobacco: Never Used  Substance Use Topics  . Alcohol use: No    FAMILY HISTORY:   Family History  Problem Relation Age of Onset  . Hypertension Mother   . Hypertension Father   . Diabetes Father   . Allergies Father   . Kidney disease Father   . Breast cancer Neg Hx     DRUG ALLERGIES:   Allergies  Allergen Reactions  . Penicillins Anaphylaxis    Has patient  had a PCN reaction causing immediate rash, facial/tongue/throat swelling, SOB or lightheadedness with hypotension: Yes Has patient had a PCN reaction causing severe rash involving mucus membranes or skin necrosis: No Has patient had a PCN reaction that required hospitalization No Has patient had a PCN reaction occurring within the last 10 years: No If all of the above answers are "NO", then may proceed with Cephalosporin use.   . Ace Inhibitors Swelling    REVIEW OF SYSTEMS:   Review of Systems  Constitutional: Positive for malaise/fatigue. Negative for chills and fever.  HENT: Negative for sore throat.   Eyes: Negative for blurred vision and double vision.  Respiratory: Negative for cough, hemoptysis, shortness of breath, wheezing and stridor.   Cardiovascular: Negative for chest pain, palpitations, orthopnea and leg swelling.  Gastrointestinal: Positive for abdominal pain and nausea. Negative for blood in stool, diarrhea, melena and vomiting.  Genitourinary: Negative for dysuria, flank pain and hematuria.  Musculoskeletal: Negative for back pain and joint pain.  Skin: Negative for rash.  Neurological: Positive for dizziness. Negative for sensory change, focal weakness, seizures, loss of consciousness, weakness and headaches.  Endo/Heme/Allergies: Negative for polydipsia.  Psychiatric/Behavioral: Negative for depression. The patient is not nervous/anxious.     MEDICATIONS AT HOME:   Prior to Admission medications   Medication Sig Start Date End Date Taking? Authorizing Provider  albuterol (PROVENTIL HFA) 108 (90 Base) MCG/ACT inhaler Inhale 2 puffs into the lungs every 4 (four) hours as needed for wheezing or shortness of breath. 07/21/18  Yes Shane Crutch, MD  amLODipine (NORVASC) 5 MG tablet Take 1 tablet (5 mg total) by mouth daily. 10/06/18  Yes Altamese Dilling, MD  ARIPiprazole (ABILIFY) 2 MG tablet Take 2 mg by mouth daily.    Yes [provider]   Ascorbic Acid (VITAMIN C) 1000 MG tablet Take 500 mg by mouth 2 (two) times daily.    Yes [provider]  BREO ELLIPTA 200-25 MCG/INH AEPB Inhale 1 puff into the lungs daily. Patient taking differently: Inhale 1 puff into the lungs daily.  04/23/18  Yes Shane Crutch, MD  canagliflozin (INVOKANA) 300 MG TABS tablet Take 300 mg by mouth daily before breakfast.   Yes [provider]  clonazePAM (KLONOPIN) 0.5 MG tablet Take 0.5 mg by mouth 2 (two) times daily.   Yes [provider]  DULoxetine (CYMBALTA) 60 MG capsule Take 60 mg by mouth 2 (two) times daily.   Yes [provider]  furosemide (LASIX) 20 MG tablet Take 1 tablet (20 mg total) by mouth daily. 07/20/18 10/10/18 Yes Pyreddy, Vivien Rota, MD  gabapentin (NEURONTIN) 300 MG capsule Take 600 mg by mouth 2 (two) times daily. 03/22/18  Yes [provider]  hydrocortisone (ANUSOL-HC) 25 MG suppository Place 1 suppository (25 mg total) rectally  2 (two) times daily. 10/06/18  Yes Altamese DillingVachhani, Vaibhavkumar, MD  insulin detemir (LEVEMIR) 100 UNIT/ML injection Inject 45 Units into the skin daily.    Yes [provider]  ipratropium-albuterol (DUONEB) 0.5-2.5 (3) MG/3ML SOLN Inhale contents of 1 vial via nebulizer 3 times daily as needed for shortness of breath/wheeze/cough. Patient taking differently: Inhale 3 mLs into the lungs 3 (three) times daily as needed (shortness of breath, wheezing or cough).  11/21/17  Yes Shane Crutchamachandran, Pradeep, MD  isosorbide mononitrate (IMDUR) 30 MG 24 hr tablet Take 1 tablet (30 mg total) by mouth daily. 10/17/17  Yes Johnson, Maylene Roeslanford L, MD  LINZESS 290 MCG CAPS capsule Take 1 tablet by mouth daily before breakfast.  02/28/17  Yes [provider]  losartan (COZAAR) 100 MG tablet Take 1 tablet (100 mg total) by mouth daily. 10/07/18  Yes Altamese DillingVachhani, Vaibhavkumar, MD  metoprolol succinate (TOPROL-XL) 25 MG 24 hr tablet Take 1 tablet (25 mg total) by mouth daily. Take with  or immediately following a meal. 07/20/18 10/10/18 Yes Pyreddy, Pavan, MD  mirtazapine (REMERON SOL-TAB) 30 MG disintegrating tablet Take 30 mg by mouth daily.  03/18/18  Yes [provider]  montelukast (SINGULAIR) 10 MG tablet Take 1 tablet (10 mg total) by mouth daily. 07/21/18 10/10/18 Yes Shane Crutchamachandran, Pradeep, MD  Multiple Vitamin (MULTIVITAMIN) tablet Take 1 tablet by mouth daily.   Yes [provider]  omeprazole (PRILOSEC) 40 MG capsule Take 1 capsule (40 mg total) by mouth daily. 1 hr before supper 04/05/17  Yes Piscoya, Jose, MD  Potassium Chloride ER 20 MEQ TBCR Take 20 mEq by mouth daily. Take with lasix 09/30/18  Yes [provider]  pravastatin (PRAVACHOL) 80 MG tablet Take 1 tablet (80 mg total) by mouth daily. 10/17/17 06/04/19 Yes Johnson, Clanford L, MD  pregabalin (LYRICA) 150 MG capsule Take 150 mg by mouth 3 (three) times daily.   Yes [provider]  senna-docusate (SENOKOT-S) 8.6-50 MG tablet Take 1 tablet by mouth 2 (two) times daily. 10/06/18  Yes Altamese DillingVachhani, Vaibhavkumar, MD  sitaGLIPtin (JANUVIA) 100 MG tablet Take 100 mg by mouth daily.   Yes [provider]  sucralfate (CARAFATE) 1 g tablet Take 1 g by mouth 2 (two) times daily.    Yes [provider]  SUMAtriptan (IMITREX) 25 MG tablet Take 25 mg by mouth every 2 (two) hours as needed for migraine. May repeat in 2 hours if headache persists or recurs.   Yes [provider]  tiotropium (SPIRIVA HANDIHALER) 18 MCG inhalation capsule Place 1 capsule (18 mcg total) into inhaler and inhale daily. 06/24/18 06/24/19 Yes Shane Crutchamachandran, Pradeep, MD  tiZANidine (ZANAFLEX) 4 MG tablet Take 4 mg by mouth every 8 (eight) hours as needed for muscle spasms.    Yes [provider]  traMADol (ULTRAM) 50 MG tablet Take 50 mg by mouth every 6 (six) hours as needed for moderate pain.   Yes [provider]  zolpidem (AMBIEN) 10 MG tablet Take 10 mg by mouth at bedtime as  needed for sleep.    Yes [provider]  flunisolide (NASALIDE) 25 MCG/ACT (0.025%) SOLN Place 2 sprays into the nose daily as needed.    [provider]  HYDROcodone-acetaminophen (NORCO/VICODIN) 5-325 MG tablet Take 1-2 tablets by mouth at bedtime as needed for moderate pain.    [provider]      VITAL SIGNS:  Blood pressure (!) 85/51, pulse 72, temperature 97.9 F (36.6 C), temperature source Oral, resp. rate [name] Kitchen(!)  21, height 5\' 5"  (1.651 m), weight 88.9 kg, SpO2 100 %.  PHYSICAL EXAMINATION:  Physical Exam  GENERAL:  53 y.o.-year-old patient lying in the bed with no acute distress.  Obesity. EYES: Pupils equal, round, reactive to light and accommodation. No scleral icterus. Extraocular muscles intact.  HEENT: Head atraumatic, normocephalic. Oropharynx and nasopharynx clear.  NECK:  Supple, no jugular venous distention. No thyroid enlargement, no tenderness.  LUNGS: Normal breath sounds bilaterally, no wheezing, rales,rhonchi or crepitation. No use of accessory muscles of respiration.  CARDIOVASCULAR: S1, S2 normal. No murmurs, rubs, or gallops.  ABDOMEN: Soft, diffuse tenderness, nondistended. Bowel sounds present. No organomegaly or mass.  EXTREMITIES: No pedal edema, cyanosis, or clubbing.  NEUROLOGIC: Cranial nerves II through XII are intact. Muscle strength 5/5 in all extremities. Sensation intact. Gait not checked.  PSYCHIATRIC: The patient is alert and oriented x 3.  SKIN: No obvious rash, lesion, or ulcer.   LABORATORY PANEL:   CBC Recent Labs  Lab 10/10/18 1058  WBC 6.0  HGB 14.1  HCT 44.5  PLT 256   ------------------------------------------------------------------------------------------------------------------  Chemistries  Recent Labs  Lab 10/10/18 1058  NA 130*  K 4.2  CL 88*  CO2 26  GLUCOSE 501*  BUN 36*  CREATININE 3.53*  CALCIUM 9.5  AST 39  ALT 40  ALKPHOS 111  BILITOT 0.8    ------------------------------------------------------------------------------------------------------------------  Cardiac Enzymes Recent Labs  Lab 10/10/18 1058  TROPONINI <0.03   ------------------------------------------------------------------------------------------------------------------  RADIOLOGY:  Dg Chest 1 View  Result Date: 10/10/2018 CLINICAL DATA:  Altered mental status EXAM: CHEST  1 VIEW COMPARISON:  Chest radiograph October 02, 2018 and chest CT October 05, 2018 FINDINGS: There is no edema or consolidation. The heart size and pulmonary vascularity are normal. No adenopathy. No bone lesions. IMPRESSION: No edema or consolidation. Electronically Signed   By: Bretta Bang III M.D.   On: 10/10/2018 12:07      IMPRESSION AND PLAN:   Hypotension. The patient will be admitted to telemetry floor. Hold hypertension medication. The patient was given 2 L normal saline, continue IV.  Follow-up vital sign.  Acute renal failure due to hypotension. Hold all hypertension medication including ACE inhibitor and diuretics. Follow-up abdominal CT, BMP and nephrology consult.  Hyperglycemia due to diabetes 2. Start sliding scale, continue Levemir.  Hold Invokana.  Hyponatremia.  Possible pseudo-hyponatremia due to hyperglycemia.  Follow-up BMP.   Abdominal pain.  Unclear etiology.  Follow-up CT of the abdomen and pelvis.  History of chronic diastolic CHF, ejection fraction 55 to 60%.  Stable.  Hold diuretics and ACE inhibitor.  OSA and obesity.  On CPAP at night.  All the records are reviewed and case discussed with ED provider. Management plans discussed with the patient, family and they are in agreement.  CODE STATUS: Full code.  TOTAL TIME TAKING CARE OF THIS PATIENT: 35 minutes.    Shaune Pollack M.D on 10/10/2018 at 12:35 PM  Between 7am to 6pm - Pager - [phone]  After 6pm go to [email or web address] - Scientist, research (life sciences) [facility]   Office  [phone]  CC: Primary care physician; Emogene Morgan, MD   Note: This dictation was prepared with Dragon dictation along with smaller phrase technology. Any transcriptional errors that result from this process are unin

## 2018-10-10 NOTE — ED Notes (Signed)
Pt returned from CT and called out for BR.  Arrived in pt room to find her in bathroom and bed stripped of sheets.  Pt states she could not wait and wet the bed and needs her gown changed.  Pt assisted back to bed, linen changed including gown, BP remains stable.  Explained to pt the reasoning for not wanting her to get up out of bed by herself.  Pt verbalizes understanding of all information shared.  Will continue to monitor closely.

## 2018-10-11 LAB — URINALYSIS, COMPLETE (UACMP) WITH MICROSCOPIC
Bilirubin Urine: NEGATIVE
Glucose, UA: >=500 mg/dL — AB
Hgb urine dipstick: NEGATIVE
Ketones, ur: 5 mg/dL — AB
Leukocytes, UA: NEGATIVE
Nitrite: NEGATIVE
Protein, ur: NEGATIVE mg/dL
Specific Gravity, Urine: 1.014 (ref 1.005–1.030)
pH: 5 (ref 5.0–8.0)

## 2018-10-11 LAB — CBC
HCT: 44.7 % (ref 36.0–46.0)
Hemoglobin: 14.3 g/dL (ref 12.0–15.0)
MCH: 29.5 pg (ref 26.0–34.0)
MCHC: 32 g/dL (ref 30.0–36.0)
MCV: 92.2 fL (ref 80.0–100.0)
Platelets: 274 10*3/uL (ref 150–400)
RBC: 4.85 MIL/uL (ref 3.87–5.11)
RDW: 14.7 % (ref 11.5–15.5)
WBC: 7.3 10*3/uL (ref 4.0–10.5)
nRBC: 0 % (ref 0.0–0.2)

## 2018-10-11 LAB — BASIC METABOLIC PANEL
Anion gap: 12 (ref 5–15)
BUN: 31 mg/dL — ABNORMAL HIGH (ref 6–20)
CO2: 31 mmol/L (ref 22–32)
Calcium: 9.4 mg/dL (ref 8.9–10.3)
Chloride: 92 mmol/L — ABNORMAL LOW (ref 98–111)
Creatinine, Ser: 1.79 mg/dL — ABNORMAL HIGH (ref 0.44–1.00)
GFR calc Af Amer: 37 mL/min — ABNORMAL LOW (ref >60)
GFR calc non Af Amer: 32 mL/min — ABNORMAL LOW (ref >60)
Glucose, Bld: 141 mg/dL — ABNORMAL HIGH (ref 70–99)
Potassium: 2.8 mmol/L — ABNORMAL LOW (ref 3.5–5.1)
Sodium: 135 mmol/L (ref 135–145)

## 2018-10-11 LAB — GLUCOSE, CAPILLARY
Glucose-Capillary: 149 mg/dL — ABNORMAL HIGH (ref 70–99)
Glucose-Capillary: 298 mg/dL — ABNORMAL HIGH (ref 70–99)
Glucose-Capillary: 312 mg/dL — ABNORMAL HIGH (ref 70–99)
Glucose-Capillary: 171 mg/dL — ABNORMAL HIGH (ref 70–99)
Glucose-Capillary: 199 mg/dL — ABNORMAL HIGH (ref 70–99)

## 2018-10-11 LAB — POTASSIUM: Potassium: 3.4 mmol/L — ABNORMAL LOW (ref 3.5–5.1)

## 2018-10-11 MED ORDER — POTASSIUM CHLORIDE CRYS ER 20 MEQ PO TBCR
40.0000 meq | EXTENDED_RELEASE_TABLET | ORAL | Status: AC
  Administered 2018-10-11 (×2): 40 meq via ORAL
  Filled 2018-10-11 (×2): qty 2

## 2018-10-11 NOTE — Progress Notes (Signed)
[employer] - [place] at [facility]   PATIENT NAME: Idalia Clasen    MR#:  [id number]  DATE OF BIRTH:  08/26/1966  SUBJECTIVE:  CHIEF COMPLAINT:   Chief Complaint  Patient presents with  . Hyperglycemia  . Hypotension  . Weakness  . Altered Mental Status  Patient seen and evaluated today No confusion Tolerating diet well Has generalized weakness On oxygen via nasal cannula REVIEW OF SYSTEMS:    ROS  CONSTITUTIONAL: No documented fever. Has fatigue, weakness. No weight gain, no weight loss.  EYES: No blurry or double vision.  ENT: No tinnitus. No postnasal drip. No redness of the oropharynx.  RESPIRATORY: No cough, no wheeze, no hemoptysis. No dyspnea.  CARDIOVASCULAR: No chest pain. No orthopnea. No palpitations. No syncope.  GASTROINTESTINAL: No nausea, no vomiting or diarrhea. No abdominal pain. No melena or hematochezia.  GENITOURINARY: No dysuria or hematuria.  ENDOCRINE: No polyuria or nocturia. No heat or cold intolerance.  HEMATOLOGY: No anemia. No bruising. No bleeding.  INTEGUMENTARY: No rashes. No lesions.  MUSCULOSKELETAL: No arthritis. No swelling. No gout.  NEUROLOGIC: No numbness, tingling, or ataxia. No seizure-type activity.  PSYCHIATRIC: No anxiety. No insomnia. No ADD.   DRUG ALLERGIES:   Allergies  Allergen Reactions  . Penicillins Anaphylaxis    Has patient had a PCN reaction causing immediate rash, facial/tongue/throat swelling, SOB or lightheadedness with hypotension: Yes Has patient had a PCN reaction causing severe rash involving mucus membranes or skin necrosis: No Has patient had a PCN reaction that required hospitalization No Has patient had a PCN reaction occurring within the last 10 years: No If all of the above answers are "NO", then may proceed with Cephalosporin use.   . Ace Inhibitors Swelling    VITALS:  Blood pressure 120/70, pulse 67, temperature 97.7 F (36.5 C), temperature source Oral, resp. rate 19, height  5\' 5"  (1.651 m), weight 88.9 kg, SpO2 99 %.  PHYSICAL EXAMINATION:   Physical Exam  GENERAL:  53 y.o.-year-old patient lying in the bed with no acute distress.  EYES: Pupils equal, round, reactive to light and accommodation. No scleral icterus. Extraocular muscles intact.  HEENT: Head atraumatic, normocephalic. Oropharynx and nasopharynx clear.  NECK:  Supple, no jugular venous distention. No thyroid enlargement, no tenderness.  LUNGS: Normal breath sounds bilaterally, no wheezing, rales, rhonchi. No use of accessory muscles of respiration.  CARDIOVASCULAR: S1, S2 normal. No murmurs, rubs, or gallops.  ABDOMEN: Soft, nontender, nondistended. Bowel sounds present. No organomegaly or mass.  EXTREMITIES: No cyanosis, clubbing or edema b/l.    NEUROLOGIC: Cranial nerves II through XII are intact. No focal Motor or sensory deficits b/l.   PSYCHIATRIC: The patient is alert and oriented x 3.  SKIN: No obvious rash, lesion, or ulcer.   LABORATORY PANEL:   CBC Recent Labs  Lab 10/11/18 0417  WBC 7.3  HGB 14.3  HCT 44.7  PLT 274   ------------------------------------------------------------------------------------------------------------------ Chemistries  Recent Labs  Lab 10/10/18 1058  10/11/18 0417  NA 130*  --  135  K 4.2  --  2.8*  CL 88*  --  92*  CO2 26  --  31  GLUCOSE 501*  --  141*  BUN 36*  --  31*  CREATININE 3.53*   < > 1.79*  CALCIUM 9.5  --  9.4  AST 39  --   --   ALT 40  --   --   ALKPHOS 111  --   --   BILITOT 0.8  --   --    < > =  values in this interval not displayed.   ------------------------------------------------------------------------------------------------------------------  Cardiac Enzymes Recent Labs  Lab 10/10/18 1058  TROPONINI <0.03   ------------------------------------------------------------------------------------------------------------------  RADIOLOGY:  Ct Abdomen Pelvis Wo Contrast  Result Date: 10/10/2018 CLINICAL DATA:  Pt  arrives via ems from home, pt was admitted last week and reports admitted for bleeding hemorrhoids that required repair. Pt is hyperglycemic of 535 for ems and hypotensive with a systolic in the 60's. The patient is sleepy her family thought she was having a stroke. History of hernia repair, cholecystectomy, partial hysterectomy. EXAM: CT ABDOMEN AND PELVIS WITHOUT CONTRAST TECHNIQUE: Multidetector CT imaging of the abdomen and pelvis was performed following the standard protocol without IV contrast. COMPARISON:  CT of the abdomen and pelvis on 10/24/2016 FINDINGS: Lower chest: There is minimal bibasilar atelectasis. Trace pericardial effusion is 7 millimeters maximum diameter. Hepatobiliary: Marked, diffuse low-attenuation of the liver consistent with hepatic steatosis, new since study from February 2018. The liver is enlarged. Known focal lesions are obscured. Cholecystectomy Pancreas: Unremarkable. No pancreatic ductal dilatation or surrounding inflammatory changes. Spleen: Normal in size without focal abnormality. Adrenals/Urinary Tract: Adrenal glands are normal in appearance. Symmetric size of the kidneys bilaterally. No urinary tract obstruction. Distended urinary bladder. Stomach/Bowel: The stomach and small bowel loops are normal in appearance. Appendix is absent. The colon is unremarkable. Vascular/Lymphatic: No significant vascular findings are present. No enlarged abdominal or pelvic lymph nodes. Reproductive: Hysterectomy. No adnexal mass. Other: No abdominal wall hernia or abnormality. No free pelvic fluid. Anterior abdominal wall hernia repairs. Small fat containing hernias are identified in the umbilical region and supraumbilical region. Musculoskeletal: Misregistration artifact in the region of the sternum, consistent patient motion. No acute bony abnormalities. IMPRESSION: 1. No bowel obstruction or abscess. 2. New hepatomegaly and hepatic steatosis, suspicious for acute injury. 3. Stable small  pericardial effusion. 4. Distended urinary bladder. 5. Small fat containing umbilical and supraumbilical hernias. Electronically Signed   By: Norva PavlovElizabeth  Brown M.D.   On: 10/10/2018 13:06   Dg Chest 1 View  Result Date: 10/10/2018 CLINICAL DATA:  Altered mental status EXAM: CHEST  1 VIEW COMPARISON:  Chest radiograph October 02, 2018 and chest CT October 05, 2018 FINDINGS: There is no edema or consolidation. The heart size and pulmonary vascularity are normal. No adenopathy. No bone lesions. IMPRESSION: No edema or consolidation. Electronically Signed   By: Bretta BangWilliam  Woodruff III M.D.   On: 10/10/2018 12:07     ASSESSMENT AND PLAN:  53 year old female patient with history of chronic congestive heart failure, type 2 diabetes mellitus, hypertension, GERD, CVA, migraine, fibromyalgia currently under hospitalist service for low blood pressure  -Acute hypotension improved With IV fluids Monitor vital signs  -Acute kidney injury improving Monitor renal function ACE inhibitor and diuretics on hold Nephrology consult if needed  -Type 2 diabetes mellitus Sliding scale coverage with insulin along with Levemir insulin and diabetic diet  -Hyponatremia Improved  -Acute hypokalemia Replace potassium aggressively Follow potassium level  -Chronic diastolic heart failure with EF of 55 to 60% Diuretics on hold secondary to hypotension ACE inhibitor on hold secondary to renal failure and hypotension  -Obesity and obstructive sleep apnea CPAP at bedtime   All the records are reviewed and case discussed with Care Management/Social Worker. Management plans discussed with the patient, family and they are in agreement.  CODE STATUS:Full code  DVT Prophylaxis: SCDs  TOTAL TIME TAKING CARE OF THIS PATIENT: 36 minutes.   POSSIBLE D/C IN 2 to 3 DAYS, DEPENDING ON CLINICAL CONDITION.  Ihor Austin M.D on 10/11/2018 at 12:05 PM  Between 7am to 6pm - Pager - [phone]  After 6pm go to  [email or web address] - password [email or web address] [facility]  [facility]  Office  [phone]  CC: Primary care physician; Emogene Morgan, MD  Note: This dictation was prepared with Dragon dictation along with smaller phrase technology. Any transcriptional errors that result from this process are unintentional.

## 2018-10-11 NOTE — Progress Notes (Signed)
Patient's AM potassium 2.8, down from 4.2 on admission. Notified MD Sheryle Hail and MD to place orders for PO replacement. Nursing staff will continue to monitor for any changes in patient status. Lamonte Richer, RN

## 2018-10-11 NOTE — Plan of Care (Signed)
  Problem: Clinical Measurements: Goal: Ability to maintain clinical measurements within normal limits will improve Outcome: Not Progressing Note:  Patient's K+ level was critically low this AM at only 2.8. Replaced orally throughout shift. Will continue to monitor lab values. Amanda [name] [facility]

## 2018-10-12 LAB — BASIC METABOLIC PANEL
Anion gap: 13 (ref 5–15)
BUN: 30 mg/dL — ABNORMAL HIGH (ref 6–20)
CO2: 27 mmol/L (ref 22–32)
Calcium: 9.8 mg/dL (ref 8.9–10.3)
Chloride: 96 mmol/L — ABNORMAL LOW (ref 98–111)
Creatinine, Ser: 1.3 mg/dL — ABNORMAL HIGH (ref 0.44–1.00)
GFR calc Af Amer: 54 mL/min — ABNORMAL LOW (ref >60)
GFR calc non Af Amer: 47 mL/min — ABNORMAL LOW (ref >60)
Glucose, Bld: 224 mg/dL — ABNORMAL HIGH (ref 70–99)
Potassium: 3.9 mmol/L (ref 3.5–5.1)
Sodium: 136 mmol/L (ref 135–145)

## 2018-10-12 LAB — GLUCOSE, CAPILLARY
Glucose-Capillary: 230 mg/dL — ABNORMAL HIGH (ref 70–99)
Glucose-Capillary: 271 mg/dL — ABNORMAL HIGH (ref 70–99)
Glucose-Capillary: 276 mg/dL — ABNORMAL HIGH (ref 70–99)
Glucose-Capillary: 230 mg/dL — ABNORMAL HIGH (ref 70–99)
Glucose-Capillary: 167 mg/dL — ABNORMAL HIGH (ref 70–99)

## 2018-10-12 MED ORDER — FUROSEMIDE 20 MG PO TABS: 20.0000 mg | ORAL_TABLET | Freq: Every day | ORAL | Status: DC

## 2018-10-12 MED ORDER — METOPROLOL SUCCINATE ER 25 MG PO TB24
25.0000 mg | ORAL_TABLET | Freq: Every day | ORAL | Status: DC
  Administered 2018-10-12: 25 mg via ORAL
  Filled 2018-10-12: qty 1

## 2018-10-12 MED ORDER — AMLODIPINE BESYLATE 5 MG PO TABS: 5.0000 mg | ORAL_TABLET | Freq: Every day | ORAL | Status: DC

## 2018-10-12 MED ORDER — INSULIN DETEMIR 100 UNIT/ML ~~LOC~~ SOLN
50.0000 [IU] | Freq: Every day | SUBCUTANEOUS | Status: DC
  Administered 2018-10-12: 50 [IU] via SUBCUTANEOUS
  Filled 2018-10-12 (×2): qty 0.5

## 2018-10-12 MED ORDER — INSULIN ASPART 100 UNIT/ML ~~LOC~~ SOLN
4.0000 [IU] | Freq: Three times a day (TID) | SUBCUTANEOUS | Status: DC
  Administered 2018-10-12 – 2018-10-13 (×3): 4 [IU] via SUBCUTANEOUS
  Filled 2018-10-12 (×3): qty 1

## 2018-10-12 MED ORDER — METOPROLOL SUCCINATE ER 25 MG PO TB24: 25.0000 mg | ORAL_TABLET | Freq: Every day | ORAL | Status: DC

## 2018-10-12 MED ORDER — METOPROLOL TARTRATE 5 MG/5ML IV SOLN
5.0000 mg | Freq: Once | INTRAVENOUS | Status: AC
  Administered 2018-10-12: 5 mg via INTRAVENOUS
  Filled 2018-10-12: qty 5

## 2018-10-12 MED ORDER — LOSARTAN POTASSIUM 50 MG PO TABS: 50.0000 mg | ORAL_TABLET | Freq: Every day | ORAL | Status: DC

## 2018-10-12 MED ORDER — SODIUM CHLORIDE 0.9 % IV BOLUS
500.0000 mL | Freq: Once | INTRAVENOUS | Status: AC
  Administered 2018-10-12: 500 mL via INTRAVENOUS

## 2018-10-12 NOTE — Progress Notes (Signed)
Inpatient Diabetes Program Recommendations  AACE/ADA: New Consensus Statement on Inpatient Glycemic Control (2015)  Target Ranges:  Prepandial:   less than 140 mg/dL      Peak postprandial:   less than 180 mg/dL (1-2 hours)      Critically ill patients:  140 - 180 mg/dL   Results for JARIAH, COOKSTON (MRN [id number]) as of 10/12/2018 12:08  Ref. Range 10/11/2018 08:03 10/11/2018 11:50 10/11/2018 11:52 10/11/2018 16:32 10/11/2018 21:01  Glucose-Capillary Latest Ref Range: 70 - 99 mg/dL 222 (H)  2 units NOVOLOG  298 (H) 312 (H)  11 units NOVOLOG  171 (H)  3 units NOVOLOG  199 (H)     45 units LEVEMIR   Results for SAAVI, LAWTON (MRN [id number]) as of 10/12/2018 12:08  Ref. Range 10/12/2018 07:56 10/12/2018 12:03  Glucose-Capillary Latest Ref Range: 70 - 99 mg/dL 417 (H)  5 units NOVOLOG  271 (H)     Admit Acute renal failure due to Hypotension/ Hyperglycemia.   History: DM, CVA  Home DM Meds: Levemir 45 units Daily        Januvia 100 mg Daily       Invokana 300 mg Daily  Current Orders: Levemir 45 units QHS       Novolog Moderate Correction Scale/ SSI (0-15 units) TID AC + HS      Levemir just started last PM.  CBGs >200 mg/dl today.    MD- Please consider the following in-hospital insulin adjustments while home oral DM meds are on hold:  1. Increase Levemir to 50 units QHS  2. Start Novolog Meal Coverage: Novolog 4 units TID with meals  (Please add the following Hold Parameters: Hold if pt eats <50% of meal, Hold if pt NPO)    --Will follow patient during hospitalization--  Ambrose Finland RN, MSN, CDE Diabetes Coordinator Inpatient Glycemic Control Team Team Pager: [phone] (8a-5p)

## 2018-10-12 NOTE — Care Management Note (Signed)
Case Management Note  Patient Details  Name: Amanda [name] MRN: [id number] Date of Birth: 1966/04/06  Subjective/Objective:    Patient is from home; she takes care of her son who is bipolar and has schizophrenia.  When she is not well she stays with her sister.  She will DC to her sisters house when discharged.  Discharge order cancelled as her heart rate increased to 160's while up to bathroom.  She has required acute O2 this visit but has been weaned.  She uses a CPAP machine HS at home.  Obtains medications at [facility] in [place].  CM consult for trouble affording medications.  When CM asked patient about this she states that she was having a hard time with "[facility]"  She plans to have all of her medications transferred to [facility] in [place].  Has used [facility] in the past and  Would like to use them again for [facility] services.  Referral made to [facility] with [facility] for RN and PT.  No further needs identified at this time.  Sister will transport home when discharged.            Action/Plan:   Expected Discharge Date:  10/12/18               Expected Discharge Plan:  Home w Home Health Services  In-House Referral:     Discharge planning Services  CM Consult  Post Acute Care Choice:  Home Health Choice offered to:  Patient  DME Arranged:    DME Agency:     HH Arranged:  RN, PT HH Agency:  [facility]  Status of Service:  Completed, signed off  If discussed at Long Length of Stay Meetings, dates discussed:    Additional Comments:  Sherren Kerns, RN 10/12/2018, 12:09 PM

## 2018-10-12 NOTE — Progress Notes (Signed)
[facility]  Address: [address] [place], [place] [address] Phone: [phone]  Patient will be staying with her daughter  If patient discharges after hours, please call [phone].   Amanda [name] 10/12/2018, 12:24 PM

## 2018-10-12 NOTE — Progress Notes (Signed)
[employer] - [place] at [facility]   PATIENT NAME: Irem Autery    MR#:  [id number]  DATE OF BIRTH:  12/24/1965  SUBJECTIVE:  CHIEF COMPLAINT:   Chief Complaint  Patient presents with  . Hyperglycemia  . Hypotension  . Weakness  . Altered Mental Status  Patient seen and evaluated today No confusion Tolerating diet well Has generalized weakness Has tachycardia On oxygen via nasal cannula REVIEW OF SYSTEMS:    ROS  CONSTITUTIONAL: No documented fever. Has fatigue, weakness. No weight gain, no weight loss.  EYES: No blurry or double vision.  ENT: No tinnitus. No postnasal drip. No redness of the oropharynx.  RESPIRATORY: No cough, no wheeze, no hemoptysis. No dyspnea.  CARDIOVASCULAR: No chest pain. No orthopnea. No palpitations. No syncope.  GASTROINTESTINAL: No nausea, no vomiting or diarrhea. No abdominal pain. No melena or hematochezia.  GENITOURINARY: No dysuria or hematuria.  ENDOCRINE: No polyuria or nocturia. No heat or cold intolerance.  HEMATOLOGY: No anemia. No bruising. No bleeding.  INTEGUMENTARY: No rashes. No lesions.  MUSCULOSKELETAL: No arthritis. No swelling. No gout.  NEUROLOGIC: No numbness, tingling, or ataxia. No seizure-type activity.  PSYCHIATRIC: No anxiety. No insomnia. No ADD.   DRUG ALLERGIES:   Allergies  Allergen Reactions  . Penicillins Anaphylaxis    Has patient had a PCN reaction causing immediate rash, facial/tongue/throat swelling, SOB or lightheadedness with hypotension: Yes Has patient had a PCN reaction causing severe rash involving mucus membranes or skin necrosis: No Has patient had a PCN reaction that required hospitalization No Has patient had a PCN reaction occurring within the last 10 years: No If all of the above answers are "NO", then may proceed with Cephalosporin use.   . Ace Inhibitors Swelling    VITALS:  Blood pressure (!) 131/100, pulse (!) 101, temperature 98.7 F (37.1 C), temperature source  Oral, resp. rate 18, height 5\' 5"  (1.651 m), weight 88.9 kg, SpO2 94 %.  PHYSICAL EXAMINATION:   Physical Exam  GENERAL:  53 y.o.-year-old patient lying in the bed with no acute distress.  EYES: Pupils equal, round, reactive to light and accommodation. No scleral icterus. Extraocular muscles intact.  HEENT: Head atraumatic, normocephalic. Oropharynx and nasopharynx clear.  NECK:  Supple, no jugular venous distention. No thyroid enlargement, no tenderness.  LUNGS: Normal breath sounds bilaterally, no wheezing, rales, rhonchi. No use of accessory muscles of respiration.  CARDIOVASCULAR: S1, S2 tachycardia noted. No murmurs, rubs, or gallops.  ABDOMEN: Soft, nontender, nondistended. Bowel sounds present. No organomegaly or mass.  EXTREMITIES: No cyanosis, clubbing or edema b/l.    NEUROLOGIC: Cranial nerves II through XII are intact. No focal Motor or sensory deficits b/l.   PSYCHIATRIC: The patient is alert and oriented x 3.  SKIN: No obvious rash, lesion, or ulcer.   LABORATORY PANEL:   CBC Recent Labs  Lab 10/11/18 0417  WBC 7.3  HGB 14.3  HCT 44.7  PLT 274   ------------------------------------------------------------------------------------------------------------------ Chemistries  Recent Labs  Lab 10/10/18 1058  10/12/18 0459  NA 130*   < > 136  K 4.2   < > 3.9  CL 88*   < > 96*  CO2 26   < > 27  GLUCOSE 501*   < > 224*  BUN 36*   < > 30*  CREATININE 3.53*   < > 1.30*  CALCIUM 9.5   < > 9.8  AST 39  --   --   ALT 40  --   --  ALKPHOS 111  --   --   BILITOT 0.8  --   --    < > = values in this interval not displayed.   ------------------------------------------------------------------------------------------------------------------  Cardiac Enzymes Recent Labs  Lab 10/10/18 1058  TROPONINI <0.03   ------------------------------------------------------------------------------------------------------------------  RADIOLOGY:  No results  found.   ASSESSMENT AND PLAN:  53 year old female patient with history of chronic congestive heart failure, type 2 diabetes mellitus, hypertension, GERD, CVA, migraine, fibromyalgia currently under hospitalist service for low blood pressure  -Acute hypotension improved Resolved with IV fluids  -Tachycardia Resume metoprolol for rate control  -Acute kidney injury improved Monitor renal function ACE inhibitor and diuretics will be resumed  -Type 2 diabetes mellitus Sliding scale coverage with insulin along with Levemir insulin and diabetic diet  -Hyponatremia Improved  -Acute hypokalemia resolved  -Chronic diastolic heart failure with EF of 55 to 60% Resume diuresis with Lasix Letter electrolytes  -Obesity and obstructive sleep apnea CPAP at bedtime   All the records are reviewed and case discussed with Care Management/Social Worker. Management plans discussed with the patient, family and they are in agreement.  CODE STATUS:Full code  DVT Prophylaxis: SCDs  TOTAL TIME TAKING CARE OF THIS PATIENT: 35 minutes.   POSSIBLE D/C IN 2 to 3 DAYS, DEPENDING ON CLINICAL CONDITION.  Ihor AustinPavan Samyria Rudie M.D on 10/12/2018 at 1:57 PM  Between 7am to 6pm - Pager - [phone]  After 6pm go to [email or web address] - password [email or web address] [facility]  [facility] [place] [facility]  Office  [phone]  CC: Primary care physician; Emogene MorganAycock, Ngwe A, MD  Note: This dictation was prepared with Dragon dictation along with smaller phrase technology. Any transcriptional errors that result from this process are unintentional.

## 2018-10-12 NOTE — Progress Notes (Signed)
Pt continue to feel dizzy/ states " I fell like im going to pass out"/ pt alert and following commands/BP low- all other VS are normal/ FSBS 276/ Dr. Tobi Bastos made aware/ orders to stop all BP meds and give bolus/ will continue to monitor.

## 2018-10-12 NOTE — Progress Notes (Addendum)
pts HR sustaining  in 140s at rest/ pt asymptomatic / Dr. Tobi Bastos paged to make aware/ orders given to restart home dose of metoprolol / discontinue discharge/ will continue to monitor.

## 2018-10-12 NOTE — Progress Notes (Signed)
Walked pt around nursing station/ Pt c/o dizziness and sob with exertion/ symptoms resolved when back in bed/ 02 sats 94% on RA / BP soft / Dr. Tobi Bastos paged to make aware/  Orders to hold BP meds for now / Will continue to monitor.

## 2018-10-13 LAB — GLUCOSE, CAPILLARY
Glucose-Capillary: 137 mg/dL — ABNORMAL HIGH (ref 70–99)
Glucose-Capillary: 168 mg/dL — ABNORMAL HIGH (ref 70–99)

## 2018-10-13 NOTE — Plan of Care (Signed)
  Problem: Education: Goal: Knowledge of General Education information will improve Description Including pain rating scale, medication(s)/side effects and non-pharmacologic comfort measures Outcome: Progressing   Problem: Health Behavior/Discharge Planning: Goal: Ability to manage health-related needs will improve Outcome: Progressing   Problem: Clinical Measurements: Goal: Ability to maintain clinical measurements within normal limits will improve Outcome: Progressing Goal: Will remain free from infection Outcome: Progressing Note:  Remains afebrile Goal: Diagnostic test results will improve Outcome: Progressing Note:  Potassium 3.9 BUN 30/1.3 today Goal: Respiratory complications will improve Outcome: Progressing   Problem: Pain Managment: Goal: General experience of comfort will improve Outcome: Progressing   Problem: Safety: Goal: Ability to remain free from injury will improve Outcome: Progressing

## 2018-10-13 NOTE — Care Management Note (Signed)
Case Management Note  Patient Details  Name: Amanda [name] MRN: [id number] Date of Birth: 1966-01-22  Subjective/Objective:     Amanda [name] with [facility] is aware of DC today.                 Action/Plan:   Expected Discharge Date:  10/13/18               Expected Discharge Plan:  Home w Home Health Services  In-House Referral:     Discharge planning Services  CM Consult  Post Acute Care Choice:  Home Health Choice offered to:  Patient  DME Arranged:    DME Agency:     HH Arranged:  RN, PT HH Agency:  [facility]  Status of Service:  Completed, signed off  If discussed at Long Length of Stay Meetings, dates discussed:    Additional Comments:  Sherren Kerns, RN 10/13/2018, 3:24 PM

## 2018-10-13 NOTE — Progress Notes (Signed)
Ambulated patient around nurses station, oxygen 96-98% on ra.  Pt very unsteady on feet.  Towards end of walk pt was feeling short of breath and tired.  Dr. Tobi Bastos updated. No new orders received.

## 2018-10-13 NOTE — Discharge Summary (Signed)
[facility] [place] [facility]   PATIENT NAME: Amanda [name]    MR#:  [id number]  DATE OF BIRTH:  [birth year]  DATE OF ADMISSION:  10/10/2018 ADMITTING PHYSICIAN: Shaune PollackQing Chen, MD  DATE OF DISCHARGE: 10/13/2018  PRIMARY CARE PHYSICIAN: Emogene MorganAycock, Ngwe A, MD   ADMISSION DIAGNOSIS:  Dehydration [E86.0] Hyperglycemia [R73.9] Acute renal failure, unspecified acute renal failure type (HCC) [N17.9]  DISCHARGE DIAGNOSIS:  Active Problems:   Hypotension Dehydration Acute kidney injury Type 2 diabetes mellitus Acute hypokalemia Hyponatremia Chronic diastolic heart failure with EF of 55 to 60% Obstructive sleep apnea SECONDARY DIAGNOSIS:   Past Medical History:  Diagnosis Date  . Arthritis    knees, shoulder, upper back  . Asthma   . CHF (congestive heart failure) (HCC)   . Diabetes mellitus without complication (HCC)   . Environmental allergies   . Fibromyalgia   . GERD (gastroesophageal reflux disease)   . Headache    migraines - 5x/mo  . Hypertension   . Hypokalemia 10/14/2017  . Migraines   . Motion sickness    ships  . Sleep apnea   . Stroke [facility](HCC)    no residual deficits  . Thyroid nodule    bilateral and goiter  . Wears contact lenses   . Wears dentures    partial upper     ADMITTING HISTORY Amanda [name]  is a 53 y.o. female with a known history of recent GI bleeding, CHF, hypertension, diabetes, GERD, stroke, OSA, migraine etc.The patient was recently admitted for GI bleeding.  She started to feel dizziness when she stands up at home today.  She said she passed out several times according to her daughter.  She also complains of abdominal pain, which is diffuse and a 10 out of 10 at home.  She said that she has nausea and abdominal pain during  and since last admission for GI bleeding.  But she denies any melena, bloody stool or hematuria.  She was very drowsy when the patient was sent to ED.  She is found hypotension at 60s and renal failure with  creatinine up to 3.53 in the ED.  Her blood sugar is more than 500.  The patient was given normal saline bolus 3 L blood pressure is up to 94/47.  Dr. Mayford KnifeWilliams requested admission.  HOSPITAL COURSE:  Patient was admitted to the telemetry.  Her blood pressure medications were held.  She received IV fluids.  Her dizziness improved.  Her blood pressure also improved.  Renal function improved with IV fluids.  Hyponatremia got corrected.  Hypokalemia was also corrected with potassium replacement.  Patient will be discharged home with oral Norvasc.  We will hold off on diuretics for now and other blood pressure medications.  Patient to be evaluated by cardiologist Dr. Milta DeitersKhan's office and further reinstitute the medications.  Patient will be discharged home with home health services.  Troponin was negative during this work-up.  At the time of admission creatinine was 3.53 and it came down to 1.30 at the time of discharge.  Patient had a recent echocardiogram in October 04, 2018 which was reviewed.  CONSULTS OBTAINED:    DRUG ALLERGIES:   Allergies  Allergen Reactions  . Penicillins Anaphylaxis    Has patient had a PCN reaction causing immediate rash, facial/tongue/throat swelling, SOB or lightheadedness with hypotension: Yes Has patient had a PCN reaction causing severe rash involving mucus membranes or skin necrosis: No Has patient had a PCN reaction that required hospitalization No Has  patient had a PCN reaction occurring within the last 10 years: No If all of the above answers are "NO", then may proceed with Cephalosporin use.   . Ace Inhibitors Swelling    DISCHARGE MEDICATIONS:   Allergies as of 10/13/2018      Reactions   Penicillins Anaphylaxis   Has patient had a PCN reaction causing immediate rash, facial/tongue/throat swelling, SOB or lightheadedness with hypotension: Yes Has patient had a PCN reaction causing severe rash involving mucus membranes or skin necrosis: No Has patient had a  PCN reaction that required hospitalization No Has patient had a PCN reaction occurring within the last 10 years: No If all of the above answers are "NO", then may proceed with Cephalosporin use.   Ace Inhibitors Swelling      Medication List    STOP taking these medications   furosemide 20 MG tablet Commonly known as:  LASIX   HYDROcodone-acetaminophen 5-325 MG tablet Commonly known as:  NORCO/VICODIN   losartan 100 MG tablet Commonly known as:  COZAAR   metoprolol succinate 25 MG 24 hr tablet Commonly known as:  TOPROL-XL   traMADol 50 MG tablet Commonly known as:  ULTRAM     TAKE these medications   albuterol 108 (90 Base) MCG/ACT inhaler Commonly known as:  PROVENTIL HFA Inhale 2 puffs into the lungs every 4 (four) hours as needed for wheezing or shortness of breath.   amLODipine 5 MG tablet Commonly known as:  NORVASC Take 1 tablet (5 mg total) by mouth daily.   ARIPiprazole 2 MG tablet Commonly known as:  ABILIFY Take 2 mg by mouth daily.   BREO ELLIPTA 200-25 MCG/INH Aepb Generic drug:  fluticasone furoate-vilanterol Inhale 1 puff into the lungs daily. What changed:  See the new instructions.   clonazePAM 0.5 MG tablet Commonly known as:  KLONOPIN Take 0.5 mg by mouth 2 (two) times daily.   DULoxetine 60 MG capsule Commonly known as:  CYMBALTA Take 60 mg by mouth 2 (two) times daily.   flunisolide 25 MCG/ACT (0.025%) Soln Commonly known as:  NASALIDE Place 2 sprays into the nose daily as needed.   gabapentin 300 MG capsule Commonly known as:  NEURONTIN Take 600 mg by mouth 2 (two) times daily.   hydrocortisone 25 MG suppository Commonly known as:  ANUSOL-HC Place 1 suppository (25 mg total) rectally 2 (two) times daily.   insulin detemir 100 UNIT/ML injection Commonly known as:  LEVEMIR Inject 45 Units into the skin daily.   INVOKANA 300 MG Tabs tablet Generic drug:  canagliflozin Take 300 mg by mouth daily before breakfast.    ipratropium-albuterol 0.5-2.5 (3) MG/3ML Soln Commonly known as:  DUONEB Inhale contents of 1 vial via nebulizer 3 times daily as needed for shortness of breath/wheeze/cough. What changed:  See the new instructions.   isosorbide mononitrate 30 MG 24 hr tablet Commonly known as:  IMDUR Take 1 tablet (30 mg total) by mouth daily.   LINZESS 290 MCG Caps capsule Generic drug:  linaclotide Take 1 tablet by mouth daily before breakfast.   mirtazapine 30 MG disintegrating tablet Commonly known as:  REMERON SOL-TAB Take 30 mg by mouth daily.   montelukast 10 MG tablet Commonly known as:  SINGULAIR Take 1 tablet (10 mg total) by mouth daily.   multivitamin tablet Take 1 tablet by mouth daily.   omeprazole 40 MG capsule Commonly known as:  PRILOSEC Take 1 capsule (40 mg total) by mouth daily. 1 hr before supper   Potassium  Chloride ER 20 MEQ Tbcr Take 20 mEq by mouth daily. Take with lasix   pravastatin 80 MG tablet Commonly known as:  PRAVACHOL Take 1 tablet (80 mg total) by mouth daily.   pregabalin 150 MG capsule Commonly known as:  LYRICA Take 150 mg by mouth 3 (three) times daily.   senna-docusate 8.6-50 MG tablet Commonly known as:  Senokot-S Take 1 tablet by mouth 2 (two) times daily.   sitaGLIPtin 100 MG tablet Commonly known as:  JANUVIA Take 100 mg by mouth daily.   sucralfate 1 g tablet Commonly known as:  CARAFATE Take 1 g by mouth 2 (two) times daily.   SUMAtriptan 25 MG tablet Commonly known as:  IMITREX Take 25 mg by mouth every 2 (two) hours as needed for migraine. May repeat in 2 hours if headache persists or recurs.   tiotropium 18 MCG inhalation capsule Commonly known as:  SPIRIVA HANDIHALER Place 1 capsule (18 mcg total) into inhaler and inhale daily.   tiZANidine 4 MG tablet Commonly known as:  ZANAFLEX Take 4 mg by mouth every 8 (eight) hours as needed for muscle spasms.   vitamin C 1000 MG tablet Take 500 mg by mouth 2 (two) times  daily.   zolpidem 10 MG tablet Commonly known as:  AMBIEN Take 10 mg by mouth at bedtime as needed for sleep.       Today  Seen and evaluated today Tolerating diet well No dizziness No shortness of breath Hemodynamically stable Will be discharged home  VITAL SIGNS:  Blood pressure (!) 137/92, pulse 93, temperature 98.5 F (36.9 C), temperature source Oral, resp. rate 18, height 5\' 5"  (1.651 m), weight 88.9 kg, SpO2 97 %.  I/O:    Intake/Output Summary (Last 24 hours) at 10/13/2018 1427 Last data filed at 10/13/2018 1347 Gross per 24 hour  Intake 1443 ml  Output 1800 ml  Net -357 ml    PHYSICAL EXAMINATION:  Physical Exam  GENERAL:  53 y.o.-year-old patient lying in the bed with no acute distress.  LUNGS: Normal breath sounds bilaterally, no wheezing, rales,rhonchi or crepitation. No use of accessory muscles of respiration.  CARDIOVASCULAR: S1, S2 normal. No murmurs, rubs, or gallops.  ABDOMEN: Soft, non-tender, non-distended. Bowel sounds present. No organomegaly or mass.  NEUROLOGIC: Moves all 4 extremities. PSYCHIATRIC: The patient is alert and oriented x 3.  SKIN: No obvious rash, lesion, or ulcer.   DATA REVIEW:   CBC Recent Labs  Lab 10/11/18 0417  WBC 7.3  HGB 14.3  HCT 44.7  PLT 274    Chemistries  Recent Labs  Lab 10/10/18 1058  10/12/18 0459  NA 130*   < > 136  K 4.2   < > 3.9  CL 88*   < > 96*  CO2 26   < > 27  GLUCOSE 501*   < > 224*  BUN 36*   < > 30*  CREATININE 3.53*   < > 1.30*  CALCIUM 9.5   < > 9.8  AST 39  --   --   ALT 40  --   --   ALKPHOS 111  --   --   BILITOT 0.8  --   --    < > = values in this interval not displayed.    Cardiac Enzymes Recent Labs  Lab 10/10/18 1058  TROPONINI <0.03    Microbiology Results  Results for orders placed or performed during the hospital encounter of 10/10/18  Blood culture (routine x 2)  Status: None (Preliminary result)   Collection Time: 10/10/18 11:12 AM  Result Value Ref  Range Status   Specimen Description BLOOD LAC  Final   Special Requests   Final    BOTTLES DRAWN AEROBIC AND ANAEROBIC Blood Culture adequate volume   Culture   Final    NO GROWTH 3 DAYS Performed at [facility], [address] [place], [place] [address]    Report Status PENDING  Incomplete  Blood culture (routine x 2)     Status: None (Preliminary result)   Collection Time: 10/10/18  4:53 PM  Result Value Ref Range Status   Specimen Description BLOOD BLOOD RIGHT HAND  Final   Special Requests   Final    BOTTLES DRAWN AEROBIC ONLY Blood Culture adequate volume   Culture   Final    NO GROWTH 3 DAYS Performed at [facility], [address] [place], [place] [address]    Report Status PENDING  Incomplete    RADIOLOGY:  No results found.  Follow up with PCP in 1 week.  Management plans discussed with the patient, family and they are in agreement.  CODE STATUS: Full code    Code Status Orders  (From admission, onward)         Start     Ordered   10/10/18 1615  Full code  Continuous     10/10/18 1614        Code Status History    Date Active Date Inactive Code Status Order ID Comments User Context   10/02/2018 1708 10/06/2018 1859 Full Code [id number]  [place] SirenSainani, Vivek J, MD Inpatient   07/17/2018 1714 07/20/2018 1810 Full Code [id number]  Shaune Pollackhen, Qing, MD Inpatient   03/28/2018 2134 03/30/2018 1557 Full Code [id number]  Enid BaasKalisetti, Radhika, MD Inpatient   10/14/2017 1002 10/17/2017 1341 Full Code [id number]  Eddie Northhungel, Nishant, MD Inpatient   06/03/2017 1631 06/06/2017 1556 Full Code [id number]  Adrian SaranMody, Sital, MD Inpatient   04/02/2017 1505 04/05/2017 1912 Full Code [id number]  Henrene DodgePiscoya, Jose, MD Inpatient   03/18/2016 1553 03/20/2016 1748 Full Code [id number]  Katha HammingKonidena, Snehalatha, MD ED      TOTAL TIME TAKING CARE OF THIS PATIENT ON DAY OF DISCHARGE: more than 35 minutes.   Ihor AustinPavan Davonne Baby M.D on 10/13/2018 at 2:27 PM  Between 7am to 6pm - Pager - [phone]  After 6pm  go to [email or web address] - password [email or web address] [facility]  [facility]  Office  [phone]  CC: Primary care physician; Emogene MorganAycock, Ngwe A, MD  Note: This dictation was prepared with Dragon dictation along with smaller phrase technology. Any transcriptional errors that result from this process are unintentional.

## 2018-10-14 LAB — BLOOD GAS, VENOUS
Acid-base deficit: 9.3 mmol/L — ABNORMAL HIGH (ref 0.0–2.0)
Bicarbonate: 16.3 mmol/L — ABNORMAL LOW (ref 20.0–28.0)
O2 Saturation: 48.5 %
Patient temperature: 37
pCO2, Ven: 34 mmHg — ABNORMAL LOW (ref 44.0–60.0)
pH, Ven: 7.29 (ref 7.250–7.430)

## 2018-10-15 LAB — CULTURE, BLOOD (ROUTINE X 2)
Culture: NO GROWTH
Culture: NO GROWTH
Special Requests: ADEQUATE
Special Requests: ADEQUATE

## 2018-10-17 NOTE — Discharge Summary (Signed)
[facility] - [place] at [facility]   PATIENT NAME: Amanda [name]    MR#:  [id number]  DATE OF BIRTH:  November 22, 1965  DATE OF ADMISSION:  10/02/2018 ADMITTING PHYSICIAN: Houston Siren, MD  DATE OF DISCHARGE: 10/06/2018  3:53 PM  PRIMARY CARE PHYSICIAN: Emogene Morgan, MD    ADMISSION DIAGNOSIS:  Abnormal LFTs [R94.5] Gastrointestinal hemorrhage, unspecified gastrointestinal hemorrhage type [K92.2]  DISCHARGE DIAGNOSIS:  Active Problems:   GI bleed   SECONDARY DIAGNOSIS:   Past Medical History:  Diagnosis Date  . Arthritis    knees, shoulder, upper back  . Asthma   . CHF (congestive heart failure) (HCC)   . Diabetes mellitus without complication (HCC)   . Environmental allergies   . Fibromyalgia   . GERD (gastroesophageal reflux disease)   . Headache    migraines - 5x/mo  . Hypertension   . Hypokalemia 10/14/2017  . Migraines   . Motion sickness    ships  . Sleep apnea   . Stroke [facility])    no residual deficits  . Thyroid nodule    bilateral and goiter  . Wears contact lenses   . Wears dentures    partial upper    HOSPITAL COURSE:   53 y.o.femalewith a known history of diabetes, fibromyalgia, GERD, hypertension, anxiety, depression, struct of sleep apnea, history of previous CVA, asthma who presents to the hospital due to multiple episodes of rectal bleeding, generalized weakness and shortness of breath.  1. GI bleed- suspected to be a lower GI bleed given patient's multiple episodes of rectal bleeding. Patient does have a history of hemorrhoids. As per the ER physician the rectal exam was maroon stool which was heme positive. -Hemoglobin currently stable, on IV fluids, follow hemoglobin.   Appreciated gastroenterology consult.   -Now patient have cardiology clearance for any GI procedures, -Give Protonix. - Noted external and internal hemorrhoids on colonoscopy, advised to follow out pt GI for bandings.  2. Tachycardia-patient  has sinus tachycardia with heart rates in the 120s to 130s. I suspect this is secondary to dehydration from the GI bleed. Continue IV fluid hydration, follow heart rate. Started on small dose metoprolol as blood pressure was also elevated.  Stable now.  3. Shortness of breath-etiology unclear, patient is not hypoxic. She is tachycardic. No clinical evidence of congestive heart failure. -will cont. To monitor.  Chest x-ray clear. -She is not hypoxic or tachypneic now.  She had this complaint of shortness of breath with minimal exertion chronically. Still awaited report of echocardiogram. CT scan chest angiogram is negative for any acute findings.  4.Diabetes type 2 without complication- -We will place patient on sliding scale insulin for now. -Resume Levemir and now increasing dose as with increasing oral intake her blood sugar is going higher now.  5. Essential hypertension- As blood pressure is running high now, resume losartan, continue metoprolol and Lasix, also added amlodipine.  6. Diabetic neuropathy-continue gabapentin, Lyrica.  7. Anxiety/depression-continue Abilify, Cymbalta, Klonopin.  8. Abnormal LFTs-etiology unclear presently. Will follow LFTs- improving, no abnormalities on right upper quadrant ultrasound.  9. Hyperlipidemia-continue Pravachol.  10. GERD-continue Protonix.  11.  Anginal chest pain-she follows with Dr. [name] in clinic for cardiology. As per cardiology, recent negative stress test in the clinic and advised no further cardiac work-up.  Patient is cleared and stabilized per cardiologist team for GI procedures.   DISCHARGE CONDITIONS:   Stable.  CONSULTS OBTAINED:  Treatment Team:  Laurier Nancy, MD  DRUG ALLERGIES:  Allergies  Allergen Reactions  . Penicillins Anaphylaxis    Has patient had a PCN reaction causing immediate rash, facial/tongue/throat swelling, SOB or lightheadedness with hypotension: Yes Has patient had  a PCN reaction causing severe rash involving mucus membranes or skin necrosis: No Has patient had a PCN reaction that required hospitalization No Has patient had a PCN reaction occurring within the last 10 years: No If all of the above answers are "NO", then may proceed with Cephalosporin use.   . Ace Inhibitors Swelling    DISCHARGE MEDICATIONS:   Allergies as of 10/06/2018      Reactions   Penicillins Anaphylaxis   Has patient had a PCN reaction causing immediate rash, facial/tongue/throat swelling, SOB or lightheadedness with hypotension: Yes Has patient had a PCN reaction causing severe rash involving mucus membranes or skin necrosis: No Has patient had a PCN reaction that required hospitalization No Has patient had a PCN reaction occurring within the last 10 years: No If all of the above answers are "NO", then may proceed with Cephalosporin use.   Ace Inhibitors Swelling      Medication List    STOP taking these medications   lidocaine 5 % Commonly known as:  LIDODERM   losartan 25 MG tablet Commonly known as:  COZAAR     TAKE these medications   albuterol 108 (90 Base) MCG/ACT inhaler Commonly known as:  PROVENTIL HFA Inhale 2 puffs into the lungs every 4 (four) hours as needed for wheezing or shortness of breath.   amLODipine 5 MG tablet Commonly known as:  NORVASC Take 1 tablet (5 mg total) by mouth daily.   ARIPiprazole 2 MG tablet Commonly known as:  ABILIFY Take 2 mg by mouth daily.   BREO ELLIPTA 200-25 MCG/INH Aepb Generic drug:  fluticasone furoate-vilanterol Inhale 1 puff into the lungs daily. What changed:  See the new instructions.   clonazePAM 0.5 MG tablet Commonly known as:  KLONOPIN Take 0.5 mg by mouth 2 (two) times daily.   DULoxetine 60 MG capsule Commonly known as:  CYMBALTA Take 60 mg by mouth 2 (two) times daily.   flunisolide 25 MCG/ACT (0.025%) Soln Commonly known as:  NASALIDE Place 2 sprays into the nose daily as needed.    gabapentin 300 MG capsule Commonly known as:  NEURONTIN Take 600 mg by mouth 2 (two) times daily.   hydrocortisone 25 MG suppository Commonly known as:  ANUSOL-HC Place 1 suppository (25 mg total) rectally 2 (two) times daily.   insulin detemir 100 UNIT/ML injection Commonly known as:  LEVEMIR Inject 45 Units into the skin daily.   INVOKANA 300 MG Tabs tablet Generic drug:  canagliflozin Take 300 mg by mouth daily before breakfast.   ipratropium-albuterol 0.5-2.5 (3) MG/3ML Soln Commonly known as:  DUONEB Inhale contents of 1 vial via nebulizer 3 times daily as needed for shortness of breath/wheeze/cough. What changed:  See the new instructions.   isosorbide mononitrate 30 MG 24 hr tablet Commonly known as:  IMDUR Take 1 tablet (30 mg total) by mouth daily.   LINZESS 290 MCG Caps capsule Generic drug:  linaclotide Take 1 tablet by mouth daily before breakfast.   mirtazapine 30 MG disintegrating tablet Commonly known as:  REMERON SOL-TAB Take 30 mg by mouth daily.   montelukast 10 MG tablet Commonly known as:  SINGULAIR Take 1 tablet (10 mg total) by mouth daily.   multivitamin tablet Take 1 tablet by mouth daily.   omeprazole 40 MG capsule Commonly known as:  PRILOSEC Take 1 capsule (40 mg total) by mouth daily. 1 hr before supper   Potassium Chloride ER 20 MEQ Tbcr Take 20 mEq by mouth daily. Take with lasix   pravastatin 80 MG tablet Commonly known as:  PRAVACHOL Take 1 tablet (80 mg total) by mouth daily.   pregabalin 150 MG capsule Commonly known as:  LYRICA Take 150 mg by mouth 3 (three) times daily.   senna-docusate 8.6-50 MG tablet Commonly known as:  Senokot-S Take 1 tablet by mouth 2 (two) times daily.   sitaGLIPtin 100 MG tablet Commonly known as:  JANUVIA Take 100 mg by mouth daily.   sucralfate 1 g tablet Commonly known as:  CARAFATE Take 1 g by mouth 2 (two) times daily.   SUMAtriptan 25 MG tablet Commonly known as:  IMITREX Take 25  mg by mouth every 2 (two) hours as needed for migraine. May repeat in 2 hours if headache persists or recurs.   tiotropium 18 MCG inhalation capsule Commonly known as:  SPIRIVA HANDIHALER Place 1 capsule (18 mcg total) into inhaler and inhale daily.   tiZANidine 4 MG tablet Commonly known as:  ZANAFLEX Take 4 mg by mouth every 8 (eight) hours as needed for muscle spasms.   vitamin C 1000 MG tablet Take 500 mg by mouth 2 (two) times daily.   zolpidem 10 MG tablet Commonly known as:  AMBIEN Take 10 mg by mouth at bedtime as needed for sleep.        DISCHARGE INSTRUCTIONS:    Follow in GI clinic.  If you experience worsening of your admission symptoms, develop shortness of breath, life threatening emergency, suicidal or homicidal thoughts you must seek medical attention immediately by calling 911 or calling your MD immediately  if symptoms less severe.  You Must read complete instructions/literature along with all the possible adverse reactions/side effects for all the Medicines you take and that have been prescribed to you. Take any new Medicines after you have completely understood and accept all the possible adverse reactions/side effects.   Please note  You were cared for by a hospitalist during your hospital stay. If you have any questions about your discharge medications or the care you received while you were in the hospital after you are discharged, you can call the unit and asked to speak with the hospitalist on call if the hospitalist that took care of you is not available. Once you are discharged, your primary care physician will handle any further medical issues. Please note that NO REFILLS for any discharge medications will be authorized once you are discharged, as it is imperative that you return to your primary care physician (or establish a relationship with a primary care physician if you do not have one) for your aftercare needs so that they can reassess your need for  medications and monitor your lab values.    Today   CHIEF COMPLAINT:   Chief Complaint  Patient presents with  . Weakness  . Rectal Bleeding  . Shortness of Breath    HISTORY OF PRESENT ILLNESS:  Amanda [name]  is a 53 y.o. female with a known history of diabetes, fibromyalgia, GERD, hypertension, anxiety, depression, struct of sleep apnea, history of previous CVA, asthma who presents to the hospital due to multiple episodes of rectal bleeding, generalized weakness and shortness of breath.  Patient says that she has had rectal bleeding ongoing for the past 2 weeks.  She is also complaining of some exertional dyspnea and some generalized weakness.  She presented to the ER was noted to be tachycardic, hemoglobin is stable, noted to have some abnormal LFTs.  Given her ongoing rectal bleeding hospitalist services were contacted for admission.  Patient denies any abdominal pain, fever, chills, nausea, vomiting, or any other associated symptoms presently.  Patient currently is not on any blood thinners.  She denies any alcohol or substance abuse.  VITAL SIGNS:  Blood pressure (!) 172/103, pulse 72, temperature (!) 97.4 F (36.3 C), temperature source Tympanic, resp. rate 18, height 5\' 5"  (1.651 m), weight 88.9 kg, SpO2 100 %.  I/O:  No intake or output data in the 24 hours ending 10/17/18 1434  PHYSICAL EXAMINATION:  GENERAL:  53 y.o.-year-old patient lying in the bed with no acute distress.  EYES: Pupils equal, round, reactive to light and accommodation. No scleral icterus. Extraocular muscles intact.  HEENT: Head atraumatic, normocephalic. Oropharynx and nasopharynx clear.  NECK:  Supple, no jugular venous distention. No thyroid enlargement, no tenderness.  LUNGS: Normal breath sounds bilaterally, no wheezing, rales,rhonchi or crepitation. No use of accessory muscles of respiration.  CARDIOVASCULAR: S1, S2 normal. No murmurs, rubs, or gallops.  ABDOMEN: Soft, mild right lower quadrant  tender, nondistended. Bowel sounds present. No organomegaly or mass.  EXTREMITIES: No pedal edema, cyanosis, or clubbing.  NEUROLOGIC: Cranial nerves II through XII are intact. Muscle strength 5/5 in all extremities. Sensation intact. Gait not checked.  PSYCHIATRIC: The patient is alert and oriented x 3.  SKIN: No obvious rash, lesion, or ulcer.   DATA REVIEW:   CBC Recent Labs  Lab 10/11/18 0417  WBC 7.3  HGB 14.3  HCT 44.7  PLT 274    Chemistries  Recent Labs  Lab 10/12/18 0459  NA 136  K 3.9  CL 96*  CO2 27  GLUCOSE 224*  BUN 30*  CREATININE 1.30*  CALCIUM 9.8    Cardiac Enzymes No results for input(s): TROPONINI in the last 168 hours.  Microbiology Results  Results for orders placed or performed during the hospital encounter of 10/14/17  Culture, blood (routine x 2)     Status: None   Collection Time: 10/14/17 11:58 AM  Result Value Ref Range Status   Specimen Description BLOOD LEFT ANTECUBITAL  Final   Special Requests   Final    BOTTLES DRAWN AEROBIC AND ANAEROBIC Blood Culture adequate volume   Culture   Final    NO GROWTH 5 DAYS Performed at [facility], [address] [place], [place] [address]    Report Status 10/19/2017 FINAL  Final  Culture, blood (routine x 2)     Status: None   Collection Time: 10/14/17 11:58 AM  Result Value Ref Range Status   Specimen Description BLOOD LEFT ARM  Final   Special Requests   Final    BOTTLES DRAWN AEROBIC ONLY Blood Culture results may not be optimal due to an inadequate volume of blood received in culture bottles   Culture   Final    NO GROWTH 5 DAYS Performed at [facility], [address] [place], [place] [address]    Report Status 10/19/2017 FINAL  Final  CSF culture     Status: None   Collection Time: 10/15/17  2:14 PM  Result Value Ref Range Status   Specimen Description   Final    CSF Performed at [facility], [address] [place], [place] [address]    Special Requests   Final     NONE Performed at [facility], [address],  [place], [place] [address]    Gram Stain   Final    NO ORGANISMS SEEN RARE WBC PRESENT, PREDOMINANTLY MONONUCLEAR    Culture   Final    NO GROWTH 3 DAYS Performed at [facility], [address] [place], [place] [address]    Report Status 10/19/2017 FINAL  Final    RADIOLOGY:  No results found.  EKG:   Orders placed or performed during the hospital encounter of 10/02/18  . EKG 12-Lead  . EKG 12-Lead  . ED EKG  . ED EKG  . EKG 12-Lead  . EKG 12-Lead  . EKG      Management plans discussed with the patient, family and they are in agreement.  CODE STATUS:  Code Status History    Date Active Date Inactive Code Status Order ID Comments User Context   10/10/2018 1614 10/13/2018 1939 Full Code [id number]  Shaune Pollack, MD Inpatient   10/02/2018 1708 10/06/2018 1859 Full Code [id number]  Houston Siren, MD Inpatient   07/17/2018 1714 07/20/2018 1810 Full Code [id number]  Shaune Pollack, MD Inpatient   03/28/2018 2134 03/30/2018 1557 Full Code [id number]  Enid Baas, MD Inpatient   10/14/2017 1002 10/17/2017 1341 Full Code [id number]  Eddie North, MD Inpatient   06/03/2017 1631 06/06/2017 1556 Full Code [id number]  Adrian Saran, MD Inpatient   04/02/2017 1505 04/05/2017 1912 Full Code [id number]  Henrene Dodge, MD Inpatient   03/18/2016 1553 03/20/2016 1748 Full Code [id number]  Katha Hamming, MD ED      TOTAL TIME TAKING CARE OF THIS PATIENT: 35 minutes.    Altamese Dilling M.D on 10/17/2018 at 2:34 PM  Between 7am to 6pm - Pager - [phone]  After 6pm go to [email or web address] - Social research officer, government  [facility]  Office  [phone]  CC: Primary care physician; Emogene Morgan, MD   Note: This dictation was prepared with Dragon dictation along with smaller phrase technology. Any transcriptional errors that result from this process are unintentional.

## 2018-10-21 ENCOUNTER — Telehealth: Payer: Self-pay | Admitting: Family

## 2018-10-21 NOTE — Telephone Encounter (Signed)
Patient called to say that she's gained 8 pounds in the last 2-3 days. When she was discharged from the hospital on 10/13/2018, she was kept off of her furosemide because she had been admitted on 10/10/2018 with dehydration and acute renal failure.   Renal function at discharge showed creatinine 1.3 and GFR 54. Advised patient to resume her furosemide but to resume at 20mg  daily. She was instructed to break her current 40mg  tablet in 1/2. She sees her cardiologist on 10/27/2018.   Will see patient in clinic tomorrow.

## 2018-10-22 ENCOUNTER — Ambulatory Visit: Payer: 59 | Admitting: Family

## 2018-10-23 ENCOUNTER — Ambulatory Visit: Payer: 59 | Admitting: Gastroenterology

## 2018-10-28 NOTE — Progress Notes (Deleted)
Patient ID: [name], female    DOB: [birth year], 53 y.o.   MRN: [id number]  HPI  [name] is a 53 y/o female with a history of asthma, DM, HTN, stroke, thyroid disease, GERD, fibromyalgia, migraines, obstructive sleep apnea and chronic heart failure.   Echo done 10/04/2018 but no EF to report. Echo report from 03/29/18 reviewed and showed an EF of 55-60%.  Admitted 10/10/2018 due to hypotension and dehydration. Blood pressure medications were held and IV fluids were given. Discharged after 3 days. Admitted 10/02/2018 due to GIB. Cardiology and GI consults were obtained. Colonoscopy done which showed external/ internal hemorrhoids. Given IV fluids. CT scan chest angiogram is negative. Discharged after 4 days.   She presents today for a follow-up visit with a chief complaint of   Past Medical History:  Diagnosis Date  . Arthritis    knees, shoulder, upper back  . Asthma   . CHF (congestive heart failure) (HCC)   . Diabetes mellitus without complication (HCC)   . Environmental allergies   . Fibromyalgia   . GERD (gastroesophageal reflux disease)   . Headache    migraines - 5x/mo  . Hypertension   . Hypokalemia 10/14/2017  . Migraines   . Motion sickness    ships  . Sleep apnea   . Stroke [facility])    no residual deficits  . Thyroid nodule    bilateral and goiter  . Wears contact lenses   . Wears dentures    partial upper   Past Surgical History:  Procedure Laterality Date  . ABDOMINAL HYSTERECTOMY  2000's  . ABDOMINAL SURGERY     laparoscopy x4 with lysis of adhesions  . APPENDECTOMY  1986  . CESAREAN SECTION  1992  . CHOLECYSTECTOMY N/A 04/03/2017   Procedure: LAPAROSCOPIC CHOLECYSTECTOMY;  Surgeon: Henrene Dodge, MD;  Location: [facility] ORS;  Service: General;  Laterality: N/A;  . COLONOSCOPY N/A 10/06/2018   Procedure: COLONOSCOPY;  Surgeon: Toney Reil, MD;  Location: [facility] ENDOSCOPY;  Service: Gastroenterology;  Laterality: N/A;  . COLONOSCOPY WITH PROPOFOL N/A  03/10/2017   Procedure: COLONOSCOPY WITH PROPOFOL;  Surgeon: Scot Jun, MD;  Location: [facility] ENDOSCOPY;  Service: Endoscopy;  Laterality: N/A;  . ECTOPIC PREGNANCY SURGERY  2000's  . ESOPHAGOGASTRODUODENOSCOPY (EGD) WITH PROPOFOL N/A 03/10/2017   Procedure: ESOPHAGOGASTRODUODENOSCOPY (EGD) WITH PROPOFOL;  Surgeon: Scot Jun, MD;  Location: [facility] ENDOSCOPY;  Service: Endoscopy;  Laterality: N/A;  . HERNIA REPAIR    . JOINT REPLACEMENT Right 12/16/12   knee- medial - makoplasty  . KNEE ARTHROSCOPY Right 2006   partial medial and lateral meniscectomies  . KNEE ARTHROSCOPY Right 10/06/2015   Procedure: RIGHT KNEE ARTHROSCOPY WITH DEBRIDEMENT;  Surgeon: Erin Sons, MD;  Location[facility];  Service: Orthopedics;  Laterality: Right;  Diabetic - insulin and oral meds  . ROTATOR CUFF REPAIR Left 2006  . TUBAL LIGATION  2000's   Family History  Problem Relation Age of Onset  . Hypertension Mother   . Hypertension Father   . Diabetes Father   . Allergies Father   . Kidney disease Father   . Breast cancer Neg Hx    Social History   Tobacco Use  . Smoking status: Never Smoker  . Smokeless tobacco: Never Used  Substance Use Topics  . Alcohol use: No   Allergies  Allergen Reactions  . Penicillins Anaphylaxis    Has patient had a PCN reaction causing immediate rash, facial/tongue/throat swelling, SOB or lightheadedness with hypotension: Yes  Has patient had a PCN reaction causing severe rash involving mucus membranes or skin necrosis: No Has patient had a PCN reaction that required hospitalization No Has patient had a PCN reaction occurring within the last 10 years: No If all of the above answers are "NO", then may proceed with Cephalosporin use.   . Ace Inhibitors Swelling     Review of Systems  Constitutional: Positive for fatigue (with minimal exertion). Negative for appetite change.  HENT: Negative for congestion and postnasal drip.   Eyes: Negative.    Respiratory: Positive for shortness of breath (easily). Negative for cough and chest tightness.   Cardiovascular: Positive for chest pain (pressure), palpitations and leg swelling (better).  Gastrointestinal: Positive for abdominal distention (better). Negative for abdominal pain.  Endocrine: Negative.   Genitourinary: Negative.   Musculoskeletal: Positive for arthralgias (feet hurting). Negative for back pain.  Skin: Negative.   Allergic/Immunologic: Negative.   Neurological: Positive for light-headedness. Negative for dizziness.  Hematological: Negative for adenopathy. Does not bruise/bleed easily.  Psychiatric/Behavioral: Positive for sleep disturbance. Negative for dysphoric mood. The patient is not nervous/anxious.      Physical Exam Vitals signs and nursing note reviewed.  Constitutional:      Appearance: She is well-developed.  HENT:     Head: Normocephalic and atraumatic.  Neck:     Musculoskeletal: Normal range of motion and neck supple.     Thyroid: No thyromegaly.  Cardiovascular:     Rate and Rhythm: Normal rate and regular rhythm.  Pulmonary:     Effort: Pulmonary effort is normal. No respiratory distress.     Breath sounds: No wheezing or rales.  Abdominal:     General: There is distension.     Palpations: Abdomen is soft.  Musculoskeletal:     Right lower leg: She exhibits no tenderness. No edema.     Left lower leg: She exhibits no tenderness. No edema.  Skin:    General: Skin is warm and dry.  Neurological:     Mental Status: She is alert and oriented to person, place, and time.  Psychiatric:        Behavior: Behavior normal.    Assessment & Plan:  1: Acute on Chronic heart failure with preserved ejection fraction- - NYHA class III - fluid status improving although continues to have abdominal distention - weighing daily and she was reminded & it was emphasized to call for an overnight weight gain of >2 pounds or a weekly weight gain of >5 pounds -  weight   - follows with cardiology (Khan)  - not adding salt and it was reviewed about keeping daily sodium intake to <2000mg daily - reviewed fluid intake and keeping daily intake to 60-64 ounces. Patient says that she probably drinks twice that amount because of her dry mouth. Encouraged her to try sucking on sugar free candy instead of drinking so much fluid. - BNP 07/17/18 was 39.0   2: HTN- - BP  - saw PCP (Aycock) 04/24/18 - BMP 10/12/2018 reviewed and showed sodium 136, potassium 3.9, creatinine 1.3 and GFR 54  3: DM- - A1c 06/29/18 was 8.2% - saw endocrinology (Blackwood) 06/29/18  4: Obstructive sleep apnea- - saw pulmonolgist (Ramachandran) 04/14/18 - had sleep study scheduled for 07/09/18  5: Lymphedema- - stage 2 - limited in exercise due to symptoms  - unable to get compression socks on - has been wearing compression boots daily except for last night  Patient did not bring her medications nor a list.   Each medication was verbally reviewed with the patient and she was encouraged to bring the bottles to every visit to confirm accuracy of list.

## 2018-10-29 ENCOUNTER — Ambulatory Visit: Payer: 59 | Admitting: Family

## 2018-10-30 ENCOUNTER — Ambulatory Visit (INDEPENDENT_AMBULATORY_CARE_PROVIDER_SITE_OTHER): Payer: 59 | Admitting: Gastroenterology

## 2018-10-30 ENCOUNTER — Other Ambulatory Visit: Payer: Self-pay

## 2018-10-30 ENCOUNTER — Encounter: Payer: Self-pay | Admitting: Gastroenterology

## 2018-10-30 VITALS — BP 138/84 | HR 132 | Ht 65.0 in | Wt 209.6 lb

## 2018-10-30 DIAGNOSIS — K625 Hemorrhage of anus and rectum: Secondary | ICD-10-CM

## 2018-10-30 DIAGNOSIS — K641 Second degree hemorrhoids: Secondary | ICD-10-CM

## 2018-10-30 NOTE — Progress Notes (Signed)
PROCEDURE NOTE: The patient presents with symptomatic grade 2 hemorrhoids, unresponsive to maximal medical therapy, requesting rubber band ligation of his/her hemorrhoidal disease.  All risks, benefits and alternative forms of therapy were described and informed consent was obtained.   The decision was made to band the RP internal hemorrhoid, and the [facility] System was used to perform band ligation without complication.  Digital anorectal examination was then performed to assure proper positioning of the band, and to adjust the banded tissue as required.  The patient was discharged home without significant pain or other issues.  Dietary and behavioral recommendations were given and (if necessary - prescriptions were given), along with follow-up instructions.  The patient will return 2 weeks for follow-up and possible additional banding as required.  No complications were encountered and the patient tolerated the procedure well.

## 2018-10-30 NOTE — Progress Notes (Signed)
Arlyss Repress, MD [address]  [address]  [place], [place] [address]  Main: [phone]  Fax: [phone]    Gastroenterology Consultation  Referring Provider:     Emogene Morgan, MD Primary Care Physician:  Emogene Morgan, MD Primary Gastroenterologist:  Dr. Mechele Collin Reason for Consultation:     Rectal bleeding        HPI:   Amanda [name] is a 53 y.o. female referred by Dr. Emogene Morgan, MD  for consultation & management of rectal bleeding and rectal pain.  Patient was admitted to [facility] in early February secondary to shortness of breath as well as rectal bleeding.  GI was consulted at that time due to rectal bleeding.  Her hemoglobin was within normal limits. She was evaluated by cardiology, she is closely followed by Dr. [name] for sinus tachycardia.  Cardiac work-up was negative.  She underwent colonoscopy prior to discharge, found to have large internal and external hemorrhoids.  She got readmitted within a week secondary to hypotension, AKI and hyperglycemia which was thought to be secondary to use of beta-blockers.  Currently, she is taking Cardizem and followed by Dr. [name].  She had history of H. pylori gastritis, treated with quadruple therapy in 2018  Interval summary She denies much rectal bleeding but continues to have rectal discomfort.  She is here to discuss about hemorrhoid ligation.  She denies constipation, any other GI symptoms  NSAIDs: None  Antiplts/Anticoagulants/Anti thrombotics: None  GI Procedures: Colonoscopy 10/06/2018 - Preparation of the colon was fair. - Hemorrhoids found on perianal exam. - Stool in the entire examined colon. - Non-bleeding external and internal hemorrhoids, source of rectal bleeding - No specimens collected.  Colonoscopy by Dr. Mechele Collin in 03/2017 One diminutive polyp in the sigmoid colon, removed with a jumbo cold forceps. Resected and retrieved. - Internal hemorrhoids. - The examination was otherwise normal.  EGD by  Dr. Mechele Collin 03/2017 - Normal esophagus. - Gastritis. Biopsied. - Normal examined duodenum.  DIAGNOSIS:  A. STOMACH, ANTRUM; COLD BIOPSY:  - MODERATE CHRONIC ACTIVE GASTRITIS, HELICO[employer]CTER [employer]YLORI [employer].  - NEGATIVE FOR DYSPLASIA AND MALIGNANCY.   B. COLON POLYP, SIGMOID; COLD BIOPSY:  - HYPERPLASTIC EPITHELIAL CHANGE.  - NEGATIVE FOR DYSPLASIA AND MALIGNANCY.   Past Medical History:  Diagnosis Date  . Arthritis    knees, shoulder, upper back  . Asthma   . CHF (congestive heart failure) (HCC)   . Diabetes mellitus without complication (HCC)   . Environmental allergies   . Fibromyalgia   . GERD (gastroesophageal reflux disease)   . Headache    migraines - 5x/mo  . Hypertension   . Hypokalemia 10/14/2017  . Migraines   . Motion sickness    ships  . Sleep apnea   . Stroke [facility])    no residual deficits  . Thyroid nodule    bilateral and goiter  . Wears contact lenses   . Wears dentures    partial upper    Past Surgical History:  Procedure Laterality Date  . ABDOMINAL HYSTERECTOMY  2000's  . ABDOMINAL SURGERY     laparoscopy x4 with lysis of adhesions  . APPENDECTOMY  1986  . CESAREAN SECTION  1992  . CHOLECYSTECTOMY N/A 04/03/2017   Procedure: LAPAROSCOPIC CHOLECYSTECTOMY;  Surgeon: Henrene Dodge, MD;  Location: [facility] ORS;  Service: General;  Laterality: N/A;  . COLONOSCOPY N/A 10/06/2018   Procedure: COLONOSCOPY;  Surgeon: Toney Reil, MD;  Location[facility] ENDOSCOPY;  Service: Gastroenterology;  Laterality: N/A;  .  COLONOSCOPY WITH PROPOFOL N/A 03/10/2017   Procedure: COLONOSCOPY WITH PROPOFOL;  Surgeon: Scot JunElliott, Robert T, MD;  Location[facility] ENDOSCOPY;  Service: Endoscopy;  Laterality: N/A;  . ECTOPIC PREGNANCY SURGERY  2000's  . ESOPHAGOGASTRODUODENOSCOPY (EGD) WITH PROPOFOL N/A 03/10/2017   Procedure: ESOPHAGOGASTRODUODENOSCOPY (EGD) WITH PROPOFOL;  Surgeon: Scot JunElliott, Robert T, MD;  [facility];  Service: Endoscopy;  Laterality: N/A;  . HERNIA  REPAIR    . JOINT REPLACEMENT Right 12/16/12   knee- medial - makoplasty  . KNEE ARTHROSCOPY Right 2006   partial medial and lateral meniscectomies  . KNEE ARTHROSCOPY Right 10/06/2015   Procedure: RIGHT KNEE ARTHROSCOPY WITH DEBRIDEMENT;  Surgeon: Erin SonsHarold Kernodle, MD;  [facility];  Service: Orthopedics;  Laterality: Right;  Diabetic - insulin and oral meds  . ROTATOR CUFF REPAIR Left 2006  . TUBAL LIGATION  2000's    Current Outpatient Medications:  .  albuterol (PROVENTIL HFA) 108 (90 Base) MCG/ACT inhaler, Inhale 2 puffs into the lungs every 4 (four) hours as needed for wheezing or shortness of breath., Disp: 3 Inhaler, Rfl: 1 .  amLODipine (NORVASC) 5 MG tablet, Take 1 tablet (5 mg total) by mouth daily., Disp: 30 tablet, Rfl: 0 .  ARIPiprazole (ABILIFY) 2 MG tablet, Take 2 mg by mouth daily. , Disp: , Rfl:  .  Ascorbic Acid (VITAMIN C) 1000 MG tablet, Take 500 mg by mouth 2 (two) times daily. , Disp: , Rfl:  .  BREO ELLIPTA 200-25 MCG/INH AEPB, Inhale 1 puff into the lungs daily. (Patient taking differently: Inhale 1 puff into the lungs daily. ), Disp: 60 each, Rfl: 4 .  canagliflozin (INVOKANA) 300 MG TABS tablet, Take 300 mg by mouth daily before breakfast., Disp: , Rfl:  .  clonazePAM (KLONOPIN) 0.5 MG tablet, Take 0.5 mg by mouth 2 (two) times daily., Disp: , Rfl:  .  DULoxetine (CYMBALTA) 60 MG capsule, Take 60 mg by mouth 2 (two) times daily., Disp: , Rfl:  .  flunisolide (NASALIDE) 25 MCG/ACT (0.025%) SOLN, Place 2 sprays into the nose daily as needed., Disp: , Rfl:  .  furosemide (LASIX) 40 MG tablet, , Disp: , Rfl:  .  gabapentin (NEURONTIN) 300 MG capsule, Take 600 mg by mouth 2 (two) times daily., Disp: , Rfl:  .  hydrocortisone (ANUSOL-HC) 25 MG suppository, Place 1 suppository (25 mg total) rectally 2 (two) times daily., Disp: 30 suppository, Rfl: 0 .  insulin detemir (LEVEMIR) 100 UNIT/ML injection, Inject 45 Units into the skin daily. , Disp: , Rfl:  .   ipratropium-albuterol (DUONEB) 0.5-2.5 (3) MG/3ML SOLN, Inhale contents of 1 vial via nebulizer 3 times daily as needed for shortness of breath/wheeze/cough. (Patient taking differently: Inhale 3 mLs into the lungs 3 (three) times daily as needed (shortness of breath, wheezing or cough). ), Disp: 360 mL, Rfl: 3 .  isosorbide mononitrate (IMDUR) 30 MG 24 hr tablet, Take 1 tablet (30 mg total) by mouth daily., Disp: , Rfl:  .  LINZESS 290 MCG CAPS capsule, Take 1 tablet by mouth daily before breakfast. , Disp: , Rfl:  .  losartan (COZAAR) 100 MG tablet, Take 100 mg by mouth daily., Disp: , Rfl:  .  mirtazapine (REMERON SOL-TAB) 30 MG disintegrating tablet, Take 30 mg by mouth daily. , Disp: , Rfl: 2 .  Multiple Vitamin (MULTIVITAMIN) tablet, Take 1 tablet by mouth daily., Disp: , Rfl:  .  omeprazole (PRILOSEC) 40 MG capsule, Take 1 capsule (40 mg total) by mouth daily. 1  hr before supper, Disp: 30 capsule, Rfl: 0 .  Potassium Chloride ER 20 MEQ TBCR, Take 20 mEq by mouth daily. Take with lasix, Disp: , Rfl:  .  pravastatin (PRAVACHOL) 80 MG tablet, Take 1 tablet (80 mg total) by mouth daily., Disp: 30 tablet, Rfl: 0 .  pregabalin (LYRICA) 150 MG capsule, Take 150 mg by mouth 3 (three) times daily., Disp: , Rfl:  .  senna-docusate (SENOKOT-S) 8.6-50 MG tablet, Take 1 tablet by mouth 2 (two) times daily., Disp: 30 tablet, Rfl: 0 .  sitaGLIPtin (JANUVIA) 100 MG tablet, Take 100 mg by mouth daily., Disp: , Rfl:  .  sucralfate (CARAFATE) 1 g tablet, Take 1 g by mouth 2 (two) times daily. , Disp: , Rfl:  .  SUMAtriptan (IMITREX) 25 MG tablet, Take 25 mg by mouth every 2 (two) hours as needed for migraine. May repeat in 2 hours if headache persists or recurs., Disp: , Rfl:  .  tiotropium (SPIRIVA HANDIHALER) 18 MCG inhalation capsule, Place 1 capsule (18 mcg total) into inhaler and inhale daily., Disp: 90 capsule, Rfl: 2 .  tiZANidine (ZANAFLEX) 4 MG tablet, Take 4 mg by mouth every 8 (eight) hours as needed  for muscle spasms. , Disp: , Rfl:  .  zolpidem (AMBIEN) 10 MG tablet, Take 10 mg by mouth at bedtime as needed for sleep. , Disp: , Rfl:  .  montelukast (SINGULAIR) 10 MG tablet, Take 1 tablet (10 mg total) by mouth daily., Disp: 30 tablet, Rfl: 1   Family History  Problem Relation Age of Onset  . Hypertension Mother   . Hypertension Father   . Diabetes Father   . Allergies Father   . Kidney disease Father   . Breast cancer Neg Hx      Social History   Tobacco Use  . Smoking status: Never Smoker  . Smokeless tobacco: Never Used  Substance Use Topics  . Alcohol use: No  . Drug use: No    Allergies as of 10/30/2018 - Review Complete 10/30/2018  Allergen Reaction Noted  . Penicillins Anaphylaxis 05/28/2015  . Ace inhibitors Swelling 05/28/2015    Review of Systems:    All systems reviewed and negative except where noted in HPI.   Physical Exam:  BP 138/84   Pulse (!) 132   Ht 5\' 5"  (1.651 m)   Wt 209 lb 9.6 oz (95.1 kg)   BMI 34.88 kg/m  No LMP recorded. Patient has had a hysterectomy.  General:   Alert,  Well-developed, well-nourished, pleasant and cooperative in NAD Head:  Normocephalic and atraumatic. Eyes:  Sclera clear, no icterus.   Conjunctiva pink. Ears:  Normal auditory acuity. Nose:  No deformity, discharge, or lesions. Mouth:  No deformity or lesions,oropharynx pink & moist. Neck:  Supple; no masses or thyromegaly. Lungs:  Respirations even and unlabored.  Clear throughout to auscultation.   No wheezes, crackles, or rhonchi. No acute distress. Heart:  Regular rate and rhythm; no murmurs, clicks, rubs, or gallops. Abdomen:  Normal bowel sounds. Soft, non-tender and non-distended without masses, hepatosplenomegaly or hernias noted.  No guarding or rebound tenderness.   Rectal: Not performed Msk:  Symmetrical without gross deformities. Good, equal movement & strength bilaterally. Pulses:  Normal pulses noted. Extremities:  No clubbing or edema.  No  cyanosis. Neurologic:  Alert and oriented x3;  grossly normal neurologically. Skin:  Intact without significant lesions or rashes. No jaundice. Psych:  Alert and cooperative. Normal mood and affect.  Imaging Studies: Reviewed  Assessment and Plan:   Amanda [name] is a 53 y.o. African-American female with metabolic syndrome seen as a hospital follow-up of rectal bleeding.  She had a colonoscopy which revealed large internal/external hemorrhoids only.  She is here to discuss about hemorrhoid ligation.  I discussed with her about outpatient hemorrhoid ligation, risks and benefits.  Patient is agreeable, consent obtained, perform hemorrhoid ligation today   Follow up in 2 weeks   Arlyss Repress, MD

## 2018-11-02 ENCOUNTER — Ambulatory Visit: Payer: 59 | Admitting: Gastroenterology

## 2018-11-03 NOTE — Progress Notes (Deleted)
Patient ID: Amanda [name], female    DOB: [birth year], 53 y.o.   MRN: [id number]  HPI  [name] is a 53 y/o female with a history of asthma, DM, HTN, stroke, thyroid disease, GERD, fibromyalgia, migraines, obstructive sleep apnea and chronic heart failure.   Echo done 10/04/2018 but no EF to report. Echo report from 03/29/18 reviewed and showed an EF of 55-60%.  Admitted 10/10/2018 due to hypotension and dehydration. Blood pressure medications were held and IV fluids were given. Discharged after 3 days. Admitted 10/02/2018 due to GIB. Cardiology and GI consults were obtained. Colonoscopy done which showed external/ internal hemorrhoids. Given IV fluids. CT scan chest angiogram is negative. Discharged after 4 days.   She presents today for a follow-up visit with a chief complaint of   Past Medical History:  Diagnosis Date  . Arthritis    knees, shoulder, upper back  . Asthma   . CHF (congestive heart failure) (HCC)   . Diabetes mellitus without complication (HCC)   . Environmental allergies   . Fibromyalgia   . GERD (gastroesophageal reflux disease)   . Headache    migraines - 5x/mo  . Hypertension   . Hypokalemia 10/14/2017  . Migraines   . Motion sickness    ships  . Sleep apnea   . Stroke [facility])    no residual deficits  . Thyroid nodule    bilateral and goiter  . Wears contact lenses   . Wears dentures    partial upper   Past Surgical History:  Procedure Laterality Date  . ABDOMINAL HYSTERECTOMY  2000's  . ABDOMINAL SURGERY     laparoscopy x4 with lysis of adhesions  . APPENDECTOMY  1986  . CESAREAN SECTION  1992  . CHOLECYSTECTOMY N/A 04/03/2017   Procedure: LAPAROSCOPIC CHOLECYSTECTOMY;  Surgeon: Henrene Dodge, MD;  Location: [facility] ORS;  Service: General;  Laterality: N/A;  . COLONOSCOPY N/A 10/06/2018   Procedure: COLONOSCOPY;  Surgeon: Toney Reil, MD;  Location: [facility] ENDOSCOPY;  Service: Gastroenterology;  Laterality: N/A;  . COLONOSCOPY WITH PROPOFOL N/A  03/10/2017   Procedure: COLONOSCOPY WITH PROPOFOL;  Surgeon: Scot Jun, MD;  Location: [facility] ENDOSCOPY;  Service: Endoscopy;  Laterality: N/A;  . ECTOPIC PREGNANCY SURGERY  2000's  . ESOPHAGOGASTRODUODENOSCOPY (EGD) WITH PROPOFOL N/A 03/10/2017   Procedure: ESOPHAGOGASTRODUODENOSCOPY (EGD) WITH PROPOFOL;  Surgeon: Scot Jun, MD;  Location: [facility] ENDOSCOPY;  Service: Endoscopy;  Laterality: N/A;  . HERNIA REPAIR    . JOINT REPLACEMENT Right 12/16/12   knee- medial - makoplasty  . KNEE ARTHROSCOPY Right 2006   partial medial and lateral meniscectomies  . KNEE ARTHROSCOPY Right 10/06/2015   Procedure: RIGHT KNEE ARTHROSCOPY WITH DEBRIDEMENT;  Surgeon: Erin Sons, MD;  [facility];  Service: Orthopedics;  Laterality: Right;  Diabetic - insulin and oral meds  . ROTATOR CUFF REPAIR Left 2006  . TUBAL LIGATION  2000's   Family History  Problem Relation Age of Onset  . Hypertension Mother   . Hypertension Father   . Diabetes Father   . Allergies Father   . Kidney disease Father   . Breast cancer Neg Hx    Social History   Tobacco Use  . Smoking status: Never Smoker  . Smokeless tobacco: Never Used  Substance Use Topics  . Alcohol use: No   Allergies  Allergen Reactions  . Penicillins Anaphylaxis    Has patient had a PCN reaction causing immediate rash, facial/tongue/throat swelling, SOB or lightheadedness with hypotension: Yes  Has patient had a PCN reaction causing severe rash involving mucus membranes or skin necrosis: No Has patient had a PCN reaction that required hospitalization No Has patient had a PCN reaction occurring within the last 10 years: No If all of the above answers are "NO", then may proceed with Cephalosporin use.   . Ace Inhibitors Swelling     Review of Systems  Constitutional: Positive for fatigue (with minimal exertion). Negative for appetite change.  HENT: Negative for congestion and postnasal drip.   Eyes: Negative.    Respiratory: Positive for shortness of breath (easily). Negative for cough and chest tightness.   Cardiovascular: Positive for chest pain (pressure), palpitations and leg swelling (better).  Gastrointestinal: Positive for abdominal distention (better). Negative for abdominal pain.  Endocrine: Negative.   Genitourinary: Negative.   Musculoskeletal: Positive for arthralgias (feet hurting). Negative for back pain.  Skin: Negative.   Allergic/Immunologic: Negative.   Neurological: Positive for light-headedness. Negative for dizziness.  Hematological: Negative for adenopathy. Does not bruise/bleed easily.  Psychiatric/Behavioral: Positive for sleep disturbance. Negative for dysphoric mood. The patient is not nervous/anxious.      Physical Exam Vitals signs and nursing note reviewed.  Constitutional:      Appearance: She is well-developed.  HENT:     Head: Normocephalic and atraumatic.  Neck:     Musculoskeletal: Normal range of motion and neck supple.     Thyroid: No thyromegaly.  Cardiovascular:     Rate and Rhythm: Normal rate and regular rhythm.  Pulmonary:     Effort: Pulmonary effort is normal. No respiratory distress.     Breath sounds: No wheezing or rales.  Abdominal:     General: There is distension.     Palpations: Abdomen is soft.  Musculoskeletal:     Right lower leg: She exhibits no tenderness. No edema.     Left lower leg: She exhibits no tenderness. No edema.  Skin:    General: Skin is warm and dry.  Neurological:     Mental Status: She is alert and oriented to person, place, and time.  Psychiatric:        Behavior: Behavior normal.    Assessment & Plan:  1: Acute on Chronic heart failure with preserved ejection fraction- - NYHA class III - fluid status improving although continues to have abdominal distention - weighing daily and she was reminded & it was emphasized to call for an overnight weight gain of >2 pounds or a weekly weight gain of >5 pounds -  weight   - follows with cardiology [name])  - not adding salt and it was reviewed about keeping daily sodium intake to 2000mg  daily - reviewed fluid intake and keeping daily intake to 60-64 ounces. Patient says that she probably drinks twice that amount because of her dry mouth. Encouraged her to try sucking on sugar free candy instead of drinking so much fluid. - BNP 07/17/18 was 39.0   2: HTN- - BP  - saw PCP (Aycock) 04/24/18 - BMP 10/12/2018 reviewed and showed sodium 136, potassium 3.9, creatinine 1.3 and GFR 54  3: DM- - A1c 06/29/18 was 8.2% - saw endocrinology Rosaura Carpenter) 06/29/18  4: Obstructive sleep apnea- - saw pulmonolgist Nicholos Johns) 04/14/18 - had sleep study scheduled for 07/09/18  5: Lymphedema- - stage 2 - limited in exercise due to symptoms  - unable to get compression socks on - has been wearing compression boots daily except for last night  Patient did not bring her medications nor a list.  Each medication was verbally reviewed with the patient and she was encouraged to bring the bottles to every visit to confirm accuracy of list.

## 2018-11-05 ENCOUNTER — Ambulatory Visit: Payer: 59 | Admitting: Family

## 2018-11-06 ENCOUNTER — Telehealth: Payer: Self-pay | Admitting: Family

## 2018-11-06 ENCOUNTER — Ambulatory Visit: Payer: 59 | Admitting: Family

## 2018-11-06 NOTE — Telephone Encounter (Signed)
Patient did not show for her [facility] appointment on 11/06/2018. Will attempt to reschedule.  

## 2018-11-11 ENCOUNTER — Emergency Department
Admission: EM | Admit: 2018-11-11 | Discharge: 2018-11-11 | Disposition: A | Discharge status: Home / Self Care | Payer: 59 | Attending: Emergency Medicine | Admitting: Emergency Medicine

## 2018-11-11 DIAGNOSIS — N3000 Acute cystitis without hematuria: Secondary | ICD-10-CM | POA: Diagnosis not present

## 2018-11-11 DIAGNOSIS — E119 Type 2 diabetes mellitus without complications: Secondary | ICD-10-CM | POA: Diagnosis not present

## 2018-11-11 DIAGNOSIS — Z79899 Other long term (current) drug therapy: Secondary | ICD-10-CM | POA: Insufficient documentation

## 2018-11-11 DIAGNOSIS — R531 Weakness: Secondary | ICD-10-CM | POA: Diagnosis present

## 2018-11-11 DIAGNOSIS — I509 Heart failure, unspecified: Secondary | ICD-10-CM | POA: Insufficient documentation

## 2018-11-11 DIAGNOSIS — E860 Dehydration: Secondary | ICD-10-CM | POA: Diagnosis not present

## 2018-11-11 DIAGNOSIS — Z794 Long term (current) use of insulin: Secondary | ICD-10-CM | POA: Insufficient documentation

## 2018-11-11 DIAGNOSIS — I110 Hypertensive heart disease with heart failure: Secondary | ICD-10-CM | POA: Diagnosis not present

## 2018-11-11 LAB — CBC WITH DIFFERENTIAL/PLATELET
Abs Immature Granulocytes: 0.01 10*3/uL (ref 0.00–0.07)
Basophils Absolute: 0 10*3/uL (ref 0.0–0.1)
Basophils Relative: 1 %
Eosinophils Absolute: 0 10*3/uL (ref 0.0–0.5)
Eosinophils Relative: 1 %
HCT: 41.1 % (ref 36.0–46.0)
Hemoglobin: 13.1 g/dL (ref 12.0–15.0)
Immature Granulocytes: 0 %
Lymphocytes Relative: 38 %
Lymphs Abs: 2 10*3/uL (ref 0.7–4.0)
MCH: 29.6 pg (ref 26.0–34.0)
MCHC: 31.9 g/dL (ref 30.0–36.0)
MCV: 92.8 fL (ref 80.0–100.0)
Monocytes Absolute: 0.5 10*3/uL (ref 0.1–1.0)
Monocytes Relative: 9 %
Neutro Abs: 2.7 10*3/uL (ref 1.7–7.7)
Neutrophils Relative %: 51 %
Platelets: 276 10*3/uL (ref 150–400)
RBC: 4.43 MIL/uL (ref 3.87–5.11)
RDW: 13.8 % (ref 11.5–15.5)
WBC: 5.2 10*3/uL (ref 4.0–10.5)
nRBC: 0 % (ref 0.0–0.2)

## 2018-11-11 LAB — TROPONIN I: Troponin I: <0.03 ng/mL (ref <0.03)

## 2018-11-11 LAB — URINALYSIS, COMPLETE (UACMP) WITH MICROSCOPIC
Bilirubin Urine: NEGATIVE
Glucose, UA: >=500 mg/dL — AB
Hgb urine dipstick: NEGATIVE
Ketones, ur: NEGATIVE mg/dL
Nitrite: NEGATIVE
Protein, ur: NEGATIVE mg/dL
Specific Gravity, Urine: 1.017 (ref 1.005–1.030)
pH: 5 (ref 5.0–8.0)

## 2018-11-11 LAB — GLUCOSE, CAPILLARY: Glucose-Capillary: 259 mg/dL — ABNORMAL HIGH (ref 70–99)

## 2018-11-11 LAB — COMPREHENSIVE METABOLIC PANEL
ALT: 35 U/L (ref 0–44)
AST: 33 U/L (ref 15–41)
Albumin: 4.3 g/dL (ref 3.5–5.0)
Alkaline Phosphatase: 85 U/L (ref 38–126)
Anion gap: 9 (ref 5–15)
BUN: 21 mg/dL — ABNORMAL HIGH (ref 6–20)
CO2: 25 mmol/L (ref 22–32)
Calcium: 9.2 mg/dL (ref 8.9–10.3)
Chloride: 103 mmol/L (ref 98–111)
Creatinine, Ser: 1.08 mg/dL — ABNORMAL HIGH (ref 0.44–1.00)
GFR calc Af Amer: >60 mL/min (ref >60)
GFR calc non Af Amer: 59 mL/min — ABNORMAL LOW (ref >60)
Glucose, Bld: 194 mg/dL — ABNORMAL HIGH (ref 70–99)
Potassium: 4.2 mmol/L (ref 3.5–5.1)
Sodium: 137 mmol/L (ref 135–145)
Total Bilirubin: 0.6 mg/dL (ref 0.3–1.2)
Total Protein: 7.5 g/dL (ref 6.5–8.1)

## 2018-11-11 MED ORDER — CEPHALEXIN 500 MG PO CAPS: 500.0000 mg | ORAL_CAPSULE | Freq: Three times a day (TID) | ORAL | 0 refills | Status: AC

## 2018-11-11 MED ORDER — CEPHALEXIN 500 MG PO CAPS
500.0000 mg | ORAL_CAPSULE | Freq: Once | ORAL | Status: AC
  Administered 2018-11-11: 500 mg via ORAL
  Filled 2018-11-11: qty 1

## 2018-11-11 MED ORDER — SODIUM CHLORIDE 0.9 % IV BOLUS
1000.0000 mL | Freq: Once | INTRAVENOUS | Status: AC
  Administered 2018-11-11: 1000 mL via INTRAVENOUS

## 2018-11-11 NOTE — ED Notes (Signed)
.   Pt is resting, Respirations even and unlabored, NAD. Stretcher lowest postion and locked. Call bell within reach. Denies any needs at this time RN will continue to monitor.    

## 2018-11-11 NOTE — ED Notes (Signed)
[id number] 

## 2018-11-11 NOTE — ED Triage Notes (Addendum)
Pt arrives per [facility] hypotension and hypergylcemia. Pt is AMS.

## 2018-11-11 NOTE — ED Provider Notes (Signed)
[facility] Emergency Department Provider Note  ____________________________________________  Time seen: Approximately 5:42 PM  I have reviewed the triage vital signs and the nursing notes.   HISTORY  Chief Complaint Hypotension   HPI Amanda [name] is a 53 y.o. female with a history of hypertension, fibromyalgia, GI bleed, CHF, diabetes who presents for evaluation of generalized weakness.  Patient reports that her symptoms have been ongoing for 2 days, progressively worse.  Today she felt so weak that she could barely walk.  When her home health nurse came to see her she noted that her blood pressure was low with systolics in the 90s which prompted her to call 911.  She denies nausea, vomiting, diarrhea, fever, HA, cough, chest pain, shortness of breath, back pain, dysuria.  She reports that she is always thirsty.  She reports that her sugars have been elevated to the 400s at home.  She reports similar presentation in 10/2018 when she had AKI from dehydration.   Past Medical History:  Diagnosis Date  . Arthritis    knees, shoulder, upper back  . Asthma   . CHF (congestive heart failure) (HCC)   . Diabetes mellitus without complication (HCC)   . Environmental allergies   . Fibromyalgia   . GERD (gastroesophageal reflux disease)   . Headache    migraines - 5x/mo  . Hypertension   . Hypokalemia 10/14/2017  . Migraines   . Motion sickness    ships  . Sleep apnea   . Stroke [facility])    no residual deficits  . Thyroid nodule    bilateral and goiter  . Wears contact lenses   . Wears dentures    partial upper    Patient Active Problem List   Diagnosis Date Noted  . Hypotension 10/10/2018  . GI bleed 10/02/2018  . Acute on chronic diastolic (congestive) heart failure (HCC) 07/17/2018  . Acute on chronic heart failure (HCC) 07/08/2018  . Chronic diastolic heart failure (HCC) 04/17/2018  . Lymphedema 04/17/2018  . Chest pain 03/28/2018  .  Hypertension 10/15/2017  . Wears dentures 10/15/2017  . Symptomatic cholelithiasis 04/02/2017  . Recurrent incisional hernia 03/17/2017  . Calculus of gallbladder without cholecystitis without obstruction 03/17/2017  . Helicobacter pylori gastritis 03/17/2017  . Depression 09/28/2016  . Diabetes mellitus, type 2 (HCC) 09/28/2016  . Tremor 07/01/2013  . Esophageal reflux 06/22/2012  . Major depressive disorder, single episode 08/22/2009  . Sleep apnea 05/30/2009  . Hyperlipidemia 08/10/2008    Past Surgical History:  Procedure Laterality Date  . ABDOMINAL HYSTERECTOMY  2000's  . ABDOMINAL SURGERY     laparoscopy x4 with lysis of adhesions  . APPENDECTOMY  1986  . CESAREAN SECTION  1992  . CHOLECYSTECTOMY N/A 04/03/2017   Procedure: LAPAROSCOPIC CHOLECYSTECTOMY;  Surgeon: Henrene Dodge, MD;  Location: [facility] ORS;  Service: General;  Laterality: N/A;  . COLONOSCOPY N/A 10/06/2018   Procedure: COLONOSCOPY;  Surgeon: Toney Reil, MD;  Location: [facility] ENDOSCOPY;  Service: Gastroenterology;  Laterality: N/A;  . COLONOSCOPY WITH PROPOFOL N/A 03/10/2017   Procedure: COLONOSCOPY WITH PROPOFOL;  Surgeon: Scot Jun, MD;  Location: [facility] ENDOSCOPY;  Service: Endoscopy;  Laterality: N/A;  . ECTOPIC PREGNANCY SURGERY  2000's  . ESOPHAGOGASTRODUODENOSCOPY (EGD) WITH PROPOFOL N/A 03/10/2017   Procedure: ESOPHAGOGASTRODUODENOSCOPY (EGD) WITH PROPOFOL;  Surgeon: Scot Jun, MD;  Location: [facility] ENDOSCOPY;  Service: Endoscopy;  Laterality: N/A;  . HERNIA REPAIR    . JOINT REPLACEMENT Right 12/16/12   knee- medial -  makoplasty  . KNEE ARTHROSCOPY Right 2006   partial medial and lateral meniscectomies  . KNEE ARTHROSCOPY Right 10/06/2015   Procedure: RIGHT KNEE ARTHROSCOPY WITH DEBRIDEMENT;  Surgeon: Erin Sons, MD;  Location: [facility];  Service: Orthopedics;  Laterality: Right;  Diabetic - insulin and oral meds  . ROTATOR CUFF REPAIR Left 2006  . TUBAL LIGATION  2000's     Prior to Admission medications   Medication Sig Start Date End Date Taking? Authorizing Provider  albuterol (PROVENTIL HFA) 108 (90 Base) MCG/ACT inhaler Inhale 2 puffs into the lungs every 4 (four) hours as needed for wheezing or shortness of breath. 07/21/18   Shane Crutch, MD  amLODipine (NORVASC) 5 MG tablet Take 1 tablet (5 mg total) by mouth daily. 10/06/18   Altamese Dilling, MD  ARIPiprazole (ABILIFY) 2 MG tablet Take 2 mg by mouth daily.     [provider]  Ascorbic Acid (VITAMIN C) 1000 MG tablet Take 500 mg by mouth 2 (two) times daily.     [provider]  BREO ELLIPTA 200-25 MCG/INH AEPB Inhale 1 puff into the lungs daily. Patient taking differently: Inhale 1 puff into the lungs daily.  04/23/18   Shane Crutch, MD  canagliflozin (INVOKANA) 300 MG TABS tablet Take 300 mg by mouth daily before breakfast.    [provider]  cephALEXin (KEFLEX) 500 MG capsule Take 1 capsule (500 mg total) by mouth 3 (three) times daily for 7 days. 11/11/18 11/18/18  Nita Sickle, MD  clonazePAM (KLONOPIN) 0.5 MG tablet Take 0.5 mg by mouth 2 (two) times daily.    [provider]  DULoxetine (CYMBALTA) 60 MG capsule Take 60 mg by mouth 2 (two) times daily.    [provider]  flunisolide (NASALIDE) 25 MCG/ACT (0.025%) SOLN Place 2 sprays into the nose daily as needed.    [provider]  furosemide (LASIX) 40 MG tablet  10/16/18   [provider]  gabapentin (NEURONTIN) 300 MG capsule Take 600 mg by mouth 2 (two) times daily. 03/22/18   [provider]  hydrocortisone (ANUSOL-HC) 25 MG suppository Place 1 suppository (25 mg total) rectally 2 (two) times daily. 10/06/18   Altamese Dilling, MD  insulin detemir (LEVEMIR) 100 UNIT/ML injection Inject 45 Units into the skin daily.     [provider]  ipratropium-albuterol (DUONEB) 0.5-2.5 (3) MG/3ML SOLN Inhale contents of 1 vial via nebulizer 3 times  daily as needed for shortness of breath/wheeze/cough. Patient taking differently: Inhale 3 mLs into the lungs 3 (three) times daily as needed (shortness of breath, wheezing or cough).  11/21/17   Shane Crutch, MD  isosorbide mononitrate (IMDUR) 30 MG 24 hr tablet Take 1 tablet (30 mg total) by mouth daily. 10/17/17   Cleora Fleet, MD  LINZESS 290 MCG CAPS capsule Take 1 tablet by mouth daily before breakfast.  02/28/17   [provider]  losartan (COZAAR) 100 MG tablet Take 100 mg by mouth daily.    [provider]  mirtazapine (REMERON SOL-TAB) 30 MG disintegrating tablet Take 30 mg by mouth daily.  03/18/18   [provider]  montelukast (SINGULAIR) 10 MG tablet Take 1 tablet (10 mg total) by mouth daily. 07/21/18 10/10/18  Shane Crutch, MD  Multiple Vitamin (MULTIVITAMIN) tablet Take 1 tablet by mouth daily.    [provider]  omeprazole (PRILOSEC) 40 MG capsule Take 1 capsule (40 mg total) by mouth daily. 1 hr before supper 04/05/17   Henrene Dodge, MD  Potassium Chloride ER 20 MEQ TBCR Take 20 mEq by mouth daily. Take with lasix 09/30/18   [provider]  pravastatin (PRAVACHOL) 80 MG tablet Take 1 tablet (80 mg total) by mouth daily. 10/17/17 06/04/19  Johnson, Clanford L, MD  pregabalin (LYRICA) 150 MG capsule Take 150 mg by mouth 3 (three) times daily.    [provider]  senna-docusate (SENOKOT-S) 8.6-50 MG tablet Take 1 tablet by mouth 2 (two) times daily. 10/06/18   Altamese Dilling, MD  sitaGLIPtin (JANUVIA) 100 MG tablet Take 100 mg by mouth daily.    [provider]  sucralfate (CARAFATE) 1 g tablet Take 1 g by mouth 2 (two) times daily.     [provider]  SUMAtriptan (IMITREX) 25 MG tablet Take 25 mg by mouth every 2 (two) hours as needed for migraine. May repeat in 2 hours if headache persists or recurs.    [provider]  tiotropium (SPIRIVA HANDIHALER) 18 MCG inhalation capsule  Place 1 capsule (18 mcg total) into inhaler and inhale daily. 06/24/18 06/24/19  Shane Crutch, MD  tiZANidine (ZANAFLEX) 4 MG tablet Take 4 mg by mouth every 8 (eight) hours as needed for muscle spasms.     [provider]  zolpidem (AMBIEN) 10 MG tablet Take 10 mg by mouth at bedtime as needed for sleep.     [provider]    Allergies Penicillins and Ace inhibitors  Family History  Problem Relation Age of Onset  . Hypertension Mother   . Hypertension Father   . Diabetes Father   . Allergies Father   . Kidney disease Father   . Breast cancer Neg Hx     Social History Social History   Tobacco Use  . Smoking status: Never Smoker  . Smokeless tobacco: Never Used  Substance Use Topics  . Alcohol use: No  . Drug use: No    Review of Systems  Constitutional: Negative for fever. + generalized weakness Eyes: Negative for visual changes. ENT: Negative for sore throat. Neck: No neck pain  Cardiovascular: Negative for chest pain. + hypotension Respiratory: Negative for shortness of breath. Gastrointestinal: Negative for abdominal pain, vomiting or diarrhea. Genitourinary: Negative for dysuria. Musculoskeletal: Negative for back pain. Skin: Negative for rash. Neurological: Negative for headaches, weakness or numbness. Psych: No SI or HI  ____________________________________________   PHYSICAL EXAM:  VITAL SIGNS: ED Triage Vitals  Enc Vitals Group     BP 11/11/18 1613 96/66     Pulse Rate 11/11/18 1558 (!) 122     Resp 11/11/18 1558 19     Temp -- 98.50F     Temp src --      SpO2 11/11/18 1558 90 %     Weight 11/11/18 1559 209 lb 9.6 oz (95.1 kg)     Height --      Head Circumference --      Peak Flow --      Pain Score --      Pain Loc --      Pain Edu? --      Excl. in GC? --     Constitutional: Alert and oriented, looks weak but in no apparent distress. HEENT:      Head: Normocephalic and atraumatic.         Eyes: Conjunctivae  are normal. Sclera is non-icteric.       Mouth/Throat: Mucous membranes are dry.       Neck: Supple with no signs of meningismus. Cardiovascular: Tachycardic  with regular rhythm. No murmurs, gallops, or rubs. 2+ symmetrical distal pulses are present in all extremities. No JVD. Respiratory: Normal respiratory effort. Lungs are clear to auscultation bilaterally. No wheezes, crackles, or rhonchi.  Gastrointestinal: Soft, non tender, and non distended with positive bowel sounds. No rebound or guarding. Musculoskeletal: Nontender with normal range of motion in all extremities. No edema, cyanosis, or erythema of extremities. Neurologic: Normal speech and language. Face is symmetric. Moving all extremities. No gross focal neurologic deficits are appreciated. Skin: Skin is warm, dry and intact. No rash noted. Psychiatric: Mood and affect are normal. Speech and behavior are normal.  ____________________________________________   LABS (all labs ordered are listed, but only abnormal results are displayed)  Labs Reviewed  GLUCOSE, CAPILLARY - Abnormal; Notable for the following components:      Result Value   Glucose-Capillary 259 (*)    All other components within normal limits  COMPREHENSIVE METABOLIC PANEL - Abnormal; Notable for the following components:   Glucose, Bld 194 (*)    BUN 21 (*)    Creatinine, Ser 1.08 (*)    GFR calc non Af Amer 59 (*)    All other components within normal limits  URINALYSIS, COMPLETE (UACMP) WITH MICROSCOPIC - Abnormal; Notable for the following components:   Color, Urine YELLOW (*)    APPearance CLOUDY (*)    Glucose, UA >=500 (*)    Leukocytes,Ua SMALL (*)    Bacteria, UA RARE (*)    All other components within normal limits  URINE CULTURE  CBC WITH DIFFERENTIAL/PLATELET  TROPONIN I  LACTIC ACID, PLASMA   ____________________________________________  EKG  ED ECG REPORT I, Nita Sickle, the attending physician, personally viewed and interpreted  this ECG.  Normal sinus rhythm, rate of 77, normal intervals, normal axis, no ST elevations or depressions, Q waves in inferior leads.  Unchanged from prior. ____________________________________________  RADIOLOGY none ____________________________________________   PROCEDURES  Procedure(s) performed: None Procedures Critical Care performed:  None ____________________________________________   INITIAL IMPRESSION / ASSESSMENT AND PLAN / ED COURSE  53 y.o. female with a history of hypertension, fibromyalgia, GI bleed, CHF, diabetes who presents for evaluation of generalized weakness.  Patient looks weak but in no distress otherwise.  She looks dry.  She is tachycardic and hypotensive, afebrile, neurologically intact, lungs are clear to auscultation, abdomen is soft.  Will give IV fluids.  Will check labs to rule out dehydration, AKI, anemia.  EKG showing no acute ischemic changes.  UA is pending to rule out infection.   Clinical Course as of Nov 11 4  Wed Nov 11, 2018  2319 Patient's vital signs improved after IV fluids.  UA positive for UTI.  Labs otherwise within normal limits.  No evidence of sepsis.  Patient was started on Keflex.  Recommended increase oral hydration.  Discussed return precautions and close follow-up with primary care doctor.   [CV]    Clinical Course User Index [CV] Don Perking [place], MD     As part of my medical decision making, I reviewed the following data within the electronic MEDICAL RECORD NUMBER Nursing notes reviewed and incorporated, Labs reviewed , EKG interpreted , Old EKG reviewed, Old chart reviewed, Notes from prior ED visits and [place] Controlled Substance Database    Pertinent labs & imaging results that were available during my care of the patient were reviewed by me and considered in my medical decision making (see chart for details).    ____________________________________________   FINAL CLINICAL IMPRESSION(S) / ED DIAGNOSES  Final  diagnoses:  Generalized weakness  Dehydration  Acute cystitis without hematuria      NEW MEDICATIONS STARTED DURING THIS VISIT:  ED Discharge Orders         Ordered    cephALEXin (KEFLEX) 500 MG capsule  3 times daily     11/11/18 2345           Note:  This document was prepared using Dragon voice recognition software and may include unintentional dictation errors.    Don Perking, [place], MD 11/12/18 [phone]

## 2018-11-13 LAB — URINE CULTURE: Culture: 50000 — AB

## 2018-11-14 ENCOUNTER — Encounter: Payer: Self-pay | Admitting: Emergency Medicine

## 2018-11-14 ENCOUNTER — Emergency Department
Admission: EM | Admit: 2018-11-14 | Discharge: 2018-11-14 | Disposition: A | Discharge status: Home / Self Care | Payer: 59 | Attending: Student in an Organized Health Care Education/Training Program | Admitting: Student in an Organized Health Care Education/Training Program

## 2018-11-14 ENCOUNTER — Emergency Department: Payer: 59

## 2018-11-14 ENCOUNTER — Other Ambulatory Visit: Payer: Self-pay

## 2018-11-14 DIAGNOSIS — Z79899 Other long term (current) drug therapy: Secondary | ICD-10-CM | POA: Insufficient documentation

## 2018-11-14 DIAGNOSIS — I5033 Acute on chronic diastolic (congestive) heart failure: Secondary | ICD-10-CM | POA: Insufficient documentation

## 2018-11-14 DIAGNOSIS — S01112A Laceration without foreign body of left eyelid and periocular area, initial encounter: Secondary | ICD-10-CM | POA: Diagnosis not present

## 2018-11-14 DIAGNOSIS — J45909 Unspecified asthma, uncomplicated: Secondary | ICD-10-CM | POA: Insufficient documentation

## 2018-11-14 DIAGNOSIS — Y999 Unspecified external cause status: Secondary | ICD-10-CM | POA: Insufficient documentation

## 2018-11-14 DIAGNOSIS — S0990XA Unspecified injury of head, initial encounter: Secondary | ICD-10-CM | POA: Insufficient documentation

## 2018-11-14 DIAGNOSIS — Y9389 Activity, other specified: Secondary | ICD-10-CM | POA: Insufficient documentation

## 2018-11-14 DIAGNOSIS — Y929 Unspecified place or not applicable: Secondary | ICD-10-CM | POA: Diagnosis not present

## 2018-11-14 DIAGNOSIS — Z794 Long term (current) use of insulin: Secondary | ICD-10-CM | POA: Insufficient documentation

## 2018-11-14 DIAGNOSIS — W2209XA Striking against other stationary object, initial encounter: Secondary | ICD-10-CM | POA: Diagnosis not present

## 2018-11-14 DIAGNOSIS — I110 Hypertensive heart disease with heart failure: Secondary | ICD-10-CM | POA: Diagnosis not present

## 2018-11-14 DIAGNOSIS — S0181XA Laceration without foreign body of other part of head, initial encounter: Secondary | ICD-10-CM

## 2018-11-14 LAB — BASIC METABOLIC PANEL
Anion gap: 9 (ref 5–15)
BUN: 14 mg/dL (ref 6–20)
CO2: 25 mmol/L (ref 22–32)
Calcium: 9.9 mg/dL (ref 8.9–10.3)
Chloride: 105 mmol/L (ref 98–111)
Creatinine, Ser: 0.92 mg/dL (ref 0.44–1.00)
GFR calc Af Amer: >60 mL/min (ref >60)
GFR calc non Af Amer: >60 mL/min (ref >60)
Glucose, Bld: 135 mg/dL — ABNORMAL HIGH (ref 70–99)
Potassium: 3.8 mmol/L (ref 3.5–5.1)
Sodium: 139 mmol/L (ref 135–145)

## 2018-11-14 LAB — URINE DRUG SCREEN, QUALITATIVE (ARMC ONLY)
Amphetamines, Ur Screen: NOT DETECTED
Barbiturates, Ur Screen: NOT DETECTED
Benzodiazepine, Ur Scrn: NOT DETECTED
Cannabinoid 50 Ng, Ur ~~LOC~~: NOT DETECTED
Cocaine Metabolite,Ur ~~LOC~~: NOT DETECTED
MDMA (Ecstasy)Ur Screen: NOT DETECTED
Methadone Scn, Ur: NOT DETECTED
Opiate, Ur Screen: POSITIVE — AB
Phencyclidine (PCP) Ur S: NOT DETECTED
Tricyclic, Ur Screen: NOT DETECTED

## 2018-11-14 LAB — CBC
HCT: 43.4 % (ref 36.0–46.0)
Hemoglobin: 14.2 g/dL (ref 12.0–15.0)
MCH: 29.6 pg (ref 26.0–34.0)
MCHC: 32.7 g/dL (ref 30.0–36.0)
MCV: 90.6 fL (ref 80.0–100.0)
Platelets: 296 10*3/uL (ref 150–400)
RBC: 4.79 MIL/uL (ref 3.87–5.11)
RDW: 13.6 % (ref 11.5–15.5)
WBC: 4.2 10*3/uL (ref 4.0–10.5)
nRBC: 0 % (ref 0.0–0.2)

## 2018-11-14 LAB — URINALYSIS, COMPLETE (UACMP) WITH MICROSCOPIC
Bilirubin Urine: NEGATIVE
Glucose, UA: >=500 mg/dL — AB
Hgb urine dipstick: NEGATIVE
Ketones, ur: NEGATIVE mg/dL
Leukocytes,Ua: NEGATIVE
Nitrite: NEGATIVE
Protein, ur: NEGATIVE mg/dL
Specific Gravity, Urine: 1.012 (ref 1.005–1.030)
pH: 5 (ref 5.0–8.0)

## 2018-11-14 LAB — TROPONIN I
Troponin I: 0.03 ng/mL (ref <0.03)
Troponin I: <0.03 ng/mL (ref <0.03)

## 2018-11-14 MED ORDER — SODIUM CHLORIDE 0.9 % IV BOLUS: 250.0000 mL | Freq: Once | INTRAVENOUS | Status: DC

## 2018-11-14 MED ORDER — LIDOCAINE-EPINEPHRINE-TETRACAINE (LET) SOLUTION
3.0000 mL | Freq: Once | NASAL | Status: AC
  Administered 2018-11-14: 3 mL via TOPICAL
  Filled 2018-11-14: qty 3

## 2018-11-14 MED ORDER — SODIUM CHLORIDE 0.9% FLUSH: 3.0000 mL | Freq: Once | INTRAVENOUS | Status: DC

## 2018-11-14 MED ORDER — LIDOCAINE HCL (PF) 1 % IJ SOLN
5.0000 mL | Freq: Once | INTRAMUSCULAR | Status: AC
  Administered 2018-11-14: 5 mL via INTRADERMAL
  Filled 2018-11-14: qty 5

## 2018-11-14 NOTE — ED Notes (Signed)
Pt able to eat/drink well at this time and per Dr. Roxan Hockey bolus held at this time.

## 2018-11-14 NOTE — ED Triage Notes (Signed)
Pt via pov from home with injury to left eyebrow. She states that she leaned over to hug her grandchild and hit the door frame. She does not know if she passed out; states she has been dizzy lately and was seen here for the syncope and hypotension on Wednesday.   Pt alert & oriented with nad noted.

## 2018-11-14 NOTE — ED Notes (Signed)
Patient transported to CT 

## 2018-11-14 NOTE — ED Provider Notes (Signed)
[facility] Emergency Department Provider Note    First MD Initiated Contact with Patient 11/14/18 1516     (approximate)  I have reviewed the triage vital signs and the nursing notes.   HISTORY  Chief Complaint Near Syncope; Head Injury; and Chest Pain    HPI Amanda [name] is a 53 y.o. female presents the ER for evaluation of minor head injury.  Patient was recently here for evaluation of hypotension and discharged home was otherwise doing well.  Family reports that she has been "more confused" for the past several  days and weeks.  No recent fevers.  Today's episode occurred when she was bending down to give her granddaughter a hug.  She bent down and hit her head on the door frame.  There was subsequent LOC.  Does appear mildly concussed.  Denies taking any blood thinners.   Past Medical History:  Diagnosis Date  . Arthritis    knees, shoulder, upper back  . Asthma   . CHF (congestive heart failure) (HCC)   . Diabetes mellitus without complication (HCC)   . Environmental allergies   . Fibromyalgia   . GERD (gastroesophageal reflux disease)   . Headache    migraines - 5x/mo  . Hypertension   . Hypokalemia 10/14/2017  . Migraines   . Motion sickness    ships  . Sleep apnea   . Stroke [facility])    no residual deficits  . Thyroid nodule    bilateral and goiter  . Wears contact lenses   . Wears dentures    partial upper   Family History  Problem Relation Age of Onset  . Hypertension Mother   . Hypertension Father   . Diabetes Father   . Allergies Father   . Kidney disease Father   . Breast cancer Neg Hx    Past Surgical History:  Procedure Laterality Date  . ABDOMINAL HYSTERECTOMY  2000's  . ABDOMINAL SURGERY     laparoscopy x4 with lysis of adhesions  . APPENDECTOMY  1986  . CESAREAN SECTION  1992  . CHOLECYSTECTOMY N/A 04/03/2017   Procedure: LAPAROSCOPIC CHOLECYSTECTOMY;  Surgeon: Henrene Dodge, MD;  Location: [facility] ORS;  Service:  General;  Laterality: N/A;  . COLONOSCOPY N/A 10/06/2018   Procedure: COLONOSCOPY;  Surgeon: Toney Reil, MD;  Location: [facility] ENDOSCOPY;  Service: Gastroenterology;  Laterality: N/A;  . COLONOSCOPY WITH PROPOFOL N/A 03/10/2017   Procedure: COLONOSCOPY WITH PROPOFOL;  Surgeon: Scot Jun, MD;  Location: [facility] ENDOSCOPY;  Service: Endoscopy;  Laterality: N/A;  . ECTOPIC PREGNANCY SURGERY  2000's  . ESOPHAGOGASTRODUODENOSCOPY (EGD) WITH PROPOFOL N/A 03/10/2017   Procedure: ESOPHAGOGASTRODUODENOSCOPY (EGD) WITH PROPOFOL;  Surgeon: Scot Jun, MD;  Location: [facility] ENDOSCOPY;  Service: Endoscopy;  Laterality: N/A;  . HERNIA REPAIR    . JOINT REPLACEMENT Right 12/16/12   knee- medial - makoplasty  . KNEE ARTHROSCOPY Right 2006   partial medial and lateral meniscectomies  . KNEE ARTHROSCOPY Right 10/06/2015   Procedure: RIGHT KNEE ARTHROSCOPY WITH DEBRIDEMENT;  Surgeon: Erin Sons, MD;  Location: [facility];  Service: Orthopedics;  Laterality: Right;  Diabetic - insulin and oral meds  . ROTATOR CUFF REPAIR Left 2006  . TUBAL LIGATION  2000's   Patient Active Problem List   Diagnosis Date Noted  . Hypotension 10/10/2018  . GI bleed 10/02/2018  . Acute on chronic diastolic (congestive) heart failure (HCC) 07/17/2018  . Acute on chronic heart failure (HCC) 07/08/2018  . Chronic diastolic  heart failure (HCC) 04/17/2018  . Lymphedema 04/17/2018  . Chest pain 03/28/2018  . Hypertension 10/15/2017  . Wears dentures 10/15/2017  . Symptomatic cholelithiasis 04/02/2017  . Recurrent incisional hernia 03/17/2017  . Calculus of gallbladder without cholecystitis without obstruction 03/17/2017  . Helicobacter pylori gastritis 03/17/2017  . Depression 09/28/2016  . Diabetes mellitus, type 2 (HCC) 09/28/2016  . Tremor 07/01/2013  . Esophageal reflux 06/22/2012  . Major depressive disorder, single episode 08/22/2009  . Sleep apnea 05/30/2009  . Hyperlipidemia 08/10/2008       Prior to Admission medications   Medication Sig Start Date End Date Taking? Authorizing Provider  albuterol (PROVENTIL HFA) 108 (90 Base) MCG/ACT inhaler Inhale 2 puffs into the lungs every 4 (four) hours as needed for wheezing or shortness of breath. 07/21/18   Shane Crutch, MD  amLODipine (NORVASC) 5 MG tablet Take 1 tablet (5 mg total) by mouth daily. 10/06/18   Altamese Dilling, MD  ARIPiprazole (ABILIFY) 2 MG tablet Take 2 mg by mouth daily.     [provider]  Ascorbic Acid (VITAMIN C) 1000 MG tablet Take 500 mg by mouth 2 (two) times daily.     [provider]  BREO ELLIPTA 200-25 MCG/INH AEPB Inhale 1 puff into the lungs daily. Patient taking differently: Inhale 1 puff into the lungs daily.  04/23/18   Shane Crutch, MD  canagliflozin (INVOKANA) 300 MG TABS tablet Take 300 mg by mouth daily before breakfast.    [provider]  cephALEXin (KEFLEX) 500 MG capsule Take 1 capsule (500 mg total) by mouth 3 (three) times daily for 7 days. 11/11/18 11/18/18  Nita Sickle, MD  clonazePAM (KLONOPIN) 0.5 MG tablet Take 0.5 mg by mouth 2 (two) times daily.    [provider]  DULoxetine (CYMBALTA) 60 MG capsule Take 60 mg by mouth 2 (two) times daily.    [provider]  flunisolide (NASALIDE) 25 MCG/ACT (0.025%) SOLN Place 2 sprays into the nose daily as needed.    [provider]  furosemide (LASIX) 40 MG tablet  10/16/18   [provider]  gabapentin (NEURONTIN) 300 MG capsule Take 600 mg by mouth 2 (two) times daily. 03/22/18   [provider]  hydrocortisone (ANUSOL-HC) 25 MG suppository Place 1 suppository (25 mg total) rectally 2 (two) times daily. 10/06/18   Altamese Dilling, MD  insulin detemir (LEVEMIR) 100 UNIT/ML injection Inject 45 Units into the skin daily.     [provider]  ipratropium-albuterol (DUONEB) 0.5-2.5 (3) MG/3ML SOLN Inhale contents of 1 vial via nebulizer 3 times  daily as needed for shortness of breath/wheeze/cough. Patient taking differently: Inhale 3 mLs into the lungs 3 (three) times daily as needed (shortness of breath, wheezing or cough).  11/21/17   Shane Crutch, MD  isosorbide mononitrate (IMDUR) 30 MG 24 hr tablet Take 1 tablet (30 mg total) by mouth daily. 10/17/17   Cleora Fleet, MD  LINZESS 290 MCG CAPS capsule Take 1 tablet by mouth daily before breakfast.  02/28/17   [provider]  losartan (COZAAR) 100 MG tablet Take 100 mg by mouth daily.    [provider]  mirtazapine (REMERON SOL-TAB) 30 MG disintegrating tablet Take 30 mg by mouth daily.  03/18/18   [provider]  montelukast (SINGULAIR) 10 MG tablet Take 1 tablet (10 mg total) by mouth daily. 07/21/18 10/10/18  Shane Crutch, MD  Multiple Vitamin (MULTIVITAMIN) tablet Take 1 tablet by mouth daily.    [provider]  omeprazole (PRILOSEC) 40 MG capsule Take 1 capsule (40 mg total) by mouth daily. 1 hr before supper 04/05/17   Piscoya, Elita Quick, MD  Potassium Chloride ER 20 MEQ TBCR Take 20 mEq by mouth daily. Take with lasix 09/30/18   [provider]  pravastatin (PRAVACHOL) 80 MG tablet Take 1 tablet (80 mg total) by mouth daily. 10/17/17 06/04/19  Johnson, Clanford L, MD  pregabalin (LYRICA) 150 MG capsule Take 150 mg by mouth 3 (three) times daily.    [provider]  senna-docusate (SENOKOT-S) 8.6-50 MG tablet Take 1 tablet by mouth 2 (two) times daily. 10/06/18   Altamese Dilling, MD  sitaGLIPtin (JANUVIA) 100 MG tablet Take 100 mg by mouth daily.    [provider]  sucralfate (CARAFATE) 1 g tablet Take 1 g by mouth 2 (two) times daily.     [provider]  SUMAtriptan (IMITREX) 25 MG tablet Take 25 mg by mouth every 2 (two) hours as needed for migraine. May repeat in 2 hours if headache persists or recurs.    [provider]  tiotropium (SPIRIVA HANDIHALER) 18 MCG inhalation capsule  Place 1 capsule (18 mcg total) into inhaler and inhale daily. 06/24/18 06/24/19  Shane Crutch, MD  tiZANidine (ZANAFLEX) 4 MG tablet Take 4 mg by mouth every 8 (eight) hours as needed for muscle spasms.     [provider]  zolpidem (AMBIEN) 10 MG tablet Take 10 mg by mouth at bedtime as needed for sleep.     [provider]    Allergies Penicillins and Ace inhibitors    Social History Social History   Tobacco Use  . Smoking status: Never Smoker  . Smokeless tobacco: Never Used  Substance Use Topics  . Alcohol use: No  . Drug use: No    Review of Systems Patient denies headaches, rhinorrhea, blurry vision, numbness, shortness of breath, chest pain, edema, cough, abdominal pain, nausea, vomiting, diarrhea, dysuria, fevers, rashes or hallucinations unless otherwise stated above in HPI. ____________________________________________   PHYSICAL EXAM:  VITAL SIGNS: Vitals:   11/14/18 1700 11/14/18 1928  BP: 136/80 (!) 150/107  Pulse: 92 89  Resp: 18 18  Temp:  99.2 F (37.3 C)  SpO2: 99% 100%   Constitutional: Alert, drowsy but o x 3.   Eyes: Conjunctivae are normal.  Head: small contusion and left eyebrow 1 cm laceration no foreign body. Nose: No congestion/rhinnorhea. Mouth/Throat: Mucous membranes are moist.   Neck: No stridor. Painless ROM.  Cardiovascular: Normal rate, regular rhythm. Grossly normal heart sounds.  Good peripheral circulation. Respiratory: Normal respiratory effort.  No retractions. Lungs CTAB. Gastrointestinal: Soft and nontender. No distention. No abdominal bruits. No CVA tenderness. Genitourinary:  Musculoskeletal: No lower extremity tenderness nor edema.  No joint effusions. Neurologic:  Normal speech and language. No gross focal neurologic deficits are appreciated. No facial droop Skin:  Skin is warm, dry and intact. No rash noted. Psychiatric: Mood and affect are normal. Speech and behavior are normal.   ____________________________________________   LABS (all labs ordered are listed, but only abnormal results are displayed)  Results for orders placed or performed during the hospital encounter of 11/14/18 (from the past 24 hour(s))  Basic metabolic panel     Status: Abnormal   Collection Time: 11/14/18  3:05 PM  Result Value Ref Range   Sodium 139 135 - 145 mmol/L   Potassium 3.8 3.5 - 5.1 mmol/L   Chloride 105 98 - 111 mmol/L   CO2 25 22 - 32  mmol/L   Glucose, Bld 135 (H) 70 - 99 mg/dL   BUN 14 6 - 20 mg/dL   Creatinine, Ser 1.61 0.44 - 1.00 mg/dL   Calcium 9.9 8.9 - 09.6 mg/dL   GFR calc non Af Amer >60 >60 mL/min   GFR calc Af Amer >60 >60 mL/min   Anion gap 9 5 - 15  CBC     Status: None   Collection Time: 11/14/18  3:05 PM  Result Value Ref Range   WBC 4.2 4.0 - 10.5 K/uL   RBC 4.79 3.87 - 5.11 MIL/uL   Hemoglobin 14.2 12.0 - 15.0 g/dL   HCT 04.5 40.9 - 81.1 %   MCV 90.6 80.0 - 100.0 fL   MCH 29.6 26.0 - 34.0 pg   MCHC 32.7 30.0 - 36.0 g/dL   RDW 91.4 78.2 - 95.6 %   Platelets 296 150 - 400 K/uL   nRBC 0.0 0.0 - 0.2 %  Urinalysis, Complete w Microscopic     Status: Abnormal   Collection Time: 11/14/18  3:05 PM  Result Value Ref Range   Color, Urine STRAW (A) YELLOW   APPearance CLEAR (A) CLEAR   Specific Gravity, Urine 1.012 1.005 - 1.030   pH 5.0 5.0 - 8.0   Glucose, UA >=500 (A) NEGATIVE mg/dL   Hgb urine dipstick NEGATIVE NEGATIVE   Bilirubin Urine NEGATIVE NEGATIVE   Ketones, ur NEGATIVE NEGATIVE mg/dL   Protein, ur NEGATIVE NEGATIVE mg/dL   Nitrite NEGATIVE NEGATIVE   Leukocytes,Ua NEGATIVE NEGATIVE   RBC / HPF 0-5 0 - 5 RBC/hpf   WBC, UA 0-5 0 - 5 WBC/hpf   Bacteria, UA RARE (A) NONE SEEN   Squamous Epithelial / LPF 0-5 0 - 5  Troponin I - ONCE - STAT     Status: Abnormal   Collection Time: 11/14/18  3:07 PM  Result Value Ref Range   Troponin I 0.03 (HH) <0.03 ng/mL  Urine Drug Screen, Qualitative ([facility] only)     Status: Abnormal   Collection Time:  11/14/18  3:52 PM  Result Value Ref Range   Tricyclic, Ur Screen NONE DETECTED NONE DETECTED   Amphetamines, Ur Screen NONE DETECTED NONE DETECTED   MDMA (Ecstasy)Ur Screen NONE DETECTED NONE DETECTED   Cocaine Metabolite,Ur [place] NONE DETECTED NONE DETECTED   Opiate, Ur Screen POSITIVE (A) NONE DETECTED   Phencyclidine (PCP) Ur S NONE DETECTED NONE DETECTED   Cannabinoid 50 Ng, Ur [place] NONE DETECTED NONE DETECTED   Barbiturates, Ur Screen NONE DETECTED NONE DETECTED   Benzodiazepine, Ur Scrn NONE DETECTED NONE DETECTED   Methadone Scn, Ur NONE DETECTED NONE DETECTED  Troponin I - Once-Timed     Status: None   Collection Time: 11/14/18  6:30 PM  Result Value Ref Range   Troponin I <0.03 <0.03 ng/mL   ____________________________________________  EKG My review and personal interpretation at Time: 11/14/18   Indication: fall  Rate: 90  Rhythm: sinus Axis: left Other: normal intervals, no stemi ____________________________________________  RADIOLOGY  I personally reviewed all radiographic images ordered to evaluate for the above acute complaints and reviewed radiology reports and findings.  These findings were personally discussed with the patient.  Please see medical record for radiology report.  ____________________________________________   PROCEDURES  Procedure(s) performed:  [name] KitchenMarland KitchenLaceration Repair Date/Time: 11/14/2018 4:45 PM Performed by: Willy Eddy, MD Authorized by: Willy Eddy, MD   Consent:    Consent obtained:  Verbal   Consent given by:  Patient   Risks  discussed:  Infection, pain, retained foreign body, poor cosmetic result and poor wound healing Anesthesia (see MAR for exact dosages):    Anesthesia method:  Local infiltration   Local anesthetic:  Lidocaine 1% w/o epi Laceration details:    Location:  Face   Face location:  L eyebrow   Length (cm):  1   Depth (mm):  3 Repair type:    Repair type:  Simple Exploration:    Hemostasis achieved with:   Direct pressure   Wound exploration: entire depth of wound probed and visualized     Contaminated: no   Treatment:    Area cleansed with:  Betadine   Amount of cleaning:  Extensive   Irrigation solution:  Sterile saline   Visualized foreign bodies/material removed: no   Skin repair:    Repair method:  Sutures   Suture size:  5-0   Number of sutures:  2 Approximation:    Approximation:  Close Post-procedure details:    Dressing:  Sterile dressing   Patient tolerance of procedure:  Tolerated well, no immediate complications      Critical Care performed: no ____________________________________________   INITIAL IMPRESSION / ASSESSMENT AND PLAN / ED COURSE  Pertinent labs & imaging results that were available during my care of the patient were reviewed by me and considered in my medical decision making (see chart for details).   DDX: concussion, laceration, contusion, sdh, sah, cva, electrolyte abn  Shantice Schepps Dan [name] is a 53 y.o. who presents to the ED with symptoms as described above.  She is hemodynamically stable.  Appears a little bit drowsy but no focal deficits at this time.  Blood work and CT imaging will be ordered for the above differential.  Laceration will be repaired.  Clinical Course as of Nov 13 2020  Sat Nov 14, 2018  1623 CT imaging is reassuring.  Patient has opiate positive on UDS which would certainly explain some confusion.  Family is requesting home health.  Will consult case management.   [PR]  1902 Troponin stable repeat negative.  Does not seem consistent with ACS.   [PR]    Clinical Course User Index [PR] Willy Eddy, MD     As part of my medical decision making, I reviewed the following data within the electronic MEDICAL RECORD NUMBER Nursing notes reviewed and incorporated, Labs reviewed, notes from prior ED visits and [place] Controlled Substance Database   ____________________________________________   FINAL CLINICAL IMPRESSION(S) / ED DIAGNOSES   Final diagnoses:  Injury of head, initial encounter  Laceration of eyebrow and forehead, left, initial encounter      NEW MEDICATIONS STARTED DURING THIS VISIT:  Discharge Medication List as of 11/14/2018  7:20 PM       Note:  This document was prepared using Dragon voice recognition software and may include unintentional dictation errors.    Willy Eddy, MD 11/14/18 2023

## 2018-11-14 NOTE — ED Notes (Signed)
Case management called and to call this RN back about pt.

## 2018-11-14 NOTE — Care Management (Signed)
This RNCM received notification that CM consult was placed today and that patient needed home health- but unable to reach case manager.  At this hour, there's not much that can be arranged.  Per ED RN they "were told to call care manager tomorrow regarding case".  Message has been sent to ED SW and weekend care manager.  Patient has been discharged from the ED.

## 2018-11-15 NOTE — Care Management (Signed)
RNCM received consulted late evening yesterday regarding home health needs. Patient is already active with home health via [facility]. Notified Jason from [facility] of ED visit. No new HH orders are warranted'

## 2018-11-18 ENCOUNTER — Ambulatory Visit: Payer: 59 | Admitting: Primary Care

## 2018-11-18 ENCOUNTER — Telehealth: Payer: Self-pay | Admitting: Primary Care

## 2018-11-18 NOTE — Progress Notes (Deleted)
@Patient  ID: Amanda [name], female    DOB: 31-Aug-1966, 53 y.o.   MRN: [id number]  No chief complaint on file.   Referring provider: Emogene Morgan, MD  HPI: 53 year old female, never smoked. PMH asthma, OSA, GERD. Patient of Dr. Nicholos Johns, last seen in November 2019.   11/18/2018 Patient presents today    Allergies  Allergen Reactions  . Penicillins Anaphylaxis    Has patient had a PCN reaction causing immediate rash, facial/tongue/throat swelling, SOB or lightheadedness with hypotension: Yes Has patient had a PCN reaction causing severe rash involving mucus membranes or skin necrosis: No Has patient had a PCN reaction that required hospitalization No Has patient had a PCN reaction occurring within the last 10 years: No If all of the above answers are "NO", then may proceed with Cephalosporin use.   . Ace Inhibitors Swelling    Immunization History  Administered Date(s) Administered  . Influenza,inj,Quad PF,6+ Mos 06/05/2016, 06/05/2017, 06/16/2018  . Pneumococcal Polysaccharide-23 03/20/2016, 06/05/2017    Past Medical History:  Diagnosis Date  . Arthritis    knees, shoulder, upper back  . Asthma   . CHF (congestive heart failure) (HCC)   . Diabetes mellitus without complication (HCC)   . Environmental allergies   . Fibromyalgia   . GERD (gastroesophageal reflux disease)   . Headache    migraines - 5x/mo  . Hypertension   . Hypokalemia 10/14/2017  . Migraines   . Motion sickness    ships  . Sleep apnea   . Stroke [facility])    no residual deficits  . Thyroid nodule    bilateral and goiter  . Wears contact lenses   . Wears dentures    partial upper    Tobacco History: Social History   Tobacco Use  Smoking Status Never Smoker  Smokeless Tobacco Never Used   Counseling given: Not Answered   Outpatient Medications Prior to Visit  Medication Sig Dispense Refill  . albuterol (PROVENTIL HFA) 108 (90 Base) MCG/ACT inhaler Inhale 2 puffs into the lungs  every 4 (four) hours as needed for wheezing or shortness of breath. 3 Inhaler 1  . amLODipine (NORVASC) 5 MG tablet Take 1 tablet (5 mg total) by mouth daily. 30 tablet 0  . ARIPiprazole (ABILIFY) 2 MG tablet Take 2 mg by mouth daily.     . Ascorbic Acid (VITAMIN C) 1000 MG tablet Take 500 mg by mouth 2 (two) times daily.     [name] Kitchen BREO ELLIPTA 200-25 MCG/INH AEPB Inhale 1 puff into the lungs daily. (Patient taking differently: Inhale 1 puff into the lungs daily. ) 60 each 4  . canagliflozin (INVOKANA) 300 MG TABS tablet Take 300 mg by mouth daily before breakfast.    . cephALEXin (KEFLEX) 500 MG capsule Take 1 capsule (500 mg total) by mouth 3 (three) times daily for 7 days. 21 capsule 0  . clonazePAM (KLONOPIN) 0.5 MG tablet Take 0.5 mg by mouth 2 (two) times daily.    . DULoxetine (CYMBALTA) 60 MG capsule Take 60 mg by mouth 2 (two) times daily.    . flunisolide (NASALIDE) 25 MCG/ACT (0.025%) SOLN Place 2 sprays into the nose daily as needed.    . furosemide (LASIX) 40 MG tablet     . gabapentin (NEURONTIN) 300 MG capsule Take 600 mg by mouth 2 (two) times daily.    . hydrocortisone (ANUSOL-HC) 25 MG suppository Place 1 suppository (25 mg total) rectally 2 (two) times daily. 30 suppository 0  .  insulin detemir (LEVEMIR) 100 UNIT/ML injection Inject 45 Units into the skin daily.     [name] Kitchen ipratropium-albuterol (DUONEB) 0.5-2.5 (3) MG/3ML SOLN Inhale contents of 1 vial via nebulizer 3 times daily as needed for shortness of breath/wheeze/cough. (Patient taking differently: Inhale 3 mLs into the lungs 3 (three) times daily as needed (shortness of breath, wheezing or cough). ) 360 mL 3  . isosorbide mononitrate (IMDUR) 30 MG 24 hr tablet Take 1 tablet (30 mg total) by mouth daily.    [name] Kitchen LINZESS 290 MCG CAPS capsule Take 1 tablet by mouth daily before breakfast.     . losartan (COZAAR) 100 MG tablet Take 100 mg by mouth daily.    . mirtazapine (REMERON SOL-TAB) 30 MG disintegrating tablet Take 30 mg by mouth  daily.   2  . montelukast (SINGULAIR) 10 MG tablet Take 1 tablet (10 mg total) by mouth daily. 30 tablet 1  . Multiple Vitamin (MULTIVITAMIN) tablet Take 1 tablet by mouth daily.    [name] Kitchen omeprazole (PRILOSEC) 40 MG capsule Take 1 capsule (40 mg total) by mouth daily. 1 hr before supper 30 capsule 0  . Potassium Chloride ER 20 MEQ TBCR Take 20 mEq by mouth daily. Take with lasix    . pravastatin (PRAVACHOL) 80 MG tablet Take 1 tablet (80 mg total) by mouth daily. 30 tablet 0  . pregabalin (LYRICA) 150 MG capsule Take 150 mg by mouth 3 (three) times daily.    [name] Kitchen senna-docusate (SENOKOT-S) 8.6-50 MG tablet Take 1 tablet by mouth 2 (two) times daily. 30 tablet 0  . sitaGLIPtin (JANUVIA) 100 MG tablet Take 100 mg by mouth daily.    . sucralfate (CARAFATE) 1 g tablet Take 1 g by mouth 2 (two) times daily.     . SUMAtriptan (IMITREX) 25 MG tablet Take 25 mg by mouth every 2 (two) hours as needed for migraine. May repeat in 2 hours if headache persists or recurs.    [name] Kitchen tiotropium (SPIRIVA HANDIHALER) 18 MCG inhalation capsule Place 1 capsule (18 mcg total) into inhaler and inhale daily. 90 capsule 2  . tiZANidine (ZANAFLEX) 4 MG tablet Take 4 mg by mouth every 8 (eight) hours as needed for muscle spasms.     [name] Kitchen zolpidem (AMBIEN) 10 MG tablet Take 10 mg by mouth at bedtime as needed for sleep.      No facility-administered medications prior to visit.       Review of Systems  Review of Systems   Physical Exam  There were no vitals taken for this visit. Physical Exam   Lab Results:  CBC    Component Value Date/Time   WBC 4.2 11/14/2018 1505   RBC 4.79 11/14/2018 1505   HGB 14.2 11/14/2018 1505   HGB 11.4 (L) 11/12/2011 1627   HCT 43.4 11/14/2018 1505   HCT 33.5 (L) 11/12/2011 1627   PLT 296 11/14/2018 1505   PLT 262 11/12/2011 1627   MCV 90.6 11/14/2018 1505   MCV 91 11/12/2011 1627   MCH 29.6 11/14/2018 1505   MCHC 32.7 11/14/2018 1505   RDW 13.6 11/14/2018 1505   RDW 14.1 11/12/2011  1627   LYMPHSABS 2.0 11/11/2018 1602   MONOABS 0.5 11/11/2018 1602   EOSABS 0.0 11/11/2018 1602   BASOSABS 0.0 11/11/2018 1602    BMET    Component Value Date/Time   NA 139 11/14/2018 1505   NA 137 11/12/2011 1627   K 3.8 11/14/2018 1505   K 4.9 11/12/2011 1627   CL 105  11/14/2018 1505   CL 101 11/12/2011 1627   CO2 25 11/14/2018 1505   CO2 26 11/12/2011 1627   GLUCOSE 135 (H) 11/14/2018 1505   GLUCOSE 151 (H) 11/12/2011 1627   BUN 14 11/14/2018 1505   BUN 14 11/12/2011 1627   CREATININE 0.92 11/14/2018 1505   CREATININE 0.71 11/12/2011 1627   CALCIUM 9.9 11/14/2018 1505   CALCIUM 8.5 11/12/2011 1627   GFRNONAA >60 11/14/2018 1505   GFRNONAA >60 11/12/2011 1627   GFRAA >60 11/14/2018 1505   GFRAA >60 11/12/2011 1627    BNP    Component Value Date/Time   BNP 39.0 07/17/2018 1147    ProBNP No results found for: PROBNP  Imaging: Dg Chest 2 View  Result Date: 11/14/2018 CLINICAL DATA:  Syncope.  Recent trauma EXAM: CHEST - 2 VIEW COMPARISON:  October 10, 2018 FINDINGS: Lungs are clear. Heart is upper normal in size with pulmonary vascularity normal. No adenopathy. No pneumothorax. No bone lesions. IMPRESSION: No edema or consolidation.  Heart upper normal in size. Electronically Signed   By: Bretta Bang III M.D.   On: 11/14/2018 15:48   Ct Head Wo Contrast  Result Date: 11/14/2018 CLINICAL DATA:  Pain after hitting head against door.  Dizziness. EXAM: CT HEAD WITHOUT CONTRAST TECHNIQUE: Contiguous axial images were obtained from the base of the skull through the vertex without intravenous contrast. COMPARISON:  October 02, 2018 FINDINGS: Brain: The ventricles are normal in size and configuration. There is no intracranial mass, hemorrhage, extra-axial fluid collection, or midline shift. Brain parenchyma appears unremarkable. No acute infarct evident. Vascular: No hyperdense vessel. There is no appreciable vascular calcification. Skull: The bony calvarium appears  intact. Sinuses/Orbits: There is opacification in portions of the left sphenoid sinus. There is mucosal thickening in several ethmoid air cells. Visualized orbits appear symmetric bilaterally. Other: Visualized mastoid air cells are clear. IMPRESSION: Brain parenchyma appears unremarkable. No mass or hemorrhage. There is mild paranasal sinus disease at several sites. Electronically Signed   By: Bretta Bang III M.D.   On: 11/14/2018 15:39     Assessment & Plan:   No problem-specific Assessment & Plan notes found for this encounter.     Glenford Bayley, NP 11/18/2018

## 2018-11-18 NOTE — Telephone Encounter (Signed)
Still unable to reach patient. Patient no showed for her appointment today.

## 2018-11-18 NOTE — Telephone Encounter (Signed)
Patient is scheduled to see BW today in the [place] office. It looks like per chart that patient was supposed to be seen by Dr. Darlin Priestly. LMTCB

## 2018-11-20 ENCOUNTER — Ambulatory Visit: Payer: 59 | Admitting: Gastroenterology

## 2018-12-16 ENCOUNTER — Other Ambulatory Visit: Payer: Self-pay | Admitting: Internal Medicine

## 2018-12-16 MED ORDER — IPRATROPIUM-ALBUTEROL 0.5-2.5 (3) MG/3ML IN SOLN: 3.0000 mL | Freq: Three times a day (TID) | RESPIRATORY_TRACT | 0 refills | Status: DC | PRN

## 2018-12-17 MED ORDER — FLUTICASONE FUROATE-VILANTEROL 200-25 MCG/INH IN AEPB: 1.0000 | INHALATION_SPRAY | Freq: Every day | RESPIRATORY_TRACT | 1 refills | Status: AC

## 2018-12-17 MED ORDER — TIOTROPIUM BROMIDE MONOHYDRATE 18 MCG IN CAPS: 18.0000 ug | ORAL_CAPSULE | Freq: Every day | RESPIRATORY_TRACT | 1 refills | Status: DC

## 2018-12-17 MED ORDER — ALBUTEROL SULFATE HFA 108 (90 BASE) MCG/ACT IN AERS: 2.0000 | INHALATION_SPRAY | RESPIRATORY_TRACT | 1 refills | Status: DC | PRN

## 2018-12-17 NOTE — Telephone Encounter (Signed)
Spoke Wallace Cullens at [facility], they needed to confirm this patients inhalers as well as have them refilled. Inhalers confirmed. Refills sent. Nothing further is needed at this time.

## 2018-12-17 NOTE — Telephone Encounter (Signed)
ATC [facility].  Pharmacy is currently closed at this time.  Will call back.

## 2018-12-17 NOTE — Telephone Encounter (Signed)
Lmtcb for [facility].

## 2019-03-06 IMAGING — US US EXTREM LOW VENOUS BILAT
1 series · 13 of 24 positions shown · non-contrast
Comparison: October 08, 2016

CLINICAL DATA: Two-month history of lower extremity edema
bilaterally

EXAM:
BILATERAL LOWER EXTREMITY VENOUS DUPLEX ULTRASOUND
TECHNIQUE: Gray-scale sonography with graded compression, as well as color
Doppler and duplex ultrasound were performed to evaluate the lower
extremity deep venous systems from the level of the common femoral
vein and including the common femoral, femoral, profunda femoral,
popliteal and calf veins including the posterior tibial, peroneal
and gastrocnemius veins when visible. The superficial great
saphenous vein was also interrogated. Spectral Doppler was utilized
to evaluate flow at rest and with distal augmentation maneuvers in
the common femoral, femoral and popliteal veins.

[Series 1: us extrem low venous bilat · 0.07mm/px · 13 of 61 slices shown]
[im 1/61]
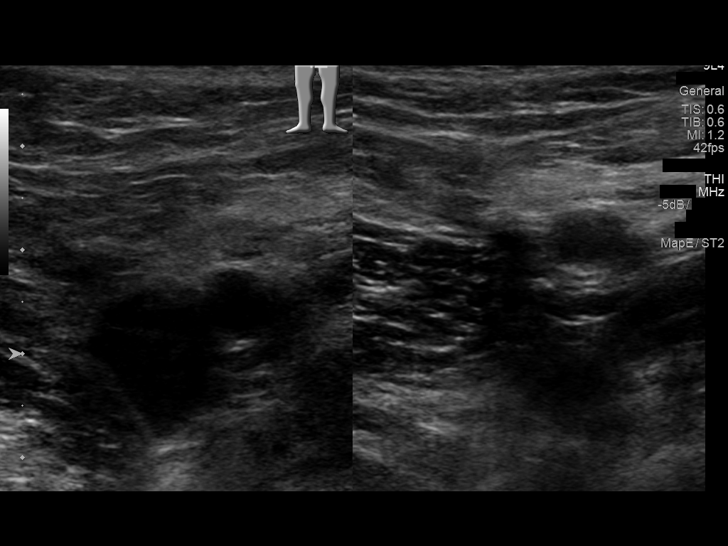
[im 6/61]
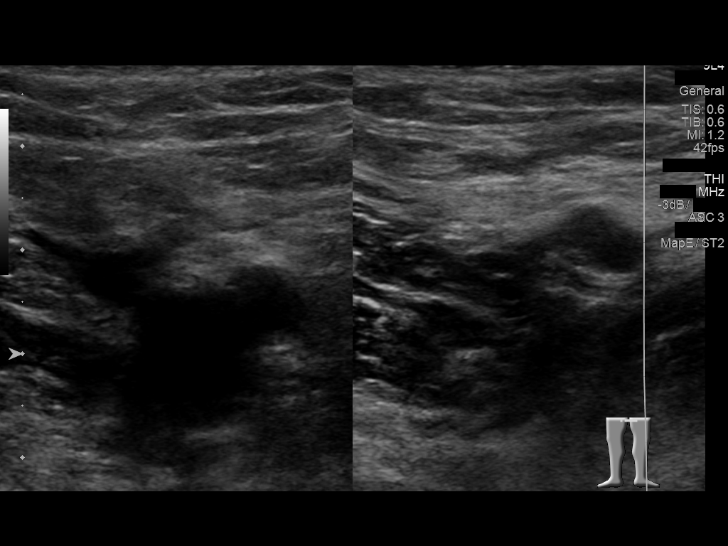
[im 11/61]
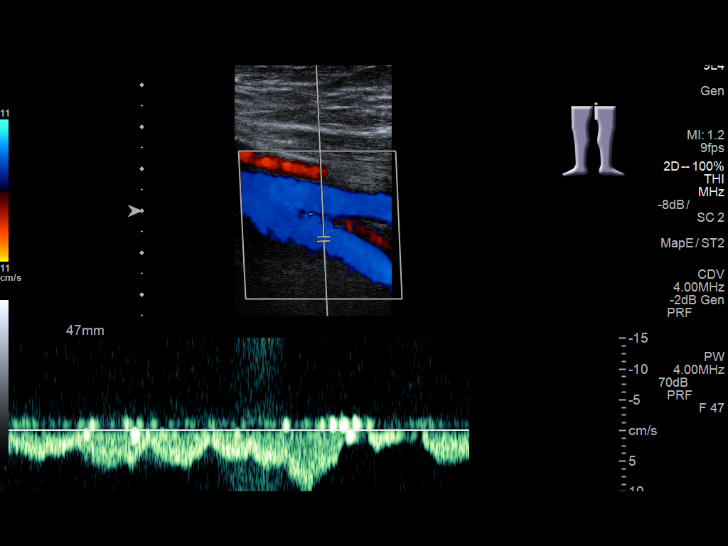
[im 16/61]
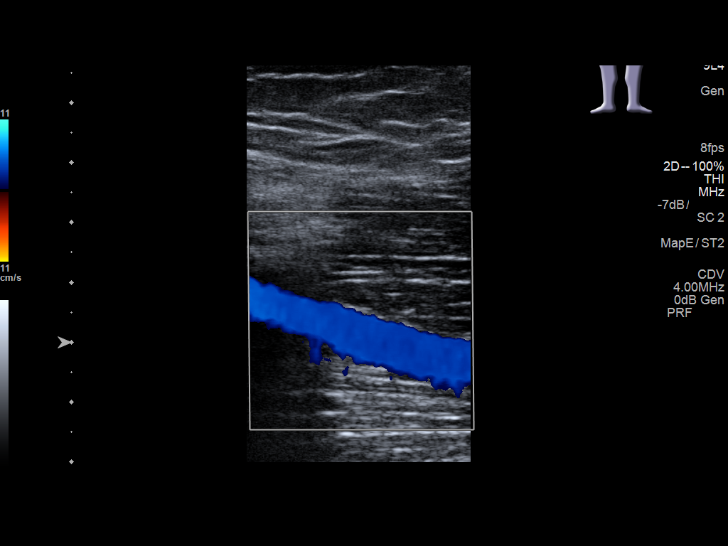
[im 21/61]
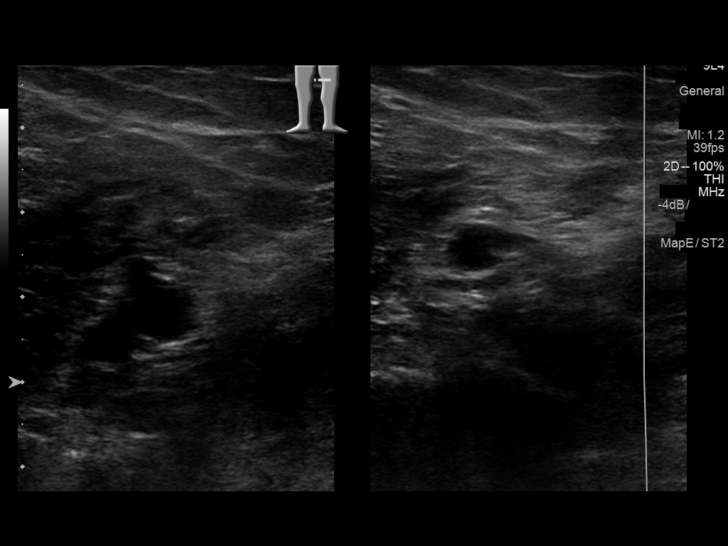
[im 27/61]
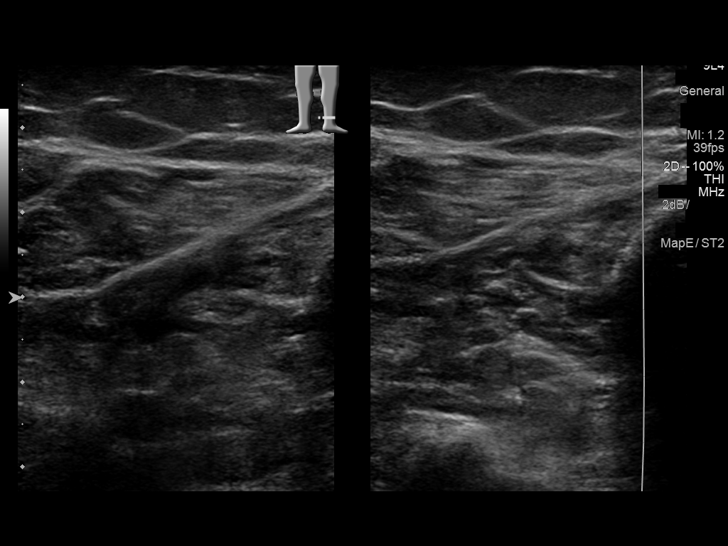
[im 32/61]
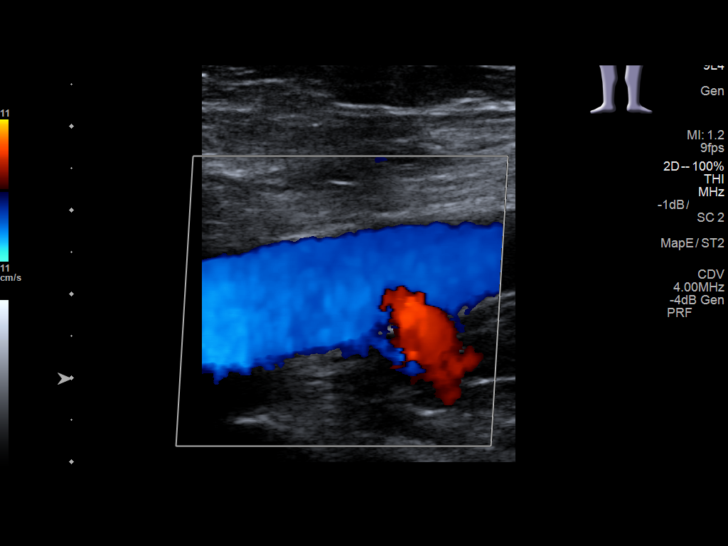
[im 34/61]
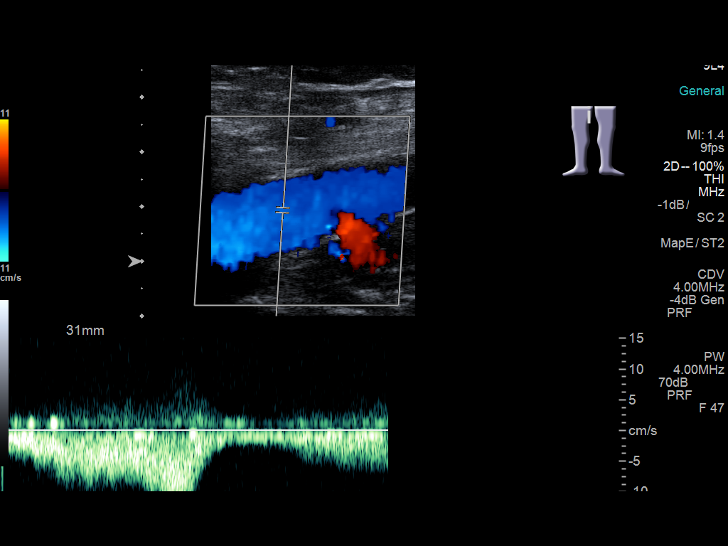
[im 40/61]
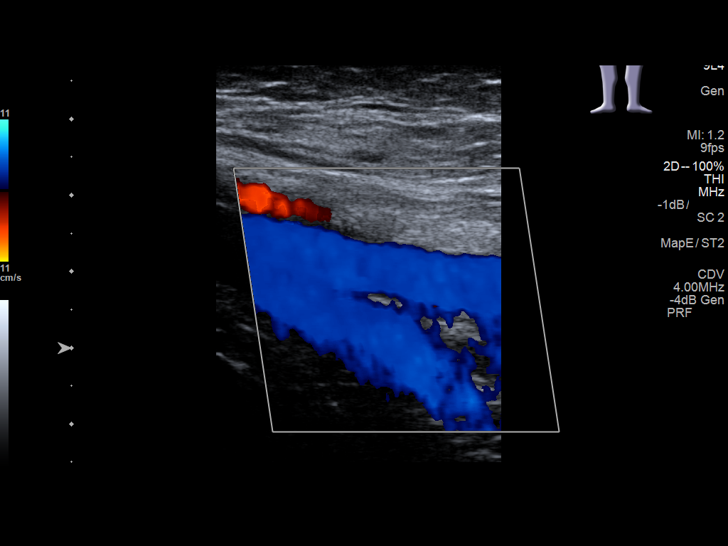
[im 45/61]
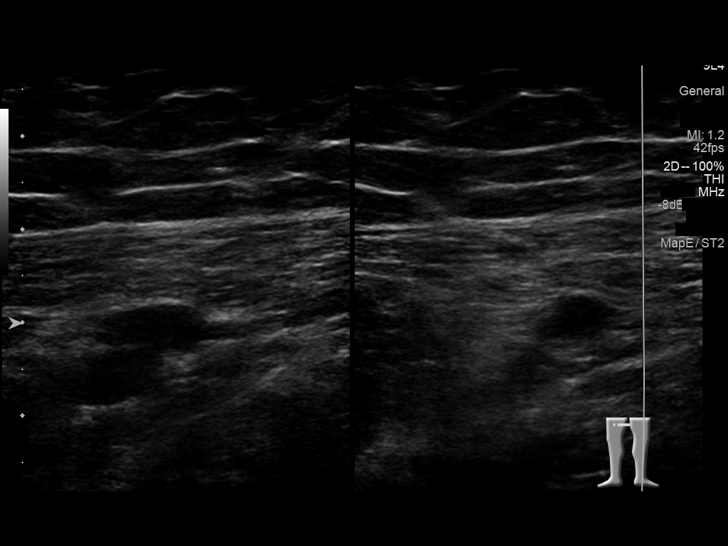
[im 50/61]
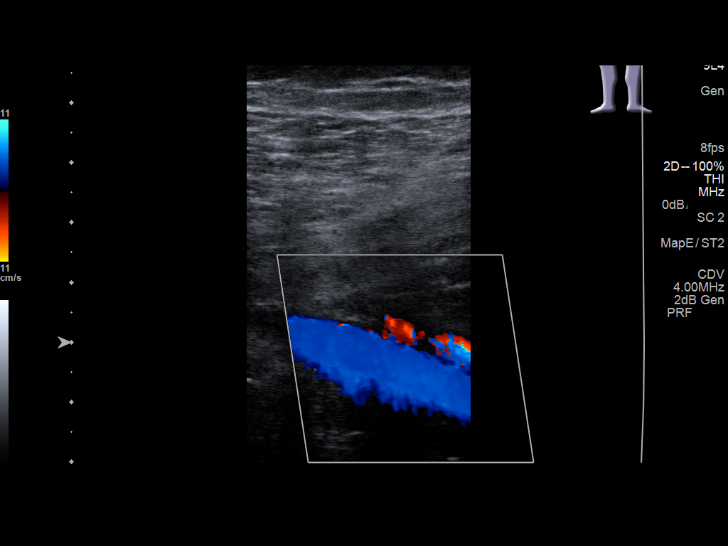
[im 55/61]
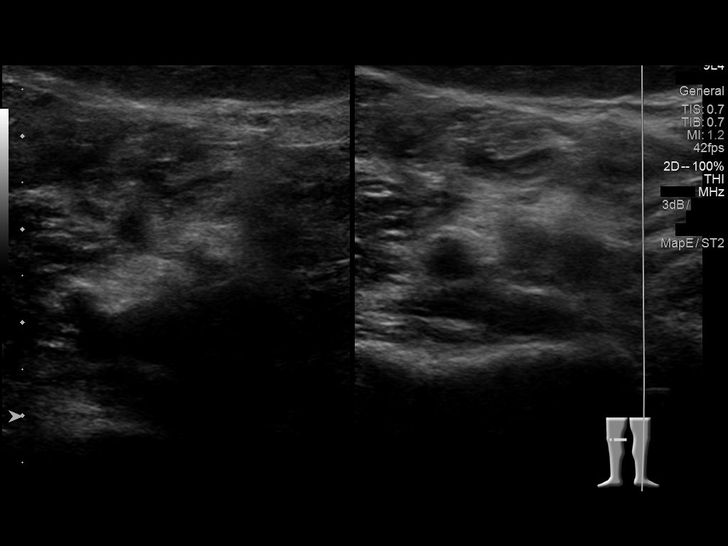
[im 61/61]
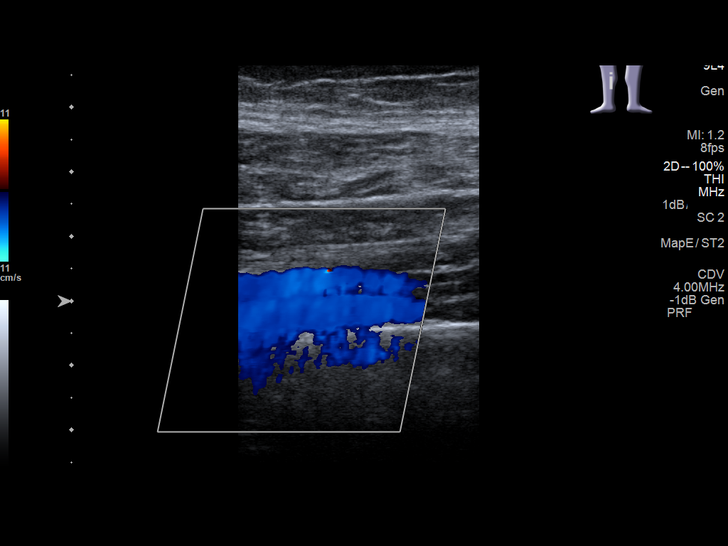

[13 of 24 positions shown; findings below may reference images not displayed]

FINDINGS: RIGHT LOWER EXTREMITY

Common Femoral Vein: No evidence of thrombus. Normal
compressibility, respiratory phasicity and response to augmentation.

Saphenofemoral Junction: No evidence of thrombus. Normal
compressibility and flow on color Doppler imaging.

Profunda Femoral Vein: No evidence of thrombus. Normal
compressibility and flow on color Doppler imaging.

Femoral Vein: No evidence of thrombus. Normal compressibility,
respiratory phasicity and response to augmentation.

Popliteal Vein: No evidence of thrombus. Normal compressibility,
respiratory phasicity and response to augmentation.

Calf Veins: No evidence of thrombus. Normal compressibility and flow
on color Doppler imaging.

Superficial Great Saphenous Vein: No evidence of thrombus. Normal
compressibility and flow on color Doppler imaging.

Venous Reflux:  None.

Other Findings:  None.

LEFT LOWER EXTREMITY

Common Femoral Vein: No evidence of thrombus. Normal
compressibility, respiratory phasicity and response to augmentation.

Saphenofemoral Junction: No evidence of thrombus. Normal
compressibility and flow on color Doppler imaging.

Profunda Femoral Vein: No evidence of thrombus. Normal
compressibility and flow on color Doppler imaging.

Femoral Vein: No evidence of thrombus. Normal compressibility,
respiratory phasicity and response to augmentation.

Popliteal Vein: No evidence of thrombus. Normal compressibility,
respiratory phasicity and response to augmentation.

Calf Veins: No evidence of thrombus. Normal compressibility and flow
on color Doppler imaging.

Superficial Great Saphenous Vein: No evidence of thrombus. Normal
compressibility and flow on color Doppler imaging.

Venous Reflux:  None.

Other Findings:  None.
IMPRESSION: No evidence of deep venous thrombosis in either lower extremity.

## 2019-03-06 IMAGING — US US ABDOMEN COMPLETE
1 series · 14 of 25 positions shown · non-contrast
Comparison: CT 10/24/2016

CLINICAL DATA: Abdominal pain.

EXAM:
ABDOMEN ULTRASOUND COMPLETE

[Series 1: us abdomen complete · 0.19mm/px · 14 of 78 slices shown]
[im 1/78]
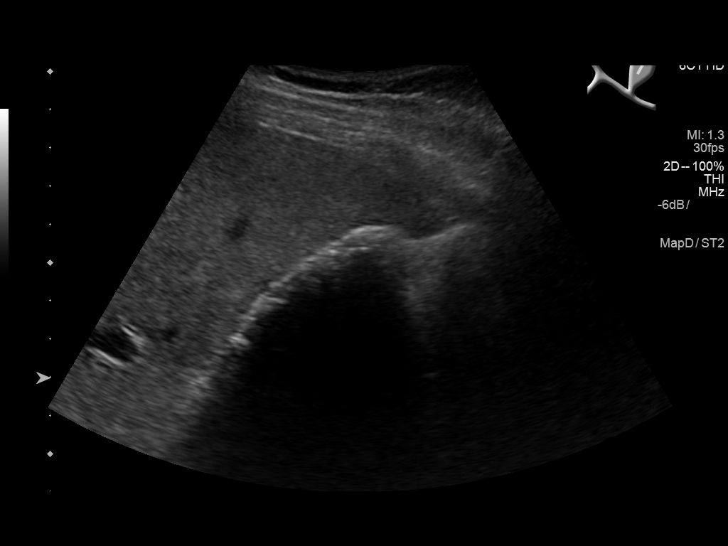
[im 7/78]
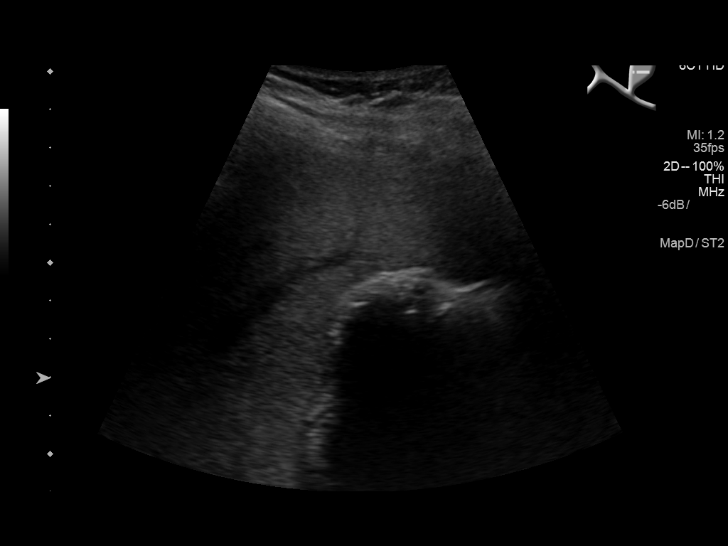
[im 13/78]
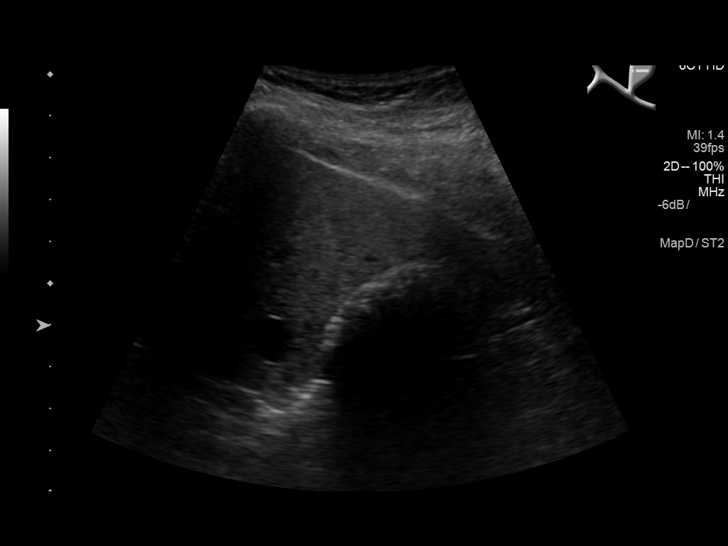
[im 20/78]
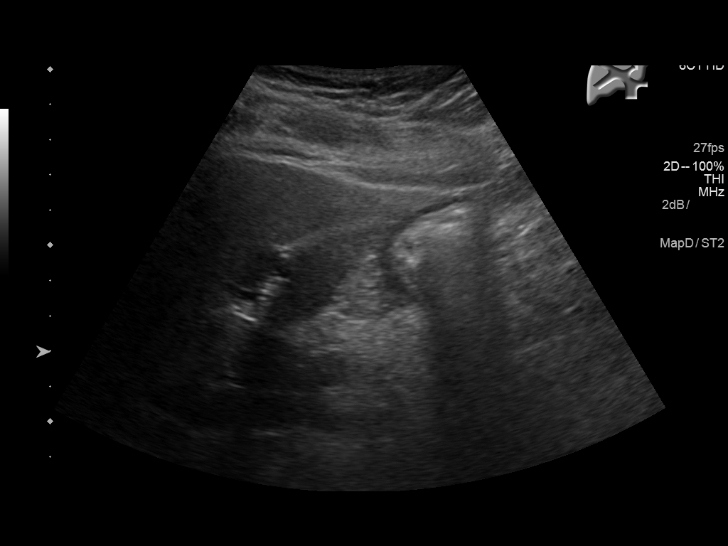
[im 26/78]
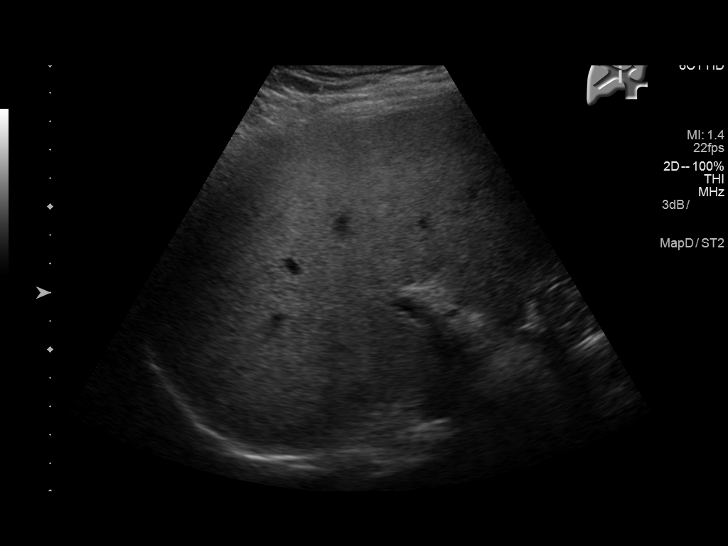
[im 29/78]
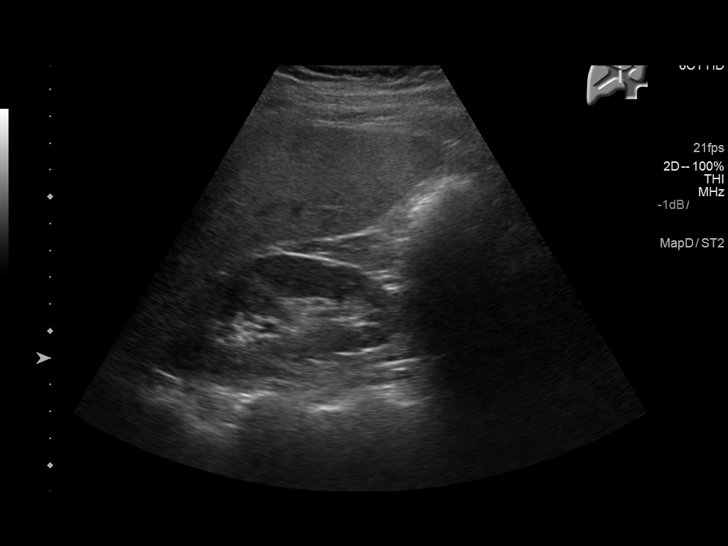
[im 36/78]
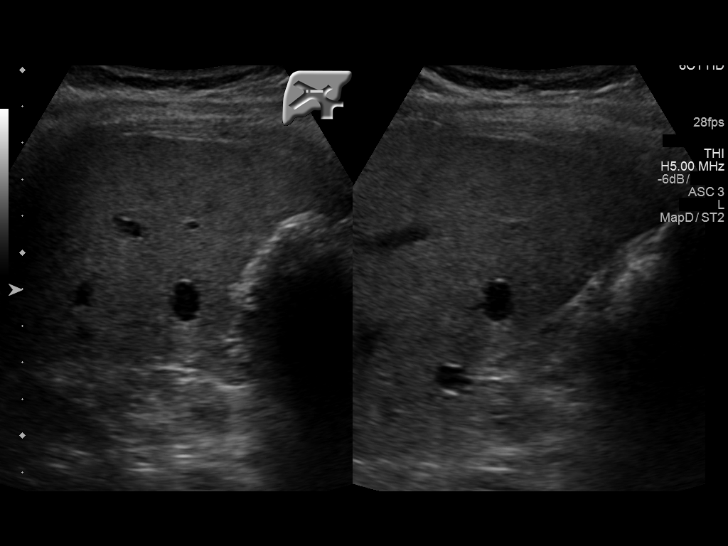
[im 42/78]
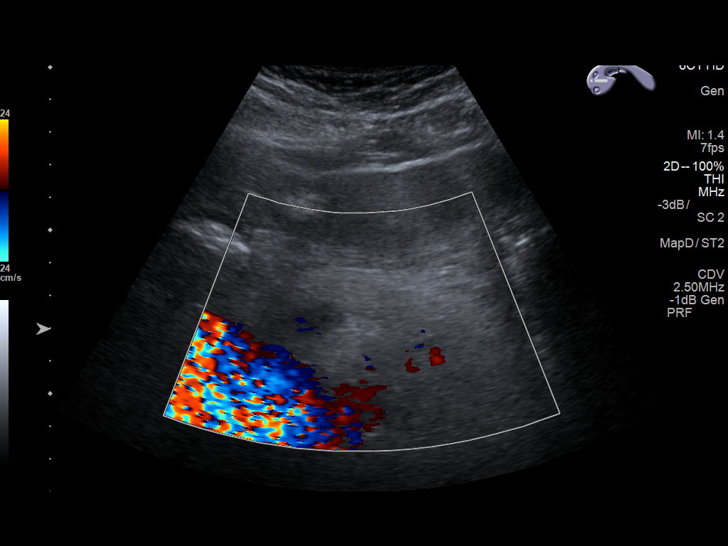
[im 49/78]
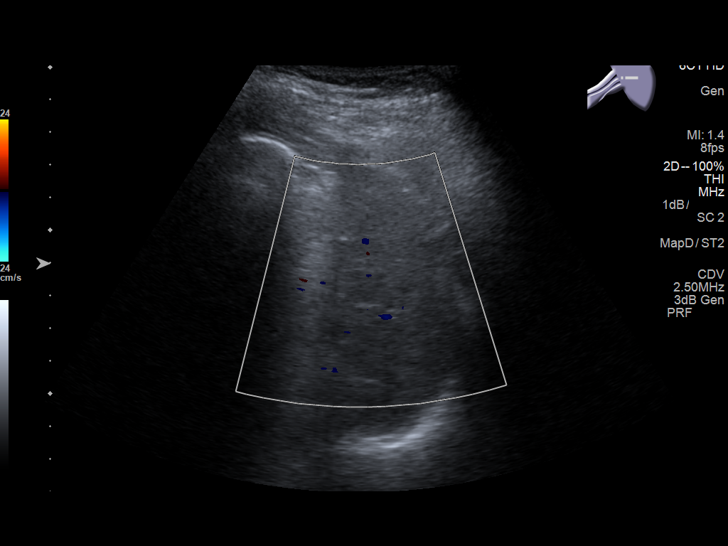
[im 52/78]
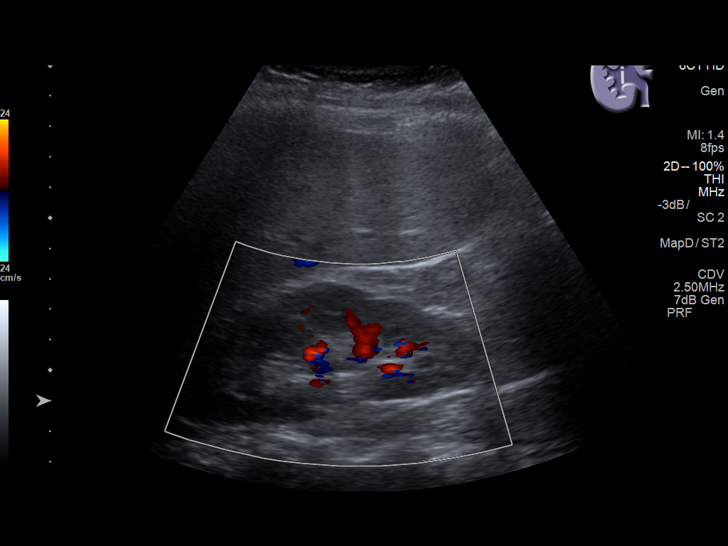
[im 58/78]
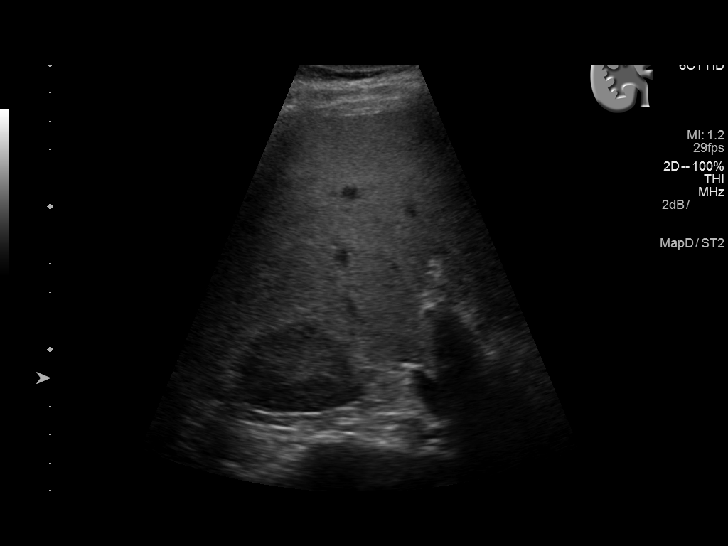
[im 65/78]
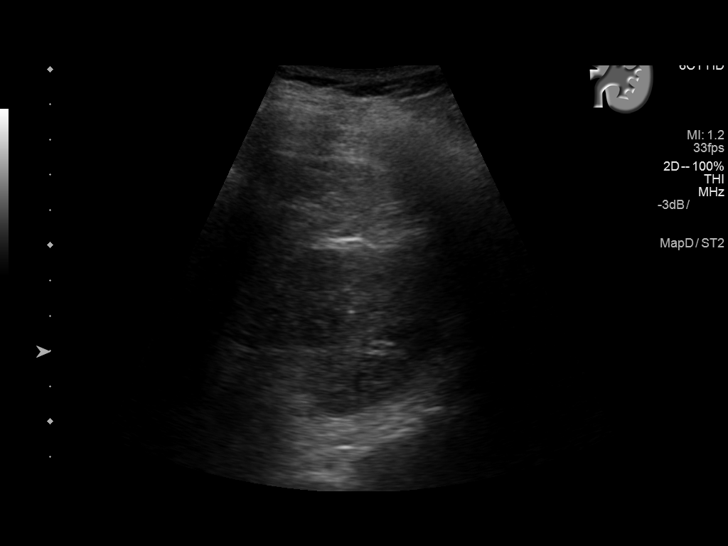
[im 71/78]
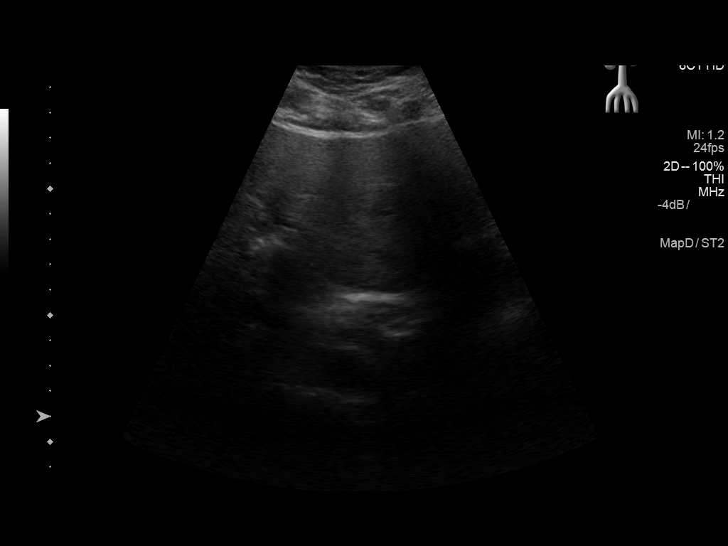
[im 78/78]
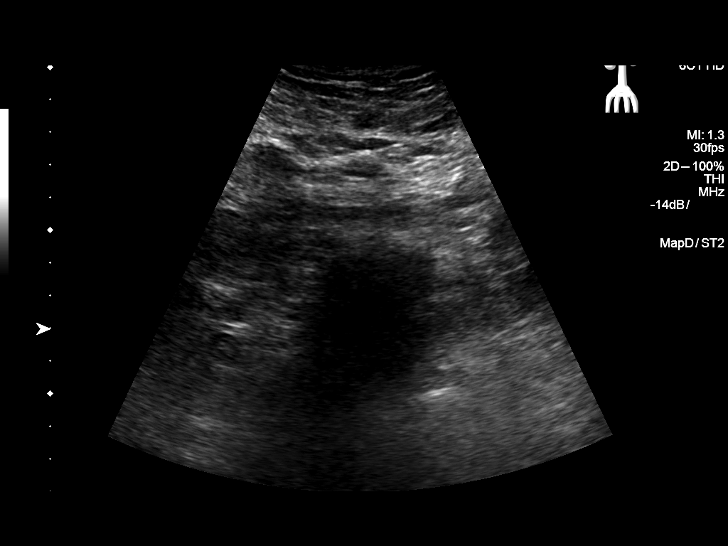

[14 of 25 positions shown; findings below may reference images not displayed]

FINDINGS: Gallbladder: Wall echo shadow complex is identified. This is
compatible with a gallbladder full of stones. Calcifications measure
up to 3 mm. No gallbladder wall thickening. Negative sonographic
Murphy's sign.

Common bile duct: Diameter: 4 mm

Liver: 2 cysts are identified within the right lobe measuring up to
1.4 cm. Diffusely increased parenchymal echogenicity noted
compatible with hepatic steatosis.

IVC: No abnormality visualized.

Pancreas: Visualized portion unremarkable.

Spleen: Size and appearance within normal limits.

Right Kidney: Length: 10.5 cm. Echogenicity within normal limits. No
mass or hydronephrosis visualized.

Left Kidney: Length: 9.8 cm. Echogenicity within normal limits. No
mass or hydronephrosis visualized.

Abdominal aorta: No aneurysm visualized.

Other findings: None.
IMPRESSION: 1. No acute findings.
2. Gallstones.
3. Liver cysts.
4. Hepatic steatosis.

## 2019-05-20 IMAGING — CT CT HEAD WITHOUT CONTRAST
4 series · 16 of 47 positions shown, 18 images · non-contrast
Comparison: October 02, 2018

CLINICAL DATA: Pain after hitting head against door.  Dizziness.

EXAM:
CT HEAD WITHOUT CONTRAST
TECHNIQUE: Contiguous axial images were obtained from the base of the skull
through the vertex without intravenous contrast.

[Series 2: head wo · axial · 0.47mm/px · z∈[-203,-108]mm · 7 of 27 slices shown, 9 images]
[im 4/27  brain]
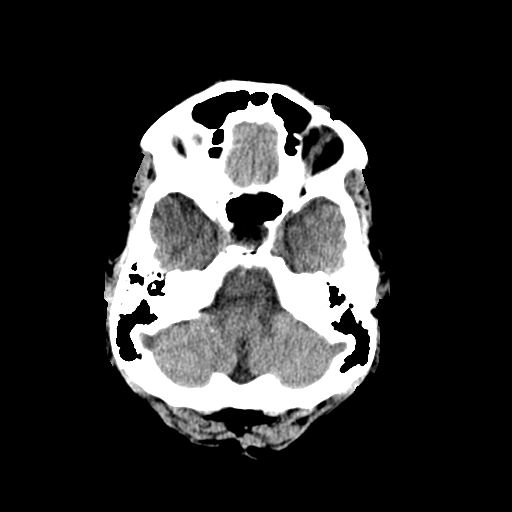
[im 4/27  bone]
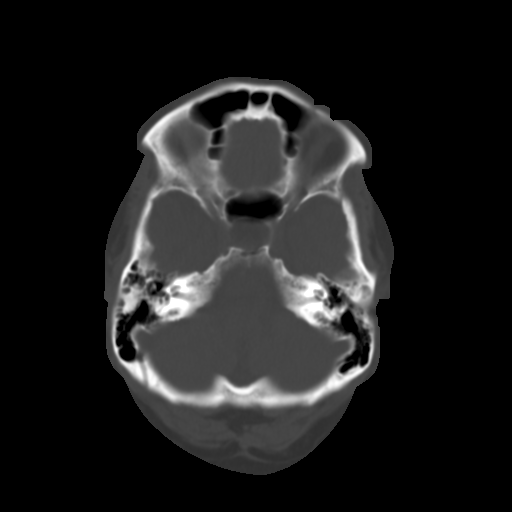
[im 7/27  brain]
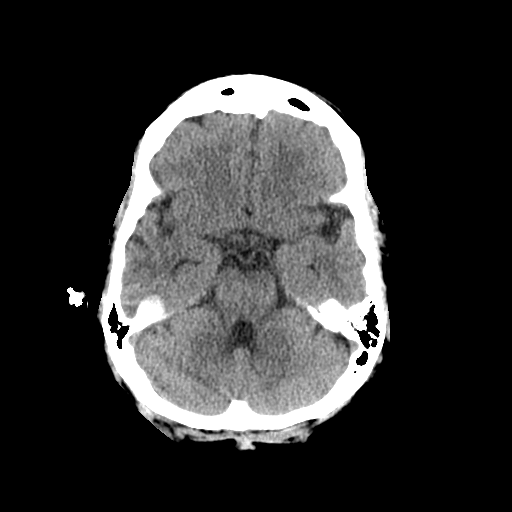
[im 10/27  brain]
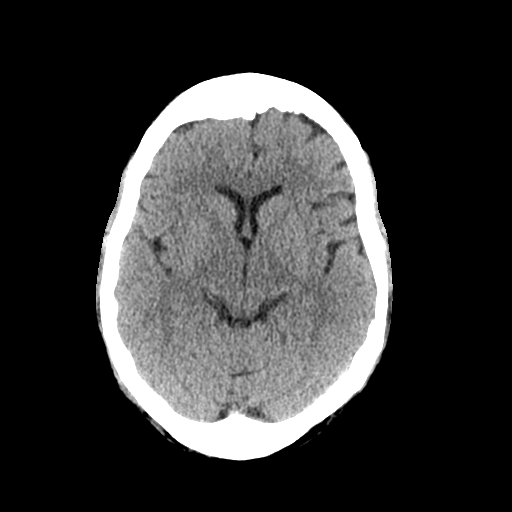
[im 14/27  brain]
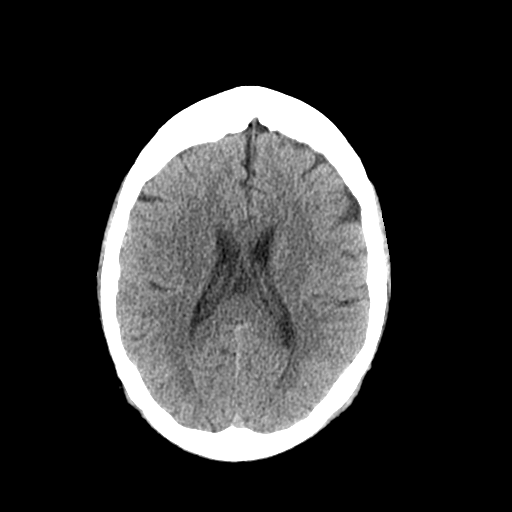
[im 17/27  brain]
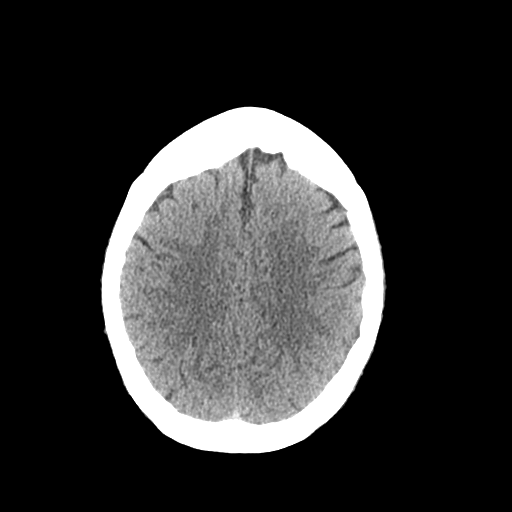
[im 17/27  bone]
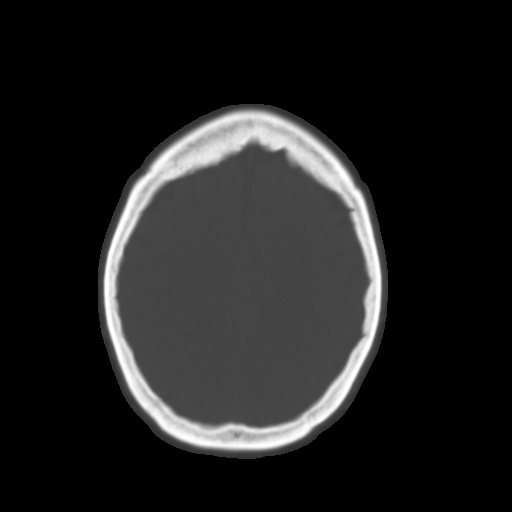
[im 20/27  brain]
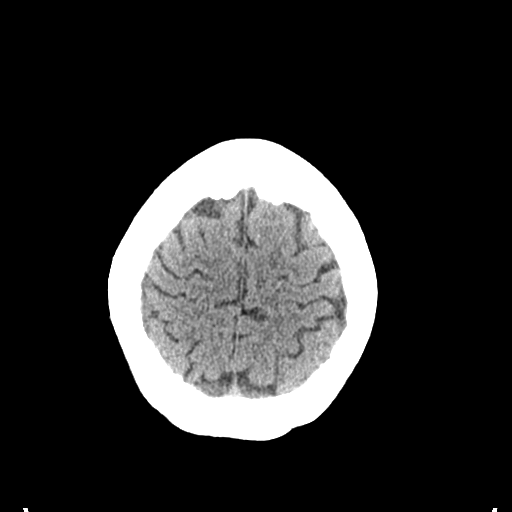
[im 23/27  brain]
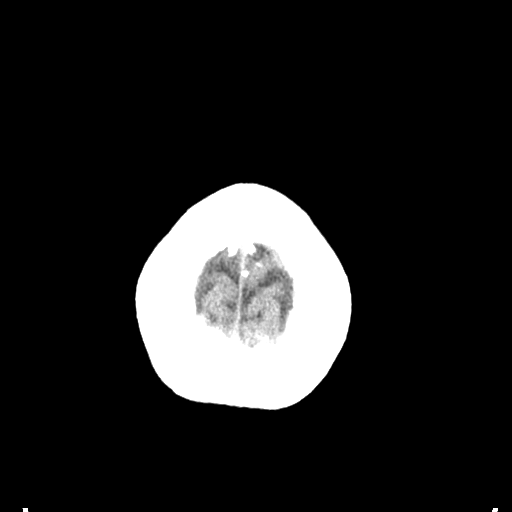

[Series 3: head bone · axial · 0.47mm/px · z∈[-206,-178]mm · 3 of 68 slices shown]
[im 7/68  bone]
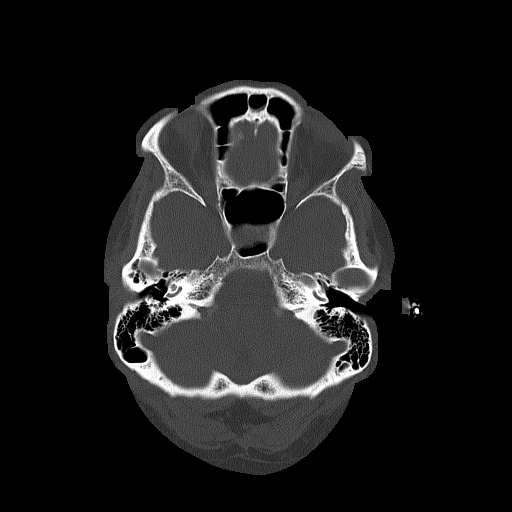
[im 14/68  bone]
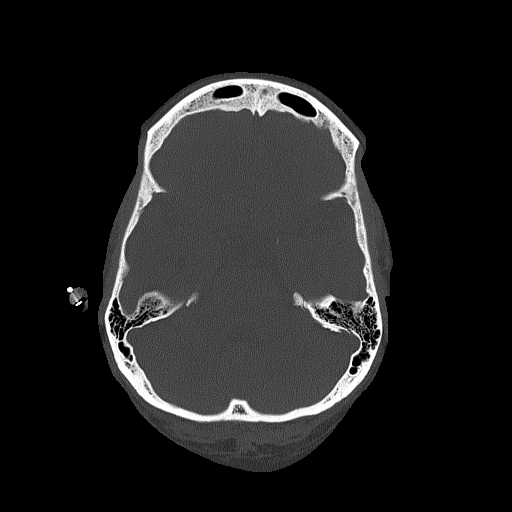
[im 21/68  bone]
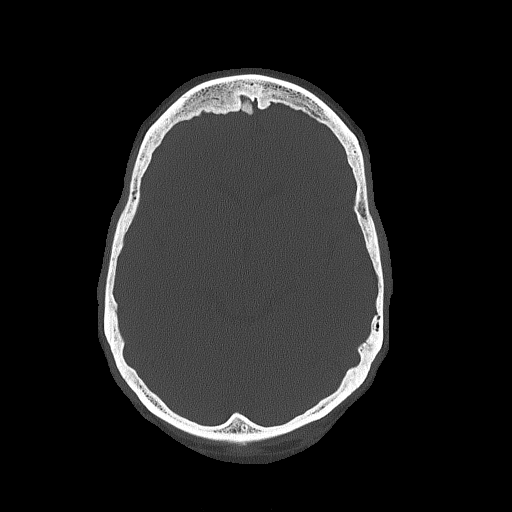

[Series 4: coronal soft tissue · coronal · 0.27mm/px · 3 of 66 slices shown]
[im 22/66  brain]
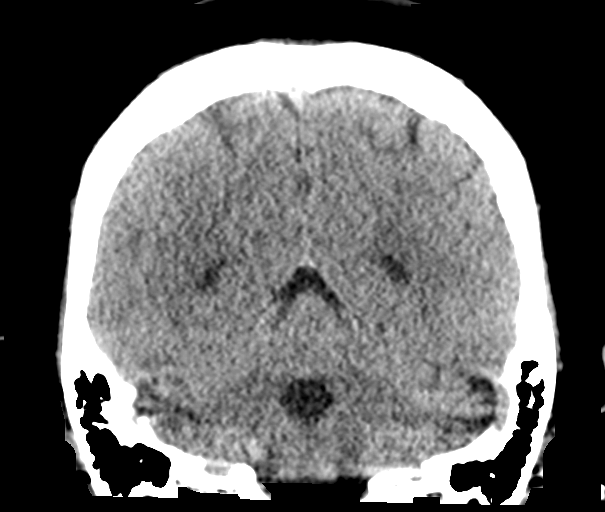
[im 29/66  brain]
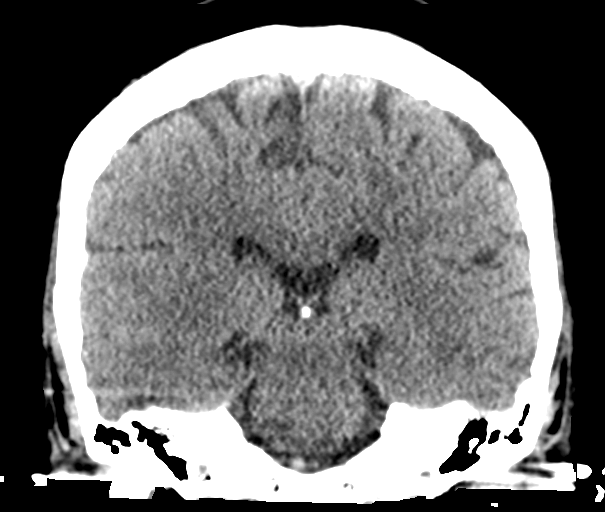
[im 37/66  brain]
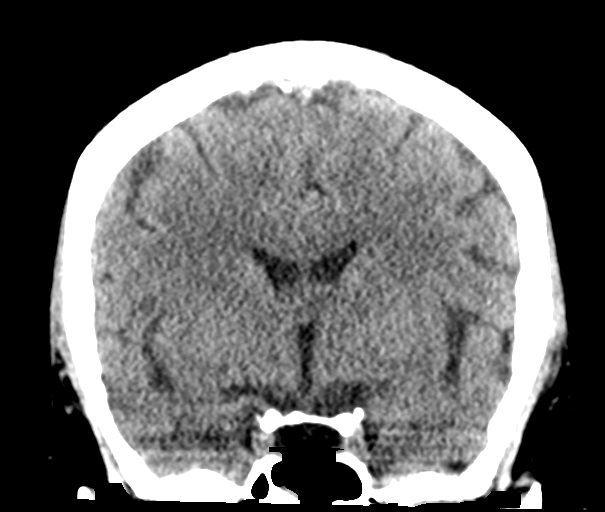

[Series 5: sagittal soft tissue · sagittal · 0.27mm/px · 3 of 55 slices shown]
[im 19/55  brain]
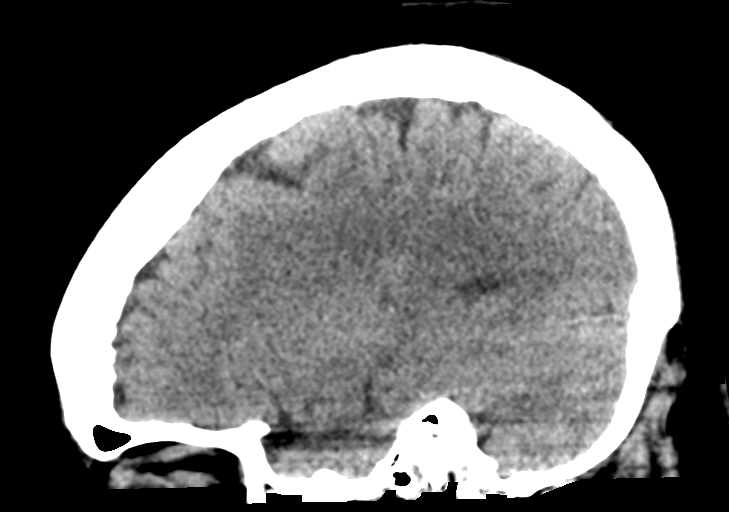
[im 28/55  brain]
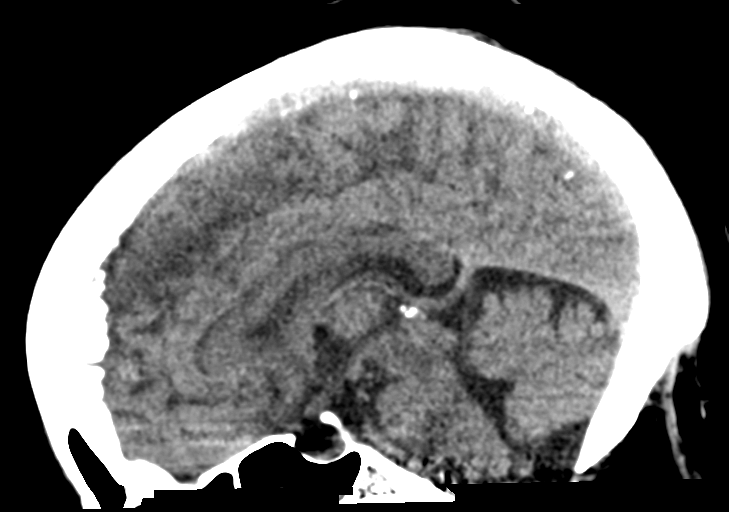
[im 37/55  brain]
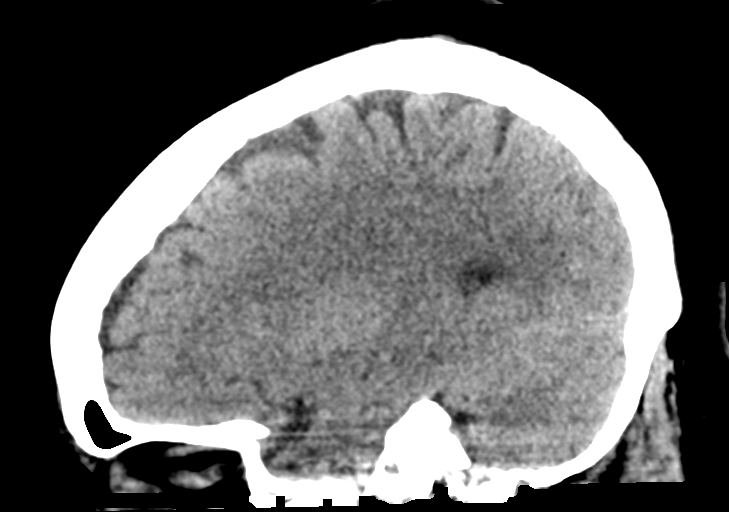

[16 of 47 positions shown; findings below may reference images not displayed]

FINDINGS: Brain: The ventricles are normal in size and configuration. There is
no intracranial mass, hemorrhage, extra-axial fluid collection, or
midline shift. Brain parenchyma appears unremarkable. No acute
infarct evident.

Vascular: No hyperdense vessel. There is no appreciable vascular
calcification.

Skull: The bony calvarium appears intact.

Sinuses/Orbits: There is opacification in portions of the left
sphenoid sinus. There is mucosal thickening in several ethmoid air
cells. Visualized orbits appear symmetric bilaterally.

Other: Visualized mastoid air cells are clear.
IMPRESSION: Brain parenchyma appears unremarkable. No mass or hemorrhage. There
is mild paranasal sinus disease at several sites.

## 2019-06-03 ENCOUNTER — Telehealth: Payer: Self-pay

## 2019-06-03 NOTE — Telephone Encounter (Signed)
Received disability forms. Forms have been faxed to [employer].  

## 2019-07-13 ENCOUNTER — Ambulatory Visit
Admission: RE | Admit: 2019-07-13 | Discharge: 2019-07-13 | Disposition: A | Discharge status: Home / Self Care | Payer: 59 | Source: Ambulatory Visit | Attending: Adult Health | Admitting: Adult Health

## 2019-07-13 ENCOUNTER — Ambulatory Visit: Payer: 59 | Admitting: Adult Health

## 2019-07-13 ENCOUNTER — Ambulatory Visit (INDEPENDENT_AMBULATORY_CARE_PROVIDER_SITE_OTHER): Payer: 59 | Admitting: Adult Health

## 2019-07-13 ENCOUNTER — Other Ambulatory Visit: Payer: Self-pay

## 2019-07-13 ENCOUNTER — Encounter: Payer: Self-pay | Admitting: Adult Health

## 2019-07-13 VITALS — BP 122/74 | HR 76 | Temp 98.1°F | Ht 65.0 in | Wt 205.2 lb

## 2019-07-13 DIAGNOSIS — I5032 Chronic diastolic (congestive) heart failure: Secondary | ICD-10-CM | POA: Diagnosis not present

## 2019-07-13 DIAGNOSIS — G4733 Obstructive sleep apnea (adult) (pediatric): Secondary | ICD-10-CM | POA: Diagnosis not present

## 2019-07-13 DIAGNOSIS — J45909 Unspecified asthma, uncomplicated: Secondary | ICD-10-CM | POA: Insufficient documentation

## 2019-07-13 DIAGNOSIS — J4540 Moderate persistent asthma, uncomplicated: Secondary | ICD-10-CM | POA: Insufficient documentation

## 2019-07-13 MED ORDER — BREO ELLIPTA 200-25 MCG/INH IN AEPB: 1.0000 | INHALATION_SPRAY | Freq: Every day | RESPIRATORY_TRACT | 0 refills | Status: DC

## 2019-07-13 MED ORDER — MONTELUKAST SODIUM 10 MG PO TABS: 10.0000 mg | ORAL_TABLET | Freq: Every day | ORAL | 2 refills | Status: DC

## 2019-07-13 MED ORDER — IPRATROPIUM-ALBUTEROL 0.5-2.5 (3) MG/3ML IN SOLN: 3.0000 mL | Freq: Three times a day (TID) | RESPIRATORY_TRACT | 0 refills | Status: DC | PRN

## 2019-07-13 MED ORDER — SPIRIVA HANDIHALER 18 MCG IN CAPS: 18.0000 ug | ORAL_CAPSULE | Freq: Every day | RESPIRATORY_TRACT | 1 refills | Status: DC

## 2019-07-13 MED ORDER — ALBUTEROL SULFATE HFA 108 (90 BASE) MCG/ACT IN AERS: 2.0000 | INHALATION_SPRAY | RESPIRATORY_TRACT | 1 refills | Status: DC | PRN

## 2019-07-13 NOTE — Assessment & Plan Note (Signed)
Moderate to severe persistent asthma-appears to be stable without exacerbation. Patient does have some ongoing symptoms and was suspected to have possible COVID-19 in July 2020 however unconfirmed. We will check chest x-ray today. Check PFTs on return in 2 to 3 months. Continue on aggressive regimen.  Plan  Patient Instructions  Continue on BREO 1 puff daily  Continue on Spiriva daily .  Continue on Singulair daily  Albuterol Inhaler or Duoneb every 4-6 hr as needed wheezing /shortness of breath .  Chest xray today .  Restart CPAP At bedtime   Wear CPAP for at least 4 hr each night  Follow up with Dr. Mortimer Fries in 2-3 months with PFT .  Please contact office for sooner follow up if symptoms do not improve or worsen or seek emergency care

## 2019-07-13 NOTE — Assessment & Plan Note (Signed)
Appears compensated without evidence of volume overload on exam.  Continue on current regimen 

## 2019-07-13 NOTE — Progress Notes (Signed)
@Patient  ID: , female    DOB: 30-Sep-1965, 53 y.o.   MRN: [id number]  Chief Complaint  Patient presents with  . Follow-up    Asthma     Referring provider: [id number], MD  HPI: 53 year old female never smoker  followed for severe persistent asthma and obstructive sleep apnea Medical history significant for hypertension, diabetes, congestive heart failure, TIA  TEST/EVENTS :  RAST test 04/11/2016 total IgE 9.  Mild allergy to cockroach.  07/28/2018: Review outside sleep study 07/09/2018, CPAP titration study, patient required CPAP at a pressure of 10.  Residual AHI 0.   07/13/2019 Follow up : Asthma  And OSA  Patient returns for a 1 year follow-up.  Patient has moderate to severe asthma.  Has been maintained on Breo, Spiriva, and Singulair.  Says was doing okay but had intermittent cough and wheezing on occasion.  However Developed symptoms of COVID 19 in July 2020 , did not seek medical attention.  Developed cough, congestion fever loss of taste and smell.  In August had worsening cough , confusion , dyspnea.  Was admitted to [facility] for hypoxia and delirium.[facility]  COVID 19 test was negative.  Chest x-ray showed minimal left basilar and left retrocardiac opacities.  She was treated for decompensated CHF with Lasix.  Discharge summary noted may have prolonged COVID-19 symptoms although she has not had a positive COVID-19 test.  She did have some possible seizure activity however was unconfirmed.  EEG showed diffuse slowing. Patient says she still continues to have some weakness and shortness of breath.  She has loss of taste and smell. She says that her breathing is doing about the same.  She gets short of breath with activity.  She denies any significant cough or wheezing.  Does use her DuoNeb on a regular basis.  Has obstructive sleep apnea.  Says she is only wearing her CPAP about once or twice a month.  Is encouraged to wear her CPAP each night.  Patient education  given  Allergies  Allergen Reactions  . Penicillins Anaphylaxis    Has patient had a PCN reaction causing immediate rash, facial/tongue/throat swelling, SOB or lightheadedness with hypotension: Yes Has patient had a PCN reaction causing severe rash involving mucus membranes or skin necrosis: No Has patient had a PCN reaction that required hospitalization No Has patient had a PCN reaction occurring within the last 10 years: No If all of the above answers are "NO", then may proceed with Cephalosporin use.   . Ace Inhibitors Swelling    Immunization History  Administered Date(s) Administered  . Influenza,inj,Quad PF,6+ Mos 06/05/2016, 06/05/2017, 06/16/2018  . Pneumococcal Polysaccharide-23 03/20/2016, 06/05/2017    Past Medical History:  Diagnosis Date  . Arthritis    knees, shoulder, upper back  . Asthma   . CHF (congestive heart failure) (HCC)   . Diabetes mellitus without complication (HCC)   . Environmental allergies   . Fibromyalgia   . GERD (gastroesophageal reflux disease)   . Headache    migraines - 5x/mo  . Hypertension   . Hypokalemia 10/14/2017  . Migraines   . Motion sickness    ships  . Sleep apnea   . Stroke [facility])    no residual deficits  . Thyroid nodule    bilateral and goiter  . Wears contact lenses   . Wears dentures    partial upper    Tobacco History: Social History   Tobacco Use  Smoking Status Never Smoker  Smokeless  Tobacco Never Used   Counseling given: Not Answered   Outpatient Medications Prior to Visit  Medication Sig Dispense Refill  . amLODipine (NORVASC) 5 MG tablet Take 1 tablet (5 mg total) by mouth daily. 30 tablet 0  . ARIPiprazole (ABILIFY) 2 MG tablet Take 2 mg by mouth daily.     . Ascorbic Acid (VITAMIN C) 1000 MG tablet Take 500 mg by mouth 2 (two) times daily.     . canagliflozin (INVOKANA) 300 MG TABS tablet Take 300 mg by mouth daily before breakfast.    . DULoxetine (CYMBALTA) 60 MG capsule Take 60 mg by mouth 2  (two) times daily.    . flunisolide (NASALIDE) 25 MCG/ACT (0.025%) SOLN Place 2 sprays into the nose daily as needed.    . furosemide (LASIX) 40 MG tablet     . gabapentin (NEURONTIN) 300 MG capsule Take 600 mg by mouth 2 (two) times daily.    . hydrocortisone (ANUSOL-HC) 25 MG suppository Place 1 suppository (25 mg total) rectally 2 (two) times daily. 30 suppository 0  . insulin detemir (LEVEMIR) 100 UNIT/ML injection Inject 45 Units into the skin daily.     . isosorbide mononitrate (IMDUR) 30 MG 24 hr tablet Take 1 tablet (30 mg total) by mouth daily.    [name] Kitchen LINZESS 290 MCG CAPS capsule Take 1 tablet by mouth daily before breakfast.     . losartan (COZAAR) 100 MG tablet Take 100 mg by mouth daily.    . mirtazapine (REMERON SOL-TAB) 30 MG disintegrating tablet Take 30 mg by mouth daily.   2  . Multiple Vitamin (MULTIVITAMIN) tablet Take 1 tablet by mouth daily.    [name] Kitchen omeprazole (PRILOSEC) 40 MG capsule Take 1 capsule (40 mg total) by mouth daily. 1 hr before supper 30 capsule 0  . Potassium Chloride ER 20 MEQ TBCR Take 20 mEq by mouth daily. Take with lasix    . pregabalin (LYRICA) 150 MG capsule Take 150 mg by mouth 3 (three) times daily.    [name] Kitchen senna-docusate (SENOKOT-S) 8.6-50 MG tablet Take 1 tablet by mouth 2 (two) times daily. 30 tablet 0  . sitaGLIPtin (JANUVIA) 100 MG tablet Take 100 mg by mouth daily.    . sucralfate (CARAFATE) 1 g tablet Take 1 g by mouth 2 (two) times daily.     . SUMAtriptan (IMITREX) 25 MG tablet Take 25 mg by mouth every 2 (two) hours as needed for migraine. May repeat in 2 hours if headache persists or recurs.    [name] Kitchen tiZANidine (ZANAFLEX) 4 MG tablet Take 4 mg by mouth every 8 (eight) hours as needed for muscle spasms.     [name] Kitchen zolpidem (AMBIEN) 10 MG tablet Take 10 mg by mouth at bedtime as needed for sleep.     [name] Kitchen albuterol (PROVENTIL HFA) 108 (90 Base) MCG/ACT inhaler Inhale 2 puffs into the lungs every 4 (four) hours as needed for wheezing or shortness of breath. 3  Inhaler 1  . ipratropium-albuterol (DUONEB) 0.5-2.5 (3) MG/3ML SOLN Inhale 3 mLs into the lungs 3 (three) times daily as needed (shortness of breath, wheezing or cough). 360 mL 0  . clonazePAM (KLONOPIN) 0.5 MG tablet Take 0.5 mg by mouth 2 (two) times daily.    . montelukast (SINGULAIR) 10 MG tablet Take 1 tablet (10 mg total) by mouth daily. 30 tablet 1  . pravastatin (PRAVACHOL) 80 MG tablet Take 1 tablet (80 mg total) by mouth daily. 30 tablet 0  . tiotropium (SPIRIVA HANDIHALER) 18 MCG  inhalation capsule Place 1 capsule (18 mcg total) into inhaler and inhale daily. 90 capsule 1   No facility-administered medications prior to visit.      Review of Systems:   Constitutional:   No  weight loss, night sweats,  Fevers, chills,  +fatigue, or  lassitude.  HEENT:   No headaches,  Difficulty swallowing,  Tooth/dental problems, or  Sore throat,                No sneezing, itching, ear ache, nasal congestion, post nasal drip,   CV:  No chest pain,  Orthopnea, PND, swelling in lower extremities, anasarca, dizziness, palpitations, syncope.   GI  No heartburn, indigestion, abdominal pain, nausea, vomiting, diarrhea, change in bowel habits, loss of appetite, bloody stools.   Resp: .  No chest wall deformity   Skin: no rash or lesions.  GU: no dysuria, change in color of urine, no urgency or frequency.  No flank pain, no hematuria   MS:  No joint pain or swelling.  No decreased range of motion.  No back pain.    Physical Exam  BP 122/74 (BP Location: Left Arm, Cuff Size: Normal)   Pulse 76   Temp 98.1 F (36.7 C) (Temporal)   Ht  (1.651 m)   Wt 205 lb 3.2 oz (93.1 kg)   SpO2 99%   BMI 34.15 kg/m   GEN: A/Ox3; pleasant , NAD, obese per BMI   HEENT:  [place]/AT, NOSE-clear, THROAT-clear, no lesions, no postnasal drip or exudate noted.   NECK:  Supple w/ fair ROM; no JVD; normal carotid impulses w/o bruits; no thyromegaly or nodules palpated; no lymphadenopathy.    RESP  Clear  P  & A; w/o, wheezes/ rales/ or rhonchi. no accessory muscle use, no dullness to percussion  CARD:  RRR, no m/r/g, trace peripheral edema, pulses intact, no cyanosis or clubbing.  GI:   Soft & nt; nml bowel sounds; no organomegaly or masses detected.   Musco: Warm bil, no deformities or joint swelling noted.   Neuro: alert, no focal deficits noted.    Skin: Warm, no lesions or rashes    Lab Results:  CBC   BMET  ProBNP No results found for: PROBNP  Imaging: No results found.    PFT Results Latest Ref Rng & Units 05/23/2016  FVC-Pre L 1.73  FVC-Predicted Pre % 58  FVC-Post L 2.24  FVC-Predicted Post % 75  Pre FEV1/FVC % % 91  Post FEV1/FCV % % 92  FEV1-Pre L 1.57  FEV1-Predicted Pre % 65  FEV1-Post L 2.07  DLCO UNC% % 85  DLCO COR %Predicted % 142    No results found for: NITRICOXIDE      Assessment & Plan:   Asthma Moderate to severe persistent asthma-appears to be stable without exacerbation. Patient does have some ongoing symptoms and was suspected to have possible COVID-19 in July 2020 however unconfirmed. We will check chest x-ray today. Check PFTs on return in 2 to 3 months. Continue on aggressive regimen.  Plan  Patient Instructions  Continue on BREO 1 puff daily  Continue on Spiriva daily .  Continue on Singulair daily  Albuterol Inhaler or Duoneb every 4-6 hr as needed wheezing /shortness of breath .  Chest xray today .  Restart CPAP At bedtime   Wear CPAP for at least 4 hr each night  Follow up with Dr. Belia Heman in 2-3 months with PFT .  Please contact office for sooner follow up if symptoms  do not improve or worsen or seek emergency care        Sleep apnea Obstructive sleep apnea.  Patient is to restart her CPAP.  Encouraged to wear at least 4 hours each night.  Plan  Patient Instructions  Continue on BREO 1 puff daily  Continue on Spiriva daily .  Continue on Singulair daily  Albuterol Inhaler or Duoneb every 4-6 hr as needed  wheezing /shortness of breath .  Chest xray today .  Restart CPAP At bedtime   Wear CPAP for at least 4 hr each night  Follow up with Dr. Belia HemanKasa in 2-3 months with PFT .  Please contact office for sooner follow up if symptoms do not improve or worsen or seek emergency care        Chronic diastolic heart failure (HCC) Appears compensated without evidence of volume overload on exam.  Continue on current regimen     Amanda Oaksammy Anushree Dorsi, NP 07/13/2019

## 2019-07-13 NOTE — Patient Instructions (Addendum)
Continue on BREO 1 puff daily  Continue on Spiriva daily .  Continue on Singulair daily  Albuterol Inhaler or Duoneb every 4-6 hr as needed wheezing /shortness of breath .  Chest xray today .  Restart CPAP At bedtime   Wear CPAP for at least 4 hr each night  Follow up with Dr. Mortimer Fries in 2-3 months with PFT .  Please contact office for sooner follow up if symptoms do not improve or worsen or seek emergency care

## 2019-07-13 NOTE — Assessment & Plan Note (Signed)
Obstructive sleep apnea.  Patient is to restart her CPAP.  Encouraged to wear at least 4 hours each night.  Plan  Patient Instructions  Continue on BREO 1 puff daily  Continue on Spiriva daily .  Continue on Singulair daily  Albuterol Inhaler or Duoneb every 4-6 hr as needed wheezing /shortness of breath .  Chest xray today .  Restart CPAP At bedtime   Wear CPAP for at least 4 hr each night  Follow up with Dr. Mortimer Fries in 2-3 months with PFT .  Please contact office for sooner follow up if symptoms do not improve or worsen or seek emergency care

## 2019-07-14 ENCOUNTER — Telehealth: Payer: Self-pay | Admitting: Adult Health

## 2019-07-14 MED ORDER — BREO ELLIPTA 200-25 MCG/INH IN AEPB: 1.0000 | INHALATION_SPRAY | Freq: Every day | RESPIRATORY_TRACT | 2 refills | Status: DC

## 2019-07-14 NOTE — Telephone Encounter (Signed)
Rx for Breo 200 has been corrected.  Left detailed message with [facility] to make them aware. Nothing further is needed.

## 2019-07-14 NOTE — Addendum Note (Signed)
Addended by: Maryanna Shape A on: 07/14/2019 09:18 AM   Modules accepted: Orders

## 2019-07-15 ENCOUNTER — Telehealth: Payer: Self-pay | Admitting: Pulmonary Disease

## 2019-07-15 DIAGNOSIS — J4540 Moderate persistent asthma, uncomplicated: Secondary | ICD-10-CM

## 2019-07-15 NOTE — Telephone Encounter (Signed)
Patient states would like the [employer] Respimat.  Pharmacy is [facility].  Patient phone number is [phone].

## 2019-07-15 NOTE — Telephone Encounter (Signed)
Lungs are clear cont w/ ov recs  Please contact office for sooner follow up if symptoms do not improve or worsen or seek emergency care   Pt is aware of above results and voiced her understanding.  Pt is also requesting Rx for portable nebulizer to be sent to [place].    DK please advise if okay to order.

## 2019-07-15 NOTE — Telephone Encounter (Signed)
DK please advise if okay to order Proair respimat. Pt currently on Ventolin.

## 2019-07-15 NOTE — Telephone Encounter (Signed)
Order has been placed to [facility] for portable nebulizer machine.  Left message to make pt aware.

## 2019-07-15 NOTE — Telephone Encounter (Signed)
Yes it is ok

## 2019-07-15 NOTE — Telephone Encounter (Signed)
yes

## 2019-07-16 MED ORDER — PROAIR RESPICLICK 108 (90 BASE) MCG/ACT IN AEPB: 2.0000 | INHALATION_SPRAY | Freq: Four times a day (QID) | RESPIRATORY_TRACT | 1 refills | Status: DC | PRN

## 2019-07-16 NOTE — Telephone Encounter (Signed)
Pt returning call.  [phone].

## 2019-07-16 NOTE — Telephone Encounter (Signed)
Rx for proair respiclick has been sent to preferred pharmacy.   pt is aware and voiced her understanding.  Nothing further is needed.

## 2019-07-16 NOTE — Telephone Encounter (Signed)
Lm for pt

## 2019-08-05 ENCOUNTER — Other Ambulatory Visit
Admission: RE | Admit: 2019-08-05 | Discharge: 2019-08-05 | Disposition: A | Discharge status: Home / Self Care | Payer: 59 | Source: Ambulatory Visit | Attending: Gastroenterology | Admitting: Gastroenterology

## 2019-08-05 ENCOUNTER — Other Ambulatory Visit: Payer: Self-pay

## 2019-08-05 ENCOUNTER — Encounter: Payer: Self-pay | Admitting: Gastroenterology

## 2019-08-05 ENCOUNTER — Ambulatory Visit (INDEPENDENT_AMBULATORY_CARE_PROVIDER_SITE_OTHER): Payer: 59 | Admitting: Gastroenterology

## 2019-08-05 VITALS — BP 157/100 | HR 81 | Temp 98.7°F | Wt 207.2 lb

## 2019-08-05 DIAGNOSIS — K760 Fatty (change of) liver, not elsewhere classified: Secondary | ICD-10-CM | POA: Diagnosis not present

## 2019-08-05 DIAGNOSIS — Z23 Encounter for immunization: Secondary | ICD-10-CM | POA: Diagnosis not present

## 2019-08-05 DIAGNOSIS — R7989 Other specified abnormal findings of blood chemistry: Secondary | ICD-10-CM | POA: Insufficient documentation

## 2019-08-05 DIAGNOSIS — Z789 Other specified health status: Secondary | ICD-10-CM | POA: Diagnosis not present

## 2019-08-05 LAB — HEMOGLOBIN A1C
Hgb A1c MFr Bld: 11.7 % — ABNORMAL HIGH (ref 4.8–5.6)
Mean Plasma Glucose: 289.09 mg/dL

## 2019-08-05 LAB — VITAMIN D 25 HYDROXY (VIT D DEFICIENCY, FRACTURES): Vit D, 25-Hydroxy: 11.4 ng/mL — ABNORMAL LOW (ref 30–100)

## 2019-08-05 LAB — IRON AND TIBC
Iron: 87 ug/dL (ref 28–170)
Saturation Ratios: 21 % (ref 10.4–31.8)
TIBC: 419 ug/dL (ref 250–450)
UIBC: 332 ug/dL

## 2019-08-05 LAB — FERRITIN: Ferritin: 32 ng/mL (ref 11–307)

## 2019-08-05 NOTE — Progress Notes (Signed)
Amanda Darby, MD [address]  [place]  [place], [place] [address]  Main: [phone]  Fax: [phone]    Gastroenterology Consultation  Referring Provider:     Donnie Coffin, MD Primary Care Physician:  Donnie Coffin, MD Primary Gastroenterologist:  Dr. Cephas [name] Reason for Consultation: Elevated LFTs, fatty liver        HPI:   Amanda [name] is a 53 y.o. female referred by Dr. Donnie Coffin, MD  for consultation & management of elevated LFTs, fatty liver.  Patient has history of metabolic syndrome, poorly controlled diabetes who is referred due to elevated LFTs.  She has history of cholecystectomy in 04/2017.  Labs from care [facility] at [facility] revealed elevated transaminases AST 78, ALT 97 in 04/2019.  Last normal in 11/2018.  She had repeat LFTs acute on 07/05/2019 which revealed mildly elevated alkaline phosphatase 111, AST and ALT were normal.  She underwent right upper quadrant ultrasound in 09/2018 which revealed diffuse hepatic steatosis, also confirmed on the CT abdomen pelvis without contrast in 10/2018.  Patient reports that she has been gaining weight.  She stays at home, currently on disability, tends to snack frequently, particularly in the evening.  She used to work as an Optometrist  She does not smoke or drink alcohol.  Denies IV drug abuse, had tattoos in the past.  Denies blood transfusion in the past.  She did not work in [employer] She denies family history of liver disease Denies use of herbal supplements, heavy NSAID use  She does have intermittent rectal bleeding, associated with constipation.  She underwent ligation of the right posterior hemorrhoid in 10/2018  NSAIDs: None  Antiplts/Anticoagulants/Anti thrombotics: None  GI Procedures: Colonoscopy 10/06/2018 - Preparation of the colon was fair. - Hemorrhoids found on perianal exam. - Stool in the entire examined colon. - Non-bleeding external and internal hemorrhoids, source of rectal bleeding -  No specimens collected.  Colonoscopy by Dr. Vira Agar in 03/2017 One diminutive polyp in the sigmoid colon, removed with a jumbo cold forceps. Resected and retrieved. - Internal hemorrhoids. - The examination was otherwise normal.  EGD by Dr. Vira Agar 03/2017 - Normal esophagus. - Gastritis. Biopsied. - Normal examined duodenum.  DIAGNOSIS:  A. STOMACH, ANTRUM; COLD BIOPSY:  - MODERATE CHRONIC ACTIVE GASTRITIS, HELICOBACTER PYLORI ASSOCIATED.  - NEGATIVE FOR DYSPLASIA AND MALIGNANCY.   B. COLON POLYP, SIGMOID; COLD BIOPSY:  - HYPERPLASTIC EPITHELIAL CHANGE.  - NEGATIVE FOR DYSPLASIA AND MALIGNANCY.    Past Medical History:  Diagnosis Date  . Arthritis    knees, shoulder, upper back  . Asthma   . CHF (congestive heart failure) ([place])   . Diabetes mellitus without complication ([place])   . Environmental allergies   . Fibromyalgia   . GERD (gastroesophageal reflux disease)   . Headache    migraines - 5x/mo  . Hypertension   . Hypokalemia 10/14/2017  . Migraines   . Motion sickness    ships  . Sleep apnea   . Stroke [facility])    no residual deficits  . Thyroid nodule    bilateral and goiter  . Wears contact lenses   . Wears dentures    partial upper    Past Surgical History:  Procedure Laterality Date  . ABDOMINAL HYSTERECTOMY  2000's  . ABDOMINAL SURGERY     laparoscopy x4 with lysis of adhesions  . APPENDECTOMY  1986  . CESAREAN SECTION  1992  . CHOLECYSTECTOMY N/A 04/03/2017   Procedure: LAPAROSCOPIC  CHOLECYSTECTOMY;  Surgeon: Henrene DodgePiscoya, Jose, MD;  Location: [facility] ORS;  Service: General;  Laterality: N/A;  . COLONOSCOPY N/A 10/06/2018   Procedure: COLONOSCOPY;  Surgeon: Toney ReilVanga, Kanesha Cadle Reddy, MD;  [facility];  Service: Gastroenterology;  Laterality: N/A;  . COLONOSCOPY WITH PROPOFOL N/A 03/10/2017   Procedure: COLONOSCOPY WITH PROPOFOL;  Surgeon: Scot JunElliott, Robert T, MD;  [facility];  Service: Endoscopy;  Laterality: N/A;  . ECTOPIC PREGNANCY SURGERY   2000's  . ESOPHAGOGASTRODUODENOSCOPY (EGD) WITH PROPOFOL N/A 03/10/2017   Procedure: ESOPHAGOGASTRODUODENOSCOPY (EGD) WITH PROPOFOL;  Surgeon: Scot JunElliott, Robert T, MD;  [facility];  Service: Endoscopy;  Laterality: N/A;  . HERNIA REPAIR    . JOINT REPLACEMENT Right 12/16/12   knee- medial - makoplasty  . KNEE ARTHROSCOPY Right 2006   partial medial and lateral meniscectomies  . KNEE ARTHROSCOPY Right 10/06/2015   Procedure: RIGHT KNEE ARTHROSCOPY WITH DEBRIDEMENT;  Surgeon: Erin SonsHarold Kernodle, MD;  [facility];  Service: Orthopedics;  Laterality: Right;  Diabetic - insulin and oral meds  . ROTATOR CUFF REPAIR Left 2006  . TUBAL LIGATION  2000's    Current Outpatient Medications:  .  albuterol (PROVENTIL HFA) 108 (90 Base) MCG/ACT inhaler, Inhale 2 puffs into the lungs every 4 (four) hours as needed for wheezing or shortness of breath., Disp: 18 g, Rfl: 1 .  Ascorbic Acid (VITAMIN C) 1000 MG tablet, Take 500 mg by mouth 2 (two) times daily. , Disp: , Rfl:  .  DULoxetine (CYMBALTA) 60 MG capsule, Take 60 mg by mouth 2 (two) times daily., Disp: , Rfl:  .  flunisolide (NASALIDE) 25 MCG/ACT (0.025%) SOLN, Place 2 sprays into the nose daily as needed., Disp: , Rfl:  .  fluticasone furoate-vilanterol (BREO ELLIPTA) 200-25 MCG/INH AEPB, Inhale 1 puff into the lungs daily. 1 puff daily., Disp: 60 each, Rfl: 2 .  furosemide (LASIX) 40 MG tablet, , Disp: , Rfl:  .  gabapentin (NEURONTIN) 300 MG capsule, Take 600 mg by mouth 2 (two) times daily., Disp: , Rfl:  .  insulin detemir (LEVEMIR) 100 UNIT/ML injection, Inject 45 Units into the skin daily. , Disp: , Rfl:  .  insulin lispro (HUMALOG KWIKPEN) 100 UNIT/ML KwikPen, Inject into the skin., Disp: , Rfl:  .  ipratropium-albuterol (DUONEB) 0.5-2.5 (3) MG/3ML SOLN, Inhale 3 mLs into the lungs 3 (three) times daily as needed (shortness of breath, wheezing or cough)., Disp: 360 mL, Rfl: 0 .  isosorbide mononitrate (IMDUR) 30 MG 24 hr  tablet, Take 1 tablet (30 mg total) by mouth daily., Disp: , Rfl:  .  LINZESS 290 MCG CAPS capsule, Take 1 tablet by mouth daily before breakfast. , Disp: , Rfl:  .  losartan (COZAAR) 100 MG tablet, Take 100 mg by mouth daily., Disp: , Rfl:  .  metoprolol succinate (TOPROL-XL) 100 MG 24 hr tablet, Take by mouth., Disp: , Rfl:  .  mirtazapine (REMERON SOL-TAB) 30 MG disintegrating tablet, Take 30 mg by mouth daily. , Disp: , Rfl: 2 .  montelukast (SINGULAIR) 10 MG tablet, Take 1 tablet (10 mg total) by mouth at bedtime., Disp: 30 tablet, Rfl: 2 .  Multiple Vitamin (MULTIVITAMIN) tablet, Take 1 tablet by mouth daily., Disp: , Rfl:  .  omeprazole (PRILOSEC) 40 MG capsule, Take 1 capsule (40 mg total) by mouth daily. 1 hr before supper, Disp: 30 capsule, Rfl: 0 .  Potassium Chloride ER 20 MEQ TBCR, Take 20 mEq by mouth daily. Take with lasix, Disp: , Rfl:  .  potassium chloride SA (KLOR-CON) 20 MEQ tablet, Take by mouth., Disp: , Rfl:  .  pravastatin (PRAVACHOL) 80 MG tablet, Take 1 tablet (80 mg total) by mouth daily., Disp: 30 tablet, Rfl: 0 .  senna-docusate (SENOKOT-S) 8.6-50 MG tablet, Take 1 tablet by mouth 2 (two) times daily., Disp: 30 tablet, Rfl: 0 .  sitaGLIPtin (JANUVIA) 100 MG tablet, Take 100 mg by mouth daily., Disp: , Rfl:  .  SUMAtriptan (IMITREX) 25 MG tablet, Take 25 mg by mouth every 2 (two) hours as needed for migraine. May repeat in 2 hours if headache persists or recurs., Disp: , Rfl:  .  tiotropium (SPIRIVA HANDIHALER) 18 MCG inhalation capsule, Place 1 capsule (18 mcg total) into inhaler and inhale daily., Disp: 90 capsule, Rfl: 1 .  tiZANidine (ZANAFLEX) 4 MG tablet, Take 4 mg by mouth every 8 (eight) hours as needed for muscle spasms. , Disp: , Rfl:  .  topiramate (TOPAMAX) 50 MG tablet, Take by mouth., Disp: , Rfl:  .  zolpidem (AMBIEN) 10 MG tablet, Take 10 mg by mouth at bedtime as needed for sleep. , Disp: , Rfl:    Family History  Problem Relation Age of Onset  .  Hypertension Mother   . Hypertension Father   . Diabetes Father   . Allergies Father   . Kidney disease Father   . Breast cancer Neg Hx      Social History   Tobacco Use  . Smoking status: Never Smoker  . Smokeless tobacco: Never Used  Substance Use Topics  . Alcohol use: No  . Drug use: No    Allergies as of 08/05/2019 - Review Complete 08/05/2019  Allergen Reaction Noted  . Penicillins Anaphylaxis 05/28/2015  . Ace inhibitors Swelling 05/28/2015    Review of Systems:    All systems reviewed and negative except where noted in HPI.   Physical Exam:  BP (!) 157/100 (BP Location: Left Arm, Patient Position: Sitting, Cuff Size: Normal)   Pulse 81   Temp 98.7 F (37.1 C) (Oral)   Wt 207 lb 4 oz (94 kg)   BMI 34.49 kg/m  No LMP recorded. Patient has had a hysterectomy.  General:   Alert,  Well-developed, well-nourished, pleasant and cooperative in NAD Head:  Normocephalic and atraumatic. Eyes:  Sclera clear, no icterus.   Conjunctiva pink. Ears:  Normal auditory acuity. Nose:  No deformity, discharge, or lesions. Mouth:  No deformity or lesions,oropharynx pink & moist. Neck:  Supple; no masses or thyromegaly. Lungs:  Respirations even and unlabored.  Clear throughout to auscultation.   No wheezes, crackles, or rhonchi. No acute distress. Heart:  Regular rate and rhythm; no murmurs, clicks, rubs, or gallops. Abdomen:  Normal bowel sounds. Soft, non-tender and non-distended without masses, hepatosplenomegaly or hernias noted.  No guarding or rebound tenderness.   Rectal: Not performed Msk:  Symmetrical without gross deformities. Good, equal movement & strength bilaterally. Pulses:  Normal pulses noted. Extremities:  No clubbing or edema.  No cyanosis. Neurologic:  Alert and oriented x3;  grossly normal neurologically. Skin:  Intact without significant lesions or rashes. No jaundice. Psych:  Alert and cooperative. Normal mood and affect.  Imaging  Studies: Reviewed  Assessment and Plan:   Amanda [name] is a 53 y.o. female with metabolic syndrome, fatty liver, elevated LFTs, chronic constipation with rectal bleeding secondary to hemorrhoids, history of H. pylori infection in 2018  Elevated LFTs, most likely secondary to nonalcoholic fatty liver disease Acute viral hepatitis panel is  negative Perform secondary liver disease work-up [facility]/counseling Recommend tight control of diabetes  History of H. pylori gastritis Recommend H. pylori breath test to confirm eradication  Chronic constipation Currently on Linzess 290 MCG daily Recommended her to avoid snacking Increase to drinking the water, high-fiber diet and fiber supplements  Rectal bleeding, secondary to hemorrhoids Defer hemorrhoid ligation today   Follow up in 3 months   Arlyss Repress, MD

## 2019-08-05 NOTE — Addendum Note (Signed)
Addended by: Cephas Darby on: 08/05/2019 02:01 PM   Modules accepted: Level of Service

## 2019-08-06 LAB — ANA: Anti Nuclear Antibody (ANA): NEGATIVE

## 2019-08-06 LAB — CERULOPLASMIN: Ceruloplasmin: 32.6 mg/dL (ref 19.0–39.0)

## 2019-08-06 LAB — H. PYLORI BREATH TEST: H. pylori UBiT: NEGATIVE

## 2019-08-06 LAB — ANTI-SMOOTH MUSCLE ANTIBODY, IGG: F-Actin IgG: 4 Units (ref 0–19)

## 2019-08-06 LAB — MITOCHONDRIAL ANTIBODIES: Mitochondrial M2 Ab, IgG: <20 Units (ref 0.0–20.0)

## 2019-08-06 LAB — ANTI-MICROSOMAL ANTIBODY LIVER / KIDNEY: LKM1 Ab: 1.4 Units (ref 0.0–20.0)

## 2019-08-09 ENCOUNTER — Telehealth: Payer: Self-pay

## 2019-08-09 LAB — ALPHA-1 ANTITRYPSIN PHENOTYPE: A-1 Antitrypsin, Ser: 125 mg/dL (ref 101–187)

## 2019-08-09 MED ORDER — VITAMIN D (ERGOCALCIFEROL) 1.25 MG (50000 UNIT) PO CAPS: 50000.0000 [IU] | ORAL_CAPSULE | ORAL | 0 refills | Status: DC

## 2019-08-09 NOTE — Telephone Encounter (Signed)
-----   Message from Lin Landsman, MD sent at 08/08/2019 10:51 PM EST ----- HbA1C is extremely high, fax a copy of results to her PCP and contact his office ASAP Send Vit D 50K weekly for 12 weeks Rest of the labs for elevated liver enzymes came back normal   Rohini Vanga

## 2019-08-09 NOTE — Telephone Encounter (Signed)
Patient verbalized understanding of lab results. Patient states she will contact pcp office. I sent medication to the pharmacy. Faxed blood work to PCP and also called them to let them know about a1c

## 2019-08-11 ENCOUNTER — Other Ambulatory Visit: Payer: Self-pay | Admitting: Family Medicine

## 2019-08-11 DIAGNOSIS — Z1231 Encounter for screening mammogram for malignant neoplasm of breast: Secondary | ICD-10-CM

## 2019-08-17 ENCOUNTER — Ambulatory Visit
Admission: RE | Admit: 2019-08-17 | Discharge: 2019-08-17 | Disposition: A | Discharge status: Home / Self Care | Payer: 59 | Source: Ambulatory Visit | Attending: Family Medicine | Admitting: Family Medicine

## 2019-08-17 DIAGNOSIS — Z1231 Encounter for screening mammogram for malignant neoplasm of breast: Secondary | ICD-10-CM | POA: Diagnosis not present

## 2019-08-24 ENCOUNTER — Encounter: Payer: Self-pay | Admitting: Family

## 2019-08-24 ENCOUNTER — Other Ambulatory Visit: Payer: Self-pay

## 2019-08-24 ENCOUNTER — Ambulatory Visit: Payer: 59 | Attending: Family | Admitting: Family

## 2019-08-24 VITALS — BP 150/107 | HR 108 | Resp 20 | Ht 65.0 in | Wt 209.0 lb

## 2019-08-24 DIAGNOSIS — I110 Hypertensive heart disease with heart failure: Secondary | ICD-10-CM | POA: Diagnosis not present

## 2019-08-24 DIAGNOSIS — I5033 Acute on chronic diastolic (congestive) heart failure: Secondary | ICD-10-CM | POA: Insufficient documentation

## 2019-08-24 DIAGNOSIS — M797 Fibromyalgia: Secondary | ICD-10-CM | POA: Insufficient documentation

## 2019-08-24 DIAGNOSIS — E119 Type 2 diabetes mellitus without complications: Secondary | ICD-10-CM | POA: Insufficient documentation

## 2019-08-24 DIAGNOSIS — E079 Disorder of thyroid, unspecified: Secondary | ICD-10-CM | POA: Insufficient documentation

## 2019-08-24 DIAGNOSIS — M1990 Unspecified osteoarthritis, unspecified site: Secondary | ICD-10-CM | POA: Insufficient documentation

## 2019-08-24 DIAGNOSIS — Z79899 Other long term (current) drug therapy: Secondary | ICD-10-CM | POA: Insufficient documentation

## 2019-08-24 DIAGNOSIS — Z8249 Family history of ischemic heart disease and other diseases of the circulatory system: Secondary | ICD-10-CM | POA: Insufficient documentation

## 2019-08-24 DIAGNOSIS — J45909 Unspecified asthma, uncomplicated: Secondary | ICD-10-CM | POA: Insufficient documentation

## 2019-08-24 DIAGNOSIS — I10 Essential (primary) hypertension: Secondary | ICD-10-CM

## 2019-08-24 DIAGNOSIS — Z8673 Personal history of transient ischemic attack (TIA), and cerebral infarction without residual deficits: Secondary | ICD-10-CM | POA: Diagnosis not present

## 2019-08-24 DIAGNOSIS — G4733 Obstructive sleep apnea (adult) (pediatric): Secondary | ICD-10-CM | POA: Diagnosis not present

## 2019-08-24 DIAGNOSIS — Z880 Allergy status to penicillin: Secondary | ICD-10-CM | POA: Insufficient documentation

## 2019-08-24 DIAGNOSIS — Z888 Allergy status to other drugs, medicaments and biological substances status: Secondary | ICD-10-CM | POA: Insufficient documentation

## 2019-08-24 DIAGNOSIS — K219 Gastro-esophageal reflux disease without esophagitis: Secondary | ICD-10-CM | POA: Insufficient documentation

## 2019-08-24 DIAGNOSIS — Z841 Family history of disorders of kidney and ureter: Secondary | ICD-10-CM | POA: Diagnosis not present

## 2019-08-24 DIAGNOSIS — Z833 Family history of diabetes mellitus: Secondary | ICD-10-CM | POA: Insufficient documentation

## 2019-08-24 DIAGNOSIS — Z7951 Long term (current) use of inhaled steroids: Secondary | ICD-10-CM | POA: Diagnosis not present

## 2019-08-24 DIAGNOSIS — Z7984 Long term (current) use of oral hypoglycemic drugs: Secondary | ICD-10-CM | POA: Diagnosis not present

## 2019-08-24 DIAGNOSIS — K760 Fatty (change of) liver, not elsewhere classified: Secondary | ICD-10-CM | POA: Insufficient documentation

## 2019-08-24 DIAGNOSIS — I509 Heart failure, unspecified: Secondary | ICD-10-CM | POA: Diagnosis present

## 2019-08-24 DIAGNOSIS — Z794 Long term (current) use of insulin: Secondary | ICD-10-CM

## 2019-08-24 MED ORDER — FUROSEMIDE 20 MG PO TABS: 20.0000 mg | ORAL_TABLET | Freq: Two times a day (BID) | ORAL | 0 refills | Status: DC

## 2019-08-24 MED ORDER — POTASSIUM CHLORIDE CRYS ER 20 MEQ PO TBCR: 20.0000 meq | EXTENDED_RELEASE_TABLET | Freq: Every day | ORAL | 0 refills | Status: DC

## 2019-08-24 NOTE — Progress Notes (Signed)
Patient ID: Amanda [name], female    DOB: 1966/08/18, 53 y.o.   MRN: [id number]  HPI  [name] is a 53 y/o female with a history of asthma, DM, HTN, stroke, thyroid disease, GERD, fibromyalgia, migraines, obstructive sleep apnea and chronic heart failure.   Had echo done 10/04/2018 but unable to view those results. Echo report from 03/29/18 reviewed and showed an EF of 55-60%.  Admitted 04/16/2019 due to shortness of breath and intermittent confusion. Medications adjusted.  Discharged after 3 days.   She presents today for a follow-up visit although hasn't been seen since November 2019. She presents with a chief complaint of moderate shortness of breath upon minimal exertion. She describes this as chronic in nature having been present for several years. She has associated fatigue, pedal edema, palpitations, abdominal distention, light-headedness, difficulty sleeping and gradual weight gain along with this. She denies any chest pain or cough.   Says that she's been having issues with her blood pressure getting low which then causes her to pass out and then have seizures. She is being followed by GI for a fatty liver. Drinking ~ 2L of water daily.   Past Medical History:  Diagnosis Date  . Arthritis    knees, shoulder, upper back  . Asthma   . CHF (congestive heart failure) (HCC)   . Diabetes mellitus without complication (HCC)   . Environmental allergies   . Fibromyalgia   . GERD (gastroesophageal reflux disease)   . GI bleed 10/02/2018  . Headache    migraines - 5x/mo  . Hypertension   . Hypokalemia 10/14/2017  . Migraines   . Motion sickness    ships  . Sleep apnea   . Stroke [facility])    no residual deficits  . Thyroid nodule    bilateral and goiter  . Wears contact lenses   . Wears dentures    partial upper   Past Surgical History:  Procedure Laterality Date  . ABDOMINAL HYSTERECTOMY  2000's  . ABDOMINAL SURGERY     laparoscopy x4 with lysis of adhesions  . APPENDECTOMY   1986  . CESAREAN SECTION  1992  . CHOLECYSTECTOMY N/A 04/03/2017   Procedure: LAPAROSCOPIC CHOLECYSTECTOMY;  Surgeon: Henrene Dodge, MD;  Location: [facility] ORS;  Service: General;  Laterality: N/A;  . COLONOSCOPY N/A 10/06/2018   Procedure: COLONOSCOPY;  Surgeon: Toney Reil, MD;  Location: [facility] ENDOSCOPY;  Service: Gastroenterology;  Laterality: N/A;  . COLONOSCOPY WITH PROPOFOL N/A 03/10/2017   Procedure: COLONOSCOPY WITH PROPOFOL;  Surgeon: Scot Jun, MD;  Location: [facility] ENDOSCOPY;  Service: Endoscopy;  Laterality: N/A;  . ECTOPIC PREGNANCY SURGERY  2000's  . ESOPHAGOGASTRODUODENOSCOPY (EGD) WITH PROPOFOL N/A 03/10/2017   Procedure: ESOPHAGOGASTRODUODENOSCOPY (EGD) WITH PROPOFOL;  Surgeon: Scot Jun, MD;  Location: [facility] ENDOSCOPY;  Service: Endoscopy;  Laterality: N/A;  . HERNIA REPAIR    . JOINT REPLACEMENT Right 12/16/12   knee- medial - makoplasty  . KNEE ARTHROSCOPY Right 2006   partial medial and lateral meniscectomies  . KNEE ARTHROSCOPY Right 10/06/2015   Procedure: RIGHT KNEE ARTHROSCOPY WITH DEBRIDEMENT;  Surgeon: Erin Sons, MD;  Location: [facility];  Service: Orthopedics;  Laterality: Right;  Diabetic - insulin and oral meds  . ROTATOR CUFF REPAIR Left 2006  . TUBAL LIGATION  2000's   Family History  Problem Relation Age of Onset  . Hypertension Mother   . Hypertension Father   . Diabetes Father   . Allergies Father   . Kidney disease  Father   . Breast cancer Neg Hx    Social History   Tobacco Use  . Smoking status: Never Smoker  . Smokeless tobacco: Never Used  Substance Use Topics  . Alcohol use: No   Allergies  Allergen Reactions  . Penicillins Anaphylaxis    Has patient had a PCN reaction causing immediate rash, facial/tongue/throat swelling, SOB or lightheadedness with hypotension: Yes Has patient had a PCN reaction causing severe rash involving mucus membranes or skin necrosis: No Has patient had a PCN reaction that required  hospitalization No Has patient had a PCN reaction occurring within the last 10 years: No If all of the above answers are "NO", then may proceed with Cephalosporin use.   . Ace Inhibitors Swelling   Prior to Admission medications   Medication Sig Start Date End Date Taking? Authorizing Provider  albuterol (PROVENTIL HFA) 108 (90 Base) MCG/ACT inhaler Inhale 2 puffs into the lungs every 4 (four) hours as needed for wheezing or shortness of breath. 07/13/19  Yes Parrett, Tammy S, NP  Ascorbic Acid (VITAMIN C) 1000 MG tablet Take 500 mg by mouth 2 (two) times daily.    Yes [provider]  DULoxetine (CYMBALTA) 60 MG capsule Take 60 mg by mouth 2 (two) times daily.   Yes [provider]  flunisolide (NASALIDE) 25 MCG/ACT (0.025%) SOLN Place 2 sprays into the nose daily as needed.   Yes [provider]  fluticasone furoate-vilanterol (BREO ELLIPTA) 200-25 MCG/INH AEPB Inhale 1 puff into the lungs daily. 1 puff daily. 07/14/19  Yes Parrett, Tammy S, NP  furosemide (LASIX) 40 MG tablet 20 mg 2 (two) times daily.  10/16/18  Yes [provider]  gabapentin (NEURONTIN) 300 MG capsule Take 600 mg by mouth 2 (two) times daily. 03/22/18  Yes [provider]  ipratropium-albuterol (DUONEB) 0.5-2.5 (3) MG/3ML SOLN Inhale 3 mLs into the lungs 3 (three) times daily as needed (shortness of breath, wheezing or cough). 07/13/19  Yes Parrett, Tammy S, NP  isosorbide mononitrate (IMDUR) 30 MG 24 hr tablet Take 1 tablet (30 mg total) by mouth daily. 10/17/17  Yes Johnson, Maylene Roeslanford L, MD  LINZESS 290 MCG CAPS capsule Take 1 tablet by mouth daily before breakfast.  02/28/17  Yes [provider]  liraglutide (VICTOZA) 18 MG/3ML SOPN Inject into the skin daily.   Yes [provider]  losartan (COZAAR) 100 MG tablet Take 100 mg by mouth daily.   Yes [provider]  metoprolol succinate (TOPROL-XL) 100 MG 24 hr tablet Take by mouth.   Yes [provider]  mirtazapine (REMERON SOL-TAB) 30 MG disintegrating tablet Take 30 mg by mouth daily.  03/18/18  Yes [provider]  montelukast (SINGULAIR) 10 MG tablet Take 1 tablet (10 mg total) by mouth at bedtime. 07/13/19 07/12/20 Yes Parrett, Tammy S, NP  Multiple Vitamin (MULTIVITAMIN) tablet Take 1 tablet by mouth daily.   Yes [provider]  omeprazole (PRILOSEC) 40 MG capsule Take 1 capsule (40 mg total) by mouth daily. 1 hr before supper 04/05/17  Yes Piscoya, Jose, MD  Potassium Chloride ER 20 MEQ TBCR Take 20 mEq by mouth daily. Take with lasix 09/30/18  Yes [provider]  potassium chloride SA (KLOR-CON) 20 MEQ tablet Take 20 mEq by mouth daily.  03/16/19  Yes [provider]  pravastatin (PRAVACHOL) 80 MG tablet Take 1 tablet (80 mg total) by mouth daily. 10/17/17 08/24/19 Yes Johnson, Clanford L, MD  senna-docusate (SENOKOT-S) 8.6-50 MG tablet  Take 1 tablet by mouth 2 (two) times daily. 10/06/18  Yes Vaughan Basta, MD  sitaGLIPtin (JANUVIA) 25 MG tablet Take 25 mg by mouth daily.   Yes [provider]  SUMAtriptan (IMITREX) 25 MG tablet Take 25 mg by mouth every 2 (two) hours as needed for migraine. May repeat in 2 hours if headache persists or recurs.   Yes [provider]  tiotropium (SPIRIVA HANDIHALER) 18 MCG inhalation capsule Place 1 capsule (18 mcg total) into inhaler and inhale daily. 07/13/19 10/11/19 Yes Parrett, Tammy S, NP  tiZANidine (ZANAFLEX) 4 MG tablet Take 4 mg by mouth every 8 (eight) hours as needed for muscle spasms.    Yes [provider]  topiramate (TOPAMAX) 50 MG tablet Take by mouth. 03/16/19  Yes [provider]  Vitamin D, Ergocalciferol, (DRISDOL) 1.25 MG (50000 UT) CAPS capsule Take 1 capsule (50,000 Units total) by mouth every 7 (seven) days. 08/09/19  Yes Vanga, Tally Due, MD  zolpidem (AMBIEN) 10 MG tablet Take 10 mg by mouth at bedtime as needed for sleep.    Yes [provider]     Review of Systems  Constitutional: Positive for fatigue (with minimal exertion). Negative for appetite change.  HENT: Negative for congestion and postnasal drip.   Eyes: Negative.   Respiratory: Positive for shortness of breath (easily). Negative for cough and chest tightness.   Cardiovascular: Positive for palpitations and leg swelling (both feet). Negative for chest pain.  Gastrointestinal: Positive for abdominal distention. Negative for abdominal pain.  Endocrine: Negative.   Genitourinary: Negative.   Musculoskeletal: Positive for arthralgias (feet hurting). Negative for back pain.  Skin: Negative.   Allergic/Immunologic: Negative.   Neurological: Positive for light-headedness (sometimes). Negative for dizziness.  Hematological: Negative for adenopathy. Does not bruise/bleed easily.  Psychiatric/Behavioral: Positive for sleep disturbance. Negative for dysphoric mood. The patient is not nervous/anxious.    Vitals:   08/24/19 0946  BP: (!) 150/107  Pulse: (!) 108  Resp: 20  SpO2: 100%  Weight: 209 lb (94.8 kg)  Height: 5\' 5"  (1.651 m)   Wt Readings from Last 3 Encounters:  08/24/19 209 lb (94.8 kg)  08/05/19 207 lb 4 oz (94 kg)  07/13/19 205 lb 3.2 oz (93.1 kg)   Lab Results  Component Value Date   CREATININE 0.92 11/14/2018   CREATININE 1.08 (H) 11/11/2018   CREATININE 1.30 (H) 10/12/2018    Physical Exam Vitals and nursing note reviewed.  Constitutional:      Appearance: She is well-developed.  HENT:     Head: Normocephalic and atraumatic.  Neck:     Thyroid: No thyromegaly.  Cardiovascular:     Rate and Rhythm: Normal rate and regular rhythm.  Pulmonary:     Effort: Pulmonary effort is normal. No respiratory distress.     Breath sounds: No wheezing or rales.  Abdominal:     General: There is distension.     Palpations: Abdomen is soft.  Musculoskeletal:        General: Tenderness (over bilateral shins) present.     Cervical back:  Normal range of motion and neck supple.     Right lower leg: No tenderness. Edema (trace pitting) present.     Left lower leg: No tenderness. Edema (trace pitting) present.  Skin:    General: Skin is warm and dry.  Neurological:     Mental Status: She is alert and oriented to person, place, and time.  Psychiatric:  Behavior: Behavior normal.    Assessment & Plan:  1: Acute on Chronic heart failure with preserved ejection fraction- - NYHA class III - minimally fluid overloaded today with increasing weight and shortness of breath - will double her furosemide to 40mg  BID and potassium every day for the next 3 days - will check BMP next week - weighing daily and she was reminded to call for an overnight weight gain of >2 pounds or a weekly weight gain of >5 pounds - follows with cardiology ) & saw him ~ Nov 2020 - not adding salt and it was reviewed about keeping daily sodium intake to 2000mg  daily - drinking ~ 2L of water daily - BNP 07/09/18 was 12.0  - has not received her flu vaccine yet  2: HTN- - BP elevated today; will be increasing furosemide for a few days per above but need to be cautious since she does get hypotensive  - saw PCP (Aycock) ~ 2 weeks ago - BMP 07/05/2019 reviewed and showed sodium 138, potassium 4.1, creatinine 1.1 and GFR 66  3: DM- - A1c 08/05/2019 was 11.7%; taking different insulin but doesn't remember the name - glucose at home this morning was 180 - saw endocrinology 14/11/2018) 06/29/18  4: Obstructive sleep apnea- - saw pulmonolgist (Parrett) 07/13/2019  5: Fatty liver- - saw GI (Vanga) 08/05/2019  Patient did not bring her medications nor a list and isn't entirely sure of what she is taking. Emphasized that she needed to bring her medications to every visit; she says that her medications are currently being bubblepacked.   Return next week for a recheck of symptoms and will check a BMP at that time as well.

## 2019-08-24 NOTE — Patient Instructions (Addendum)
Continue weighing daily and call for an overnight weight gain of > 2 pounds or a weekly weight gain of >5 pounds.  Double lasix and potassium for the next 3 days.

## 2019-08-25 ENCOUNTER — Ambulatory Visit (INDEPENDENT_AMBULATORY_CARE_PROVIDER_SITE_OTHER): Payer: 59 | Admitting: Obstetrics and Gynecology

## 2019-08-25 ENCOUNTER — Encounter: Payer: Self-pay | Admitting: Obstetrics and Gynecology

## 2019-08-25 VITALS — BP 152/107 | HR 111 | Ht 65.0 in | Wt 205.1 lb

## 2019-08-25 DIAGNOSIS — Z6834 Body mass index (BMI) 34.0-34.9, adult: Secondary | ICD-10-CM

## 2019-08-25 DIAGNOSIS — E1165 Type 2 diabetes mellitus with hyperglycemia: Secondary | ICD-10-CM

## 2019-08-25 DIAGNOSIS — I5033 Acute on chronic diastolic (congestive) heart failure: Secondary | ICD-10-CM | POA: Diagnosis not present

## 2019-08-25 NOTE — Progress Notes (Signed)
HPI:      Amanda [name] is a 53 y.o. G7P0 who LMP was No LMP recorded. Patient has had a hysterectomy.  Subjective:   She presents today to discuss possible weight loss.  Patient has multiple medical problems including congestive heart failure, hypertension, fatty liver, and uncontrolled diabetes. Of significant note patient was a previous body builder and was very good at counting calories and exercising appropriately.  She has recently moved in with her daughter and has begun to eat less healthy thus gaining weight.    Hx: The following portions of the patient's history were reviewed and updated as appropriate:             She  has a past medical history of Arthritis, Asthma, CHF (congestive heart failure) (HCC), Diabetes mellitus without complication (HCC), Environmental allergies, Fibromyalgia, GERD (gastroesophageal reflux disease), GI bleed (10/02/2018), Headache, Hypertension, Hypokalemia (10/14/2017), Migraines, Motion sickness, Sleep apnea, Stroke [facility]), Thyroid nodule, Wears contact lenses, and Wears dentures. She does not have any pertinent problems on file. She  has a past surgical history that includes Abdominal surgery; Ectopic pregnancy surgery (2000's); Knee arthroscopy (Right, 2006); Hernia repair; Rotator cuff repair (Left, 2006); Knee arthroscopy (Right, 10/06/2015); Cesarean section (1992); Appendectomy (1986); Tubal ligation (2000's); Abdominal hysterectomy (2000's); Joint replacement (Right, 12/16/12); Colonoscopy with propofol (N/A, 03/10/2017); Esophagogastroduodenoscopy (egd) with propofol (N/A, 03/10/2017); Cholecystectomy (N/A, 04/03/2017); and Colonoscopy (N/A, 10/06/2018). Her family history includes Allergies in her father; Diabetes in her father; Hypertension in her father and mother; Kidney disease in her father. She  reports that she has never smoked. She has never used smokeless tobacco. She reports that she does not drink alcohol or use drugs. She has a current medication  list which includes the following prescription(s): albuterol, vitamin c, duloxetine, flunisolide, breo ellipta, furosemide, furosemide, gabapentin, ipratropium-albuterol, isosorbide mononitrate, linzess, liraglutide, losartan, metoprolol succinate, mirtazapine, montelukast, multivitamin, omeprazole, potassium chloride er, potassium chloride sa, potassium chloride sa, sitagliptin, sumatriptan, spiriva handihaler, tizanidine, topiramate, vitamin d (ergocalciferol), zolpidem, pravastatin, and senna-docusate. She is allergic to penicillins and ace inhibitors.       Review of Systems:  Review of Systems  Constitutional: Denied constitutional symptoms, night sweats, recent illness, fatigue, fever, insomnia and weight loss.  Eyes: Denied eye symptoms, eye pain, photophobia, vision change and visual disturbance.  Ears/Nose/Throat/Neck: Denied ear, nose, throat or neck symptoms, hearing loss, nasal discharge, sinus congestion and sore throat.  Cardiovascular: Denied cardiovascular symptoms, arrhythmia, chest pain/pressure, edema, exercise intolerance, orthopnea and palpitations.  Respiratory: Denied pulmonary symptoms, asthma, pleuritic pain, productive sputum, cough, dyspnea and wheezing.  Gastrointestinal: Denied, gastro-esophageal reflux, melena, nausea and vomiting.  Genitourinary: Denied genitourinary symptoms including symptomatic vaginal discharge, pelvic relaxation issues, and urinary complaints.  Musculoskeletal: Denied musculoskeletal symptoms, stiffness, swelling, muscle weakness and myalgia.  Dermatologic: Denied dermatology symptoms, rash and scar.  Neurologic: Denied neurology symptoms, dizziness, headache, neck pain and syncope.  Psychiatric: Denied psychiatric symptoms, anxiety and depression.  Endocrine: Denied endocrine symptoms including hot flashes and night sweats.   Meds:   Current Outpatient Medications on File Prior to Visit  Medication Sig Dispense Refill  . albuterol  (PROVENTIL HFA) 108 (90 Base) MCG/ACT inhaler Inhale 2 puffs into the lungs every 4 (four) hours as needed for wheezing or shortness of breath. 18 g 1  . Ascorbic Acid (VITAMIN C) 1000 MG tablet Take 500 mg by mouth 2 (two) times daily.     . DULoxetine (CYMBALTA) 60 MG capsule Take 60 mg by mouth 2 (two) times  daily.    . flunisolide (NASALIDE) 25 MCG/ACT (0.025%) SOLN Place 2 sprays into the nose daily as needed.    . fluticasone furoate-vilanterol (BREO ELLIPTA) 200-25 MCG/INH AEPB Inhale 1 puff into the lungs daily. 1 puff daily. 60 each 2  . furosemide (LASIX) 20 MG tablet Take 1 tablet (20 mg total) by mouth 2 (two) times daily. For the next 3 days in addition to your normal furosemide dose 6 tablet 0  . furosemide (LASIX) 40 MG tablet 20 mg 2 (two) times daily.     [name] Kitchen gabapentin (NEURONTIN) 300 MG capsule Take 600 mg by mouth 2 (two) times daily.    [name] Kitchen ipratropium-albuterol (DUONEB) 0.5-2.5 (3) MG/3ML SOLN Inhale 3 mLs into the lungs 3 (three) times daily as needed (shortness of breath, wheezing or cough). 360 mL 0  . isosorbide mononitrate (IMDUR) 30 MG 24 hr tablet Take 1 tablet (30 mg total) by mouth daily.    [name] Kitchen LINZESS 290 MCG CAPS capsule Take 1 tablet by mouth daily before breakfast.     . liraglutide (VICTOZA) 18 MG/3ML SOPN Inject into the skin daily.    [name] Kitchen losartan (COZAAR) 100 MG tablet Take 100 mg by mouth daily.    . metoprolol succinate (TOPROL-XL) 100 MG 24 hr tablet Take by mouth.    . mirtazapine (REMERON SOL-TAB) 30 MG disintegrating tablet Take 30 mg by mouth daily.   2  . montelukast (SINGULAIR) 10 MG tablet Take 1 tablet (10 mg total) by mouth at bedtime. 30 tablet 2  . Multiple Vitamin (MULTIVITAMIN) tablet Take 1 tablet by mouth daily.    [name] Kitchen omeprazole (PRILOSEC) 40 MG capsule Take 1 capsule (40 mg total) by mouth daily. 1 hr before supper 30 capsule 0  . Potassium Chloride ER 20 MEQ TBCR Take 20 mEq by mouth daily. Take with lasix    . potassium chloride SA (KLOR-CON) 20  MEQ tablet Take 20 mEq by mouth daily.     . potassium chloride SA (KLOR-CON) 20 MEQ tablet Take 1 tablet (20 mEq total) by mouth daily. While taking extra furosemide 3 tablet 0  . sitaGLIPtin (JANUVIA) 25 MG tablet Take 25 mg by mouth daily.    . SUMAtriptan (IMITREX) 25 MG tablet Take 25 mg by mouth every 2 (two) hours as needed for migraine. May repeat in 2 hours if headache persists or recurs.    [name] Kitchen tiotropium (SPIRIVA HANDIHALER) 18 MCG inhalation capsule Place 1 capsule (18 mcg total) into inhaler and inhale daily. 90 capsule 1  . tiZANidine (ZANAFLEX) 4 MG tablet Take 4 mg by mouth every 8 (eight) hours as needed for muscle spasms.     [name] Kitchen topiramate (TOPAMAX) 50 MG tablet Take by mouth.    . Vitamin D, Ergocalciferol, (DRISDOL) 1.25 MG (50000 UT) CAPS capsule Take 1 capsule (50,000 Units total) by mouth every 7 (seven) days. 12 capsule 0  . zolpidem (AMBIEN) 10 MG tablet Take 10 mg by mouth at bedtime as needed for sleep.     . pravastatin (PRAVACHOL) 80 MG tablet Take 1 tablet (80 mg total) by mouth daily. 30 tablet 0  . senna-docusate (SENOKOT-S) 8.6-50 MG tablet Take 1 tablet by mouth 2 (two) times daily. (Patient not taking: Reported on 08/25/2019) 30 tablet 0   No current facility-administered medications on file prior to visit.    Objective:     Vitals:   08/25/19 1431  BP: (!) 152/107  Pulse: (!) 111  Assessment:    G7P0 Patient Active Problem List   Diagnosis Date Noted  . Asthma 07/13/2019  . Hypotension 10/10/2018  . Acute on chronic diastolic (congestive) heart failure (HCC) 07/17/2018  . Acute on chronic heart failure (HCC) 07/08/2018  . Chronic diastolic heart failure (HCC) 04/17/2018  . Lymphedema 04/17/2018  . Chest pain 03/28/2018  . Hypertension 10/15/2017  . Wears dentures 10/15/2017  . Symptomatic cholelithiasis 04/02/2017  . Recurrent incisional hernia 03/17/2017  . Calculus of gallbladder without cholecystitis without obstruction  03/17/2017  . Helicobacter pylori gastritis 03/17/2017  . Depression 09/28/2016  . Diabetes mellitus, type 2 (HCC) 09/28/2016  . Tremor 07/01/2013  . Esophageal reflux 06/22/2012  . Major depressive disorder, single episode 08/22/2009  . Sleep apnea 05/30/2009  . Hyperlipidemia 08/10/2008     1. BMI 34.0-34.9,adult   2. Uncontrolled type 2 diabetes mellitus with hyperglycemia (HCC)   3. Acute on chronic diastolic (congestive) heart failure (HCC)     We have discussed her multiple medical problems.  Calorie counting, exercise, rapid versus long-term weight loss discussed.  Her current medical conditions in relationship to weight also discussed.  Diabetes and weight loss discussed.   Plan:            1.  Because of her multiple medical problems I think it is in her best interest to go to a clinic specifically designed for weight loss.  I have referred her to [facility].  She says she is very willing to go. Orders Orders Placed This Encounter  Procedures  . Amb Ref to Medical Weight Management    No orders of the defined types were placed in this encounter.     F/U  No follow-ups on file. I spent 25 minutes involved in the care of this patient of which greater than 50% was spent discussing current medical issues, methods of weight loss in regard to her medical issues, diet exercise calorie counting etc.  All questions answered.  Elonda Huskyavid J. Lichelle Viets, M.D. 08/25/2019 2:58 PM

## 2019-08-26 ENCOUNTER — Other Ambulatory Visit: Payer: Self-pay

## 2019-08-26 ENCOUNTER — Emergency Department: Payer: 59

## 2019-08-26 ENCOUNTER — Observation Stay
Admission: EM | Admit: 2019-08-26 | Discharge: 2019-08-27 | Disposition: A | Discharge status: Home / Self Care | Payer: 59 | Attending: Internal Medicine | Admitting: Internal Medicine

## 2019-08-26 DIAGNOSIS — G4730 Sleep apnea, unspecified: Secondary | ICD-10-CM | POA: Diagnosis not present

## 2019-08-26 DIAGNOSIS — I130 Hypertensive heart and chronic kidney disease with heart failure and stage 1 through stage 4 chronic kidney disease, or unspecified chronic kidney disease: Secondary | ICD-10-CM | POA: Diagnosis not present

## 2019-08-26 DIAGNOSIS — Z79899 Other long term (current) drug therapy: Secondary | ICD-10-CM | POA: Diagnosis not present

## 2019-08-26 DIAGNOSIS — G40909 Epilepsy, unspecified, not intractable, without status epilepticus: Secondary | ICD-10-CM | POA: Diagnosis not present

## 2019-08-26 DIAGNOSIS — J45909 Unspecified asthma, uncomplicated: Secondary | ICD-10-CM | POA: Insufficient documentation

## 2019-08-26 DIAGNOSIS — Z7951 Long term (current) use of inhaled steroids: Secondary | ICD-10-CM | POA: Insufficient documentation

## 2019-08-26 DIAGNOSIS — Z8249 Family history of ischemic heart disease and other diseases of the circulatory system: Secondary | ICD-10-CM | POA: Insufficient documentation

## 2019-08-26 DIAGNOSIS — F329 Major depressive disorder, single episode, unspecified: Secondary | ICD-10-CM | POA: Insufficient documentation

## 2019-08-26 DIAGNOSIS — Z20828 Contact with and (suspected) exposure to other viral communicable diseases: Secondary | ICD-10-CM | POA: Insufficient documentation

## 2019-08-26 DIAGNOSIS — I5033 Acute on chronic diastolic (congestive) heart failure: Secondary | ICD-10-CM | POA: Diagnosis not present

## 2019-08-26 DIAGNOSIS — E669 Obesity, unspecified: Secondary | ICD-10-CM | POA: Insufficient documentation

## 2019-08-26 DIAGNOSIS — M1990 Unspecified osteoarthritis, unspecified site: Secondary | ICD-10-CM | POA: Insufficient documentation

## 2019-08-26 DIAGNOSIS — M797 Fibromyalgia: Secondary | ICD-10-CM | POA: Insufficient documentation

## 2019-08-26 DIAGNOSIS — N183 Chronic kidney disease, stage 3 unspecified: Secondary | ICD-10-CM | POA: Diagnosis not present

## 2019-08-26 DIAGNOSIS — R55 Syncope and collapse: Secondary | ICD-10-CM | POA: Diagnosis not present

## 2019-08-26 DIAGNOSIS — E785 Hyperlipidemia, unspecified: Secondary | ICD-10-CM | POA: Insufficient documentation

## 2019-08-26 DIAGNOSIS — Z23 Encounter for immunization: Secondary | ICD-10-CM | POA: Insufficient documentation

## 2019-08-26 DIAGNOSIS — K219 Gastro-esophageal reflux disease without esophagitis: Secondary | ICD-10-CM | POA: Diagnosis not present

## 2019-08-26 DIAGNOSIS — G43909 Migraine, unspecified, not intractable, without status migrainosus: Secondary | ICD-10-CM | POA: Insufficient documentation

## 2019-08-26 DIAGNOSIS — Z7984 Long term (current) use of oral hypoglycemic drugs: Secondary | ICD-10-CM | POA: Insufficient documentation

## 2019-08-26 DIAGNOSIS — E1165 Type 2 diabetes mellitus with hyperglycemia: Secondary | ICD-10-CM

## 2019-08-26 DIAGNOSIS — I952 Hypotension due to drugs: Secondary | ICD-10-CM | POA: Diagnosis not present

## 2019-08-26 DIAGNOSIS — I959 Hypotension, unspecified: Secondary | ICD-10-CM | POA: Diagnosis present

## 2019-08-26 DIAGNOSIS — Z8673 Personal history of transient ischemic attack (TIA), and cerebral infarction without residual deficits: Secondary | ICD-10-CM | POA: Insufficient documentation

## 2019-08-26 DIAGNOSIS — E1122 Type 2 diabetes mellitus with diabetic chronic kidney disease: Secondary | ICD-10-CM | POA: Insufficient documentation

## 2019-08-26 LAB — COMPREHENSIVE METABOLIC PANEL
ALT: 73 U/L — ABNORMAL HIGH (ref 0–44)
AST: 103 U/L — ABNORMAL HIGH (ref 15–41)
Albumin: 3.6 g/dL (ref 3.5–5.0)
Alkaline Phosphatase: 102 U/L (ref 38–126)
Anion gap: 12 (ref 5–15)
BUN: 15 mg/dL (ref 6–20)
CO2: 22 mmol/L (ref 22–32)
Calcium: 8.9 mg/dL (ref 8.9–10.3)
Chloride: 102 mmol/L (ref 98–111)
Creatinine, Ser: 1.21 mg/dL — ABNORMAL HIGH (ref 0.44–1.00)
GFR calc Af Amer: 59 mL/min — ABNORMAL LOW (ref >60)
GFR calc non Af Amer: 51 mL/min — ABNORMAL LOW (ref >60)
Glucose, Bld: 273 mg/dL — ABNORMAL HIGH (ref 70–99)
Potassium: 4.3 mmol/L (ref 3.5–5.1)
Sodium: 136 mmol/L (ref 135–145)
Total Bilirubin: 0.7 mg/dL (ref 0.3–1.2)
Total Protein: 6.7 g/dL (ref 6.5–8.1)

## 2019-08-26 LAB — RESPIRATORY PANEL BY RT PCR (FLU A&B, COVID)
Influenza A by PCR: NEGATIVE
Influenza B by PCR: NEGATIVE
SARS Coronavirus 2 by RT PCR: NEGATIVE

## 2019-08-26 LAB — CBC WITH DIFFERENTIAL/PLATELET
Abs Immature Granulocytes: 0.02 10*3/uL (ref 0.00–0.07)
Basophils Absolute: 0 10*3/uL (ref 0.0–0.1)
Basophils Relative: 1 %
Eosinophils Absolute: 0.1 10*3/uL (ref 0.0–0.5)
Eosinophils Relative: 1 %
HCT: 39.5 % (ref 36.0–46.0)
Hemoglobin: 12.6 g/dL (ref 12.0–15.0)
Immature Granulocytes: 0 %
Lymphocytes Relative: 38 %
Lymphs Abs: 1.8 10*3/uL (ref 0.7–4.0)
MCH: 30.1 pg (ref 26.0–34.0)
MCHC: 31.9 g/dL (ref 30.0–36.0)
MCV: 94.3 fL (ref 80.0–100.0)
Monocytes Absolute: 0.3 10*3/uL (ref 0.1–1.0)
Monocytes Relative: 7 %
Neutro Abs: 2.5 10*3/uL (ref 1.7–7.7)
Neutrophils Relative %: 53 %
Platelets: 271 10*3/uL (ref 150–400)
RBC: 4.19 MIL/uL (ref 3.87–5.11)
RDW: 14.1 % (ref 11.5–15.5)
WBC: 4.8 10*3/uL (ref 4.0–10.5)
nRBC: 0 % (ref 0.0–0.2)

## 2019-08-26 LAB — LACTIC ACID, PLASMA: Lactic Acid, Venous: 1.7 mmol/L (ref 0.5–1.9)

## 2019-08-26 LAB — TROPONIN I (HIGH SENSITIVITY)
Troponin I (High Sensitivity): <2 ng/L (ref <18)
Troponin I (High Sensitivity): 2 ng/L (ref <18)

## 2019-08-26 LAB — GLUCOSE, CAPILLARY: Glucose-Capillary: 255 mg/dL — ABNORMAL HIGH (ref 70–99)

## 2019-08-26 MED ORDER — TOPIRAMATE 25 MG PO TABS
50.0000 mg | ORAL_TABLET | Freq: Two times a day (BID) | ORAL | Status: DC
  Administered 2019-08-26 – 2019-08-27 (×2): 50 mg via ORAL
  Filled 2019-08-26 (×3): qty 2

## 2019-08-26 MED ORDER — FLUTICASONE FUROATE-VILANTEROL 200-25 MCG/INH IN AEPB
1.0000 | INHALATION_SPRAY | Freq: Every day | RESPIRATORY_TRACT | Status: DC
  Administered 2019-08-27: 1 via RESPIRATORY_TRACT
  Filled 2019-08-26: qty 28

## 2019-08-26 MED ORDER — ALBUTEROL SULFATE (2.5 MG/3ML) 0.083% IN NEBU: 2.5000 mg | INHALATION_SOLUTION | RESPIRATORY_TRACT | Status: DC | PRN

## 2019-08-26 MED ORDER — MONTELUKAST SODIUM 10 MG PO TABS
10.0000 mg | ORAL_TABLET | Freq: Every day | ORAL | Status: DC
  Administered 2019-08-26: 21:00:00 10 mg via ORAL
  Filled 2019-08-26: qty 1

## 2019-08-26 MED ORDER — SODIUM CHLORIDE 0.9 % IV SOLN: INTRAVENOUS | Status: DC

## 2019-08-26 MED ORDER — INFLUENZA VAC SPLIT QUAD 0.5 ML IM SUSY
0.5000 mL | PREFILLED_SYRINGE | INTRAMUSCULAR | Status: AC
  Administered 2019-08-27: 0.5 mL via INTRAMUSCULAR
  Filled 2019-08-26: qty 0.5

## 2019-08-26 MED ORDER — SENNOSIDES-DOCUSATE SODIUM 8.6-50 MG PO TABS
1.0000 | ORAL_TABLET | Freq: Two times a day (BID) | ORAL | Status: DC
  Administered 2019-08-26 – 2019-08-27 (×2): 1 via ORAL
  Filled 2019-08-26 (×2): qty 1

## 2019-08-26 MED ORDER — ONDANSETRON HCL 4 MG/2ML IJ SOLN: 4.0000 mg | Freq: Four times a day (QID) | INTRAMUSCULAR | Status: DC | PRN

## 2019-08-26 MED ORDER — ACETAMINOPHEN 325 MG PO TABS
650.0000 mg | ORAL_TABLET | Freq: Four times a day (QID) | ORAL | Status: DC | PRN
  Administered 2019-08-27: 650 mg via ORAL
  Filled 2019-08-26: qty 2

## 2019-08-26 MED ORDER — TIOTROPIUM BROMIDE MONOHYDRATE 18 MCG IN CAPS
18.0000 ug | ORAL_CAPSULE | Freq: Every day | RESPIRATORY_TRACT | Status: DC
  Administered 2019-08-27: 18 ug via RESPIRATORY_TRACT
  Filled 2019-08-26: qty 5

## 2019-08-26 MED ORDER — SODIUM CHLORIDE 0.9 % IV BOLUS
1000.0000 mL | Freq: Once | INTRAVENOUS | Status: AC
  Administered 2019-08-26: 1000 mL via INTRAVENOUS

## 2019-08-26 MED ORDER — DULOXETINE HCL 30 MG PO CPEP
60.0000 mg | ORAL_CAPSULE | Freq: Two times a day (BID) | ORAL | Status: DC
  Administered 2019-08-26 – 2019-08-27 (×2): 60 mg via ORAL
  Filled 2019-08-26 (×2): qty 2

## 2019-08-26 MED ORDER — GABAPENTIN 300 MG PO CAPS
600.0000 mg | ORAL_CAPSULE | Freq: Two times a day (BID) | ORAL | Status: DC
  Administered 2019-08-26 – 2019-08-27 (×2): 600 mg via ORAL
  Filled 2019-08-26 (×2): qty 2

## 2019-08-26 MED ORDER — ACETAMINOPHEN 650 MG RE SUPP: 650.0000 mg | Freq: Four times a day (QID) | RECTAL | Status: DC | PRN

## 2019-08-26 MED ORDER — PANTOPRAZOLE SODIUM 40 MG PO TBEC
40.0000 mg | DELAYED_RELEASE_TABLET | Freq: Every day | ORAL | Status: DC
  Administered 2019-08-26 – 2019-08-27 (×2): 40 mg via ORAL
  Filled 2019-08-26 (×2): qty 1

## 2019-08-26 MED ORDER — TIZANIDINE HCL 4 MG PO TABS
4.0000 mg | ORAL_TABLET | Freq: Three times a day (TID) | ORAL | Status: DC
  Administered 2019-08-26 – 2019-08-27 (×3): 4 mg via ORAL
  Filled 2019-08-26 (×4): qty 1

## 2019-08-26 MED ORDER — ENOXAPARIN SODIUM 40 MG/0.4ML ~~LOC~~ SOLN
40.0000 mg | SUBCUTANEOUS | Status: DC
  Administered 2019-08-26: 40 mg via SUBCUTANEOUS
  Filled 2019-08-26: qty 0.4

## 2019-08-26 MED ORDER — IPRATROPIUM-ALBUTEROL 0.5-2.5 (3) MG/3ML IN SOLN: 3.0000 mL | Freq: Three times a day (TID) | RESPIRATORY_TRACT | Status: DC | PRN

## 2019-08-26 MED ORDER — PRAVASTATIN SODIUM 20 MG PO TABS
80.0000 mg | ORAL_TABLET | Freq: Every day | ORAL | Status: DC
  Administered 2019-08-27: 80 mg via ORAL
  Filled 2019-08-26: qty 4

## 2019-08-26 MED ORDER — SUMATRIPTAN SUCCINATE 50 MG PO TABS
25.0000 mg | ORAL_TABLET | ORAL | Status: DC | PRN
  Filled 2019-08-26: qty 1

## 2019-08-26 MED ORDER — ZOLPIDEM TARTRATE 5 MG PO TABS
5.0000 mg | ORAL_TABLET | Freq: Every day | ORAL | Status: DC
  Administered 2019-08-26: 5 mg via ORAL
  Filled 2019-08-26: qty 1

## 2019-08-26 MED ORDER — LINACLOTIDE 290 MCG PO CAPS
290.0000 ug | ORAL_CAPSULE | Freq: Every day | ORAL | Status: DC
  Administered 2019-08-27: 290 ug via ORAL
  Filled 2019-08-26: qty 1

## 2019-08-26 MED ORDER — MIRTAZAPINE 15 MG PO TBDP
30.0000 mg | ORAL_TABLET | Freq: Every day | ORAL | Status: DC
  Administered 2019-08-26 – 2019-08-27 (×2): 30 mg via ORAL
  Filled 2019-08-26 (×2): qty 2

## 2019-08-26 MED ORDER — ONDANSETRON HCL 4 MG PO TABS: 4.0000 mg | ORAL_TABLET | Freq: Four times a day (QID) | ORAL | Status: DC | PRN

## 2019-08-26 NOTE — ED Provider Notes (Signed)
[facility] Emergency Department Provider Note ____________________________________________   First MD Initiated Contact with Patient 08/26/19 1527     (approximate)  I have reviewed the triage vital signs and the nursing notes.   HISTORY  Chief Complaint Loss of Consciousness, Fall, and Hypotension    HPI Amanda [name] is a 53 y.o. female with PMH as noted below who presents with syncope and an apparent seizure.  Per EMS, the family reported that the patient was going to the bathroom when she passed out and hit her head on the ground.  She subsequently had seizure-like activity for a few minutes.  When EMS arrived, the patient appeared sleepy and confused and then passed out and had a second seizure-like episode.  The patient states that she has been feeling somewhat weak and dizzy today, but prior to today was feeling well.  She denies any acute pain.  She states she last had a seizure a few years ago and is compliant with her Topamax.  She reports few prior episodes in which she has passed out and had low blood pressure.  Past Medical History:  Diagnosis Date  . Arthritis    knees, shoulder, upper back  . Asthma   . CHF (congestive heart failure) (HCC)   . Diabetes mellitus without complication (HCC)   . Environmental allergies   . Fibromyalgia   . GERD (gastroesophageal reflux disease)   . GI bleed 10/02/2018  . Headache    migraines - 5x/mo  . Hypertension   . Hypokalemia 10/14/2017  . Migraines   . Motion sickness    ships  . Sleep apnea   . Stroke [facility])    no residual deficits  . Thyroid nodule    bilateral and goiter  . Wears contact lenses   . Wears dentures    partial upper    Patient Active Problem List   Diagnosis Date Noted  . Syncope and collapse 08/26/2019  . Asthma 07/13/2019  . Hypotension 10/10/2018  . Acute on chronic diastolic (congestive) heart failure (HCC) 07/17/2018  . Acute on chronic heart failure (HCC)  07/08/2018  . Chronic diastolic heart failure (HCC) 04/17/2018  . Lymphedema 04/17/2018  . Chest pain 03/28/2018  . Hypertension 10/15/2017  . Wears dentures 10/15/2017  . Symptomatic cholelithiasis 04/02/2017  . Recurrent incisional hernia 03/17/2017  . Calculus of gallbladder without cholecystitis without obstruction 03/17/2017  . Helicobacter pylori gastritis 03/17/2017  . Depression 09/28/2016  . Diabetes mellitus, type 2 (HCC) 09/28/2016  . Tremor 07/01/2013  . Esophageal reflux 06/22/2012  . Major depressive disorder, single episode 08/22/2009  . Sleep apnea 05/30/2009  . Hyperlipidemia 08/10/2008    Past Surgical History:  Procedure Laterality Date  . ABDOMINAL HYSTERECTOMY  2000's  . ABDOMINAL SURGERY     laparoscopy x4 with lysis of adhesions  . APPENDECTOMY  1986  . CESAREAN SECTION  1992  . CHOLECYSTECTOMY N/A 04/03/2017   Procedure: LAPAROSCOPIC CHOLECYSTECTOMY;  Surgeon: Henrene Dodge, MD;  Location: [facility] ORS;  Service: General;  Laterality: N/A;  . COLONOSCOPY N/A 10/06/2018   Procedure: COLONOSCOPY;  Surgeon: Toney Reil, MD;  Location: [facility] ENDOSCOPY;  Service: Gastroenterology;  Laterality: N/A;  . COLONOSCOPY WITH PROPOFOL N/A 03/10/2017   Procedure: COLONOSCOPY WITH PROPOFOL;  Surgeon: Scot Jun, MD;  Location: [facility] ENDOSCOPY;  Service: Endoscopy;  Laterality: N/A;  . ECTOPIC PREGNANCY SURGERY  2000's  . ESOPHAGOGASTRODUODENOSCOPY (EGD) WITH PROPOFOL N/A 03/10/2017   Procedure: ESOPHAGOGASTRODUODENOSCOPY (EGD) WITH PROPOFOL;  Surgeon:  Scot Jun, MD;  Location: [facility] ENDOSCOPY;  Service: Endoscopy;  Laterality: N/A;  . HERNIA REPAIR    . JOINT REPLACEMENT Right 12/16/12   knee- medial - makoplasty  . KNEE ARTHROSCOPY Right 2006   partial medial and lateral meniscectomies  . KNEE ARTHROSCOPY Right 10/06/2015   Procedure: RIGHT KNEE ARTHROSCOPY WITH DEBRIDEMENT;  Surgeon: Erin Sons, MD;  Location: [facility];  Service: Orthopedics;   Laterality: Right;  Diabetic - insulin and oral meds  . ROTATOR CUFF REPAIR Left 2006  . TUBAL LIGATION  2000's    Prior to Admission medications   Medication Sig Start Date End Date Taking? Authorizing Provider  albuterol (PROVENTIL HFA) 108 (90 Base) MCG/ACT inhaler Inhale 2 puffs into the lungs every 4 (four) hours as needed for wheezing or shortness of breath. 07/13/19  Yes Parrett, Tammy S, NP  Ascorbic Acid (VITAMIN C) 1000 MG tablet Take 500 mg by mouth 2 (two) times daily.    Yes [provider]  DULoxetine (CYMBALTA) 60 MG capsule Take 60 mg by mouth 2 (two) times daily.   Yes [provider]  flunisolide (NASALIDE) 25 MCG/ACT (0.025%) SOLN Place 2 sprays into the nose daily as needed.   Yes [provider]  fluticasone furoate-vilanterol (BREO ELLIPTA) 200-25 MCG/INH AEPB Inhale 1 puff into the lungs daily. 1 puff daily. 07/14/19  Yes Parrett, Tammy S, NP  furosemide (LASIX) 40 MG tablet Take 40 mg by mouth 2 (two) times daily.  10/16/18  Yes [provider]  gabapentin (NEURONTIN) 300 MG capsule Take 600 mg by mouth 2 (two) times daily. 03/22/18  Yes [provider]  ipratropium-albuterol (DUONEB) 0.5-2.5 (3) MG/3ML SOLN Inhale 3 mLs into the lungs 3 (three) times daily as needed (shortness of breath, wheezing or cough). 07/13/19  Yes Parrett, Tammy S, NP  isosorbide mononitrate (IMDUR) 30 MG 24 hr tablet Take 1 tablet (30 mg total) by mouth daily. 10/17/17  Yes Johnson, Maylene Roes, MD  LINZESS 290 MCG CAPS capsule Take 1 tablet by mouth daily before breakfast.  02/28/17  Yes [provider]  liraglutide (VICTOZA) 18 MG/3ML SOPN Inject into the skin daily.   Yes [provider]  losartan (COZAAR) 100 MG tablet Take 100 mg by mouth daily.   Yes [provider]  metoprolol succinate (TOPROL-XL) 100 MG 24 hr tablet Take 100 mg by mouth daily.    Yes [provider]  mirtazapine (REMERON SOL-TAB) 30 MG  disintegrating tablet Take 30 mg by mouth daily.  03/18/18  Yes [provider]  montelukast (SINGULAIR) 10 MG tablet Take 1 tablet (10 mg total) by mouth at bedtime. 07/13/19 07/12/20 Yes Parrett, Tammy S, NP  Multiple Vitamin (MULTIVITAMIN) tablet Take 1 tablet by mouth daily.   Yes [provider]  omeprazole (PRILOSEC) 40 MG capsule Take 1 capsule (40 mg total) by mouth daily. 1 hr before supper 04/05/17  Yes Piscoya, Jose, MD  Potassium Chloride ER 20 MEQ TBCR Take 20 mEq by mouth daily. Take with lasix 09/30/18  Yes [provider]  pravastatin (PRAVACHOL) 80 MG tablet Take 1 tablet (80 mg total) by mouth daily. 10/17/17 08/26/19 Yes Johnson, Clanford L, MD  senna-docusate (SENOKOT-S) 8.6-50 MG tablet Take 1 tablet by mouth 2 (two) times daily. 10/06/18  Yes Altamese Dilling, MD  sitaGLIPtin (JANUVIA) 25 MG tablet Take 25 mg by mouth daily.   Yes [provider]  SUMAtriptan (IMITREX) 25 MG tablet Take 25 mg by mouth every  2 (two) hours as needed for migraine. May repeat in 2 hours if headache persists or recurs.   Yes [provider]  tiotropium (SPIRIVA HANDIHALER) 18 MCG inhalation capsule Place 1 capsule (18 mcg total) into inhaler and inhale daily. 07/13/19 10/11/19 Yes Parrett, Tammy S, NP  tiZANidine (ZANAFLEX) 4 MG tablet Take 4 mg by mouth every 8 (eight) hours.    Yes [provider]  topiramate (TOPAMAX) 50 MG tablet Take 50 mg by mouth 2 (two) times daily.  03/16/19  Yes [provider]  Vitamin D, Ergocalciferol, (DRISDOL) 1.25 MG (50000 UT) CAPS capsule Take 1 capsule (50,000 Units total) by mouth every 7 (seven) days. 08/09/19  Yes Vanga, Loel Dubonnet, MD  zolpidem (AMBIEN) 10 MG tablet Take 10 mg by mouth at bedtime.    Yes [provider]    Allergies Penicillins and Ace inhibitors  Family History  Problem Relation Age of Onset  . Hypertension Mother   . Hypertension Father   . Diabetes Father   .  Allergies Father   . Kidney disease Father   . Breast cancer Neg Hx     Social History Social History   Tobacco Use  . Smoking status: Never Smoker  . Smokeless tobacco: Never Used  Substance Use Topics  . Alcohol use: No  . Drug use: No    Review of Systems  Constitutional: No fever.  Positive for weakness. Eyes: No redness. ENT: No sore throat. Cardiovascular: Denies chest pain. Respiratory: Denies shortness of breath. Gastrointestinal: No vomiting. Genitourinary: Negative for dysuria.  Musculoskeletal: Negative for back pain. Skin: Negative for rash. Neurological: Negative for headache.   ____________________________________________   PHYSICAL EXAM:  VITAL SIGNS: ED Triage Vitals  Enc Vitals Group     BP 08/26/19 1523 (!) 63/45     Pulse Rate 08/26/19 1523 78     Resp 08/26/19 1523 20     Temp --      Temp src --      SpO2 08/26/19 1523 (!) 88 %     Weight 08/26/19 1524 205 lb (93 kg)     Height 08/26/19 1524  (1.651 m)     Head Circumference --      Peak Flow --      Pain Score 08/26/19 1523 9     Pain Loc --      Pain Edu? --      Excl. in GC? --     Constitutional: Somewhat sleepy and weak appearing but conversant and oriented x4.  In no acute distress. Eyes: Conjunctivae are normal.  Head: Atraumatic. Nose: No congestion/rhinnorhea. Mouth/Throat: Mucous membranes are moist.   Neck: Normal range of motion.  Cardiovascular: Normal rate, regular rhythm. Grossly normal heart sounds.  Good peripheral circulation. Respiratory: Normal respiratory effort.  No retractions. Lungs CTAB. Gastrointestinal: Soft and nontender. No distention.  Genitourinary: No flank tenderness. Musculoskeletal: No lower extremity edema.  Extremities warm and well perfused.  Neurologic:  Normal speech and language.  Motor intact in all extremities. Skin:  Skin is warm and dry. No rash noted. Psychiatric: Calm and  cooperative. ____________________________________________   LABS (all labs ordered are listed, but only abnormal results are displayed)  Labs Reviewed  GLUCOSE, CAPILLARY - Abnormal; Notable for the following components:      Result Value   Glucose-Capillary 255 (*)    All other components within normal limits  COMPREHENSIVE METABOLIC PANEL - Abnormal; Notable for the following components:   Glucose, Bld  273 (*)    Creatinine, Ser 1.21 (*)    AST 103 (*)    ALT 73 (*)    GFR calc non Af Amer 51 (*)    GFR calc Af Amer 59 (*)    All other components within normal limits  RESPIRATORY PANEL BY RT PCR (FLU A&B, COVID)  CBC WITH DIFFERENTIAL/PLATELET  LACTIC ACID, PLASMA  URINALYSIS, COMPLETE (UACMP) WITH MICROSCOPIC  TROPONIN I (HIGH SENSITIVITY)  TROPONIN I (HIGH SENSITIVITY)   ____________________________________________  EKG  ED ECG REPORT I, Arta Silence, the attending physician, personally viewed and interpreted this ECG.  Date: 08/26/2019 EKG Time: 1518 Rate: 81 Rhythm: normal sinus rhythm QRS Axis: Right axis Intervals: normal ST/T Wave abnormalities: normal Narrative Interpretation: no evidence of acute ischemia  ____________________________________________  RADIOLOGY  CT head: No acute abnormality CXR: Bronchitic changes no focal infiltrate  ____________________________________________   PROCEDURES  Procedure(s) performed: No  Procedures  Critical Care performed: Yes  CRITICAL CARE Performed by: Arta Silence   Total critical care time: 40 minutes  Critical care time was exclusive of separately billable procedures and treating other patients.  Critical care was necessary to treat or prevent imminent or life-threatening deterioration.  Critical care was time spent personally by me on the following activities: development of treatment plan with patient and/or surrogate as well as nursing, discussions with consultants, evaluation of  patient's response to treatment, examination of patient, obtaining history from patient or surrogate, ordering and performing treatments and interventions, ordering and review of laboratory studies, ordering and review of radiographic studies, pulse oximetry and re-evaluation of patient's condition. ____________________________________________   INITIAL IMPRESSION / ASSESSMENT AND PLAN / ED COURSE  Pertinent labs & imaging results that were available during my care of the patient were reviewed by me and considered in my medical decision making (see chart for details).  53 year old female with PMH as noted above presents with an episode of apparent syncope resulting in a fall and head injury.  The patient subsequently had seizure-like activity twice, and had a second syncopal episode in the presence of EMS.  I reviewed the past medical records in epic.  The patient was admitted in February with a similar episode of syncope (although apparently no seizure at that time) and significant hypotension.  She was found to be in acute renal failure and was admitted for IV hydration and work-up.  Since then she has had a few ED visits for weakness or syncope with negative work-ups and was discharged home.  On exam, the patient is sleepy appearing but when I speak to her she is oriented x4 and able to answer all of my questions.  Her blood pressure is very low.  Her other vital signs are normal.  She demonstrates good peripheral perfusion.  Neurologic exam is nonfocal and there is no evidence of trauma.  Differential includes vasovagal syncope, seizure with postictal state, infection/sepsis, dehydration, acute renal failure.  There is no evidence of any bleeding although the patient does have a history of prior GI bleeds.  We will give a fluid bolus, obtain a CT head, chest x-ray, lab work-up, and reassess.  ----------------------------------------- 5:52 PM on  08/26/2019 -----------------------------------------  Blood pressure has improved to the 90s over 50s after fluids.  CT head, chest x-ray, and lab work-up are within normal limits and do not reveal a specific etiology of the patient's syncope and hypotension.  Given her persistently low blood pressure I will admit her for further monitoring and work-up.  I  discussed the case with the hospitalist to arrange for admission to the hospitalist service.  __________________________  Barnabas HarriesLeomia W Walker was evaluated in Emergency Department on 08/26/2019 for the symptoms described in the history of present illness. She was evaluated in the context of the global COVID-19 pandemic, which necessitated consideration that the patient might be at risk for infection with the SARS-CoV-2 virus that causes COVID-19. Institutional protocols and algorithms that pertain to the evaluation of patients at risk for COVID-19 are in a state of rapid change based on information released by regulatory bodies including the CDC and federal and state organizations. These policies and algorithms were followed during the patient's care in the ED.  ____________________________________________   FINAL CLINICAL IMPRESSION(S) / ED DIAGNOSES  Final diagnoses:  Syncope and collapse  Hypotension, unspecified hypotension type      NEW MEDICATIONS STARTED DURING THIS VISIT:  New Prescriptions   No medications on file     Note:  This document was prepared using Dragon voice recognition software and may include unintentional dictation errors.    Dionne BucySiadecki, Jaciel Diem, MD 08/26/19 1754

## 2019-08-26 NOTE — H&P (Addendum)
History and Physical:    Amanda [name]   UJW:[id number] DOB: [birth year] DOA: 08/26/2019  Referring MD/provider: Dr. Dionne BucySebastian Siadecki PCP: Emogene MorganAycock, Ngwe A, MD   Patient coming from: Home  Chief Complaint: syncope  History of Present Illness:   Amanda [name] is an 53 y.o. female with multiple medical problems including obesity, hypertension, type 2 diabetes mellitus, history of hypotension, history of dehydration leading to acute kidney injury, GI bleed, asthma, chronic diastolic CHF, stroke, seizure disorder (she said she has not had any seizures for over 5 years).  She presented to the hospital after syncopal episode at home.  She said she got up to use the restroom and she felt dizzy and passed out.  When EMS arrived, they try to get her up but she felt dizzy and passed out again.  She said her blood pressure was very low when EMS checked her blood pressure at home.  She said she had a similar episode in February 2020 when she had syncopal episode.  She said at that time she was told that she was dehydrated and hypotensive and it had led to acute kidney injury.  She said even though she is a known hypertensive, sometimes her blood pressure runs low.  She said she normally has to drink about 2 L of water a day to prevent her from becoming hypotensive.  She said "my doctors are trying to work on that".  She also said that her blood pressure was 154/100 yesterday.  She has no other complaints.  No cough, shortness of breath, chest pain, palpitations, fever, chills, vomiting, diarrhea, abdominal pain.  ED Course:  The patient was hypotensive with a blood pressure 57/41.  She was given 2 L of IV normal saline in the emergency room.  Lactic acid was normal.  CT head and chest x-ray were unremarkable.  Troponin was negative.  Flu test and coronavirus test were negative.  ROS:   ROS other systems reviewed were negative.  Past Medical History:   Past Medical History:  Diagnosis Date  .  Arthritis    knees, shoulder, upper back  . Asthma   . CHF (congestive heart failure) (HCC)   . Diabetes mellitus without complication (HCC)   . Environmental allergies   . Fibromyalgia   . GERD (gastroesophageal reflux disease)   . GI bleed 10/02/2018  . Headache    migraines - 5x/mo  . Hypertension   . Hypokalemia 10/14/2017  . Migraines   . Motion sickness    ships  . Sleep apnea   . Stroke [facility](HCC)    no residual deficits  . Thyroid nodule    bilateral and goiter  . Wears contact lenses   . Wears dentures    partial upper    Past Surgical History:   Past Surgical History:  Procedure Laterality Date  . ABDOMINAL HYSTERECTOMY  2000's  . ABDOMINAL SURGERY     laparoscopy x4 with lysis of adhesions  . APPENDECTOMY  1986  . CESAREAN SECTION  1992  . CHOLECYSTECTOMY N/A 04/03/2017   Procedure: LAPAROSCOPIC CHOLECYSTECTOMY;  Surgeon: Henrene DodgePiscoya, Jose, MD;  Location: [facility] ORS;  Service: General;  Laterality: N/A;  . COLONOSCOPY N/A 10/06/2018   Procedure: COLONOSCOPY;  Surgeon: Toney ReilVanga, Rohini Reddy, MD;  [facility];  Service: Gastroenterology;  Laterality: N/A;  . COLONOSCOPY WITH PROPOFOL N/A 03/10/2017   Procedure: COLONOSCOPY WITH PROPOFOL;  Surgeon: Scot JunElliott, Robert T, MD;  [facility];  Service: Endoscopy;  Laterality: N/A;  .  ECTOPIC PREGNANCY SURGERY  2000's  . ESOPHAGOGASTRODUODENOSCOPY (EGD) WITH PROPOFOL N/A 03/10/2017   Procedure: ESOPHAGOGASTRODUODENOSCOPY (EGD) WITH PROPOFOL;  Surgeon: Scot Jun, MD;  Location: [facility] ENDOSCOPY;  Service: Endoscopy;  Laterality: N/A;  . HERNIA REPAIR    . JOINT REPLACEMENT Right 12/16/12   knee- medial - makoplasty  . KNEE ARTHROSCOPY Right 2006   partial medial and lateral meniscectomies  . KNEE ARTHROSCOPY Right 10/06/2015   Procedure: RIGHT KNEE ARTHROSCOPY WITH DEBRIDEMENT;  Surgeon: Erin Sons, MD;  Location: [facility];  Service: Orthopedics;  Laterality: Right;  Diabetic - insulin and oral  meds  . ROTATOR CUFF REPAIR Left 2006  . TUBAL LIGATION  2000's    Social History:   Social History   Socioeconomic History  . Marital status: Divorced    Spouse name: Not on file  . Number of children: Not on file  . Years of education: Not on file  . Highest education level: Not on file  Occupational History  . Not on file  Tobacco Use  . Smoking status: Never Smoker  . Smokeless tobacco: Never Used  Substance and Sexual Activity  . Alcohol use: No  . Drug use: No  . Sexual activity: Yes    Birth control/protection: Surgical  Other Topics Concern  . Not on file  Social History Narrative   Lives at home with son (who is mentally ill)   Ambulates independently.   Social Determinants of Health   Financial Resource Strain:   . Difficulty of Paying Living Expenses: Not on file  Food Insecurity:   . Worried About Programme researcher, broadcasting/film/video in the Last Year: Not on file  . Ran Out of Food in the Last Year: Not on file  Transportation Needs:   . Lack of Transportation (Medical): Not on file  . Lack of Transportation (Non-Medical): Not on file  Physical Activity:   . Days of Exercise per Week: Not on file  . Minutes of Exercise per Session: Not on file  Stress:   . Feeling of Stress : Not on file  Social Connections:   . Frequency of Communication with Friends and Family: Not on file  . Frequency of Social Gatherings with Friends and Family: Not on file  . Attends Religious Services: Not on file  . Active Member of Clubs or Organizations: Not on file  . Attends Banker Meetings: Not on file  . Marital Status: Not on file  Intimate Partner Violence:   . Fear of Current or Ex-Partner: Not on file  . Emotionally Abused: Not on file  . Physically Abused: Not on file  . Sexually Abused: Not on file    Allergies   Penicillins and Ace inhibitors  Family history:   Family History  Problem Relation Age of Onset  . Hypertension Mother   . Hypertension Father    . Diabetes Father   . Allergies Father   . Kidney disease Father   . Breast cancer Neg Hx     Current Medications:   Prior to Admission medications   Medication Sig Start Date End Date Taking? Authorizing Provider  albuterol (PROVENTIL HFA) 108 (90 Base) MCG/ACT inhaler Inhale 2 puffs into the lungs every 4 (four) hours as needed for wheezing or shortness of breath. 07/13/19  Yes Parrett, Tammy S, NP  Ascorbic Acid (VITAMIN C) 1000 MG tablet Take 500 mg by mouth 2 (two) times daily.    Yes [provider]  DULoxetine (CYMBALTA) 60 MG  capsule Take 60 mg by mouth 2 (two) times daily.   Yes [provider]  flunisolide (NASALIDE) 25 MCG/ACT (0.025%) SOLN Place 2 sprays into the nose daily as needed.   Yes [provider]  fluticasone furoate-vilanterol (BREO ELLIPTA) 200-25 MCG/INH AEPB Inhale 1 puff into the lungs daily. 1 puff daily. 07/14/19  Yes Parrett, Tammy S, NP  furosemide (LASIX) 40 MG tablet Take 40 mg by mouth 2 (two) times daily.  10/16/18  Yes [provider]  gabapentin (NEURONTIN) 300 MG capsule Take 600 mg by mouth 2 (two) times daily. 03/22/18  Yes [provider]  ipratropium-albuterol (DUONEB) 0.5-2.5 (3) MG/3ML SOLN Inhale 3 mLs into the lungs 3 (three) times daily as needed (shortness of breath, wheezing or cough). 07/13/19  Yes Parrett, Tammy S, NP  isosorbide mononitrate (IMDUR) 30 MG 24 hr tablet Take 1 tablet (30 mg total) by mouth daily. 10/17/17  Yes Johnson, Maylene Roes, MD  LINZESS 290 MCG CAPS capsule Take 1 tablet by mouth daily before breakfast.  02/28/17  Yes [provider]  liraglutide (VICTOZA) 18 MG/3ML SOPN Inject into the skin daily.   Yes [provider]  losartan (COZAAR) 100 MG tablet Take 100 mg by mouth daily.   Yes [provider]  metoprolol succinate (TOPROL-XL) 100 MG 24 hr tablet Take 100 mg by mouth daily.    Yes [provider]  mirtazapine (REMERON SOL-TAB) 30 MG  disintegrating tablet Take 30 mg by mouth daily.  03/18/18  Yes [provider]  montelukast (SINGULAIR) 10 MG tablet Take 1 tablet (10 mg total) by mouth at bedtime. 07/13/19 07/12/20 Yes Parrett, Tammy S, NP  Multiple Vitamin (MULTIVITAMIN) tablet Take 1 tablet by mouth daily.   Yes [provider]  omeprazole (PRILOSEC) 40 MG capsule Take 1 capsule (40 mg total) by mouth daily. 1 hr before supper 04/05/17  Yes Piscoya, Jose, MD  Potassium Chloride ER 20 MEQ TBCR Take 20 mEq by mouth daily. Take with lasix 09/30/18  Yes [provider]  pravastatin (PRAVACHOL) 80 MG tablet Take 1 tablet (80 mg total) by mouth daily. 10/17/17 08/26/19 Yes Johnson, Clanford L, MD  senna-docusate (SENOKOT-S) 8.6-50 MG tablet Take 1 tablet by mouth 2 (two) times daily. 10/06/18  Yes Altamese Dilling, MD  sitaGLIPtin (JANUVIA) 25 MG tablet Take 25 mg by mouth daily.   Yes [provider]  SUMAtriptan (IMITREX) 25 MG tablet Take 25 mg by mouth every 2 (two) hours as needed for migraine. May repeat in 2 hours if headache persists or recurs.   Yes [provider]  tiotropium (SPIRIVA HANDIHALER) 18 MCG inhalation capsule Place 1 capsule (18 mcg total) into inhaler and inhale daily. 07/13/19 10/11/19 Yes Parrett, Tammy S, NP  tiZANidine (ZANAFLEX) 4 MG tablet Take 4 mg by mouth every 8 (eight) hours.    Yes [provider]  topiramate (TOPAMAX) 50 MG tablet Take 50 mg by mouth 2 (two) times daily.  03/16/19  Yes [provider]  Vitamin D, Ergocalciferol, (DRISDOL) 1.25 MG (50000 UT) CAPS capsule Take 1 capsule (50,000 Units total) by mouth every 7 (seven) days. 08/09/19  Yes Vanga, Loel Dubonnet, MD  zolpidem (AMBIEN) 10 MG tablet Take 10 mg by mouth at bedtime.    Yes [provider]    Physical Exam:   Vitals:   08/26/19 1625 08/26/19 1630 08/26/19 1635 08/26/19 1645  BP: (!) 89/59 (!) 90/56 (!) 91/52 (!) 91/54  Pulse: 72 72 70 75  Resp: 17 (!) 21 20  16   SpO2: 100% 100% 100% 100%  Weight:      Height:         Physical Exam: Blood pressure (!) 91/54, pulse 75, resp. rate 16, height 5\' 5"  (1.651 m), weight 93 kg, SpO2 100 %. Gen: No acute distress. Head: Normocephalic, atraumatic. Eyes: Pupils equal, round and reactive to light. Extraocular movements intact.  Sclerae nonicteric.  Mouth: Dry mucous membranes. Neck: Supple, no thyromegaly, no lymphadenopathy, no jugular venous distention. Chest: Lungs are clear to auscultation with good air movement. No rales, rhonchi or wheezes.  CV: Heart sounds are regular with an S1, S2. No murmurs, rubs, clicks, or gallops. Abdomen: Soft, nontender, obese with normal active bowel sounds. No palpable masses. Extremities: Extremities are without clubbing, or cyanosis. No edema. Pedal pulses 2+.  Skin: Warm and dry. No rashes, lesions or wounds. Neuro: Alert and oriented times 3; grossly nonfocal.  Psych: Insight is good and judgment is appropriate. Mood and affect normal.   Data Review:    Labs: Basic Metabolic Panel: Recent Labs  Lab 08/26/19 1525  NA 136  K 4.3  CL 102  CO2 22  GLUCOSE 273*  BUN 15  CREATININE 1.21*  CALCIUM 8.9   Liver Function Tests: Recent Labs  Lab 08/26/19 1525  AST 103*  ALT 73*  ALKPHOS 102  BILITOT 0.7  PROT 6.7  ALBUMIN 3.6   No results for input(s): LIPASE, AMYLASE in the last 168 hours. No results for input(s): AMMONIA in the last 168 hours. CBC: Recent Labs  Lab 08/26/19 1525  WBC 4.8  NEUTROABS 2.5  HGB 12.6  HCT 39.5  MCV 94.3  PLT 271   Cardiac Enzymes: No results for input(s): CKTOTAL, CKMB, CKMBINDEX, TROPONINI in the last 168 hours.  BNP (last 3 results) No results for input(s): PROBNP in the last 8760 hours. CBG: Recent Labs  Lab 08/26/19 1521  GLUCAP 255*    Urinalysis    Component Value Date/Time   COLORURINE STRAW (A) 11/14/2018 1505   APPEARANCEUR CLEAR (A) 11/14/2018 1505   APPEARANCEUR Cloudy 11/12/2011  1721   LABSPEC 1.012 11/14/2018 1505   LABSPEC 1.010 11/12/2011 1721   PHURINE 5.0 11/14/2018 1505   GLUCOSEU >=500 (A) 11/14/2018 1505   GLUCOSEU Negative 11/12/2011 1721   HGBUR NEGATIVE 11/14/2018 1505   BILIRUBINUR NEGATIVE 11/14/2018 1505   BILIRUBINUR Negative 11/12/2011 1721   KETONESUR NEGATIVE 11/14/2018 1505   PROTEINUR NEGATIVE 11/14/2018 1505   NITRITE NEGATIVE 11/14/2018 1505   LEUKOCYTESUR NEGATIVE 11/14/2018 1505   LEUKOCYTESUR 1+ 11/12/2011 1721      Radiographic Studies: CT Head Wo Contrast  Result Date: 08/26/2019 CLINICAL DATA:  Seizure-like activity, syncopal episode, fell striking head EXAM: CT HEAD WITHOUT CONTRAST TECHNIQUE: Contiguous axial images were obtained from the base of the skull through the vertex without intravenous contrast. Sagittal and coronal MPR images reconstructed from axial data set. COMPARISON:  11/14/2018 FINDINGS: Brain: Normal ventricular morphology. No midline shift or mass effect. Normal appearance of brain parenchyma. No intracranial hemorrhage, mass lesion, or evidence of acute infarction. No extra-axial fluid collections. Scattered beam hardening artifacts at skull base. Vascular: No hyperdense vessels Skull: Intact Sinuses/Orbits: Clear Other: N/A IMPRESSION: Normal exam. Electronically Signed   By: Lavonia Dana M.D.   On: 08/26/2019 16:20   DG Chest Portable 1 View  Result Date: 08/26/2019 CLINICAL DATA:  Syncopal episode, fell and struck head against door frame, question seizure like activity, hypertension, diabetes mellitus, CHF, asthma  EXAM: PORTABLE CHEST 1 VIEW COMPARISON:  Portable exam 1518 hours compared to 07/13/2019 FINDINGS: Enlargement of cardiac silhouette. Mediastinal contours and pulmonary vascularity normal. Decreased lung volumes. No definite infiltrate, pleural effusion or pneumothorax. Minimal chronic peribronchial thickening. Bones unremarkable. IMPRESSION: Enlargement of cardiac silhouette. Minimal chronic bronchitic  changes without infiltrate. Electronically Signed   By: Ulyses Southward M.D.   On: 08/26/2019 15:53    EKG: Independently reviewed.  Normal sinus rhythm, low voltage, no acute ST-T changes   Assessment/Plan:   Active Problems:   Hypotension   Syncope and collapse  Body mass index is 34.11 kg/m.  (Obesity)  Hypotension: Admit to MedSurg with telemetry.  Hydrate with IV fluids.  Hold antihypertensives (isosorbide mononitrate, losartan, Lasix, metoprolol).  No evidence of infection thus far.  Syncope: Likely due to hypotension.  Migraine headaches/seizure disorder: On Topamax  Chronic diastolic CHF: Compensated.  Hold Lasix because of hypotension  Asthma: Compensated.  Continue bronchodilators.  CKD stage III: Creatinine is stable.  History of hypertension: Hold antihypertensives.  Type 2 diabetes mellitus: Liraglutide and sitagliptin are nonformulary.  NovoLog as needed for hyperglycemia.  Fibromyalgia: Continue Cymbalta  History of stroke: Patient is not on any antiplatelet or anticoagulant.  She said she does not know why she is not on any medications for that.   Other information:   DVT prophylaxis: Lovenox Code Status: Full code. Family Communication: Plan discussed with the patient Disposition Plan: Possible discharge to home in 1 to 2 days Consults called: None Admission status: Observation  The medical decision making on this patient was of moderate complexity  therefore this is a level 2 visit.    Time spent  Lurene Shadow [facility]   How to contact the [facility] Attending or Consulting provider 7A - 7P or covering provider during after hours 7P -7A, for this patient?   1. Check the care team in [facility] and look for a) attending/consulting [facility] provider listed and b) the [facility] team listed 2. Log into [email or web address] and use [place]'s universal password to access. If you do not have the password, please contact the hospital operator. 3. Locate the [facility]  provider you are looking for under [facility] and page to a number that you can be directly reached. 4. If you still have difficulty reaching the provider, please page the [facility] (Director on Call) for the Hospitalists listed on amion for assistance.  08/26/2019, 5:43 PM

## 2019-08-26 NOTE — ED Notes (Signed)
TV turned on per pt request. Given another warm blanket.

## 2019-08-26 NOTE — ED Triage Notes (Addendum)
Pt arrives via ems from home. Ems reports pt got up to restroom and passed out, fell and hit head against door frame. Family reported seizure like activity. Ems arrived and when attempting to move pt from chair to stretcher pt loss consciousness again and appeared to have a focal seizure. Pt lethargic on arrival but alert enough to answer questions appropriately, and follow commands. MD present for assessments.  EMS vitals BP 60/40 HR 70 CBG 300

## 2019-08-26 NOTE — ED Notes (Signed)
Pt daughter updated at this time by the nurse and also spoke directly to the patient

## 2019-08-27 DIAGNOSIS — R55 Syncope and collapse: Secondary | ICD-10-CM | POA: Diagnosis not present

## 2019-08-27 DIAGNOSIS — I952 Hypotension due to drugs: Secondary | ICD-10-CM | POA: Diagnosis not present

## 2019-08-27 LAB — CBC
HCT: 36.6 % (ref 36.0–46.0)
Hemoglobin: 12 g/dL (ref 12.0–15.0)
MCH: 30.2 pg (ref 26.0–34.0)
MCHC: 32.8 g/dL (ref 30.0–36.0)
MCV: 92 fL (ref 80.0–100.0)
Platelets: 247 10*3/uL (ref 150–400)
RBC: 3.98 MIL/uL (ref 3.87–5.11)
RDW: 14.4 % (ref 11.5–15.5)
WBC: 5.6 10*3/uL (ref 4.0–10.5)
nRBC: 0 % (ref 0.0–0.2)

## 2019-08-27 LAB — BASIC METABOLIC PANEL
Anion gap: 7 (ref 5–15)
BUN: 15 mg/dL (ref 6–20)
CO2: 25 mmol/L (ref 22–32)
Calcium: 8.7 mg/dL — ABNORMAL LOW (ref 8.9–10.3)
Chloride: 108 mmol/L (ref 98–111)
Creatinine, Ser: 0.89 mg/dL (ref 0.44–1.00)
GFR calc Af Amer: >60 mL/min (ref >60)
GFR calc non Af Amer: >60 mL/min (ref >60)
Glucose, Bld: 206 mg/dL — ABNORMAL HIGH (ref 70–99)
Potassium: 4 mmol/L (ref 3.5–5.1)
Sodium: 140 mmol/L (ref 135–145)

## 2019-08-27 LAB — HIV ANTIBODY (ROUTINE TESTING W REFLEX): HIV Screen 4th Generation wRfx: NONREACTIVE

## 2019-08-27 LAB — GLUCOSE, CAPILLARY: Glucose-Capillary: 142 mg/dL — ABNORMAL HIGH (ref 70–99)

## 2019-08-27 MED ORDER — FUROSEMIDE 40 MG PO TABS: 40.0000 mg | ORAL_TABLET | Freq: Every day | ORAL | Status: DC

## 2019-08-27 MED ORDER — IBUPROFEN 400 MG PO TABS
600.0000 mg | ORAL_TABLET | Freq: Once | ORAL | Status: AC
  Administered 2019-08-27: 600 mg via ORAL
  Filled 2019-08-27: qty 2

## 2019-08-27 MED ORDER — ACETAMINOPHEN 325 MG PO TABS: 650.0000 mg | ORAL_TABLET | Freq: Four times a day (QID) | ORAL | Status: DC | PRN

## 2019-08-27 MED ORDER — INSULIN ASPART 100 UNIT/ML ~~LOC~~ SOLN
0.0000 [IU] | Freq: Three times a day (TID) | SUBCUTANEOUS | Status: DC
  Administered 2019-08-27: 2 [IU] via SUBCUTANEOUS
  Filled 2019-08-27: qty 1

## 2019-08-27 NOTE — Discharge Summary (Signed)
Physician Discharge Summary  Amanda [name] QQV:[id number] DOB: 10-20-1965 DOA: 08/26/2019  PCP: Donnie Coffin, MD  Admit date: 08/26/2019 Discharge date: 08/27/2019  Discharge disposition: Home   Recommendations for Outpatient Follow-Up:   1. Outpatient follow-up within 1 week of discharge   Discharge Diagnosis:   Active Problems:   Hypotension   Syncope and collapse   Syncope    Discharge Condition: Stable.  Diet recommendation: Low-salt and diabetic diet diet  Code status: Full code    Hospital Course:   MS. Amanda [name] is a 53 year old woman with multiple medical problems including obesity, hypertension, type 2 diabetes mellitus, history of hypotension, history of dehydration leading to acute kidney injury, GI bleed, asthma, chronic diastolic CHF, stroke, seizure disorder (she said she has not had any seizures for over 5 years).  She presented to the hospital after syncopal episode at home.  She said she got up to use the restroom and she felt dizzy and passed out.  When EMS arrived, they try to get her up but she felt dizzy and passed out again.  She said her blood pressure was very low when EMS checked her blood pressure at home.  She said she had a similar episode in February 2020 when she had syncopal episode.  She said at that time she was told that she was dehydrated and hypotensive and it had led to acute kidney injury.  She said even though she is a known hypertensive, sometimes her blood pressure runs low.  She was hypotensive in the emergency room with blood pressure 57/41.  All her antihypertensives were held.  She was given 2 L of normal saline followed by maintenance fluids with IV normal saline.  Her blood pressure improved to 136/83. Metoprolol, losartan and isosorbide mononitrate has been discontinued at discharge.  Lasix will be resumed at discharge but has been decreased to 40 mg daily.  She complained of pain in the left knee the day after  admission.  Pain is likely from the fall when she passed out.  She was able to ambulate in the hallway without any oxygen desaturation or shortness of breath.  However, she was bothered by the pain in the left knee and requested a walker to make ambulation easier.  Her condition has improved and she is deemed stable for discharge to home today.  She has to follow-up with her primary care physician for antihypertensives to be adjusted in the outpatient setting based on her blood pressure readings.         Discharge Exam:   Vitals:   08/27/19 0000 08/27/19 0541  BP: 102/67 110/71  Pulse: 65 66  Resp: 17 18  Temp: 98.1 F (36.7 C) 98 F (36.7 C)  SpO2: 98% 99%   Vitals:   08/26/19 1851 08/26/19 2127 08/27/19 0000 08/27/19 0541  BP: 103/68 (!) 99/43 102/67 110/71  Pulse: 72 77 65 66  Resp: 16 17 17 18   Temp: 97.9 F (36.6 C) 97.8 F (36.6 C) 98.1 F (36.7 C) 98 F (36.7 C)  TempSrc: Oral Oral Oral Oral  SpO2: 100% 100% 98% 99%  Weight:      Height:         GEN: NAD SKIN: No rash EYES: EOMI ENT: MMM CV: RRR PULM: CTA B ABD: soft, ND, NT, +BS CNS: AAO x 3, non focal EXT: Mild tenderness of the left knee without swelling or erythema.   The results of significant diagnostics from this hospitalization (including imaging, microbiology,  ancillary and laboratory) are listed below for reference.     Procedures and Diagnostic Studies:   CT Head Wo Contrast  Result Date: 08/26/2019 CLINICAL DATA:  Seizure-like activity, syncopal episode, fell striking head EXAM: CT HEAD WITHOUT CONTRAST TECHNIQUE: Contiguous axial images were obtained from the base of the skull through the vertex without intravenous contrast. Sagittal and coronal MPR images reconstructed from axial data set. COMPARISON:  11/14/2018 FINDINGS: Brain: Normal ventricular morphology. No midline shift or mass effect. Normal appearance of brain parenchyma. No intracranial hemorrhage, mass lesion, or evidence of  acute infarction. No extra-axial fluid collections. Scattered beam hardening artifacts at skull base. Vascular: No hyperdense vessels Skull: Intact Sinuses/Orbits: Clear Other: N/A IMPRESSION: Normal exam. Electronically Signed   By: Ulyses Southward M.D.   On: 08/26/2019 16:20   DG Chest Portable 1 View  Result Date: 08/26/2019 CLINICAL DATA:  Syncopal episode, fell and struck head against door frame, question seizure like activity, hypertension, diabetes mellitus, CHF, asthma EXAM: PORTABLE CHEST 1 VIEW COMPARISON:  Portable exam 1518 hours compared to 07/13/2019 FINDINGS: Enlargement of cardiac silhouette. Mediastinal contours and pulmonary vascularity normal. Decreased lung volumes. No definite infiltrate, pleural effusion or pneumothorax. Minimal chronic peribronchial thickening. Bones unremarkable. IMPRESSION: Enlargement of cardiac silhouette. Minimal chronic bronchitic changes without infiltrate. Electronically Signed   By: Ulyses Southward M.D.   On: 08/26/2019 15:53     Labs:   Basic Metabolic Panel: Recent Labs  Lab 08/26/19 1525 08/27/19 0438  NA 136 140  K 4.3 4.0  CL 102 108  CO2 22 25  GLUCOSE 273* 206*  BUN 15 15  CREATININE 1.21* 0.89  CALCIUM 8.9 8.7*   GFR Estimated Creatinine Clearance: 81.5 mL/min (by C-G formula based on SCr of 0.89 mg/dL). Liver Function Tests: Recent Labs  Lab 08/26/19 1525  AST 103*  ALT 73*  ALKPHOS 102  BILITOT 0.7  PROT 6.7  ALBUMIN 3.6   No results for input(s): LIPASE, AMYLASE in the last 168 hours. No results for input(s): AMMONIA in the last 168 hours. Coagulation profile No results for input(s): INR, PROTIME in the last 168 hours.  CBC: Recent Labs  Lab 08/26/19 1525 08/27/19 0438  WBC 4.8 5.6  NEUTROABS 2.5  --   HGB 12.6 12.0  HCT 39.5 36.6  MCV 94.3 92.0  PLT 271 247   Cardiac Enzymes: No results for input(s): CKTOTAL, CKMB, CKMBINDEX, TROPONINI in the last 168 hours. BNP: Invalid input(s): POCBNP CBG: Recent Labs    Lab 08/26/19 1521 08/27/19 1310  GLUCAP 255* 142*   D-Dimer No results for input(s): DDIMER in the last 72 hours. Hgb A1c No results for input(s): HGBA1C in the last 72 hours. Lipid Profile No results for input(s): CHOL, HDL, LDLCALC, TRIG, CHOLHDL, LDLDIRECT in the last 72 hours. Thyroid function studies No results for input(s): TSH, T4TOTAL, T3FREE, THYROIDAB in the last 72 hours.  Invalid input(s): FREET3 Anemia work up No results for input(s): VITAMINB12, FOLATE, FERRITIN, TIBC, IRON, RETICCTPCT in the last 72 hours. Microbiology Recent Results (from the past 240 hour(s))  Respiratory Panel by RT PCR (Flu A&B, Covid) - Nasopharyngeal Swab     Status: None   Collection Time: 08/26/19  3:47 PM   Specimen: Nasopharyngeal Swab  Result Value Ref Range Status   SARS Coronavirus 2 by RT PCR NEGATIVE NEGATIVE Final    Comment: (NOTE) SARS-CoV-2 target nucleic acids are NOT DETECTED. The SARS-CoV-2 RNA is generally detectable in upper respiratoy specimens during the acute phase of  infection. The lowest concentration of SARS-CoV-2 viral copies this assay can detect is 131 copies/mL. A negative result does not preclude SARS-Cov-2 infection and should not be used as the sole basis for treatment or other patient management decisions. A negative result may occur with  improper specimen collection/handling, submission of specimen other than nasopharyngeal swab, presence of viral mutation(s) within the areas targeted by this assay, and inadequate number of viral copies (<131 copies/mL). A negative result must be combined with clinical observations, patient history, and epidemiological information. The expected result is Negative. Fact Sheet for Patients:  https://www.moore.com/https://www.fda.gov/media/142436/download Fact Sheet for Healthcare Providers:  https://www.young.biz/https://www.fda.gov/media/142435/download This test is not yet ap proved or cleared by the [place] FDA and  has been authorized for detection  and/or diagnosis of SARS-CoV-2 by FDA under an Emergency Use Authorization (EUA). This EUA will remain  in effect (meaning this test can be used) for the duration of the COVID-19 declaration under Section 564(b)(1) of the Act, 21 U.S.C. section 360bbb-3(b)(1), unless the authorization is terminated or revoked sooner.    Influenza A by PCR NEGATIVE NEGATIVE Final   Influenza B by PCR NEGATIVE NEGATIVE Final    Comment: (NOTE) The Xpert Xpress SARS-CoV-2/FLU/RSV assay is intended as an aid in  the diagnosis of influenza from Nasopharyngeal swab specimens and  should not be used as a sole basis for treatment. Nasal washings and  aspirates are unacceptable for Xpert Xpress SARS-CoV-2/FLU/RSV  testing. Fact Sheet for Patients: https://www.moore.com/https://www.fda.gov/media/142436/download Fact Sheet for Healthcare Providers: https://www.young.biz/https://www.fda.gov/media/142435/download This test is not yet approved or cleared by the [place] FDA and  has been authorized for detection and/or diagnosis of SARS-CoV-2 by  FDA under an Emergency Use Authorization (EUA). This EUA will remain  in effect (meaning this test can be used) for the duration of the  Covid-19 declaration under Section 564(b)(1) of the Act, 21  U.S.C. section 360bbb-3(b)(1), unless the authorization is  terminated or revoked. Performed at [facility], [address] [place], [place] [address]      Discharge Instructions:   Discharge Instructions    Diet - low sodium heart healthy   Complete by: As directed    Diet Carb Modified   Complete by: As directed    Increase activity slowly   Complete by: As directed    Walker rolling   Complete by: As directed      Allergies as of 08/27/2019      Reactions   Penicillins Anaphylaxis   Has patient had a PCN reaction causing immediate rash, facial/tongue/throat swelling, SOB or lightheadedness with hypotension: Yes Has patient had a PCN reaction causing severe rash involving mucus  membranes or skin necrosis: No Has patient had a PCN reaction that required hospitalization No Has patient had a PCN reaction occurring within the last 10 years: No If all of the above answers are "NO", then may proceed with Cephalosporin use.   Ace Inhibitors Swelling      Medication List    STOP taking these medications   isosorbide mononitrate 30 MG 24 hr tablet Commonly known as: IMDUR   losartan 100 MG tablet Commonly known as: COZAAR   metoprolol succinate 100 MG 24 hr tablet Commonly known as: TOPROL-XL     TAKE these medications   acetaminophen 325 MG tablet Commonly known as: TYLENOL Take 2 tablets (650 mg total) by mouth every 6 (six) hours as needed for mild pain (or Fever >/= 101).   albuterol 108 (90 Base) MCG/ACT inhaler Commonly known  as: Proventil HFA Inhale 2 puffs into the lungs every 4 (four) hours as needed for wheezing or shortness of breath.   Breo Ellipta 200-25 MCG/INH Aepb Generic drug: fluticasone furoate-vilanterol Inhale 1 puff into the lungs daily. 1 puff daily.   DULoxetine 60 MG capsule Commonly known as: CYMBALTA Take 60 mg by mouth 2 (two) times daily.   flunisolide 25 MCG/ACT (0.025%) Soln Commonly known as: NASALIDE Place 2 sprays into the nose daily as needed.   furosemide 40 MG tablet Commonly known as: LASIX Take 1 tablet (40 mg total) by mouth daily. What changed: when to take this   gabapentin 300 MG capsule Commonly known as: NEURONTIN Take 600 mg by mouth 2 (two) times daily.   ipratropium-albuterol 0.5-2.5 (3) MG/3ML Soln Commonly known as: DUONEB Inhale 3 mLs into the lungs 3 (three) times daily as needed (shortness of breath, wheezing or cough).   Linzess 290 MCG Caps capsule Generic drug: linaclotide Take 1 tablet by mouth daily before breakfast.   liraglutide 18 MG/3ML Sopn Commonly known as: VICTOZA Inject into the skin daily.   mirtazapine 30 MG disintegrating tablet Commonly known as: REMERON  SOL-TAB Take 30 mg by mouth daily.   montelukast 10 MG tablet Commonly known as: Singulair Take 1 tablet (10 mg total) by mouth at bedtime.   multivitamin tablet Take 1 tablet by mouth daily.   omeprazole 40 MG capsule Commonly known as: PRILOSEC Take 1 capsule (40 mg total) by mouth daily. 1 hr before supper   Potassium Chloride ER 20 MEQ Tbcr Take 20 mEq by mouth daily. Take with lasix   pravastatin 80 MG tablet Commonly known as: PRAVACHOL Take 1 tablet (80 mg total) by mouth daily.   senna-docusate 8.6-50 MG tablet Commonly known as: Senokot-S Take 1 tablet by mouth 2 (two) times daily.   sitaGLIPtin 25 MG tablet Commonly known as: JANUVIA Take 25 mg by mouth daily.   Spiriva HandiHaler 18 MCG inhalation capsule Generic drug: tiotropium Place 1 capsule (18 mcg total) into inhaler and inhale daily.   SUMAtriptan 25 MG tablet Commonly known as: IMITREX Take 25 mg by mouth every 2 (two) hours as needed for migraine. May repeat in 2 hours if headache persists or recurs.   tiZANidine 4 MG tablet Commonly known as: ZANAFLEX Take 4 mg by mouth every 8 (eight) hours.   topiramate 50 MG tablet Commonly known as: TOPAMAX Take 50 mg by mouth 2 (two) times daily.   vitamin C 1000 MG tablet Take 500 mg by mouth 2 (two) times daily.   Vitamin D (Ergocalciferol) 1.25 MG (50000 UT) Caps capsule Commonly known as: DRISDOL Take 1 capsule (50,000 Units total) by mouth every 7 (seven) days.   zolpidem 10 MG tablet Commonly known as: AMBIEN Take 10 mg by mouth at bedtime.         Time coordinating discharge: 28 minutes  Signed:  Ival Basquez  [facility] 08/27/2019, 2:24 PM

## 2019-08-27 NOTE — Care Management Obs Status (Signed)
[facility] NOTIFICATION   Patient Details  Name: Amanda [name] MRN: [id number] Date of Birth: [birth year]   Medicare Observation Status Notification Given:  Yes    Cecil Cobbs 08/27/2019, 2:11 PM

## 2019-08-27 NOTE — TOC Transition Note (Signed)
Transition of Care [facility]) - CM/SW Discharge Note   Patient Details  Name: Amanda [name] MRN: [id number] Date of Birth: Jun 09, 1966  [facility] CM/SW Contact:  Ross Ludwig, LCSW Phone Number: 08/27/2019, 2:20 PM   Clinical Narrative:    CSW was notified by bedside nurse and physician that she will need a walker for discharge home today.  CSW contacted bedside nurse, and explained to her where the walkers are in PACU, to see if the tech or if she is able to get it for the patient before she leaves.  CSW emailed [facility] [place] from [place] to inform them of patient needing walker.  CSW spoke to patient regarding Code 42 and explained that she is now on observation, patient expressed understanding and gave CSW permission to sign Code 44 notification.  CSW signing off, patient will be discharging back home today with a walker.   Final next level of care: Home/Self Care Barriers to Discharge: Barriers Resolved   Patient Goals and CMS Choice Patient states their goals for this hospitalization and ongoing recovery are:: To return back home CMS Medicare.gov Compare Post Acute Care list provided to:: Patient Choice offered to / list presented to : Patient  Discharge Placement Patient discharging back home today.                      Discharge Plan and Services  Patient going home today.              DME Arranged: Gilford Rile rolling DME Agency: [employer] Date DME Agency Contacted: 08/27/19 Time DME Agency Contacted: [phone] Representative spoke with at DME Agency: Emailed [facility] Arranged: NA          Social Determinants of Health ([place]) Interventions     Readmission Risk Interventions No flowsheet data found.

## 2019-08-27 NOTE — Progress Notes (Signed)
Amanda [name] and O x4. VSS. Pt tolerating diet well. No complaints of nausea or vomiting. IV removed intact, prescriptions given. Pt voices understanding of discharge instructions with no further questions. Patient discharged via wheelchair with RN  Allergies as of 08/27/2019      Reactions   Penicillins Anaphylaxis   Has patient had [name] PCN reaction causing immediate rash, facial/tongue/throat swelling, SOB or lightheadedness with hypotension: Yes Has patient had [name] PCN reaction causing severe rash involving mucus membranes or skin necrosis: No Has patient had [name] PCN reaction that required hospitalization No Has patient had [name] PCN reaction occurring within the last 10 years: No If all of the above answers are "NO", then may proceed with Cephalosporin use.   Ace Inhibitors Swelling      Medication List    STOP taking these medications   isosorbide mononitrate 30 MG 24 hr tablet Commonly known as: IMDUR   losartan 100 MG tablet Commonly known as: COZAAR   metoprolol succinate 100 MG 24 hr tablet Commonly known as: TOPROL-XL     TAKE these medications   acetaminophen 325 MG tablet Commonly known as: TYLENOL Take 2 tablets (650 mg total) by mouth every 6 (six) hours as needed for mild pain (or Fever >/= 101).   albuterol 108 (90 Base) MCG/ACT inhaler Commonly known as: Proventil HFA Inhale 2 puffs into the lungs every 4 (four) hours as needed for wheezing or shortness of breath.   Breo Ellipta 200-25 MCG/INH Aepb Generic drug: fluticasone furoate-vilanterol Inhale 1 puff into the lungs daily. 1 puff daily.   DULoxetine 60 MG capsule Commonly known as: CYMBALTA Take 60 mg by mouth 2 (two) times daily.   flunisolide 25 MCG/ACT (0.025%) Soln Commonly known as: NASALIDE Place 2 sprays into the nose daily as needed.   furosemide 40 MG tablet Commonly known as: LASIX Take 1 tablet (40 mg total) by mouth daily. What changed: when to take this   gabapentin 300 MG  capsule Commonly known as: NEURONTIN Take 600 mg by mouth 2 (two) times daily.   ipratropium-albuterol 0.5-2.5 (3) MG/3ML Soln Commonly known as: DUONEB Inhale 3 mLs into the lungs 3 (three) times daily as needed (shortness of breath, wheezing or cough).   Linzess 290 MCG Caps capsule Generic drug: linaclotide Take 1 tablet by mouth daily before breakfast.   liraglutide 18 MG/3ML Sopn Commonly known as: VICTOZA Inject into the skin daily.   mirtazapine 30 MG disintegrating tablet Commonly known as: REMERON SOL-TAB Take 30 mg by mouth daily.   montelukast 10 MG tablet Commonly known as: Singulair Take 1 tablet (10 mg total) by mouth at bedtime.   multivitamin tablet Take 1 tablet by mouth daily.   omeprazole 40 MG capsule Commonly known as: PRILOSEC Take 1 capsule (40 mg total) by mouth daily. 1 hr before supper   Potassium Chloride ER 20 MEQ Tbcr Take 20 mEq by mouth daily. Take with lasix   pravastatin 80 MG tablet Commonly known as: PRAVACHOL Take 1 tablet (80 mg total) by mouth daily.   senna-docusate 8.6-50 MG tablet Commonly known as: Senokot-S Take 1 tablet by mouth 2 (two) times daily.   sitaGLIPtin 25 MG tablet Commonly known as: JANUVIA Take 25 mg by mouth daily.   Spiriva HandiHaler 18 MCG inhalation capsule Generic drug: tiotropium Place 1 capsule (18 mcg total) into inhaler and inhale daily.   SUMAtriptan 25 MG tablet Commonly known as: IMITREX Take 25 mg by mouth every 2 (two) hours  as needed for migraine. May repeat in 2 hours if headache persists or recurs.   tiZANidine 4 MG tablet Commonly known as: ZANAFLEX Take 4 mg by mouth every 8 (eight) hours.   topiramate 50 MG tablet Commonly known as: TOPAMAX Take 50 mg by mouth 2 (two) times daily.   vitamin C 1000 MG tablet Take 500 mg by mouth 2 (two) times daily.   Vitamin D (Ergocalciferol) 1.25 MG (50000 UT) Caps capsule Commonly known as: DRISDOL Take 1 capsule (50,000 Units total) by  mouth every 7 (seven) days.   zolpidem 10 MG tablet Commonly known as: AMBIEN Take 10 mg by mouth at bedtime.       Vitals:   08/27/19 1340 08/27/19 1432  BP: 136/83 (!) 150/81  Pulse:  87  Resp:    Temp:  98.4 F (36.9 C)  SpO2: 100% 98%    Amanda [name]

## 2019-08-27 NOTE — Care Management CC44 (Deleted)
Condition Code 44 Documentation Completed  Patient Details  Name: Amanda [name] MRN: [id number] Date of Birth: April 27, 1966   Condition Code 44 given:  Yes Patient signature on Condition Code 44 notice:  Yes Documentation of 2 MD's agreement:  Yes Code 44 added to claim:  Yes    Ross Ludwig, LCSW 08/27/2019, 2:09 PM

## 2019-08-27 NOTE — Care Management CC44 (Signed)
Condition Code 44 Documentation Completed  Patient Details  Name: Amanda [name] MRN: [id number] Date of Birth: 01/22/1966   Condition Code 44 given:  Yes Patient signature on Condition Code 44 notice:  Yes Documentation of 2 MD's agreement:  Yes Code 44 added to claim:  Yes    Joetta Delprado R, LCSW 08/27/2019, 2:09 PM  

## 2019-09-01 NOTE — Progress Notes (Signed)
Patient ID: Amanda [name], female    DOB: Jan 26, 1966, 53 y.o.   MRN: [id number]  HPI  [name] is a 53 y/o female with a history of asthma, DM, HTN, stroke, thyroid disease, GERD, fibromyalgia, migraines, obstructive sleep apnea and chronic heart failure.   Had echo done 10/04/2018 but unable to view those results. Echo report from 03/29/18 reviewed and showed an EF of 55-60%.  Admitted 08/26/2019 due to syncopal episode after using the bathroom. Given 2L of fluids due to hypotension (57/41) & medications held. Discharged the following day. Admitted 04/16/2019 due to shortness of breath and intermittent confusion. Medications adjusted.  Discharged after 3 days.   She presents today for a follow-up visit with a chief complaint of moderate fatigue upon minimal exertion. She describes this as chronic in nature having been present for several years. She has associated shortness of breath, pedal edema, palpitations, abdominal distention (better), light-headedness and difficulty sleeping along with this. She denies any chest pain, cough or weight gain.   She has had a recent hospitalization due to hypotension and has had her BP medications stopped. Having left knee pain due to a recent fall after her hypotension at home.   Past Medical History:  Diagnosis Date  . Arthritis    knees, shoulder, upper back  . Asthma   . CHF (congestive heart failure) ([place])   . Diabetes mellitus without complication ([place])   . Environmental allergies   . Fibromyalgia   . GERD (gastroesophageal reflux disease)   . GI bleed 10/02/2018  . Headache    migraines - 5x/mo  . Hypertension   . Hypokalemia 10/14/2017  . Migraines   . Motion sickness    ships  . Sleep apnea   . Stroke [facility])    no residual deficits  . Thyroid nodule    bilateral and goiter  . Wears contact lenses   . Wears dentures    partial upper   Past Surgical History:  Procedure Laterality Date  . ABDOMINAL HYSTERECTOMY  2000's  . ABDOMINAL  SURGERY     laparoscopy x4 with lysis of adhesions  . APPENDECTOMY  1986  . CESAREAN SECTION  1992  . CHOLECYSTECTOMY N/A 04/03/2017   Procedure: LAPAROSCOPIC CHOLECYSTECTOMY;  Surgeon: Olean Ree, MD;  Location: [facility] ORS;  Service: General;  Laterality: N/A;  . COLONOSCOPY N/A 10/06/2018   Procedure: COLONOSCOPY;  Surgeon: Lin Landsman, MD;  Location: [facility] ENDOSCOPY;  Service: Gastroenterology;  Laterality: N/A;  . COLONOSCOPY WITH PROPOFOL N/A 03/10/2017   Procedure: COLONOSCOPY WITH PROPOFOL;  Surgeon: Manya Silvas, MD;  Location: [facility] ENDOSCOPY;  Service: Endoscopy;  Laterality: N/A;  . ECTOPIC PREGNANCY SURGERY  2000's  . ESOPHAGOGASTRODUODENOSCOPY (EGD) WITH PROPOFOL N/A 03/10/2017   Procedure: ESOPHAGOGASTRODUODENOSCOPY (EGD) WITH PROPOFOL;  Surgeon: Manya Silvas, MD;  Location: [facility] ENDOSCOPY;  Service: Endoscopy;  Laterality: N/A;  . HERNIA REPAIR    . JOINT REPLACEMENT Right 12/16/12   knee- medial - makoplasty  . KNEE ARTHROSCOPY Right 2006   partial medial and lateral meniscectomies  . KNEE ARTHROSCOPY Right 10/06/2015   Procedure: RIGHT KNEE ARTHROSCOPY WITH DEBRIDEMENT;  Surgeon: Leanor Kail, MD;  Location: Day;  Service: Orthopedics;  Laterality: Right;  Diabetic - insulin and oral meds  . ROTATOR CUFF REPAIR Left 2006  . TUBAL LIGATION  2000's   Family History  Problem Relation Age of Onset  . Hypertension Mother   . Hypertension Father   . Diabetes Father   .  Allergies Father   . Kidney disease Father   . Breast cancer Neg Hx    Social History   Tobacco Use  . Smoking status: Never Smoker  . Smokeless tobacco: Never Used  Substance Use Topics  . Alcohol use: No   Allergies  Allergen Reactions  . Penicillins Anaphylaxis    Has patient had a PCN reaction causing immediate rash, facial/tongue/throat swelling, SOB or lightheadedness with hypotension: Yes Has patient had a PCN reaction causing severe rash involving mucus membranes  or skin necrosis: No Has patient had a PCN reaction that required hospitalization No Has patient had a PCN reaction occurring within the last 10 years: No If all of the above answers are "NO", then may proceed with Cephalosporin use.   . Ace Inhibitors Swelling   Prior to Admission medications   Medication Sig Start Date End Date Taking? Authorizing Provider  acetaminophen (TYLENOL) 325 MG tablet Take 2 tablets (650 mg total) by mouth every 6 (six) hours as needed for mild pain (or Fever >/= 101). 08/27/19  Yes Lurene ShadowAyiku, Bernard, MD  albuterol (PROVENTIL HFA) 108 (90 Base) MCG/ACT inhaler Inhale 2 puffs into the lungs every 4 (four) hours as needed for wheezing or shortness of breath. 07/13/19  Yes Parrett, Tammy S, NP  Ascorbic Acid (VITAMIN C) 1000 MG tablet Take 500 mg by mouth 2 (two) times daily.    Yes [provider]  DULoxetine (CYMBALTA) 60 MG capsule Take 60 mg by mouth 2 (two) times daily.   Yes [provider]  fluticasone furoate-vilanterol (BREO ELLIPTA) 200-25 MCG/INH AEPB Inhale 1 puff into the lungs daily. 1 puff daily. 07/14/19  Yes Parrett, Tammy S, NP  furosemide (LASIX) 40 MG tablet Take 1 tablet (40 mg total) by mouth daily. 08/27/19  Yes Lurene ShadowAyiku, Bernard, MD  gabapentin (NEURONTIN) 300 MG capsule Take 600 mg by mouth 2 (two) times daily. 03/22/18  Yes [provider]  Insulin Glargine, 2 Unit Dial, (TOUJEO MAX SOLOSTAR) 300 UNIT/ML SOPN Inject 52 Units into the skin daily.   Yes [provider]  ipratropium-albuterol (DUONEB) 0.5-2.5 (3) MG/3ML SOLN Inhale 3 mLs into the lungs 3 (three) times daily as needed (shortness of breath, wheezing or cough). 07/13/19  Yes Parrett, Virgel Bouquetammy S, NP  LINZESS 290 MCG CAPS capsule Take 1 tablet by mouth daily before breakfast.  02/28/17  Yes [provider]  liraglutide (VICTOZA) 18 MG/3ML SOPN Inject into the skin daily.   Yes [provider]  mirtazapine (REMERON SOL-TAB) 30 MG disintegrating  tablet Take 30 mg by mouth daily.  03/18/18  Yes [provider]  montelukast (SINGULAIR) 10 MG tablet Take 1 tablet (10 mg total) by mouth at bedtime. 07/13/19 07/12/20 Yes Parrett, Tammy S, NP  Multiple Vitamin (MULTIVITAMIN) tablet Take 1 tablet by mouth daily.   Yes [provider]  omeprazole (PRILOSEC) 40 MG capsule Take 1 capsule (40 mg total) by mouth daily. 1 hr before supper 04/05/17  Yes Piscoya, Jose, MD  Potassium Chloride ER 20 MEQ TBCR Take 20 mEq by mouth daily. Take with lasix 09/30/18  Yes [provider]  pravastatin (PRAVACHOL) 80 MG tablet Take 1 tablet (80 mg total) by mouth daily. 10/17/17 09/02/19 Yes Johnson, Clanford L, MD  senna-docusate (SENOKOT-S) 8.6-50 MG tablet Take 1 tablet by mouth 2 (two) times daily. 10/06/18  Yes Altamese DillingVachhani, Vaibhavkumar, MD  sitaGLIPtin (JANUVIA) 25 MG tablet Take 25 mg by mouth daily.   Yes [provider]  SUMAtriptan (IMITREX) 25  MG tablet Take 25 mg by mouth every 2 (two) hours as needed for migraine. May repeat in 2 hours if headache persists or recurs.   Yes [provider]  tiotropium (SPIRIVA HANDIHALER) 18 MCG inhalation capsule Place 1 capsule (18 mcg total) into inhaler and inhale daily. 07/13/19 10/11/19 Yes Parrett, Tammy S, NP  tiZANidine (ZANAFLEX) 4 MG tablet Take 4 mg by mouth every 8 (eight) hours.    Yes [provider]  topiramate (TOPAMAX) 50 MG tablet Take 50 mg by mouth 2 (two) times daily.  03/16/19  Yes [provider]  Vitamin D, Ergocalciferol, (DRISDOL) 1.25 MG (50000 UT) CAPS capsule Take 1 capsule (50,000 Units total) by mouth every 7 (seven) days. 08/09/19  Yes Vanga, Loel Dubonnet, MD  zolpidem (AMBIEN) 10 MG tablet Take 10 mg by mouth at bedtime.    Yes [provider]     Review of Systems  Constitutional: Positive for fatigue (with minimal exertion). Negative for appetite change.  HENT: Negative for congestion and postnasal drip.   Eyes: Negative.    Respiratory: Positive for shortness of breath (easily). Negative for cough and chest tightness.   Cardiovascular: Positive for palpitations and leg swelling (both feet). Negative for chest pain.  Gastrointestinal: Positive for abdominal distention ("little bloated"). Negative for abdominal pain.  Endocrine: Negative.   Genitourinary: Negative.   Musculoskeletal: Positive for arthralgias (knees hurting). Negative for back pain.  Skin: Negative.   Allergic/Immunologic: Negative.   Neurological: Positive for light-headedness (sometimes). Negative for dizziness.  Hematological: Negative for adenopathy. Does not bruise/bleed easily.  Psychiatric/Behavioral: Positive for sleep disturbance. Negative for dysphoric mood. The patient is not nervous/anxious.    Vitals:   09/02/19 1045  BP: 138/90  Pulse: 92  Resp: 18  SpO2: 100%  Weight: 209 lb 12.8 oz (95.2 kg)  Height: 5\' 5"  (1.651 m)   Wt Readings from Last 3 Encounters:  09/02/19 209 lb 12.8 oz (95.2 kg)  08/26/19 205 lb (93 kg)  08/25/19 205 lb 1.6 oz (93 kg)   Lab Results  Component Value Date   CREATININE 0.89 08/27/2019   CREATININE 1.21 (H) 08/26/2019   CREATININE 0.92 11/14/2018    Physical Exam Vitals and nursing note reviewed.  Constitutional:      Appearance: She is well-developed.  HENT:     Head: Normocephalic and atraumatic.  Neck:     Thyroid: No thyromegaly.  Cardiovascular:     Rate and Rhythm: Normal rate and regular rhythm.  Pulmonary:     Effort: Pulmonary effort is normal. No respiratory distress.     Breath sounds: No wheezing or rales.  Abdominal:     General: There is no distension.     Palpations: Abdomen is soft.  Musculoskeletal:        General: Tenderness (over bilateral shins) present.     Cervical back: Normal range of motion and neck supple.     Right lower leg: No tenderness. No edema.     Left lower leg: No tenderness. No edema.  Skin:    General: Skin is warm and dry.  Neurological:      Mental Status: She is alert and oriented to person, place, and time.  Psychiatric:        Behavior: Behavior normal.    Assessment & Plan:  1: Chronic heart failure with preserved ejection fraction- - NYHA class III - euvolemic today - weighing daily and she was reminded to call for an overnight weight gain of >2  pounds or a weekly weight gain of >5 pounds - weight stable from last visit here 9 days ago - follows with cardiology [name]) & saw him ~ Nov 2020 - not adding salt and it was reviewed about keeping daily sodium intake to 2000mg  daily - drinking ~ 2L of water daily - BNP 07/09/18 was 12.0  - has not received her flu vaccine yet  2: HTN- - BP good here today - saw PCP (Aycock) ~ 2 weeks ago; suggested she call to schedule an appointment regarding her knee pain - BMP 08/27/2019 reviewed and showed sodium 140, potassium 4.0, creatinine 0.89 and GFR >60  3: DM- - A1c 08/05/2019 was 11.7% - glucose in clinic today was 159 - saw endocrinology Rosaura Carpenter) 06/29/18  Patient did not bring her medications nor a list. Each medication was verbally reviewed with the patient and she was encouraged to bring the bottles to every visit to confirm accuracy of list.  Return in 4 months or sooner for any questions/problems before then.

## 2019-09-02 ENCOUNTER — Other Ambulatory Visit: Payer: Self-pay

## 2019-09-02 ENCOUNTER — Encounter: Payer: Self-pay | Admitting: Family

## 2019-09-02 ENCOUNTER — Ambulatory Visit: Payer: 59 | Attending: Family | Admitting: Family

## 2019-09-02 VITALS — BP 138/90 | HR 92 | Resp 18 | Ht 65.0 in | Wt 209.8 lb

## 2019-09-02 DIAGNOSIS — Z90710 Acquired absence of both cervix and uterus: Secondary | ICD-10-CM | POA: Insufficient documentation

## 2019-09-02 DIAGNOSIS — E119 Type 2 diabetes mellitus without complications: Secondary | ICD-10-CM | POA: Insufficient documentation

## 2019-09-02 DIAGNOSIS — Z833 Family history of diabetes mellitus: Secondary | ICD-10-CM | POA: Diagnosis not present

## 2019-09-02 DIAGNOSIS — J45909 Unspecified asthma, uncomplicated: Secondary | ICD-10-CM | POA: Diagnosis not present

## 2019-09-02 DIAGNOSIS — M1389 Other specified arthritis, multiple sites: Secondary | ICD-10-CM | POA: Insufficient documentation

## 2019-09-02 DIAGNOSIS — M797 Fibromyalgia: Secondary | ICD-10-CM | POA: Diagnosis not present

## 2019-09-02 DIAGNOSIS — I110 Hypertensive heart disease with heart failure: Secondary | ICD-10-CM | POA: Diagnosis not present

## 2019-09-02 DIAGNOSIS — E876 Hypokalemia: Secondary | ICD-10-CM | POA: Diagnosis not present

## 2019-09-02 DIAGNOSIS — Z8249 Family history of ischemic heart disease and other diseases of the circulatory system: Secondary | ICD-10-CM | POA: Diagnosis not present

## 2019-09-02 DIAGNOSIS — Z79899 Other long term (current) drug therapy: Secondary | ICD-10-CM | POA: Insufficient documentation

## 2019-09-02 DIAGNOSIS — Z794 Long term (current) use of insulin: Secondary | ICD-10-CM | POA: Diagnosis not present

## 2019-09-02 DIAGNOSIS — Z9851 Tubal ligation status: Secondary | ICD-10-CM | POA: Diagnosis not present

## 2019-09-02 DIAGNOSIS — Z9049 Acquired absence of other specified parts of digestive tract: Secondary | ICD-10-CM | POA: Diagnosis not present

## 2019-09-02 DIAGNOSIS — Z888 Allergy status to other drugs, medicaments and biological substances status: Secondary | ICD-10-CM | POA: Insufficient documentation

## 2019-09-02 DIAGNOSIS — G4733 Obstructive sleep apnea (adult) (pediatric): Secondary | ICD-10-CM | POA: Insufficient documentation

## 2019-09-02 DIAGNOSIS — I5032 Chronic diastolic (congestive) heart failure: Secondary | ICD-10-CM | POA: Insufficient documentation

## 2019-09-02 DIAGNOSIS — I10 Essential (primary) hypertension: Secondary | ICD-10-CM

## 2019-09-02 DIAGNOSIS — Z880 Allergy status to penicillin: Secondary | ICD-10-CM | POA: Insufficient documentation

## 2019-09-02 DIAGNOSIS — Z7951 Long term (current) use of inhaled steroids: Secondary | ICD-10-CM | POA: Diagnosis not present

## 2019-09-02 DIAGNOSIS — K219 Gastro-esophageal reflux disease without esophagitis: Secondary | ICD-10-CM | POA: Diagnosis not present

## 2019-09-02 DIAGNOSIS — Z8673 Personal history of transient ischemic attack (TIA), and cerebral infarction without residual deficits: Secondary | ICD-10-CM | POA: Diagnosis not present

## 2019-09-02 LAB — GLUCOSE, CAPILLARY: Glucose-Capillary: 159 mg/dL — ABNORMAL HIGH (ref 70–99)

## 2019-09-02 NOTE — Patient Instructions (Signed)
Continue weighing daily and call for an overnight weight gain of > 2 pounds or a weekly weight gain of >5 pounds. 

## 2019-09-09 ENCOUNTER — Other Ambulatory Visit: Payer: Self-pay

## 2019-09-09 ENCOUNTER — Ambulatory Visit (INDEPENDENT_AMBULATORY_CARE_PROVIDER_SITE_OTHER): Payer: 59 | Admitting: Gastroenterology

## 2019-09-09 DIAGNOSIS — Z23 Encounter for immunization: Secondary | ICD-10-CM | POA: Diagnosis not present

## 2019-09-21 ENCOUNTER — Ambulatory Visit: Payer: 59 | Admitting: Gastroenterology

## 2019-09-28 ENCOUNTER — Emergency Department: Payer: 59

## 2019-09-28 ENCOUNTER — Other Ambulatory Visit: Payer: Self-pay

## 2019-09-28 ENCOUNTER — Emergency Department
Admission: EM | Admit: 2019-09-28 | Discharge: 2019-09-28 | Disposition: A | Discharge status: Home / Self Care | Payer: 59 | Attending: Emergency Medicine | Admitting: Emergency Medicine

## 2019-09-28 ENCOUNTER — Encounter: Payer: Self-pay | Admitting: Emergency Medicine

## 2019-09-28 DIAGNOSIS — Y998 Other external cause status: Secondary | ICD-10-CM | POA: Insufficient documentation

## 2019-09-28 DIAGNOSIS — R55 Syncope and collapse: Secondary | ICD-10-CM | POA: Diagnosis not present

## 2019-09-28 DIAGNOSIS — R531 Weakness: Secondary | ICD-10-CM | POA: Diagnosis not present

## 2019-09-28 DIAGNOSIS — Y92018 Other place in single-family (private) house as the place of occurrence of the external cause: Secondary | ICD-10-CM | POA: Diagnosis not present

## 2019-09-28 DIAGNOSIS — I5032 Chronic diastolic (congestive) heart failure: Secondary | ICD-10-CM | POA: Diagnosis not present

## 2019-09-28 DIAGNOSIS — Z96651 Presence of right artificial knee joint: Secondary | ICD-10-CM | POA: Insufficient documentation

## 2019-09-28 DIAGNOSIS — R0602 Shortness of breath: Secondary | ICD-10-CM | POA: Diagnosis not present

## 2019-09-28 DIAGNOSIS — X58XXXA Exposure to other specified factors, initial encounter: Secondary | ICD-10-CM | POA: Insufficient documentation

## 2019-09-28 DIAGNOSIS — E119 Type 2 diabetes mellitus without complications: Secondary | ICD-10-CM | POA: Insufficient documentation

## 2019-09-28 DIAGNOSIS — S8992XA Unspecified injury of left lower leg, initial encounter: Secondary | ICD-10-CM | POA: Insufficient documentation

## 2019-09-28 DIAGNOSIS — I10 Essential (primary) hypertension: Secondary | ICD-10-CM

## 2019-09-28 DIAGNOSIS — Z79899 Other long term (current) drug therapy: Secondary | ICD-10-CM | POA: Insufficient documentation

## 2019-09-28 DIAGNOSIS — R110 Nausea: Secondary | ICD-10-CM | POA: Diagnosis not present

## 2019-09-28 DIAGNOSIS — Y9389 Activity, other specified: Secondary | ICD-10-CM | POA: Diagnosis not present

## 2019-09-28 DIAGNOSIS — I110 Hypertensive heart disease with heart failure: Secondary | ICD-10-CM | POA: Insufficient documentation

## 2019-09-28 LAB — CBC WITH DIFFERENTIAL/PLATELET
Abs Immature Granulocytes: 0.04 10*3/uL (ref 0.00–0.07)
Basophils Absolute: 0.1 10*3/uL (ref 0.0–0.1)
Basophils Relative: 1 %
Eosinophils Absolute: 0.1 10*3/uL (ref 0.0–0.5)
Eosinophils Relative: 1 %
HCT: 41.2 % (ref 36.0–46.0)
Hemoglobin: 13.3 g/dL (ref 12.0–15.0)
Immature Granulocytes: 1 %
Lymphocytes Relative: 28 %
Lymphs Abs: 1.8 10*3/uL (ref 0.7–4.0)
MCH: 29.9 pg (ref 26.0–34.0)
MCHC: 32.3 g/dL (ref 30.0–36.0)
MCV: 92.6 fL (ref 80.0–100.0)
Monocytes Absolute: 0.5 10*3/uL (ref 0.1–1.0)
Monocytes Relative: 7 %
Neutro Abs: 4.1 10*3/uL (ref 1.7–7.7)
Neutrophils Relative %: 62 %
Platelets: 332 10*3/uL (ref 150–400)
RBC: 4.45 MIL/uL (ref 3.87–5.11)
RDW: 14.6 % (ref 11.5–15.5)
WBC: 6.5 10*3/uL (ref 4.0–10.5)
nRBC: 0 % (ref 0.0–0.2)

## 2019-09-28 LAB — COMPREHENSIVE METABOLIC PANEL
ALT: 93 U/L — ABNORMAL HIGH (ref 0–44)
AST: 49 U/L — ABNORMAL HIGH (ref 15–41)
Albumin: 4.3 g/dL (ref 3.5–5.0)
Alkaline Phosphatase: 123 U/L (ref 38–126)
Anion gap: 10 (ref 5–15)
BUN: 15 mg/dL (ref 6–20)
CO2: 25 mmol/L (ref 22–32)
Calcium: 9.6 mg/dL (ref 8.9–10.3)
Chloride: 105 mmol/L (ref 98–111)
Creatinine, Ser: 0.89 mg/dL (ref 0.44–1.00)
GFR calc Af Amer: >60 mL/min (ref >60)
GFR calc non Af Amer: >60 mL/min (ref >60)
Glucose, Bld: 167 mg/dL — ABNORMAL HIGH (ref 70–99)
Potassium: 3.6 mmol/L (ref 3.5–5.1)
Sodium: 140 mmol/L (ref 135–145)
Total Bilirubin: 0.6 mg/dL (ref 0.3–1.2)
Total Protein: 8 g/dL (ref 6.5–8.1)

## 2019-09-28 LAB — LACTIC ACID, PLASMA: Lactic Acid, Venous: 1.7 mmol/L (ref 0.5–1.9)

## 2019-09-28 LAB — TROPONIN I (HIGH SENSITIVITY)
Troponin I (High Sensitivity): 4 ng/L (ref <18)
Troponin I (High Sensitivity): 5 ng/L (ref <18)

## 2019-09-28 LAB — BRAIN NATRIURETIC PEPTIDE: B Natriuretic Peptide: 108 pg/mL — ABNORMAL HIGH (ref 0.0–100.0)

## 2019-09-28 MED ORDER — HYDROCODONE-ACETAMINOPHEN 5-325 MG PO TABS
1.0000 | ORAL_TABLET | Freq: Once | ORAL | Status: AC
  Administered 2019-09-28: 1 via ORAL
  Filled 2019-09-28: qty 1

## 2019-09-28 MED ORDER — LOSARTAN POTASSIUM 100 MG PO TABS: 100.0000 mg | ORAL_TABLET | Freq: Every day | ORAL | 1 refills | Status: DC

## 2019-09-28 MED ORDER — HYDROCODONE-ACETAMINOPHEN 5-325 MG PO TABS: 1.0000 | ORAL_TABLET | ORAL | 0 refills | Status: AC | PRN

## 2019-09-28 MED ORDER — ONDANSETRON HCL 4 MG/2ML IJ SOLN
4.0000 mg | Freq: Once | INTRAMUSCULAR | Status: AC
  Administered 2019-09-28: 14:00:00 4 mg via INTRAVENOUS
  Filled 2019-09-28: qty 2

## 2019-09-28 MED ORDER — LABETALOL HCL 5 MG/ML IV SOLN
10.0000 mg | Freq: Once | INTRAVENOUS | Status: AC
  Administered 2019-09-28: 10 mg via INTRAVENOUS
  Filled 2019-09-28: qty 4

## 2019-09-28 MED ORDER — LABETALOL HCL 5 MG/ML IV SOLN
5.0000 mg | Freq: Once | INTRAVENOUS | Status: AC
  Administered 2019-09-28: 18:00:00 5 mg via INTRAVENOUS
  Filled 2019-09-28: qty 4

## 2019-09-28 NOTE — ED Notes (Signed)
Pt st she was taken off her BP medication around "the ned of december".

## 2019-09-28 NOTE — ED Notes (Signed)
Pt A/Ox4 denies CP/SHOB at this time.

## 2019-09-28 NOTE — ED Notes (Signed)
This Rn spoke with Joni Reining regarding 2nd trop collected by lab tech but unable to see results. Per lab tech Joni Reining, this Rn will have to call back in 10 minutes.

## 2019-09-28 NOTE — ED Provider Notes (Signed)
[facility] Emergency Department Provider Note ____________________________________________   First MD Initiated Contact with Patient 09/28/19 1257     (approximate)  I have reviewed the triage vital signs and the nursing notes.   HISTORY  Chief Complaint Chest Pain, Shortness of Breath, and Hypertension    HPI Amanda [name] is a 54 y.o. female with PMH as noted below including CHF, hypertension and diabetes who presents with multiple symptoms, acute onset within the last hour while she was at an orthopedic appointment.  Specifically the patient reports shortness of breath and chest pain, associated with generalized weakness and nausea.  EMS noted that the patient's blood pressure was significantly elevated and gave her Nitropaste and oxygen with some improvement in her symptoms.  Patient states that she hurt her knee on 12/24 and had a syncopal episode.  She was seen in the ER and admitted.  Today she was following up at the orthopedic office to have the knee evaluated.  The patient states that after the hospitalization she was taken off of most of her blood pressure medication because she had become hypotensive.  She states that she has been compliant with all of her medications.  Past Medical History:  Diagnosis Date  . Arthritis    knees, shoulder, upper back  . Asthma   . CHF (congestive heart failure) (HCC)   . Diabetes mellitus without complication (HCC)   . Environmental allergies   . Fibromyalgia   . GERD (gastroesophageal reflux disease)   . GI bleed 10/02/2018  . Headache    migraines - 5x/mo  . Hypertension   . Hypokalemia 10/14/2017  . Migraines   . Motion sickness    ships  . Sleep apnea   . Stroke [facility](HCC)    no residual deficits  . Thyroid nodule    bilateral and goiter  . Wears contact lenses   . Wears dentures    partial upper    Patient Active Problem List   Diagnosis Date Noted  . Syncope 08/27/2019  . Syncope and collapse  08/26/2019  . Asthma 07/13/2019  . Hypotension 10/10/2018  . Acute on chronic diastolic (congestive) heart failure (HCC) 07/17/2018  . Acute on chronic heart failure (HCC) 07/08/2018  . Chronic diastolic heart failure (HCC) 04/17/2018  . Lymphedema 04/17/2018  . Chest pain 03/28/2018  . Hypertension 10/15/2017  . Wears dentures 10/15/2017  . Symptomatic cholelithiasis 04/02/2017  . Recurrent incisional hernia 03/17/2017  . Calculus of gallbladder without cholecystitis without obstruction 03/17/2017  . Helicobacter pylori gastritis 03/17/2017  . Depression 09/28/2016  . Diabetes mellitus, type 2 (HCC) 09/28/2016  . Tremor 07/01/2013  . Esophageal reflux 06/22/2012  . Major depressive disorder, single episode 08/22/2009  . Sleep apnea 05/30/2009  . Hyperlipidemia 08/10/2008    Past Surgical History:  Procedure Laterality Date  . ABDOMINAL HYSTERECTOMY  2000's  . ABDOMINAL SURGERY     laparoscopy x4 with lysis of adhesions  . APPENDECTOMY  1986  . CESAREAN SECTION  1992  . CHOLECYSTECTOMY N/A 04/03/2017   Procedure: LAPAROSCOPIC CHOLECYSTECTOMY;  Surgeon: Henrene DodgePiscoya, Jose, MD;  Location: [facility] ORS;  Service: General;  Laterality: N/A;  . COLONOSCOPY N/A 10/06/2018   Procedure: COLONOSCOPY;  Surgeon: Toney ReilVanga, Rohini Reddy, MD;  [facility];  Service: Gastroenterology;  Laterality: N/A;  . COLONOSCOPY WITH PROPOFOL N/A 03/10/2017   Procedure: COLONOSCOPY WITH PROPOFOL;  Surgeon: Scot JunElliott, Robert T, MD;  [facility];  Service: Endoscopy;  Laterality: N/A;  . ECTOPIC PREGNANCY SURGERY  2000's  . ESOPHAGOGASTRODUODENOSCOPY (EGD) WITH PROPOFOL N/A 03/10/2017   Procedure: ESOPHAGOGASTRODUODENOSCOPY (EGD) WITH PROPOFOL;  Surgeon: Manya Silvas, MD;  Location: [facility] ENDOSCOPY;  Service: Endoscopy;  Laterality: N/A;  . HERNIA REPAIR    . JOINT REPLACEMENT Right 12/16/12   knee- medial - makoplasty  . KNEE ARTHROSCOPY Right 2006   partial medial and lateral meniscectomies    . KNEE ARTHROSCOPY Right 10/06/2015   Procedure: RIGHT KNEE ARTHROSCOPY WITH DEBRIDEMENT;  Surgeon: Leanor Kail, MD;  Location: [place];  Service: Orthopedics;  Laterality: Right;  Diabetic - insulin and oral meds  . ROTATOR CUFF REPAIR Left 2006  . TUBAL LIGATION  2000's    Prior to Admission medications   Medication Sig Start Date End Date Taking? Authorizing Provider  acetaminophen (TYLENOL) 325 MG tablet Take 2 tablets (650 mg total) by mouth every 6 (six) hours as needed for mild pain (or Fever >/= 101). 08/27/19   Jennye Boroughs, MD  albuterol (PROVENTIL HFA) 108 (90 Base) MCG/ACT inhaler Inhale 2 puffs into the lungs every 4 (four) hours as needed for wheezing or shortness of breath. 07/13/19   Parrett, Fonnie Mu, NP  Ascorbic Acid (VITAMIN C) 1000 MG tablet Take 500 mg by mouth 2 (two) times daily.     [provider]  DULoxetine (CYMBALTA) 60 MG capsule Take 60 mg by mouth 2 (two) times daily.    [provider]  fluticasone furoate-vilanterol (BREO ELLIPTA) 200-25 MCG/INH AEPB Inhale 1 puff into the lungs daily. 1 puff daily. 07/14/19   Parrett, Fonnie Mu, NP  furosemide (LASIX) 40 MG tablet Take 1 tablet (40 mg total) by mouth daily. 08/27/19   Jennye Boroughs, MD  gabapentin (NEURONTIN) 300 MG capsule Take 600 mg by mouth 2 (two) times daily. 03/22/18   [provider]  HYDROcodone-acetaminophen (NORCO/VICODIN) 5-325 MG tablet Take 1 tablet by mouth every 4 (four) hours as needed for up to 5 days for moderate pain. 09/28/19 10/03/19  Arta Silence, MD  Insulin Glargine, 2 Unit Dial, (TOUJEO MAX SOLOSTAR) 300 UNIT/ML SOPN Inject 52 Units into the skin daily.    [provider]  ipratropium-albuterol (DUONEB) 0.5-2.5 (3) MG/3ML SOLN Inhale 3 mLs into the lungs 3 (three) times daily as needed (shortness of breath, wheezing or cough). 07/13/19   Parrett, Fonnie Mu, NP  LINZESS 290 MCG CAPS capsule Take 1 tablet by mouth daily before breakfast.   02/28/17   [provider]  liraglutide (VICTOZA) 18 MG/3ML SOPN Inject into the skin daily.    [provider]  losartan (COZAAR) 100 MG tablet Take 1 tablet (100 mg total) by mouth daily. 09/28/19 11/27/19  Arta Silence, MD  mirtazapine (REMERON SOL-TAB) 30 MG disintegrating tablet Take 30 mg by mouth daily.  03/18/18   [provider]  montelukast (SINGULAIR) 10 MG tablet Take 1 tablet (10 mg total) by mouth at bedtime. 07/13/19 07/12/20  Parrett, Fonnie Mu, NP  Multiple Vitamin (MULTIVITAMIN) tablet Take 1 tablet by mouth daily.    [provider]  omeprazole (PRILOSEC) 40 MG capsule Take 1 capsule (40 mg total) by mouth daily. 1 hr before supper 04/05/17   Piscoya, Jacqulyn Bath, MD  Potassium Chloride ER 20 MEQ TBCR Take 20 mEq by mouth daily. Take with lasix 09/30/18   [provider]  pravastatin (PRAVACHOL) 80 MG tablet Take 1 tablet (80 mg total) by mouth daily. 10/17/17 09/02/19  Johnson, Clanford L, MD  senna-docusate (SENOKOT-S) 8.6-50 MG tablet Take 1 tablet by  mouth 2 (two) times daily. 10/06/18   Altamese Dilling, MD  sitaGLIPtin (JANUVIA) 25 MG tablet Take 25 mg by mouth daily.    [provider]  SUMAtriptan (IMITREX) 25 MG tablet Take 25 mg by mouth every 2 (two) hours as needed for migraine. May repeat in 2 hours if headache persists or recurs.    [provider]  tiotropium (SPIRIVA HANDIHALER) 18 MCG inhalation capsule Place 1 capsule (18 mcg total) into inhaler and inhale daily. 07/13/19 10/11/19  Parrett, Virgel Bouquet, NP  tiZANidine (ZANAFLEX) 4 MG tablet Take 4 mg by mouth every 8 (eight) hours.     [provider]  topiramate (TOPAMAX) 50 MG tablet Take 50 mg by mouth 2 (two) times daily.  03/16/19   [provider]  Vitamin D, Ergocalciferol, (DRISDOL) 1.25 MG (50000 UT) CAPS capsule Take 1 capsule (50,000 Units total) by mouth every 7 (seven) days. 08/09/19   Toney Reil, MD  zolpidem (AMBIEN) 10 MG  tablet Take 10 mg by mouth at bedtime.     [provider]    Allergies Penicillins and Ace inhibitors  Family History  Problem Relation Age of Onset  . Hypertension Mother   . Hypertension Father   . Diabetes Father   . Allergies Father   . Kidney disease Father   . Breast cancer Neg Hx     Social History Social History   Tobacco Use  . Smoking status: Never Smoker  . Smokeless tobacco: Never Used  Substance Use Topics  . Alcohol use: No  . Drug use: No    Review of Systems  Constitutional: No fever.  Positive for generalized weakness. Eyes: Positive for blurred vision.. ENT: No sore throat. Cardiovascular: Positive for chest pain. Respiratory: Positive for shortness of breath. Gastrointestinal: Positive for nausea.  No vomiting or diarrhea.  Genitourinary: Negative for dysuria.  Musculoskeletal: Negative for back pain. Skin: Negative for rash. Neurological: Negative for headache.   ____________________________________________   PHYSICAL EXAM:  VITAL SIGNS: ED Triage Vitals  Enc Vitals Group     BP --      Pulse --      Resp --      Temp --      Temp src --      SpO2 --      Weight 09/28/19 1301 200 lb (90.7 kg)     Height 09/28/19 1301 5\' 5"  (1.651 m)     Head Circumference --      Peak Flow --      Pain Score 09/28/19 1254 0     Pain Loc --      Pain Edu? --      Excl. in GC? --     Constitutional: Alert and oriented. Well appearing and in no acute distress. Eyes: Conjunctivae are normal.  Head: Atraumatic. Nose: No congestion/rhinnorhea. Mouth/Throat: Mucous membranes are moist.   Neck: Normal range of motion.  Cardiovascular: Normal rate, regular rhythm.  Good peripheral circulation. Respiratory: Normal respiratory effort.  No retractions.  Gastrointestinal:  No distention. Musculoskeletal: 1+ bilateral lower extremity edema.  Extremities warm and well perfused.  Pain on range of motion of left knee with no  deformity. Neurologic:  Normal speech and language.  Motor and sensory intact in all extremities. Skin:  Skin is warm and dry. No rash noted. Psychiatric: Mood and affect are normal. Speech and behavior are normal.  ____________________________________________   LABS (all labs ordered are listed, but only abnormal results are  displayed)  Labs Reviewed  COMPREHENSIVE METABOLIC PANEL - Abnormal; Notable for the following components:      Result Value   Glucose, Bld 167 (*)    AST 49 (*)    ALT 93 (*)    All other components within normal limits  BRAIN NATRIURETIC PEPTIDE - Abnormal; Notable for the following components:   B Natriuretic Peptide 108.0 (*)    All other components within normal limits  CBC WITH DIFFERENTIAL/PLATELET  LACTIC ACID, PLASMA  LACTIC ACID, PLASMA  URINALYSIS, COMPLETE (UACMP) WITH MICROSCOPIC  TROPONIN I (HIGH SENSITIVITY)  TROPONIN I (HIGH SENSITIVITY)   ____________________________________________  EKG  ED ECG REPORT I, Dionne Bucy, the attending physician, personally viewed and interpreted this ECG.  Date: 09/28/2019 EKG Time: 1304 Rate: 93 Rhythm: normal sinus rhythm QRS Axis: normal Intervals: normal ST/T Wave abnormalities: normal Narrative Interpretation: no evidence of acute ischemia  ____________________________________________  RADIOLOGY  CXR: No edema or other acute abnormality XR L knee: No acute fracture US venous bilateral lower extremity: No acute DVT ____________________________________________   PROCEDURES  Procedure(s) performed: No  Procedures  Critical Care performed: Yes  CRITICAL CARE Performed by: Dionne Bucy   Total critical care time: 30 minutes  Critical care time was exclusive of separately billable procedures and treating other patients.  Critical care was necessary to treat or prevent imminent or life-threatening deterioration.  Critical care was time spent personally by me on the  following activities: development of treatment plan with patient and/or surrogate as well as nursing, discussions with consultants, evaluation of patient's response to treatment, examination of patient, obtaining history from patient or surrogate, ordering and performing treatments and interventions, ordering and review of laboratory studies, ordering and review of radiographic studies, pulse oximetry and re-evaluation of patient's condition. ____________________________________________   INITIAL IMPRESSION / ASSESSMENT AND PLAN / ED COURSE  Pertinent labs & imaging results that were available during my care of the patient were reviewed by me and considered in my medical decision making (see chart for details).  54 year old female with PMH as noted above including CHF, hypertension, and diabetes presents with shortness of breath, chest pain, and generalized fatigue culminating in a near syncopal type event while she was at an orthopedics appointment.  She states she has been feeling somewhat worse in the last few days with increased shortness of breath and fatigue.  EMS noted that the patient was extremely hypertensive and gave Nitropaste.  I reviewed the past medical records in epic.  The patient was seen in the ED on 12/24 with a seizure-like episode and persistent hypotension and was admitted.  She reports that this is when she was taken off of much of her blood pressure regimen.  On exam, the patient is relatively well-appearing.  She is very hypertensive with otherwise normal vital signs.  She is alert and oriented and has no respiratory distress or significant increased work of breathing.  She has mild peripheral edema.  The remainder of the exam is as described above.  Differential includes hypertensive crisis, CHF exacerbation, ACS, or possible metabolic etiology.  I have ordered a chest x-ray (as well as x-ray of the left knee which has not yet been imaged since her injury), lab work-up, as  well as IV labetalol for blood pressure control.  ----------------------------------------- 7:19 PM on 09/28/2019 -----------------------------------------  The blood pressure significantly improved with labetalol, and now has been ranging around 160/100 for the last several hours.  The patient did have a brief increase to 170/110  and I gave an additional dose of labetalol.  The lab work-up and x-rays are reassuring.  The patient has no signs of fluid overload or acute CHF.  Initial and repeat troponins are both negative.  Overall there is no evidence of endorgan dysfunction or any ongoing hypertensive urgency.   The patient explained to me that she had a relatively rapid increase in her leg swelling although it was bilateral.  Therefore I obtained bilateral lower extremity ultrasound to rule out DVT and this was negative as well.  The patient remains with a heart rate between 95 and 105.  She states that she is chronically tachycardic to this range, takes propranolol for this.  There is no clinical evidence of PE or other concerning acute etiology of this finding.  At this time, the patient feels much better and would like to go home.  She request some pain medication for the ongoing pain in the left knee until she can follow-up with orthopedics.  In addition, I suspect that although the patient was seen for an episode of hypotension and discontinued from her antihypertensives last month, she likely still does have some baseline hypertension and would benefit from being back on at least one agent.  I will restart her on losartan and I have instructed her to follow-up with her regular doctor and check her blood pressure at home.  Return precautions given, she expressed understanding.  ____________________________________________   FINAL CLINICAL IMPRESSION(S) / ED DIAGNOSES  Final diagnoses:  Near syncope  Hypertension, unspecified type  Injury of left knee, initial encounter      NEW  MEDICATIONS STARTED DURING THIS VISIT:  New Prescriptions   HYDROCODONE-ACETAMINOPHEN (NORCO/VICODIN) 5-325 MG TABLET    Take 1 tablet by mouth every 4 (four) hours as needed for up to 5 days for moderate pain.   LOSARTAN (COZAAR) 100 MG TABLET    Take 1 tablet (100 mg total) by mouth daily.     Note:  This document was prepared using Dragon voice recognition software and may include unintentional dictation errors.    Dionne Bucy, MD 09/28/19 [facility] [place]

## 2019-09-28 NOTE — ED Notes (Signed)
Pt provided with diet ginger ale with cracker per pt;s request.

## 2019-09-28 NOTE — ED Notes (Signed)
Lab tech , Marylene Land at bedside to obtain 2nd troponin.

## 2019-09-28 NOTE — ED Triage Notes (Addendum)
Pt from orthopedic clinic via [facility]. Per EMS, pt injured her left knee on christmas eve. Pt today c/o dizziness, blurry vision, nauseous, SOB. Per EMS hypertensive at 226/140. 1 nitro paste given/placed on left sided chest by EMS. EDP Siadeki at bedside upon arrival. NAD upon arrival .

## 2019-09-28 NOTE — Discharge Instructions (Addendum)
Restart the losartan and take it daily.  You should check your blood pressure periodically to make sure that it is improving.  Call your primary care doctor tomorrow to arrange for an appointment in the next 1 to 2 weeks, as well as the orthopedics office to reschedule your appointment from today.   Return to the ER immediately for new, worsening, or persistent lightheadedness, dizziness, chest pain, difficulty breathing, elevated blood pressures especially over 180 on the top number or 120 on the bottom number, severe headache, confusion, or any other new or worsening symptoms that concern you.

## 2019-09-28 NOTE — ED Notes (Signed)
EDP Siadecki at bedside to provide status update at this time.

## 2019-09-28 NOTE — ED Notes (Signed)
This RN spoke with Marylene Land,  Lab tech  regarding 2nd trop pending results. Trop drawn by Thayer Ohm, lab tech but unable to see results.  This RN will call back to obtain results.

## 2019-09-28 NOTE — ED Notes (Signed)
US tech at bedside at this time. ?

## 2019-10-05 ENCOUNTER — Ambulatory Visit (INDEPENDENT_AMBULATORY_CARE_PROVIDER_SITE_OTHER): Payer: Medicaid Other | Admitting: Family Medicine

## 2019-10-05 DIAGNOSIS — Z0289 Encounter for other administrative examinations: Secondary | ICD-10-CM

## 2019-10-07 ENCOUNTER — Ambulatory Visit: Payer: 59

## 2019-10-09 ENCOUNTER — Emergency Department: Payer: Medicare HMO

## 2019-10-09 ENCOUNTER — Encounter: Payer: Self-pay | Admitting: Emergency Medicine

## 2019-10-09 ENCOUNTER — Other Ambulatory Visit: Payer: Self-pay

## 2019-10-09 ENCOUNTER — Emergency Department
Admission: EM | Admit: 2019-10-09 | Discharge: 2019-10-09 | Disposition: A | Discharge status: Home / Self Care | Payer: Medicare HMO | Attending: Student | Admitting: Student

## 2019-10-09 DIAGNOSIS — E1165 Type 2 diabetes mellitus with hyperglycemia: Secondary | ICD-10-CM | POA: Insufficient documentation

## 2019-10-09 DIAGNOSIS — R0989 Other specified symptoms and signs involving the circulatory and respiratory systems: Secondary | ICD-10-CM | POA: Diagnosis not present

## 2019-10-09 DIAGNOSIS — Z794 Long term (current) use of insulin: Secondary | ICD-10-CM | POA: Insufficient documentation

## 2019-10-09 DIAGNOSIS — I959 Hypotension, unspecified: Secondary | ICD-10-CM | POA: Diagnosis not present

## 2019-10-09 DIAGNOSIS — I5032 Chronic diastolic (congestive) heart failure: Secondary | ICD-10-CM | POA: Diagnosis not present

## 2019-10-09 DIAGNOSIS — I110 Hypertensive heart disease with heart failure: Secondary | ICD-10-CM | POA: Insufficient documentation

## 2019-10-09 DIAGNOSIS — Z79899 Other long term (current) drug therapy: Secondary | ICD-10-CM | POA: Diagnosis not present

## 2019-10-09 DIAGNOSIS — H532 Diplopia: Secondary | ICD-10-CM | POA: Diagnosis present

## 2019-10-09 DIAGNOSIS — J45909 Unspecified asthma, uncomplicated: Secondary | ICD-10-CM | POA: Insufficient documentation

## 2019-10-09 DIAGNOSIS — H538 Other visual disturbances: Secondary | ICD-10-CM | POA: Diagnosis not present

## 2019-10-09 DIAGNOSIS — R519 Headache, unspecified: Secondary | ICD-10-CM | POA: Diagnosis not present

## 2019-10-09 DIAGNOSIS — Z20822 Contact with and (suspected) exposure to covid-19: Secondary | ICD-10-CM | POA: Insufficient documentation

## 2019-10-09 DIAGNOSIS — Z96651 Presence of right artificial knee joint: Secondary | ICD-10-CM | POA: Insufficient documentation

## 2019-10-09 DIAGNOSIS — R6889 Other general symptoms and signs: Secondary | ICD-10-CM

## 2019-10-09 DIAGNOSIS — Z743 Need for continuous supervision: Secondary | ICD-10-CM | POA: Diagnosis not present

## 2019-10-09 DIAGNOSIS — R918 Other nonspecific abnormal finding of lung field: Secondary | ICD-10-CM | POA: Diagnosis not present

## 2019-10-09 LAB — CBC WITH DIFFERENTIAL/PLATELET
Abs Immature Granulocytes: 0.01 10*3/uL (ref 0.00–0.07)
Basophils Absolute: 0 10*3/uL (ref 0.0–0.1)
Basophils Relative: 1 %
Eosinophils Absolute: 0.1 10*3/uL (ref 0.0–0.5)
Eosinophils Relative: 1 %
HCT: 36.4 % (ref 36.0–46.0)
Hemoglobin: 11.4 g/dL — ABNORMAL LOW (ref 12.0–15.0)
Immature Granulocytes: 0 %
Lymphocytes Relative: 35 %
Lymphs Abs: 2 10*3/uL (ref 0.7–4.0)
MCH: 30 pg (ref 26.0–34.0)
MCHC: 31.3 g/dL (ref 30.0–36.0)
MCV: 95.8 fL (ref 80.0–100.0)
Monocytes Absolute: 0.4 10*3/uL (ref 0.1–1.0)
Monocytes Relative: 7 %
Neutro Abs: 3.2 10*3/uL (ref 1.7–7.7)
Neutrophils Relative %: 56 %
Platelets: 367 10*3/uL (ref 150–400)
RBC: 3.8 MIL/uL — ABNORMAL LOW (ref 3.87–5.11)
RDW: 14.5 % (ref 11.5–15.5)
WBC: 5.7 10*3/uL (ref 4.0–10.5)
nRBC: 0 % (ref 0.0–0.2)

## 2019-10-09 LAB — COMPREHENSIVE METABOLIC PANEL
ALT: 23 U/L (ref 0–44)
AST: 20 U/L (ref 15–41)
Albumin: 3.5 g/dL (ref 3.5–5.0)
Alkaline Phosphatase: 85 U/L (ref 38–126)
Anion gap: 8 (ref 5–15)
BUN: 25 mg/dL — ABNORMAL HIGH (ref 6–20)
CO2: 28 mmol/L (ref 22–32)
Calcium: 9.1 mg/dL (ref 8.9–10.3)
Chloride: 100 mmol/L (ref 98–111)
Creatinine, Ser: 1.21 mg/dL — ABNORMAL HIGH (ref 0.44–1.00)
GFR calc Af Amer: 59 mL/min — ABNORMAL LOW (ref >60)
GFR calc non Af Amer: 51 mL/min — ABNORMAL LOW (ref >60)
Glucose, Bld: 289 mg/dL — ABNORMAL HIGH (ref 70–99)
Potassium: 4.7 mmol/L (ref 3.5–5.1)
Sodium: 136 mmol/L (ref 135–145)
Total Bilirubin: 0.5 mg/dL (ref 0.3–1.2)
Total Protein: 6.5 g/dL (ref 6.5–8.1)

## 2019-10-09 LAB — RESPIRATORY PANEL BY RT PCR (FLU A&B, COVID)
Influenza A by PCR: NEGATIVE
Influenza B by PCR: NEGATIVE
SARS Coronavirus 2 by RT PCR: NEGATIVE

## 2019-10-09 LAB — GLUCOSE, CAPILLARY: Glucose-Capillary: 262 mg/dL — ABNORMAL HIGH (ref 70–99)

## 2019-10-09 LAB — TROPONIN I (HIGH SENSITIVITY)
Troponin I (High Sensitivity): <2 ng/L (ref <18)
Troponin I (High Sensitivity): 2 ng/L (ref <18)

## 2019-10-09 LAB — BRAIN NATRIURETIC PEPTIDE: B Natriuretic Peptide: 30 pg/mL (ref 0.0–100.0)

## 2019-10-09 MED ORDER — METOCLOPRAMIDE HCL 5 MG/ML IJ SOLN
10.0000 mg | Freq: Once | INTRAMUSCULAR | Status: AC
  Administered 2019-10-09: 20:00:00 10 mg via INTRAVENOUS
  Filled 2019-10-09: qty 2

## 2019-10-09 MED ORDER — BUTALBITAL-APAP-CAFFEINE 50-325-40 MG PO TABS
1.0000 | ORAL_TABLET | Freq: Once | ORAL | Status: AC
  Administered 2019-10-09: 20:00:00 1 via ORAL
  Filled 2019-10-09: qty 1

## 2019-10-09 MED ORDER — SODIUM CHLORIDE 0.9 % IV BOLUS
500.0000 mL | Freq: Once | INTRAVENOUS | Status: AC
  Administered 2019-10-09: 500 mL via INTRAVENOUS

## 2019-10-09 MED ORDER — SODIUM CHLORIDE 0.9 % IV BOLUS
500.0000 mL | Freq: Once | INTRAVENOUS | Status: AC
  Administered 2019-10-09: 19:00:00 500 mL via INTRAVENOUS

## 2019-10-09 NOTE — Discharge Instructions (Addendum)
Thank you for letting us take care of you in the emergency department today.   Please continue to take any regular, prescribed medications. With regards to your blood pressure medicines, check your blood pressure daily. If it is higher than 160/90 go ahead and take your losartan. If it is less than 160/90 you should NOT take your losartan.   Please stay well hydrated and continue to eat a healthy diet.  Please follow up with: - Your primary care doctor to review your ER visit and follow up on your symptoms.    Please return to the ER for any new or worsening symptoms.

## 2019-10-09 NOTE — ED Provider Notes (Signed)
[facility] Emergency Department Provider Note  ____________________________________________   First MD Initiated Contact with Patient 10/09/19 1724     (approximate)  I have reviewed the triage vital signs and the nursing notes.  History  Chief Complaint Hyperglycemia    HPI Amanda [name] is a 54 y.o. female past medical history as below, including HF, DM, GERD, CVA who presents to the emergency department for multiple complaints.  Patient states she woke up this morning and felt generally unwell.  Throughout the day she developed some intermittent double vision, lightheadedness, headache, shortness of breath, chest pain, abdominal pain, and an episode of loose stool.  Patient states while these symptoms all started this morning, she has had multiple episodes of similar symptoms over the last several weeks.  Was admitted for similar symptoms at the end of December, primary etiology thought to be hypotension, primarily due to her medications, which she was taken off of. She was, however, restarted on losartan shortly afterwards.  Patient also reports retaining some fluid in her abdominal area due to her hx of HF.  She denies any fevers or known sick contacts.  Glucose with EMS 479, received fluids, on arrival to the ED 276.   Past Medical Hx Past Medical History:  Diagnosis Date  . Arthritis    knees, shoulder, upper back  . Asthma   . CHF (congestive heart failure) (HCC)   . Diabetes mellitus without complication (HCC)   . Environmental allergies   . Fibromyalgia   . GERD (gastroesophageal reflux disease)   . GI bleed 10/02/2018  . Headache    migraines - 5x/mo  . Hypertension   . Hypokalemia 10/14/2017  . Migraines   . Motion sickness    ships  . Sleep apnea   . Stroke [facility])    no residual deficits  . Thyroid nodule    bilateral and goiter  . Wears contact lenses   . Wears dentures    partial upper    Problem List Patient Active  Problem List   Diagnosis Date Noted  . Syncope 08/27/2019  . Syncope and collapse 08/26/2019  . Asthma 07/13/2019  . Hypotension 10/10/2018  . Acute on chronic diastolic (congestive) heart failure (HCC) 07/17/2018  . Acute on chronic heart failure (HCC) 07/08/2018  . Chronic diastolic heart failure (HCC) 04/17/2018  . Lymphedema 04/17/2018  . Chest pain 03/28/2018  . Hypertension 10/15/2017  . Wears dentures 10/15/2017  . Symptomatic cholelithiasis 04/02/2017  . Recurrent incisional hernia 03/17/2017  . Calculus of gallbladder without cholecystitis without obstruction 03/17/2017  . Helicobacter pylori gastritis 03/17/2017  . Depression 09/28/2016  . Diabetes mellitus, type 2 (HCC) 09/28/2016  . Tremor 07/01/2013  . Esophageal reflux 06/22/2012  . Major depressive disorder, single episode 08/22/2009  . Sleep apnea 05/30/2009  . Hyperlipidemia 08/10/2008    Past Surgical Hx Past Surgical History:  Procedure Laterality Date  . ABDOMINAL HYSTERECTOMY  2000's  . ABDOMINAL SURGERY     laparoscopy x4 with lysis of adhesions  . APPENDECTOMY  1986  . CESAREAN SECTION  1992  . CHOLECYSTECTOMY N/A 04/03/2017   Procedure: LAPAROSCOPIC CHOLECYSTECTOMY;  Surgeon: Henrene Dodge, MD;  Location: [facility] ORS;  Service: General;  Laterality: N/A;  . COLONOSCOPY N/A 10/06/2018   Procedure: COLONOSCOPY;  Surgeon: Toney Reil, MD;  Location: [facility] ENDOSCOPY;  Service: Gastroenterology;  Laterality: N/A;  . COLONOSCOPY WITH PROPOFOL N/A 03/10/2017   Procedure: COLONOSCOPY WITH PROPOFOL;  Surgeon: Scot Jun, MD;  Location:  [place] ENDOSCOPY;  Service: Endoscopy;  Laterality: N/A;  . ECTOPIC PREGNANCY SURGERY  2000's  . ESOPHAGOGASTRODUODENOSCOPY (EGD) WITH PROPOFOL N/A 03/10/2017   Procedure: ESOPHAGOGASTRODUODENOSCOPY (EGD) WITH PROPOFOL;  Surgeon: Manya Silvas, MD;  Location: [facility] ENDOSCOPY;  Service: Endoscopy;  Laterality: N/A;  . HERNIA REPAIR    . JOINT REPLACEMENT Right 12/16/12    knee- medial - makoplasty  . KNEE ARTHROSCOPY Right 2006   partial medial and lateral meniscectomies  . KNEE ARTHROSCOPY Right 10/06/2015   Procedure: RIGHT KNEE ARTHROSCOPY WITH DEBRIDEMENT;  Surgeon: Leanor Kail, MD;  Location: [place];  Service: Orthopedics;  Laterality: Right;  Diabetic - insulin and oral meds  . ROTATOR CUFF REPAIR Left 2006  . TUBAL LIGATION  2000's    Medications Prior to Admission medications   Medication Sig Start Date End Date Taking? Authorizing Provider  acetaminophen (TYLENOL) 325 MG tablet Take 2 tablets (650 mg total) by mouth every 6 (six) hours as needed for mild pain (or Fever >/= 101). 08/27/19   Jennye Boroughs, MD  albuterol (PROVENTIL HFA) 108 (90 Base) MCG/ACT inhaler Inhale 2 puffs into the lungs every 4 (four) hours as needed for wheezing or shortness of breath. 07/13/19   Parrett, Fonnie Mu, NP  Ascorbic Acid (VITAMIN C) 1000 MG tablet Take 500 mg by mouth 2 (two) times daily.     [provider]  DULoxetine (CYMBALTA) 60 MG capsule Take 60 mg by mouth 2 (two) times daily.    [provider]  fluticasone furoate-vilanterol (BREO ELLIPTA) 200-25 MCG/INH AEPB Inhale 1 puff into the lungs daily. 1 puff daily. 07/14/19   Parrett, Fonnie Mu, NP  furosemide (LASIX) 40 MG tablet Take 1 tablet (40 mg total) by mouth daily. 08/27/19   Jennye Boroughs, MD  gabapentin (NEURONTIN) 300 MG capsule Take 600 mg by mouth 2 (two) times daily. 03/22/18   [provider]  Insulin Glargine, 2 Unit Dial, (TOUJEO MAX SOLOSTAR) 300 UNIT/ML SOPN Inject 52 Units into the skin daily.    [provider]  ipratropium-albuterol (DUONEB) 0.5-2.5 (3) MG/3ML SOLN Inhale 3 mLs into the lungs 3 (three) times daily as needed (shortness of breath, wheezing or cough). 07/13/19   Parrett, Fonnie Mu, NP  LINZESS 290 MCG CAPS capsule Take 1 tablet by mouth daily before breakfast.  02/28/17   [provider]  liraglutide (VICTOZA) 18 MG/3ML SOPN  Inject into the skin daily.    [provider]  losartan (COZAAR) 100 MG tablet Take 1 tablet (100 mg total) by mouth daily. 09/28/19 11/27/19  Arta Silence, MD  mirtazapine (REMERON SOL-TAB) 30 MG disintegrating tablet Take 30 mg by mouth daily.  03/18/18   [provider]  montelukast (SINGULAIR) 10 MG tablet Take 1 tablet (10 mg total) by mouth at bedtime. 07/13/19 07/12/20  Parrett, Fonnie Mu, NP  Multiple Vitamin (MULTIVITAMIN) tablet Take 1 tablet by mouth daily.    [provider]  omeprazole (PRILOSEC) 40 MG capsule Take 1 capsule (40 mg total) by mouth daily. 1 hr before supper 04/05/17   Piscoya, Jacqulyn Bath, MD  Potassium Chloride ER 20 MEQ TBCR Take 20 mEq by mouth daily. Take with lasix 09/30/18   [provider]  pravastatin (PRAVACHOL) 80 MG tablet Take 1 tablet (80 mg total) by mouth daily. 10/17/17 09/02/19  Johnson, Clanford L, MD  senna-docusate (SENOKOT-S) 8.6-50 MG tablet Take 1 tablet by mouth 2 (two) times daily. 10/06/18   Vaughan Basta, MD  sitaGLIPtin (JANUVIA) 25 MG tablet  Take 25 mg by mouth daily.    [provider]  SUMAtriptan (IMITREX) 25 MG tablet Take 25 mg by mouth every 2 (two) hours as needed for migraine. May repeat in 2 hours if headache persists or recurs.    [provider]  tiotropium (SPIRIVA HANDIHALER) 18 MCG inhalation capsule Place 1 capsule (18 mcg total) into inhaler and inhale daily. 07/13/19 10/11/19  Parrett, Virgel Bouquet, NP  tiZANidine (ZANAFLEX) 4 MG tablet Take 4 mg by mouth every 8 (eight) hours.     [provider]  topiramate (TOPAMAX) 50 MG tablet Take 50 mg by mouth 2 (two) times daily.  03/16/19   [provider]  Vitamin D, Ergocalciferol, (DRISDOL) 1.25 MG (50000 UT) CAPS capsule Take 1 capsule (50,000 Units total) by mouth every 7 (seven) days. 08/09/19   Toney Reil, MD  zolpidem (AMBIEN) 10 MG tablet Take 10 mg by mouth at bedtime.     [provider]     Allergies Penicillins and Ace inhibitors  Family Hx Family History  Problem Relation Age of Onset  . Hypertension Mother   . Hypertension Father   . Diabetes Father   . Allergies Father   . Kidney disease Father   . Breast cancer Neg Hx     Social Hx Social History   Tobacco Use  . Smoking status: Never Smoker  . Smokeless tobacco: Never Used  Substance Use Topics  . Alcohol use: No  . Drug use: No     Review of Systems  Constitutional: Negative for fever, chills.  Positive for feeling unwell. Eyes: Positive for visual changes. ENT: Negative for sore throat. Cardiovascular: Positive for chest pain. Respiratory: Positive for shortness of breath. Gastrointestinal: Positive for abdominal distention, loose stool. Genitourinary: Negative for dysuria. Musculoskeletal: Negative for leg swelling. Skin: Negative for rash. Neurological: Positive for headaches.   Physical Exam  Vital Signs: ED Triage Vitals  Enc Vitals Group     BP 10/09/19 1729 (!) 94/53     Pulse Rate 10/09/19 1729 88     Resp 10/09/19 1729 18     Temp 10/09/19 1729 99.1 F (37.3 C)     Temp Source 10/09/19 1729 Oral     SpO2 10/09/19 1729 97 %     Weight 10/09/19 1730 198 lb (89.8 kg)     Height 10/09/19 1730 5\' 5"  (1.651 m)     Head Circumference --      Peak Flow --      Pain Score 10/09/19 1729 10     Pain Loc --      Pain Edu? --      Excl. in GC? --     Constitutional: Alert and oriented.  Head: Normocephalic. Atraumatic. Eyes: Conjunctivae clear. Sclera anicteric. Nose: No congestion. No rhinorrhea. Mouth/Throat: Wearing mask.  Neck: No stridor.   Cardiovascular: Normal rate, regular rhythm. Extremities well perfused. Respiratory: Normal respiratory effort.  Lungs CTAB. Gastrointestinal: Soft.  Abdomen mildly protuberant but not frankly distended or tympanitic.  Nontender. Musculoskeletal: Trace lower extremity edema. No deformities. Neurologic:  Normal speech and language.  No gross focal neurologic deficits are appreciated.  Skin: Skin is warm, dry and intact. No rash noted. Psychiatric: Mood and affect are appropriate for situation.  EKG  Personally reviewed.   Rate: 90 Rhythm: sinus Axis: normal Intervals: WNL No acute ischemia No STEMI    Radiology  CXR: IMPRESSION:  Cardiomegaly with mild, diffuse bilateral interstitial pulmonary  opacity, likely edema. No  focal airspace opacity.   CT head:  IMPRESSION:  Unremarkable study. No acute intracranial abnormality.   Procedures  Procedure(s) performed (including critical care):  Procedures   Initial Impression / Assessment and Plan / ED Course  54 y.o. female who presents to the ED for multiple medical complaints, as above.  Including headache, double vision, chest pain, shortness of breath, loose stool.  Ddx: dehydration/orthostatic, electrolyte abnormality, COVID, hypo/hyperglycemia, AKI  Will evaluate with labs, imaging, swab, reassess.   Work-up largely unremarkable.  EKG without acute ischemia, troponin x 2 negative.  BNP within normal limits.  No consolidation on XR.  COVID negative.  Mild increase in creatinine compared to baseline, suspect large component of dehydration contributing to presentation today.  Receiving IV fluids.  Patient feeling improved after fluids and headache control.  Pressure has remained stable.  As such, patient appropriate for discharge with outpatient follow-up.  Discussed holding her losartan if blood pressure remains lower than 160/90 on daily checks, and close follow-up with PCP to discuss her medication regimen.  Advised continued healthy diet and adequate hydration.  Patient voices understanding and is comfortable with the plan and discharge.  Given return precautions.   Final Clinical Impression(s) / ED Diagnosis  Final diagnoses:  Feeling unwell  Multiple complaints       Note:  This document was prepared using Dragon voice recognition  software and may include unintentional dictation errors.   Miguel Aschoff., MD 10/09/19 [phone]

## 2019-10-09 NOTE — ED Triage Notes (Addendum)
Pt via EMS from home. Pt states she been having blurry vision, headache, and weakness that started today. Per EMS, pt has been on and off her BP medication because fluctuation in her BP.  Pt has recently been off her BP meds last week, BP on arrival is 94/53.[name] states they had a CBG of 479. On arrival, CBG was 276. Pt A&Ox4 and NAD.

## 2019-10-09 NOTE — ED Notes (Signed)
Note pox not 79%, entered in error. Pox 96% on ra. resps 18, 29 entered in error.

## 2019-10-12 DIAGNOSIS — E1165 Type 2 diabetes mellitus with hyperglycemia: Secondary | ICD-10-CM | POA: Diagnosis not present

## 2019-10-12 DIAGNOSIS — I10 Essential (primary) hypertension: Secondary | ICD-10-CM | POA: Diagnosis not present

## 2019-10-13 DIAGNOSIS — E1165 Type 2 diabetes mellitus with hyperglycemia: Secondary | ICD-10-CM | POA: Diagnosis not present

## 2019-10-13 DIAGNOSIS — I509 Heart failure, unspecified: Secondary | ICD-10-CM | POA: Diagnosis not present

## 2019-10-15 DIAGNOSIS — H40003 Preglaucoma, unspecified, bilateral: Secondary | ICD-10-CM | POA: Diagnosis not present

## 2019-10-15 DIAGNOSIS — E119 Type 2 diabetes mellitus without complications: Secondary | ICD-10-CM | POA: Diagnosis not present

## 2019-10-19 ENCOUNTER — Ambulatory Visit (INDEPENDENT_AMBULATORY_CARE_PROVIDER_SITE_OTHER): Payer: Managed Care, Other (non HMO) | Admitting: Family Medicine

## 2019-10-19 ENCOUNTER — Ambulatory Visit (INDEPENDENT_AMBULATORY_CARE_PROVIDER_SITE_OTHER): Payer: Medicaid Other | Admitting: Family Medicine

## 2019-10-19 ENCOUNTER — Emergency Department
Admission: EM | Admit: 2019-10-19 | Discharge: 2019-10-20 | Disposition: A | Discharge status: Home / Self Care | Payer: Medicare HMO | Attending: Emergency Medicine | Admitting: Emergency Medicine

## 2019-10-19 ENCOUNTER — Encounter (INDEPENDENT_AMBULATORY_CARE_PROVIDER_SITE_OTHER): Payer: Self-pay | Admitting: Family Medicine

## 2019-10-19 ENCOUNTER — Other Ambulatory Visit: Payer: Self-pay

## 2019-10-19 VITALS — BP 158/82 | HR 79 | Temp 98.7°F | Ht 65.0 in | Wt 212.0 lb

## 2019-10-19 DIAGNOSIS — R739 Hyperglycemia, unspecified: Secondary | ICD-10-CM

## 2019-10-19 DIAGNOSIS — I5032 Chronic diastolic (congestive) heart failure: Secondary | ICD-10-CM | POA: Diagnosis not present

## 2019-10-19 DIAGNOSIS — R5382 Chronic fatigue, unspecified: Secondary | ICD-10-CM | POA: Insufficient documentation

## 2019-10-19 DIAGNOSIS — F3289 Other specified depressive episodes: Secondary | ICD-10-CM

## 2019-10-19 DIAGNOSIS — Z888 Allergy status to other drugs, medicaments and biological substances status: Secondary | ICD-10-CM | POA: Insufficient documentation

## 2019-10-19 DIAGNOSIS — E559 Vitamin D deficiency, unspecified: Secondary | ICD-10-CM | POA: Diagnosis not present

## 2019-10-19 DIAGNOSIS — E119 Type 2 diabetes mellitus without complications: Secondary | ICD-10-CM

## 2019-10-19 DIAGNOSIS — Z9189 Other specified personal risk factors, not elsewhere classified: Secondary | ICD-10-CM

## 2019-10-19 DIAGNOSIS — I110 Hypertensive heart disease with heart failure: Secondary | ICD-10-CM | POA: Diagnosis not present

## 2019-10-19 DIAGNOSIS — R0902 Hypoxemia: Secondary | ICD-10-CM | POA: Diagnosis not present

## 2019-10-19 DIAGNOSIS — I10 Essential (primary) hypertension: Secondary | ICD-10-CM | POA: Diagnosis not present

## 2019-10-19 DIAGNOSIS — E1165 Type 2 diabetes mellitus with hyperglycemia: Secondary | ICD-10-CM | POA: Diagnosis not present

## 2019-10-19 DIAGNOSIS — M797 Fibromyalgia: Secondary | ICD-10-CM

## 2019-10-19 DIAGNOSIS — J45909 Unspecified asthma, uncomplicated: Secondary | ICD-10-CM | POA: Insufficient documentation

## 2019-10-19 DIAGNOSIS — Z880 Allergy status to penicillin: Secondary | ICD-10-CM | POA: Insufficient documentation

## 2019-10-19 DIAGNOSIS — E039 Hypothyroidism, unspecified: Secondary | ICD-10-CM | POA: Insufficient documentation

## 2019-10-19 DIAGNOSIS — Z794 Long term (current) use of insulin: Secondary | ICD-10-CM

## 2019-10-19 DIAGNOSIS — Z6835 Body mass index (BMI) 35.0-35.9, adult: Secondary | ICD-10-CM

## 2019-10-19 DIAGNOSIS — R5383 Other fatigue: Secondary | ICD-10-CM | POA: Diagnosis not present

## 2019-10-19 DIAGNOSIS — Z743 Need for continuous supervision: Secondary | ICD-10-CM | POA: Diagnosis not present

## 2019-10-19 DIAGNOSIS — G4719 Other hypersomnia: Secondary | ICD-10-CM

## 2019-10-19 DIAGNOSIS — R55 Syncope and collapse: Secondary | ICD-10-CM | POA: Diagnosis not present

## 2019-10-19 DIAGNOSIS — R0602 Shortness of breath: Secondary | ICD-10-CM

## 2019-10-19 DIAGNOSIS — Z79899 Other long term (current) drug therapy: Secondary | ICD-10-CM | POA: Insufficient documentation

## 2019-10-19 DIAGNOSIS — R69 Illness, unspecified: Secondary | ICD-10-CM | POA: Diagnosis not present

## 2019-10-19 LAB — BASIC METABOLIC PANEL
Anion gap: 10 (ref 5–15)
BUN: 15 mg/dL (ref 6–20)
CO2: 27 mmol/L (ref 22–32)
Calcium: 9.1 mg/dL (ref 8.9–10.3)
Chloride: 96 mmol/L — ABNORMAL LOW (ref 98–111)
Creatinine, Ser: 1.54 mg/dL — ABNORMAL HIGH (ref 0.44–1.00)
GFR calc Af Amer: 44 mL/min — ABNORMAL LOW (ref >60)
GFR calc non Af Amer: 38 mL/min — ABNORMAL LOW (ref >60)
Glucose, Bld: 365 mg/dL — ABNORMAL HIGH (ref 70–99)
Potassium: 4.5 mmol/L (ref 3.5–5.1)
Sodium: 133 mmol/L — ABNORMAL LOW (ref 135–145)

## 2019-10-19 LAB — CBC
HCT: 37 % (ref 36.0–46.0)
Hemoglobin: 11.8 g/dL — ABNORMAL LOW (ref 12.0–15.0)
MCH: 29.5 pg (ref 26.0–34.0)
MCHC: 31.9 g/dL (ref 30.0–36.0)
MCV: 92.5 fL (ref 80.0–100.0)
Platelets: 323 10*3/uL (ref 150–400)
RBC: 4 MIL/uL (ref 3.87–5.11)
RDW: 13.5 % (ref 11.5–15.5)
WBC: 6.2 10*3/uL (ref 4.0–10.5)
nRBC: 0 % (ref 0.0–0.2)

## 2019-10-19 LAB — GLUCOSE, CAPILLARY: Glucose-Capillary: 327 mg/dL — ABNORMAL HIGH (ref 70–99)

## 2019-10-19 MED ORDER — SODIUM CHLORIDE 0.9 % IV SOLN: 1000.0000 mL | Freq: Once | INTRAVENOUS | Status: DC

## 2019-10-19 NOTE — ED Provider Notes (Signed)
[facility] Emergency Department Provider Note   ____________________________________________    I have reviewed the triage vital signs and the nursing notes.   HISTORY  Chief Complaint Loss of Consciousness     HPI Amanda [name] is a 54 y.o. female who presents after syncopal episode.  Patient has history of CHF, has struggled with blood pressure control over the last year.  Admission in December for hypotension, at that time her blood pressure medications were held however then her blood pressure became too high per patient.  Today she got up off the couch to go to the kitchen felt lightheaded and apparently syncopized.  She denies injury.  No chest pain or palpitations.  No nausea or vomiting.  She reports this is similar to prior episodes.  Past Medical History:  Diagnosis Date  . ADD (attention deficit disorder)   . Anxiety   . Arthritis    knees, shoulder, upper back  . Asthma   . CFS (chronic fatigue syndrome)   . Chewing difficulty   . CHF (congestive heart failure) (HCC)   . Constipation   . Depression   . Diabetes mellitus without complication (HCC)   . Dyspnea   . Environmental allergies   . Fatty liver   . Fibromyalgia   . GERD (gastroesophageal reflux disease)   . GI bleed 10/02/2018  . Headache    migraines - 5x/mo  . Hypertension   . Hypokalemia 10/14/2017  . Hypothyroidism   . IBS (irritable bowel syndrome)   . Joint pain   . Lower extremity edema   . Migraines   . Motion sickness    ships  . Osteoarthritis   . Other specified disorders of thyroid   . Sleep apnea   . Stroke [facility])    no residual deficits  . Swallowing difficulty   . Thyroid nodule    bilateral and goiter  . Vitamin D deficiency   . Wears contact lenses   . Wears dentures    partial upper    Patient Active Problem List   Diagnosis Date Noted  . Syncope 08/27/2019  . Syncope and collapse 08/26/2019  . Asthma 07/13/2019  . Hypotension  10/10/2018  . Acute on chronic diastolic (congestive) heart failure (HCC) 07/17/2018  . Acute on chronic heart failure (HCC) 07/08/2018  . Chronic diastolic heart failure (HCC) 04/17/2018  . Lymphedema 04/17/2018  . Chest pain 03/28/2018  . Hypertension 10/15/2017  . Wears dentures 10/15/2017  . Symptomatic cholelithiasis 04/02/2017  . Recurrent incisional hernia 03/17/2017  . Calculus of gallbladder without cholecystitis without obstruction 03/17/2017  . Helicobacter pylori gastritis 03/17/2017  . Depression 09/28/2016  . Diabetes mellitus, type 2 (HCC) 09/28/2016  . Tremor 07/01/2013  . Esophageal reflux 06/22/2012  . Major depressive disorder, single episode 08/22/2009  . Sleep apnea 05/30/2009  . Hyperlipidemia 08/10/2008    Past Surgical History:  Procedure Laterality Date  . ABDOMINAL HYSTERECTOMY  2000's  . ABDOMINAL SURGERY     laparoscopy x4 with lysis of adhesions  . APPENDECTOMY  1986  . CESAREAN SECTION  1992  . CHOLECYSTECTOMY N/A 04/03/2017   Procedure: LAPAROSCOPIC CHOLECYSTECTOMY;  Surgeon: Henrene Dodge, MD;  Location: [facility] ORS;  Service: General;  Laterality: N/A;  . COLONOSCOPY N/A 10/06/2018   Procedure: COLONOSCOPY;  Surgeon: Toney Reil, MD;  Location: [facility] ENDOSCOPY;  Service: Gastroenterology;  Laterality: N/A;  . COLONOSCOPY WITH PROPOFOL N/A 03/10/2017   Procedure: COLONOSCOPY WITH PROPOFOL;  Surgeon: Scot Jun,  MD;  Location: [facility] ENDOSCOPY;  Service: Endoscopy;  Laterality: N/A;  . ECTOPIC PREGNANCY SURGERY  2000's  . ESOPHAGOGASTRODUODENOSCOPY (EGD) WITH PROPOFOL N/A 03/10/2017   Procedure: ESOPHAGOGASTRODUODENOSCOPY (EGD) WITH PROPOFOL;  Surgeon: Manya Silvas, MD;  Location: [facility] ENDOSCOPY;  Service: Endoscopy;  Laterality: N/A;  . HERNIA REPAIR    . JOINT REPLACEMENT Right 12/16/12   knee- medial - makoplasty  . KNEE ARTHROSCOPY Right 2006   partial medial and lateral meniscectomies  . KNEE ARTHROSCOPY Right 10/06/2015   Procedure:  RIGHT KNEE ARTHROSCOPY WITH DEBRIDEMENT;  Surgeon: Leanor Kail, MD;  Location: [place];  Service: Orthopedics;  Laterality: Right;  Diabetic - insulin and oral meds  . ROTATOR CUFF REPAIR Left 2006  . TUBAL LIGATION  2000's    Prior to Admission medications   Medication Sig Start Date End Date Taking? Authorizing Provider  albuterol (PROVENTIL HFA) 108 (90 Base) MCG/ACT inhaler Inhale 2 puffs into the lungs every 4 (four) hours as needed for wheezing or shortness of breath. 07/13/19   Parrett, Tammy S, NP  fluticasone furoate-vilanterol (BREO ELLIPTA) 200-25 MCG/INH AEPB Inhale 1 puff into the lungs daily. 1 puff daily. 07/14/19   Parrett, Fonnie Mu, NP  furosemide (LASIX) 40 MG tablet Take 1 tablet (40 mg total) by mouth daily. 08/27/19   Jennye Boroughs, MD  gabapentin (NEURONTIN) 300 MG capsule Take 300 mg by mouth 2 (two) times daily.  03/22/18   [provider]  Insulin Glargine, 2 Unit Dial, (TOUJEO MAX SOLOSTAR) 300 UNIT/ML SOPN Inject 60 Units into the skin daily.     [provider]  ipratropium-albuterol (DUONEB) 0.5-2.5 (3) MG/3ML SOLN Inhale 3 mLs into the lungs 3 (three) times daily as needed (shortness of breath, wheezing or cough). 07/13/19   Parrett, Fonnie Mu, NP  LINZESS 290 MCG CAPS capsule Take 1 tablet by mouth daily before breakfast.  02/28/17   [provider]  liraglutide (VICTOZA) 18 MG/3ML SOPN Inject 1.2 mg into the skin daily.     [provider]  mirtazapine (REMERON SOL-TAB) 30 MG disintegrating tablet Take 30 mg by mouth at bedtime as needed (Nap).  03/18/18   [provider]  pravastatin (PRAVACHOL) 80 MG tablet Take 1 tablet (80 mg total) by mouth daily. 10/17/17 09/02/19  Johnson, Clanford L, MD  propranolol (INDERAL) 20 MG tablet Take 20 mg by mouth daily.    [provider]  sitaGLIPtin (JANUVIA) 100 MG tablet Take 100 mg by mouth daily.     [provider]  tiotropium (SPIRIVA HANDIHALER) 18  MCG inhalation capsule Place 1 capsule (18 mcg total) into inhaler and inhale daily. 07/13/19 10/19/19  Parrett, Fonnie Mu, NP  tiZANidine (ZANAFLEX) 4 MG tablet Take 4 mg by mouth every 8 (eight) hours.     [provider]     Allergies Penicillins and Ace inhibitors  Family History  Problem Relation Age of Onset  . Hypertension Mother   . Diabetes Mother   . Heart disease Mother   . Depression Mother   . Obesity Mother   . Hypertension Father   . Diabetes Father   . Allergies Father   . Kidney disease Father   . Cancer Father   . Breast cancer Neg Hx     Social History Social History   Tobacco Use  . Smoking status: Never Smoker  . Smokeless tobacco: Never Used  Substance Use Topics  . Alcohol use: No  . Drug use: No  Review of Systems  Constitutional: No fever/chills Eyes: No visual changes.  ENT: No sore throat. Cardiovascular: As above Respiratory: Denies shortness of breath. Gastrointestinal: No abdominal pain.  No nausea, no vomiting.   Genitourinary: Negative for dysuria. Musculoskeletal: Negative for back pain. Skin: Negative for rash. Neurological: Negative for headaches or weakness   ____________________________________________   PHYSICAL EXAM:  VITAL SIGNS: ED Triage Vitals  Enc Vitals Group     BP 10/19/19 2313 (!) 65/48     Pulse Rate 10/19/19 2305 82     Resp 10/19/19 2305 16     Temp 10/19/19 2305 97.9 F (36.6 C)     Temp src --      SpO2 10/19/19 2305 95 %     Weight 10/19/19 2303 96.2 kg (212 lb)     Height 10/19/19 2303 1.651 m (5\' 5" )     Head Circumference --      Peak Flow --      Pain Score 10/19/19 2302 4     Pain Loc --      Pain Edu? --      Excl. in GC? --     Constitutional: Alert and oriented.   Nose: No congestion/rhinnorhea. Mouth/Throat: Mucous membranes are moist.    Cardiovascular: Normal rate, regular rhythm. Grossly normal heart sounds.  Good peripheral circulation. Respiratory: Normal  respiratory effort.  No retractions. Lungs CTAB. Gastrointestinal: Soft and nontender. No distention.  No CVA tenderness.  Musculoskeletal: Mild edema bilaterally warm and well perfused Neurologic:  Normal speech and language. No gross focal neurologic deficits are appreciated.  Skin:  Skin is warm, dry and intact. No rash noted. Psychiatric: Mood and affect are normal. Speech and behavior are normal.  ____________________________________________   LABS (all labs ordered are listed, but only abnormal results are displayed)  Labs Reviewed  BASIC METABOLIC PANEL - Abnormal; Notable for the following components:      Result Value   Sodium 133 (*)    Chloride 96 (*)    Glucose, Bld 365 (*)    Creatinine, Ser 1.54 (*)    GFR calc non Af Amer 38 (*)    GFR calc Af Amer 44 (*)    All other components within normal limits  CBC - Abnormal; Notable for the following components:   Hemoglobin 11.8 (*)    All other components within normal limits  GLUCOSE, CAPILLARY - Abnormal; Notable for the following components:   Glucose-Capillary 327 (*)    All other components within normal limits  URINALYSIS, COMPLETE (UACMP) WITH MICROSCOPIC  CBG MONITORING, ED  TROPONIN I (HIGH SENSITIVITY)   ____________________________________________  EKG  ED ECG REPORT I, 10/21/19, the attending physician, personally viewed and interpreted this ECG.  Date: 10/19/2019  Rhythm: normal sinus rhythm QRS Axis: normal Intervals: normal ST/T Wave abnormalities: normal Narrative Interpretation: no evidence of acute ischemia  ____________________________________________  RADIOLOGY   ____________________________________________   PROCEDURES  Procedure(s) performed: No  Procedures   Critical Care performed: No ____________________________________________   INITIAL IMPRESSION / ASSESSMENT AND PLAN / ED COURSE  Pertinent labs & imaging results that were available during my care of the  patient were reviewed by me and considered in my medical decision making (see chart for details).  Patient presents with hypotension after syncopal episode.  However history of CHF, however will give IV fluids for blood pressure at this time and monitor carefully.  Question whether this may be related to kidney injury versus dehydration versus medication related.  Lab work is overall reassuring, mild elevation in creatinine and glucose, patient has received IV fluids.  Suspect symptoms may be related to poor glucose control causing dehydration coupled with medications.  Glucose is improved after fluids as well.  After IV fluids blood pressure has normalized, she feels well, orthostatics are normal.  Appropriate for discharge at this time, will need cardiology follow-up    ____________________________________________   FINAL CLINICAL IMPRESSION(S) / ED DIAGNOSES  Final diagnoses:  Syncope, unspecified syncope type  Hyperglycemia        Note:  This document was prepared using Dragon voice recognition software and may include unintentional dictation errors.   Jene Every, MD 10/20/19 [phone]

## 2019-10-19 NOTE — ED Triage Notes (Addendum)
Pt to ED via EMS from home. Per ems pt had syncopal episode while walking to kitchen, pt recently had same. Pt states her BP will be high one minute and then low the next. Pt arrives with bp 65/48. Pt denies pain states she feels weak. CBG 327

## 2019-10-19 NOTE — Progress Notes (Signed)
Dear Dr. Logan [name],   Thank you for referring Amanda [name] to our clinic. The following note includes my evaluation and treatment recommendations.  Chief Complaint:   Amanda [name] (MR# [id number]) is a 54 y.o. female who presents for evaluation and treatment of Amanda and related comorbidities. Current BMI is Body mass index is 35.28 kg/m. Amanda [name] has been struggling with her weight for many years and has been unsuccessful in either losing weight, maintaining weight loss, or reaching her healthy weight goal.  Amanda [name] is currently in the action stage of change and ready to dedicate time achieving and maintaining a healthier weight. Amanda [name] is interested in becoming our patient and working on intensive lifestyle modifications including (but not limited to) diet and exercise for weight loss.  Amanda [name]'s habits were reviewed today and are as follows: Her family eats meals together, she thinks her family will eat healthier with her, her desired weight loss is 55 pounds, she started gaining weight at age 69, her heaviest weight ever was 205 pounds, she is a picky eater, she craves grapes and fruits, she snacks frequently in the evenings, she wakes up frequently in the middle of the night to eat, she skips lunch frequently, she frequently makes poor food choices and she struggles with emotional eating.  Depression Screen Amanda [name]'s Food and Mood (modified PHQ-9) score was 24.  Depression screen PHQ 2/9 10/19/2019  Decreased Interest 3  Down, Depressed, Hopeless 3  PHQ - 2 Score 6  Altered sleeping 3  Tired, decreased energy 3  Change in appetite 3  Feeling bad or failure about yourself  2  Trouble concentrating 2  Moving slowly or fidgety/restless 3  Suicidal thoughts 2  PHQ-9 Score 24  Difficult doing work/chores Very difficult   Subjective:   1. Other fatigue Amanda [name] admits to daytime somnolence and denies waking up still tired. Patent has a history of symptoms of morning  fatigue and snoring. Amanda [name] generally gets 5 hours of sleep per night, and states that she has poor sleep quality. Snoring is present. Apneic episodes are present. Epworth Sleepiness Score is 12.  2. SOB (shortness of breath) on exertion Amanda [name] notes increasing shortness of breath with exercising and seems to be worsening over time with weight gain. She notes getting out of breath sooner with activity than she used to. This has gotten worse recently. Amanda [name] denies shortness of breath at rest or orthopnea.  3. Type 2 diabetes mellitus without complication, with long-term current use of insulin (HCC) Amanda [name] was diagnosed with diabetes 28 years ago this year.  She is on insulin.  She was also startd on Jardiance and Victoza last month.  These are managed by her PCP  Lab Results  Component Value Date   HGBA1C 11.7 (H) 08/05/2019   HGBA1C 7.6 (H) 10/14/2017   Lab Results  Component Value Date   LDLCALC 72 10/14/2017   CREATININE 1.21 (H) 10/09/2019   4. Excessive daytime sleepiness Amanda [name] has history of OSA.  She says she lost her CPAP machine and has not used it since last year.  Epworth score is 12 today.  5. Chronic diastolic heart failure (HCC) Amanda [name] takes Lasix 40 mg daily.  She eats out and eats prepackaged foods.  6. Fibromyalgia She is disabled.  7. Vitamin D deficiency Amanda [name] Vitamin D level was 11.4 on 08/05/2019. She is not currently taking vit D. She denies nausea, vomiting or muscle weakness.  8. Other depression, with emotional eating Amanda [name] is struggling  with emotional eating and using food for comfort to the extent that it is negatively impacting her health. She has been working on behavior modification techniques to help reduce her emotional eating and has been unsuccessful. She shows no sign of suicidal or homicidal ideations.  She is taking Cymbalta 60 mg daily and Remeron 30 mg daily.  9. At risk for hypoglycemia Amanda [name] is at increased risk for hypoglycemia due to  changes in diet, diagnosis of diabetes, and/or insulin use. Amanda [name] is currently taking insulin.   Assessment/Plan:   1. Other fatigue Amanda [name] does feel that her weight is causing her energy to be lower than it should be. Fatigue may be related to Amanda, depression or many other causes. Labs will be ordered, and in the meanwhile, Amanda [name] will focus on self care including making healthy food choices, increasing physical activity and focusing on stress reduction.  Orders - TSH - T4, free - T3 - Anemia panel  2. SOB (shortness of breath) on exertion Amanda [name] does feel that she gets out of breath more easily that she used to when she exercises. Amanda [name]'s shortness of breath appears to be Amanda related and exercise induced. She has agreed to work on weight loss and gradually increase exercise to treat her exercise induced shortness of breath. Will continue to monitor closely.  Orders - CBC with Differential/Platelet  3. Type 2 diabetes mellitus without complication, with long-term current use of insulin (HCC) Good blood sugar control is important to Amanda the likelihood of diabetic complications such as nephropathy, neuropathy, limb loss, blindness, coronary artery disease, and death. Intensive lifestyle modification including diet, exercise and weight loss are the first line of treatment for diabetes.   Orders - Comprehensive metabolic panel  4. Excessive daytime sleepiness Amanda [name] has been advised to let prescriber know that she needs a new CPAP.  5. Chronic diastolic heart failure (HCC) Amanda [name].  Consider spironolactone (based on labs).  Orders - Lipid panel - Magnesium  6. Fibromyalgia Intensive lifestyle modifications are the first line treatment for this issue. We discussed several lifestyle modifications today and she will continue to work on diet, exercise and weight loss efforts.We will continue to monitor. Orders and follow up as documented in patient record.    Counseling . Try [email or web address], which is a series of self-care modules designed to teach patients several techniques to manage pain.   7. Vitamin D deficiency Low Vitamin D level contributes to fatigue and are associated with Amanda, breast, and colon cancer.  Orders - VITAMIN D 25 Hydroxy (Vit-D Deficiency, Fractures)  8. Other depression, with emotional eating Patient was referred to Dr. Dewaine Conger, our Bariatric Psychologist, for evaluation due to her elevated PHQ-9 score and significant struggles with emotional eating.  9. At risk for hypoglycemia Amanda [name] was given approximately 15 minutes of counseling today regarding prevention of hypoglycemia. She was advised of symptoms of hypoglycemia. Amanda [name] was instructed to avoid skipping meals, eat regular protein rich meals and schedule low calorie snacks as needed.   Repetitive spaced learning was employed today to elicit superior memory formation and behavioral change  10. 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 I recommend Amanda [name] begin the structured treatment plan as follows:  She has agreed to the Category 2 Plan.  Exercise goals: No exercise has been prescribed at  this time.   Behavioral modification strategies: increasing lean protein intake, decreasing simple carbohydrates, increasing vegetables, increasing water intake, decreasing liquid calories, decreasing sodium intake and increasing high fiber foods.  She was informed of the importance of frequent follow-up visits to maximize her success with intensive lifestyle modifications for her multiple health conditions. She was informed we would discuss her lab results at her next visit unless there is a critical issue that needs to be addressed sooner. Amanda [name] agreed to keep her  next visit at the agreed upon time to discuss these results.  Objective:   Blood pressure (!) 158/82, pulse 79, temperature 98.7 F (37.1 C), temperature source Oral, height 5\' 5"  (1.651 m), weight 212 lb (96.2 kg), SpO2 97 %. Body mass index is 35.28 kg/m.  Indirect Calorimeter completed today shows a VO2 of 283 and a REE of 1972.  Her calculated basal metabolic rate is 5638 thus her basal metabolic rate is better than expected.  General: Cooperative, alert, well developed, in no acute distress. HEENT: Conjunctivae and lids unremarkable. Cardiovascular: Regular rhythm.  Lungs: Normal work of breathing. Neurologic: No focal deficits.   Lab Results  Component Value Date   CREATININE 1.21 (H) 10/09/2019   BUN 25 (H) 10/09/2019   NA 136 10/09/2019   K 4.7 10/09/2019   CL 100 10/09/2019   CO2 28 10/09/2019   Lab Results  Component Value Date   ALT 23 10/09/2019   AST 20 10/09/2019   ALKPHOS 85 10/09/2019   BILITOT 0.5 10/09/2019   Lab Results  Component Value Date   HGBA1C 11.7 (H) 08/05/2019   HGBA1C 7.6 (H) 10/14/2017   No results found for: INSULIN Lab Results  Component Value Date   TSH 1.089 10/04/2018   Lab Results  Component Value Date   CHOL 162 10/14/2017   HDL 73 10/14/2017   LDLCALC 72 10/14/2017   TRIG 86 10/14/2017   CHOLHDL 2.2 10/14/2017   Lab Results  Component Value Date   WBC 5.7 10/09/2019   HGB 11.4 (L) 10/09/2019   HCT 36.4 10/09/2019   MCV 95.8 10/09/2019   PLT 367 10/09/2019   Lab Results  Component Value Date   IRON 87 08/05/2019   TIBC 419 08/05/2019   FERRITIN 32 08/05/2019   Attestation Statements:   This is the patient's first visit at [facility]. The patient's NEW PATIENT PACKET was reviewed at length. Included in the packet: current and past health history, medications, allergies, ROS, gynecologic history (women only), surgical history, family history, social history, weight history, weight loss surgery  history (for those that have had weight loss surgery), nutritional evaluation, mood and food questionnaire, PHQ9, Epworth questionnaire, sleep habits questionnaire, patient life and health improvement goals questionnaire. These will all be scanned into the patient's chart under media.   During the visit, I independently reviewed the patient's EKG, bioimpedance scale results, and indirect calorimeter results. I used this information to tailor a meal plan for the patient that will help her to lose weight and will improve her Amanda-related conditions going forward. I performed a medically necessary appropriate examination and/or evaluation. I discussed the assessment and treatment plan with the patient. The patient was provided an opportunity to ask questions and all were answered. The patient agreed with the plan and demonstrated an understanding of the instructions. Labs were ordered at this visit and will be reviewed at the next visit unless more critical results need to be addressed immediately. Clinical information was updated and  documented in the EMR.   Time spent on visit including pre-visit chart review and post-visit care was 60 minutes.   A separate 15 minutes was spent on risk counseling (see above).   I, Insurance claims handler, CMA, am acting as Energy manager for [employer], DO.  I have reviewed the above documentation for accuracy and completeness, and I agree with the above. Helane Rima, DO

## 2019-10-20 DIAGNOSIS — R55 Syncope and collapse: Secondary | ICD-10-CM | POA: Diagnosis not present

## 2019-10-20 DIAGNOSIS — R609 Edema, unspecified: Secondary | ICD-10-CM | POA: Diagnosis not present

## 2019-10-20 DIAGNOSIS — I10 Essential (primary) hypertension: Secondary | ICD-10-CM | POA: Diagnosis not present

## 2019-10-20 DIAGNOSIS — E785 Hyperlipidemia, unspecified: Secondary | ICD-10-CM | POA: Diagnosis not present

## 2019-10-20 LAB — TROPONIN I (HIGH SENSITIVITY): Troponin I (High Sensitivity): 6 ng/L (ref <18)

## 2019-10-21 LAB — COMPREHENSIVE METABOLIC PANEL
ALT: 30 IU/L (ref 0–32)
AST: 18 IU/L (ref 0–40)
Albumin/Globulin Ratio: 1.5 (ref 1.2–2.2)
Albumin: 4.7 g/dL (ref 3.8–4.9)
Alkaline Phosphatase: 143 IU/L — ABNORMAL HIGH (ref 39–117)
BUN/Creatinine Ratio: 13 (ref 9–23)
BUN: 13 mg/dL (ref 6–24)
Bilirubin Total: <0.2 mg/dL (ref 0.0–1.2)
CO2: 23 mmol/L (ref 20–29)
Calcium: 10.7 mg/dL — ABNORMAL HIGH (ref 8.7–10.2)
Chloride: 99 mmol/L (ref 96–106)
Creatinine, Ser: 1.01 mg/dL — ABNORMAL HIGH (ref 0.57–1.00)
GFR calc Af Amer: 73 mL/min/{1.73_m2} (ref >59)
GFR calc non Af Amer: 63 mL/min/{1.73_m2} (ref >59)
Globulin, Total: 3.1 g/dL (ref 1.5–4.5)
Glucose: 167 mg/dL — ABNORMAL HIGH (ref 65–99)
Potassium: 4.5 mmol/L (ref 3.5–5.2)
Sodium: 142 mmol/L (ref 134–144)
Total Protein: 7.8 g/dL (ref 6.0–8.5)

## 2019-10-21 LAB — CBC WITH DIFFERENTIAL/PLATELET
Basophils Absolute: 0 10*3/uL (ref 0.0–0.2)
Basos: 1 %
EOS (ABSOLUTE): 0 10*3/uL (ref 0.0–0.4)
Eos: 1 %
Hemoglobin: 13.6 g/dL (ref 11.1–15.9)
Immature Grans (Abs): 0 10*3/uL (ref 0.0–0.1)
Immature Granulocytes: 1 %
Lymphocytes Absolute: 1.9 10*3/uL (ref 0.7–3.1)
Lymphs: 33 %
MCH: 29.6 pg (ref 26.6–33.0)
MCHC: 32.3 g/dL (ref 31.5–35.7)
MCV: 92 fL (ref 79–97)
Monocytes Absolute: 0.5 10*3/uL (ref 0.1–0.9)
Monocytes: 8 %
Neutrophils Absolute: 3.3 10*3/uL (ref 1.4–7.0)
Neutrophils: 56 %
Platelets: 359 10*3/uL (ref 150–450)
RBC: 4.6 x10E6/uL (ref 3.77–5.28)
RDW: 13.3 % (ref 11.7–15.4)
WBC: 5.8 10*3/uL (ref 3.4–10.8)

## 2019-10-21 LAB — LIPID PANEL
Chol/HDL Ratio: 2.8 ratio (ref 0.0–4.4)
Cholesterol, Total: 245 mg/dL — ABNORMAL HIGH (ref 100–199)
HDL: 87 mg/dL (ref >39)
LDL Chol Calc (NIH): 118 mg/dL — ABNORMAL HIGH (ref 0–99)
Triglycerides: 237 mg/dL — ABNORMAL HIGH (ref 0–149)
VLDL Cholesterol Cal: 40 mg/dL (ref 5–40)

## 2019-10-21 LAB — T3: T3, Total: 151 ng/dL (ref 71–180)

## 2019-10-21 LAB — T4, FREE: Free T4: 1.25 ng/dL (ref 0.82–1.77)

## 2019-10-21 LAB — ANEMIA PANEL
Ferritin: 61 ng/mL (ref 15–150)
Folate, Hemolysate: 397 ng/mL
Folate, RBC: 943 ng/mL (ref >498)
Hematocrit: 42.1 % (ref 34.0–46.6)
Iron Saturation: 19 % (ref 15–55)
Iron: 89 ug/dL (ref 27–159)
Retic Ct Pct: 2 % (ref 0.6–2.6)
Total Iron Binding Capacity: 459 ug/dL — ABNORMAL HIGH (ref 250–450)
UIBC: 370 ug/dL (ref 131–425)
Vitamin B-12: 514 pg/mL (ref 232–1245)

## 2019-10-21 LAB — VITAMIN D 25 HYDROXY (VIT D DEFICIENCY, FRACTURES): Vit D, 25-Hydroxy: 21.9 ng/mL — ABNORMAL LOW (ref 30.0–100.0)

## 2019-10-21 LAB — TSH: TSH: 2.06 u[IU]/mL (ref 0.450–4.500)

## 2019-10-21 LAB — MAGNESIUM: Magnesium: 2 mg/dL (ref 1.6–2.3)

## 2019-10-24 ENCOUNTER — Emergency Department: Payer: Medicare HMO

## 2019-10-24 ENCOUNTER — Other Ambulatory Visit: Payer: Self-pay

## 2019-10-24 ENCOUNTER — Emergency Department
Admission: EM | Admit: 2019-10-24 | Discharge: 2019-10-24 | Disposition: A | Discharge status: Home / Self Care | Payer: Medicare HMO | Attending: Student | Admitting: Student

## 2019-10-24 DIAGNOSIS — R031 Nonspecific low blood-pressure reading: Secondary | ICD-10-CM | POA: Insufficient documentation

## 2019-10-24 DIAGNOSIS — I110 Hypertensive heart disease with heart failure: Secondary | ICD-10-CM | POA: Insufficient documentation

## 2019-10-24 DIAGNOSIS — E039 Hypothyroidism, unspecified: Secondary | ICD-10-CM | POA: Insufficient documentation

## 2019-10-24 DIAGNOSIS — I959 Hypotension, unspecified: Secondary | ICD-10-CM | POA: Diagnosis not present

## 2019-10-24 DIAGNOSIS — I5032 Chronic diastolic (congestive) heart failure: Secondary | ICD-10-CM | POA: Insufficient documentation

## 2019-10-24 DIAGNOSIS — Z96651 Presence of right artificial knee joint: Secondary | ICD-10-CM | POA: Diagnosis not present

## 2019-10-24 DIAGNOSIS — Z79899 Other long term (current) drug therapy: Secondary | ICD-10-CM | POA: Diagnosis not present

## 2019-10-24 DIAGNOSIS — J45909 Unspecified asthma, uncomplicated: Secondary | ICD-10-CM | POA: Insufficient documentation

## 2019-10-24 DIAGNOSIS — R109 Unspecified abdominal pain: Secondary | ICD-10-CM | POA: Diagnosis not present

## 2019-10-24 DIAGNOSIS — E119 Type 2 diabetes mellitus without complications: Secondary | ICD-10-CM | POA: Insufficient documentation

## 2019-10-24 DIAGNOSIS — Z8673 Personal history of transient ischemic attack (TIA), and cerebral infarction without residual deficits: Secondary | ICD-10-CM | POA: Diagnosis not present

## 2019-10-24 DIAGNOSIS — Z743 Need for continuous supervision: Secondary | ICD-10-CM | POA: Diagnosis not present

## 2019-10-24 DIAGNOSIS — Z794 Long term (current) use of insulin: Secondary | ICD-10-CM | POA: Diagnosis not present

## 2019-10-24 DIAGNOSIS — R55 Syncope and collapse: Secondary | ICD-10-CM

## 2019-10-24 DIAGNOSIS — E1165 Type 2 diabetes mellitus with hyperglycemia: Secondary | ICD-10-CM | POA: Diagnosis not present

## 2019-10-24 LAB — CBC WITH DIFFERENTIAL/PLATELET
Abs Immature Granulocytes: 0.03 10*3/uL (ref 0.00–0.07)
Basophils Absolute: 0 10*3/uL (ref 0.0–0.1)
Basophils Relative: 1 %
Eosinophils Absolute: 0.1 10*3/uL (ref 0.0–0.5)
Eosinophils Relative: 1 %
HCT: 41.6 % (ref 36.0–46.0)
Hemoglobin: 13.3 g/dL (ref 12.0–15.0)
Immature Granulocytes: 1 %
Lymphocytes Relative: 37 %
Lymphs Abs: 2.1 10*3/uL (ref 0.7–4.0)
MCH: 29.8 pg (ref 26.0–34.0)
MCHC: 32 g/dL (ref 30.0–36.0)
MCV: 93.1 fL (ref 80.0–100.0)
Monocytes Absolute: 0.4 10*3/uL (ref 0.1–1.0)
Monocytes Relative: 7 %
Neutro Abs: 3.1 10*3/uL (ref 1.7–7.7)
Neutrophils Relative %: 53 %
Platelets: 300 10*3/uL (ref 150–400)
RBC: 4.47 MIL/uL (ref 3.87–5.11)
RDW: 14.1 % (ref 11.5–15.5)
WBC: 5.8 10*3/uL (ref 4.0–10.5)
nRBC: 0 % (ref 0.0–0.2)

## 2019-10-24 LAB — URINALYSIS, ROUTINE W REFLEX MICROSCOPIC
Bacteria, UA: NONE SEEN
Bilirubin Urine: NEGATIVE
Glucose, UA: >=500 mg/dL — AB
Hgb urine dipstick: NEGATIVE
Ketones, ur: NEGATIVE mg/dL
Leukocytes,Ua: NEGATIVE
Nitrite: NEGATIVE
Protein, ur: NEGATIVE mg/dL
Specific Gravity, Urine: 1.012 (ref 1.005–1.030)
pH: 5 (ref 5.0–8.0)

## 2019-10-24 LAB — COMPREHENSIVE METABOLIC PANEL
ALT: 33 U/L (ref 0–44)
AST: 42 U/L — ABNORMAL HIGH (ref 15–41)
Albumin: 4.2 g/dL (ref 3.5–5.0)
Alkaline Phosphatase: 101 U/L (ref 38–126)
Anion gap: 11 (ref 5–15)
BUN: 21 mg/dL — ABNORMAL HIGH (ref 6–20)
CO2: 25 mmol/L (ref 22–32)
Calcium: 9.3 mg/dL (ref 8.9–10.3)
Chloride: 102 mmol/L (ref 98–111)
Creatinine, Ser: 1.35 mg/dL — ABNORMAL HIGH (ref 0.44–1.00)
GFR calc Af Amer: 51 mL/min — ABNORMAL LOW (ref >60)
GFR calc non Af Amer: 44 mL/min — ABNORMAL LOW (ref >60)
Glucose, Bld: 248 mg/dL — ABNORMAL HIGH (ref 70–99)
Potassium: 3.8 mmol/L (ref 3.5–5.1)
Sodium: 138 mmol/L (ref 135–145)
Total Bilirubin: 0.6 mg/dL (ref 0.3–1.2)
Total Protein: 7.9 g/dL (ref 6.5–8.1)

## 2019-10-24 LAB — BRAIN NATRIURETIC PEPTIDE: B Natriuretic Peptide: 22 pg/mL (ref 0.0–100.0)

## 2019-10-24 LAB — PREGNANCY, URINE: Preg Test, Ur: NEGATIVE

## 2019-10-24 LAB — HCG, QUANTITATIVE, PREGNANCY: hCG, Beta Chain, Quant, S: 2 m[IU]/mL (ref <5)

## 2019-10-24 LAB — GLUCOSE, CAPILLARY: Glucose-Capillary: 221 mg/dL — ABNORMAL HIGH (ref 70–99)

## 2019-10-24 LAB — TROPONIN I (HIGH SENSITIVITY)
Troponin I (High Sensitivity): 3 ng/L (ref <18)
Troponin I (High Sensitivity): 3 ng/L (ref <18)

## 2019-10-24 LAB — LIPASE, BLOOD: Lipase: 33 U/L (ref 11–51)

## 2019-10-24 MED ORDER — LACTATED RINGERS IV BOLUS
1000.0000 mL | Freq: Once | INTRAVENOUS | Status: AC
  Administered 2019-10-24: 1000 mL via INTRAVENOUS

## 2019-10-24 NOTE — Discharge Instructions (Addendum)
You most likely had syncope due to low blood pressures from increased propanol.  DO NOT take it tomorrow. Call your doctor tomorrow to get meds checked.  Take a blood pressure in morning and afternoon and keep record for your primary doctor. Return to ER for any other concerns.

## 2019-10-24 NOTE — ED Notes (Signed)
Pt alert, states she still feels drowsy, no c/o pain, denies chest pain or pressure. Purewic placed.

## 2019-10-24 NOTE — ED Notes (Signed)
Pt states no pain but c/o pressure in her chest "from the fluid" per pt.. EDP notified, EKG and troponin done

## 2019-10-24 NOTE — ED Provider Notes (Signed)
Repeat troponin is negative and stable. VS, blood pressure improved. She has ambulated w/ steady gait w/o recurrence of symptoms. Patient reports feeling subjective improved as well.   Reinforced the need to hold her propranolol until discussing with her primary provider the correct dosage and amount.  She voices understanding of this and is comfortable with the plan and discharge.      Miguel Aschoff., MD 10/24/19 [phone]

## 2019-10-24 NOTE — ED Provider Notes (Signed)
[facility] Emergency Department Provider Note  ____________________________________________   First MD Initiated Contact with Patient 10/24/19 1323     (approximate)  I have reviewed the triage vital signs and the nursing notes.   HISTORY  Chief Complaint Loss of Consciousness    HPI [name] is a 54 y.o. female with CHF, difficult to control blood pressures with occasional hypertension and occasional low blood pressures who comes in for concerns for syncope.  Patient was seen on the couch and had a witnessed syncopal episode by her daughter.  Patient did not fall down the ground.  According to daughter patient was on 20 mg of propranolol and this was increased to 80 mg 1 week ago.  Patient is had low blood pressures before from dehydration and medications.  Patient states she feels at her baseline now, denies any new concerns, denies feeling lightheaded.  Does not remember the above episode.  Patient did not hit her head.  Patient reported some abdominal pain but states is more of just like some fluid in her abdomen.  Denies it feeling too different than her baseline.  Patient's last echo was in July 2019 and appeared normal.  Last admission for this was in December 2020 which seem to be related to her medications.  I did confirm with patient's daughter who witnessed it and she did not hit her head.  She denies that she has had any new symptoms prior to the fall.  She states that there new bottle of propranolol showed 80 mg which was higher than the baseline of 20 mg and they are concerned that that is what caused her to pass out given abdomen but 1/2-hour after ingesting.            Past Medical History:  Diagnosis Date  . ADD (attention deficit disorder)   . Anxiety   . Arthritis    knees, shoulder, upper back  . Asthma   . CFS (chronic fatigue syndrome)   . Chewing difficulty   . CHF (congestive heart failure) (HCC)   . Constipation   .  Depression   . Diabetes mellitus without complication (HCC)   . Dyspnea   . Environmental allergies   . Fatty liver   . Fibromyalgia   . GERD (gastroesophageal reflux disease)   . GI bleed 10/02/2018  . Headache    migraines - 5x/mo  . Hypertension   . Hypokalemia 10/14/2017  . Hypothyroidism   . IBS (irritable bowel syndrome)   . Joint pain   . Lower extremity edema   . Migraines   . Motion sickness    ships  . Osteoarthritis   . Other specified disorders of thyroid   . Sleep apnea   . Stroke [facility])    no residual deficits  . Swallowing difficulty   . Thyroid nodule    bilateral and goiter  . Vitamin D deficiency   . Wears contact lenses   . Wears dentures    partial upper    Patient Active Problem List   Diagnosis Date Noted  . Syncope 08/27/2019  . Syncope and collapse 08/26/2019  . Asthma 07/13/2019  . Hypotension 10/10/2018  . Acute on chronic diastolic (congestive) heart failure (HCC) 07/17/2018  . Acute on chronic heart failure (HCC) 07/08/2018  . Chronic diastolic heart failure (HCC) 04/17/2018  . Lymphedema 04/17/2018  . Chest pain 03/28/2018  . Hypertension 10/15/2017  . Wears dentures 10/15/2017  . Symptomatic cholelithiasis 04/02/2017  .  Recurrent incisional hernia 03/17/2017  . Calculus of gallbladder without cholecystitis without obstruction 03/17/2017  . Helicobacter pylori gastritis 03/17/2017  . Depression 09/28/2016  . Diabetes mellitus, type 2 (HCC) 09/28/2016  . Tremor 07/01/2013  . Esophageal reflux 06/22/2012  . Major depressive disorder, single episode 08/22/2009  . Sleep apnea 05/30/2009  . Hyperlipidemia 08/10/2008    Past Surgical History:  Procedure Laterality Date  . ABDOMINAL HYSTERECTOMY  2000's  . ABDOMINAL SURGERY     laparoscopy x4 with lysis of adhesions  . APPENDECTOMY  1986  . CESAREAN SECTION  1992  . CHOLECYSTECTOMY N/A 04/03/2017   Procedure: LAPAROSCOPIC CHOLECYSTECTOMY;  Surgeon: Henrene Dodge, MD;  Location: [facility]  ORS;  Service: General;  Laterality: N/A;  . COLONOSCOPY N/A 10/06/2018   Procedure: COLONOSCOPY;  Surgeon: Toney Reil, MD;  Location: [facility] ENDOSCOPY;  Service: Gastroenterology;  Laterality: N/A;  . COLONOSCOPY WITH PROPOFOL N/A 03/10/2017   Procedure: COLONOSCOPY WITH PROPOFOL;  Surgeon: Scot Jun, MD;  Location: [facility] ENDOSCOPY;  Service: Endoscopy;  Laterality: N/A;  . ECTOPIC PREGNANCY SURGERY  2000's  . ESOPHAGOGASTRODUODENOSCOPY (EGD) WITH PROPOFOL N/A 03/10/2017   Procedure: ESOPHAGOGASTRODUODENOSCOPY (EGD) WITH PROPOFOL;  Surgeon: Scot Jun, MD;  Location: [facility] ENDOSCOPY;  Service: Endoscopy;  Laterality: N/A;  . HERNIA REPAIR    . JOINT REPLACEMENT Right 12/16/12   knee- medial - makoplasty  . KNEE ARTHROSCOPY Right 2006   partial medial and lateral meniscectomies  . KNEE ARTHROSCOPY Right 10/06/2015   Procedure: RIGHT KNEE ARTHROSCOPY WITH DEBRIDEMENT;  Surgeon: Erin Sons, MD;  Location: [facility];  Service: Orthopedics;  Laterality: Right;  Diabetic - insulin and oral meds  . ROTATOR CUFF REPAIR Left 2006  . TUBAL LIGATION  2000's    Prior to Admission medications   Medication Sig Start Date End Date Taking? Authorizing Provider  albuterol (PROVENTIL HFA) 108 (90 Base) MCG/ACT inhaler Inhale 2 puffs into the lungs every 4 (four) hours as needed for wheezing or shortness of breath. 07/13/19   Parrett, Tammy S, NP  fluticasone furoate-vilanterol (BREO ELLIPTA) 200-25 MCG/INH AEPB Inhale 1 puff into the lungs daily. 1 puff daily. 07/14/19   Parrett, Virgel Bouquet, NP  furosemide (LASIX) 40 MG tablet Take 1 tablet (40 mg total) by mouth daily. 08/27/19   Lurene Shadow, MD  gabapentin (NEURONTIN) 300 MG capsule Take 300 mg by mouth 2 (two) times daily.  03/22/18   [provider]  Insulin Glargine, 2 Unit Dial, (TOUJEO MAX SOLOSTAR) 300 UNIT/ML SOPN Inject 60 Units into the skin daily.     [provider]  ipratropium-albuterol (DUONEB)  0.5-2.5 (3) MG/3ML SOLN Inhale 3 mLs into the lungs 3 (three) times daily as needed (shortness of breath, wheezing or cough). 07/13/19   Parrett, Virgel Bouquet, NP  LINZESS 290 MCG CAPS capsule Take 1 tablet by mouth daily before breakfast.  02/28/17   [provider]  liraglutide (VICTOZA) 18 MG/3ML SOPN Inject 1.2 mg into the skin daily.     [provider]  mirtazapine (REMERON SOL-TAB) 30 MG disintegrating tablet Take 30 mg by mouth at bedtime as needed (Nap).  03/18/18   [provider]  pravastatin (PRAVACHOL) 80 MG tablet Take 1 tablet (80 mg total) by mouth daily. 10/17/17 09/02/19  Johnson, Clanford L, MD  propranolol (INDERAL) 20 MG tablet Take 20 mg by mouth daily.    [provider]  sitaGLIPtin (JANUVIA) 100 MG tablet Take 100 mg by mouth daily.     [provider]  tiotropium (SPIRIVA HANDIHALER) 18 MCG inhalation capsule Place 1 capsule (18 mcg total) into inhaler and inhale daily. 07/13/19 10/19/19  Parrett, Virgel Bouquet, NP  tiZANidine (ZANAFLEX) 4 MG tablet Take 4 mg by mouth every 8 (eight) hours.     [provider]    Allergies Penicillins and Ace inhibitors  Family History  Problem Relation Age of Onset  . Hypertension Mother   . Diabetes Mother   . Heart disease Mother   . Depression Mother   . Obesity Mother   . Hypertension Father   . Diabetes Father   . Allergies Father   . Kidney disease Father   . Cancer Father   . Breast cancer Neg Hx     Social History Social History   Tobacco Use  . Smoking status: Never Smoker  . Smokeless tobacco: Never Used  Substance Use Topics  . Alcohol use: No  . Drug use: No      Review of Systems Constitutional: No fever/chills, syncope Eyes: No visual changes. ENT: No sore throat. Cardiovascular: Denies chest pain. Respiratory: Denies shortness of breath. Gastrointestinal: No abdominal pain.  No nausea, no vomiting.  No diarrhea.  No constipation. Genitourinary: Negative  for dysuria. Musculoskeletal: Negative for back pain. Skin: Negative for rash. Neurological: Negative for headaches, focal weakness or numbness. All other ROS negative ____________________________________________   PHYSICAL EXAM:  VITAL SIGNS: Blood pressure 113/73, pulse 73, temperature 97.9 F (36.6 C), temperature source Oral, resp. rate 13, height 5\' 5"  (1.651 m), weight 97.5 kg, SpO2 100 %.  Constitutional: Alert and oriented. Well appearing and in no acute distress. Eyes: Conjunctivae are normal. EOMI. Head: Atraumatic. Nose: No congestion/rhinnorhea. Mouth/Throat: Mucous membranes are moist.   Neck: No stridor. Trachea Midline. FROM Cardiovascular: Normal rate, regular rhythm. Grossly normal heart sounds.  Good peripheral circulation. Respiratory: Normal respiratory effort.  No retractions. Lungs CTAB. Gastrointestinal: Soft and nontender. No distention. No abdominal bruits.  Musculoskeletal: No lower extremity tenderness nor edema.  No joint effusions. Neurologic:  Normal speech and language. No gross focal neurologic deficits are appreciated.  Equal strength in arms and legs. Skin:  Skin is warm, dry and intact. No rash noted. Psychiatric: Mood and affect are normal. Speech and behavior are normal. GU: Deferred   ____________________________________________   LABS (all labs ordered are listed, but only abnormal results are displayed)  Labs Reviewed  COMPREHENSIVE METABOLIC PANEL - Abnormal; Notable for the following components:      Result Value   Glucose, Bld 248 (*)    BUN 21 (*)    Creatinine, Ser 1.35 (*)    AST 42 (*)    GFR calc non Af Amer 44 (*)    GFR calc Af Amer 51 (*)    All other components within normal limits  URINALYSIS, ROUTINE W REFLEX MICROSCOPIC - Abnormal; Notable for the following components:   Color, Urine YELLOW (*)    APPearance CLEAR (*)    Glucose, UA >=500 (*)    All other components within normal limits  GLUCOSE, CAPILLARY -  Abnormal; Notable for the following components:   Glucose-Capillary 221 (*)    All other components within normal limits  CBC WITH DIFFERENTIAL/PLATELET  LIPASE, BLOOD  PREGNANCY, URINE  HCG, QUANTITATIVE, PREGNANCY  BRAIN NATRIURETIC PEPTIDE  TROPONIN I (HIGH SENSITIVITY)  TROPONIN I (HIGH SENSITIVITY)   ____________________________________________   ED ECG REPORT I, , the attending physician, personally viewed and interpreted this ECG.  EKG is normal  sinus rate of 74, no ST elevation, T wave inversion lead III, normal intervals.  Reviewed prior EKG showed often times her T wave wave is flattened in that way but also sometimes inverted. ____________________________________________  RADIOLOGY I, [place], personally viewed and evaluated these images (plain radiographs) as part of my medical decision making, as well as reviewing the written report by the radiologist.  ED MD interpretation: No pneumonia noted  Official radiology report(s): DG Chest Portable 1 View  Result Date: 10/24/2019 CLINICAL DATA:  Stomach pain EXAM: PORTABLE CHEST 1 VIEW COMPARISON:  10/09/2019 FINDINGS: Lungs are clear.  No pleural effusion or pneumothorax. The heart is normal in size. IMPRESSION: No evidence of acute cardiopulmonary disease. Electronically Signed   By: Julian Hy M.D.   On: 10/24/2019 13:57    ____________________________________________   PROCEDURES  Procedure(s) performed (including Critical Care):  Procedures   ____________________________________________   INITIAL IMPRESSION / ASSESSMENT AND PLAN / ED COURSE  Amanda [name] was evaluated in Emergency Department on 10/24/2019 for the symptoms described in the history of present illness. She was evaluated in the context of the global COVID-19 pandemic, which necessitated consideration that the patient might be at risk for infection with the SARS-CoV-2 virus that causes COVID-19. Institutional protocols and  algorithms that pertain to the evaluation of patients at risk for COVID-19 are in a state of rapid change based on information released by regulatory bodies including the CDC and federal and state organizations. These policies and algorithms were followed during the patient's care in the ED.    Patient is a well-appearing 54 year old who presents with syncope most likely secondary to increase propranolol.  Patient did not hit her head and this was confirmed with her daughter.  She denies any headaches to suggest intracranial hemorrhage.  No shortness of breath to suggest PE.  Patient initially reported a little bit of abdominal tenderness to the triage note however upon my evaluation she states that she always has a little bit of abdominal tenderness and is no different than baseline and she denies it being different or new at all.  She declined CT imaging.  Will get labs to evaluate for ACS, arrhythmia, electrolyte abnormalities, AKI.  I suspect that her syncope was secondary to low blood pressures secondary to her propranolol.  We will give some fluids and continue to closely monitor patient.  Lower suspicion for seizure given no postictal.  No signs of heart failure issues so patient needs additional fluid we could give it to her.  UA no signs of UTI.  Kidney function is around her baseline.  Glucose is slightly elevated but gap is normal therefore unlikely DKA  Initial troponin is negative but will get repeat.  Reevaluated patient and her blood pressures are getting better.  Patient continues to feel well.  Repeat abdominal exam continues to be soft and nontender.  Patient handed off to oncoming team pending getting the rest of her fluids and repeat troponin and ambulation.  Anticipate patient can be discharged back home and can follow-up with her primary care doctor tomorrow.  Patient understands that she should discontinue her propranolol until she can get follow-up.        ____________________________________________   FINAL CLINICAL IMPRESSION(S) / ED DIAGNOSES   Final diagnoses:  Syncope, unspecified syncope type      MEDICATIONS GIVEN DURING THIS VISIT:  Medications  lactated ringers bolus 1,000 mL (1,000 mLs Intravenous New Bag/Given 10/24/19 1333)     ED Discharge Orders  None       Note:  This document was prepared using Dragon voice recognition software and may include unintentional dictation errors.   Concha Se, MD 10/24/19 1539

## 2019-10-24 NOTE — ED Notes (Signed)
Pt states she is feeling better, pt appears more alert, not as drowsy. Denies chest pain or SOB

## 2019-10-24 NOTE — ED Triage Notes (Signed)
Pt from home via EMS, pt passed out while sitting on couch per daughter. Pt with c/o stomach pain and also took her bp meds without checking bp. BP meds were increased last week. Pt does not remember passing out.

## 2019-11-01 ENCOUNTER — Other Ambulatory Visit: Payer: Self-pay

## 2019-11-02 ENCOUNTER — Encounter (INDEPENDENT_AMBULATORY_CARE_PROVIDER_SITE_OTHER): Payer: Self-pay | Admitting: Family Medicine

## 2019-11-02 ENCOUNTER — Ambulatory Visit: Payer: 59 | Admitting: Gastroenterology

## 2019-11-02 ENCOUNTER — Ambulatory Visit (INDEPENDENT_AMBULATORY_CARE_PROVIDER_SITE_OTHER): Payer: Managed Care, Other (non HMO) | Admitting: Family Medicine

## 2019-11-02 ENCOUNTER — Other Ambulatory Visit: Payer: Self-pay

## 2019-11-02 VITALS — BP 130/81 | HR 78 | Temp 98.5°F | Ht 65.0 in | Wt 213.0 lb

## 2019-11-02 DIAGNOSIS — Z794 Long term (current) use of insulin: Secondary | ICD-10-CM | POA: Diagnosis not present

## 2019-11-02 DIAGNOSIS — E119 Type 2 diabetes mellitus without complications: Secondary | ICD-10-CM

## 2019-11-02 DIAGNOSIS — Z6835 Body mass index (BMI) 35.0-35.9, adult: Secondary | ICD-10-CM

## 2019-11-02 DIAGNOSIS — E559 Vitamin D deficiency, unspecified: Secondary | ICD-10-CM

## 2019-11-02 DIAGNOSIS — I509 Heart failure, unspecified: Secondary | ICD-10-CM

## 2019-11-02 DIAGNOSIS — N189 Chronic kidney disease, unspecified: Secondary | ICD-10-CM

## 2019-11-02 DIAGNOSIS — G8929 Other chronic pain: Secondary | ICD-10-CM

## 2019-11-02 DIAGNOSIS — G4733 Obstructive sleep apnea (adult) (pediatric): Secondary | ICD-10-CM | POA: Diagnosis not present

## 2019-11-03 MED ORDER — VITAMIN D (ERGOCALCIFEROL) 1.25 MG (50000 UNIT) PO CAPS: 50000.0000 [IU] | ORAL_CAPSULE | ORAL | 0 refills | Status: DC

## 2019-11-03 MED ORDER — LIRAGLUTIDE 18 MG/3ML ~~LOC~~ SOPN: 3.0000 mg | PEN_INJECTOR | Freq: Every day | SUBCUTANEOUS | 0 refills | Status: DC

## 2019-11-03 NOTE — Progress Notes (Signed)
Chief Complaint:   OBESITY Amanda [name] is here to discuss her progress with her obesity treatment plan along with follow-up of her obesity related diagnoses. Amanda [name] is on the Category 2 Plan and states she is following her eating plan approximately 70% of the time. Amanda [name] states she is exercising for 0 minutes 0 times per week.  Today's visit was #: 2 Starting weight: 212 lbs Starting date: 10/19/2019 Today's weight: 213 lbs Today's date: 11/02/2019 Total lbs lost to date: 0 Total lbs lost since last in-office visit: 0  Interim History: Amanda [name] says that she likes fruit.  She did well with eggs.  Her blood glucose has ranged between 248-298.  She did a meal replacement if she did not eat.  She says she ate fish, chicken, shrimp, and sushi.  She had no sodas or juices.  She did have flavored water (2L limited due to CHF).  Subjective:   1. Type 2 diabetes mellitus without complication, with long-term current use of insulin (HCC) Medications reviewed. Amanda [name] is taking Victoza 1.8 mg daily (new dose), insulin Glargine 72 units daily, and Jardiance 25 mg daily.  Fasting blood sugars range between 200-300.    Lab Results  Component Value Date   HGBA1C 11.7 (H) 08/05/2019   HGBA1C 7.6 (H) 10/14/2017   Lab Results  Component Value Date   LDLCALC 118 (H) 10/19/2019   CREATININE 1.35 (H) 10/24/2019   2. Chronic kidney disease, unspecified CKD stage Depends on fluid status.  GFR is 50s to normal.  Amanda [name]'s fluid intake is limited.  Positive polypharmacy.  Lab Results  Component Value Date   CREATININE 1.35 (H) 10/24/2019   CREATININE 1.54 (H) 10/19/2019   CREATININE 1.01 (H) 10/19/2019   Lab Results  Component Value Date   CREATININE 1.35 (H) 10/24/2019   BUN 21 (H) 10/24/2019   NA 138 10/24/2019   K 3.8 10/24/2019   CL 102 10/24/2019   CO2 25 10/24/2019   3. OSA (obstructive sleep apnea) Amanda [name] says she lost her CPAP machine.  She sees Amanda Oaks, NP in Pulmonology.  4.  Vitamin D deficiency Amanda [name]'s Vitamin D level was 21.9 on 10/19/2019. She is not currently taking vit D. She denies nausea, vomiting or muscle weakness.  5. Other congestive heart failure (HCC) Amanda [name] is limited to 2L of water per day.  She sees Dr. [name] in Cardiology.  6. Other chronic pain Amanda [name] takes Mobic, Lidoderm, and Tizanidine.  She is alternating Lyrica and Neurontin.  She feels that Neurontin works better for her pain.  Assessment/Plan:   1. Type 2 diabetes mellitus without complication, with long-term current use of insulin (HCC) The goal will be to maximize GLP-1 and SGLT-2.  Hopefully can decrease insulin.  Will increase Victoza, as below.  Orders - liraglutide (VICTOZA) 18 MG/3ML SOPN; Inject 0.5 mLs (3 mg total) into the skin daily.  Dispense: 3 pen; Refill: 0  2. Chronic kidney disease, unspecified CKD stage Will closely monitor uncontrolled diabetes mellitus and labile blood pressure.  Will avoid nephrotoxic medications.  Will work with patient to discontinue Mobic.   Counseling . Chronic kidney disease (CKD) happens when the kidneys are damaged over a long period of time. . Most of the time, this condition does not go away, but it can usually be controlled. Steps must be taken to slow down the kidney damage or to stop it from getting worse. . Intensive lifestyle modifications are the first line treatment for this issue.  Amanda [name] Kitchen Avoid buying  foods that are: processed, frozen, or prepackaged to avoid excess salt.  3. OSA (obstructive sleep apnea) Amanda [name] needs a new CPAP.  Will need to be priority to help stabilize cardiopulmonary issues.  Will send a message.  4. Vitamin D deficiency Low Vitamin D level contributes to fatigue and are associated with obesity, breast, and colon cancer. She agrees to continue to take prescription Vitamin D @50 ,000 IU every week and will follow-up for routine testing of Vitamin D, at least 2-3 times per year to avoid  over-replacement.  Orders - Vitamin D, Ergocalciferol, (DRISDOL) 1.25 MG (50000 UNIT) CAPS capsule; Take 1 capsule (50,000 Units total) by mouth every 7 (seven) days.  Dispense: 4 capsule; Refill: 0  5. Other congestive heart failure (HCC) Clarified medications with Amanda [name].  She is taking propranolol 80 mg, diltiazem 240 mg, and Lasix 40 mg with potassium 20 MEQ.  6. Other chronic pain Will recommend stopping Mobic and Lyrica.  7. Class 2 severe obesity with serious comorbidity and body mass index (BMI) of 35.0 to 35.9 in adult, unspecified obesity type (HCC) Amanda [name] is currently in the action stage of change. As such, her goal is to continue with weight loss efforts. She has agreed to the Category 2 Plan.   Exercise goals: No exercise has been prescribed at this time.  Behavioral modification strategies: increasing lean protein intake and decreasing simple carbohydrates.  Amanda [name] has agreed to follow-up with our clinic in 2 weeks. She was informed of the importance of frequent follow-up visits to maximize her success with intensive lifestyle modifications for her multiple health conditions.   Objective:   Blood pressure 130/81, pulse 78, temperature 98.5 F (36.9 C), temperature source Oral, height 5\' 5"  (1.651 m), weight 213 lb (96.6 kg), SpO2 97 %. Body mass index is 35.45 kg/m.  General: Cooperative, alert, well developed, in no acute distress. HEENT: Conjunctivae and lids unremarkable. Cardiovascular: Regular rhythm.  Lungs: Normal work of breathing. Neurologic: No focal deficits.   Lab Results  Component Value Date   CREATININE 1.35 (H) 10/24/2019   BUN 21 (H) 10/24/2019   NA 138 10/24/2019   K 3.8 10/24/2019   CL 102 10/24/2019   CO2 25 10/24/2019   Lab Results  Component Value Date   ALT 33 10/24/2019   AST 42 (H) 10/24/2019   ALKPHOS 101 10/24/2019   BILITOT 0.6 10/24/2019   Lab Results  Component Value Date   HGBA1C 11.7 (H) 08/05/2019   HGBA1C 7.6 (H)  10/14/2017   Lab Results  Component Value Date   TSH 2.060 10/19/2019   Lab Results  Component Value Date   CHOL 245 (H) 10/19/2019   HDL 87 10/19/2019   LDLCALC 118 (H) 10/19/2019   TRIG 237 (H) 10/19/2019   CHOLHDL 2.8 10/19/2019   Lab Results  Component Value Date   WBC 5.8 10/24/2019   HGB 13.3 10/24/2019   HCT 41.6 10/24/2019   MCV 93.1 10/24/2019   PLT 300 10/24/2019   Lab Results  Component Value Date   IRON 89 10/19/2019   TIBC 459 (H) 10/19/2019   FERRITIN 61 10/19/2019   Attestation Statements:   Reviewed by clinician on day of visit: allergies, medications, problem list, medical history, surgical history, family history, social history, and previous encounter notes.  Note:  Significant time spent reviewing the patient's recent ED visits with her.  We spent time doing a thorough medication reconciliation.  I will communicate with her PCP and specialists Re: episodes of hypotension and  syncope in the setting of CHF with fluid restriction and uncontrolled diabetes mellitus.  Time spent on visit including pre-visit chart review and post-visit care and charting was 50 minutes.   I, Water quality scientist, CMA, am acting as Location manager for [employer], DO.  I have reviewed the above documentation for accuracy and completeness, and I agree with the above. Briscoe Deutscher, DO

## 2019-11-08 ENCOUNTER — Other Ambulatory Visit: Payer: Self-pay

## 2019-11-08 ENCOUNTER — Encounter: Payer: Self-pay | Admitting: *Deleted

## 2019-11-08 ENCOUNTER — Emergency Department
Admission: EM | Admit: 2019-11-08 | Discharge: 2019-11-09 | Disposition: A | Discharge status: Home / Self Care | Payer: Medicare HMO | Attending: Emergency Medicine | Admitting: Emergency Medicine

## 2019-11-08 DIAGNOSIS — E119 Type 2 diabetes mellitus without complications: Secondary | ICD-10-CM | POA: Insufficient documentation

## 2019-11-08 DIAGNOSIS — G40909 Epilepsy, unspecified, not intractable, without status epilepticus: Secondary | ICD-10-CM | POA: Diagnosis not present

## 2019-11-08 DIAGNOSIS — I110 Hypertensive heart disease with heart failure: Secondary | ICD-10-CM | POA: Insufficient documentation

## 2019-11-08 DIAGNOSIS — J45909 Unspecified asthma, uncomplicated: Secondary | ICD-10-CM | POA: Diagnosis not present

## 2019-11-08 DIAGNOSIS — I10 Essential (primary) hypertension: Secondary | ICD-10-CM | POA: Diagnosis not present

## 2019-11-08 DIAGNOSIS — I5032 Chronic diastolic (congestive) heart failure: Secondary | ICD-10-CM | POA: Diagnosis not present

## 2019-11-08 DIAGNOSIS — F1210 Cannabis abuse, uncomplicated: Secondary | ICD-10-CM | POA: Insufficient documentation

## 2019-11-08 DIAGNOSIS — R4020 Unspecified coma: Secondary | ICD-10-CM | POA: Diagnosis not present

## 2019-11-08 DIAGNOSIS — R000 Tachycardia, unspecified: Secondary | ICD-10-CM | POA: Diagnosis not present

## 2019-11-08 DIAGNOSIS — Z79899 Other long term (current) drug therapy: Secondary | ICD-10-CM | POA: Diagnosis not present

## 2019-11-08 DIAGNOSIS — Z8673 Personal history of transient ischemic attack (TIA), and cerebral infarction without residual deficits: Secondary | ICD-10-CM | POA: Diagnosis not present

## 2019-11-08 DIAGNOSIS — I959 Hypotension, unspecified: Secondary | ICD-10-CM | POA: Diagnosis not present

## 2019-11-08 DIAGNOSIS — Z794 Long term (current) use of insulin: Secondary | ICD-10-CM | POA: Insufficient documentation

## 2019-11-08 DIAGNOSIS — R0902 Hypoxemia: Secondary | ICD-10-CM | POA: Diagnosis not present

## 2019-11-08 DIAGNOSIS — R569 Unspecified convulsions: Secondary | ICD-10-CM | POA: Diagnosis not present

## 2019-11-08 DIAGNOSIS — R69 Illness, unspecified: Secondary | ICD-10-CM | POA: Diagnosis not present

## 2019-11-08 DIAGNOSIS — Z96651 Presence of right artificial knee joint: Secondary | ICD-10-CM | POA: Insufficient documentation

## 2019-11-08 LAB — CBC
HCT: 39.7 % (ref 36.0–46.0)
Hemoglobin: 12.9 g/dL (ref 12.0–15.0)
MCH: 30.1 pg (ref 26.0–34.0)
MCHC: 32.5 g/dL (ref 30.0–36.0)
MCV: 92.5 fL (ref 80.0–100.0)
Platelets: 318 10*3/uL (ref 150–400)
RBC: 4.29 MIL/uL (ref 3.87–5.11)
RDW: 14 % (ref 11.5–15.5)
WBC: 6.2 10*3/uL (ref 4.0–10.5)
nRBC: 0 % (ref 0.0–0.2)

## 2019-11-08 LAB — COMPREHENSIVE METABOLIC PANEL
ALT: 36 U/L (ref 0–44)
AST: 32 U/L (ref 15–41)
Albumin: 4.2 g/dL (ref 3.5–5.0)
Alkaline Phosphatase: 106 U/L (ref 38–126)
Anion gap: 6 (ref 5–15)
BUN: 19 mg/dL (ref 6–20)
CO2: 28 mmol/L (ref 22–32)
Calcium: 10.1 mg/dL (ref 8.9–10.3)
Chloride: 104 mmol/L (ref 98–111)
Creatinine, Ser: 0.93 mg/dL (ref 0.44–1.00)
GFR calc Af Amer: >60 mL/min (ref >60)
GFR calc non Af Amer: >60 mL/min (ref >60)
Glucose, Bld: 175 mg/dL — ABNORMAL HIGH (ref 70–99)
Potassium: 4.4 mmol/L (ref 3.5–5.1)
Sodium: 138 mmol/L (ref 135–145)
Total Bilirubin: 0.4 mg/dL (ref 0.3–1.2)
Total Protein: 7.9 g/dL (ref 6.5–8.1)

## 2019-11-08 LAB — GLUCOSE, CAPILLARY: Glucose-Capillary: 103 mg/dL — ABNORMAL HIGH (ref 70–99)

## 2019-11-08 MED ORDER — TOPIRAMATE 25 MG PO TABS
100.0000 mg | ORAL_TABLET | Freq: Once | ORAL | Status: AC
  Administered 2019-11-08: 100 mg via ORAL
  Filled 2019-11-08: qty 4

## 2019-11-08 NOTE — ED Triage Notes (Signed)
Pt brought in via ems from home.  Pt had a seizure today.  No headache.  Pt has left knee pain.   Pt reports taking seizure meds.  Pt sleepy in triage.

## 2019-11-09 DIAGNOSIS — R569 Unspecified convulsions: Secondary | ICD-10-CM | POA: Diagnosis not present

## 2019-11-09 LAB — URINALYSIS, COMPLETE (UACMP) WITH MICROSCOPIC
Bacteria, UA: NONE SEEN
Bilirubin Urine: NEGATIVE
Glucose, UA: 500 mg/dL — AB
Hgb urine dipstick: NEGATIVE
Ketones, ur: NEGATIVE mg/dL
Leukocytes,Ua: NEGATIVE
Nitrite: NEGATIVE
Protein, ur: NEGATIVE mg/dL
RBC / HPF: NONE SEEN RBC/hpf (ref 0–5)
Specific Gravity, Urine: 1.01 (ref 1.005–1.030)
pH: 6 (ref 5.0–8.0)

## 2019-11-09 LAB — URINE DRUG SCREEN, QUALITATIVE (ARMC ONLY)
Amphetamines, Ur Screen: NOT DETECTED
Barbiturates, Ur Screen: NOT DETECTED
Benzodiazepine, Ur Scrn: NOT DETECTED
Cannabinoid 50 Ng, Ur ~~LOC~~: POSITIVE — AB
Cocaine Metabolite,Ur ~~LOC~~: NOT DETECTED
MDMA (Ecstasy)Ur Screen: NOT DETECTED
Methadone Scn, Ur: NOT DETECTED
Opiate, Ur Screen: NOT DETECTED
Phencyclidine (PCP) Ur S: NOT DETECTED
Tricyclic, Ur Screen: NOT DETECTED

## 2019-11-09 NOTE — Discharge Instructions (Addendum)
Increase your topamax to 100mg  twice a day.  Seizures may happen at any time. It is important to take certain precautions to maintain your safety.   Follow up with your doctor in 1-3 days.  During a seizure, a person may injure himself or herself. Seizure precautions are guidelines that a person can follow in order to minimize injury during a seizure. For any activity, it is important to ask, "What would happen if I had a seizure while doing this?" Follow the below precautions.  Bathroom Safety  A person with seizures may want to shower instead of bathe to avoid accidental drowning. If falls occur during the patient's typical seizure, a person should use a shower seat, preferably one with a safety strap.  Use nonskid strips in your shower or tub.  Never use electrical equipment near water. This prevents accidental electrocution.  Consider changing glass in shower doors to shatterproof glass.  If possible, cook when someone else is nearby.  Use the back burners of the stove to prevent accidental burns.  Use shatterproof containers as much as possible. For instance, sauces can be transferred from glass bottles to plastic containers for use.  Limit time that is required using knives or other sharp objects. If possible, buy foods that are already cut, or ask someone to help in meal preparation.   General Safety at Home Do not smoke or light fires in the fireplace unless someone else is present.  Do not use space heaters that can be accidentally overturned.  When alone, avoid using step stools or ladders, and do not clean rooftop gutters.  Purchase power tools and motorized Secondary [employer] teacher which have a safety switch that will stop the machine if you release the handle (a 'dead man's' switch).   Driving and Transportation DO NOT DRIVE UNTIL YOU ARE CLEARED BY A NEUROLOGIST and/or you have permission to drive from your state's Department of Motor Vehicles  [facility]). Each state has  different laws. Please refer to the following link on the Epile[employer] Foundation of America's website for more information: http://www.epilepsyfoundation.org/answerplace/Social/driving/drivingu.cfm  If you ride a bicycle, wear a helmet and any other necessary protective gear.  When taking public transportation like the bus or subway, stay clear of the platform edge.   Outdoor [facility] is okay, but does present certain risks. Never swim alone, and tell friends what to do if you have a seizure while swimming.  Wear appropriate protective equipment.  Ski with a friend. If a seizure occurs, your friend can seek help, if needed. He or she can also help to get you out of the cold. Consider using a safety hook or belt while riding the ski lift.

## 2019-11-09 NOTE — ED Provider Notes (Signed)
[facility] Emergency Department Provider Note  ____________________________________________  Time seen: Approximately 12:16 AM  I have reviewed the triage vital signs and the nursing notes.   HISTORY  Chief Complaint Seizures   HPI Amanda [name] is a 54 y.o. female with a history of seizures disorder on Topamax, CHF, depression, diabetes, hypertension, hypothyroidism, IBS who presents for evaluation of seizure.  Patient reports that she has been having 2-3 seizures a month since January.  She has not seen her doctor for those yet.  She has been on Topamax 50 mg twice daily for several years.  She does endorse a 15 pound weight gain over the last several months.  She denies forgetting to take any doses.  She reports that she last thing she remembers was being in the living room and then waking up in the hospital.  She denies tongue trauma, urinary or bowel incontinence.  The episode was witnessed by a family member who describes patient having a generalized tonic-clonic seizure.  Patient denies being sick recently, she denies a headache, sore throat, fever, cough, chest pain, shortness of breath, abdominal pain, vomiting, diarrhea, dysuria or hematuria.  She denies drugs or alcohol use.  She reports that prior to January her seizures were very well controlled and she had not had one in several years.   Past Medical History:  Diagnosis Date  . ADD (attention deficit disorder)   . Anxiety   . Arthritis    knees, shoulder, upper back  . Asthma   . CFS (chronic fatigue syndrome)   . Chewing difficulty   . CHF (congestive heart failure) (HCC)   . Constipation   . Depression   . Diabetes mellitus without complication (HCC)   . Dyspnea   . Environmental allergies   . Fatty liver   . Fibromyalgia   . GERD (gastroesophageal reflux disease)   . GI bleed 10/02/2018  . Headache    migraines - 5x/mo  . Hypertension   . Hypokalemia 10/14/2017  . Hypothyroidism     . IBS (irritable bowel syndrome)   . Joint pain   . Lower extremity edema   . Migraines   . Motion sickness    ships  . Osteoarthritis   . Other specified disorders of thyroid   . Sleep apnea   . Stroke [facility])    no residual deficits  . Swallowing difficulty   . Thyroid nodule    bilateral and goiter  . Vitamin D deficiency   . Wears contact lenses   . Wears dentures    partial upper    Patient Active Problem List   Diagnosis Date Noted  . Syncope 08/27/2019  . Syncope and collapse 08/26/2019  . Asthma 07/13/2019  . Hypotension 10/10/2018  . Acute on chronic diastolic (congestive) heart failure (HCC) 07/17/2018  . Acute on chronic heart failure (HCC) 07/08/2018  . Chronic diastolic heart failure (HCC) 04/17/2018  . Lymphedema 04/17/2018  . Chest pain 03/28/2018  . Hypertension 10/15/2017  . Wears dentures 10/15/2017  . Symptomatic cholelithiasis 04/02/2017  . Recurrent incisional hernia 03/17/2017  . Calculus of gallbladder without cholecystitis without obstruction 03/17/2017  . Helicobacter pylori gastritis 03/17/2017  . Depression 09/28/2016  . Diabetes mellitus, type 2 (HCC) 09/28/2016  . Tremor 07/01/2013  . Esophageal reflux 06/22/2012  . Major depressive disorder, single episode 08/22/2009  . Sleep apnea 05/30/2009  . Hyperlipidemia 08/10/2008    Past Surgical History:  Procedure Laterality Date  . ABDOMINAL HYSTERECTOMY  2000's  . ABDOMINAL SURGERY     laparoscopy x4 with lysis of adhesions  . APPENDECTOMY  1986  . CESAREAN SECTION  1992  . CHOLECYSTECTOMY N/A 04/03/2017   Procedure: LAPAROSCOPIC CHOLECYSTECTOMY;  Surgeon: Henrene Dodge, MD;  Location: [facility] ORS;  Service: General;  Laterality: N/A;  . COLONOSCOPY N/A 10/06/2018   Procedure: COLONOSCOPY;  Surgeon: Toney Reil, MD;  Location: [facility] ENDOSCOPY;  Service: Gastroenterology;  Laterality: N/A;  . COLONOSCOPY WITH PROPOFOL N/A 03/10/2017   Procedure: COLONOSCOPY WITH PROPOFOL;  Surgeon:  Scot Jun, MD;  Location: [facility] ENDOSCOPY;  Service: Endoscopy;  Laterality: N/A;  . ECTOPIC PREGNANCY SURGERY  2000's  . ESOPHAGOGASTRODUODENOSCOPY (EGD) WITH PROPOFOL N/A 03/10/2017   Procedure: ESOPHAGOGASTRODUODENOSCOPY (EGD) WITH PROPOFOL;  Surgeon: Scot Jun, MD;  Location: [facility] ENDOSCOPY;  Service: Endoscopy;  Laterality: N/A;  . HERNIA REPAIR    . JOINT REPLACEMENT Right 12/16/12   knee- medial - makoplasty  . KNEE ARTHROSCOPY Right 2006   partial medial and lateral meniscectomies  . KNEE ARTHROSCOPY Right 10/06/2015   Procedure: RIGHT KNEE ARTHROSCOPY WITH DEBRIDEMENT;  Surgeon: Erin Sons, MD;  Location: [facility];  Service: Orthopedics;  Laterality: Right;  Diabetic - insulin and oral meds  . ROTATOR CUFF REPAIR Left 2006  . TUBAL LIGATION  2000's    Prior to Admission medications   Medication Sig Start Date End Date Taking? Authorizing Provider  albuterol (PROVENTIL HFA) 108 (90 Base) MCG/ACT inhaler Inhale 2 puffs into the lungs every 4 (four) hours as needed for wheezing or shortness of breath. 07/13/19   Parrett, Virgel Bouquet, NP  Ascorbic Acid (VITAMIN C CR) 1000 MG TBCR Take by mouth.    [provider]  diltiazem (TIAZAC) 240 MG 24 hr capsule Take by mouth. 04/16/16   [provider]  estradiol (ESTRACE) 0.1 MG/GM vaginal cream  10/15/19   [provider]  fluticasone furoate-vilanterol (BREO ELLIPTA) 200-25 MCG/INH AEPB Inhale 1 puff into the lungs daily. 1 puff daily. 07/14/19   Parrett, Virgel Bouquet, NP  furosemide (LASIX) 40 MG tablet Take 1 tablet (40 mg total) by mouth daily. 08/27/19   Lurene Shadow, MD  gabapentin (NEURONTIN) 300 MG capsule Take 300 mg by mouth 2 (two) times daily.  03/22/18   [provider]  Insulin Glargine, 2 Unit Dial, (TOUJEO MAX SOLOSTAR) 300 UNIT/ML SOPN Inject 72 Units into the skin daily.     [provider]  ipratropium-albuterol (DUONEB) 0.5-2.5 (3) MG/3ML SOLN Inhale 3 mLs into  the lungs 3 (three) times daily as needed (shortness of breath, wheezing or cough). 07/13/19   Parrett, Virgel Bouquet, NP  JARDIANCE 25 MG TABS tablet  10/15/19   [provider]  lidocaine (LIDODERM) 5 % 1 patch daily. 08/16/19   [provider]  LINZESS 290 MCG CAPS capsule Take 1 tablet by mouth daily before breakfast.  02/28/17   [provider]  liraglutide (VICTOZA) 18 MG/3ML SOPN Inject 0.5 mLs (3 mg total) into the skin daily. 11/03/19   Helane Rima, DO  meloxicam (MOBIC) 15 MG tablet  10/15/19   [provider]  mirtazapine (REMERON SOL-TAB) 30 MG disintegrating tablet Take 30 mg by mouth at bedtime as needed (Nap).  03/18/18   [provider]  mometasone (ELOCON) 0.1 % ointment Apply 1 application topically 2 (two) times daily. 10/15/19   [provider]  mupirocin ointment (BACTROBAN) 2 %  10/15/19   [provider]  potassium chloride SA (KLOR-CON) 20  MEQ tablet Take by mouth. 03/11/18   [provider]  pravastatin (PRAVACHOL) 80 MG tablet Take 1 tablet (80 mg total) by mouth daily. 10/17/17 09/02/19  Murlean Iba, MD  pregabalin (LYRICA) 150 MG capsule  10/15/19   [provider]  propranolol (INDERAL) 80 MG tablet Take 80 mg by mouth daily.     [provider]  tiotropium (SPIRIVA HANDIHALER) 18 MCG inhalation capsule Place 1 capsule (18 mcg total) into inhaler and inhale daily. 07/13/19 10/19/19  Parrett, Fonnie Mu, NP  tiZANidine (ZANAFLEX) 4 MG tablet Take 4 mg by mouth every 8 (eight) hours.     [provider]  Vitamin D, Ergocalciferol, (DRISDOL) 1.25 MG (50000 UNIT) CAPS capsule Take 1 capsule (50,000 Units total) by mouth every 7 (seven) days. 11/03/19   Briscoe Deutscher, DO    Allergies Penicillins and Ace inhibitors  Family History  Problem Relation Age of Onset  . Hypertension Mother   . Diabetes Mother   . Heart disease Mother   . Depression Mother   . Obesity Mother   .  Hypertension Father   . Diabetes Father   . Allergies Father   . Kidney disease Father   . Cancer Father   . Breast cancer Neg Hx     Social History Social History   Tobacco Use  . Smoking status: Never Smoker  . Smokeless tobacco: Never Used  Substance Use Topics  . Alcohol use: No  . Drug use: No    Review of Systems  Constitutional: Negative for fever. Eyes: Negative for visual changes. ENT: Negative for sore throat. Neck: No neck pain  Cardiovascular: Negative for chest pain. Respiratory: Negative for shortness of breath. Gastrointestinal: Negative for abdominal pain, vomiting or diarrhea. Genitourinary: Negative for dysuria. Musculoskeletal: Negative for back pain. Skin: Negative for rash. Neurological: Negative for headaches, weakness or numbness. + seizure Psych: No SI or HI  ____________________________________________   PHYSICAL EXAM:  VITAL SIGNS: Vitals:   11/08/19 2300 11/09/19 0030  BP: (!) 151/96 (!) 148/88  Pulse: 73 67  Resp: 16   Temp: 97.9 F (36.6 C)   SpO2: 95% 98%    Constitutional: Alert and oriented. Well appearing and in no apparent distress. HEENT:      Head: Normocephalic and atraumatic.         Eyes: Conjunctivae are normal. Sclera is non-icteric.       Mouth/Throat: Mucous membranes are moist.       Neck: Supple with no signs of meningismus. Cardiovascular: Regular rate and rhythm. No murmurs, gallops, or rubs. 2+ symmetrical distal pulses are present in all extremities.  Respiratory: Normal respiratory effort. Lungs are clear to auscultation bilaterally. No wheezes, crackles, or rhonchi.  Gastrointestinal: Soft, non tender, and non distended  Musculoskeletal:  No edema, cyanosis, or erythema of extremities. Neurologic: Normal speech and language. Face is symmetric. EOMI, PERRL.  Intact strength and sensation x4, no pronator drift, no dysmetria  Skin: Skin is warm, dry and intact. No rash noted. Psychiatric: Mood and affect are  normal. Speech and behavior are normal.  ____________________________________________   LABS (all labs ordered are listed, but only abnormal results are displayed)  Labs Reviewed  COMPREHENSIVE METABOLIC PANEL - Abnormal; Notable for the following components:      Result Value   Glucose, Bld 175 (*)    All other components within normal limits  GLUCOSE, CAPILLARY - Abnormal; Notable for the following components:   Glucose-Capillary 103 (*)    All  other components within normal limits  URINALYSIS, COMPLETE (UACMP) WITH MICROSCOPIC - Abnormal; Notable for the following components:   Glucose, UA 500 (*)    All other components within normal limits  URINE DRUG SCREEN, QUALITATIVE ([facility] ONLY) - Abnormal; Notable for the following components:   Cannabinoid 50 Ng, Ur [place] POSITIVE (*)    All other components within normal limits  CBC  TOPIRAMATE LEVEL   ____________________________________________  EKG  ED ECG REPORT I, Nita Sickle, the attending physician, personally viewed and interpreted this ECG.  Normal sinus rhythm, rate of 71, normal intervals, normal axis, no ST elevations or depressions, low voltage QRS.  Unchanged from prior. ____________________________________________  RADIOLOGY  none  ____________________________________________   PROCEDURES  Procedure(s) performed: None Procedures Critical Care performed:  None ____________________________________________   INITIAL IMPRESSION / ASSESSMENT AND PLAN / ED COURSE  54 y.o. female with a history of seizures disorder on Topamax, CHF, depression, diabetes, hypertension, hypothyroidism, IBS who presents for evaluation of seizure.  Patient seizures seem to be not well controlled for the last couple of months.  She denies missing any medications but does endorse a 15 pound weight gain.  She may need a higher dose of Topamax.  She has not seen her doctor yet for these.  She does not have a neurologist.  She is  otherwise extremely well-appearing and in no distress with normal vital signs, neurologically intact, no meningeal signs, no fever.  As part of my medical decision making, I reviewed the following data within the electronic MEDICAL RECORD NUMBERAt this time I do not believe patient needs any further imaging. I have  reviewed the EKG and looked at the rhythm strip in the room which showed NSR with no evidence of ischemia or dysrhythmias.  She was monitored on telemetry with no evidence of dysrhythmias.. I have reviewed patient's previous medical records and PMH. Labs were reviewed by me and showed no electrolyte derangements, no signs of sepsis or infection, UA negative, UDS + for cannabinoids.  I will increase her Topamax to 100 mg twice daily and recommended close follow-up with her primary care doctor for further monitoring.  I discussed the seizure precautions and my standard return precautions.  Patient was monitored for almost 9 hours post arrival with no further episodes of seizure.  She remains at baseline.      _____________________________________________ Please note:  Patient was evaluated in Emergency Department today for the symptoms described in the history of present illness. Patient was evaluated in the context of the global COVID-19 pandemic, which necessitated consideration that the patient might be at risk for infection with the SARS-CoV-2 virus that causes COVID-19. Institutional protocols and algorithms that pertain to the evaluation of patients at risk for COVID-19 are in a state of rapid change based on information released by regulatory bodies including the CDC and federal and state organizations. These policies and algorithms were followed during the patient's care in the ED.  Some ED evaluations and interventions may be delayed as a result of limited staffing during the pandemic.   ____________________________________________   FINAL CLINICAL IMPRESSION(S) / ED DIAGNOSES   Final  diagnoses:  Seizure (HCC)      NEW MEDICATIONS STARTED DURING THIS VISIT:  ED Discharge Orders    None       Note:  This document was prepared using Dragon voice recognition software and may include unintentional dictation errors.    Don Perking, [place], MD 11/09/19 [phone]

## 2019-11-10 LAB — TOPIRAMATE LEVEL: Topiramate Lvl: 1.6 ug/mL — ABNORMAL LOW (ref 2.0–25.0)

## 2019-11-11 ENCOUNTER — Telehealth: Payer: Self-pay | Admitting: Emergency Medicine

## 2019-11-11 NOTE — Telephone Encounter (Signed)
Called patient to inform that topiramate level is back so she can pass on to her doctor.  Left message with my number and telling her that her level was back and she can use her my chart and call her doctor.

## 2019-11-17 ENCOUNTER — Other Ambulatory Visit: Payer: Self-pay

## 2019-11-17 ENCOUNTER — Encounter (INDEPENDENT_AMBULATORY_CARE_PROVIDER_SITE_OTHER): Payer: Self-pay | Admitting: Family Medicine

## 2019-11-17 ENCOUNTER — Ambulatory Visit (INDEPENDENT_AMBULATORY_CARE_PROVIDER_SITE_OTHER): Payer: Medicare HMO | Admitting: Family Medicine

## 2019-11-17 VITALS — BP 130/84 | HR 85 | Temp 98.4°F | Ht 65.0 in | Wt 217.0 lb

## 2019-11-17 DIAGNOSIS — E559 Vitamin D deficiency, unspecified: Secondary | ICD-10-CM

## 2019-11-17 DIAGNOSIS — G40909 Epilepsy, unspecified, not intractable, without status epilepticus: Secondary | ICD-10-CM | POA: Diagnosis not present

## 2019-11-17 DIAGNOSIS — Z794 Long term (current) use of insulin: Secondary | ICD-10-CM | POA: Diagnosis not present

## 2019-11-17 DIAGNOSIS — E119 Type 2 diabetes mellitus without complications: Secondary | ICD-10-CM | POA: Diagnosis not present

## 2019-11-17 DIAGNOSIS — G4733 Obstructive sleep apnea (adult) (pediatric): Secondary | ICD-10-CM

## 2019-11-17 DIAGNOSIS — Z6836 Body mass index (BMI) 36.0-36.9, adult: Secondary | ICD-10-CM | POA: Diagnosis not present

## 2019-11-17 NOTE — Progress Notes (Signed)
Chief Complaint:   OBESITY Amanda [name] is here to discuss her progress with her obesity treatment plan along with follow-up of her obesity related diagnoses. Amanda [name] is on the Category 2 Plan and states she is following her eating plan approximately 50% of the time. Amanda [name] states she is exercising for 0 minutes 0 times per week.  Today's visit was #: 3 Starting weight: 212 lbs Starting date: 10/19/2019 Today's weight: 217 lbs Today's date: 11/17/2019 Total lbs lost to date: 0 Total lbs lost since last in-office visit: 0  Interim History: Amanda [name] is out of her medication due to pharmacy issues at [facility].  She reports having a seizure last week.  Subjective:   1. Type 2 diabetes mellitus without complication, with long-term current use of insulin (HCC) Medications reviewed.  Amanda [name] was taking Victoza 3 mg daily, but she states she is out of it at this time.  She is also on insulin and Jardiance.  Lab Results  Component Value Date   HGBA1C 11.7 (H) 08/05/2019   HGBA1C 7.6 (H) 10/14/2017   Lab Results  Component Value Date   LDLCALC 118 (H) 10/19/2019   CREATININE 0.93 11/08/2019   2. Seizure disorder ([place]) Amanda [name] states she was taking Topamax 100 mg daily.  She is out of this medication.  3. OSA (obstructive sleep apnea) Amanda [name] has a diagnosis of sleep apnea. She reports that she is not using a CPAP regularly.   4. Vitamin D deficiency Amanda [name]'s Vitamin D level was 21.9 on 10/19/2019. She is currently taking vit D. She denies nausea, vomiting or muscle weakness.  Assessment/Plan:   1. Type 2 diabetes mellitus without complication, with long-term current use of insulin (HCC) Good blood sugar control is important to decrease the likelihood of diabetic complications such as nephropathy, neuropathy, limb loss, blindness, coronary artery disease, and death. Intensive lifestyle modification including diet, exercise and weight loss are the first line of treatment for diabetes.    2. Seizure disorder [facility]) Will place urgent referral to Neurology, as below.  Orders - Ambulatory referral to Neurology  3. OSA (obstructive sleep apnea) Intensive lifestyle modifications are the first line treatment for this issue. We discussed several lifestyle modifications today and she will continue to work on diet, exercise and weight loss efforts. We will continue to monitor. Orders and follow up as documented in patient record.   Counseling  Sleep apnea is a condition in which breathing pauses or becomes shallow during sleep. This happens over and over during the night. This disrupts your sleep and keeps your body from getting the rest that it needs, which can cause tiredness and lack of energy (fatigue) during the day.  Sleep apnea treatment: If you were given a device to open your airway while you sleep, USE IT!  Sleep hygiene:   Limit or avoid alcohol, caffeinated beverages, and cigarettes, especially close to bedtime.   Do not eat a large meal or eat spicy foods right before bedtime. This can lead to digestive discomfort that can make it hard for you to sleep.  Keep a sleep diary to help you and your health care provider figure out what could be causing your insomnia.  . Make your bedroom a dark, comfortable place where it is easy to fall asleep. ? Put up shades or blackout curtains to block light from outside. ? Use a white noise machine to block noise. ? Keep the temperature cool. . Limit screen use before bedtime. This includes: ? Watching TV. ?  Using your smartphone, tablet, or computer. . Stick to a routine that includes going to bed and waking up at the same times every day and night. This can help you fall asleep faster. Consider making a quiet activity, such as reading, part of your nighttime routine. . Try to avoid taking naps during the day so that you sleep better at night. . Get out of bed if you are still awake after 15 minutes of trying to sleep. Keep the  lights down, but try reading or doing a quiet activity. When you feel sleepy, go back to bed.  4. Vitamin D deficiency Low Vitamin D level contributes to fatigue and are associated with obesity, breast, and colon cancer. She agrees to continue to take prescription Vitamin D @50 ,000 IU every week and will follow-up for routine testing of Vitamin D, at least 2-3 times per year to avoid over-replacement.  5. Class 2 severe obesity with serious comorbidity and body mass index (BMI) of 36.0 to 36.9 in adult, unspecified obesity type (HCC) Amanda [name] is currently in the action stage of change. As such, her goal is to continue with weight loss efforts. She has agreed to the Category 2 Plan.   Exercise goals: No exercise has been prescribed at this time.  Behavioral modification strategies: increasing water intake.  Amanda [name] has agreed to follow-up with our clinic in 2 weeks. She was informed of the importance of frequent follow-up visits to maximize her success with intensive lifestyle modifications for her multiple health conditions.   Objective:   Blood pressure 130/84, pulse 85, temperature 98.4 F (36.9 C), temperature source Oral, height 5\' 5"  (1.651 m), weight 217 lb (98.4 kg), SpO2 100 %. Body mass index is 36.11 kg/m.  General: Cooperative, alert, well developed, in no acute distress. HEENT: Conjunctivae and lids unremarkable. Cardiovascular: Regular rhythm.  Lungs: Normal work of breathing. Neurologic: No focal deficits.   Lab Results  Component Value Date   CREATININE 0.93 11/08/2019   BUN 19 11/08/2019   NA 138 11/08/2019   K 4.4 11/08/2019   CL 104 11/08/2019   CO2 28 11/08/2019   Lab Results  Component Value Date   ALT 36 11/08/2019   AST 32 11/08/2019   ALKPHOS 106 11/08/2019   BILITOT 0.4 11/08/2019   Lab Results  Component Value Date   HGBA1C 11.7 (H) 08/05/2019   HGBA1C 7.6 (H) 10/14/2017   Lab Results  Component Value Date   TSH 2.060 10/19/2019   Lab Results    Component Value Date   CHOL 245 (H) 10/19/2019   HDL 87 10/19/2019   LDLCALC 118 (H) 10/19/2019   TRIG 237 (H) 10/19/2019   CHOLHDL 2.8 10/19/2019   Lab Results  Component Value Date   WBC 6.2 11/08/2019   HGB 12.9 11/08/2019   HCT 39.7 11/08/2019   MCV 92.5 11/08/2019   PLT 318 11/08/2019   Lab Results  Component Value Date   IRON 89 10/19/2019   TIBC 459 (H) 10/19/2019   FERRITIN 61 10/19/2019   Attestation Statements:   Reviewed by clinician on day of visit: allergies, medications, problem list, medical history, surgical history, family history, social history, and previous encounter notes.  I, 10/21/2019, CMA, am acting as 10/21/2019 for Insurance claims handler, DO.  I have reviewed the above documentation for accuracy and completeness, and I agree with the above. Energy manager, DO

## 2019-11-30 ENCOUNTER — Ambulatory Visit: Payer: Medicare HMO | Admitting: Gastroenterology

## 2019-12-02 ENCOUNTER — Ambulatory Visit (INDEPENDENT_AMBULATORY_CARE_PROVIDER_SITE_OTHER): Payer: Medicare HMO | Admitting: Family Medicine

## 2019-12-07 DIAGNOSIS — R569 Unspecified convulsions: Secondary | ICD-10-CM | POA: Diagnosis not present

## 2019-12-07 DIAGNOSIS — E1165 Type 2 diabetes mellitus with hyperglycemia: Secondary | ICD-10-CM | POA: Diagnosis not present

## 2019-12-08 ENCOUNTER — Ambulatory Visit (INDEPENDENT_AMBULATORY_CARE_PROVIDER_SITE_OTHER): Payer: Medicare HMO | Admitting: Family Medicine

## 2019-12-08 ENCOUNTER — Encounter (INDEPENDENT_AMBULATORY_CARE_PROVIDER_SITE_OTHER): Payer: Self-pay

## 2019-12-13 ENCOUNTER — Ambulatory Visit (INDEPENDENT_AMBULATORY_CARE_PROVIDER_SITE_OTHER): Payer: Medicare HMO | Admitting: Family Medicine

## 2019-12-16 ENCOUNTER — Emergency Department
Admission: EM | Admit: 2019-12-16 | Discharge: 2019-12-16 | Disposition: A | Discharge status: Home / Self Care | Payer: Medicare HMO | Attending: Emergency Medicine | Admitting: Emergency Medicine

## 2019-12-16 ENCOUNTER — Other Ambulatory Visit: Payer: Self-pay

## 2019-12-16 DIAGNOSIS — R5383 Other fatigue: Secondary | ICD-10-CM | POA: Diagnosis not present

## 2019-12-16 DIAGNOSIS — R4182 Altered mental status, unspecified: Secondary | ICD-10-CM | POA: Insufficient documentation

## 2019-12-16 DIAGNOSIS — Z5321 Procedure and treatment not carried out due to patient leaving prior to being seen by health care provider: Secondary | ICD-10-CM | POA: Diagnosis not present

## 2019-12-16 DIAGNOSIS — Z743 Need for continuous supervision: Secondary | ICD-10-CM | POA: Diagnosis not present

## 2019-12-16 DIAGNOSIS — R4020 Unspecified coma: Secondary | ICD-10-CM | POA: Diagnosis not present

## 2019-12-16 DIAGNOSIS — R410 Disorientation, unspecified: Secondary | ICD-10-CM | POA: Diagnosis not present

## 2019-12-16 DIAGNOSIS — M7918 Myalgia, other site: Secondary | ICD-10-CM | POA: Diagnosis not present

## 2019-12-16 DIAGNOSIS — E1165 Type 2 diabetes mellitus with hyperglycemia: Secondary | ICD-10-CM | POA: Diagnosis not present

## 2019-12-16 DIAGNOSIS — I10 Essential (primary) hypertension: Secondary | ICD-10-CM | POA: Diagnosis not present

## 2019-12-16 LAB — POCT PREGNANCY, URINE: Preg Test, Ur: NEGATIVE

## 2019-12-16 LAB — COMPREHENSIVE METABOLIC PANEL
ALT: 58 U/L — ABNORMAL HIGH (ref 0–44)
AST: 31 U/L (ref 15–41)
Albumin: 4.3 g/dL (ref 3.5–5.0)
Alkaline Phosphatase: 114 U/L (ref 38–126)
Anion gap: 10 (ref 5–15)
BUN: 24 mg/dL — ABNORMAL HIGH (ref 6–20)
CO2: 26 mmol/L (ref 22–32)
Calcium: 10.2 mg/dL (ref 8.9–10.3)
Chloride: 102 mmol/L (ref 98–111)
Creatinine, Ser: 0.78 mg/dL (ref 0.44–1.00)
GFR calc Af Amer: >60 mL/min (ref >60)
GFR calc non Af Amer: >60 mL/min (ref >60)
Glucose, Bld: 250 mg/dL — ABNORMAL HIGH (ref 70–99)
Potassium: 4.2 mmol/L (ref 3.5–5.1)
Sodium: 138 mmol/L (ref 135–145)
Total Bilirubin: 0.6 mg/dL (ref 0.3–1.2)
Total Protein: 7.9 g/dL (ref 6.5–8.1)

## 2019-12-16 LAB — CBC
HCT: 42.6 % (ref 36.0–46.0)
Hemoglobin: 13.9 g/dL (ref 12.0–15.0)
MCH: 29 pg (ref 26.0–34.0)
MCHC: 32.6 g/dL (ref 30.0–36.0)
MCV: 88.9 fL (ref 80.0–100.0)
Platelets: 324 10*3/uL (ref 150–400)
RBC: 4.79 MIL/uL (ref 3.87–5.11)
RDW: 14.1 % (ref 11.5–15.5)
WBC: 6.1 10*3/uL (ref 4.0–10.5)
nRBC: 0 % (ref 0.0–0.2)

## 2019-12-16 MED ORDER — SODIUM CHLORIDE 0.9% FLUSH: 3.0000 mL | Freq: Once | INTRAVENOUS | Status: DC

## 2019-12-16 NOTE — ED Triage Notes (Addendum)
Pt comes into the ED via EMS from home for call of unresponsive pt, reports the pt is confused with repeating questions/statements and is lethargic. Hx of stroke.. CBG 279, HR 90, 98% RA, 168/96.  Pt states she has been having seizures and takes Topamax for it, states she has been very tired and aching all over since yesterday, states she had a zoom visit and the doctor called 911, states her husband was at home with her today.

## 2019-12-27 DIAGNOSIS — E049 Nontoxic goiter, unspecified: Secondary | ICD-10-CM | POA: Diagnosis not present

## 2019-12-27 DIAGNOSIS — E1140 Type 2 diabetes mellitus with diabetic neuropathy, unspecified: Secondary | ICD-10-CM | POA: Diagnosis not present

## 2019-12-27 DIAGNOSIS — I951 Orthostatic hypotension: Secondary | ICD-10-CM | POA: Diagnosis not present

## 2019-12-27 DIAGNOSIS — R69 Illness, unspecified: Secondary | ICD-10-CM | POA: Diagnosis not present

## 2019-12-27 DIAGNOSIS — Z794 Long term (current) use of insulin: Secondary | ICD-10-CM | POA: Diagnosis not present

## 2019-12-28 DIAGNOSIS — E1165 Type 2 diabetes mellitus with hyperglycemia: Secondary | ICD-10-CM | POA: Diagnosis not present

## 2019-12-28 DIAGNOSIS — I10 Essential (primary) hypertension: Secondary | ICD-10-CM | POA: Diagnosis not present

## 2019-12-28 DIAGNOSIS — N179 Acute kidney failure, unspecified: Secondary | ICD-10-CM | POA: Diagnosis not present

## 2019-12-28 NOTE — Progress Notes (Signed)
Patient ID: Amanda [name], female    DOB: 06-18-1966, 54 y.o.   MRN: [id number]  HPI  [name] is a 54 y/o female with a history of asthma, DM, HTN, stroke, thyroid disease, GERD, fibromyalgia, migraines, obstructive sleep apnea and chronic heart failure.   Had echo done 10/04/2018 but unable to view those results. Echo report from 03/29/18 reviewed and showed an EF of 55-60%.  Was in the ED 12/16/19 due to AMS where she was evaluated and released. She was in the ED 11/08/19 due to seizures. Topamax increased and she was released.   She presents today for a follow-up visit with a chief complaint of moderate fatigue with minimal exertion. She describes this as chronic in nature having been present for several years. She has associated shortness of breath, pedal edema, palpitations, abdominal distention, difficulty sleeping, fluctuating BP and gradual weight gain along with this. She denies any dizziness, chest pain or cough.   She says that she's checking her BP at home and continues to have wide swings in her BP's. Says that she'll have as low as 60/40 and then up into the 150's/ 90's. Has recently seen nephrology  Past Medical History:  Diagnosis Date  . ADD (attention deficit disorder)   . Anxiety   . Arthritis    knees, shoulder, upper back  . Asthma   . CFS (chronic fatigue syndrome)   . Chewing difficulty   . CHF (congestive heart failure) (HCC)   . Constipation   . Depression   . Diabetes mellitus without complication (HCC)   . Dyspnea   . Environmental allergies   . Fatty liver   . Fibromyalgia   . GERD (gastroesophageal reflux disease)   . GI bleed 10/02/2018  . Headache    migraines - 5x/mo  . Hypertension   . Hypokalemia 10/14/2017  . Hypothyroidism   . IBS (irritable bowel syndrome)   . Joint pain   . Lower extremity edema   . Migraines   . Motion sickness    ships  . Osteoarthritis   . Other specified disorders of thyroid   . Sleep apnea   . Stroke [facility])    no  residual deficits  . Swallowing difficulty   . Thyroid nodule    bilateral and goiter  . Vitamin D deficiency   . Wears contact lenses   . Wears dentures    partial upper   Past Surgical History:  Procedure Laterality Date  . ABDOMINAL HYSTERECTOMY  2000's  . ABDOMINAL SURGERY     laparoscopy x4 with lysis of adhesions  . APPENDECTOMY  1986  . CESAREAN SECTION  1992  . CHOLECYSTECTOMY N/A 04/03/2017   Procedure: LAPAROSCOPIC CHOLECYSTECTOMY;  Surgeon: Henrene Dodge, MD;  Location: [facility] ORS;  Service: General;  Laterality: N/A;  . COLONOSCOPY N/A 10/06/2018   Procedure: COLONOSCOPY;  Surgeon: Toney Reil, MD;  Location: [facility] ENDOSCOPY;  Service: Gastroenterology;  Laterality: N/A;  . COLONOSCOPY WITH PROPOFOL N/A 03/10/2017   Procedure: COLONOSCOPY WITH PROPOFOL;  Surgeon: Scot Jun, MD;  Location: [facility] ENDOSCOPY;  Service: Endoscopy;  Laterality: N/A;  . ECTOPIC PREGNANCY SURGERY  2000's  . ESOPHAGOGASTRODUODENOSCOPY (EGD) WITH PROPOFOL N/A 03/10/2017   Procedure: ESOPHAGOGASTRODUODENOSCOPY (EGD) WITH PROPOFOL;  Surgeon: Scot Jun, MD;  Location: [facility] ENDOSCOPY;  Service: Endoscopy;  Laterality: N/A;  . HERNIA REPAIR    . JOINT REPLACEMENT Right 12/16/12   knee- medial - makoplasty  . KNEE ARTHROSCOPY Right 2006   partial medial  and lateral meniscectomies  . KNEE ARTHROSCOPY Right 10/06/2015   Procedure: RIGHT KNEE ARTHROSCOPY WITH DEBRIDEMENT;  Surgeon: Erin Sons, MD;  Location[facility];  Service: Orthopedics;  Laterality: Right;  Diabetic - insulin and oral meds  . ROTATOR CUFF REPAIR Left 2006  . TUBAL LIGATION  2000's   Family History  Problem Relation Age of Onset  . Hypertension Mother   . Diabetes Mother   . Heart disease Mother   . Depression Mother   . Obesity Mother   . Hypertension Father   . Diabetes Father   . Allergies Father   . Kidney disease Father   . Cancer Father   . Breast cancer Neg Hx    Social History   Tobacco  Use  . Smoking status: Never Smoker  . Smokeless tobacco: Never Used  Substance Use Topics  . Alcohol use: No   Allergies  Allergen Reactions  . Penicillins Anaphylaxis    Has patient had a PCN reaction causing immediate rash, facial/tongue/throat swelling, SOB or lightheadedness with hypotension: Yes Has patient had a PCN reaction causing severe rash involving mucus membranes or skin necrosis: No Has patient had a PCN reaction that required hospitalization No Has patient had a PCN reaction occurring within the last 10 years: No If all of the above answers are "NO", then may proceed with Cephalosporin use.   . Ace Inhibitors Swelling   Prior to Admission medications   Medication Sig Start Date End Date Taking? Authorizing Provider  albuterol (PROVENTIL HFA) 108 (90 Base) MCG/ACT inhaler Inhale 2 puffs into the lungs every 4 (four) hours as needed for wheezing or shortness of breath. 07/13/19  Yes Parrett, Tammy S, NP  Ascorbic Acid (VITAMIN C CR) 1000 MG TBCR Take by mouth.   Yes [provider]  diltiazem (TIAZAC) 240 MG 24 hr capsule Take by mouth. 04/16/16  Yes [provider]  Dulaglutide (TRULICITY) 3 MG/0.5ML SOPN Inject 72 Units into the skin daily.   Yes [provider]  estradiol (ESTRACE) 0.1 MG/GM vaginal cream  10/15/19  Yes [provider]  fluticasone furoate-vilanterol (BREO ELLIPTA) 200-25 MCG/INH AEPB Inhale 1 puff into the lungs daily. 1 puff daily. 07/14/19  Yes Parrett, Tammy S, NP  furosemide (LASIX) 40 MG tablet Take 1 tablet (40 mg total) by mouth daily. 08/27/19  Yes Lurene Shadow, MD  gabapentin (NEURONTIN) 300 MG capsule Take 300 mg by mouth 2 (two) times daily.  03/22/18  Yes [provider]  ipratropium-albuterol (DUONEB) 0.5-2.5 (3) MG/3ML SOLN Inhale 3 mLs into the lungs 3 (three) times daily as needed (shortness of breath, wheezing or cough). 07/13/19  Yes Parrett, Virgel Bouquet, NP  JARDIANCE 25 MG TABS tablet  10/15/19   Yes [provider]  lidocaine (LIDODERM) 5 % 1 patch daily. 08/16/19  Yes [provider]  LINZESS 290 MCG CAPS capsule Take 1 tablet by mouth daily before breakfast.  02/28/17  Yes [provider]  liraglutide (VICTOZA) 18 MG/3ML SOPN Inject 0.5 mLs (3 mg total) into the skin daily. 11/03/19  Yes Helane Rima, DO  mirtazapine (REMERON SOL-TAB) 30 MG disintegrating tablet Take 30 mg by mouth at bedtime as needed (Nap).  03/18/18  Yes [provider]  mometasone (ELOCON) 0.1 % ointment Apply 1 application topically 2 (two) times daily. 10/15/19  Yes [provider]  potassium chloride SA (KLOR-CON) 20 MEQ tablet Take by mouth. 03/11/18  Yes [provider]  pravastatin (PRAVACHOL) 80 MG tablet Take  1 tablet (80 mg total) by mouth daily. 10/17/17 12/29/19 Yes Johnson, Clanford L, MD  propranolol (INDERAL) 80 MG tablet Take 80 mg by mouth daily.    Yes [provider]  tiotropium (SPIRIVA HANDIHALER) 18 MCG inhalation capsule Place 1 capsule (18 mcg total) into inhaler and inhale daily. 07/13/19 12/29/19 Yes Parrett, Tammy S, NP  tiZANidine (ZANAFLEX) 4 MG tablet Take 4 mg by mouth every 8 (eight) hours.    Yes [provider]  Vitamin D, Ergocalciferol, (DRISDOL) 1.25 MG (50000 UNIT) CAPS capsule Take 1 capsule (50,000 Units total) by mouth every 7 (seven) days. 11/03/19  Yes Helane Rima, DO     Review of Systems  Constitutional: Positive for fatigue (with minimal exertion). Negative for appetite change.  HENT: Negative for congestion and postnasal drip.   Eyes: Negative.   Respiratory: Positive for shortness of breath (with moderate exertion). Negative for cough and chest tightness.   Cardiovascular: Positive for palpitations and leg swelling (both feet). Negative for chest pain.  Gastrointestinal: Positive for abdominal distention ("little bloated"). Negative for abdominal pain.  Endocrine: Negative.   Genitourinary: Negative.    Musculoskeletal: Positive for arthralgias (knees hurting). Negative for back pain.  Skin: Negative.   Allergic/Immunologic: Negative.   Neurological: Negative for dizziness and light-headedness.  Hematological: Negative for adenopathy. Does not bruise/bleed easily.  Psychiatric/Behavioral: Positive for sleep disturbance (sleeping on 2 pillows). Negative for dysphoric mood. The patient is not nervous/anxious.    Vitals:   12/29/19 1130  BP: (!) 155/93  Pulse: 83  Resp: 18  SpO2: 97%  Weight: 215 lb (97.5 kg)  Height: 5\' 5"  (1.651 m)   Wt Readings from Last 3 Encounters:  12/29/19 215 lb (97.5 kg)  12/16/19 200 lb (90.7 kg)  11/17/19 217 lb (98.4 kg)   Lab Results  Component Value Date   CREATININE 0.78 12/16/2019   CREATININE 0.93 11/08/2019   CREATININE 1.35 (H) 10/24/2019    Physical Exam Vitals and nursing note reviewed.  Constitutional:      Appearance: She is well-developed.  HENT:     Head: Normocephalic and atraumatic.  Neck:     Thyroid: No thyromegaly.  Cardiovascular:     Rate and Rhythm: Normal rate and regular rhythm.  Pulmonary:     Effort: Pulmonary effort is normal. No respiratory distress.     Breath sounds: No wheezing or rales.  Abdominal:     General: There is no distension.     Palpations: Abdomen is soft.  Musculoskeletal:     Cervical back: Normal range of motion and neck supple.     Right lower leg: No tenderness. Edema (nonpitting in foot) present.     Left lower leg: No tenderness. Edema (nonpitting in foot) present.  Skin:    General: Skin is warm and dry.  Neurological:     Mental Status: She is alert and oriented to person, place, and time.  Psychiatric:        Behavior: Behavior normal.    Assessment & Plan:  1: Chronic heart failure with preserved ejection fraction- - NYHA class III - euvolemic today - weighing daily and she was reminded to call for an overnight weight gain of >2 pounds or a weekly weight gain of >5 pounds -  weight up 6 pounds from last visit here 4 months ago - follows with cardiology 10/26/2019); waiting on appointment to be scheduled - not adding salt and it was reviewed about keeping daily sodium intake to 2000mg  daily -  drinking ~ 2L of water daily - get compression socks and wear them daily with removal at bedtime - BNP 10/24/19 was 22.0   2: HTN- - BP mildly elevated today although has a history of hypotension resulting in syncopal episodes - has not taken her medications yet today - continue to check BP at home; she is aware that we are limited in treating her HTN due to her syncope/ dehydration - saw PCP Juleen China) 11/17/19 & had telemedicine visit yesterday - BMP 12/16/19 reviewed and showed sodium 138, potassium 4.2, creatinine 0.78 and GFR >60  3: DM- - A1c 08/05/2019 was 11.7% - nonfasting glucose in clinic today was 230 - saw endocrinology Pasty Arch) 06/29/18 - saw nephrology Percell Miller) 12/27/19   Patient did not bring her medications nor a list. Each medication was verbally reviewed with the patient and she was encouraged to bring the bottles to every visit to confirm accuracy of list.  Return in 3 months (per patient's request) or sooner for any questions/ problems before then.

## 2019-12-29 ENCOUNTER — Other Ambulatory Visit: Payer: Self-pay

## 2019-12-29 ENCOUNTER — Encounter: Payer: Self-pay | Admitting: Family

## 2019-12-29 ENCOUNTER — Ambulatory Visit: Payer: Medicare HMO | Attending: Family | Admitting: Family

## 2019-12-29 VITALS — BP 155/93 | HR 83 | Resp 18 | Ht 65.0 in | Wt 215.0 lb

## 2019-12-29 DIAGNOSIS — F329 Major depressive disorder, single episode, unspecified: Secondary | ICD-10-CM | POA: Diagnosis not present

## 2019-12-29 DIAGNOSIS — K760 Fatty (change of) liver, not elsewhere classified: Secondary | ICD-10-CM | POA: Diagnosis not present

## 2019-12-29 DIAGNOSIS — F419 Anxiety disorder, unspecified: Secondary | ICD-10-CM | POA: Insufficient documentation

## 2019-12-29 DIAGNOSIS — Z7951 Long term (current) use of inhaled steroids: Secondary | ICD-10-CM | POA: Insufficient documentation

## 2019-12-29 DIAGNOSIS — Z833 Family history of diabetes mellitus: Secondary | ICD-10-CM | POA: Diagnosis not present

## 2019-12-29 DIAGNOSIS — I5032 Chronic diastolic (congestive) heart failure: Secondary | ICD-10-CM

## 2019-12-29 DIAGNOSIS — Z8249 Family history of ischemic heart disease and other diseases of the circulatory system: Secondary | ICD-10-CM | POA: Insufficient documentation

## 2019-12-29 DIAGNOSIS — G4733 Obstructive sleep apnea (adult) (pediatric): Secondary | ICD-10-CM | POA: Insufficient documentation

## 2019-12-29 DIAGNOSIS — Z8673 Personal history of transient ischemic attack (TIA), and cerebral infarction without residual deficits: Secondary | ICD-10-CM | POA: Diagnosis not present

## 2019-12-29 DIAGNOSIS — R69 Illness, unspecified: Secondary | ICD-10-CM | POA: Diagnosis not present

## 2019-12-29 DIAGNOSIS — E119 Type 2 diabetes mellitus without complications: Secondary | ICD-10-CM

## 2019-12-29 DIAGNOSIS — K589 Irritable bowel syndrome without diarrhea: Secondary | ICD-10-CM | POA: Diagnosis not present

## 2019-12-29 DIAGNOSIS — Z7984 Long term (current) use of oral hypoglycemic drugs: Secondary | ICD-10-CM | POA: Diagnosis not present

## 2019-12-29 DIAGNOSIS — Z880 Allergy status to penicillin: Secondary | ICD-10-CM | POA: Insufficient documentation

## 2019-12-29 DIAGNOSIS — Z79899 Other long term (current) drug therapy: Secondary | ICD-10-CM | POA: Diagnosis not present

## 2019-12-29 DIAGNOSIS — I110 Hypertensive heart disease with heart failure: Secondary | ICD-10-CM | POA: Insufficient documentation

## 2019-12-29 DIAGNOSIS — K219 Gastro-esophageal reflux disease without esophagitis: Secondary | ICD-10-CM | POA: Insufficient documentation

## 2019-12-29 DIAGNOSIS — J45909 Unspecified asthma, uncomplicated: Secondary | ICD-10-CM | POA: Diagnosis not present

## 2019-12-29 DIAGNOSIS — F4310 Post-traumatic stress disorder, unspecified: Secondary | ICD-10-CM | POA: Diagnosis not present

## 2019-12-29 DIAGNOSIS — E039 Hypothyroidism, unspecified: Secondary | ICD-10-CM | POA: Diagnosis not present

## 2019-12-29 DIAGNOSIS — Z794 Long term (current) use of insulin: Secondary | ICD-10-CM

## 2019-12-29 DIAGNOSIS — F411 Generalized anxiety disorder: Secondary | ICD-10-CM | POA: Diagnosis not present

## 2019-12-29 DIAGNOSIS — M797 Fibromyalgia: Secondary | ICD-10-CM | POA: Insufficient documentation

## 2019-12-29 DIAGNOSIS — I10 Essential (primary) hypertension: Secondary | ICD-10-CM

## 2019-12-29 LAB — GLUCOSE, CAPILLARY: Glucose-Capillary: 230 mg/dL — ABNORMAL HIGH (ref 70–99)

## 2019-12-29 NOTE — Patient Instructions (Addendum)
Continue weighing daily and call for an overnight weight gain of > 2 pounds or a weekly weight gain of >5 pounds.   Get the compression socks and put them on every morning with removal at bedtime.

## 2020-01-06 DIAGNOSIS — I951 Orthostatic hypotension: Secondary | ICD-10-CM | POA: Diagnosis not present

## 2020-01-11 ENCOUNTER — Ambulatory Visit (INDEPENDENT_AMBULATORY_CARE_PROVIDER_SITE_OTHER): Payer: Medicare HMO | Admitting: Family Medicine

## 2020-01-11 ENCOUNTER — Encounter (INDEPENDENT_AMBULATORY_CARE_PROVIDER_SITE_OTHER): Payer: Self-pay | Admitting: Family Medicine

## 2020-01-11 ENCOUNTER — Other Ambulatory Visit: Payer: Self-pay

## 2020-01-11 VITALS — BP 127/81 | HR 77 | Temp 98.0°F | Ht 65.0 in | Wt 219.0 lb

## 2020-01-11 DIAGNOSIS — I5032 Chronic diastolic (congestive) heart failure: Secondary | ICD-10-CM

## 2020-01-11 DIAGNOSIS — G40802 Other epilepsy, not intractable, without status epilepticus: Secondary | ICD-10-CM

## 2020-01-11 DIAGNOSIS — E559 Vitamin D deficiency, unspecified: Secondary | ICD-10-CM

## 2020-01-11 DIAGNOSIS — E1165 Type 2 diabetes mellitus with hyperglycemia: Secondary | ICD-10-CM

## 2020-01-11 DIAGNOSIS — Z794 Long term (current) use of insulin: Secondary | ICD-10-CM | POA: Diagnosis not present

## 2020-01-11 DIAGNOSIS — Z6836 Body mass index (BMI) 36.0-36.9, adult: Secondary | ICD-10-CM | POA: Diagnosis not present

## 2020-01-11 MED ORDER — VITAMIN D (ERGOCALCIFEROL) 1.25 MG (50000 UNIT) PO CAPS: 50000.0000 [IU] | ORAL_CAPSULE | ORAL | 0 refills | Status: DC

## 2020-01-11 MED ORDER — LIRAGLUTIDE 18 MG/3ML ~~LOC~~ SOPN: 3.0000 mg | PEN_INJECTOR | Freq: Every day | SUBCUTANEOUS | 0 refills | Status: DC

## 2020-01-11 NOTE — Progress Notes (Signed)
Chief Complaint:   OBESITY Amanda [name] is here to discuss her progress with her obesity treatment plan along with follow-up of her obesity related diagnoses. Amanda [name] is on the Category 2 Plan and states she is following her eating plan approximately 0% of the time. Amanda [name] states she is weight lifting for 45-60 minutes 4-5 times per week.  Today's visit was #: 4 Starting weight: 212 lbs Starting date: 10/09/2019 Today's weight: 219 lbs Today's date: 01/11/2020 Total lbs lost to date: 0 Total lbs lost since last in-office visit: 0  Interim History: [name] notes she has not stuck to Category 2 plan well over the last few months. Her last office visit was 11/17/2019. She notes several personal issues have kept her from following up.   Subjective:   1. 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. She reports epilepsy is from her prior stroke. She is on Topamax twice a day.  2. Type 2 diabetes mellitus with hyperglycemia, with long-term current use of insulin (HCC) Amanda [name] is on Tresiba 72 units, Jardiance, and Victoza 3 mg. Her diabetes mellitus is managed by Dr. Letta Pate. Her fasting BGs range between 218 and 300, and non-fasting are as high as 300's. She denies hypoglycemia. Lab Results  Component Value Date   HGBA1C 11.7 (H) 08/05/2019    3. Vitamin D deficiency Amanda [name]'s last Vit D level was low at 21.9 on 08/05/2019. She is on prescription Vit D.  4. Chronic diastolic heart failure (HCC) Amanda [name] notes she has had increase in lower extremity swelling recently. Last Cardiology visit was 12/29/2019. She reports compliance with weighing daily and calling the cardiology office for a gain of >2 lbs overnight.  Assessment/Plan:   1. 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  2. Type 2 diabetes mellitus with hyperglycemia, with long-term current use of  insulin (HCC)  We will check A1c today, and we will refill Victoza for 1 month.  - Hemoglobin A1c - liraglutide (VICTOZA) 18 MG/3ML SOPN; Inject 0.5 mLs (3 mg total) into the skin daily.  Dispense: 5 pen; Refill: 0  3. Vitamin D deficiency Low Vitamin D level contributes to fatigue and are associated with obesity, breast, and colon cancer. We will check labs today, and we will refill prescription Vitamin D for 1 month. Amanda [name] will follow-up for routine testing of Vitamin D, at least 2-3 times per year to avoid over-replacement.  - VITAMIN D 25 Hydroxy (Vit-D Deficiency, Fractures)  - Vitamin D, Ergocalciferol, (DRISDOL) 1.25 MG (50000 UNIT) CAPS capsule; Take 1 capsule (50,000 Units total) by mouth every 7 (seven) days.  Dispense: 4 capsule; Refill: 0  4. Chronic diastolic heart failure (HCC) Amanda [name] will continue with daily weights and follow up with Cardiology. She is to limit her fluids to <2 liters per day (67 oz) per cardiology.  5. Class 2 severe obesity with serious comorbidity and body mass index (BMI) of 36.0 to 36.9 in adult, unspecified obesity type (HCC) Amanda [name] is currently in the action stage of change. As such, her goal is to continue with weight loss efforts. She has agreed to the Category 2 Plan.   Exercise goals: As is.  Behavioral modification strategies: planning for success.  Amanda [name] has agreed to follow-up with our clinic in 2 weeks with Dr. Earlene Plater. She was informed of the importance of frequent follow-up visits to maximize her success with intensive lifestyle modifications  for her multiple health conditions.   Amanda [name] was informed we would discuss her lab results at her next visit unless there is a critical issue that needs to be addressed sooner. Amanda [name] agreed to keep her next visit at the agreed upon time to discuss these results.  Objective:   Blood pressure 127/81, pulse 77, temperature 98 F (36.7 C), temperature source Oral, height 5\' 5"  (1.651 m), weight 219  lb (99.3 kg), SpO2 96 %. Body mass index is 36.44 kg/m.  General: Cooperative, alert, well developed, in no acute distress. HEENT: Conjunctivae and lids unremarkable. Cardiovascular: Regular rhythm.  Lungs: Normal work of breathing. Neurologic: No focal deficits.   Lab Results  Component Value Date   CREATININE 0.78 12/16/2019   BUN 24 (H) 12/16/2019   NA 138 12/16/2019   K 4.2 12/16/2019   CL 102 12/16/2019   CO2 26 12/16/2019   Lab Results  Component Value Date   ALT 58 (H) 12/16/2019   AST 31 12/16/2019   ALKPHOS 114 12/16/2019   BILITOT 0.6 12/16/2019   Lab Results  Component Value Date   HGBA1C 11.7 (H) 08/05/2019   HGBA1C 7.6 (H) 10/14/2017   No results found for: INSULIN Lab Results  Component Value Date   TSH 2.060 10/19/2019   Lab Results  Component Value Date   CHOL 245 (H) 10/19/2019   HDL 87 10/19/2019   LDLCALC 118 (H) 10/19/2019   TRIG 237 (H) 10/19/2019   CHOLHDL 2.8 10/19/2019   Lab Results  Component Value Date   WBC 6.1 12/16/2019   HGB 13.9 12/16/2019   HCT 42.6 12/16/2019   MCV 88.9 12/16/2019   PLT 324 12/16/2019   Lab Results  Component Value Date   IRON 89 10/19/2019   TIBC 459 (H) 10/19/2019   FERRITIN 61 10/19/2019    Obesity Behavioral Intervention Documentation for Insurance:   Approximately 15 minutes were spent on the discussion below.  ASK: We discussed the diagnosis of obesity with Amanda [name] today and Amanda [name] agreed to give [place] permission to discuss obesity behavioral modification therapy today.  ASSESS: Amanda [name] has the diagnosis of obesity and her BMI today is 36.44. Amanda [name] is in the action stage of change.   ADVISE: Amanda [name] was educated on the multiple health risks of obesity as well as the benefit of weight loss to improve her health. She was advised of the need for long term treatment and the importance of lifestyle modifications to improve her current health and to decrease her risk of future health  problems.  AGREE: Multiple dietary modification options and treatment options were discussed and Amanda [name] agreed to follow the recommendations documented in the above note.  ARRANGE: Amanda [name] was educated on the importance of frequent visits to treat obesity as outlined per CMS and USPSTF guidelines and agreed to schedule her next follow up appointment today.  Attestation Statements:   Reviewed by clinician on day of visit: allergies, medications, problem list, medical history, surgical history, family history, social history, and previous encounter notes.   Wilhemena Durie, am acting as Location manager for [employer], FNP-C.  I have reviewed the above documentation for accuracy and completeness, and I agree with the above. -  Georgianne Fick, FNP

## 2020-01-12 ENCOUNTER — Encounter (INDEPENDENT_AMBULATORY_CARE_PROVIDER_SITE_OTHER): Payer: Self-pay | Admitting: Family Medicine

## 2020-01-12 DIAGNOSIS — E559 Vitamin D deficiency, unspecified: Secondary | ICD-10-CM | POA: Insufficient documentation

## 2020-01-12 DIAGNOSIS — G40909 Epilepsy, unspecified, not intractable, without status epilepticus: Secondary | ICD-10-CM | POA: Insufficient documentation

## 2020-01-12 LAB — HEMOGLOBIN A1C
Est. average glucose Bld gHb Est-mCnc: 203 mg/dL
Hgb A1c MFr Bld: 8.7 % — ABNORMAL HIGH (ref 4.8–5.6)

## 2020-01-12 LAB — VITAMIN D 25 HYDROXY (VIT D DEFICIENCY, FRACTURES): Vit D, 25-Hydroxy: 31.8 ng/mL (ref 30.0–100.0)

## 2020-01-18 DIAGNOSIS — I951 Orthostatic hypotension: Secondary | ICD-10-CM | POA: Diagnosis not present

## 2020-01-18 DIAGNOSIS — D649 Anemia, unspecified: Secondary | ICD-10-CM | POA: Diagnosis not present

## 2020-01-18 DIAGNOSIS — E1165 Type 2 diabetes mellitus with hyperglycemia: Secondary | ICD-10-CM | POA: Diagnosis not present

## 2020-01-19 DIAGNOSIS — I10 Essential (primary) hypertension: Secondary | ICD-10-CM | POA: Diagnosis not present

## 2020-01-19 DIAGNOSIS — E0590 Thyrotoxicosis, unspecified without thyrotoxic crisis or storm: Secondary | ICD-10-CM | POA: Diagnosis not present

## 2020-01-24 DIAGNOSIS — R69 Illness, unspecified: Secondary | ICD-10-CM | POA: Diagnosis not present

## 2020-01-24 DIAGNOSIS — F411 Generalized anxiety disorder: Secondary | ICD-10-CM | POA: Diagnosis not present

## 2020-01-24 DIAGNOSIS — F4310 Post-traumatic stress disorder, unspecified: Secondary | ICD-10-CM | POA: Diagnosis not present

## 2020-02-05 ENCOUNTER — Encounter (INDEPENDENT_AMBULATORY_CARE_PROVIDER_SITE_OTHER): Payer: Self-pay | Admitting: Adult Health

## 2020-02-07 ENCOUNTER — Ambulatory Visit (INDEPENDENT_AMBULATORY_CARE_PROVIDER_SITE_OTHER): Payer: Medicare HMO | Admitting: Family Medicine

## 2020-02-07 NOTE — Telephone Encounter (Signed)
Please r/s

## 2020-02-08 ENCOUNTER — Ambulatory Visit (INDEPENDENT_AMBULATORY_CARE_PROVIDER_SITE_OTHER): Payer: Medicare HMO | Admitting: Adult Health

## 2020-02-10 DIAGNOSIS — R569 Unspecified convulsions: Secondary | ICD-10-CM | POA: Diagnosis not present

## 2020-02-10 DIAGNOSIS — Z1329 Encounter for screening for other suspected endocrine disorder: Secondary | ICD-10-CM | POA: Diagnosis not present

## 2020-02-10 DIAGNOSIS — I10 Essential (primary) hypertension: Secondary | ICD-10-CM | POA: Diagnosis not present

## 2020-02-11 DIAGNOSIS — E1165 Type 2 diabetes mellitus with hyperglycemia: Secondary | ICD-10-CM | POA: Diagnosis not present

## 2020-02-11 DIAGNOSIS — E785 Hyperlipidemia, unspecified: Secondary | ICD-10-CM | POA: Diagnosis not present

## 2020-02-11 DIAGNOSIS — I10 Essential (primary) hypertension: Secondary | ICD-10-CM | POA: Diagnosis not present

## 2020-02-16 DIAGNOSIS — F411 Generalized anxiety disorder: Secondary | ICD-10-CM | POA: Diagnosis not present

## 2020-02-16 DIAGNOSIS — R69 Illness, unspecified: Secondary | ICD-10-CM | POA: Diagnosis not present

## 2020-02-16 DIAGNOSIS — F4310 Post-traumatic stress disorder, unspecified: Secondary | ICD-10-CM | POA: Diagnosis not present

## 2020-02-29 DIAGNOSIS — R69 Illness, unspecified: Secondary | ICD-10-CM | POA: Diagnosis not present

## 2020-03-15 ENCOUNTER — Telehealth: Payer: Self-pay | Admitting: Internal Medicine

## 2020-03-15 NOTE — Telephone Encounter (Signed)
Sounds like needs to be seen by Provider. CHF can do this as well. Needs an examination by a office provider

## 2020-03-15 NOTE — Telephone Encounter (Signed)
Spoke to pt and relayed below recommendations.  We do not have any availability in the  office.  I offered appt for 03/21/2020 with Elisha Headland, NP in [facility] office. Pt stated that she would reach out to PCP to see if they could get her in sooner.  Advised pt to call us back if she is unable to get in with PCP sooner then 03/21/2020. Nothing further is needed at this time.

## 2020-03-15 NOTE — Telephone Encounter (Signed)
Pt reports of increased sob with exertion and wheezing x12mo. Sx worsen with heat. Using albuterol Q2H, duoneb Q3H and spiriva once daily with some relief in sx. Denies fever, chills or sweats.    Dr. Belia Heman, please advise. Former DR pt.

## 2020-03-16 DIAGNOSIS — I10 Essential (primary) hypertension: Secondary | ICD-10-CM | POA: Diagnosis not present

## 2020-03-16 DIAGNOSIS — E785 Hyperlipidemia, unspecified: Secondary | ICD-10-CM | POA: Diagnosis not present

## 2020-03-23 ENCOUNTER — Encounter (INDEPENDENT_AMBULATORY_CARE_PROVIDER_SITE_OTHER): Payer: Self-pay

## 2020-03-23 ENCOUNTER — Ambulatory Visit (INDEPENDENT_AMBULATORY_CARE_PROVIDER_SITE_OTHER): Payer: Self-pay | Admitting: Family Medicine

## 2020-03-23 DIAGNOSIS — E785 Hyperlipidemia, unspecified: Secondary | ICD-10-CM | POA: Diagnosis not present

## 2020-03-23 DIAGNOSIS — I509 Heart failure, unspecified: Secondary | ICD-10-CM | POA: Diagnosis not present

## 2020-03-23 DIAGNOSIS — E669 Obesity, unspecified: Secondary | ICD-10-CM | POA: Diagnosis not present

## 2020-03-23 DIAGNOSIS — E1165 Type 2 diabetes mellitus with hyperglycemia: Secondary | ICD-10-CM | POA: Diagnosis not present

## 2020-03-23 DIAGNOSIS — I10 Essential (primary) hypertension: Secondary | ICD-10-CM | POA: Diagnosis not present

## 2020-03-23 DIAGNOSIS — R569 Unspecified convulsions: Secondary | ICD-10-CM | POA: Diagnosis not present

## 2020-03-28 DIAGNOSIS — R69 Illness, unspecified: Secondary | ICD-10-CM | POA: Diagnosis not present

## 2020-03-28 DIAGNOSIS — E1165 Type 2 diabetes mellitus with hyperglycemia: Secondary | ICD-10-CM | POA: Diagnosis not present

## 2020-03-28 DIAGNOSIS — M25562 Pain in left knee: Secondary | ICD-10-CM | POA: Diagnosis not present

## 2020-03-28 DIAGNOSIS — I10 Essential (primary) hypertension: Secondary | ICD-10-CM | POA: Diagnosis not present

## 2020-03-28 NOTE — Progress Notes (Deleted)
Patient ID: Amanda [name], female    DOB: 1966/07/16, 54 y.o.   MRN: [id number]  HPI  Amanda [name] is a 54 y/o female with a history of asthma, DM, HTN, stroke, thyroid disease, GERD, fibromyalgia, migraines, obstructive sleep apnea and chronic heart failure.   Had echo done 10/04/2018 but unable to view those results. Echo report from 03/29/18 reviewed and showed an EF of 55-60%.  Was in the ED 12/16/19 due to AMS where she was evaluated and released. She was in the ED 11/08/19 due to seizures. Topamax increased and she was released.   She presents today for a follow-up visit with a chief complaint of   Past Medical History:  Diagnosis Date  . ADD (attention deficit disorder)   . Anxiety   . Arthritis    knees, shoulder, upper back  . Asthma   . CFS (chronic fatigue syndrome)   . Chewing difficulty   . CHF (congestive heart failure) (HCC)   . Constipation   . Depression   . Diabetes mellitus without complication (HCC)   . Dyspnea   . Environmental allergies   . Fatty liver   . Fibromyalgia   . GERD (gastroesophageal reflux disease)   . GI bleed 10/02/2018  . Headache    migraines - 5x/mo  . Hypertension   . Hypokalemia 10/14/2017  . Hypothyroidism   . IBS (irritable bowel syndrome)   . Joint pain   . Lower extremity edema   . Migraines   . Motion sickness    ships  . Osteoarthritis   . Other specified disorders of thyroid   . Sleep apnea   . Stroke [facility])    no residual deficits  . Swallowing difficulty   . Thyroid nodule    bilateral and goiter  . Vitamin D deficiency   . Wears contact lenses   . Wears dentures    partial upper   Past Surgical History:  Procedure Laterality Date  . ABDOMINAL HYSTERECTOMY  2000's  . ABDOMINAL SURGERY     laparoscopy x4 with lysis of adhesions  . APPENDECTOMY  1986  . CESAREAN SECTION  1992  . CHOLECYSTECTOMY N/A 04/03/2017   Procedure: LAPAROSCOPIC CHOLECYSTECTOMY;  Surgeon: Henrene Dodge, MD;  Location: [facility] ORS;  Service:  General;  Laterality: N/A;  . COLONOSCOPY N/A 10/06/2018   Procedure: COLONOSCOPY;  Surgeon: Toney Reil, MD;  Location: [facility] ENDOSCOPY;  Service: Gastroenterology;  Laterality: N/A;  . COLONOSCOPY WITH PROPOFOL N/A 03/10/2017   Procedure: COLONOSCOPY WITH PROPOFOL;  Surgeon: Scot Jun, MD;  Location: [facility] ENDOSCOPY;  Service: Endoscopy;  Laterality: N/A;  . ECTOPIC PREGNANCY SURGERY  2000's  . ESOPHAGOGASTRODUODENOSCOPY (EGD) WITH PROPOFOL N/A 03/10/2017   Procedure: ESOPHAGOGASTRODUODENOSCOPY (EGD) WITH PROPOFOL;  Surgeon: Scot Jun, MD;  Location: [facility] ENDOSCOPY;  Service: Endoscopy;  Laterality: N/A;  . HERNIA REPAIR    . JOINT REPLACEMENT Right 12/16/12   knee- medial - makoplasty  . KNEE ARTHROSCOPY Right 2006   partial medial and lateral meniscectomies  . KNEE ARTHROSCOPY Right 10/06/2015   Procedure: RIGHT KNEE ARTHROSCOPY WITH DEBRIDEMENT;  Surgeon: Erin Sons, MD;  Location: [facility];  Service: Orthopedics;  Laterality: Right;  Diabetic - insulin and oral meds  . ROTATOR CUFF REPAIR Left 2006  . TUBAL LIGATION  2000's   Family History  Problem Relation Age of Onset  . Hypertension Mother   . Diabetes Mother   . Heart disease Mother   . Depression Mother   .  Obesity Mother   . Hypertension Father   . Diabetes Father   . Allergies Father   . Kidney disease Father   . Cancer Father   . Breast cancer Neg Hx    Social History   Tobacco Use  . Smoking status: Never Smoker  . Smokeless tobacco: Never Used  Substance Use Topics  . Alcohol use: No   Allergies  Allergen Reactions  . Penicillins Anaphylaxis    Has patient had a PCN reaction causing immediate rash, facial/tongue/throat swelling, SOB or lightheadedness with hypotension: Yes Has patient had a PCN reaction causing severe rash involving mucus membranes or skin necrosis: No Has patient had a PCN reaction that required hospitalization No Has patient had a PCN reaction occurring  within the last 10 years: No If all of the above answers are "NO", then may proceed with Cephalosporin use.   . Ace Inhibitors Swelling      Review of Systems  Constitutional: Positive for fatigue (with minimal exertion). Negative for appetite change.  HENT: Negative for congestion and postnasal drip.   Eyes: Negative.   Respiratory: Positive for shortness of breath (with moderate exertion). Negative for cough and chest tightness.   Cardiovascular: Positive for palpitations and leg swelling (both feet). Negative for chest pain.  Gastrointestinal: Positive for abdominal distention ("little bloated"). Negative for abdominal pain.  Endocrine: Negative.   Genitourinary: Negative.   Musculoskeletal: Positive for arthralgias (knees hurting). Negative for back pain.  Skin: Negative.   Allergic/Immunologic: Negative.   Neurological: Negative for dizziness and light-headedness.  Hematological: Negative for adenopathy. Does not bruise/bleed easily.  Psychiatric/Behavioral: Positive for sleep disturbance (sleeping on 2 pillows). Negative for dysphoric mood. The patient is not nervous/anxious.     Physical Exam Vitals and nursing note reviewed.  Constitutional:      Appearance: She is well-developed.  HENT:     Head: Normocephalic and atraumatic.  Neck:     Thyroid: No thyromegaly.  Cardiovascular:     Rate and Rhythm: Normal rate and regular rhythm.  Pulmonary:     Effort: Pulmonary effort is normal. No respiratory distress.     Breath sounds: No wheezing or rales.  Abdominal:     General: There is no distension.     Palpations: Abdomen is soft.  Musculoskeletal:     Cervical back: Normal range of motion and neck supple.     Right lower leg: No tenderness. Edema (nonpitting in foot) present.     Left lower leg: No tenderness. Edema (nonpitting in foot) present.  Skin:    General: Skin is warm and dry.  Neurological:     Mental Status: She is alert and oriented to person, place,  and time.  Psychiatric:        Behavior: Behavior normal.    Assessment & Plan:  1: Chronic heart failure with preserved ejection fraction- - NYHA class III - euvolemic today - weighing daily and she was reminded to call for an overnight weight gain of >2 pounds or a weekly weight gain of >5 pounds - weight 215 pounds from last visit here 3 months ago - follows with cardiology [name]); - not adding salt and it was reviewed about keeping daily sodium intake to 2000mg  daily - drinking ~ 2L of water daily - get compression socks and wear them daily with removal at bedtime - BNP 10/24/19 was 22.0   2: HTN- - BP  - continue to check BP at home; she is aware that we are limited  in treating her HTN due to her history of syncope/ dehydration - saw PCP Earlene Plater) 11/17/19  - BMP 12/16/19 reviewed and showed sodium 138, potassium 4.2, creatinine 0.78 and GFR >60  3: DM- - A1c 08/05/2019 was 11.7% - nonfasting glucose in clinic today was  - saw endocrinology Rosaura Carpenter) 06/29/18 - saw nephrology Eulah Pont) 12/27/19   Patient did not bring her medications nor a list. Each medication was verbally reviewed with the patient and she was encouraged to bring the bottles to every visit to confirm accuracy of list.

## 2020-03-29 ENCOUNTER — Ambulatory Visit: Payer: Medicare HMO | Admitting: Family

## 2020-03-29 ENCOUNTER — Telehealth: Payer: Self-pay | Admitting: Family

## 2020-03-29 DIAGNOSIS — M1711 Unilateral primary osteoarthritis, right knee: Secondary | ICD-10-CM | POA: Diagnosis not present

## 2020-03-29 DIAGNOSIS — M1712 Unilateral primary osteoarthritis, left knee: Secondary | ICD-10-CM | POA: Diagnosis not present

## 2020-03-29 DIAGNOSIS — M170 Bilateral primary osteoarthritis of knee: Secondary | ICD-10-CM | POA: Diagnosis not present

## 2020-03-29 DIAGNOSIS — M1612 Unilateral primary osteoarthritis, left hip: Secondary | ICD-10-CM | POA: Diagnosis not present

## 2020-03-29 DIAGNOSIS — M13852 Other specified arthritis, left hip: Secondary | ICD-10-CM | POA: Diagnosis not present

## 2020-03-29 NOTE — Telephone Encounter (Signed)
Patient did not show for her [facility] Failure [facility] appointment on 03/29/20. Will attempt to reschedule.  °

## 2020-03-31 ENCOUNTER — Ambulatory Visit: Payer: Medicare HMO | Admitting: Cardiovascular Disease

## 2020-03-31 NOTE — Progress Notes (Deleted)
NO SHOW

## 2020-04-03 ENCOUNTER — Encounter: Payer: Self-pay | Admitting: Cardiovascular Disease

## 2020-04-12 DIAGNOSIS — R69 Illness, unspecified: Secondary | ICD-10-CM | POA: Diagnosis not present

## 2020-04-12 DIAGNOSIS — F411 Generalized anxiety disorder: Secondary | ICD-10-CM | POA: Diagnosis not present

## 2020-04-12 DIAGNOSIS — F4310 Post-traumatic stress disorder, unspecified: Secondary | ICD-10-CM | POA: Diagnosis not present

## 2020-04-13 ENCOUNTER — Ambulatory Visit: Payer: Medicare HMO | Admitting: Pulmonary Disease

## 2020-04-21 ENCOUNTER — Encounter: Payer: Medicare HMO | Admitting: Primary Care

## 2020-04-21 ENCOUNTER — Other Ambulatory Visit: Payer: Self-pay

## 2020-04-21 NOTE — Progress Notes (Signed)
 Virtual Visit via Telephone Note  I connected with Amanda [name] on 04/21/20 at  3:00 PM EDT by telephone and verified that I am speaking with the correct person using two identifiers.  Location: Patient: Home  Provider: Office   I discussed the limitations, risks, security and privacy concerns of performing an evaluation and management service by telephone and the availability of in person appointments. I also discussed with the patient that there may be a patient responsible charge related to this service. The patient expressed understanding and agreed to proceed.   History of Present Illness: 54 year old female, never smoked.  Past medical history significant for moderate persistent asthma.  Former patient of Dr. Verdia, last seen by pulmonary nurse practitioner in office on 07/13/2019.  Maintained on Breo, Spiriva  HandiHaler and as needed albuterol /DuoNeb.  04/21/2020 Patient contacted today for follow-up telephone visit.  She called our office on 03/15/2020 with reports of increased shortness of breath with associated wheezing x2 months.  He was advised that she be seen in office for evaluation, she was offered an appointment with one of our nurse practitioners in [place] office however patient stated she would reach out to her primary care provider to see if she could be seen sooner.     Observations/Objective:   Assessment and Plan:   Follow Up Instructions:    I discussed the assessment and treatment plan with the patient. The patient was provided an opportunity to ask questions and all were answered. The patient agreed with the plan and demonstrated an understanding of the instructions.   The patient was advised to call back or seek an in-person evaluation if the symptoms worsen or if the condition fails to improve as anticipated.    Amanda LELON Ferrari, NP

## 2020-04-24 DIAGNOSIS — E669 Obesity, unspecified: Secondary | ICD-10-CM | POA: Diagnosis not present

## 2020-04-24 DIAGNOSIS — Z594 Lack of adequate food and safe drinking water: Secondary | ICD-10-CM | POA: Diagnosis not present

## 2020-04-24 DIAGNOSIS — R69 Illness, unspecified: Secondary | ICD-10-CM | POA: Diagnosis not present

## 2020-05-01 DIAGNOSIS — R69 Illness, unspecified: Secondary | ICD-10-CM | POA: Diagnosis not present

## 2020-05-01 DIAGNOSIS — Z1329 Encounter for screening for other suspected endocrine disorder: Secondary | ICD-10-CM | POA: Diagnosis not present

## 2020-05-01 DIAGNOSIS — E669 Obesity, unspecified: Secondary | ICD-10-CM | POA: Diagnosis not present

## 2020-05-01 DIAGNOSIS — M797 Fibromyalgia: Secondary | ICD-10-CM | POA: Diagnosis not present

## 2020-05-01 DIAGNOSIS — G8929 Other chronic pain: Secondary | ICD-10-CM | POA: Diagnosis not present

## 2020-05-01 DIAGNOSIS — E1165 Type 2 diabetes mellitus with hyperglycemia: Secondary | ICD-10-CM | POA: Diagnosis not present

## 2020-05-04 DIAGNOSIS — E1165 Type 2 diabetes mellitus with hyperglycemia: Secondary | ICD-10-CM | POA: Diagnosis not present

## 2020-05-04 DIAGNOSIS — I10 Essential (primary) hypertension: Secondary | ICD-10-CM | POA: Diagnosis not present

## 2020-05-04 DIAGNOSIS — E669 Obesity, unspecified: Secondary | ICD-10-CM | POA: Diagnosis not present

## 2020-05-07 ENCOUNTER — Encounter: Payer: Self-pay | Admitting: Emergency Medicine

## 2020-05-07 ENCOUNTER — Emergency Department: Payer: Medicare HMO

## 2020-05-07 ENCOUNTER — Observation Stay
Admission: EM | Admit: 2020-05-07 | Discharge: 2020-05-09 | Disposition: A | Discharge status: Home / Self Care | Payer: Medicare HMO | Attending: Family Medicine | Admitting: Family Medicine

## 2020-05-07 ENCOUNTER — Other Ambulatory Visit: Payer: Self-pay

## 2020-05-07 DIAGNOSIS — I517 Cardiomegaly: Secondary | ICD-10-CM | POA: Diagnosis not present

## 2020-05-07 DIAGNOSIS — E119 Type 2 diabetes mellitus without complications: Secondary | ICD-10-CM

## 2020-05-07 DIAGNOSIS — Z79899 Other long term (current) drug therapy: Secondary | ICD-10-CM | POA: Diagnosis not present

## 2020-05-07 DIAGNOSIS — R55 Syncope and collapse: Secondary | ICD-10-CM | POA: Diagnosis not present

## 2020-05-07 DIAGNOSIS — J45909 Unspecified asthma, uncomplicated: Secondary | ICD-10-CM | POA: Diagnosis not present

## 2020-05-07 DIAGNOSIS — J90 Pleural effusion, not elsewhere classified: Secondary | ICD-10-CM | POA: Diagnosis not present

## 2020-05-07 DIAGNOSIS — I959 Hypotension, unspecified: Secondary | ICD-10-CM

## 2020-05-07 DIAGNOSIS — J9811 Atelectasis: Secondary | ICD-10-CM | POA: Diagnosis not present

## 2020-05-07 DIAGNOSIS — I5022 Chronic systolic (congestive) heart failure: Secondary | ICD-10-CM | POA: Diagnosis not present

## 2020-05-07 DIAGNOSIS — R079 Chest pain, unspecified: Secondary | ICD-10-CM | POA: Diagnosis not present

## 2020-05-07 DIAGNOSIS — N183 Chronic kidney disease, stage 3 unspecified: Secondary | ICD-10-CM | POA: Diagnosis not present

## 2020-05-07 DIAGNOSIS — Z7984 Long term (current) use of oral hypoglycemic drugs: Secondary | ICD-10-CM | POA: Insufficient documentation

## 2020-05-07 DIAGNOSIS — I130 Hypertensive heart and chronic kidney disease with heart failure and stage 1 through stage 4 chronic kidney disease, or unspecified chronic kidney disease: Secondary | ICD-10-CM | POA: Diagnosis not present

## 2020-05-07 DIAGNOSIS — R404 Transient alteration of awareness: Secondary | ICD-10-CM | POA: Diagnosis not present

## 2020-05-07 DIAGNOSIS — E860 Dehydration: Secondary | ICD-10-CM | POA: Insufficient documentation

## 2020-05-07 DIAGNOSIS — I509 Heart failure, unspecified: Secondary | ICD-10-CM

## 2020-05-07 DIAGNOSIS — Z20822 Contact with and (suspected) exposure to covid-19: Secondary | ICD-10-CM | POA: Diagnosis not present

## 2020-05-07 DIAGNOSIS — Z03818 Encounter for observation for suspected exposure to other biological agents ruled out: Secondary | ICD-10-CM | POA: Diagnosis not present

## 2020-05-07 DIAGNOSIS — R531 Weakness: Secondary | ICD-10-CM | POA: Diagnosis not present

## 2020-05-07 DIAGNOSIS — E162 Hypoglycemia, unspecified: Secondary | ICD-10-CM | POA: Diagnosis not present

## 2020-05-07 DIAGNOSIS — R4020 Unspecified coma: Secondary | ICD-10-CM | POA: Diagnosis not present

## 2020-05-07 DIAGNOSIS — E161 Other hypoglycemia: Secondary | ICD-10-CM | POA: Diagnosis not present

## 2020-05-07 HISTORY — DX: Chronic kidney disease, unspecified: N18.9

## 2020-05-07 LAB — CBC WITH DIFFERENTIAL/PLATELET
Abs Immature Granulocytes: 0.01 10*3/uL (ref 0.00–0.07)
Basophils Absolute: 0 10*3/uL (ref 0.0–0.1)
Basophils Relative: 0 %
Eosinophils Absolute: 0 10*3/uL (ref 0.0–0.5)
Eosinophils Relative: 0 %
HCT: 37.1 % (ref 36.0–46.0)
Hemoglobin: 11.9 g/dL — ABNORMAL LOW (ref 12.0–15.0)
Immature Granulocytes: 0 %
Lymphocytes Relative: 38 %
Lymphs Abs: 1.9 10*3/uL (ref 0.7–4.0)
MCH: 29.1 pg (ref 26.0–34.0)
MCHC: 32.1 g/dL (ref 30.0–36.0)
MCV: 90.7 fL (ref 80.0–100.0)
Monocytes Absolute: 0.4 10*3/uL (ref 0.1–1.0)
Monocytes Relative: 8 %
Neutro Abs: 2.7 10*3/uL (ref 1.7–7.7)
Neutrophils Relative %: 54 %
Platelets: 258 10*3/uL (ref 150–400)
RBC: 4.09 MIL/uL (ref 3.87–5.11)
RDW: 14.8 % (ref 11.5–15.5)
WBC: 5.1 10*3/uL (ref 4.0–10.5)
nRBC: 0 % (ref 0.0–0.2)

## 2020-05-07 LAB — COMPREHENSIVE METABOLIC PANEL
ALT: 33 U/L (ref 0–44)
AST: 27 U/L (ref 15–41)
Albumin: 3.8 g/dL (ref 3.5–5.0)
Alkaline Phosphatase: 105 U/L (ref 38–126)
Anion gap: 10 (ref 5–15)
BUN: 25 mg/dL — ABNORMAL HIGH (ref 6–20)
CO2: 26 mmol/L (ref 22–32)
Calcium: 9 mg/dL (ref 8.9–10.3)
Chloride: 103 mmol/L (ref 98–111)
Creatinine, Ser: 1.2 mg/dL — ABNORMAL HIGH (ref 0.44–1.00)
GFR calc Af Amer: 59 mL/min — ABNORMAL LOW (ref >60)
GFR calc non Af Amer: 51 mL/min — ABNORMAL LOW (ref >60)
Glucose, Bld: 70 mg/dL (ref 70–99)
Potassium: 3.3 mmol/L — ABNORMAL LOW (ref 3.5–5.1)
Sodium: 139 mmol/L (ref 135–145)
Total Bilirubin: 0.6 mg/dL (ref 0.3–1.2)
Total Protein: 7 g/dL (ref 6.5–8.1)

## 2020-05-07 LAB — SARS CORONAVIRUS 2 BY RT PCR (HOSPITAL ORDER, PERFORMED IN ~~LOC~~ HOSPITAL LAB): SARS Coronavirus 2: NEGATIVE

## 2020-05-07 LAB — GLUCOSE, CAPILLARY: Glucose-Capillary: 80 mg/dL (ref 70–99)

## 2020-05-07 LAB — BRAIN NATRIURETIC PEPTIDE: B Natriuretic Peptide: 18.7 pg/mL (ref 0.0–100.0)

## 2020-05-07 LAB — LIPASE, BLOOD: Lipase: 32 U/L (ref 11–51)

## 2020-05-07 LAB — LACTIC ACID, PLASMA: Lactic Acid, Venous: 1.1 mmol/L (ref 0.5–1.9)

## 2020-05-07 LAB — TROPONIN I (HIGH SENSITIVITY): Troponin I (High Sensitivity): 2 ng/L (ref <18)

## 2020-05-07 MED ORDER — SODIUM CHLORIDE 0.9 % IV BOLUS
500.0000 mL | Freq: Once | INTRAVENOUS | Status: AC
  Administered 2020-05-07: 500 mL via INTRAVENOUS

## 2020-05-07 MED ORDER — IPRATROPIUM-ALBUTEROL 0.5-2.5 (3) MG/3ML IN SOLN
3.0000 mL | Freq: Once | RESPIRATORY_TRACT | Status: AC
  Administered 2020-05-07: 3 mL via RESPIRATORY_TRACT
  Filled 2020-05-07: qty 3

## 2020-05-07 MED ORDER — IOHEXOL 350 MG/ML SOLN
100.0000 mL | Freq: Once | INTRAVENOUS | Status: AC | PRN
  Administered 2020-05-07: 100 mL via INTRAVENOUS

## 2020-05-07 MED ORDER — SODIUM CHLORIDE 0.9 % IV BOLUS
1000.0000 mL | Freq: Once | INTRAVENOUS | Status: AC
  Administered 2020-05-07: 1000 mL via INTRAVENOUS

## 2020-05-07 MED ORDER — SODIUM CHLORIDE 0.9 % IV SOLN: Freq: Once | INTRAVENOUS | Status: AC

## 2020-05-07 NOTE — ED Notes (Signed)
Pt transported to CT ?

## 2020-05-07 NOTE — ED Notes (Signed)
Pt O2 decreased to mid-80s. Pt placed on 3L [place]. O2 currently 100% on 3L

## 2020-05-07 NOTE — ED Provider Notes (Signed)
[facility] Emergency Department Provider Note    First MD Initiated Contact with Patient 05/07/20 2056     (approximate)  I have reviewed the triage vital signs and the nursing notes.   HISTORY  Chief Complaint Hypotension    HPI Amanda [name] is a 54 y.o. female no significant past medical history presents to the ER after syncopal episode that occurred this evening.  Family called EMS and patient was found to be lethargic and hypotensive.  Difficult IV access.  No significant past medical history.  Did recently receive her maternal Covid vaccination.  There is some possible report of decreased p.o. intake over the past day or 2 as well as some chest pain.    Past Medical History:  Diagnosis Date  . Diabetes mellitus without complication (HCC)    History reviewed. No pertinent family history.  There are no problems to display for this patient.     Prior to Admission medications   Not on File    Allergies Penicillins and Ace inhibitors    Social History Social History   Tobacco Use  . Smoking status: Not on file  Substance Use Topics  . Alcohol use: Not on file  . Drug use: Not on file    Review of Systems Patient denies headaches, rhinorrhea, blurry vision, numbness, shortness of breath, chest pain, edema, cough, abdominal pain, nausea, vomiting, diarrhea, dysuria, fevers, rashes or hallucinations unless otherwise stated above in HPI. ____________________________________________   PHYSICAL EXAM:  VITAL SIGNS: Vitals:   05/07/20 2245 05/07/20 2300  BP: 97/60 95/64  Pulse: 63 (!) 59  Resp: 19 18  Temp:    SpO2: 100% 100%    Constitutional: drowsy, ill appearing, opens eyes to voice Eyes: Conjunctivae are normal.  Head: Atraumatic. Nose: No congestion/rhinnorhea. Mouth/Throat: Mucous membranes are dry.   Neck: No stridor. Painless ROM.  Cardiovascular: Normal rate, regular rhythm. Grossly normal heart sounds.  Good  peripheral circulation. Respiratory: Normal respiratory effort.  No retractions. Lungs CTAB. Gastrointestinal: Soft and nontender. No distention. No abdominal bruits. No CVA tenderness. Genitourinary: deferred Musculoskeletal: No lower extremity tenderness nor edema.  No joint effusions. Neurologic:   No gross focal neurologic deficits are appreciated. No facial droop Skin:  Skin is cool, dry and intact. No rash noted. Psychiatric: unable to assess 2/2 acuity.  ____________________________________________   LABS (all labs ordered are listed, but only abnormal results are displayed)  Results for orders placed or performed during the hospital encounter of 05/07/20 (from the past 24 hour(s))  CBC with Differential/Platelet     Status: Abnormal   Collection Time: 05/07/20  9:12 PM  Result Value Ref Range   WBC 5.1 4.0 - 10.5 K/uL   RBC 4.09 3.87 - 5.11 MIL/uL   Hemoglobin 11.9 (L) 12.0 - 15.0 g/dL   HCT 16.0 36 - 46 %   MCV 90.7 80.0 - 100.0 fL   MCH 29.1 26.0 - 34.0 pg   MCHC 32.1 30.0 - 36.0 g/dL   RDW 10.9 32.3 - 55.7 %   Platelets 258 150 - 400 K/uL   nRBC 0.0 0.0 - 0.2 %   Neutrophils Relative % 54 %   Neutro Abs 2.7 1.7 - 7.7 K/uL   Lymphocytes Relative 38 %   Lymphs Abs 1.9 0.7 - 4.0 K/uL   Monocytes Relative 8 %   Monocytes Absolute 0.4 0 - 1 K/uL   Eosinophils Relative 0 %   Eosinophils Absolute 0.0 0 - 0 K/uL  Basophils Relative 0 %   Basophils Absolute 0.0 0 - 0 K/uL   Immature Granulocytes 0 %   Abs Immature Granulocytes 0.01 0.00 - 0.07 K/uL  Comprehensive metabolic panel     Status: Abnormal   Collection Time: 05/07/20  9:12 PM  Result Value Ref Range   Sodium 139 135 - 145 mmol/L   Potassium 3.3 (L) 3.5 - 5.1 mmol/L   Chloride 103 98 - 111 mmol/L   CO2 26 22 - 32 mmol/L   Glucose, Bld 70 70 - 99 mg/dL   BUN 25 (H) 6 - 20 mg/dL   Creatinine, Ser 4.43 (H) 0.44 - 1.00 mg/dL   Calcium 9.0 8.9 - 15.4 mg/dL   Total Protein 7.0 6.5 - 8.1 g/dL   Albumin 3.8 3.5  - 5.0 g/dL   AST 27 15 - 41 U/L   ALT 33 0 - 44 U/L   Alkaline Phosphatase 105 38 - 126 U/L   Total Bilirubin 0.6 0.3 - 1.2 mg/dL   GFR calc non Af Amer 51 (L) >60 mL/min   GFR calc Af Amer 59 (L) >60 mL/min   Anion gap 10 5 - 15  Lipase, blood     Status: None   Collection Time: 05/07/20  9:12 PM  Result Value Ref Range   Lipase 32 11 - 51 U/L  Troponin I (High Sensitivity)     Status: None   Collection Time: 05/07/20  9:12 PM  Result Value Ref Range   Troponin I (High Sensitivity) 2 <18 ng/L  Lactic acid, plasma     Status: None   Collection Time: 05/07/20  9:13 PM  Result Value Ref Range   Lactic Acid, Venous 1.1 0.5 - 1.9 mmol/L  SARS Coronavirus 2 by RT PCR (hospital order, performed in [facility] hospital lab) Nasopharyngeal Nasopharyngeal Swab     Status: None   Collection Time: 05/07/20  9:21 PM   Specimen: Nasopharyngeal Swab  Result Value Ref Range   SARS Coronavirus 2 NEGATIVE NEGATIVE  Glucose, capillary     Status: None   Collection Time: 05/07/20  9:21 PM  Result Value Ref Range   Glucose-Capillary 80 70 - 99 mg/dL   ____________________________________________  EKG My review and personal interpretation at Time: 21:15   Indication: syncope  Rate: 65  Rhythm: sinus Axis: normal Other: normal intervals, no stemi ____________________________________________  RADIOLOGY  I personally reviewed all radiographic images ordered to evaluate for the above acute complaints and reviewed radiology reports and findings.  These findings were personally discussed with the patient.  Please see medical record for radiology report.  ____________________________________________   PROCEDURES  Procedure(s) performed:  .Critical Care Performed by: Willy Eddy, MD Authorized by: Willy Eddy, MD   Critical care provider statement:    Critical care time (minutes):  35   Critical care time was exclusive of:  Separately billable procedures and treating other  patients   Critical care was necessary to treat or prevent imminent or life-threatening deterioration of the following conditions:  Circulatory failure   Critical care was time spent personally by me on the following activities:  Development of treatment plan with patient or surrogate, discussions with consultants, evaluation of patient's response to treatment, examination of patient, obtaining history from patient or surrogate, ordering and performing treatments and interventions, ordering and review of laboratory studies, ordering and review of radiographic studies, pulse oximetry, re-evaluation of patient's condition and review of old charts     Due to difficulty with  obtaining IV access, a 20G peripheral IV catheter was inserted using US guidance into the RUE.  The site was prepped with chlorhexidine and allowed to dry.  The patient tolerated the procedure without any complications.     Critical Care performed: yes ____________________________________________   INITIAL IMPRESSION / ASSESSMENT AND PLAN / ED COURSE  Pertinent labs & imaging results that were available during my care of the patient were reviewed by me and considered in my medical decision making (see chart for details).   DDX: Dehydration, anemia, shock, sepsis, ACS, PE, orthostasis, dysrhythmia  Amanda [name] is a 54 y.o. who presents to the ED with syncopal episode and hypotension as described above.  Also found to be hypoxic requiring supplemental oxygen.  Due to poor access ultrasound IV was obtained and patient was given IV fluid bolus with improvement in her systolic pressures as well as mentation.  Denies any melena or hematochezia.  No recent trauma.  No other report of any bleeding.  Denies taking any excess of blood pressure medications or other medication.  EKG without evidence of STEMI.  We will continue with IV fluid resuscitation her CBG is normal.  Will order chest x-ray blood work for the above  differential.  Clinical Course as of May 07 2308  Wynelle Link May 07, 2020  2247 Blood pressure improving after IV fluids.  Will complete resuscitation and placed on maintenance fluids.  Troponin negative.  Lactate normal.  No white count.  No fever.  Does not seem consistent with sepsis.  No signs of PE.  She is mildly hypoxic will give duo nebs given her history of COPD.  Will require admission for further medical management.  Clinically otherwise significantly improved and states that she is feeling better as well.  Stable for admission to the hospital.   [PR]    Clinical Course User Index [PR] Willy Eddy, MD    The patient was evaluated in Emergency Department today for the symptoms described in the history of present illness. He/she was evaluated in the context of the global COVID-19 pandemic, which necessitated consideration that the patient might be at risk for infection with the SARS-CoV-2 virus that causes COVID-19. Institutional protocols and algorithms that pertain to the evaluation of patients at risk for COVID-19 are in a state of rapid change based on information released by regulatory bodies including the CDC and federal and state organizations. These policies and algorithms were followed during the patient's care in the ED.  As part of my medical decision making, I reviewed the following data within the electronic MEDICAL RECORD NUMBER Nursing notes reviewed and incorporated, Labs reviewed, notes from prior ED visits and [place] Controlled Substance Database   ____________________________________________   FINAL CLINICAL IMPRESSION(S) / ED DIAGNOSES  Final diagnoses:  Dehydration  Syncope, unspecified syncope type      NEW MEDICATIONS STARTED DURING THIS VISIT:  New Prescriptions   No medications on file     Note:  This document was prepared using Dragon voice recognition software and may include unintentional dictation errors.    Willy Eddy, MD 05/07/20 [phone]

## 2020-05-07 NOTE — H&P (Signed)
History and Physical    Amanda [name] BPZ:[id number] DOB: 1965/10/29 DOA: 05/07/2020  PCP: Emogene Morgan, MD   Patient coming from: Home  Chief Complaint:  Near syncopal episode, chest pain.  HPI: Amanda [name] is a 54 y.o. female with medical history significant for congestive heart failure, chronic disease stage III, asthma, hypertension, diabetes mellitus type 2, fibromyalgia who presents to the hospital for evaluation of near syncopal event while at home.  Patient reports she has been moving for the last 2 days into a new house has been doing a lot of lifting and carrying of boxes.  She has been very active and she states she has not eaten or drink very much over the last 2 days while moving.  She reports that she stood up and became lightheaded and dizzy and started to feel herself fall.  Her daughter grabbed hold of her and kept her from falling.  Reportedly she was very lethargic and not as responsive as she normally is.  EMS was called.  She states that she did have chest pain associated with shortness of breath over the last 2 days while she has been moving.  She reports the chest pain is a pressure sensation that does not radiate.  She rates the pain as a 5 out of 10 at its worst.  She states the chest pressure is currently resolved.  She states she has not had any nausea or vomiting and she does not remember becoming diaphoretic with the episode this afternoon.  She has not had any facial numbness or facial drooping and has not had any change in her vision or speech.  She denies any numbness of her extremities.  She does have chronic muscle pain with her fibromyalgia that is unchanged.  She states she has been using her breathing treatments at home for the shortness of breath the last few days.  States she has not been having edema of her legs and has not had any orthopnea.  Reports her last echocardiogram was 2 years ago and her last stress test was 12 to 18 months ago.  Has never had a cardiac  catheterization or stent placed. Patient did receive her first dose of the Materna Covid vaccination a few weeks ago.  She has not had any fever or chills.  She states she has not had any cough.  She denies any known exposures to Covid and is had no known sick contacts. She states she does not smoke drink alcohol or use illicit drugs.  ED Course: Ms. Mcglinn was found to be hypotensive when she arrived and was given IV fluid hydration with normal saline in the emergency room blood pressure has improved since receiving IV fluid boluses.  EKG was obtained and is normal.  An initial troponin is negative.  Review of Systems:  General: Reports generalized weakness and dizziness with standing.  Reports near syncopal event. Deneis fever, chills, weight loss, night sweats. Denies change in appetite HENT: Denies head trauma, headache, denies change in hearing, tinnitus.  Denies nasal congestion or bleeding.  Denies sore throat, sores in mouth.  Denies difficulty swallowing Eyes: Denies blurry vision, pain in eye, drainage.  Denies discoloration of eyes. Neck: Denies pain.  Denies swelling.  Denies pain with movement. Cardiovascular: Reports substernal chest pain.  Denies palpitations.  Denies edema.  Denies orthopnea Respiratory: Reports shortness of breath with no cough.  Reports intermittent wheezing.  Denies sputum production Gastrointestinal: Denies abdominal pain, swelling.  Denies nausea, vomiting, diarrhea.  Denies melena.  Denies hematemesis. Musculoskeletal: Denies limitation of movement.  Denies deformity or swelling. Reports chronic arthralgias and myalgias.  Denies acute pain. Genitourinary: Denies pelvic pain.  Denies urinary frequency or hesitancy.  Denies dysuria.  Skin: Denies rash.  Denies petechiae, purpura, ecchymosis. Neurological: Denies headache. Denies seizure activity.  Denies paresthesia.  Denies slurred speech, drooping face.  Denies visual change. Psychiatric: Denies depression,  anxiety.  Denies suicidal thoughts or ideation.  Denies hallucinations.  Past Medical History:  Diagnosis Date  . Asthma   . CHF (congestive heart failure) (HCC)   . Chronic kidney disease   . Diabetes mellitus without complication (HCC)   . Fibromyalgia   . Hypertension     Past Surgical History:  Procedure Laterality Date  . ABDOMINAL HYSTERECTOMY      Social History  reports that she has never smoked. She has never used smokeless tobacco. She reports previous alcohol use. She reports that she does not use drugs.  Allergies  Allergen Reactions  . Penicillins Anaphylaxis  . Ace Inhibitors Swelling    History reviewed. No pertinent family history.   Prior to Admission medications   Medication Sig Start Date End Date Taking? Authorizing Provider  cholecalciferol (VITAMIN D) 25 MCG (1000 UNIT) tablet Take 1,000 Units by mouth daily. 05/01/20   [provider]  DULoxetine (CYMBALTA) 30 MG capsule Take 30 mg by mouth daily. 04/12/20   [provider]  furosemide (LASIX) 20 MG tablet Take 20 mg by mouth daily. 04/12/20   [provider]  gabapentin (NEURONTIN) 300 MG capsule Take 300 mg by mouth 2 (two) times daily. 03/13/20   [provider]  LATUDA 40 MG TABS tablet Take 40 mg by mouth at bedtime. 04/12/20   [provider]  LINZESS 290 MCG CAPS capsule Take 290 mcg by mouth daily. 04/12/20   [provider]  losartan (COZAAR) 50 MG tablet Take 50 mg by mouth daily. 04/12/20   [provider]  mirtazapine (REMERON SOL-TAB) 30 MG disintegrating tablet Take 30 mg by mouth at bedtime. 04/12/20   [provider]  mirtazapine (REMERON) 15 MG tablet Take 15 mg by mouth at bedtime. 04/12/20   [provider]  montelukast (SINGULAIR) 10 MG tablet Take 10 mg by mouth daily. 04/12/20   [provider]  omeprazole (PRILOSEC) 40 MG capsule Take 40 mg by mouth daily. 04/12/20   [provider]  potassium  chloride SA (KLOR-CON) 20 MEQ tablet Take 20 mEq by mouth daily. 04/12/20   [provider]  pravastatin (PRAVACHOL) 40 MG tablet Take 40 mg by mouth daily. 04/12/20   [provider]  pregabalin (LYRICA) 150 MG capsule Take 150 mg by mouth 3 (three) times daily. 04/12/20   [provider]  propranolol ER (INDERAL LA) 80 MG 24 hr capsule Take 80 mg by mouth daily. 04/12/20   [provider]  tiZANidine (ZANAFLEX) 4 MG tablet Take 4 mg by mouth 3 (three) times daily. 04/12/20   [provider]  topiramate (TOPAMAX) 100 MG tablet Take 100 mg by mouth 2 (two) times daily. 04/12/20   [provider]  XENICAL 120 MG capsule Take 120 mg by mouth as directed. 05/01/20   [provider]  zolpidem (AMBIEN) 10 MG tablet Take 10 mg by mouth at bedtime as needed. 04/18/20   [provider]    Physical Exam: Vitals:   05/07/20 2205 05/07/20 2210 05/07/20 2245 05/07/20 2300  BP: (!) 84/52 [name] Kitchen)   Pulse: 62 60 63 (!) 59  Resp: Temp:      TempSrc:      SpO2: 100% 100% 100% 100%  Weight:      Height:        Constitutional: NAD, calm, comfortable Vitals:   05/07/20 2205 05/07/20 2210 05/07/20 2245 05/07/20 2300  BP: (!) 84/52 (!)  Pulse: 62 60 63 (!) 59  Resp: Temp:      TempSrc:      SpO2: 100% 100% 100% 100%  Weight:      Height:       General: WDWN, Alert and oriented x3.  Eyes: EOMI, PERRL, lids and conjunctivae normal.  Sclera nonicteric HENT:  Basin/AT, external ears normal.  Nares patent without epistasis.  Mucous membranes are moist. Posterior pharynx clear of any exudate or lesions. Neck: Soft, normal range of motion, supple, no masses, no thyromegaly.  Trachea midline Respiratory: Breath sounds are equal but mildly diminished.  She has mild expiratory wheezing.  No rhonchi no crackles. Normal respiratory effort. No accessory muscle use.  Cardiovascular: Regular rate  and rhythm, no murmurs / rubs / gallops. No extremity edema. 1+ pedal pulses.  Abdomen: Soft, no tenderness, nondistended, no rebound or guarding.  Obese.  No masses palpated. Bowel sounds normoactive Musculoskeletal: FROM. no clubbing / cyanosis. No joint deformity upper and lower extremities. Normal muscle tone.  Skin: Warm, dry, intact no rashes, lesions, ulcers. No induration Neurologic: CN 2-12 grossly intact.  Normal speech.  Sensation intact, Strength 5/5 in all extremities.   Psychiatric: Normal judgment and insight.  Normal mood.    Labs on Admission: I have personally reviewed following labs and imaging studies  CBC: Recent Labs  Lab 05/07/20 2112  WBC 5.1  NEUTROABS 2.7  HGB 11.9*  HCT 37.1  MCV 90.7  PLT 258    Basic Metabolic Panel: Recent Labs  Lab 05/07/20 2112  NA 139  K 3.3*  CL 103  CO2 26  GLUCOSE 70  BUN 25*  CREATININE 1.20*  CALCIUM 9.0    GFR: Estimated Creatinine Clearance: 60.9 mL/min (A) (by C-G formula based on SCr of 1.2 mg/dL (H)).  Liver Function Tests: Recent Labs  Lab 05/07/20 2112  AST 27  ALT 33  ALKPHOS 105  BILITOT 0.6  PROT 7.0  ALBUMIN 3.8    Urine analysis: No results found for: COLORURINE, APPEARANCEUR, LABSPEC, PHURINE, GLUCOSEU, HGBUR, BILIRUBINUR, KETONESUR, PROTEINUR, UROBILINOGEN, NITRITE, LEUKOCYTESUR  Radiological Exams on Admission: CT Angio Chest PE W and/or Wo Contrast  Result Date: 05/07/2020 CLINICAL DATA:  Syncope EXAM: CT ANGIOGRAPHY CHEST WITH CONTRAST TECHNIQUE: Multidetector CT imaging of the chest was performed using the standard protocol during bolus administration of intravenous contrast. Multiplanar CT image reconstructions and MIPs were obtained to evaluate the vascular anatomy. CONTRAST:  OMNIPAQUE IOHEXOL 350 MG/ML SOLN COMPARISON:  None. FINDINGS: Cardiovascular: No filling defects in the pulmonary arteries to suggest pulmonary emboli. Cardiomegaly. Aorta normal caliber. Mediastinum/Nodes:  No mediastinal, hilar, or axillary adenopathy. Trachea and esophagus are unremarkable. Thyroid unremarkable. Lungs/Pleura: Dependent atelectasis. No confluent opacities or effusions. Upper Abdomen: Imaging into the upper abdomen demonstrates no acute findings. Musculoskeletal: Chest wall soft tissues are unremarkable. No acute bony abnormality. Review of the MIP images confirms the above findings. IMPRESSION: No evidence of pulmonary embolus. Dependent atelectasis. Mild cardiomegaly. No acute cardiopulmonary disease. Electronically Signed   By: Charlett Nose M.D.  On: 05/07/2020 22:41   DG Chest Portable 1 View  Result Date: 05/07/2020 CLINICAL DATA:  Weakness EXAM: PORTABLE CHEST 1 VIEW COMPARISON:  None. FINDINGS: Cardiomegaly. Low lung volumes. No confluent airspace opacities, effusions or edema. No acute bony abnormality. IMPRESSION: Cardiomegaly, low lung volumes.  No active disease. Electronically Signed   By: Charlett NoseKevin  Dover M.D.   On: 05/07/2020 21:36    EKG: Independently reviewed.  EKG is reviewed and shows normal sinus rhythm with nonspecific ST changes.  QTc 450  Assessment/Plan Principal Problem:   Chest pain Patient replaced on cardiac telemetry for observation for chest pain.  Obtain serial troponin levels.  If troponins remain negative we will proceed with stress test.  If troponins become positive will consult cardiology.  Antiplatelet therapy with aspirin daily.  Check lipid panel.  Continue statin therapy.  Monitor blood pressure.  Nitroglycerin as needed.  Pulmonal oxygen as needed to maintain O2 sat between 92-96%.   Active Problems:   Hypotension Hold antihypertensives overnight.  Patient was given IV fluid boluses of 2500 ml NS in the emergency room and blood pressure has improved.  We will continue to monitor blood pressure.  Do not want to fluid overload patient with her history of congestive heart failure.  If BNP is negative ambulation remains soft then will give a 500 mL  bolus of LR    Near syncope Patient with near syncopal episode at home most likely secondary to decreased p.o. intake over the last 2 days while she has been doing a lot of activity with moving to a new home.    Type 2 diabetes mellitus without complication (HCC) Patient reports she had a hemoglobin A1c done last week and it was 9.6.  Home regimen of diabetes mellitus therapy will be verified and reconciled by pharmacy and then resumed.   Monitor blood sugars qac,qhs and provide SSI as needed for glycemic control overnight    Asthma Patient reports she is on Breo and Spiriva at home.  Is not listed on her medication list at this time.  Pharmacy is to obtain up-to-date medication list and reconcile so as medications can be resumed Patient be given DuoNeb's as needed for shortness of breath, cough, wheeze    Chronic congestive heart failure (HCC) Obtain echocardiogram in am to evaluate wall motion, valvular function and EF.  Continue home dose of Lasix and potassium.  BNP is pending     DVT prophylaxis: Patient placed on Lovenox for DVT prophylaxis Code Status:   Full code Family Communication:  Diagnosis and plans discussed with patient.  Patient verbalized understanding and agrees with plan.  Further recommendation to follow as clinical indicated Disposition Plan:   Patient is from:  Home  Anticipated DC to:  Home  Anticipated DC date:  Anticipate less than 2 midnight stay in the hospital to treat acute medical condition  Anticipated DC barriers: No barriers to discharge identified at this time   Admission status:  Observation  Severity of Illness: The appropriate patient status for this patient is OBSERVATION. Observation status is judged to be reasonable and necessary in order to provide the required intensity of service to ensure the patient's safety. The patient's presenting symptoms, physical exam findings, and initial radiographic and laboratory data in the context of their  medical condition is felt to place them at decreased risk for further clinical deterioration. Furthermore, it is anticipated that the patient will be medically stable for discharge from the hospital within 2 midnights of admission. The  following factors support the patient status of observation.     Claudean Severance Maico Mulvehill MD [facility]  How to contact the [facility] Attending or Consulting provider 7A - 7P or covering provider during after hours 7P -7A, for this patient?   1. Check the care team in [facility] and look for a) attending/consulting [facility] provider listed and b) the [facility] team listed 2. Log into [email or web address] and use [place]'s universal password to access. If you do not have the password, please contact the hospital operator. 3. Locate the [facility] provider you are looking for under [facility] and page to a number that you can be directly reached. 4. If you still have difficulty reaching the provider, please page the [facility] (Director on Call) for the Hospitalists listed on amion for assistance.  05/07/2020, 11:28 PM

## 2020-05-07 NOTE — ED Triage Notes (Signed)
Pt via EMS from home. Per EMS, pt had a syncopal episode at around 8:40 this evening. Daughter states that she caught her before she hit the ground. EMS reported a pressure systolic in the 60s. On arrival, pt is A&Ox4 but lethargic and fatigued. EMS got a CBG of 70. Denies pain but said she may have had some CP. Dr. Roxan Hockey, MD at bedside

## 2020-05-08 ENCOUNTER — Observation Stay: Admit: 2020-05-08 | Payer: Medicare HMO

## 2020-05-08 DIAGNOSIS — E119 Type 2 diabetes mellitus without complications: Secondary | ICD-10-CM | POA: Diagnosis not present

## 2020-05-08 DIAGNOSIS — E860 Dehydration: Secondary | ICD-10-CM | POA: Diagnosis not present

## 2020-05-08 DIAGNOSIS — Z794 Long term (current) use of insulin: Secondary | ICD-10-CM | POA: Diagnosis not present

## 2020-05-08 DIAGNOSIS — R079 Chest pain, unspecified: Secondary | ICD-10-CM

## 2020-05-08 DIAGNOSIS — I5022 Chronic systolic (congestive) heart failure: Secondary | ICD-10-CM

## 2020-05-08 DIAGNOSIS — E861 Hypovolemia: Secondary | ICD-10-CM

## 2020-05-08 DIAGNOSIS — J4540 Moderate persistent asthma, uncomplicated: Secondary | ICD-10-CM | POA: Diagnosis not present

## 2020-05-08 DIAGNOSIS — R55 Syncope and collapse: Secondary | ICD-10-CM

## 2020-05-08 DIAGNOSIS — I9589 Other hypotension: Secondary | ICD-10-CM | POA: Diagnosis not present

## 2020-05-08 LAB — LIPID PANEL
Cholesterol: 154 mg/dL (ref 0–200)
HDL: 57 mg/dL (ref >40)
LDL Cholesterol: 76 mg/dL (ref 0–99)
Total CHOL/HDL Ratio: 2.7 RATIO
Triglycerides: 105 mg/dL (ref <150)
VLDL: 21 mg/dL (ref 0–40)

## 2020-05-08 LAB — GLUCOSE, CAPILLARY
Glucose-Capillary: 53 mg/dL — ABNORMAL LOW (ref 70–99)
Glucose-Capillary: 55 mg/dL — ABNORMAL LOW (ref 70–99)
Glucose-Capillary: 224 mg/dL — ABNORMAL HIGH (ref 70–99)
Glucose-Capillary: 169 mg/dL — ABNORMAL HIGH (ref 70–99)
Glucose-Capillary: 82 mg/dL (ref 70–99)
Glucose-Capillary: 69 mg/dL — ABNORMAL LOW (ref 70–99)
Glucose-Capillary: 84 mg/dL (ref 70–99)
Glucose-Capillary: 103 mg/dL — ABNORMAL HIGH (ref 70–99)
Glucose-Capillary: 194 mg/dL — ABNORMAL HIGH (ref 70–99)
Glucose-Capillary: 87 mg/dL (ref 70–99)
Glucose-Capillary: 207 mg/dL — ABNORMAL HIGH (ref 70–99)
Glucose-Capillary: 170 mg/dL — ABNORMAL HIGH (ref 70–99)

## 2020-05-08 LAB — CREATININE, SERUM
Creatinine, Ser: 0.98 mg/dL (ref 0.44–1.00)
GFR calc Af Amer: >60 mL/min (ref >60)
GFR calc non Af Amer: >60 mL/min (ref >60)

## 2020-05-08 LAB — CBC
HCT: 35.8 % — ABNORMAL LOW (ref 36.0–46.0)
Hemoglobin: 11.5 g/dL — ABNORMAL LOW (ref 12.0–15.0)
MCH: 29.3 pg (ref 26.0–34.0)
MCHC: 32.1 g/dL (ref 30.0–36.0)
MCV: 91.3 fL (ref 80.0–100.0)
Platelets: 248 10*3/uL (ref 150–400)
RBC: 3.92 MIL/uL (ref 3.87–5.11)
RDW: 15.2 % (ref 11.5–15.5)
WBC: 5.1 10*3/uL (ref 4.0–10.5)
nRBC: 0 % (ref 0.0–0.2)

## 2020-05-08 LAB — HEMOGLOBIN A1C
Hgb A1c MFr Bld: 8.4 % — ABNORMAL HIGH (ref 4.8–5.6)
Mean Plasma Glucose: 194.38 mg/dL

## 2020-05-08 LAB — HIV ANTIBODY (ROUTINE TESTING W REFLEX): HIV Screen 4th Generation wRfx: NONREACTIVE

## 2020-05-08 LAB — TROPONIN I (HIGH SENSITIVITY): Troponin I (High Sensitivity): <2 ng/L (ref <18)

## 2020-05-08 LAB — TSH: TSH: 0.395 u[IU]/mL (ref 0.350–4.500)

## 2020-05-08 MED ORDER — IPRATROPIUM-ALBUTEROL 0.5-2.5 (3) MG/3ML IN SOLN: 3.0000 mL | RESPIRATORY_TRACT | Status: DC | PRN

## 2020-05-08 MED ORDER — POTASSIUM CHLORIDE CRYS ER 20 MEQ PO TBCR
20.0000 meq | EXTENDED_RELEASE_TABLET | Freq: Every day | ORAL | Status: DC
  Administered 2020-05-08 – 2020-05-09 (×2): 20 meq via ORAL
  Filled 2020-05-08 (×2): qty 1

## 2020-05-08 MED ORDER — PROPRANOLOL HCL ER 80 MG PO CP24
80.0000 mg | ORAL_CAPSULE | Freq: Every day | ORAL | Status: DC
  Administered 2020-05-08 – 2020-05-09 (×2): 80 mg via ORAL
  Filled 2020-05-08 (×2): qty 1

## 2020-05-08 MED ORDER — FUROSEMIDE 20 MG PO TABS
20.0000 mg | ORAL_TABLET | Freq: Every day | ORAL | Status: DC
  Administered 2020-05-08 – 2020-05-09 (×2): 20 mg via ORAL
  Filled 2020-05-08 (×2): qty 1

## 2020-05-08 MED ORDER — ACETAMINOPHEN 325 MG PO TABS: 650.0000 mg | ORAL_TABLET | ORAL | Status: DC | PRN

## 2020-05-08 MED ORDER — DEXTROSE 50 % IV SOLN
INTRAVENOUS | Status: AC
  Administered 2020-05-08: 50 mL via INTRAVENOUS
  Filled 2020-05-08: qty 50

## 2020-05-08 MED ORDER — MIRTAZAPINE 15 MG PO TABS
15.0000 mg | ORAL_TABLET | Freq: Every day | ORAL | Status: DC
  Administered 2020-05-08 (×2): 15 mg via ORAL
  Filled 2020-05-08 (×2): qty 1

## 2020-05-08 MED ORDER — PANTOPRAZOLE SODIUM 40 MG PO TBEC
40.0000 mg | DELAYED_RELEASE_TABLET | Freq: Every day | ORAL | Status: DC
  Administered 2020-05-08 – 2020-05-09 (×2): 40 mg via ORAL
  Filled 2020-05-08 (×2): qty 1

## 2020-05-08 MED ORDER — MONTELUKAST SODIUM 10 MG PO TABS
10.0000 mg | ORAL_TABLET | Freq: Every day | ORAL | Status: DC
  Administered 2020-05-08: 10 mg via ORAL
  Filled 2020-05-08 (×2): qty 1

## 2020-05-08 MED ORDER — ASPIRIN EC 81 MG PO TBEC
81.0000 mg | DELAYED_RELEASE_TABLET | Freq: Every day | ORAL | Status: DC
  Administered 2020-05-08 – 2020-05-09 (×2): 81 mg via ORAL
  Filled 2020-05-08 (×2): qty 1

## 2020-05-08 MED ORDER — INSULIN ASPART 100 UNIT/ML ~~LOC~~ SOLN: 0.0000 [IU] | Freq: Every day | SUBCUTANEOUS | Status: DC

## 2020-05-08 MED ORDER — FERROUS SULFATE 220 (44 FE) MG/5ML PO ELIX
220.0000 mg | ORAL_SOLUTION | Freq: Two times a day (BID) | ORAL | Status: DC
  Administered 2020-05-09: 220 mg via ORAL
  Filled 2020-05-08 (×4): qty 5

## 2020-05-08 MED ORDER — ENOXAPARIN SODIUM 40 MG/0.4ML ~~LOC~~ SOLN
40.0000 mg | SUBCUTANEOUS | Status: DC
  Administered 2020-05-08 – 2020-05-09 (×2): 40 mg via SUBCUTANEOUS
  Filled 2020-05-08 (×2): qty 0.4

## 2020-05-08 MED ORDER — DULOXETINE HCL 30 MG PO CPEP
30.0000 mg | ORAL_CAPSULE | Freq: Every day | ORAL | Status: DC
  Administered 2020-05-08 – 2020-05-09 (×2): 30 mg via ORAL
  Filled 2020-05-08 (×2): qty 1

## 2020-05-08 MED ORDER — ONDANSETRON HCL 4 MG/2ML IJ SOLN: 4.0000 mg | Freq: Four times a day (QID) | INTRAMUSCULAR | Status: DC | PRN

## 2020-05-08 MED ORDER — VITAMIN D 25 MCG (1000 UNIT) PO TABS
1000.0000 [IU] | ORAL_TABLET | Freq: Every day | ORAL | Status: DC
  Administered 2020-05-08 – 2020-05-09 (×2): 1000 [IU] via ORAL
  Filled 2020-05-08 (×2): qty 1

## 2020-05-08 MED ORDER — LURASIDONE HCL 40 MG PO TABS
40.0000 mg | ORAL_TABLET | Freq: Every day | ORAL | Status: DC
  Filled 2020-05-08 (×3): qty 1

## 2020-05-08 MED ORDER — PRAVASTATIN SODIUM 40 MG PO TABS
40.0000 mg | ORAL_TABLET | Freq: Every day | ORAL | Status: DC
  Administered 2020-05-08: 40 mg via ORAL
  Filled 2020-05-08 (×2): qty 1

## 2020-05-08 MED ORDER — LINACLOTIDE 290 MCG PO CAPS
290.0000 ug | ORAL_CAPSULE | Freq: Every day | ORAL | Status: DC
  Administered 2020-05-08 – 2020-05-09 (×2): 290 ug via ORAL
  Filled 2020-05-08 (×2): qty 1

## 2020-05-08 MED ORDER — INSULIN ASPART 100 UNIT/ML ~~LOC~~ SOLN
0.0000 [IU] | Freq: Three times a day (TID) | SUBCUTANEOUS | Status: DC
  Administered 2020-05-08: 5 [IU] via SUBCUTANEOUS
  Administered 2020-05-09: 2 [IU] via SUBCUTANEOUS
  Filled 2020-05-08: qty 1

## 2020-05-08 MED ORDER — ALBUTEROL SULFATE (2.5 MG/3ML) 0.083% IN NEBU: 3.0000 mL | INHALATION_SOLUTION | RESPIRATORY_TRACT | Status: DC | PRN

## 2020-05-08 MED ORDER — TOPIRAMATE 100 MG PO TABS
100.0000 mg | ORAL_TABLET | Freq: Two times a day (BID) | ORAL | Status: DC
  Administered 2020-05-08 – 2020-05-09 (×3): 100 mg via ORAL
  Filled 2020-05-08: qty 1
  Filled 2020-05-08: qty 4
  Filled 2020-05-08 (×2): qty 1

## 2020-05-08 MED ORDER — PREGABALIN 75 MG PO CAPS
150.0000 mg | ORAL_CAPSULE | Freq: Three times a day (TID) | ORAL | Status: DC
  Administered 2020-05-08 – 2020-05-09 (×5): 150 mg via ORAL
  Filled 2020-05-08 (×5): qty 2

## 2020-05-08 MED ORDER — DEXTROSE 50 % IV SOLN: 1.0000 | Freq: Once | INTRAVENOUS | Status: AC

## 2020-05-08 NOTE — ED Notes (Signed)
Patient given apple juice per hypoglycemic protocol.

## 2020-05-08 NOTE — ED Notes (Signed)
Patient's POCT CBG 55. Meal given. Admitting provider informed. Awaiting response.

## 2020-05-08 NOTE — ED Notes (Signed)
Admitting provider ordered RN to follow hypoglycemia protocol. Per protocol Patient's POCT CBG rechecked; now 53. Per protocol, patient given 8 ounces of apple juice. RN will recheck 15 minutes after patient has finished drinking. Provider informed of patient's current CBG level. Awaiting provider response.

## 2020-05-08 NOTE — ED Notes (Signed)
Pt given breakfast tray

## 2020-05-08 NOTE — Progress Notes (Signed)
PROGRESS NOTE    Patient: Amanda [name]                            PCP: Emogene MorganAycock, Ngwe A, MD                    DOB: 1965-10-21            DOA: 05/07/2020 [id number]             DOS: 05/08/2020, 7:28 AM   LOS: 0 days   Date of Service: The patient was seen and examined on 05/08/2020  Subjective:   The patient was seen and examined this Am. Stable, blood pressure is improved, denies any chest pain or shortness of breath Was hypoglycemic this a.m., patient was fed, blood sugar has stabilized  Since patient has had breakfast, stress cardiac test would not be done today--- likely in a.m.  Brief Narrative:   HPI: Amanda [name] is a 54 y.o. female with medical history significant for congestive heart failure, chronic disease stage III, asthma, hypertension, diabetes mellitus type 2, fibromyalgia who presents to the hospital for evaluation of near syncopal event while at home, atypical chest pain.   She reports that she stood up and became lightheaded and dizzy and started to feel herself fall.  Her daughter grabbed hold of her and kept her from falling.  Reportedly she was very lethargic and not as responsive as she normally is.     Reports her last echocardiogram was 2 years ago and her last stress test was 12 to 18 months ago.  Has never had a cardiac catheterization or stent placed.  Patient did receive her first dose of the [employer] Covid vaccination a few weeks ago.      ED Course: She was found hypotensive when she arrived and was given IV fluid hydration with normal saline in the emergency room blood pressure has improved since receiving IV fluid boluses.  EKG was obtained and is normal.  An initial troponin is negative.   Assessment & Plan:   Principal Problem:   Chest pain Active Problems:   Hypotension   Near syncope   Type 2 diabetes mellitus without complication (HCC)   Asthma   Chronic congestive heart failure (HCC)  Chest pain -We will continue monitor on cardiac  telemetry -Troponin negative, will keep the patient n.p.o. after midnight Anticipating myocardial perfusion stress test in a.m.   Antiplatelet therapy with aspirin daily.   Check lipid panel.  Continue statin therapy.  Monitor blood pressure.  Nitroglycerin as needed.  Pulmonal oxygen as needed to maintain O2 sat between 92-96%.   Labile blood pressure /hypertensive-history of hypertension -Blood pressure is improved, currently hypotensive now..  -Resuming Lasix, holding losartan  Patient was given IV fluid boluses of 2500 ml NS in the emergency room and blood pressure has improved.  We will continue to monitor blood pressure.  Do not want to fluid overload patient with her history of congestive heart failure.  If BNP is negative ambulation remains soft then will give a 500 mL bolus of LR    Near syncope -Patient with near syncopal episode at home most likely secondary to decreased p.o. intake over the last 2 days while she has been doing a lot of activity with moving to a new home.... Also hypotension - CTA >> reviewed-negative for any acute abnormalities, negative for pulmonary embolism      Type 2 diabetes mellitus without  complication (HCC) Hypoglycemic-monitoring closely, Patient blood sugar was as low as 69 this morning, has improved currently 194 -Patient has had breakfast - hemoglobin A1c done last week and it was 9.6. A1c: 8.4   - Home meds: Jardiance/Victoza -Monitor blood sugar QA CHS, SSI coverage   H/o  Asthma No signs of exacerbation shortness of breath or wheezing -We will continue home inhalers including ProAir -As needed DuoNeb bronchodilators     Chronic congestive heart failure (HCC) No signs of exacerbation-monitoring closely Continue home dose of Lasix and potassium.  Monitoring daily weights, I's and O's, labs BNP Follows with congestive heart failure clinic as an outpatient  Chronic iron deficiency anemia -Continue iron supplements  GERD We  will continue PPI  Depression Continue home medication of Latuda, Remeron, Cymbalta,  Hyperlipidemia -We will resume pravastatin   DVT prophylaxis:      Patient placed on Lovenox for DVT prophylaxis Code Status:              Full code Family Communication:       Diagnosis and plans discussed with patient.  Patient verbalized understanding and agrees with plan.  Further recommendation to follow as clinical indicated Disposition Plan:              Patient is from:                        Home             Anticipated DC to:                   Home             Anticipated DC date:               Anticipate less than 2 midnight stay in the hospital to treat acute medical condition             Anticipated DC barriers:         No barriers to discharge identified at this time   Admission status:     Observation       Consultants: None   ------------------------------------------------------------------------------------------------------------------------------------------------  DVT prophylaxis:  SCD/Compression stockings Code Status:   Code Status: Full Code Family Communication: No family member present at bedside- attempt will be made to update daily The above findings and plan of care has been discussed with patient (and family )  in detail,  they expressed understanding and agreement of above. -Advance care planning has been discussed.   Admission status:    Status is: Observation  The patient remains OBS appropriate and will d/c before 2 midnights.  Dispo: The patient is from: Home              Anticipated d/c is to: Home              Anticipated d/c date is: 2 days              Patient currently is not medically stable to d/c.        Procedures:   No admission procedures for hospital encounter.     Antimicrobials:  Anti-infectives (From admission, onward)   None       Medication:  . aspirin EC  81 mg Oral Daily  . DULoxetine  30 mg Oral Daily  .  enoxaparin (LOVENOX) injection  40 mg Subcutaneous Q24H  . furosemide  20 mg Oral Daily  . insulin aspart  0-15  Units Subcutaneous TID WC  . insulin aspart  0-5 Units Subcutaneous QHS  . lurasidone  40 mg Oral QHS  . mirtazapine  15 mg Oral QHS  . montelukast  10 mg Oral QHS  . potassium chloride SA  20 mEq Oral Daily  . pravastatin  40 mg Oral QHS    acetaminophen, ipratropium-albuterol, ondansetron (ZOFRAN) IV   Objective:   Vitals:   05/08/20 0500 05/08/20 0515 05/08/20 0530 05/08/20 0700  BP: 95/61 122/77 115/79 134/81  Pulse: 66 71 93 (!) 58  Resp:    16  Temp:      TempSrc:      SpO2: 95% 98% 93% 100%  Weight:      Height:        Intake/Output Summary (Last 24 hours) at 05/08/2020 2505 Last data filed at 05/07/2020 2245 Gross per 24 hour  Intake 2000 ml  Output --  Net 2000 ml   Filed Weights   05/07/20 2110  Weight: 96.6 kg     Examination:   Physical Exam  Constitution:  Alert, cooperative, no distress,  Appears calm and comfortable  Psychiatric: Normal and stable mood and affect, cognition intact,   HEENT: Normocephalic, PERRL, otherwise with in Normal limits  Chest:Chest symmetric Cardio vascular:  S1/S2, RRR, No murmure, No Rubs or Gallops  pulmonary: Clear to auscultation bilaterally, respirations unlabored, negative wheezes / crackles Abdomen: Soft, non-tender, non-distended, bowel sounds,no masses, no organomegaly Muscular skeletal: Limited exam - in bed, able to move all 4 extremities, Normal strength,  Neuro: CNII-XII intact. , normal motor and sensation, reflexes intact  Extremities: No pitting edema lower extremities, +2 pulses  Skin: Dry, warm to touch, negative for any Rashes, No open wounds Wounds: per nursing documentation    ------------------------------------------------------------------------------------------------------------------------------------------    LABs:  CBC Latest Ref Rng & Units 05/08/2020 05/07/2020  WBC 4.0 - 10.5 K/uL  5.1 5.1  Hemoglobin 12.0 - 15.0 g/dL 11.5(L) 11.9(L)  Hematocrit 36 - 46 % 35.8(L) 37.1  Platelets 150 - 400 K/uL 248 258   CMP Latest Ref Rng & Units 05/08/2020 05/07/2020  Glucose 70 - 99 mg/dL - 70  BUN 6 - 20 mg/dL - 39(J)  Creatinine 6.73 - 1.00 mg/dL 4.19 3.79(K)  Sodium 240 - 145 mmol/L - 139  Potassium 3.5 - 5.1 mmol/L - 3.3(L)  Chloride 98 - 111 mmol/L - 103  CO2 22 - 32 mmol/L - 26  Calcium 8.9 - 10.3 mg/dL - 9.0  Total Protein 6.5 - 8.1 g/dL - 7.0  Total Bilirubin 0.3 - 1.2 mg/dL - 0.6  Alkaline Phos 38 - 126 U/L - 105  AST 15 - 41 U/L - 27  ALT 0 - 44 U/L - 33       Micro Results Recent Results (from the past 240 hour(s))  SARS Coronavirus 2 by RT PCR (hospital order, performed in [facility] hospital lab) Nasopharyngeal Nasopharyngeal Swab     Status: None   Collection Time: 05/07/20  9:21 PM   Specimen: Nasopharyngeal Swab  Result Value Ref Range Status   SARS Coronavirus 2 NEGATIVE NEGATIVE Final    Comment: (NOTE) SARS-CoV-2 target nucleic acids are NOT DETECTED.  The SARS-CoV-2 RNA is generally detectable in upper and lower respiratory specimens during the acute phase of infection. The lowest concentration of SARS-CoV-2 viral copies this assay can detect is 250 copies / mL. A negative result does not preclude SARS-CoV-2 infection and should not be used as the sole basis for treatment or  other patient management decisions.  A negative result may occur with improper specimen collection / handling, submission of specimen other than nasopharyngeal swab, presence of viral mutation(s) within the areas targeted by this assay, and inadequate number of viral copies (<250 copies / mL). A negative result must be combined with clinical observations, patient history, and epidemiological information.  Fact Sheet for Patients:   BoilerBrush.com.cy  Fact Sheet for Healthcare Providers: https://pope.com/  This test is not  yet approved or  cleared by the [place] FDA and has been authorized for detection and/or diagnosis of SARS-CoV-2 by FDA under an Emergency Use Authorization (EUA).  This EUA will remain in effect (meaning this test can be used) for the duration of the COVID-19 declaration under Section 564(b)(1) of the Act, 21 U.S.C. section 360bbb-3(b)(1), unless the authorization is terminated or revoked sooner.  Performed at [facility], [address] [place], [place] [address]     Radiology Reports CT Angio Chest PE W and/or Wo Contrast  Result Date: 05/07/2020 CLINICAL DATA:  Syncope EXAM: CT ANGIOGRAPHY CHEST WITH CONTRAST TECHNIQUE: Multidetector CT imaging of the chest was performed using the standard protocol during bolus administration of intravenous contrast. Multiplanar CT image reconstructions and MIPs were obtained to evaluate the vascular anatomy. CONTRAST:  OMNIPAQUE IOHEXOL 350 MG/ML SOLN COMPARISON:  None. FINDINGS: Cardiovascular: No filling defects in the pulmonary arteries to suggest pulmonary emboli. Cardiomegaly. Aorta normal caliber. Mediastinum/Nodes: No mediastinal, hilar, or axillary adenopathy. Trachea and esophagus are unremarkable. Thyroid unremarkable. Lungs/Pleura: Dependent atelectasis. No confluent opacities or effusions. Upper Abdomen: Imaging into the upper abdomen demonstrates no acute findings. Musculoskeletal: Chest wall soft tissues are unremarkable. No acute bony abnormality. Review of the MIP images confirms the above findings. IMPRESSION: No evidence of pulmonary embolus. Dependent atelectasis. Mild cardiomegaly. No acute cardiopulmonary disease. Electronically Signed   By: Charlett Nose M.D.   On: 05/07/2020 22:41   DG Chest Portable 1 View  Result Date: 05/07/2020 CLINICAL DATA:  Weakness EXAM: PORTABLE CHEST 1 VIEW COMPARISON:  None. FINDINGS: Cardiomegaly. Low lung volumes. No confluent airspace opacities, effusions or edema. No acute bony  abnormality. IMPRESSION: Cardiomegaly, low lung volumes.  No active disease. Electronically Signed   By: Charlett Nose M.D.   On: 05/07/2020 21:36    SIGNED: Kendell Bane, MD, FACP, FHM. [facility],  Pager (please use [email or web address] to page/text)  If 7PM-7AM, please contact night-coverage [email or web address], Password [facility] 05/08/2020, 7:28 AM

## 2020-05-08 NOTE — ED Notes (Signed)
Called and requested breakfast tray be sent now instead of with standard trays

## 2020-05-09 ENCOUNTER — Observation Stay: Payer: Medicare HMO

## 2020-05-09 ENCOUNTER — Observation Stay
Admit: 2020-05-09 | Discharge: 2020-05-09 | Disposition: A | Discharge status: Home / Self Care | Payer: Medicare HMO | Attending: Family Medicine | Admitting: Family Medicine

## 2020-05-09 DIAGNOSIS — J4540 Moderate persistent asthma, uncomplicated: Secondary | ICD-10-CM | POA: Diagnosis not present

## 2020-05-09 DIAGNOSIS — E861 Hypovolemia: Secondary | ICD-10-CM | POA: Diagnosis not present

## 2020-05-09 DIAGNOSIS — I200 Unstable angina: Secondary | ICD-10-CM | POA: Diagnosis not present

## 2020-05-09 DIAGNOSIS — R079 Chest pain, unspecified: Secondary | ICD-10-CM | POA: Diagnosis not present

## 2020-05-09 DIAGNOSIS — E860 Dehydration: Secondary | ICD-10-CM | POA: Diagnosis not present

## 2020-05-09 DIAGNOSIS — R55 Syncope and collapse: Secondary | ICD-10-CM | POA: Diagnosis not present

## 2020-05-09 DIAGNOSIS — I10 Essential (primary) hypertension: Secondary | ICD-10-CM | POA: Diagnosis not present

## 2020-05-09 DIAGNOSIS — E119 Type 2 diabetes mellitus without complications: Secondary | ICD-10-CM | POA: Diagnosis not present

## 2020-05-09 DIAGNOSIS — I9589 Other hypotension: Secondary | ICD-10-CM | POA: Diagnosis not present

## 2020-05-09 DIAGNOSIS — I5022 Chronic systolic (congestive) heart failure: Secondary | ICD-10-CM | POA: Diagnosis not present

## 2020-05-09 DIAGNOSIS — E785 Hyperlipidemia, unspecified: Secondary | ICD-10-CM | POA: Diagnosis not present

## 2020-05-09 DIAGNOSIS — Z794 Long term (current) use of insulin: Secondary | ICD-10-CM | POA: Diagnosis not present

## 2020-05-09 LAB — ECHOCARDIOGRAM COMPLETE
AR max vel: 3.05 cm2
AV Area VTI: 2.27 cm2
AV Area mean vel: 2.83 cm2
AV Mean grad: 2.5 mmHg
AV Peak grad: 4.3 mmHg
Ao pk vel: 1.04 m/s
Area-P 1/2: 3.42 cm2
Height: 65 in
S' Lateral: 2.79 cm
Weight: 3651.2 oz

## 2020-05-09 LAB — BASIC METABOLIC PANEL
Anion gap: 9 (ref 5–15)
BUN: 13 mg/dL (ref 6–20)
CO2: 22 mmol/L (ref 22–32)
Calcium: 9 mg/dL (ref 8.9–10.3)
Chloride: 108 mmol/L (ref 98–111)
Creatinine, Ser: 0.76 mg/dL (ref 0.44–1.00)
GFR calc Af Amer: >60 mL/min (ref >60)
GFR calc non Af Amer: >60 mL/min (ref >60)
Glucose, Bld: 176 mg/dL — ABNORMAL HIGH (ref 70–99)
Potassium: 4.3 mmol/L (ref 3.5–5.1)
Sodium: 139 mmol/L (ref 135–145)

## 2020-05-09 LAB — NM MYOCAR MULTI W/SPECT W/WALL MOTION / EF
Estimated workload: 1 METS
Exercise duration (min): 1 min
Exercise duration (sec): 7 s
LV dias vol: 64 mL (ref 46–106)
LV sys vol: 18 mL
Peak HR: 102 {beats}/min
Percent HR: 61 %
Rest HR: 61 {beats}/min
SDS: 4
SRS: 0
SSS: 4
TID: 1.05

## 2020-05-09 LAB — GLUCOSE, CAPILLARY: Glucose-Capillary: 128 mg/dL — ABNORMAL HIGH (ref 70–99)

## 2020-05-09 LAB — BRAIN NATRIURETIC PEPTIDE: B Natriuretic Peptide: 114.1 pg/mL — ABNORMAL HIGH (ref 0.0–100.0)

## 2020-05-09 MED ORDER — TECHNETIUM TC 99M TETROFOSMIN IV KIT
30.0000 | PACK | Freq: Once | INTRAVENOUS | Status: AC | PRN
  Administered 2020-05-09: 31.977 via INTRAVENOUS

## 2020-05-09 MED ORDER — LOSARTAN POTASSIUM 50 MG PO TABS: 25.0000 mg | ORAL_TABLET | Freq: Every day | ORAL | 2 refills | Status: DC

## 2020-05-09 MED ORDER — TECHNETIUM TC 99M TETROFOSMIN IV KIT
10.0000 | PACK | Freq: Once | INTRAVENOUS | Status: AC | PRN
  Administered 2020-05-09: 10.84 via INTRAVENOUS

## 2020-05-09 MED ORDER — REGADENOSON 0.4 MG/5ML IV SOLN
0.4000 mg | Freq: Once | INTRAVENOUS | Status: AC
  Administered 2020-05-09: 0.4 mg via INTRAVENOUS

## 2020-05-09 NOTE — Discharge Summary (Signed)
Physician Discharge Summary [facility]    Patient: Amanda [name]                   Admit date: 05/07/2020   DOB: [birth year]             Discharge date:05/09/2020/12:27 PM [id number]                          PCP: Emogene Morgan, MD  Disposition: HOME   Recommendations for Outpatient Follow-up:   . Follow up: in 1 week  Discharge Condition: Stable   Code Status:   Code Status: Full Code  Diet recommendation: Cardiac diet   Discharge Diagnoses:    Principal Problem:   Chest pain Active Problems:   Hypotension   Near syncope   Type 2 diabetes mellitus without complication (HCC)   Asthma   Chronic congestive heart failure (HCC)   History of Present Illness/ Hospital Course Charline Bills Summary:    VFI:EPPIRJ Pooleis a 54 y.o.femalewith medical history significant forcongestive heart failure, chronic disease stage III, asthma, hypertension, diabetes mellitus type 2, fibromyalgia who presents to the hospital for evaluation of near syncopal event while at home, atypical chest pain.  She reports that she stood up and became lightheaded and dizzy and started to feel herself fall. Her daughter grabbed hold of her and kept her from falling. Reportedly she was very lethargic and not as responsive as she normally is.   Reports her last echocardiogram was 2 years ago and her last stress test was 12 to 18 months ago. Has never had a cardiac catheterization or stent placed.  Patient did receive her first dose of the Materna Covid vaccination a few weeks ago.    ED Course: She was found hypotensive when she arrived and was given IV fluid hydration with normal saline in the emergency room blood pressure has improved since receiving IV fluid boluses. EKG was obtained and is normal. An initial troponin is negative.   Assessment & Plan:   Principal Problem:   Chest pain Active Problems:   Hypotension   Near syncope   Type 2 diabetes mellitus without  complication (HCC)   Asthma   Chronic congestive heart failure (HCC)  Chest pain --Denies any chest pain at this time denies any shortness of breath, no events overnigh  -NM myocardial perfusion stress test this morning 05/09/2020 Reviewed within normal limits: Negative for ischemic changes-negative for ischemic changes, ejection fraction 55-65%  -Echo reviewed ejection fraction 50-55% normal ventricular function    Antiplatelet therapy with aspirin daily. Nitroglycerin as needed. Pulmonal oxygen as needed to maintain O2 sat between 92-96%.  Labile blood pressure /hypertensive-history of hypertension -Blood pressure is improved, currently hypotensive now..  -Resuming Lasix, holding losartan Patient was given IV fluid boluses of 2500 ml NSin the emergency room and blood pressure has improved.  We will continue to monitor blood pressure. Do not want to fluid overload patient with her history of congestive heart failure. If BNP is negative ambulation remains soft then will give a 500 mL bolus of LR  Near syncope -Patient with near syncopal episode at home most likely secondary to decreased p.o. intake over the last 2 days while she has been doing a lot of activity with moving to a new home.... Also hypotension - CTA >> reviewed-negative for any acute abnormalities, negative for pulmonary embolism - work-up negative    Type 2 diabetes mellitus without complication (HCC) Hypoglycemic-monitoring closely... resolved  -  Patient has had breakfast - hemoglobin A1c done last week and it was 9.6. A1c: 8.4  - Home meds: Jardiance/Victoza  -Monitor blood sugar QA CHS, SSI coverage   H/o Asthma No signs of exacerbation shortness of breath or wheezing -We will continue home inhalers including ProAir -As needed DuoNeb bronchodilators   Chronic congestive heart failure (HCC) No signs of exacerbation-monitoring closely Continue home dose of Lasix and  potassium. Monitoring daily weights, I's and O's, labs BNP Follows with congestive heart failure clinic as an outpatient  Chronic iron deficiency anemia -Continue iron supplements  GERD We will continue PPI  Depression Continue home medication of Latuda, Remeron, Cymbalta,  Hyperlipidemia -We will resume pravastatin   s Code Status:Full code Family Communication:Diagnosis and plans discussed with patient. Patient verbalized understanding and agrees with plan. Further recommendation to follow as clinical indicated Disposition Plan: Patient is from:Home Anticipated DC HC:[facility]   Admission status:Observation      Discharge Instructions:   Discharge Instructions    Activity as tolerated - No restrictions   Complete by: As directed    Call MD for:  difficulty breathing, headache or visual disturbances   Complete by: As directed    Call MD for:  persistant dizziness or light-headedness   Complete by: As directed    Call MD for:  redness, tenderness, or signs of infection (pain, swelling, redness, odor or green/yellow discharge around incision site)   Complete by: As directed    Call MD for:  temperature >100.4   Complete by: As directed    Diet - low sodium heart healthy   Complete by: As directed    Discharge instructions   Complete by: As directed    Follow with PCP, cardiology within 1 to 2 weeks   Increase activity slowly   Complete by: As directed        Medication List    STOP taking these medications   tiZANidine 4 MG tablet Commonly known as: ZANAFLEX   zolpidem 10 MG tablet Commonly known as: AMBIEN     TAKE these medications   cholecalciferol 25 MCG (1000 UNIT) tablet Commonly known as: VITAMIN D Take 1,000 Units by mouth daily.   DULoxetine 30 MG capsule Commonly known as: CYMBALTA Take 30 mg by mouth daily.   ferrous sulfate 220 (44 Fe)  MG/5ML solution Take 220 mg by mouth 2 (two) times daily.   furosemide 20 MG tablet Commonly known as: LASIX Take 20 mg by mouth daily.   Jardiance 25 MG Tabs tablet Generic drug: empagliflozin Take 25 mg by mouth daily.   Latuda 40 MG Tabs tablet Generic drug: lurasidone Take 40 mg by mouth at bedtime.   Linzess 290 MCG Caps capsule Generic drug: linaclotide Take 290 mcg by mouth daily.   losartan 50 MG tablet Commonly known as: COZAAR Take 0.5 tablets (25 mg total) by mouth daily. What changed: how much to take   mirtazapine 15 MG tablet Commonly known as: REMERON Take 15 mg by mouth daily as needed.   montelukast 10 MG tablet Commonly known as: SINGULAIR Take 10 mg by mouth daily.   omeprazole 40 MG capsule Commonly known as: PRILOSEC Take 40 mg by mouth daily.   potassium chloride SA 20 MEQ tablet Commonly known as: KLOR-CON Take 20 mEq by mouth daily.   pravastatin 40 MG tablet Commonly known as: PRAVACHOL Take 40 mg by mouth daily.   pregabalin 150 MG capsule Commonly known as: LYRICA Take 150 mg by mouth 3 (  three) times daily.   ProAir RespiClick 108 (90 Base) MCG/ACT Aepb Generic drug: Albuterol Sulfate Inhale 2 puffs into the lungs every 4 (four) hours as needed.   propranolol ER 80 MG 24 hr capsule Commonly known as: INDERAL LA Take 80 mg by mouth daily.   topiramate 100 MG tablet Commonly known as: TOPAMAX Take 100 mg by mouth 2 (two) times daily.   Victoza 18 MG/3ML Sopn Generic drug: liraglutide Inject 3 mLs into the skin daily.   Xenical 120 MG capsule Generic drug: orlistat Take 120 mg by mouth as directed.       Allergies  Allergen Reactions  . Penicillins Anaphylaxis  . Ace Inhibitors Swelling     Procedures /Studies:   CT Angio Chest PE W and/or Wo Contrast  Result Date: 05/07/2020 CLINICAL DATA:  Syncope EXAM: CT ANGIOGRAPHY CHEST WITH CONTRAST TECHNIQUE: Multidetector CT imaging of the chest was performed using the  standard protocol during bolus administration of intravenous contrast. Multiplanar CT image reconstructions and MIPs were obtained to evaluate the vascular anatomy. CONTRAST:  100mL OMNIPAQUE IOHEXOL 350 MG/ML SOLN COMPARISON:  None. FINDINGS: Cardiovascular: No filling defects in the pulmonary arteries to suggest pulmonary emboli. Cardiomegaly. Aorta normal caliber. Mediastinum/Nodes: No mediastinal, hilar, or axillary adenopathy. Trachea and esophagus are unremarkable. Thyroid unremarkable. Lungs/Pleura: Dependent atelectasis. No confluent opacities or effusions. Upper Abdomen: Imaging into the upper abdomen demonstrates no acute findings. Musculoskeletal: Chest wall soft tissues are unremarkable. No acute bony abnormality. Review of the MIP images confirms the above findings. IMPRESSION: No evidence of pulmonary embolus. Dependent atelectasis. Mild cardiomegaly. No acute cardiopulmonary disease. Electronically Signed   By: Charlett NoseKevin  Dover M.D.   On: 05/07/2020 22:41   DG Chest Portable 1 View  Result Date: 05/07/2020 CLINICAL DATA:  Weakness EXAM: PORTABLE CHEST 1 VIEW COMPARISON:  None. FINDINGS: Cardiomegaly. Low lung volumes. No confluent airspace opacities, effusions or edema. No acute bony abnormality. IMPRESSION: Cardiomegaly, low lung volumes.  No active disease. Electronically Signed   By: Charlett NoseKevin  Dover M.D.   On: 05/07/2020 21:36     Subjective:   Patient was seen and examined 05/09/2020, 12:27 PM Patient stable today. No acute distress.  No issues overnight Stable for discharge.  Discharge Exam:    Vitals:   05/09/20 0453 05/09/20 0801 05/09/20 0935 05/09/20 1001  BP: 134/86 (!) 147/95    Pulse: 67 (!) 57 73 87  Resp: 20 20    Temp: 98.1 F (36.7 C) 98.7 F (37.1 C)    TempSrc: Oral Oral    SpO2: 100% 100% 100% 99%  Weight: 103.5 kg     Height:        General: Pt lying comfortably in bed & appears in no obvious distress. Cardiovascular: S1 & S2 heard, RRR, S1/S2 +. No murmurs,  rubs, gallops or clicks. No JVD or pedal edema. Respiratory: Clear to auscultation without wheezing, rhonchi or crackles. No increased work of breathing. Abdominal:  Non-distended, non-tender & soft. No organomegaly or masses appreciated. Normal bowel sounds heard. CNS: Alert and oriented. No focal deficits. Extremities: no edema, no cyanosis    The results of significant diagnostics from this hospitalization (including imaging, microbiology, ancillary and laboratory) are listed below for reference.      Microbiology:   Recent Results (from the past 240 hour(s))  SARS Coronavirus 2 by RT PCR (hospital order[facility] Nasopharyngeal Nasopharyngeal Swab     Status: None   Collection Time: 05/07/20  9:21 PM  Specimen: Nasopharyngeal Swab  Result Value Ref Range Status   SARS Coronavirus 2 NEGATIVE NEGATIVE Final    Comment: (NOTE) SARS-CoV-2 target nucleic acids are NOT DETECTED.  The SARS-CoV-2 RNA is generally detectable in upper and lower respiratory specimens during the acute phase of infection. The lowest concentration of SARS-CoV-2 viral copies this assay can detect is 250 copies / mL. A negative result does not preclude SARS-CoV-2 infection and should not be used as the sole basis for treatment or other patient management decisions.  A negative result may occur with improper specimen collection / handling, submission of specimen other than nasopharyngeal swab, presence of viral mutation(s) within the areas targeted by this assay, and inadequate number of viral copies (<250 copies / mL). A negative result must be combined with clinical observations, patient history, and epidemiological information.  Fact Sheet for Patients:   BoilerBrush.com.cy  Fact Sheet for Healthcare Providers: https://pope.com/  This test is not yet approved or  cleared by the [place] FDA and has been authorized for  detection and/or diagnosis of SARS-CoV-2 by FDA under an Emergency Use Authorization (EUA).  This EUA will remain in effect (meaning this test can be used) for the duration of the COVID-19 declaration under Section 564(b)(1) of the Act, 21 U.S.C. section 360bbb-3(b)(1), unless the authorization is terminated or revoked sooner.  Performed at [facility], [address] [place], [place] [address]      Labs:   CBC: Recent Labs  Lab 05/07/20 2112 05/08/20 0142  WBC 5.1 5.1  NEUTROABS 2.7  --   HGB 11.9* 11.5*  HCT 37.1 35.8*  MCV 90.7 91.3  PLT 258 248   Basic Metabolic Panel: Recent Labs  Lab 05/07/20 2112 05/08/20 0142 05/09/20 0535  NA 139  --  139  K 3.3*  --  4.3  CL 103  --  108  CO2 26  --  22  GLUCOSE 70  --  176*  BUN 25*  --  13  CREATININE 1.20* 0.98 0.76  CALCIUM 9.0  --  9.0   Liver Function Tests: Recent Labs  Lab 05/07/20 2112  AST 27  ALT 33  ALKPHOS 105  BILITOT 0.6  PROT 7.0  ALBUMIN 3.8   BNP (last 3 results) Recent Labs    05/07/20 2300 05/09/20 0535  BNP 18.7 114.1*   Cardiac Enzymes: No results for input(s): CKTOTAL, CKMB, CKMBINDEX, TROPONINI in the last 168 hours. CBG: Recent Labs  Lab 05/08/20 0925 05/08/20 1159 05/08/20 1807 05/08/20 2128 05/09/20 0854  GLUCAP 194* 87 207* 170* 128*   Hgb A1c Recent Labs    05/08/20 0142  HGBA1C 8.4*   Lipid Profile Recent Labs    05/08/20 0142  CHOL 154  HDL 57  LDLCALC 76  TRIG 105  CHOLHDL 2.7   Thyroid function studies Recent Labs    05/07/20 2329  TSH 0.395   Anemia work up No results for input(s): VITAMINB12, FOLATE, FERRITIN, TIBC, IRON, RETICCTPCT in the last 72 hours. Urinalysis No results found for: COLORURINE, APPEARANCEUR, LABSPEC, PHURINE, GLUCOSEU, HGBUR, BILIRUBINUR, KETONESUR, PROTEINUR, UROBILINOGEN, NITRITE, LEUKOCYTESUR       Time coordinating discharge: Over 45 minutes  SIGNED: Kendell Bane, MD, FACP, FHM. [facility],  Please use [email or web address] to Page If 7PM-7AM, please contact night-coverage [email or web address] 05/09/2020, 12:27 PM

## 2020-05-09 NOTE — Progress Notes (Signed)
Discharge instructions explained to pt / verbalized an understanding/ iv and tele removed/ will transport off unit via wheelchair when ride arrives.  

## 2020-05-09 NOTE — Evaluation (Signed)
Physical Therapy Evaluation Patient Details Name: Amanda [name] MRN: [id number] DOB: 08/09/1966 Today's Date: 05/09/2020   History of Present Illness  54 yo Female was moving over last few days and reports experiencing increased chest pain/pressure as well as shortness of breath. Patient came to ED and was admitted for telemetry/cardiac monitoring. She was found to be hypotensive; troponins negative; awaiting cardiac stress test; PMH significant: CHFIII, HTN, DMx2, Fibromyalgia, past right partial knee replacement, recent falls;  Clinical Impression  54 yo Female admitted for cardiac monitoring with acute shortness of breath/chest pain. Patient was living at home with her daughter. She reports her daughter goes to school during the day. She just moved into a new home that has no stairs/level entry, single story home. Patient does report multiple falls in last few months. She has a history of syncope which contributes to most falls. Patient denies any numbness/tingling. Her strength is WFL. She was mod I for bed mobility and transfers. She ambulated 175 feet without AD with supervision with slightly slower gait speed. Patient's SPO2/HR is WNL during ambulation. She does not exhibit need for acute skilled PT Intervention at this time. Patient agreeable. She would benefit from shower seat for safety with bathing.     Follow Up Recommendations No PT follow up    Equipment Recommendations  Other (comment) (will need a shower seat to assist with bathing;)    Recommendations for Other Services       Precautions / Restrictions Precautions Precautions: Fall Restrictions Weight Bearing Restrictions: No      Mobility  Bed Mobility Overal bed mobility: Modified Independent             General bed mobility comments: takes longer time;  Transfers Overall transfer level: Modified independent Equipment used: None             General transfer comment: more effort/extra  time;  Ambulation/Gait Ambulation/Gait assistance: Supervision Gait Distance (Feet): 175 Feet Assistive device: None Gait Pattern/deviations: Step-through pattern Gait velocity: decreased   General Gait Details: does exhibit slower gait speed, good foot clearance, no imbalance/instability noted;  Stairs            Wheelchair Mobility    Modified Rankin (Stroke Patients Only)       Balance Overall balance assessment: Modified Independent                                           Pertinent Vitals/Pain Pain Assessment: No/denies pain    Home Living Family/patient expects to be discharged to:: Private residence Living Arrangements: Children Available Help at Discharge: Family;Available PRN/intermittently;Other (Comment) Type of Home: House Home Access: Level entry     Home Layout: One level Home Equipment: Walker - standard      Prior Function Level of Independence: Needs assistance   Gait / Transfers Assistance Needed: reports 2 falls in last 3 months, does not use assistive device but has episodes of syncope  ADL's / Homemaking Assistance Needed: has home aide 4 days a week that assists with bathing, combing hair, medication management and meal prep as well as light housework;  Comments: daughter goes to school during the day;     Hand Dominance        Extremity/Trunk Assessment   Upper Extremity Assessment Upper Extremity Assessment: Overall WFL for tasks assessed    Lower Extremity Assessment Lower Extremity Assessment: Overall WFL for  tasks assessed    Cervical / Trunk Assessment Cervical / Trunk Assessment: Normal  Communication   Communication: No difficulties  Cognition Arousal/Alertness: Awake/alert Behavior During Therapy: WFL for tasks assessed/performed Overall Cognitive Status: Within Functional Limits for tasks assessed                                 General Comments: oriented x4;      General  Comments General comments (skin integrity, edema, etc.): no edema noted; good skin integrity;    Exercises     Assessment/Plan    PT Assessment Patent does not need any further PT services  PT Problem List         PT Treatment Interventions      PT Goals (Current goals can be found in the Care Plan section)  Acute Rehab PT Goals Patient Stated Goal: "to get better and go home." PT Goal Formulation: With patient Time For Goal Achievement: 05/09/20 Potential to Achieve Goals: Good    Frequency     Barriers to discharge        Co-evaluation               AM-PAC PT "6 Clicks" Mobility  Outcome Measure Help needed turning from your back to your side while in a flat bed without using bedrails?: None Help needed moving from lying on your back to sitting on the side of a flat bed without using bedrails?: None Help needed moving to and from a bed to a chair (including a wheelchair)?: None Help needed standing up from a chair using your arms (e.g., wheelchair or bedside chair)?: None Help needed to walk in hospital room?: None Help needed climbing 3-5 steps with a railing? : A Little 6 Click Score: 23    End of Session Equipment Utilized During Treatment: Gait belt Activity Tolerance: Patient tolerated treatment well;No increased pain Patient left: in chair;with call bell/phone within reach;with chair alarm set Nurse Communication: Mobility status;Other (comment) (needs shower seat;) PT Visit Diagnosis: Difficulty in walking, not elsewhere classified (R26.2)    Time: 0962-8366 PT Time Calculation (min) (ACUTE ONLY): 22 min   Charges:   PT Evaluation $PT Eval Low Complexity: 1 Low           Isa Hitz PT, DPT 05/09/2020, 10:05 AM

## 2020-05-09 NOTE — Progress Notes (Signed)
*  PRELIMINARY RESULTS* Echocardiogram 2D Echocardiogram has been performed.  Amanda [name] 05/09/2020, 8:33 AM 

## 2020-05-09 NOTE — TOC Progression Note (Signed)
Transition of Care [facility]) - Progression Note    Patient Details  Name: Amanda [name] MRN: [id number] Date of Birth: February 08, 1966  Transition of Care [facility]) CM/SW Contact  Shawn Route, RN Phone Number: 05/09/2020, 2:26 PM  Clinical Narrative:    Shower stool ordered for patient through [employer].  This will be shipped to her home.         Expected Discharge Plan and Services           Expected Discharge Date: 05/09/20                                     Social Determinants of Health (SDOH) Interventions    Readmission Risk Interventions No flowsheet data found.

## 2020-05-10 ENCOUNTER — Encounter (INDEPENDENT_AMBULATORY_CARE_PROVIDER_SITE_OTHER): Payer: Self-pay | Admitting: Family Medicine

## 2020-05-15 DIAGNOSIS — E0590 Thyrotoxicosis, unspecified without thyrotoxic crisis or storm: Secondary | ICD-10-CM | POA: Diagnosis not present

## 2020-05-15 DIAGNOSIS — E785 Hyperlipidemia, unspecified: Secondary | ICD-10-CM | POA: Diagnosis not present

## 2020-05-15 DIAGNOSIS — I10 Essential (primary) hypertension: Secondary | ICD-10-CM | POA: Diagnosis not present

## 2020-05-16 ENCOUNTER — Other Ambulatory Visit: Payer: Self-pay

## 2020-05-16 MED ORDER — MOMETASONE FUROATE 0.1 % EX OINT: TOPICAL_OINTMENT | Freq: Every day | CUTANEOUS | 0 refills | Status: DC

## 2020-05-16 MED ORDER — MUPIROCIN 2 % EX OINT: 1.0000 "application " | TOPICAL_OINTMENT | Freq: Every day | CUTANEOUS | 0 refills | Status: DC

## 2020-05-16 NOTE — Progress Notes (Signed)
Medication refills

## 2020-05-18 DIAGNOSIS — I951 Orthostatic hypotension: Secondary | ICD-10-CM | POA: Diagnosis not present

## 2020-05-18 DIAGNOSIS — E1165 Type 2 diabetes mellitus with hyperglycemia: Secondary | ICD-10-CM | POA: Diagnosis not present

## 2020-05-18 DIAGNOSIS — R69 Illness, unspecified: Secondary | ICD-10-CM | POA: Diagnosis not present

## 2020-05-18 DIAGNOSIS — Z20822 Contact with and (suspected) exposure to covid-19: Secondary | ICD-10-CM | POA: Diagnosis not present

## 2020-05-23 DIAGNOSIS — Z20822 Contact with and (suspected) exposure to covid-19: Secondary | ICD-10-CM | POA: Diagnosis not present

## 2020-05-29 DIAGNOSIS — E785 Hyperlipidemia, unspecified: Secondary | ICD-10-CM | POA: Diagnosis not present

## 2020-05-29 DIAGNOSIS — E1140 Type 2 diabetes mellitus with diabetic neuropathy, unspecified: Secondary | ICD-10-CM | POA: Diagnosis not present

## 2020-05-29 DIAGNOSIS — J449 Chronic obstructive pulmonary disease, unspecified: Secondary | ICD-10-CM | POA: Diagnosis not present

## 2020-05-29 DIAGNOSIS — Z794 Long term (current) use of insulin: Secondary | ICD-10-CM | POA: Diagnosis not present

## 2020-05-29 DIAGNOSIS — R69 Illness, unspecified: Secondary | ICD-10-CM | POA: Diagnosis not present

## 2020-05-29 DIAGNOSIS — E1143 Type 2 diabetes mellitus with diabetic autonomic (poly)neuropathy: Secondary | ICD-10-CM | POA: Diagnosis not present

## 2020-05-29 DIAGNOSIS — E11620 Type 2 diabetes mellitus with diabetic dermatitis: Secondary | ICD-10-CM | POA: Diagnosis not present

## 2020-05-31 DIAGNOSIS — M25561 Pain in right knee: Secondary | ICD-10-CM | POA: Diagnosis not present

## 2020-05-31 DIAGNOSIS — M25562 Pain in left knee: Secondary | ICD-10-CM | POA: Diagnosis not present

## 2020-05-31 DIAGNOSIS — M25551 Pain in right hip: Secondary | ICD-10-CM | POA: Diagnosis not present

## 2020-05-31 DIAGNOSIS — M25552 Pain in left hip: Secondary | ICD-10-CM | POA: Diagnosis not present

## 2020-05-31 DIAGNOSIS — R262 Difficulty in walking, not elsewhere classified: Secondary | ICD-10-CM | POA: Diagnosis not present

## 2020-05-31 DIAGNOSIS — M5417 Radiculopathy, lumbosacral region: Secondary | ICD-10-CM | POA: Diagnosis not present

## 2020-06-05 DIAGNOSIS — E1165 Type 2 diabetes mellitus with hyperglycemia: Secondary | ICD-10-CM | POA: Diagnosis not present

## 2020-06-05 DIAGNOSIS — E669 Obesity, unspecified: Secondary | ICD-10-CM | POA: Diagnosis not present

## 2020-06-06 DIAGNOSIS — F411 Generalized anxiety disorder: Secondary | ICD-10-CM | POA: Diagnosis not present

## 2020-06-06 DIAGNOSIS — R69 Illness, unspecified: Secondary | ICD-10-CM | POA: Diagnosis not present

## 2020-06-06 DIAGNOSIS — F4310 Post-traumatic stress disorder, unspecified: Secondary | ICD-10-CM | POA: Diagnosis not present

## 2020-06-12 ENCOUNTER — Ambulatory Visit: Payer: Medicare HMO | Admitting: Cardiology

## 2020-06-13 ENCOUNTER — Encounter: Payer: Self-pay | Admitting: Cardiology

## 2020-06-15 DIAGNOSIS — E1165 Type 2 diabetes mellitus with hyperglycemia: Secondary | ICD-10-CM | POA: Diagnosis not present

## 2020-06-20 ENCOUNTER — Ambulatory Visit: Payer: Medicare HMO | Admitting: Pulmonary Disease

## 2020-06-21 DIAGNOSIS — M25552 Pain in left hip: Secondary | ICD-10-CM | POA: Diagnosis not present

## 2020-06-21 DIAGNOSIS — M25562 Pain in left knee: Secondary | ICD-10-CM | POA: Diagnosis not present

## 2020-06-21 DIAGNOSIS — M25551 Pain in right hip: Secondary | ICD-10-CM | POA: Diagnosis not present

## 2020-06-21 DIAGNOSIS — M25561 Pain in right knee: Secondary | ICD-10-CM | POA: Diagnosis not present

## 2020-06-21 DIAGNOSIS — M5417 Radiculopathy, lumbosacral region: Secondary | ICD-10-CM | POA: Diagnosis not present

## 2020-06-21 DIAGNOSIS — R262 Difficulty in walking, not elsewhere classified: Secondary | ICD-10-CM | POA: Diagnosis not present

## 2020-06-26 DIAGNOSIS — M25551 Pain in right hip: Secondary | ICD-10-CM | POA: Diagnosis not present

## 2020-06-26 DIAGNOSIS — R262 Difficulty in walking, not elsewhere classified: Secondary | ICD-10-CM | POA: Diagnosis not present

## 2020-06-26 DIAGNOSIS — M25552 Pain in left hip: Secondary | ICD-10-CM | POA: Diagnosis not present

## 2020-06-26 DIAGNOSIS — M25562 Pain in left knee: Secondary | ICD-10-CM | POA: Diagnosis not present

## 2020-06-26 DIAGNOSIS — M25561 Pain in right knee: Secondary | ICD-10-CM | POA: Diagnosis not present

## 2020-06-26 DIAGNOSIS — M5417 Radiculopathy, lumbosacral region: Secondary | ICD-10-CM | POA: Diagnosis not present

## 2020-07-03 ENCOUNTER — Other Ambulatory Visit: Payer: Self-pay

## 2020-07-03 ENCOUNTER — Observation Stay
Admission: EM | Admit: 2020-07-03 | Discharge: 2020-07-04 | Disposition: A | Discharge status: Home / Self Care | Payer: Medicare HMO | Attending: Hospitalist | Admitting: Hospitalist

## 2020-07-03 ENCOUNTER — Encounter: Payer: Self-pay | Admitting: *Deleted

## 2020-07-03 DIAGNOSIS — N189 Chronic kidney disease, unspecified: Secondary | ICD-10-CM | POA: Insufficient documentation

## 2020-07-03 DIAGNOSIS — E860 Dehydration: Secondary | ICD-10-CM

## 2020-07-03 DIAGNOSIS — Z20822 Contact with and (suspected) exposure to covid-19: Secondary | ICD-10-CM | POA: Insufficient documentation

## 2020-07-03 DIAGNOSIS — I130 Hypertensive heart and chronic kidney disease with heart failure and stage 1 through stage 4 chronic kidney disease, or unspecified chronic kidney disease: Secondary | ICD-10-CM | POA: Insufficient documentation

## 2020-07-03 DIAGNOSIS — R55 Syncope and collapse: Principal | ICD-10-CM | POA: Diagnosis present

## 2020-07-03 DIAGNOSIS — R0789 Other chest pain: Secondary | ICD-10-CM | POA: Diagnosis not present

## 2020-07-03 DIAGNOSIS — E1165 Type 2 diabetes mellitus with hyperglycemia: Secondary | ICD-10-CM | POA: Diagnosis not present

## 2020-07-03 DIAGNOSIS — I110 Hypertensive heart disease with heart failure: Secondary | ICD-10-CM | POA: Diagnosis not present

## 2020-07-03 DIAGNOSIS — Z79899 Other long term (current) drug therapy: Secondary | ICD-10-CM | POA: Diagnosis not present

## 2020-07-03 DIAGNOSIS — I5022 Chronic systolic (congestive) heart failure: Secondary | ICD-10-CM | POA: Diagnosis not present

## 2020-07-03 DIAGNOSIS — J45909 Unspecified asthma, uncomplicated: Secondary | ICD-10-CM | POA: Diagnosis present

## 2020-07-03 DIAGNOSIS — I5033 Acute on chronic diastolic (congestive) heart failure: Secondary | ICD-10-CM | POA: Diagnosis not present

## 2020-07-03 DIAGNOSIS — I959 Hypotension, unspecified: Secondary | ICD-10-CM | POA: Diagnosis present

## 2020-07-03 DIAGNOSIS — I10 Essential (primary) hypertension: Secondary | ICD-10-CM | POA: Diagnosis present

## 2020-07-03 DIAGNOSIS — J90 Pleural effusion, not elsewhere classified: Secondary | ICD-10-CM | POA: Diagnosis not present

## 2020-07-03 DIAGNOSIS — R569 Unspecified convulsions: Secondary | ICD-10-CM | POA: Diagnosis not present

## 2020-07-03 DIAGNOSIS — R079 Chest pain, unspecified: Secondary | ICD-10-CM | POA: Diagnosis not present

## 2020-07-03 DIAGNOSIS — Z23 Encounter for immunization: Secondary | ICD-10-CM | POA: Insufficient documentation

## 2020-07-03 DIAGNOSIS — E119 Type 2 diabetes mellitus without complications: Secondary | ICD-10-CM | POA: Diagnosis not present

## 2020-07-03 DIAGNOSIS — I5032 Chronic diastolic (congestive) heart failure: Secondary | ICD-10-CM | POA: Diagnosis present

## 2020-07-03 DIAGNOSIS — N179 Acute kidney failure, unspecified: Secondary | ICD-10-CM

## 2020-07-03 DIAGNOSIS — R4182 Altered mental status, unspecified: Secondary | ICD-10-CM | POA: Diagnosis not present

## 2020-07-03 DIAGNOSIS — I509 Heart failure, unspecified: Secondary | ICD-10-CM

## 2020-07-03 DIAGNOSIS — F32A Depression, unspecified: Secondary | ICD-10-CM | POA: Diagnosis present

## 2020-07-03 DIAGNOSIS — J449 Chronic obstructive pulmonary disease, unspecified: Secondary | ICD-10-CM

## 2020-07-03 HISTORY — DX: Major depressive disorder, single episode, unspecified: F32.9

## 2020-07-03 HISTORY — DX: Migraine without aura, not intractable, without status migrainosus: G43.009

## 2020-07-03 LAB — CBC
HCT: 42.4 % (ref 36.0–46.0)
Hemoglobin: 13.7 g/dL (ref 12.0–15.0)
MCH: 29.5 pg (ref 26.0–34.0)
MCHC: 32.3 g/dL (ref 30.0–36.0)
MCV: 91.4 fL (ref 80.0–100.0)
Platelets: 363 10*3/uL (ref 150–400)
RBC: 4.64 MIL/uL (ref 3.87–5.11)
RDW: 15.1 % (ref 11.5–15.5)
WBC: 7.2 10*3/uL (ref 4.0–10.5)
nRBC: 0 % (ref 0.0–0.2)

## 2020-07-03 LAB — BASIC METABOLIC PANEL WITH GFR
Anion gap: 11 (ref 5–15)
BUN: 20 mg/dL (ref 6–20)
CO2: 22 mmol/L (ref 22–32)
Calcium: 8.9 mg/dL (ref 8.9–10.3)
Chloride: 100 mmol/L (ref 98–111)
Creatinine, Ser: 1.32 mg/dL — ABNORMAL HIGH (ref 0.44–1.00)
GFR, Estimated: 48 mL/min — ABNORMAL LOW
Glucose, Bld: 346 mg/dL — ABNORMAL HIGH (ref 70–99)
Potassium: 4.2 mmol/L (ref 3.5–5.1)
Sodium: 133 mmol/L — ABNORMAL LOW (ref 135–145)

## 2020-07-03 LAB — TROPONIN I (HIGH SENSITIVITY)
Troponin I (High Sensitivity): <2 ng/L
Troponin I (High Sensitivity): <2 ng/L

## 2020-07-03 NOTE — ED Triage Notes (Signed)
Pt brought in via ems from home.  Pt reports 2 syncopal episodes today.  Pt reports aching all over.  Pt has a headache.  No v/d.  Pt alert  Speech clear.

## 2020-07-03 NOTE — ED Notes (Signed)
FIRST NURSE: Pt to ER via EMS with near syncopal episode.  Pt orthostatic for EMS.  Family states pt may be dehydrated.  Unable to establish IV, pt hx of hard stick.

## 2020-07-04 ENCOUNTER — Emergency Department: Payer: Medicare HMO

## 2020-07-04 ENCOUNTER — Observation Stay: Payer: Medicare HMO

## 2020-07-04 ENCOUNTER — Encounter: Payer: Self-pay | Admitting: Emergency Medicine

## 2020-07-04 ENCOUNTER — Other Ambulatory Visit: Payer: Self-pay

## 2020-07-04 DIAGNOSIS — R4182 Altered mental status, unspecified: Secondary | ICD-10-CM | POA: Diagnosis not present

## 2020-07-04 DIAGNOSIS — E860 Dehydration: Secondary | ICD-10-CM

## 2020-07-04 DIAGNOSIS — I110 Hypertensive heart disease with heart failure: Secondary | ICD-10-CM | POA: Diagnosis not present

## 2020-07-04 DIAGNOSIS — I6523 Occlusion and stenosis of bilateral carotid arteries: Secondary | ICD-10-CM | POA: Diagnosis not present

## 2020-07-04 DIAGNOSIS — N179 Acute kidney failure, unspecified: Secondary | ICD-10-CM

## 2020-07-04 DIAGNOSIS — I5022 Chronic systolic (congestive) heart failure: Secondary | ICD-10-CM | POA: Diagnosis not present

## 2020-07-04 DIAGNOSIS — E041 Nontoxic single thyroid nodule: Secondary | ICD-10-CM | POA: Diagnosis not present

## 2020-07-04 DIAGNOSIS — R55 Syncope and collapse: Secondary | ICD-10-CM | POA: Diagnosis not present

## 2020-07-04 DIAGNOSIS — J449 Chronic obstructive pulmonary disease, unspecified: Secondary | ICD-10-CM

## 2020-07-04 DIAGNOSIS — E162 Hypoglycemia, unspecified: Secondary | ICD-10-CM | POA: Insufficient documentation

## 2020-07-04 DIAGNOSIS — J90 Pleural effusion, not elsewhere classified: Secondary | ICD-10-CM | POA: Diagnosis not present

## 2020-07-04 LAB — GLUCOSE, CAPILLARY
Glucose-Capillary: 100 mg/dL — ABNORMAL HIGH (ref 70–99)
Glucose-Capillary: 192 mg/dL — ABNORMAL HIGH (ref 70–99)
Glucose-Capillary: 182 mg/dL — ABNORMAL HIGH (ref 70–99)

## 2020-07-04 LAB — CBG MONITORING, ED: Glucose-Capillary: 107 mg/dL — ABNORMAL HIGH (ref 70–99)

## 2020-07-04 LAB — BASIC METABOLIC PANEL
Anion gap: 11 (ref 5–15)
BUN: 16 mg/dL (ref 6–20)
CO2: 27 mmol/L (ref 22–32)
Calcium: 9.2 mg/dL (ref 8.9–10.3)
Chloride: 102 mmol/L (ref 98–111)
Creatinine, Ser: 0.95 mg/dL (ref 0.44–1.00)
GFR, Estimated: >60 mL/min (ref >60)
Glucose, Bld: 196 mg/dL — ABNORMAL HIGH (ref 70–99)
Potassium: 3.7 mmol/L (ref 3.5–5.1)
Sodium: 140 mmol/L (ref 135–145)

## 2020-07-04 LAB — RESPIRATORY PANEL BY RT PCR (FLU A&B, COVID)
Influenza A by PCR: NEGATIVE
Influenza B by PCR: NEGATIVE
SARS Coronavirus 2 by RT PCR: NEGATIVE

## 2020-07-04 LAB — CBC
HCT: 43.4 % (ref 36.0–46.0)
Hemoglobin: 14 g/dL (ref 12.0–15.0)
MCH: 29.4 pg (ref 26.0–34.0)
MCHC: 32.3 g/dL (ref 30.0–36.0)
MCV: 91 fL (ref 80.0–100.0)
Platelets: 343 10*3/uL (ref 150–400)
RBC: 4.77 MIL/uL (ref 3.87–5.11)
RDW: 15.2 % (ref 11.5–15.5)
WBC: 6.9 10*3/uL (ref 4.0–10.5)
nRBC: 0 % (ref 0.0–0.2)

## 2020-07-04 LAB — BRAIN NATRIURETIC PEPTIDE: B Natriuretic Peptide: 26.4 pg/mL (ref 0.0–100.0)

## 2020-07-04 MED ORDER — ENOXAPARIN SODIUM 60 MG/0.6ML ~~LOC~~ SOLN
50.0000 mg | SUBCUTANEOUS | Status: DC
  Administered 2020-07-04: 50 mg via SUBCUTANEOUS
  Filled 2020-07-04: qty 0.6

## 2020-07-04 MED ORDER — INFLUENZA VAC SPLIT QUAD 0.5 ML IM SUSY
0.5000 mL | PREFILLED_SYRINGE | Freq: Once | INTRAMUSCULAR | Status: AC
  Administered 2020-07-04: 0.5 mL via INTRAMUSCULAR
  Filled 2020-07-04: qty 0.5

## 2020-07-04 MED ORDER — ONDANSETRON HCL 4 MG PO TABS: 4.0000 mg | ORAL_TABLET | Freq: Four times a day (QID) | ORAL | Status: DC | PRN

## 2020-07-04 MED ORDER — ONDANSETRON HCL 4 MG/2ML IJ SOLN: 4.0000 mg | Freq: Four times a day (QID) | INTRAMUSCULAR | Status: DC | PRN

## 2020-07-04 MED ORDER — SODIUM CHLORIDE 0.9% FLUSH
3.0000 mL | Freq: Two times a day (BID) | INTRAVENOUS | Status: DC
  Administered 2020-07-04 (×2): 3 mL via INTRAVENOUS

## 2020-07-04 MED ORDER — INFLUENZA VAC SPLIT QUAD 0.5 ML IM SUSY: 0.5000 mL | PREFILLED_SYRINGE | Freq: Once | INTRAMUSCULAR | 0 refills | Status: AC

## 2020-07-04 MED ORDER — LOSARTAN POTASSIUM 50 MG PO TABS: ORAL_TABLET | ORAL | 2 refills | Status: DC

## 2020-07-04 MED ORDER — HYDROCODONE-ACETAMINOPHEN 5-325 MG PO TABS: 1.0000 | ORAL_TABLET | ORAL | Status: DC | PRN

## 2020-07-04 MED ORDER — TRESIBA FLEXTOUCH 200 UNIT/ML ~~LOC~~ SOPN
PEN_INJECTOR | SUBCUTANEOUS | Status: DC
Start: 2020-07-04 — End: 2022-02-05

## 2020-07-04 MED ORDER — ACETAMINOPHEN 325 MG PO TABS: 650.0000 mg | ORAL_TABLET | Freq: Four times a day (QID) | ORAL | Status: DC | PRN

## 2020-07-04 MED ORDER — SODIUM CHLORIDE 0.9 % IV SOLN: INTRAVENOUS | Status: DC

## 2020-07-04 MED ORDER — INFLUENZA VAC SPLIT QUAD 0.5 ML IM SUSY: 0.5000 mL | PREFILLED_SYRINGE | INTRAMUSCULAR | Status: DC

## 2020-07-04 MED ORDER — INSULIN ASPART 100 UNIT/ML ~~LOC~~ SOLN: 8.0000 [IU] | Freq: Once | SUBCUTANEOUS | Status: DC

## 2020-07-04 MED ORDER — INSULIN ASPART 100 UNIT/ML ~~LOC~~ SOLN
0.0000 [IU] | Freq: Three times a day (TID) | SUBCUTANEOUS | Status: DC
  Administered 2020-07-04 (×2): 4 [IU] via SUBCUTANEOUS
  Filled 2020-07-04 (×2): qty 1

## 2020-07-04 MED ORDER — SODIUM CHLORIDE 0.9 % IV BOLUS
500.0000 mL | Freq: Once | INTRAVENOUS | Status: AC
  Administered 2020-07-04: 500 mL via INTRAVENOUS

## 2020-07-04 MED ORDER — INSULIN ASPART 100 UNIT/ML ~~LOC~~ SOLN: 0.0000 [IU] | Freq: Every day | SUBCUTANEOUS | Status: DC

## 2020-07-04 MED ORDER — ACETAMINOPHEN 650 MG RE SUPP: 650.0000 mg | Freq: Four times a day (QID) | RECTAL | Status: DC | PRN

## 2020-07-04 MED ORDER — SENNOSIDES-DOCUSATE SODIUM 8.6-50 MG PO TABS: 1.0000 | ORAL_TABLET | Freq: Every evening | ORAL | Status: DC | PRN

## 2020-07-04 NOTE — Progress Notes (Signed)
Pt need help with home insulin, stated she Cannot afford her medication most time.

## 2020-07-04 NOTE — H&P (Signed)
History and Physical    Amanda CampionLeomia [name] ZOX:[id number] DOB: [birth year] DOA: 07/03/2020  PCP: Emogene MorganAycock, Ngwe A, MD   Patient coming from: Home  I have personally briefly reviewed [facility]  Chief Complaint: Passed out  HPI: Amanda [name] is a 54 y.o. female with medical history significant for DM, HTN, diastolic heart failure, depression, fibromyalgia, COPD, hospitalized in September 2021 with a syncopal episode with work-up including a negative stress test, and normal echocardiogram with no systolic or diastolic dysfunction EF 50 to 81%55% who presents to the ER after she passed out while sitting in a chair at home, witnessed by her family.  She passed out again in the EMS vehicle.  Patient reported feeling fatigued throughout the course of the day, but she denied preceding chest pain, palpitations or lightheadedness, headache or visual disturbance, one-sided weakness numbness or tingling.  States that she felt dehydrated as she had not drunk her usual amount of water ED Course: On arrival, BP 90/60 with heart rate of 87.  Otherwise normal vitals.  Blood work Significant for creatinine of 1.32 up from baseline of 0.76.  Blood glucose 346, troponin less than 2x2 sets.  CBC WNL.   EKG as interpreted by me with no acute ST-T wave changes normal sinus rhythm.   CT head with no acute abnormalities and chest x-ray with no acute disease.   Patient given an IV fluid bolus.  Hospitalist consulted for admission.  Review of Systems: As per HPI otherwise all other systems on review of systems negative.    Past Medical History:  Diagnosis Date  . ADD (attention deficit disorder)   . Anxiety   . Arthritis    knees, shoulder, upper back  . Asthma   . CFS (chronic fatigue syndrome)   . Chewing difficulty   . CHF (congestive heart failure) (HCC)   . Chronic kidney disease   . Constipation   . Depression   . Depression   . Diabetes mellitus without complication (HCC)     . Dyspnea   . Environmental allergies   . Fatty liver   . Fibromyalgia   . GERD (gastroesophageal reflux disease)   . GI bleed 10/02/2018  . Headache    migraines - 5x/mo  . Hypertension   . Hypokalemia 10/14/2017  . Hypothyroidism   . IBS (irritable bowel syndrome)   . Joint pain   . Lower extremity edema   . Major depressive disorder, single episode   . Migraine without aura and without status migrainosus, not intractable   . Migraines   . Motion sickness    ships  . Osteoarthritis   . Other specified disorders of thyroid   . Sleep apnea   . Stroke [facility](HCC)    no residual deficits  . Swallowing difficulty   . Thyroid nodule    bilateral and goiter  . Vitamin D deficiency   . Wears contact lenses   . Wears dentures    partial upper    Past Surgical History:  Procedure Laterality Date  . ABDOMINAL HYSTERECTOMY  2000's  . ABDOMINAL HYSTERECTOMY    . ABDOMINAL SURGERY     laparoscopy x4 with lysis of adhesions  . APPENDECTOMY  1986  . CESAREAN SECTION  1992  . CHOLECYSTECTOMY N/A 04/03/2017   Procedure: LAPAROSCOPIC CHOLECYSTECTOMY;  Surgeon: Henrene DodgePiscoya, Jose, MD;  Location: [facility] ORS;  Service: General;  Laterality: N/A;  . COLONOSCOPY N/A 10/06/2018   Procedure: COLONOSCOPY;  Surgeon: Toney ReilVanga, Rohini Reddy,  MD;  Location: [facility] ENDOSCOPY;  Service: Gastroenterology;  Laterality: N/A;  . COLONOSCOPY WITH PROPOFOL N/A 03/10/2017   Procedure: COLONOSCOPY WITH PROPOFOL;  Surgeon: Scot Jun, MD;  Location: [facility] ENDOSCOPY;  Service: Endoscopy;  Laterality: N/A;  . ECTOPIC PREGNANCY SURGERY  2000's  . ESOPHAGOGASTRODUODENOSCOPY (EGD) WITH PROPOFOL N/A 03/10/2017   Procedure: ESOPHAGOGASTRODUODENOSCOPY (EGD) WITH PROPOFOL;  Surgeon: Scot Jun, MD;  Location: [facility] ENDOSCOPY;  Service: Endoscopy;  Laterality: N/A;  . HERNIA REPAIR    . JOINT REPLACEMENT Right 12/16/12   knee- medial - makoplasty  . KNEE ARTHROSCOPY Right 2006   partial medial and lateral meniscectomies  .  KNEE ARTHROSCOPY Right 10/06/2015   Procedure: RIGHT KNEE ARTHROSCOPY WITH DEBRIDEMENT;  Surgeon: Erin Sons, MD;  Location: [facility];  Service: Orthopedics;  Laterality: Right;  Diabetic - insulin and oral meds  . ROTATOR CUFF REPAIR Left 2006  . TUBAL LIGATION  2000's     reports that she has never smoked. She has never used smokeless tobacco. She reports previous alcohol use. She reports that she does not use drugs.  Allergies  Allergen Reactions  . Penicillins Anaphylaxis    Has patient had a PCN reaction causing immediate rash, facial/tongue/throat swelling, SOB or lightheadedness with hypotension: Yes Has patient had a PCN reaction causing severe rash involving mucus membranes or skin necrosis: No Has patient had a PCN reaction that required hospitalization No Has patient had a PCN reaction occurring within the last 10 years: No If all of the above answers are "NO", then may proceed with Cephalosporin use.   [name] Kitchen Penicillins Anaphylaxis  . Ace Inhibitors Swelling  . Ace Inhibitors Swelling    Family History  Problem Relation Age of Onset  . Hypertension Mother   . Diabetes Mother   . Heart disease Mother   . Depression Mother   . Obesity Mother   . Hypertension Father   . Diabetes Father   . Allergies Father   . Kidney disease Father   . Cancer Father   . Breast cancer Neg Hx       Prior to Admission medications   Medication Sig Start Date End Date Taking? Authorizing Provider  albuterol (PROVENTIL HFA) 108 (90 Base) MCG/ACT inhaler Inhale 2 puffs into the lungs every 4 (four) hours as needed for wheezing or shortness of breath. 07/13/19   Parrett, Virgel Bouquet, NP  Ascorbic Acid (VITAMIN C CR) 1000 MG TBCR Take by mouth.    [provider]  cholecalciferol (VITAMIN D) 25 MCG (1000 UNIT) tablet Take 1,000 Units by mouth daily. 05/01/20   [provider]  diltiazem (TIAZAC) 240 MG 24 hr capsule Take by mouth. 04/16/16   [provider]    DULoxetine (CYMBALTA) 30 MG capsule Take 30 mg by mouth daily. 04/12/20   [provider]  estradiol (ESTRACE) 0.1 MG/GM vaginal cream  10/15/19   [provider]  ferrous sulfate 220 (44 Fe) MG/5ML solution Take 220 mg by mouth 2 (two) times daily. 01/18/20   [provider]  fluticasone furoate-vilanterol (BREO ELLIPTA) 200-25 MCG/INH AEPB Inhale 1 puff into the lungs daily. 1 puff daily. 07/14/19   Parrett, Virgel Bouquet, NP  furosemide (LASIX) 20 MG tablet Take 20 mg by mouth daily. 04/12/20   [provider]  furosemide (LASIX) 40 MG tablet Take 1 tablet (40 mg total) by mouth daily. 08/27/19   Lurene Shadow, MD  gabapentin (NEURONTIN) 300 MG capsule Take 300 mg by mouth 2 (two)  times daily.  03/22/18   [provider]  ipratropium-albuterol (DUONEB) 0.5-2.5 (3) MG/3ML SOLN Inhale 3 mLs into the lungs 3 (three) times daily as needed (shortness of breath, wheezing or cough). 07/13/19   Parrett, Virgel Bouquet, NP  JARDIANCE 25 MG TABS tablet  10/15/19   [provider]  JARDIANCE 25 MG TABS tablet Take 25 mg by mouth daily. 03/18/20   [provider]  LATUDA 40 MG TABS tablet Take 40 mg by mouth at bedtime. 04/12/20   [provider]  lidocaine (LIDODERM) 5 % 1 patch daily. 08/16/19   [provider]  LINZESS 290 MCG CAPS capsule Take 1 tablet by mouth daily before breakfast.  02/28/17   [provider]  LINZESS 290 MCG CAPS capsule Take 290 mcg by mouth daily. 04/12/20   [provider]  liraglutide (VICTOZA) 18 MG/3ML SOPN Inject 0.5 mLs (3 mg total) into the skin daily. 01/11/20   Whitmire, Thermon Leyland, FNP  losartan (COZAAR) 50 MG tablet Take 0.5 tablets (25 mg total) by mouth daily. 05/09/20 06/08/20  Shahmehdi, Gemma Payor, MD  mirtazapine (REMERON SOL-TAB) 30 MG disintegrating tablet Take 30 mg by mouth at bedtime as needed (Nap).  03/18/18   [provider]  mirtazapine (REMERON) 15 MG tablet Take 15 mg by mouth  daily as needed.  04/12/20   [provider]  mometasone (ELOCON) 0.1 % ointment Apply 1 application topically 2 (two) times daily. 10/15/19   [provider]  mometasone (ELOCON) 0.1 % ointment Apply topically daily. 05/16/20   Deirdre Evener, MD  montelukast (SINGULAIR) 10 MG tablet Take 10 mg by mouth daily. 04/12/20   [provider]  mupirocin ointment (BACTROBAN) 2 % Apply 1 application topically daily. 05/16/20   Deirdre Evener, MD  omeprazole (PRILOSEC) 40 MG capsule Take 40 mg by mouth daily. 04/12/20   [provider]  potassium chloride SA (KLOR-CON) 20 MEQ tablet Take by mouth. 03/11/18   [provider]  potassium chloride SA (KLOR-CON) 20 MEQ tablet Take 20 mEq by mouth daily. 04/12/20   [provider]  pravastatin (PRAVACHOL) 40 MG tablet Take 40 mg by mouth daily. 04/12/20   [provider]  pravastatin (PRAVACHOL) 80 MG tablet Take 1 tablet (80 mg total) by mouth daily. 10/17/17 12/29/19  Johnson, Clanford L, MD  pregabalin (LYRICA) 150 MG capsule Take 150 mg by mouth 3 (three) times daily. 04/12/20   [provider]  PROAIR RESPICLICK 108 (90 Base) MCG/ACT AEPB Inhale 2 puffs into the lungs every 4 (four) hours as needed. 11/12/19   [provider]  propranolol (INDERAL) 80 MG tablet Take 80 mg by mouth daily.     [provider]  propranolol ER (INDERAL LA) 80 MG 24 hr capsule Take 80 mg by mouth daily. 04/12/20   [provider]  tiotropium (SPIRIVA HANDIHALER) 18 MCG inhalation capsule Place 1 capsule (18 mcg total) into inhaler and inhale daily. 07/13/19 12/29/19  Parrett, Virgel Bouquet, NP  tiZANidine (ZANAFLEX) 4 MG tablet Take 4 mg by mouth every 8 (eight) hours.     [provider]  topiramate (TOPAMAX) 100 MG tablet Take 100 mg by mouth 2 (two) times daily. 04/12/20   [provider]  TRESIBA FLEXTOUCH 200 UNIT/ML FlexTouch Pen Inject 72 Units into the skin daily. 11/17/19    [provider]  Vitamin D, Ergocalciferol, (DRISDOL) 1.25 MG (50000 UNIT) CAPS capsule Take 1 capsule (50,000 Units total) by mouth  every 7 (seven) days. 01/11/20   Whitmire, Thermon Leyland, FNP  XENICAL 120 MG capsule Take 120 mg by mouth as directed. 05/01/20   [provider]    Physical Exam: Vitals:   07/03/20 1726 07/03/20 1729 07/03/20 2127 07/03/20 2300  BP:  90/60 98/65 107/72  Pulse:  87 63 65  Resp:  18 16 18   Temp:  98.3 F (36.8 C) 98.8 F (37.1 C)   TempSrc:  Oral Oral   SpO2:   96% 98%  Weight: 104.3 kg     Height: 5\' 5"  (1.651 m)        Vitals:   07/03/20 1726 07/03/20 1729 07/03/20 2127 07/03/20 2300  BP:  90/60 98/65 107/72  Pulse:  87 63 65  Resp:  18 16 18   Temp:  98.3 F (36.8 C) 98.8 F (37.1 C)   TempSrc:  Oral Oral   SpO2:   96% 98%  Weight: 104.3 kg     Height: 5\' 5"  (1.651 m)         Constitutional: Alert and oriented x 3 . Not in any apparent distress HEENT:      Head: Normocephalic and atraumatic.         Eyes: PERLA, EOMI, Conjunctivae are normal. Sclera is non-icteric.       Mouth/Throat: Mucous membranes are moist.       Neck: Supple with no signs of meningismus. Cardiovascular: Regular rate and rhythm. No murmurs, gallops, or rubs. 2+ symmetrical distal pulses are present . No JVD. No LE edema Respiratory: Respiratory effort normal .Lungs sounds clear bilaterally. No wheezes, crackles, or rhonchi.  Gastrointestinal: Soft, non tender, and non distended with positive bowel sounds. No rebound or guarding. Genitourinary: No CVA tenderness. Musculoskeletal: Nontender with normal range of motion in all extremities. No cyanosis, or erythema of extremities. Neurologic:  Face is symmetric. Moving all extremities. No gross focal neurologic deficits . Skin: Skin is warm, dry.  No rash or ulcers Psychiatric: Mood and affect are normal    Labs on Admission: I have personally reviewed following labs and imaging studies  CBC: Recent  Labs  Lab 07/03/20 1728  WBC 7.2  HGB 13.7  HCT 42.4  MCV 91.4  PLT 363   Basic Metabolic Panel: Recent Labs  Lab 07/03/20 1728  NA 133*  K 4.2  CL 100  CO2 22  GLUCOSE 346*  BUN 20  CREATININE 1.32*  CALCIUM 8.9   GFR: Estimated Creatinine Clearance: 57.7 mL/min (A) (by C-G formula based on SCr of 1.32 mg/dL (H)). Liver Function Tests: No results for input(s): AST, ALT, ALKPHOS, BILITOT, PROT, ALBUMIN in the last 168 hours. No results for input(s): LIPASE, AMYLASE in the last 168 hours. No results for input(s): AMMONIA in the last 168 hours. Coagulation Profile: No results for input(s): INR, PROTIME in the last 168 hours. Cardiac Enzymes: No results for input(s): CKTOTAL, CKMB, CKMBINDEX, TROPONINI in the last 168 hours. BNP (last 3 results) No results for input(s): PROBNP in the last 8760 hours. HbA1C: No results for input(s): HGBA1C in the last 72 hours. CBG: Recent Labs  Lab 07/04/20 0022  GLUCAP 107*   Lipid Profile: No results for input(s): CHOL, HDL, LDLCALC, TRIG, CHOLHDL, LDLDIRECT in the last 72 hours. Thyroid Function Tests: No results for input(s): TSH, T4TOTAL, FREET4, T3FREE, THYROIDAB in the last 72 hours. Anemia Panel: No results for input(s): VITAMINB12, FOLATE, FERRITIN, TIBC, IRON, RETICCTPCT in the last 72 hours. Urine analysis:    Component Value  Date/Time   COLORURINE YELLOW 11/09/2019 0126   APPEARANCEUR CLEAR 11/09/2019 0126   APPEARANCEUR Cloudy 11/12/2011 1721   LABSPEC 1.010 11/09/2019 0126   LABSPEC 1.010 11/12/2011 1721   PHURINE 6.0 11/09/2019 0126   GLUCOSEU 500 (A) 11/09/2019 0126   GLUCOSEU Negative 11/12/2011 1721   HGBUR NEGATIVE 11/09/2019 0126   BILIRUBINUR NEGATIVE 11/09/2019 0126   BILIRUBINUR Negative 11/12/2011 1721   KETONESUR NEGATIVE 11/09/2019 0126   PROTEINUR NEGATIVE 11/09/2019 0126   NITRITE NEGATIVE 11/09/2019 0126   LEUKOCYTESUR NEGATIVE 11/09/2019 0126   LEUKOCYTESUR 1+ 11/12/2011 1721     Radiological Exams on Admission: DG Chest 2 View  Result Date: 07/04/2020 CLINICAL DATA:  Dyspnea, syncope EXAM: CHEST - 2 VIEW COMPARISON:  05/08/2019 FINDINGS: Lungs volumes are small, but are symmetric and are clear. No pneumothorax or pleural effusion. Cardiac size within normal limits. Pulmonary vascularity is normal. Osseous structures are age-appropriate. No acute bone abnormality. IMPRESSION: No active cardiopulmonary disease. Electronically Signed   By: Helyn Numbers MD   On: 07/04/2020 00:45   CT Head Wo Contrast  Result Date: 07/04/2020 CLINICAL DATA:  Mental status changes EXAM: CT HEAD WITHOUT CONTRAST TECHNIQUE: Contiguous axial images were obtained from the base of the skull through the vertex without intravenous contrast. COMPARISON:  10/09/2019 FINDINGS: Brain: No acute intracranial abnormality. Specifically, no hemorrhage, hydrocephalus, mass lesion, acute infarction, or significant intracranial injury. Vascular: No hyperdense vessel or unexpected calcification. Skull: No acute calvarial abnormality. Sinuses/Orbits: Visualized paranasal sinuses and mastoids clear. Orbital soft tissues unremarkable. Other: None IMPRESSION: No acute intracranial abnormality. Electronically Signed   By: Charlett Nose M.D.   On: 07/04/2020 00:33     Assessment/Plan 54 year old female with history of DM, HTN, diastolic heart failure, depression, COPD, hospitalized in September 2021 with a syncopal episode with work-up including a negative stress test, and normal echocardiogram with no systolic or diastolic dysfunction EF 50 to 53% presenting with syncopal episodes x2.    Recurrent syncope, suspect medication related -Patient with negative syncope work-up in September 2021 presenting with 2 syncopal episodes with finding of hypotension and acute kidney injury -Patient had pretty normal echocardiogram 05/2020 with no evidence of systolic or diastolic heart failure and also had a negative stress  test -Head CT negative -Hypotensive on arrival and was orthostatic with EMS -Suspect syncopal episodes may be medication related, specifically multiple antihypertensives, diuretics, with questionable CHF history as well as psychotropics -Currently on Lasix 20 daily, losartan 50, propranolol 80, as well as tizanidine, topiramate, Lyrica, mirtazapine -Hold all antihypertensives for now -Patient states that -We will get carotid ultrasound as not done during recent syncopal work-up  Hypotension, with history of hypertension Dehydration, suspect related to diuretics Acute kidney injury -Likely related to diuretics -Creatinine 1.36, almost twice baseline of 0.76 -Holding antihypertensives -IV hydration  Seizure disorder -Continue topiramate  Fibromyalgia -We will hold Lyrica and tizanidine    Depression -Continue Latuda    Chronic diastolic heart failure (HCC) -Echo from September normal -Holding diuretics, losartan. -Patient previously saw Dr. Wynelle Link: Until 4 months ago but will be seeing a new cardiologist at [facility] on November 22      Type 2 diabetes mellitus without complication (HCC) -Sliding scale insulin    COPD (chronic obstructive pulmonary disease) (HCC) -Continue home i Breo Ellipta, Proventil.  DuoNebs as needed    DVT prophylaxis: Lovenox  Code Status: full code  Family Communication:  none  Disposition Plan: Back to previous home environment Consults called: none  Status: Observation  Andris Baumann MD [facility]     07/04/2020, 1:05 AM

## 2020-07-04 NOTE — Progress Notes (Signed)
Patient is stable and ready for discharge home. Patient's IV removed. Patient received her flu vaccine prior to discharge right deltoid. Writer went over discharge paperwork with patient and she verbalized understanding and had no questions. Patient is scheduled to discharge and go to [facility] to have cardiac heart monitor applied prior to going home, patient's daughter is picking her up and taking her to have cardiac monitor placed. Patient dressed herself and packed her belongings. Patient is transporting via WC by nursing student and instructor to daughter's car.

## 2020-07-04 NOTE — ED Provider Notes (Signed)
[facility] Emergency Department Provider Note   ____________________________________________   First MD Initiated Contact with Patient 07/04/20 0005     (approximate)  I have reviewed the triage vital signs and the nursing notes.   HISTORY  Chief Complaint Loss of Consciousness    HPI Amanda [name] is a 54 y.o. female that extensive past medical history notable for CHF, chronic kidney disease, diabetes  Patient reports that while sitting in a chair she passed out today.  She has been feeling fatigued throughout the course of the day.  She did not fall or become injured it was witnessed by family.  She then passed out a second time on EMS arrival  She feels "dehydrated" and tired.  She denies any injury.  She does report a mild feeling of a headache across the top of her scalp but this is not uncommon  She does monitor her blood sugars.  She reports having run out or not having short acting insulin recently  No chest pain.  No trouble breathing.  Slight feeling of shortness of breath throughout the day today but denies any weight gain or leg swelling   Past Medical History:  Diagnosis Date  . ADD (attention deficit disorder)   . Anxiety   . Arthritis    knees, shoulder, upper back  . Asthma   . CFS (chronic fatigue syndrome)   . Chewing difficulty   . CHF (congestive heart failure) (HCC)   . Chronic kidney disease   . Constipation   . Depression   . Depression   . Diabetes mellitus without complication (HCC)   . Dyspnea   . Environmental allergies   . Fatty liver   . Fibromyalgia   . GERD (gastroesophageal reflux disease)   . GI bleed 10/02/2018  . Headache    migraines - 5x/mo  . Hypertension   . Hypokalemia 10/14/2017  . Hypothyroidism   . IBS (irritable bowel syndrome)   . Joint pain   . Lower extremity edema   . Major depressive disorder, single episode   . Migraine without aura and without status migrainosus, not intractable   .  Migraines   . Motion sickness    ships  . Osteoarthritis   . Other specified disorders of thyroid   . Sleep apnea   . Stroke [facility])    no residual deficits  . Swallowing difficulty   . Thyroid nodule    bilateral and goiter  . Vitamin D deficiency   . Wears contact lenses   . Wears dentures    partial upper    Patient Active Problem List   Diagnosis Date Noted  . Hypoglycemia 07/04/2020  . AKI (acute kidney injury) (HCC) 07/04/2020  . Chest pain 05/07/2020  . Hypotension 05/07/2020  . Recurrent syncope 05/07/2020  . Type 2 diabetes mellitus without complication (HCC) 05/07/2020  . Asthma 05/07/2020  . Chronic congestive heart failure (HCC) 05/07/2020  . Epilepsy (HCC) 01/12/2020  . Vitamin D deficiency 01/12/2020  . Class 2 severe obesity with serious comorbidity and body mass index (BMI) of 36.0 to 36.9 in adult [facility]) 01/12/2020  . Syncope 08/27/2019  . Syncope and collapse 08/26/2019  . Asthma 07/13/2019  . Hypotension 10/10/2018  . Acute on chronic diastolic (congestive) heart failure (HCC) 07/17/2018  . Acute on chronic heart failure (HCC) 07/08/2018  . Chronic diastolic heart failure (HCC) 04/17/2018  . Lymphedema 04/17/2018  . Chest pain 03/28/2018  . Hypertension 10/15/2017  . Wears dentures 10/15/2017  .  Symptomatic cholelithiasis 04/02/2017  . Recurrent incisional hernia 03/17/2017  . Calculus of gallbladder without cholecystitis without obstruction 03/17/2017  . Helicobacter pylori gastritis 03/17/2017  . Depression 09/28/2016  . Diabetes mellitus, type 2 (HCC) 09/28/2016  . Tremor 07/01/2013  . Esophageal reflux 06/22/2012  . Major depressive disorder, single episode 08/22/2009  . Sleep apnea 05/30/2009  . Hyperlipidemia 08/10/2008    Past Surgical History:  Procedure Laterality Date  . ABDOMINAL HYSTERECTOMY  2000's  . ABDOMINAL HYSTERECTOMY    . ABDOMINAL SURGERY     laparoscopy x4 with lysis of adhesions  . APPENDECTOMY  1986  . CESAREAN  SECTION  1992  . CHOLECYSTECTOMY N/A 04/03/2017   Procedure: LAPAROSCOPIC CHOLECYSTECTOMY;  Surgeon: Henrene Dodge, MD;  Location: [facility] ORS;  Service: General;  Laterality: N/A;  . COLONOSCOPY N/A 10/06/2018   Procedure: COLONOSCOPY;  Surgeon: Toney Reil, MD;  Location: [facility] ENDOSCOPY;  Service: Gastroenterology;  Laterality: N/A;  . COLONOSCOPY WITH PROPOFOL N/A 03/10/2017   Procedure: COLONOSCOPY WITH PROPOFOL;  Surgeon: Scot Jun, MD;  Location: [facility] ENDOSCOPY;  Service: Endoscopy;  Laterality: N/A;  . ECTOPIC PREGNANCY SURGERY  2000's  . ESOPHAGOGASTRODUODENOSCOPY (EGD) WITH PROPOFOL N/A 03/10/2017   Procedure: ESOPHAGOGASTRODUODENOSCOPY (EGD) WITH PROPOFOL;  Surgeon: Scot Jun, MD;  Location: [facility] ENDOSCOPY;  Service: Endoscopy;  Laterality: N/A;  . HERNIA REPAIR    . JOINT REPLACEMENT Right 12/16/12   knee- medial - makoplasty  . KNEE ARTHROSCOPY Right 2006   partial medial and lateral meniscectomies  . KNEE ARTHROSCOPY Right 10/06/2015   Procedure: RIGHT KNEE ARTHROSCOPY WITH DEBRIDEMENT;  Surgeon: Erin Sons, MD;  Location: [facility];  Service: Orthopedics;  Laterality: Right;  Diabetic - insulin and oral meds  . ROTATOR CUFF REPAIR Left 2006  . TUBAL LIGATION  2000's    Prior to Admission medications   Medication Sig Start Date End Date Taking? Authorizing Provider  albuterol (PROVENTIL HFA) 108 (90 Base) MCG/ACT inhaler Inhale 2 puffs into the lungs every 4 (four) hours as needed for wheezing or shortness of breath. 07/13/19   Parrett, Virgel Bouquet, NP  Ascorbic Acid (VITAMIN C CR) 1000 MG TBCR Take by mouth.    [provider]  cholecalciferol (VITAMIN D) 25 MCG (1000 UNIT) tablet Take 1,000 Units by mouth daily. 05/01/20   [provider]  diltiazem (TIAZAC) 240 MG 24 hr capsule Take by mouth. 04/16/16   [provider]  DULoxetine (CYMBALTA) 30 MG capsule Take 30 mg by mouth daily. 04/12/20   [provider]  estradiol  (ESTRACE) 0.1 MG/GM vaginal cream  10/15/19   [provider]  ferrous sulfate 220 (44 Fe) MG/5ML solution Take 220 mg by mouth 2 (two) times daily. 01/18/20   [provider]  fluticasone furoate-vilanterol (BREO ELLIPTA) 200-25 MCG/INH AEPB Inhale 1 puff into the lungs daily. 1 puff daily. 07/14/19   Parrett, Virgel Bouquet, NP  furosemide (LASIX) 20 MG tablet Take 20 mg by mouth daily. 04/12/20   [provider]  furosemide (LASIX) 40 MG tablet Take 1 tablet (40 mg total) by mouth daily. 08/27/19   Lurene Shadow, MD  gabapentin (NEURONTIN) 300 MG capsule Take 300 mg by mouth 2 (two) times daily.  03/22/18   [provider]  ipratropium-albuterol (DUONEB) 0.5-2.5 (3) MG/3ML SOLN Inhale 3 mLs into the lungs 3 (three) times daily as needed (shortness of breath, wheezing or cough). 07/13/19   Parrett, Virgel Bouquet, NP  JARDIANCE 25 MG TABS tablet  10/15/19  [provider]  JARDIANCE 25 MG TABS tablet Take 25 mg by mouth daily. 03/18/20   [provider]  LATUDA 40 MG TABS tablet Take 40 mg by mouth at bedtime. 04/12/20   [provider]  lidocaine (LIDODERM) 5 % 1 patch daily. 08/16/19   [provider]  LINZESS 290 MCG CAPS capsule Take 1 tablet by mouth daily before breakfast.  02/28/17   [provider]  LINZESS 290 MCG CAPS capsule Take 290 mcg by mouth daily. 04/12/20   [provider]  liraglutide (VICTOZA) 18 MG/3ML SOPN Inject 0.5 mLs (3 mg total) into the skin daily. 01/11/20   Whitmire, Thermon Leyland, FNP  losartan (COZAAR) 50 MG tablet Take 0.5 tablets (25 mg total) by mouth daily. 05/09/20 06/08/20  Shahmehdi, Gemma Payor, MD  mirtazapine (REMERON SOL-TAB) 30 MG disintegrating tablet Take 30 mg by mouth at bedtime as needed (Nap).  03/18/18   [provider]  mirtazapine (REMERON) 15 MG tablet Take 15 mg by mouth daily as needed.  04/12/20   [provider]  mometasone (ELOCON) 0.1 % ointment Apply 1 application  topically 2 (two) times daily. 10/15/19   [provider]  mometasone (ELOCON) 0.1 % ointment Apply topically daily. 05/16/20   Deirdre Evener, MD  montelukast (SINGULAIR) 10 MG tablet Take 10 mg by mouth daily. 04/12/20   [provider]  mupirocin ointment (BACTROBAN) 2 % Apply 1 application topically daily. 05/16/20   Deirdre Evener, MD  omeprazole (PRILOSEC) 40 MG capsule Take 40 mg by mouth daily. 04/12/20   [provider]  potassium chloride SA (KLOR-CON) 20 MEQ tablet Take by mouth. 03/11/18   [provider]  potassium chloride SA (KLOR-CON) 20 MEQ tablet Take 20 mEq by mouth daily. 04/12/20   [provider]  pravastatin (PRAVACHOL) 40 MG tablet Take 40 mg by mouth daily. 04/12/20   [provider]  pravastatin (PRAVACHOL) 80 MG tablet Take 1 tablet (80 mg total) by mouth daily. 10/17/17 12/29/19  Johnson, Clanford L, MD  pregabalin (LYRICA) 150 MG capsule Take 150 mg by mouth 3 (three) times daily. 04/12/20   [provider]  PROAIR RESPICLICK 108 (90 Base) MCG/ACT AEPB Inhale 2 puffs into the lungs every 4 (four) hours as needed. 11/12/19   [provider]  propranolol (INDERAL) 80 MG tablet Take 80 mg by mouth daily.     [provider]  propranolol ER (INDERAL LA) 80 MG 24 hr capsule Take 80 mg by mouth daily. 04/12/20   [provider]  tiotropium (SPIRIVA HANDIHALER) 18 MCG inhalation capsule Place 1 capsule (18 mcg total) into inhaler and inhale daily. 07/13/19 12/29/19  Parrett, Virgel Bouquet, NP  tiZANidine (ZANAFLEX) 4 MG tablet Take 4 mg by mouth every 8 (eight) hours.     [provider]  topiramate (TOPAMAX) 100 MG tablet Take 100 mg by mouth 2 (two) times daily. 04/12/20   [provider]  TRESIBA FLEXTOUCH 200 UNIT/ML FlexTouch Pen Inject 72 Units into the skin daily. 11/17/19   [provider]  Vitamin D, Ergocalciferol, (DRISDOL) 1.25 MG (50000 UNIT) CAPS capsule Take 1  capsule (50,000 Units total) by mouth every 7 (seven) days. 01/11/20   Whitmire, Thermon Leyland, FNP  XENICAL 120 MG capsule Take 120 mg by mouth as directed. 05/01/20   [provider]    Allergies Penicillins, Penicillins, Ace inhibitors, and Ace inhibitors  Family History  Problem Relation Age of Onset  .  Hypertension Mother   . Diabetes Mother   . Heart disease Mother   . Depression Mother   . Obesity Mother   . Hypertension Father   . Diabetes Father   . Allergies Father   . Kidney disease Father   . Cancer Father   . Breast cancer Neg Hx     Social History Social History   Tobacco Use  . Smoking status: Never Smoker  . Smokeless tobacco: Never Used  Vaping Use  . Vaping Use: Never used  Substance Use Topics  . Alcohol use: Not Currently  . Drug use: Never    Review of Systems Constitutional: No fever/chills Eyes: No visual changes. ENT: No sore throat. Cardiovascular: Denies chest pain. Respiratory: Very slight feeling of shortness of breath throughout the day today Gastrointestinal: No abdominal pain.   Genitourinary: Negative for dysuria. Musculoskeletal: Negative for back pain. Skin: Negative for rash. Neurological: Negative for headaches, areas of focal weakness or numbness.    ____________________________________________   PHYSICAL EXAM:  VITAL SIGNS: ED Triage Vitals  Enc Vitals Group     BP 07/03/20 1729 90/60     Pulse Rate 07/03/20 1729 87     Resp 07/03/20 1729 18     Temp 07/03/20 1729 98.3 F (36.8 C)     Temp Source 07/03/20 1729 Oral     SpO2 07/03/20 2127 96 %     Weight 07/03/20 1726 230 lb (104.3 kg)     Height 07/03/20 1726 5\' 5"  (1.651 m)     Head Circumference --      Peak Flow --      Pain Score 07/03/20 1726 8     Pain Loc --      Pain Edu? --      Excl. in GC? --     Constitutional: Alert and oriented. Well appearing and in no acute distress. Eyes: Conjunctivae are normal. Head: Atraumatic. Nose: No  congestion/rhinnorhea. Mouth/Throat: Mucous membranes are moist. Neck: No stridor.  Cardiovascular: Normal rate, regular rhythm. Grossly normal heart sounds.  Good peripheral circulation. Respiratory: Normal respiratory effort.  No retractions. Lungs CTAB. Gastrointestinal: Soft and nontender. No distention. Musculoskeletal: No lower extremity tenderness nor edema. Neurologic:  Normal speech and language. No gross focal neurologic deficits are appreciated.  He seems just slightly slow in cognition at times, but follows all commands and is fully oriented.  Normal cranial nerve exam.  Normal strength in all extremities.  No pronator drift in any extremity.  Normal sensation. Skin:  Skin is warm, dry and intact. No rash noted. Psychiatric: Mood and affect are normal. Speech and behavior are normal.  ____________________________________________   LABS (all labs ordered are listed, but only abnormal results are displayed)  Labs Reviewed  BASIC METABOLIC PANEL - Abnormal; Notable for the following components:      Result Value   Sodium 133 (*)    Glucose, Bld 346 (*)    Creatinine, Ser 1.32 (*)    GFR, Estimated 48 (*)    All other components within normal limits  CBG MONITORING, ED - Abnormal; Notable for the following components:   Glucose-Capillary 107 (*)    All other components within normal limits  RESPIRATORY PANEL BY RT PCR (FLU A&B, COVID)  CBC  BRAIN NATRIURETIC PEPTIDE  TROPONIN I (HIGH SENSITIVITY)  TROPONIN I (HIGH SENSITIVITY)   ____________________________________________  EKG  Reviewed interpreted at 1740 Heart rate 80 QRS 80 QTc 440 Normal sinus rhythm no evidence of acute ischemia denoted  ____________________________________________  RADIOLOGY  DG Chest 2 View  Result Date: 07/04/2020 CLINICAL DATA:  Dyspnea, syncope EXAM: CHEST - 2 VIEW COMPARISON:  05/08/2019 FINDINGS: Lungs volumes are small, but are symmetric and are clear. No pneumothorax or pleural  effusion. Cardiac size within normal limits. Pulmonary vascularity is normal. Osseous structures are age-appropriate. No acute bone abnormality. IMPRESSION: No active cardiopulmonary disease. Electronically Signed   By: Helyn Numbers MD   On: 07/04/2020 00:45   CT Head Wo Contrast  Result Date: 07/04/2020 CLINICAL DATA:  Mental status changes EXAM: CT HEAD WITHOUT CONTRAST TECHNIQUE: Contiguous axial images were obtained from the base of the skull through the vertex without intravenous contrast. COMPARISON:  10/09/2019 FINDINGS: Brain: No acute intracranial abnormality. Specifically, no hemorrhage, hydrocephalus, mass lesion, acute infarction, or significant intracranial injury. Vascular: No hyperdense vessel or unexpected calcification. Skull: No acute calvarial abnormality. Sinuses/Orbits: Visualized paranasal sinuses and mastoids clear. Orbital soft tissues unremarkable. Other: None IMPRESSION: No acute intracranial abnormality. Electronically Signed   By: Charlett Nose M.D.   On: 07/04/2020 00:33    Chest x-ray reviewed negative for acute new hyaline CT head reviewed negative for acute ____________________________________________   PROCEDURES  Procedure(s) performed: None  Procedures  Critical Care performed: No  ____________________________________________   INITIAL IMPRESSION / ASSESSMENT AND PLAN / ED COURSE  Pertinent labs & imaging results that were available during my care of the patient were reviewed by me and considered in my medical decision making (see chart for details).   Patient presents after syncopal episode.  Given the clinical history I suspect that dehydration may be in relation but she does report 2 episodes of syncope today and does have a history of CHF making her at higher risk for adverse event, and thus discussed with the patient and recommended observation which patient is in agreement with.  Discussed with the hospitalist Dr. Valentina Lucks  Patient's labs reviewed  mild hyponatremia mild elevation of creatinine and mild hyperglycemia which has corrected.  GFR slightly reduced pointing towards potential prerenal etiology which I suspect is dehydration based on clinical history  Troponins are normal and reassuring.  No associated chest pain.  Will send BNP to evaluate for potential volume overload, though chest x-ray is negative and her lungs are clear.  She does report a very slight sensation of shortness of breath throughout the day today associated with fatigue.  No chest pain  CT head performed due to headache after syncopal episode, this is negative for acute finding no trauma.  Patient reports a mild headache over the top of the scalp.  She is neurologically intact     Janazia Schreier was evaluated in Emergency Department on 07/04/2020 for the symptoms described in the history of present illness. She was evaluated in the context of the global COVID-19 pandemic, which necessitated consideration that the patient might be at risk for infection with the SARS-CoV-2 virus that causes COVID-19. Institutional protocols and algorithms that pertain to the evaluation of patients at risk for COVID-19 are in a state of rapid change based on information released by regulatory bodies including the CDC and federal and state organizations. These policies and algorithms were followed during the patient's care in the ED.  ____________________________________________   FINAL CLINICAL IMPRESSION(S) / ED DIAGNOSES  Final diagnoses:  Syncope and collapse  Chronic congestive heart failure, unspecified heart failure type (HCC)  Dehydration, mild        Note:  This document was prepared using Dragon voice recognition software and may  include unintentional dictation errors       Sharyn Creamer, MD 07/04/20 [phone]

## 2020-07-04 NOTE — ED Notes (Signed)
Attempted to call pt's daughter to let her know she is moving to inpatient room, no answer

## 2020-07-04 NOTE — ED Notes (Signed)
Pt's daughter updated with pt permission

## 2020-07-04 NOTE — Discharge Summary (Signed)
Physician Discharge Summary   Amanda [name]  female DOB: 1966-02-13  ZOX:[id number]  PCP: Emogene Morgan, MD  Admit date: 07/03/2020 Discharge date: 07/04/2020  Admitted From: home Disposition:  home CODE STATUS: Full code  Discharge Instructions    Discharge instructions   Complete by: As directed    Your blood pressure was low on presentation to the hospital, which can be due to too much blood pressure medications or too much sedation medications.  I have made some adjustment in your home medications, please see discharge paper.  You also mentioned not getting enough hydration after you ran out of flavored water.  Please be sure to drink fluid even when you don't have flavored water.    Please go to cardiology clinic right after discharge to receive a cardiac monitor, and follow up with cardiology to ensure you don't have irregular heart rhythm.     Dr. Darlin Priestly [facility] Course:  For full details, please see H&P, progress notes, consult notes and ancillary notes.  Briefly,  Amanda [name] is a 54 y.o. female with medical history significant for DM, HTN, diastolic heart failure, depression, fibromyalgia, COPD, hospitalized in September 2021 with a syncopal episode with work-up including a negative stress test, and normal echocardiogram who presented to the ER after she passed out while sitting in a chair at home, witnessed by her family.  She passed out again in the EMS vehicle.    Patient reported feeling fatigued throughout the course of the day, but she denied preceding chest pain, palpitations or lightheadedness, headache or visual disturbance, one-sided weakness numbness or tingling.  Stated that she felt dehydrated as she had not drunk her usual amount of water.  Recurrent syncope Likely a combination of dehydration, and too much BP medications and sedating medications.  Pt was hypotensive on arrival and was orthostatic with EMS.  Head CT negative.  Pt's home  meds included Lasix, losartan 50, propranolol 80, as well as tizanidine, topiramate, Lyrica, mirtazapine, hydrOXYzine.  Pt also reported not drinking enough fluids because she ran out of flavored water (the only kind of water she would drink).  Pt had previously been seen by [facility] cardiology.  To complete the workup for syncope, Dr. Gwen Pounds was contacted who had arranged for pt to receive long-term cardiac monitor in the clinic.    Hypotension, with history of hypertension Dehydration, suspect related to diuretics Acute kidney injury Likely related to taking diuretics while already dehydrated.  On presentation, Creatinine 1.36, almost twice baseline of 0.76.  Cr improved to 0.95 next day with IV hydration.  Home lasix d/c'ed since pt's recent Echo showed normal systolic and diastolic function.  Home Losartan held until outpatient followup.  Seizure disorder Continued topiramate  Fibromyalgia Discharged on home Lyrica and tizanidine.  Gabapentin d/c'ed.  Depression Continued Latuda  Hx of Chronic diastolic heart failure Echo from September 2021 showed normal systolic and diastolic function.  Home lasix d/c'ed.  Home Losartan held until outpatient followup.      Type 2 diabetes mellitus without complication (HCC) Discharged on home regimen.    COPD (chronic obstructive pulmonary disease) (HCC) Stable.  Continued home bronchodilators.  DuoNebs as needed.   Discharge Diagnoses:  Principal Problem:   Recurrent syncope Active Problems:   Depression   Hypertension   Chronic diastolic heart failure (HCC)   Hypotension   Syncope   Type 2 diabetes mellitus without complication (HCC)   Asthma   AKI (  acute kidney injury) (HCC)   Dehydration   COPD (chronic obstructive pulmonary disease) (HCC)    Discharge Instructions:  Allergies as of 07/04/2020      Reactions   Penicillins Anaphylaxis   Has patient had a PCN reaction causing immediate rash, facial/tongue/throat  swelling, SOB or lightheadedness with hypotension: Yes Has patient had a PCN reaction causing severe rash involving mucus membranes or skin necrosis: No Has patient had a PCN reaction that required hospitalization No Has patient had a PCN reaction occurring within the last 10 years: No If all of the above answers are "NO", then may proceed with Cephalosporin use.   Penicillins Anaphylaxis   Ace Inhibitors Swelling   Ace Inhibitors Swelling      Medication List    STOP taking these medications   Breo Ellipta 200-25 MCG/INH Aepb Generic drug: fluticasone furoate-vilanterol   furosemide 20 MG tablet Commonly known as: LASIX   furosemide 40 MG tablet Commonly known as: LASIX   gabapentin 300 MG capsule Commonly known as: NEURONTIN   hydrOXYzine 25 MG capsule Commonly known as: VISTARIL   ipratropium-albuterol 0.5-2.5 (3) MG/3ML Soln Commonly known as: DUONEB   mometasone 0.1 % ointment Commonly known as: ELOCON   mupirocin ointment 2 % Commonly known as: BACTROBAN   Spiriva HandiHaler 18 MCG inhalation capsule Generic drug: tiotropium   Vitamin D (Ergocalciferol) 1.25 MG (50000 UNIT) Caps capsule Commonly known as: DRISDOL   zolpidem 10 MG tablet Commonly known as: AMBIEN     TAKE these medications   cholecalciferol 25 MCG (1000 UNIT) tablet Commonly known as: VITAMIN D Take 1,000 Units by mouth daily. Notes to patient: Not given in hospital   DULoxetine 30 MG capsule Commonly known as: CYMBALTA Take 30 mg by mouth daily. Notes to patient: Not given in hospital   influenza vac split quadrivalent PF 0.5 ML injection Commonly known as: FLUARIX Inject 0.5 mLs into the muscle once for 1 dose. Notes to patient: Not given in hospital   Jardiance 25 MG Tabs tablet Generic drug: empagliflozin Take 25 mg by mouth daily. Notes to patient: Not given in hospital   Latuda 40 MG Tabs tablet Generic drug: lurasidone Take 40 mg by mouth at bedtime. Notes to patient:  Not given in hospital   Linzess 290 MCG Caps capsule Generic drug: linaclotide Take 290 mcg by mouth daily. Notes to patient: Not given in hospital   liraglutide 18 MG/3ML Sopn Commonly known as: VICTOZA Inject 0.5 mLs (3 mg total) into the skin daily. Notes to patient: Not given in hospital   losartan 50 MG tablet Commonly known as: COZAAR Hold until followup with outpatient doctor because your blood pressure was low on presentation to the hospital. What changed:   how much to take  how to take this  when to take this  additional instructions Notes to patient: Follow instructions   mirtazapine 30 MG disintegrating tablet Commonly known as: REMERON SOL-TAB Take 30 mg by mouth at bedtime as needed (Nap). Notes to patient: Not given in hospital   montelukast 10 MG tablet Commonly known as: SINGULAIR Take 10 mg by mouth daily. Notes to patient: Not given in hospital   NovoLOG FlexPen 100 UNIT/ML FlexPen Generic drug: insulin aspart Inject 10 Units into the skin daily.   omeprazole 40 MG capsule Commonly known as: PRILOSEC Take 40 mg by mouth daily. Notes to patient: Not given in hospital   potassium chloride SA 20 MEQ tablet Commonly known as: KLOR-CON Take 20 mEq  by mouth daily. Notes to patient: Not given in hospital   pravastatin 40 MG tablet Commonly known as: PRAVACHOL Take 40 mg by mouth daily. What changed: Another medication with the same name was removed. Continue taking this medication, and follow the directions you see here. Notes to patient: Follow instructions   pregabalin 150 MG capsule Commonly known as: LYRICA Take 150 mg by mouth 3 (three) times daily.   ProAir RespiClick 108 (90 Base) MCG/ACT Aepb Generic drug: Albuterol Sulfate Inhale 2 puffs into the lungs every 4 (four) hours as needed. What changed: Another medication with the same name was removed. Continue taking this medication, and follow the directions you see here. Notes to  patient: Not given in hospital   propranolol ER 80 MG 24 hr capsule Commonly known as: INDERAL LA Take 80 mg by mouth daily. Notes to patient: Not given in hospital   tiZANidine 4 MG tablet Commonly known as: ZANAFLEX Take 4 mg by mouth every 8 (eight) hours as needed for muscle spasms. Notes to patient: Not given in hospital   topiramate 100 MG tablet Commonly known as: TOPAMAX Take 100 mg by mouth 2 (two) times daily. Notes to patient: Not given in hospital   Tresiba FlexTouch 200 UNIT/ML FlexTouch Pen Generic drug: insulin degludec 72 units per day seems very high for you.  Please follow up with your outpatient provider for further adjustment. What changed:   how much to take  how to take this  when to take this  additional instructions Notes to patient: Follow instructions        Follow-up Information    Lamar Blinks, MD. Go on 07/04/2020.   Specialty: Cardiology Why: to receive your cardiac monitor. Contact information: [address] [facility] [place] [address] [phone]        Emogene Morgan, MD. Schedule an appointment as soon as possible for a visit in 1 week(s).   Specialty: Family Medicine Contact information: [address] [place] [address] [phone]               Allergies  Allergen Reactions  . Penicillins Anaphylaxis    Has patient had a PCN reaction causing immediate rash, facial/tongue/throat swelling, SOB or lightheadedness with hypotension: Yes Has patient had a PCN reaction causing severe rash involving mucus membranes or skin necrosis: No Has patient had a PCN reaction that required hospitalization No Has patient had a PCN reaction occurring within the last 10 years: No If all of the above answers are "NO", then may proceed with Cephalosporin use.   [name] Kitchen Penicillins Anaphylaxis  . Ace Inhibitors Swelling  . Ace Inhibitors Swelling     The results of significant  diagnostics from this hospitalization (including imaging, microbiology, ancillary and laboratory) are listed below for reference.   Consultations:   Procedures/Studies: DG Chest 2 View  Result Date: 07/04/2020 CLINICAL DATA:  Dyspnea, syncope EXAM: CHEST - 2 VIEW COMPARISON:  05/08/2019 FINDINGS: Lungs volumes are small, but are symmetric and are clear. No pneumothorax or pleural effusion. Cardiac size within normal limits. Pulmonary vascularity is normal. Osseous structures are age-appropriate. No acute bone abnormality. IMPRESSION: No active cardiopulmonary disease. Electronically Signed   By: Helyn Numbers MD   On: 07/04/2020 00:45   CT Head Wo Contrast  Result Date: 07/04/2020 CLINICAL DATA:  Mental status changes EXAM: CT HEAD WITHOUT CONTRAST TECHNIQUE: Contiguous axial images were obtained from the base of the skull through the vertex without intravenous  contrast. COMPARISON:  10/09/2019 FINDINGS: Brain: No acute intracranial abnormality. Specifically, no hemorrhage, hydrocephalus, mass lesion, acute infarction, or significant intracranial injury. Vascular: No hyperdense vessel or unexpected calcification. Skull: No acute calvarial abnormality. Sinuses/Orbits: Visualized paranasal sinuses and mastoids clear. Orbital soft tissues unremarkable. Other: None IMPRESSION: No acute intracranial abnormality. Electronically Signed   By: Charlett Nose M.D.   On: 07/04/2020 00:33   US Carotid Bilateral  Result Date: 07/04/2020 CLINICAL DATA:  Syncope. EXAM: BILATERAL CAROTID DUPLEX ULTRASOUND TECHNIQUE: Wallace Cullens scale imaging, color Doppler and duplex ultrasound were performed of bilateral carotid and vertebral arteries in the neck. COMPARISON:  10/05/2010 FINDINGS: Criteria: Quantification of carotid stenosis is based on velocity parameters that correlate the residual internal carotid diameter with NASCET-based stenosis levels, using the diameter of the distal internal carotid lumen as the denominator for  stenosis measurement. The following velocity measurements were obtained: RIGHT ICA: 122/38 cm/sec CCA: 108/25 cm/sec SYSTOLIC ICA/CCA RATIO:  1.1 ECA: 132 cm/sec LEFT ICA: 126/44 cm/sec CCA: 141/24 cm/sec SYSTOLIC ICA/CCA RATIO:  0.9 ECA: 143 cm/sec RIGHT CAROTID ARTERY: Right carotid arteries are patent without significant plaque or stenosis. External carotid artery is patent with normal waveform. Normal waveforms and velocities in the internal carotid artery. RIGHT VERTEBRAL ARTERY: Antegrade flow and normal waveform in the right vertebral artery. LEFT CAROTID ARTERY: Left carotid arteries are patent without significant plaque or stenosis. External carotid artery is patent with normal waveform. Normal waveforms and velocities in the internal carotid artery. LEFT VERTEBRAL ARTERY: Antegrade flow and normal waveform in the left vertebral artery. Other: Mildly complex nodule in left thyroid lobe. This nodule measures up to 2.0 cm. IMPRESSION: 1. Carotid arteries patent without significant plaque or stenosis. 2. Patent vertebral arteries with antegrade flow. 3. Left thyroid nodule that measures approximately 2.0 cm. Recommend thyroid US (ref: J Am Coll Radiol. 2015 Feb;12(2): 143-50). Electronically Signed   By: Richarda Overlie M.D.   On: 07/04/2020 12:09      Labs: BNP (last 3 results) Recent Labs    05/07/20 2300 05/09/20 0535 07/03/20 1728  BNP 18.7 114.1* 26.4   Basic Metabolic Panel: Recent Labs  Lab 07/03/20 1728 07/04/20 0508  NA 133* 140  K 4.2 3.7  CL 100 102  CO2 22 27  GLUCOSE 346* 196*  BUN 20 16  CREATININE 1.32* 0.95  CALCIUM 8.9 9.2   Liver Function Tests: No results for input(s): AST, ALT, ALKPHOS, BILITOT, PROT, ALBUMIN in the last 168 hours. No results for input(s): LIPASE, AMYLASE in the last 168 hours. No results for input(s): AMMONIA in the last 168 hours. CBC: Recent Labs  Lab 07/03/20 1728 07/04/20 0508  WBC 7.2 6.9  HGB 13.7 14.0  HCT 42.4 43.4  MCV 91.4 91.0   PLT 363 343   Cardiac Enzymes: No results for input(s): CKTOTAL, CKMB, CKMBINDEX, TROPONINI in the last 168 hours. BNP: Invalid input(s): POCBNP CBG: Recent Labs  Lab 07/04/20 0022 07/04/20 0233 07/04/20 0801 07/04/20 1140  GLUCAP 107* 100* 192* 182*   D-Dimer No results for input(s): DDIMER in the last 72 hours. Hgb A1c No results for input(s): HGBA1C in the last 72 hours. Lipid Profile No results for input(s): CHOL, HDL, LDLCALC, TRIG, CHOLHDL, LDLDIRECT in the last 72 hours. Thyroid function studies No results for input(s): TSH, T4TOTAL, T3FREE, THYROIDAB in the last 72 hours.  Invalid input(s): FREET3 Anemia work up No results for input(s): VITAMINB12, FOLATE, FERRITIN, TIBC, IRON, RETICCTPCT in the last 72 hours. Urinalysis    Component  Value Date/Time   COLORURINE YELLOW 11/09/2019 0126   APPEARANCEUR CLEAR 11/09/2019 0126   APPEARANCEUR Cloudy 11/12/2011 1721   LABSPEC 1.010 11/09/2019 0126   LABSPEC 1.010 11/12/2011 1721   PHURINE 6.0 11/09/2019 0126   GLUCOSEU 500 (A) 11/09/2019 0126   GLUCOSEU Negative 11/12/2011 1721   HGBUR NEGATIVE 11/09/2019 0126   BILIRUBINUR NEGATIVE 11/09/2019 0126   BILIRUBINUR Negative 11/12/2011 1721   KETONESUR NEGATIVE 11/09/2019 0126   PROTEINUR NEGATIVE 11/09/2019 0126   NITRITE NEGATIVE 11/09/2019 0126   LEUKOCYTESUR NEGATIVE 11/09/2019 0126   LEUKOCYTESUR 1+ 11/12/2011 1721   Sepsis Labs Invalid input(s): PROCALCITONIN,  WBC,  LACTICIDVEN Microbiology Recent Results (from the past 240 hour(s))  Respiratory Panel by RT PCR (Flu A&B, Covid) - Nasopharyngeal Swab     Status: None   Collection Time: 07/04/20 12:46 AM   Specimen: Nasopharyngeal Swab  Result Value Ref Range Status   SARS Coronavirus 2 by RT PCR NEGATIVE NEGATIVE Final    Comment: (NOTE) SARS-CoV-2 target nucleic acids are NOT DETECTED.  The SARS-CoV-2 RNA is generally detectable in upper respiratoy specimens during the acute phase of infection. The  lowest concentration of SARS-CoV-2 viral copies this assay can detect is 131 copies/mL. A negative result does not preclude SARS-Cov-2 infection and should not be used as the sole basis for treatment or other patient management decisions. A negative result may occur with  improper specimen collection/handling, submission of specimen other than nasopharyngeal swab, presence of viral mutation(s) within the areas targeted by this assay, and inadequate number of viral copies (<131 copies/mL). A negative result must be combined with clinical observations, patient history, and epidemiological information. The expected result is Negative.  Fact Sheet for Patients:  https://www.moore.com/  Fact Sheet for Healthcare Providers:  https://www.young.biz/  This test is no t yet approved or cleared by the [place] FDA and  has been authorized for detection and/or diagnosis of SARS-CoV-2 by FDA under an Emergency Use Authorization (EUA). This EUA will remain  in effect (meaning this test can be used) for the duration of the COVID-19 declaration under Section 564(b)(1) of the Act, 21 U.S.C. section 360bbb-3(b)(1), unless the authorization is terminated or revoked sooner.     Influenza A by PCR NEGATIVE NEGATIVE Final   Influenza B by PCR NEGATIVE NEGATIVE Final    Comment: (NOTE) The Xpert Xpress SARS-CoV-2/FLU/RSV assay is intended as an aid in  the diagnosis of influenza from Nasopharyngeal swab specimens and  should not be used as a sole basis for treatment. Nasal washings and  aspirates are unacceptable for Xpert Xpress SARS-CoV-2/FLU/RSV  testing.  Fact Sheet for Patients: https://www.moore.com/  Fact Sheet for Healthcare Providers: https://www.young.biz/  This test is not yet approved or cleared by the [place] FDA and  has been authorized for detection and/or diagnosis of SARS-CoV-2 by  FDA under  an Emergency Use Authorization (EUA). This EUA will remain  in effect (meaning this test can be used) for the duration of the  Covid-19 declaration under Section 564(b)(1) of the Act, 21  U.S.C. section 360bbb-3(b)(1), unless the authorization is  terminated or revoked. Performed at [facility], [address] [place], [place] [address]      Total time spend on discharging this patient, including the last patient exam, discussing the hospital stay, instructions for ongoing care as it relates to all pertinent caregivers, as well as preparing the medical discharge records, prescriptions, and/or referrals as applicable, is 45 minutes.    Darlin Priestly, MD  [facility] 07/04/2020, 2:19 PM  If 7PM-7AM, please contact night-coverage

## 2020-07-04 NOTE — ED Notes (Signed)
Patient transported to CT 

## 2020-07-04 NOTE — Progress Notes (Signed)
Anticoagulation monitoring(Lovenox):  54 yo female ordered Lovenox 40 mg Q24h  Filed Weights   07/03/20 1726  Weight: 104.3 kg (230 lb)   BMI 38.27    Lab Results  Component Value Date   CREATININE 1.32 (H) 07/03/2020   CREATININE 0.76 05/09/2020   CREATININE 0.98 05/08/2020   Estimated Creatinine Clearance: 57.7 mL/min (A) (by C-G formula based on SCr of 1.32 mg/dL (H)). Hemoglobin & Hematocrit     Component Value Date/Time   HGB 13.7 07/03/2020 1728   HGB 13.6 10/19/2019 1310   HCT 42.4 07/03/2020 1728   HCT 42.1 10/19/2019 1310     Per Protocol for Patient with estCrcl > 30 ml/min and BMI > 35, will transition to Lovenox 50 (0.5 mg/kg) mg Q24h.

## 2020-07-04 NOTE — Plan of Care (Signed)
Admitted pt from ED with syncope to room 258, Pt is a/ox4, MAE equally. Neuro check intact. Denies any major pain since admission to the unit. VSS,

## 2020-07-06 DIAGNOSIS — E669 Obesity, unspecified: Secondary | ICD-10-CM | POA: Diagnosis not present

## 2020-07-06 DIAGNOSIS — E1165 Type 2 diabetes mellitus with hyperglycemia: Secondary | ICD-10-CM | POA: Diagnosis not present

## 2020-07-06 DIAGNOSIS — I10 Essential (primary) hypertension: Secondary | ICD-10-CM | POA: Diagnosis not present

## 2020-07-06 DIAGNOSIS — E785 Hyperlipidemia, unspecified: Secondary | ICD-10-CM | POA: Diagnosis not present

## 2020-07-07 ENCOUNTER — Encounter: Payer: Self-pay | Admitting: *Deleted

## 2020-07-07 ENCOUNTER — Other Ambulatory Visit: Payer: Self-pay | Admitting: *Deleted

## 2020-07-07 NOTE — Patient Outreach (Signed)
[facility]) Care Management  07/07/2020  Payden Bonus 1965-09-25 [id number]   [facility] Telephone Assessment/Screen for referral  Referral Date:07/04/20 Referral Source: Referral Reason: Per notes on 07/04/20 patient reported she was unable to afford her medications most of the time Expensive medications identified are victoza, Jardinace, Breo,Latuda and Linzess Recurrent syncopal events related to medication    Insurance:  [place] and Medicaid [place] access Last admission 07/03/20- 07/04/20 for recurrent syncope, depression, HTN, CHF   Outreach attempt # 1 Patient is able to verify HIPAA, DOB and address Reviewed and addressed referral to [facility] with patient Consent: [facility] RN CM reviewed [facility] services with patient. Patient gave verbal consent for services [facility] telephonic RN CM.  Mrs Allaire was noted with delays and word finding for responses to assessment questions but able to answer when given time  Hypertension (HTN) She reports monitoring her BP at home since her discharge She reports elevations with systolic blood pressure (SBP) 269-485. She confirms and is noted to have had hypotensive episodes during admission with discontinued HTN medicines. She states this has happened before and she has been placed back on her HTN medicine by her MDs  She states she is on a low sodium diet but has experience some stress related to a recent funeral She is to follow up with her primary care provider (PCP) on 07/18/20 and see Dr [name], cardiologist  She is aware of the need to stay hydrated but reports she has been trying to drink her fluids to prevent dehydrated but it does not help with managing her BP. She reports "they are not sure why my blood pressure goes up and down"  Social: 54 year old disabled widowed patient living at home with family  Son is mentally ill accountant  Past Medical History:  Diagnosis Date  . ADD (attention deficit disorder)   . Anxiety   . Arthritis      knees, shoulder, upper back  . Asthma   . CFS (chronic fatigue syndrome)   . Chewing difficulty   . CHF (congestive heart failure) (HCC)   . Chronic kidney disease   . Constipation   . Depression   . Depression   . Diabetes mellitus without complication (HCC)   . Dyspnea   . Environmental allergies   . Fatty liver   . Fibromyalgia   . GERD (gastroesophageal reflux disease)   . GI bleed 10/02/2018  . Headache    migraines - 5x/mo  . Hypertension   . Hypokalemia 10/14/2017  . Hypothyroidism   . IBS (irritable bowel syndrome)   . Joint pain   . Lower extremity edema   . Major depressive disorder, single episode   . Migraine without aura and without status migrainosus, not intractable   . Migraines   . Motion sickness    ships  . Osteoarthritis   . Other specified disorders of thyroid   . Sleep apnea   . Stroke [facility])    no residual deficits  . Swallowing difficulty   . Thyroid nodule    bilateral and goiter  . Vitamin D deficiency   . Wears contact lenses   . Wears dentures    partial upper     Medications: Get meds through [facility] sometimes help with all medicines listed & Get some pill package through [place] She reports the beginning of the year is when she generally has cost concerns She was able to pay for all medicines at discharge on  07/04/20  She is able to verify her medications with [facility] RN CM  Provided education on the "donut whole"   Pharmacy referral completed- Referral from [facility] RN CM  Referral reason. Pt referred to [facility] on 07/04/20 for medication assistance after hospital discharge patient reported she was unable to afford her medications most of the time at the beginning of each year Expensive medications identified are victoza, Jardinace, Breo,Latuda and Linzess. Recurrent syncopal events related to medications. She reports taking over 30 medicines that she sometimes forgets to take. She gets some assist from [employer] clinic.  [employer] medicare/Medicaid [place] access patient. Was taken off HTN med at hospital for hypotension but since discharge BP elevated at home. See note and call if questions    DME  Glucometer, BP cuff shower stool (adapt) walker    Plan: [facility] RN CM will follow up with Mrs Roediger in the next 7-10 business days Pt encouraged to return a call to [facility] RN CM prn -office number left RN CM provided patient with [facility] 24 hr Nurse Line contact [phone]. Note to [facility] CMA (APL)  Goals      Patient Stated   .  [facility]) Manage My Medicine (pt-stated)      Follow Up Date 07/13/20   - keep a list of all the medicines I take; vitamins and herbals too      Notes:     .  Track and Manage My Blood Pressure (pt-stated)      Follow Up Date 07/13/20   - check blood pressure daily - choose a place to take my blood pressure (home, clinic or office, retail store)   Notes:        Carrina Schoenberger L. Noelle Penner, RN, BSN, CCM [facility] Telephonic Care Management Care Coordinator Office number [phone] Mobile number [phone]  Main [facility] number [phone] Fax number [phone]

## 2020-07-10 ENCOUNTER — Other Ambulatory Visit: Payer: Self-pay

## 2020-07-10 NOTE — Patient Outreach (Signed)
[facility]) Care Management  07/10/2020  Oceana Walthall 23-Jan-1966 [id number]   Received referral from Edd Arbour, RN for pharmacy assistance for this patient. Referral placed in proficient to [facility] pharmacy.  Domingo Cocking [facility] Care Management Assistant [phone]

## 2020-07-13 ENCOUNTER — Other Ambulatory Visit: Payer: Self-pay | Admitting: *Deleted

## 2020-07-13 NOTE — Patient Outreach (Addendum)
[facility]) Care Management  07/13/2020  Amanda [name] [birth year] [id number]   [facility] Telephone Assessment/Screen for referral  Referral Date:07/04/20 Referral Source: Referral Reason: Per notes on 07/04/20 patient reported she was unable to afford her medications most of the time Expensive medications identified are victoza, Jardinace, Breo,Latuda and Linzess Recurrent syncopal events related to medication  07/10/20 [facility] CMA confirmed on APL  Insurance:  [employer] and Medicaid [place] access Last admission 07/03/20- 07/04/20 for recurrent syncope, depression, HTN, CHF   [facility] Unsuccessful outreach   Outreach attempt to the home number  No answer. [facility] RN CM left HIPAA [facility] Portability and Accountability Act) compliant voicemail message along with CM's contact info.   Plan: [facility] RN CM scheduled this patient for another call attempt within 4-7 business days  [name], RN, BSN, CCM [facility] Telephonic Care Management Care Coordinator Office number [phone] Mobile number [phone]  Main [facility] number [phone] Fax number [phone]

## 2020-07-14 DIAGNOSIS — F411 Generalized anxiety disorder: Secondary | ICD-10-CM | POA: Diagnosis not present

## 2020-07-14 DIAGNOSIS — R69 Illness, unspecified: Secondary | ICD-10-CM | POA: Diagnosis not present

## 2020-07-14 DIAGNOSIS — F4312 Post-traumatic stress disorder, chronic: Secondary | ICD-10-CM | POA: Diagnosis not present

## 2020-07-14 DIAGNOSIS — F900 Attention-deficit hyperactivity disorder, predominantly inattentive type: Secondary | ICD-10-CM | POA: Diagnosis not present

## 2020-07-18 DIAGNOSIS — G8929 Other chronic pain: Secondary | ICD-10-CM | POA: Diagnosis not present

## 2020-07-18 DIAGNOSIS — I10 Essential (primary) hypertension: Secondary | ICD-10-CM | POA: Diagnosis not present

## 2020-07-18 DIAGNOSIS — E1165 Type 2 diabetes mellitus with hyperglycemia: Secondary | ICD-10-CM | POA: Diagnosis not present

## 2020-07-19 ENCOUNTER — Other Ambulatory Visit: Payer: Self-pay

## 2020-07-19 ENCOUNTER — Emergency Department
Admission: EM | Admit: 2020-07-19 | Discharge: 2020-07-19 | Disposition: A | Discharge status: Home / Self Care | Payer: Medicare HMO | Attending: Emergency Medicine | Admitting: Emergency Medicine

## 2020-07-19 ENCOUNTER — Other Ambulatory Visit: Payer: Self-pay | Admitting: *Deleted

## 2020-07-19 ENCOUNTER — Encounter: Payer: Self-pay | Admitting: Emergency Medicine

## 2020-07-19 DIAGNOSIS — I5033 Acute on chronic diastolic (congestive) heart failure: Secondary | ICD-10-CM | POA: Diagnosis not present

## 2020-07-19 DIAGNOSIS — E119 Type 2 diabetes mellitus without complications: Secondary | ICD-10-CM | POA: Diagnosis not present

## 2020-07-19 DIAGNOSIS — J449 Chronic obstructive pulmonary disease, unspecified: Secondary | ICD-10-CM | POA: Diagnosis not present

## 2020-07-19 DIAGNOSIS — Z794 Long term (current) use of insulin: Secondary | ICD-10-CM | POA: Diagnosis not present

## 2020-07-19 DIAGNOSIS — I110 Hypertensive heart disease with heart failure: Secondary | ICD-10-CM | POA: Diagnosis not present

## 2020-07-19 DIAGNOSIS — E039 Hypothyroidism, unspecified: Secondary | ICD-10-CM | POA: Insufficient documentation

## 2020-07-19 DIAGNOSIS — Z79899 Other long term (current) drug therapy: Secondary | ICD-10-CM | POA: Diagnosis not present

## 2020-07-19 DIAGNOSIS — R0902 Hypoxemia: Secondary | ICD-10-CM | POA: Diagnosis not present

## 2020-07-19 DIAGNOSIS — R55 Syncope and collapse: Secondary | ICD-10-CM | POA: Insufficient documentation

## 2020-07-19 DIAGNOSIS — I959 Hypotension, unspecified: Secondary | ICD-10-CM | POA: Diagnosis not present

## 2020-07-19 DIAGNOSIS — E1165 Type 2 diabetes mellitus with hyperglycemia: Secondary | ICD-10-CM | POA: Diagnosis not present

## 2020-07-19 LAB — CBC WITH DIFFERENTIAL/PLATELET
Abs Immature Granulocytes: 0.02 10*3/uL (ref 0.00–0.07)
Basophils Absolute: 0 10*3/uL (ref 0.0–0.1)
Basophils Relative: 1 %
Eosinophils Absolute: 0.1 10*3/uL (ref 0.0–0.5)
Eosinophils Relative: 1 %
HCT: 36.8 % (ref 36.0–46.0)
Hemoglobin: 12 g/dL (ref 12.0–15.0)
Immature Granulocytes: 0 %
Lymphocytes Relative: 28 %
Lymphs Abs: 2 10*3/uL (ref 0.7–4.0)
MCH: 29.4 pg (ref 26.0–34.0)
MCHC: 32.6 g/dL (ref 30.0–36.0)
MCV: 90.2 fL (ref 80.0–100.0)
Monocytes Absolute: 0.5 10*3/uL (ref 0.1–1.0)
Monocytes Relative: 7 %
Neutro Abs: 4.6 10*3/uL (ref 1.7–7.7)
Neutrophils Relative %: 63 %
Platelets: 291 10*3/uL (ref 150–400)
RBC: 4.08 MIL/uL (ref 3.87–5.11)
RDW: 14.6 % (ref 11.5–15.5)
WBC: 7.2 10*3/uL (ref 4.0–10.5)
nRBC: 0 % (ref 0.0–0.2)

## 2020-07-19 LAB — COMPREHENSIVE METABOLIC PANEL
ALT: 34 U/L (ref 0–44)
AST: 18 U/L (ref 15–41)
Albumin: 3.4 g/dL — ABNORMAL LOW (ref 3.5–5.0)
Alkaline Phosphatase: 115 U/L (ref 38–126)
Anion gap: 9 (ref 5–15)
BUN: 21 mg/dL — ABNORMAL HIGH (ref 6–20)
CO2: 24 mmol/L (ref 22–32)
Calcium: 9.1 mg/dL (ref 8.9–10.3)
Chloride: 105 mmol/L (ref 98–111)
Creatinine, Ser: 1.09 mg/dL — ABNORMAL HIGH (ref 0.44–1.00)
GFR, Estimated: 60 mL/min — ABNORMAL LOW (ref >60)
Glucose, Bld: 267 mg/dL — ABNORMAL HIGH (ref 70–99)
Potassium: 4.1 mmol/L (ref 3.5–5.1)
Sodium: 138 mmol/L (ref 135–145)
Total Bilirubin: 0.4 mg/dL (ref 0.3–1.2)
Total Protein: 6.8 g/dL (ref 6.5–8.1)

## 2020-07-19 LAB — TROPONIN I (HIGH SENSITIVITY)
Troponin I (High Sensitivity): <2 ng/L (ref <18)
Troponin I (High Sensitivity): <2 ng/L (ref <18)

## 2020-07-19 LAB — CBG MONITORING, ED: Glucose-Capillary: 245 mg/dL — ABNORMAL HIGH (ref 70–99)

## 2020-07-19 MED ORDER — SODIUM CHLORIDE 0.9 % IV BOLUS
500.0000 mL | Freq: Once | INTRAVENOUS | Status: AC
  Administered 2020-07-19: 500 mL via INTRAVENOUS

## 2020-07-19 NOTE — ED Triage Notes (Signed)
Pt arrived via EMS from home. Per EMS, pt found 30 mins ago by daughter very weak with episodes of LOC.   Per EMS, initial BP was 90/46, CBG 352.  Last VS per EMS - HR 67, 103/64, 95% RA.   Pt received 500 mL NS with EMS, and pt reports feeling a little better at this time

## 2020-07-19 NOTE — ED Notes (Signed)
Pt given EMS number to file complaint

## 2020-07-19 NOTE — Patient Outreach (Signed)
[facility]) Care Management  07/19/2020  Amanda [name] October 01, 1965 [id number]   [facility] Care coordination Patient noted hospitalized   Amanda Amanda [name] was referred to [facility] on 07/04/20 by MD  Referral Reason:Per notes on 07/04/20 patient reported she was unable to afford her medications most of the time Expensive medications identified are victoza, Jardinace, Breo,Latuda and Linzess Recurrent syncopal events related to medication 07/10/20 [facility] CMA confirmed on APL  Insurance:[employer] Medicare and Medicaid [place] access Last admission/ED 07/19/20 ED for syncope, loss of consciousness, hypotensin .kgsb 07/03/20- 07/04/20 for recurrent syncope, depression, HTN, CHF  Amanda [name] is noted to be in the ED at the time of the scheduled outreach Review of Epic chart information completed  Plan [facility] RN CM will follow up with patient pending discharge date   Amanda [name] L. Amanda Penner, RN, BSN, CCM [facility] Telephonic Care Management Care Coordinator Office number [phone] Mobile number [phone]  Main [facility] number [phone] Fax number [phone]

## 2020-07-19 NOTE — ED Provider Notes (Signed)
[facility] Emergency Department Provider Note ____________________________________________   First MD Initiated Contact with Patient 07/19/20 [phone]     (approximate)  I have reviewed the triage vital signs and the nursing notes.   HISTORY  Chief Complaint Hypotension and Hyperglycemia    HPI Rhemi Balbach is a 54 y.o. female with PMH as noted below including hypertension, diabetes, and CHF who presents with syncope this morning.  EMS was called by family who stated that the patient was in and out of consciousness.  The patient states that she felt like she syncopized several times.  She states that this has happened before when her blood pressure has been low.  Her blood pressure has been labile and she has had a few prior episodes where it has gone down, causing her to pass out.  She was recently taken off of several blood pressure medications because of this, but then was subsequently restarted on them.  The patient states that she also has difficulty checking her blood sugar, however it has recently been high.    EMS reports a systolic blood pressure in the 90s.   Past Medical History:  Diagnosis Date  . ADD (attention deficit disorder)   . Anxiety   . Arthritis    knees, shoulder, upper back  . Asthma   . CFS (chronic fatigue syndrome)   . Chewing difficulty   . CHF (congestive heart failure) (HCC)   . Chronic kidney disease   . Constipation   . Depression   . Depression   . Diabetes mellitus without complication (HCC)   . Dyspnea   . Environmental allergies   . Fatty liver   . Fibromyalgia   . GERD (gastroesophageal reflux disease)   . GI bleed 10/02/2018  . Headache    migraines - 5x/mo  . Hypertension   . Hypokalemia 10/14/2017  . Hypothyroidism   . IBS (irritable bowel syndrome)   . Joint pain   . Lower extremity edema   . Major depressive disorder, single episode   . Migraine without aura and without status migrainosus, not  intractable   . Migraines   . Motion sickness    ships  . Osteoarthritis   . Other specified disorders of thyroid   . Sleep apnea   . Stroke [facility])    no residual deficits  . Swallowing difficulty   . Thyroid nodule    bilateral and goiter  . Vitamin D deficiency   . Wears contact lenses   . Wears dentures    partial upper    Patient Active Problem List   Diagnosis Date Noted  . Hypoglycemia 07/04/2020  . AKI (acute kidney injury) (HCC) 07/04/2020  . Dehydration 07/04/2020  . COPD (chronic obstructive pulmonary disease) (HCC) 07/04/2020  . Chest pain 05/07/2020  . Hypotension 05/07/2020  . Recurrent syncope 05/07/2020  . Type 2 diabetes mellitus without complication (HCC) 05/07/2020  . Asthma 05/07/2020  . Chronic congestive heart failure (HCC) 05/07/2020  . Epilepsy (HCC) 01/12/2020  . Vitamin D deficiency 01/12/2020  . Class 2 severe obesity with serious comorbidity and body mass index (BMI) of 36.0 to 36.9 in adult [facility]) 01/12/2020  . Syncope 08/27/2019  . Syncope and collapse 08/26/2019  . Asthma 07/13/2019  . Hypotension 10/10/2018  . Acute on chronic diastolic (congestive) heart failure (HCC) 07/17/2018  . Acute on chronic heart failure (HCC) 07/08/2018  . Chronic diastolic heart failure (HCC) 04/17/2018  . Lymphedema 04/17/2018  . Chest pain 03/28/2018  .  Hypertension 10/15/2017  . Wears dentures 10/15/2017  . Symptomatic cholelithiasis 04/02/2017  . Recurrent incisional hernia 03/17/2017  . Calculus of gallbladder without cholecystitis without obstruction 03/17/2017  . Helicobacter pylori gastritis 03/17/2017  . Depression 09/28/2016  . Diabetes mellitus, type 2 (HCC) 09/28/2016  . Tremor 07/01/2013  . Esophageal reflux 06/22/2012  . Major depressive disorder, single episode 08/22/2009  . Sleep apnea 05/30/2009  . Hyperlipidemia 08/10/2008    Past Surgical History:  Procedure Laterality Date  . ABDOMINAL HYSTERECTOMY  2000's  . ABDOMINAL HYSTERECTOMY     . ABDOMINAL SURGERY     laparoscopy x4 with lysis of adhesions  . APPENDECTOMY  1986  . CESAREAN SECTION  1992  . CHOLECYSTECTOMY N/A 04/03/2017   Procedure: LAPAROSCOPIC CHOLECYSTECTOMY;  Surgeon: Henrene Dodge, MD;  Location: [facility] ORS;  Service: General;  Laterality: N/A;  . COLONOSCOPY N/A 10/06/2018   Procedure: COLONOSCOPY;  Surgeon: Toney Reil, MD;  Location: [facility] ENDOSCOPY;  Service: Gastroenterology;  Laterality: N/A;  . COLONOSCOPY WITH PROPOFOL N/A 03/10/2017   Procedure: COLONOSCOPY WITH PROPOFOL;  Surgeon: Scot Jun, MD;  Location: [facility] ENDOSCOPY;  Service: Endoscopy;  Laterality: N/A;  . ECTOPIC PREGNANCY SURGERY  2000's  . ESOPHAGOGASTRODUODENOSCOPY (EGD) WITH PROPOFOL N/A 03/10/2017   Procedure: ESOPHAGOGASTRODUODENOSCOPY (EGD) WITH PROPOFOL;  Surgeon: Scot Jun, MD;  Location: [facility] ENDOSCOPY;  Service: Endoscopy;  Laterality: N/A;  . HERNIA REPAIR    . JOINT REPLACEMENT Right 12/16/12   knee- medial - makoplasty  . KNEE ARTHROSCOPY Right 2006   partial medial and lateral meniscectomies  . KNEE ARTHROSCOPY Right 10/06/2015   Procedure: RIGHT KNEE ARTHROSCOPY WITH DEBRIDEMENT;  Surgeon: Erin Sons, MD;  [facility];  Service: Orthopedics;  Laterality: Right;  Diabetic - insulin and oral meds  . ROTATOR CUFF REPAIR Left 2006  . TUBAL LIGATION  2000's    Prior to Admission medications   Medication Sig Start Date End Date Taking? Authorizing Provider  B-D UF III MINI PEN NEEDLES 31G X 5 MM MISC Inject into the skin daily. 03/18/20   [provider]  BD PEN NEEDLE MICRO U/F 32G X 6 MM MISC Inject into the skin. 06/14/20   [provider]  cholecalciferol (VITAMIN D) 25 MCG (1000 UNIT) tablet Take 1,000 Units by mouth daily. 05/01/20   [provider]  DULoxetine (CYMBALTA) 30 MG capsule Take 30 mg by mouth daily.  06/07/20   [provider]  HUMALOG KWIKPEN 100 UNIT/ML KwikPen SMARTSIG:10 Unit(s) SUB-Q  05/19/20   [provider]  hydrOXYzine (ATARAX/VISTARIL) 25 MG tablet Take 25 mg by mouth 2 (two) times daily. 06/06/20   [provider]  INPEN 100-GRAY-LILLY DEVI as directed. 06/13/20   [provider]  JARDIANCE 25 MG TABS tablet Take 25 mg by mouth daily. 03/18/20   [provider]  LATUDA 40 MG TABS tablet Take 40 mg by mouth at bedtime. 04/12/20   [provider]  LINZESS 290 MCG CAPS capsule Take 290 mcg by mouth daily. 04/12/20   [provider]  liraglutide (VICTOZA) 18 MG/3ML SOPN Inject 0.5 mLs (3 mg total) into the skin daily. 01/11/20   [place], Thermon Leyland, FNP  losartan (COZAAR) 50 MG tablet Hold until followup with outpatient doctor because your blood pressure was low on presentation to the hospital. 07/04/20   Darlin Priestly, MD  mirtazapine (REMERON SOL-TAB) 30 MG disintegrating tablet Take 30 mg by mouth at bedtime as needed (Nap).  03/18/18   [provider]  montelukast (SINGULAIR) 10 MG tablet Take 10 mg by mouth daily. 04/12/20   [provider]  NOVOLOG FLEXPEN 100 UNIT/ML FlexPen Inject 10 Units into the skin daily. 05/22/20   [provider]  omeprazole (PRILOSEC) 40 MG capsule Take 40 mg by mouth daily. 04/12/20   [provider]  potassium chloride SA (KLOR-CON) 20 MEQ tablet Take 20 mEq by mouth daily. 04/12/20   [provider]  pravastatin (PRAVACHOL) 40 MG tablet Take 40 mg by mouth daily. 04/12/20   [provider]  pregabalin (LYRICA) 150 MG capsule Take 150 mg by mouth 3 (three) times daily. 04/12/20   [provider]  PROAIR RESPICLICK 108 (90 Base) MCG/ACT AEPB Inhale 2 puffs into the lungs every 4 (four) hours as needed. 11/12/19   [provider]  propranolol ER (INDERAL LA) 80 MG 24 hr capsule Take 80 mg by mouth daily. 04/12/20   [provider]  tiZANidine (ZANAFLEX) 4 MG tablet Take 4 mg by mouth every 8 (eight) hours as needed for muscle spasms.      [provider]  topiramate (TOPAMAX) 100 MG tablet Take 100 mg by mouth 2 (two) times daily. 04/12/20   [provider]  TRESIBA FLEXTOUCH 200 UNIT/ML FlexTouch Pen 72 units per day seems very high for you.  Please follow up with your outpatient provider for further adjustment. 07/04/20   Darlin PriestlyLai, Tina, MD  TRUEPLUS PEN NEEDLES 31G X 6 MM MISC  05/22/20   [provider]    Allergies Penicillins, Penicillins, Ace inhibitors, and Ace inhibitors  Family History  Problem Relation Age of Onset  . Hypertension Mother   . Diabetes Mother   . Heart disease Mother   . Depression Mother   . Obesity Mother   . Hypertension Father   . Diabetes Father   . Allergies Father   . Kidney disease Father   . Cancer Father   . Breast cancer Neg Hx     Social History Social History   Tobacco Use  . Smoking status: Never Smoker  . Smokeless tobacco: Never Used  Vaping Use  . Vaping Use: Never used  Substance Use Topics  . Alcohol use: Not Currently  . Drug use: Never    Review of Systems  Constitutional: No fever. Eyes: No redness. ENT: No sore throat. Cardiovascular: Denies chest pain. Respiratory: Denies shortness of breath. Gastrointestinal: No vomiting or diarrhea.  Genitourinary: Negative for dysuria.  Musculoskeletal: Positive for chronic joint pain. Skin: Negative for rash. Neurological: Negative for headache.   ____________________________________________   PHYSICAL EXAM:  VITAL SIGNS: ED Triage Vitals  Enc Vitals Group     BP 07/19/20 0745 113/72     Pulse Rate 07/19/20 0745 72     Resp 07/19/20 0745 (!) 22     Temp 07/19/20 0745 97.7 F (36.5 C)     Temp Source 07/19/20 0745 Oral     SpO2 07/19/20 0744 96 %     Weight 07/19/20 0745 220 lb (99.8 kg)     Height 07/19/20 0745 5\' 5"  (1.651 m)     Head Circumference --      Peak Flow --      Pain Score 07/19/20 0749 0     Pain Loc --      Pain Edu? --      Excl. in GC? --      Constitutional: Alert and oriented.  Weak appearing but in no acute distress. Eyes: Conjunctivae  are normal.  EOMI.  PERRLA. Head: Atraumatic. Nose: No congestion/rhinnorhea. Mouth/Throat: Mucous membranes are slightly dry.   Neck: Normal range of motion.  Cardiovascular: Normal rate, regular rhythm. Grossly normal heart sounds.  Good peripheral circulation. Respiratory: Normal respiratory effort.  No retractions. Lungs CTAB. Gastrointestinal: Soft and nontender. No distention.  Genitourinary: No flank tenderness. Musculoskeletal: No lower extremity edema.  Extremities warm and well perfused.  Neurologic:  Normal speech and language. No gross focal neurologic deficits are appreciated.  Skin:  Skin is warm and dry. No rash noted. Psychiatric: Mood and affect are normal. Speech and behavior are normal.  ____________________________________________   LABS (all labs ordered are listed, but only abnormal results are displayed)  Labs Reviewed  COMPREHENSIVE METABOLIC PANEL - Abnormal; Notable for the following components:      Result Value   Glucose, Bld 267 (*)    BUN 21 (*)    Creatinine, Ser 1.09 (*)    Albumin 3.4 (*)    GFR, Estimated 60 (*)    All other components within normal limits  CBG MONITORING, ED - Abnormal; Notable for the following components:   Glucose-Capillary 245 (*)    All other components within normal limits  CBC WITH DIFFERENTIAL/PLATELET  TROPONIN I (HIGH SENSITIVITY)  TROPONIN I (HIGH SENSITIVITY)   ____________________________________________  EKG  ED ECG REPORT I, Dionne Bucy, the attending physician, personally viewed and interpreted this ECG.  Date: 07/19/2020 EKG Time: 0812 Rate: 79 Rhythm: normal sinus rhythm QRS Axis: normal Intervals: normal ST/T Wave abnormalities: normal Narrative Interpretation: no evidence of acute  ischemia  ____________________________________________  RADIOLOGY    ____________________________________________   PROCEDURES  Procedure(s) performed: No  Procedures  Critical Care performed: No ____________________________________________   INITIAL IMPRESSION / ASSESSMENT AND PLAN / ED COURSE  Pertinent labs & imaging results that were available during my care of the patient were reviewed by me and considered in my medical decision making (see chart for details).  54 year old female with PMH as noted above including hypertension, CHF, diabetes presents with an episode of recurrent syncope associated with hypotension.  I reviewed the past medical records in Epic.  The patient was most recently admitted and discharged on 11/2 with a similar episode of syncope while in a chair.  The patient is on multiple antihypertensives and sedating medications.  She had mild AKI, and her losartan was held.  Previously she was also admitted in September with syncope.  On exam currently, the patient is tired appearing but alert and oriented.  Her vital signs are currently normal.  Neurologic exam is nonfocal.  The physical exam is otherwise unremarkable.  Overall I suspect recurrent syncopal episode likely due to medications, possibly her antihypertensives versus some component of the medications which can cause sedation such as topiramate and Lyrica.  We will obtain labs, give a fluid bolus, and monitor the patient for next few hours.  ----------------------------------------- 11:49 AM on 07/19/2020 -----------------------------------------  The patient's blood pressure has remained stable for the last 4 hours that she has been in the emergency department.  On reassessment, she appears comfortable and states that she is feeling better.  Her lab work-up is reassuring, with no significant acute findings.  Based on the negative work-up, the stable blood pressure, and the patient's well  appearance, I do not think that there is any benefit from another admission or syncope work-up.  These recurrent episodes are most likely related to her medications.  I counseled her on the results of the work-up  and the plan of care.  I emphasized the importance of staying in close touch with her doctors and diligence with her medications given the number of different medications she is on.  I gave her thorough return precautions, she expressed understanding.  ____________________________________________   FINAL CLINICAL IMPRESSION(S) / ED DIAGNOSES  Final diagnoses:  Syncope, unspecified syncope type      NEW MEDICATIONS STARTED DURING THIS VISIT:  New Prescriptions   No medications on file     Note:  This document was prepared using Dragon voice recognition software and may include unintentional dictation errors.    Dionne Bucy, MD 07/19/20 1150

## 2020-07-19 NOTE — ED Notes (Signed)
EDP Siadecki at bedside at this time 

## 2020-07-19 NOTE — ED Notes (Signed)
Pt given a cup of ice and warm blanket at this time per request

## 2020-07-19 NOTE — ED Notes (Signed)
This RN at bedside to go over discharge papers with pt. Pt expressing concern about phone being given to EMS but not given to hospital staff. Pt stating she wants ascom contacted about EMS drivers and missing phone. Charge, RN made aware.

## 2020-07-19 NOTE — Discharge Instructions (Addendum)
Continue take your medications as prescribed.  Follow-up with your primary care doctor and cardiologist.  Make sure you are drinking plenty of water throughout the day.  Return to the ER for new, worsening, or recurrent episodes of passing out, feeling confused or lightheaded, chest pain, difficulty breathing, severe headache, vomiting, weakness, or any other new or worsening symptoms that concern you.

## 2020-07-19 NOTE — ED Notes (Signed)
This RN called pt's phone, daughter answered. Daughter stating phone is with pt now

## 2020-07-24 ENCOUNTER — Other Ambulatory Visit: Payer: Self-pay | Admitting: *Deleted

## 2020-07-24 DIAGNOSIS — R002 Palpitations: Secondary | ICD-10-CM | POA: Diagnosis not present

## 2020-07-24 DIAGNOSIS — Z79899 Other long term (current) drug therapy: Secondary | ICD-10-CM | POA: Diagnosis not present

## 2020-07-24 DIAGNOSIS — Z888 Allergy status to other drugs, medicaments and biological substances status: Secondary | ICD-10-CM | POA: Diagnosis not present

## 2020-07-24 DIAGNOSIS — I10 Essential (primary) hypertension: Secondary | ICD-10-CM | POA: Diagnosis not present

## 2020-07-24 DIAGNOSIS — R55 Syncope and collapse: Secondary | ICD-10-CM | POA: Diagnosis not present

## 2020-07-24 DIAGNOSIS — R079 Chest pain, unspecified: Secondary | ICD-10-CM | POA: Diagnosis not present

## 2020-07-24 DIAGNOSIS — I509 Heart failure, unspecified: Secondary | ICD-10-CM | POA: Diagnosis not present

## 2020-07-24 DIAGNOSIS — Z880 Allergy status to penicillin: Secondary | ICD-10-CM | POA: Diagnosis not present

## 2020-07-24 DIAGNOSIS — K760 Fatty (change of) liver, not elsewhere classified: Secondary | ICD-10-CM | POA: Diagnosis not present

## 2020-07-24 NOTE — Patient Outreach (Signed)
[facility]) Care Management  07/24/2020  Amanda [name] August 02/12/1966 [id number]   [facility] unsuccessful outreach to MD referred patient  Amanda [name] was referred to [facility] on 07/04/20 by MD  Referral Reason:Per notes on 07/04/20 patient reported she was unable to afford her medications most of the time Expensive medications identified are victoza, Jardinace, Breo,Latuda and Linzess Recurrent syncopal events related to medication 07/10/20 [facility] CMA confirmed on APL  Insurance:[employer] Medicare and Medicaid [place] access Last admission/ED 07/19/20 ED for syncope, loss of consciousness, hypotensin .kgsb 07/03/20- 07/04/20 for recurrent syncope, depression, HTN, CHF  [facility]N Unsuccessful outreach   Outreach attempt to the home number  No answer. [facility] RN CM left HIPAA [facility] Portability and Accountability Act) compliant voicemail message along with CM's contact info.   Plan: [facility] RN CM scheduled this patient for another call attempt within 4-7 business days  Amanda Devery L. Noelle Penner, RN, BSN, CCM [facility] Telephonic Care Management Care Coordinator Office number [phone] Mobile number [phone]  Main [facility] number [phone] Fax number [phone]

## 2020-08-01 ENCOUNTER — Other Ambulatory Visit: Payer: Self-pay | Admitting: *Deleted

## 2020-08-01 ENCOUNTER — Other Ambulatory Visit: Payer: Self-pay

## 2020-08-01 ENCOUNTER — Encounter: Payer: Self-pay | Admitting: *Deleted

## 2020-08-01 NOTE — Patient Outreach (Signed)
[facility]) Care Management  08/01/2020  Roxana Lai 1965/09/29 [id number]  [facility] outreach to MD referred patient  Mrs Romi Rathel was referred to [facility] on 07/04/20 by MD Referral Reason:Per notes on 07/04/20 patient reported she was unable to afford her medications most of the time Expensive medications identified are victoza, Jardinace, Breo,Latuda and Linzess Recurrent syncopal events related to medication 07/10/20 [facility] CMA confirmed on APL  Insurance:[employer] Medicare and Medicaid [place] access Last admission/ED11/17/21 ED for syncope, loss of consciousness, hypotension 07/03/20- 07/04/20 for recurrent syncope, depression, HTN, CHF   Follow up assessment congestive Heart Failure (CHF) Followed up lasted ED visit Wt 230 lbs at MD office reports she does not have working scales at home She is working with Dr Earl Lites, dietitian at [facility] to decrease her weight She would prefer to try to lose about 50-60 lbs  [facility] RN CM requested [facility] CMA send patient scales via mail She is now wearing a heart monitored for 2 weeks per [facility] medical center [facility]) providers Discussed the balance of diuretic, fluid restriction and hypotension related to her medical diagnoses Previous history of visits to [facility]ariat[facility] but discontinued related to transportation after June 2021 after husband passed related to transportation   Transportation Reviewed and provided contact numbers for [employer] Access to care [phone] And Medicaid transportation [phone]   Provider follow up primary care provider (PCP), Dr Karie Fetch to be seen on 08/09/20  Presently does not have an endocrinologist to manage her diabetes and hypothyroidism CGM     Advance directive forms needed  [facility] RN CM requested these be sent to her in the mail via [facility] CMA  Medications Her medicines are delivered to her every 15 th of the month Outreach and review  of medications has been completed by [facility] central pharmacy staff [facility] RN CM will follow up with [facility] pharmacy staff for status of servicex   Social: 54 year old disabled widowed patient living at home with family  Son is mentally ill Accountant Spouse Dorinda Hill passed in June 2021   Plans [facility] RN CM will follow up with Mrs Corea within the next 30 business days  Pt encouraged to return a call to [facility] RN CM prn   Cala Bradford L. Noelle Penner, RN, BSN, CCM [facility] Telephonic Care Management Care Coordinator Office number [phone] Main [facility] number [phone] Fax number [phone]

## 2020-08-04 DIAGNOSIS — F411 Generalized anxiety disorder: Secondary | ICD-10-CM | POA: Diagnosis not present

## 2020-08-04 DIAGNOSIS — F315 Bipolar disorder, current episode depressed, severe, with psychotic features: Secondary | ICD-10-CM | POA: Diagnosis not present

## 2020-08-04 DIAGNOSIS — R69 Illness, unspecified: Secondary | ICD-10-CM | POA: Diagnosis not present

## 2020-08-04 DIAGNOSIS — F900 Attention-deficit hyperactivity disorder, predominantly inattentive type: Secondary | ICD-10-CM | POA: Diagnosis not present

## 2020-08-15 ENCOUNTER — Other Ambulatory Visit: Payer: Self-pay

## 2020-08-15 ENCOUNTER — Other Ambulatory Visit: Payer: Self-pay | Admitting: *Deleted

## 2020-08-15 NOTE — Patient Outreach (Signed)
[facility]) Care Management  08/15/2020  Amanda [name] Aug 07, 1966 [id number]   THNoutreach to MD referred patient Amanda [name] was referred to [facility] on 07/04/20 by MD Referral Reason:Per notes on 07/04/20 patient reported she was unable to afford her medications most of the time Expensive medications identified are victoza, Jardinace, Breo,Latuda and Linzess Recurrent syncopal events related to medication 07/10/20 [facility] CMA confirmed on APL  Insurance:[employer] Medicare and Medicaid [place] access Last admission/ED11/17/21 ED for syncope, loss of consciousness, hypotension 07/03/20- 07/04/20 for recurrent syncope, depression, HTN, CHF   Patient is able to verify HIPAA [facility] Portability and Accountability Act) identifiers Reviewed and addressed the purpose of the follow up call with the patient  Consent: [facility] RN CM reviewed [facility] services with patient. Patient gave verbal consent for services.  Follow up assessment Amanda [name] reports she is doing okay  She is presently not at home Daughter just got out hospital  Had to reschedule pcp appointment  She has not monitored her weight today She did receive the scale sent to her She does not believe her weight has decrease  She is attempting to increase her fluid intake as she stays dehydrated She has not found an endocrinologist at this time She is pending getting medications refilled  Plans  [facility] RN CM will follow up with Amanda [name] within the next 30 business days Pt encouraged to return a call to [facility] RN CM prn  Cala Bradford L. Noelle Penner, RN, BSN, CCM [facility] Telephonic Care Management Care Coordinator Office number [phone] Main [facility] number [phone] Fax number [phone]

## 2020-08-16 DIAGNOSIS — I10 Essential (primary) hypertension: Secondary | ICD-10-CM | POA: Diagnosis not present

## 2020-08-16 DIAGNOSIS — E1165 Type 2 diabetes mellitus with hyperglycemia: Secondary | ICD-10-CM | POA: Diagnosis not present

## 2020-08-16 DIAGNOSIS — E785 Hyperlipidemia, unspecified: Secondary | ICD-10-CM | POA: Diagnosis not present

## 2020-08-22 DIAGNOSIS — N179 Acute kidney failure, unspecified: Secondary | ICD-10-CM | POA: Diagnosis not present

## 2020-08-22 DIAGNOSIS — E049 Nontoxic goiter, unspecified: Secondary | ICD-10-CM | POA: Diagnosis not present

## 2020-08-22 DIAGNOSIS — E042 Nontoxic multinodular goiter: Secondary | ICD-10-CM | POA: Diagnosis not present

## 2020-08-22 DIAGNOSIS — E1165 Type 2 diabetes mellitus with hyperglycemia: Secondary | ICD-10-CM | POA: Diagnosis not present

## 2020-08-22 DIAGNOSIS — E1140 Type 2 diabetes mellitus with diabetic neuropathy, unspecified: Secondary | ICD-10-CM | POA: Diagnosis not present

## 2020-08-22 DIAGNOSIS — I951 Orthostatic hypotension: Secondary | ICD-10-CM | POA: Diagnosis not present

## 2020-08-22 DIAGNOSIS — R69 Illness, unspecified: Secondary | ICD-10-CM | POA: Diagnosis not present

## 2020-08-22 DIAGNOSIS — Z794 Long term (current) use of insulin: Secondary | ICD-10-CM | POA: Diagnosis not present

## 2020-08-22 DIAGNOSIS — I10 Essential (primary) hypertension: Secondary | ICD-10-CM | POA: Diagnosis not present

## 2020-09-01 DIAGNOSIS — I493 Ventricular premature depolarization: Secondary | ICD-10-CM | POA: Diagnosis not present

## 2020-09-01 DIAGNOSIS — Z0100 Encounter for examination of eyes and vision without abnormal findings: Secondary | ICD-10-CM | POA: Diagnosis not present

## 2020-09-01 DIAGNOSIS — I509 Heart failure, unspecified: Secondary | ICD-10-CM | POA: Diagnosis not present

## 2020-09-04 DIAGNOSIS — E1165 Type 2 diabetes mellitus with hyperglycemia: Secondary | ICD-10-CM | POA: Diagnosis not present

## 2020-09-07 DIAGNOSIS — U071 COVID-19: Secondary | ICD-10-CM | POA: Diagnosis not present

## 2020-09-11 DIAGNOSIS — U071 COVID-19: Secondary | ICD-10-CM | POA: Diagnosis not present

## 2020-09-12 ENCOUNTER — Inpatient Hospital Stay
Admission: EM | Admit: 2020-09-12 | Discharge: 2020-09-14 | DRG: 177 | Disposition: A | Discharge status: Home Health | Payer: Medicare HMO | Attending: Internal Medicine | Admitting: Internal Medicine

## 2020-09-12 ENCOUNTER — Emergency Department: Payer: Medicare HMO

## 2020-09-12 ENCOUNTER — Other Ambulatory Visit: Payer: Self-pay

## 2020-09-12 DIAGNOSIS — G4730 Sleep apnea, unspecified: Secondary | ICD-10-CM | POA: Diagnosis present

## 2020-09-12 DIAGNOSIS — Z6838 Body mass index (BMI) 38.0-38.9, adult: Secondary | ICD-10-CM | POA: Diagnosis not present

## 2020-09-12 DIAGNOSIS — Z6836 Body mass index (BMI) 36.0-36.9, adult: Secondary | ICD-10-CM | POA: Diagnosis not present

## 2020-09-12 DIAGNOSIS — R531 Weakness: Secondary | ICD-10-CM | POA: Diagnosis not present

## 2020-09-12 DIAGNOSIS — Z880 Allergy status to penicillin: Secondary | ICD-10-CM

## 2020-09-12 DIAGNOSIS — K219 Gastro-esophageal reflux disease without esophagitis: Secondary | ICD-10-CM | POA: Diagnosis present

## 2020-09-12 DIAGNOSIS — Z8249 Family history of ischemic heart disease and other diseases of the circulatory system: Secondary | ICD-10-CM

## 2020-09-12 DIAGNOSIS — E1165 Type 2 diabetes mellitus with hyperglycemia: Secondary | ICD-10-CM | POA: Diagnosis not present

## 2020-09-12 DIAGNOSIS — E119 Type 2 diabetes mellitus without complications: Secondary | ICD-10-CM

## 2020-09-12 DIAGNOSIS — J440 Chronic obstructive pulmonary disease with acute lower respiratory infection: Secondary | ICD-10-CM | POA: Diagnosis not present

## 2020-09-12 DIAGNOSIS — Z79890 Hormone replacement therapy (postmenopausal): Secondary | ICD-10-CM

## 2020-09-12 DIAGNOSIS — R404 Transient alteration of awareness: Secondary | ICD-10-CM | POA: Diagnosis not present

## 2020-09-12 DIAGNOSIS — K589 Irritable bowel syndrome without diarrhea: Secondary | ICD-10-CM | POA: Diagnosis present

## 2020-09-12 DIAGNOSIS — I110 Hypertensive heart disease with heart failure: Secondary | ICD-10-CM | POA: Diagnosis not present

## 2020-09-12 DIAGNOSIS — I9589 Other hypotension: Secondary | ICD-10-CM | POA: Diagnosis not present

## 2020-09-12 DIAGNOSIS — K760 Fatty (change of) liver, not elsewhere classified: Secondary | ICD-10-CM | POA: Diagnosis present

## 2020-09-12 DIAGNOSIS — E785 Hyperlipidemia, unspecified: Secondary | ICD-10-CM | POA: Diagnosis present

## 2020-09-12 DIAGNOSIS — J9601 Acute respiratory failure with hypoxia: Secondary | ICD-10-CM | POA: Diagnosis not present

## 2020-09-12 DIAGNOSIS — R6889 Other general symptoms and signs: Secondary | ICD-10-CM | POA: Diagnosis not present

## 2020-09-12 DIAGNOSIS — R0602 Shortness of breath: Secondary | ICD-10-CM | POA: Diagnosis not present

## 2020-09-12 DIAGNOSIS — F419 Anxiety disorder, unspecified: Secondary | ICD-10-CM | POA: Diagnosis present

## 2020-09-12 DIAGNOSIS — M797 Fibromyalgia: Secondary | ICD-10-CM | POA: Diagnosis present

## 2020-09-12 DIAGNOSIS — F909 Attention-deficit hyperactivity disorder, unspecified type: Secondary | ICD-10-CM | POA: Diagnosis present

## 2020-09-12 DIAGNOSIS — N179 Acute kidney failure, unspecified: Secondary | ICD-10-CM

## 2020-09-12 DIAGNOSIS — Z888 Allergy status to other drugs, medicaments and biological substances status: Secondary | ICD-10-CM

## 2020-09-12 DIAGNOSIS — E860 Dehydration: Secondary | ICD-10-CM | POA: Diagnosis present

## 2020-09-12 DIAGNOSIS — U071 COVID-19: Principal | ICD-10-CM | POA: Diagnosis present

## 2020-09-12 DIAGNOSIS — Z8673 Personal history of transient ischemic attack (TIA), and cerebral infarction without residual deficits: Secondary | ICD-10-CM | POA: Diagnosis not present

## 2020-09-12 DIAGNOSIS — J449 Chronic obstructive pulmonary disease, unspecified: Secondary | ICD-10-CM | POA: Diagnosis not present

## 2020-09-12 DIAGNOSIS — I951 Orthostatic hypotension: Secondary | ICD-10-CM | POA: Diagnosis present

## 2020-09-12 DIAGNOSIS — Z9049 Acquired absence of other specified parts of digestive tract: Secondary | ICD-10-CM

## 2020-09-12 DIAGNOSIS — E1140 Type 2 diabetes mellitus with diabetic neuropathy, unspecified: Secondary | ICD-10-CM | POA: Diagnosis not present

## 2020-09-12 DIAGNOSIS — E039 Hypothyroidism, unspecified: Secondary | ICD-10-CM | POA: Diagnosis present

## 2020-09-12 DIAGNOSIS — I509 Heart failure, unspecified: Secondary | ICD-10-CM

## 2020-09-12 DIAGNOSIS — J1282 Pneumonia due to coronavirus disease 2019: Secondary | ICD-10-CM

## 2020-09-12 DIAGNOSIS — I10 Essential (primary) hypertension: Secondary | ICD-10-CM | POA: Diagnosis present

## 2020-09-12 DIAGNOSIS — E669 Obesity, unspecified: Secondary | ICD-10-CM | POA: Diagnosis present

## 2020-09-12 DIAGNOSIS — M1990 Unspecified osteoarthritis, unspecified site: Secondary | ICD-10-CM | POA: Diagnosis present

## 2020-09-12 DIAGNOSIS — J189 Pneumonia, unspecified organism: Secondary | ICD-10-CM | POA: Diagnosis not present

## 2020-09-12 DIAGNOSIS — J9811 Atelectasis: Secondary | ICD-10-CM | POA: Diagnosis not present

## 2020-09-12 DIAGNOSIS — Z833 Family history of diabetes mellitus: Secondary | ICD-10-CM

## 2020-09-12 DIAGNOSIS — I517 Cardiomegaly: Secondary | ICD-10-CM | POA: Diagnosis not present

## 2020-09-12 DIAGNOSIS — Z794 Long term (current) use of insulin: Secondary | ICD-10-CM

## 2020-09-12 DIAGNOSIS — R0902 Hypoxemia: Secondary | ICD-10-CM | POA: Diagnosis present

## 2020-09-12 DIAGNOSIS — Z79899 Other long term (current) drug therapy: Secondary | ICD-10-CM

## 2020-09-12 DIAGNOSIS — I5032 Chronic diastolic (congestive) heart failure: Secondary | ICD-10-CM | POA: Diagnosis present

## 2020-09-12 DIAGNOSIS — R69 Illness, unspecified: Secondary | ICD-10-CM | POA: Diagnosis not present

## 2020-09-12 DIAGNOSIS — Z90710 Acquired absence of both cervix and uterus: Secondary | ICD-10-CM

## 2020-09-12 DIAGNOSIS — E66812 Obesity, class 2: Secondary | ICD-10-CM

## 2020-09-12 DIAGNOSIS — R4182 Altered mental status, unspecified: Secondary | ICD-10-CM | POA: Diagnosis not present

## 2020-09-12 DIAGNOSIS — Z743 Need for continuous supervision: Secondary | ICD-10-CM | POA: Diagnosis not present

## 2020-09-12 LAB — CBC WITH DIFFERENTIAL/PLATELET
Abs Immature Granulocytes: 0.03 10*3/uL (ref 0.00–0.07)
Basophils Absolute: 0 10*3/uL (ref 0.0–0.1)
Basophils Relative: 1 %
Eosinophils Absolute: 0 10*3/uL (ref 0.0–0.5)
Eosinophils Relative: 0 %
HCT: 38.1 % (ref 36.0–46.0)
Hemoglobin: 12.3 g/dL (ref 12.0–15.0)
Immature Granulocytes: 1 %
Lymphocytes Relative: 32 %
Lymphs Abs: 2.1 10*3/uL (ref 0.7–4.0)
MCH: 29 pg (ref 26.0–34.0)
MCHC: 32.3 g/dL (ref 30.0–36.0)
MCV: 89.9 fL (ref 80.0–100.0)
Monocytes Absolute: 0.6 10*3/uL (ref 0.1–1.0)
Monocytes Relative: 9 %
Neutro Abs: 3.7 10*3/uL (ref 1.7–7.7)
Neutrophils Relative %: 57 %
Platelets: 318 10*3/uL (ref 150–400)
RBC: 4.24 MIL/uL (ref 3.87–5.11)
RDW: 14.6 % (ref 11.5–15.5)
WBC: 6.5 10*3/uL (ref 4.0–10.5)
nRBC: 0 % (ref 0.0–0.2)

## 2020-09-12 LAB — COMPREHENSIVE METABOLIC PANEL
ALT: 39 U/L (ref 0–44)
AST: 32 U/L (ref 15–41)
Albumin: 3.7 g/dL (ref 3.5–5.0)
Alkaline Phosphatase: 110 U/L (ref 38–126)
Anion gap: 10 (ref 5–15)
BUN: 16 mg/dL (ref 6–20)
CO2: 27 mmol/L (ref 22–32)
Calcium: 9 mg/dL (ref 8.9–10.3)
Chloride: 98 mmol/L (ref 98–111)
Creatinine, Ser: 1.32 mg/dL — ABNORMAL HIGH (ref 0.44–1.00)
GFR, Estimated: 48 mL/min — ABNORMAL LOW (ref >60)
Glucose, Bld: 229 mg/dL — ABNORMAL HIGH (ref 70–99)
Potassium: 4.3 mmol/L (ref 3.5–5.1)
Sodium: 135 mmol/L (ref 135–145)
Total Bilirubin: 0.6 mg/dL (ref 0.3–1.2)
Total Protein: 6.9 g/dL (ref 6.5–8.1)

## 2020-09-12 LAB — TROPONIN I (HIGH SENSITIVITY)
Troponin I (High Sensitivity): 5 ng/L (ref <18)
Troponin I (High Sensitivity): 4 ng/L (ref <18)

## 2020-09-12 LAB — PROCALCITONIN: Procalcitonin: <0.1 ng/mL

## 2020-09-12 LAB — LACTIC ACID, PLASMA
Lactic Acid, Venous: 2.1 mmol/L (ref 0.5–1.9)
Lactic Acid, Venous: 1.5 mmol/L (ref 0.5–1.9)

## 2020-09-12 LAB — BRAIN NATRIURETIC PEPTIDE: B Natriuretic Peptide: 30 pg/mL (ref 0.0–100.0)

## 2020-09-12 MED ORDER — CARIPRAZINE HCL 3 MG PO CAPS
3.0000 mg | ORAL_CAPSULE | Freq: Every day | ORAL | Status: DC
  Administered 2020-09-13 – 2020-09-14 (×2): 3 mg via ORAL
  Filled 2020-09-12 (×3): qty 1

## 2020-09-12 MED ORDER — TIZANIDINE HCL 4 MG PO TABS
4.0000 mg | ORAL_TABLET | Freq: Three times a day (TID) | ORAL | Status: DC | PRN
  Administered 2020-09-13: 4 mg via ORAL
  Filled 2020-09-12 (×3): qty 1

## 2020-09-12 MED ORDER — PREDNISONE 50 MG PO TABS: 50.0000 mg | ORAL_TABLET | Freq: Every day | ORAL | Status: DC

## 2020-09-12 MED ORDER — TOPIRAMATE 100 MG PO TABS
100.0000 mg | ORAL_TABLET | Freq: Two times a day (BID) | ORAL | Status: DC
  Administered 2020-09-13 – 2020-09-14 (×4): 100 mg via ORAL
  Filled 2020-09-12 (×4): qty 1
  Filled 2020-09-12: qty 4

## 2020-09-12 MED ORDER — ENOXAPARIN SODIUM 40 MG/0.4ML ~~LOC~~ SOLN: 40.0000 mg | SUBCUTANEOUS | Status: DC

## 2020-09-12 MED ORDER — PANTOPRAZOLE SODIUM 40 MG PO TBEC
80.0000 mg | DELAYED_RELEASE_TABLET | Freq: Every day | ORAL | Status: DC
  Administered 2020-09-13 – 2020-09-14 (×2): 80 mg via ORAL
  Filled 2020-09-12 (×2): qty 2

## 2020-09-12 MED ORDER — TRAZODONE HCL 50 MG PO TABS: 25.0000 mg | ORAL_TABLET | Freq: Every evening | ORAL | Status: DC | PRN

## 2020-09-12 MED ORDER — MONTELUKAST SODIUM 10 MG PO TABS
10.0000 mg | ORAL_TABLET | Freq: Every day | ORAL | Status: DC
  Administered 2020-09-13: 10 mg via ORAL
  Filled 2020-09-12: qty 1

## 2020-09-12 MED ORDER — LOSARTAN POTASSIUM 50 MG PO TABS
50.0000 mg | ORAL_TABLET | Freq: Every day | ORAL | Status: DC
  Administered 2020-09-13 – 2020-09-14 (×2): 50 mg via ORAL
  Filled 2020-09-12 (×2): qty 1

## 2020-09-12 MED ORDER — IOHEXOL 350 MG/ML SOLN
100.0000 mL | Freq: Once | INTRAVENOUS | Status: AC | PRN
  Administered 2020-09-12: 100 mL via INTRAVENOUS

## 2020-09-12 MED ORDER — SODIUM CHLORIDE 0.45 % IV SOLN: INTRAVENOUS | Status: DC

## 2020-09-12 MED ORDER — METHYLPREDNISOLONE SODIUM SUCC 125 MG IJ SOLR
0.5000 mg/kg | Freq: Two times a day (BID) | INTRAMUSCULAR | Status: DC
  Administered 2020-09-13: 51.875 mg via INTRAVENOUS
  Filled 2020-09-12: qty 2

## 2020-09-12 MED ORDER — INSULIN DEGLUDEC 200 UNIT/ML ~~LOC~~ SOPN: 84.0000 [IU] | PEN_INJECTOR | Freq: Every day | SUBCUTANEOUS | Status: DC

## 2020-09-12 MED ORDER — PREGABALIN 75 MG PO CAPS
150.0000 mg | ORAL_CAPSULE | Freq: Three times a day (TID) | ORAL | Status: DC
  Administered 2020-09-13 – 2020-09-14 (×5): 150 mg via ORAL
  Filled 2020-09-12 (×5): qty 2

## 2020-09-12 MED ORDER — LORAZEPAM 0.5 MG PO TABS: 0.5000 mg | ORAL_TABLET | Freq: Two times a day (BID) | ORAL | Status: DC | PRN

## 2020-09-12 MED ORDER — POTASSIUM CHLORIDE CRYS ER 20 MEQ PO TBCR
20.0000 meq | EXTENDED_RELEASE_TABLET | Freq: Every day | ORAL | Status: DC
  Administered 2020-09-13 – 2020-09-14 (×2): 20 meq via ORAL
  Filled 2020-09-12 (×2): qty 1

## 2020-09-12 MED ORDER — LACTATED RINGERS IV BOLUS
1000.0000 mL | Freq: Once | INTRAVENOUS | Status: AC
  Administered 2020-09-12: 1000 mL via INTRAVENOUS

## 2020-09-12 MED ORDER — ALBUTEROL SULFATE 108 (90 BASE) MCG/ACT IN AEPB: 2.0000 | INHALATION_SPRAY | RESPIRATORY_TRACT | Status: DC | PRN

## 2020-09-12 MED ORDER — SENNA 8.6 MG PO TABS
1.0000 | ORAL_TABLET | Freq: Two times a day (BID) | ORAL | Status: DC
  Administered 2020-09-13 – 2020-09-14 (×3): 8.6 mg via ORAL
  Filled 2020-09-12 (×4): qty 1

## 2020-09-12 MED ORDER — PRAVASTATIN SODIUM 20 MG PO TABS
40.0000 mg | ORAL_TABLET | Freq: Every day | ORAL | Status: DC
  Administered 2020-09-13 – 2020-09-14 (×2): 40 mg via ORAL
  Filled 2020-09-12: qty 2
  Filled 2020-09-12: qty 1
  Filled 2020-09-12: qty 2

## 2020-09-12 MED ORDER — ALBUTEROL SULFATE HFA 108 (90 BASE) MCG/ACT IN AERS
2.0000 | INHALATION_SPRAY | Freq: Four times a day (QID) | RESPIRATORY_TRACT | Status: DC
  Administered 2020-09-13 – 2020-09-14 (×4): 2 via RESPIRATORY_TRACT
  Filled 2020-09-12: qty 6.7

## 2020-09-12 MED ORDER — DEXMETHYLPHENIDATE HCL ER 5 MG PO CP24
15.0000 mg | ORAL_CAPSULE | Freq: Every day | ORAL | Status: DC
  Administered 2020-09-13: 15 mg via ORAL
  Filled 2020-09-12: qty 3

## 2020-09-12 MED ORDER — INSULIN DETEMIR 100 UNIT/ML ~~LOC~~ SOLN
0.1500 [IU]/kg | Freq: Two times a day (BID) | SUBCUTANEOUS | Status: DC
  Administered 2020-09-13 – 2020-09-14 (×4): 16 [IU] via SUBCUTANEOUS
  Filled 2020-09-12 (×6): qty 0.16

## 2020-09-12 MED ORDER — METHYLPREDNISOLONE SODIUM SUCC 125 MG IJ SOLR
125.0000 mg | Freq: Once | INTRAMUSCULAR | Status: AC
  Administered 2020-09-12: 125 mg via INTRAVENOUS
  Filled 2020-09-12: qty 2

## 2020-09-12 MED ORDER — ACETAMINOPHEN 325 MG PO TABS: 650.0000 mg | ORAL_TABLET | Freq: Four times a day (QID) | ORAL | Status: DC | PRN

## 2020-09-12 MED ORDER — LINACLOTIDE 290 MCG PO CAPS
290.0000 ug | ORAL_CAPSULE | Freq: Every day | ORAL | Status: DC
  Administered 2020-09-13 – 2020-09-14 (×2): 290 ug via ORAL
  Filled 2020-09-12 (×3): qty 1

## 2020-09-12 MED ORDER — ZOLPIDEM TARTRATE 5 MG PO TABS: 10.0000 mg | ORAL_TABLET | Freq: Every evening | ORAL | Status: DC | PRN

## 2020-09-12 MED ORDER — INSULIN ASPART 100 UNIT/ML ~~LOC~~ SOLN
0.0000 [IU] | Freq: Three times a day (TID) | SUBCUTANEOUS | Status: DC
  Administered 2020-09-13 (×2): 4 [IU] via SUBCUTANEOUS
  Administered 2020-09-13: 15 [IU] via SUBCUTANEOUS
  Administered 2020-09-14: 4 [IU] via SUBCUTANEOUS
  Filled 2020-09-12 (×4): qty 1

## 2020-09-12 MED ORDER — FUROSEMIDE 20 MG PO TABS
20.0000 mg | ORAL_TABLET | Freq: Every day | ORAL | Status: DC
  Administered 2020-09-13 – 2020-09-14 (×2): 20 mg via ORAL
  Filled 2020-09-12 (×2): qty 1

## 2020-09-12 MED ORDER — LIRAGLUTIDE 18 MG/3ML ~~LOC~~ SOPN: 3.0000 mg | PEN_INJECTOR | Freq: Every day | SUBCUTANEOUS | Status: DC

## 2020-09-12 MED ORDER — LIDOCAINE 5 % EX PTCH
1.0000 | MEDICATED_PATCH | CUTANEOUS | Status: DC
  Administered 2020-09-13 – 2020-09-14 (×2): 1 via TRANSDERMAL
  Filled 2020-09-12 (×2): qty 1

## 2020-09-12 MED ORDER — DULOXETINE HCL 30 MG PO CPEP
30.0000 mg | ORAL_CAPSULE | Freq: Every day | ORAL | Status: DC
Start: 2020-09-13 — End: 2020-09-14
  Administered 2020-09-13 – 2020-09-14 (×2): 30 mg via ORAL
  Filled 2020-09-12 (×3): qty 1

## 2020-09-12 MED ORDER — MIRTAZAPINE 15 MG PO TBDP
30.0000 mg | ORAL_TABLET | Freq: Every evening | ORAL | Status: DC | PRN
Start: 2020-09-12 — End: 2020-09-14
  Administered 2020-09-13: 30 mg via ORAL
  Filled 2020-09-12 (×3): qty 2

## 2020-09-12 NOTE — ED Triage Notes (Signed)
Pt to ED via EMS from home. Per ems pts daughter called out due to lethargy. Pt states since shes had covid, dx 12/30, she has been weak and had problems with hypotension, pt states she still has generalized body aches and a cough, pt states she has been taking abx. Pt BP on arrival 70/57, pt alert and able to speak in complete sentences.

## 2020-09-12 NOTE — ED Provider Notes (Signed)
[facility] Emergency Department Provider Note  ____________________________________________   Event Date/Time   First MD Initiated Contact with Patient 09/12/20 1926     (approximate)  I have reviewed the triage vital signs    HISTORY  Chief Complaint Hypotension    HPI Amanda [name] is a 56 y.o. female who was diagnosed with COVID on 12/30 who comes in for weakness.  Patient presents today feeling continued body aches, cough, congestion, chest discomfort, fatigue.  She reports she was seen by her doctor yesterday and was prescribed antibiotics.  Unclear what they were.  On EMS arrival patient was 70/57.  Patient states that she is also having some shortness of breath, constant, worse with exertion, better at rest.  Denies history of PE, denies leg swelling.          Past Medical History:  Diagnosis Date  . ADD (attention deficit disorder)   . Anxiety   . Arthritis    knees, shoulder, upper back  . Asthma   . CFS (chronic fatigue syndrome)   . Chewing difficulty   . CHF (congestive heart failure) (HCC)   . Chronic kidney disease   . Constipation   . Depression   . Depression   . Diabetes mellitus without complication (HCC)   . Dyspnea   . Environmental allergies   . Fatty liver   . Fibromyalgia   . GERD (gastroesophageal reflux disease)   . GI bleed 10/02/2018  . Headache    migraines - 5x/mo  . Hypertension   . Hypokalemia 10/14/2017  . Hypothyroidism   . IBS (irritable bowel syndrome)   . Joint pain   . Lower extremity edema   . Major depressive disorder, single episode   . Migraine without aura and without status migrainosus, not intractable   . Migraines   . Motion sickness    ships  . Osteoarthritis   . Other specified disorders of thyroid   . Sleep apnea   . Stroke [facility])    no residual deficits  . Swallowing difficulty   . Thyroid nodule    bilateral and goiter  . Vitamin D deficiency   . Wears contact lenses   .  Wears dentures    partial upper    Patient Active Problem List   Diagnosis Date Noted  . Hypoglycemia 07/04/2020  . AKI (acute kidney injury) (HCC) 07/04/2020  . Dehydration 07/04/2020  . COPD (chronic obstructive pulmonary disease) (HCC) 07/04/2020  . Chest pain 05/07/2020  . Hypotension 05/07/2020  . Recurrent syncope 05/07/2020  . Type 2 diabetes mellitus without complication (HCC) 05/07/2020  . Asthma 05/07/2020  . Chronic congestive heart failure (HCC) 05/07/2020  . Epilepsy (HCC) 01/12/2020  . Vitamin D deficiency 01/12/2020  . Class 2 severe obesity with serious comorbidity and body mass index (BMI) of 36.0 to 36.9 in adult [facility]) 01/12/2020  . Syncope 08/27/2019  . Syncope and collapse 08/26/2019  . Asthma 07/13/2019  . Hypotension 10/10/2018  . Acute on chronic diastolic (congestive) heart failure (HCC) 07/17/2018  . Acute on chronic heart failure (HCC) 07/08/2018  . Chronic diastolic heart failure (HCC) 04/17/2018  . Lymphedema 04/17/2018  . Chest pain 03/28/2018  . Hypertension 10/15/2017  . Wears dentures 10/15/2017  . Symptomatic cholelithiasis 04/02/2017  . Recurrent incisional hernia 03/17/2017  . Calculus of gallbladder without cholecystitis without obstruction 03/17/2017  . Helicobacter pylori gastritis 03/17/2017  . Depression 09/28/2016  . Diabetes mellitus, type 2 (HCC) 09/28/2016  . Tremor 07/01/2013  .  Esophageal reflux 06/22/2012  . Major depressive disorder, single episode 08/22/2009  . Sleep apnea 05/30/2009  . Hyperlipidemia 08/10/2008    Past Surgical History:  Procedure Laterality Date  . ABDOMINAL HYSTERECTOMY  2000's  . ABDOMINAL HYSTERECTOMY    . ABDOMINAL SURGERY     laparoscopy x4 with lysis of adhesions  . APPENDECTOMY  1986  . CESAREAN SECTION  1992  . CHOLECYSTECTOMY N/A 04/03/2017   Procedure: LAPAROSCOPIC CHOLECYSTECTOMY;  Surgeon: Henrene DodgePiscoya, Jose, MD;  Location: [facility] ORS;  Service: General;  Laterality: N/A;  . COLONOSCOPY N/A  10/06/2018   Procedure: COLONOSCOPY;  Surgeon: Toney ReilVanga, Rohini Reddy, MD;  [facility];  Service: Gastroenterology;  Laterality: N/A;  . COLONOSCOPY WITH PROPOFOL N/A 03/10/2017   Procedure: COLONOSCOPY WITH PROPOFOL;  Surgeon: Scot JunElliott, Robert T, MD;  [facility];  Service: Endoscopy;  Laterality: N/A;  . ECTOPIC PREGNANCY SURGERY  2000's  . ESOPHAGOGASTRODUODENOSCOPY (EGD) WITH PROPOFOL N/A 03/10/2017   Procedure: ESOPHAGOGASTRODUODENOSCOPY (EGD) WITH PROPOFOL;  Surgeon: Scot JunElliott, Robert T, MD;  [facility];  Service: Endoscopy;  Laterality: N/A;  . HERNIA REPAIR    . JOINT REPLACEMENT Right 12/16/12   knee- medial - makoplasty  . KNEE ARTHROSCOPY Right 2006   partial medial and lateral meniscectomies  . KNEE ARTHROSCOPY Right 10/06/2015   Procedure: RIGHT KNEE ARTHROSCOPY WITH DEBRIDEMENT;  Surgeon: Erin SonsHarold Kernodle, MD;  [facility];  Service: Orthopedics;  Laterality: Right;  Diabetic - insulin and oral meds  . ROTATOR CUFF REPAIR Left 2006  . TUBAL LIGATION  2000's    Prior to Admission medications   Medication Sig Start Date End Date Taking? Authorizing Provider  Albuterol Sulfate (PROAIR RESPICLICK) 108 (90 Base) MCG/ACT AEPB Inhale into the lungs. 11/12/19   [provider]  Ascorbic Acid (VITAMIN C CR) 1000 MG TBCR Take 1 tablet by mouth daily.    [provider]  B-D UF III MINI PEN NEEDLES 31G X 5 MM MISC Inject into the skin daily. 03/18/20   [provider]  BD PEN NEEDLE MICRO U/F 32G X 6 MM MISC Inject into the skin. 06/14/20   [provider]  cariprazine (VRAYLAR) capsule Take 1 capsule by mouth daily. 07/17/20   [provider]  cholecalciferol (VITAMIN D) 25 MCG (1000 UNIT) tablet Take 1,000 Units by mouth daily. 05/01/20   [provider]  dexmethylphenidate (FOCALIN XR) 15 MG 24 hr capsule Take 1 capsule by mouth daily. 07/14/20   [provider]  DULoxetine  (CYMBALTA) 30 MG capsule Take 30 mg by mouth daily.  Patient not taking: Reported on 08/15/2020 06/07/20   [provider]  estradiol (ESTRACE) 0.1 MG/GM vaginal cream  10/15/19   [provider]  flunisolide (NASALIDE) 25 MCG/ACT (0.025%) SOLN Place into the nose.    [provider]  furosemide (LASIX) 20 MG tablet Take by mouth. 07/24/20   [provider]  HUMALOG KWIKPEN 100 UNIT/ML KwikPen SMARTSIG:10 Unit(s) SUB-Q Patient not taking: Reported on 08/15/2020 05/19/20   [provider]  hydrOXYzine (ATARAX/VISTARIL) 25 MG tablet Take 25 mg by mouth 2 (two) times daily. Patient not taking: Reported on 08/15/2020 06/06/20   [provider]  INPEN 100-GRAY-LILLY DEVI as directed. 06/13/20   [provider]  Insulin Pen Needle 31G X 6 MM MISC SMARTSIG:1 Needle SUB-Q 4 Times Daily 05/19/20   [provider]  ipratropium-albuterol (DUONEB) 0.5-2.5 (3) MG/3ML SOLN Inhale into the lungs.    [provider]  JARDIANCE 25  MG TABS tablet Take 25 mg by mouth daily. 03/18/20   [provider]  LATUDA 40 MG TABS tablet Take 40 mg by mouth at bedtime. Patient not taking: Reported on 08/15/2020 04/12/20   [provider]  lidocaine (LIDODERM) 5 % lidocaine 5 % topical patch  PLEASE SEE ATTACHED FOR DETAILED DIRECTIONS 08/16/19   [provider]  LINZESS 290 MCG CAPS capsule Take 290 mcg by mouth daily. 04/12/20   [provider]  liraglutide (VICTOZA) 18 MG/3ML SOPN Inject 0.5 mLs (3 mg total) into the skin daily. 01/11/20   Whitmire, Thermon Leyland, FNP  LORazepam (ATIVAN) 0.5 MG tablet Take 0.5 mg by mouth 2 (two) times daily as needed. 02/29/20   [provider]  losartan (COZAAR) 50 MG tablet Hold until followup with outpatient doctor because your blood pressure was low on presentation to the hospital. 07/04/20   Darlin Priestly, MD  mirtazapine (REMERON SOL-TAB) 30 MG disintegrating tablet Take 30 mg by  mouth at bedtime as needed (Nap).  03/18/18   [provider]  montelukast (SINGULAIR) 10 MG tablet Take 10 mg by mouth daily. 04/12/20   [provider]  NOVOLOG FLEXPEN 100 UNIT/ML FlexPen Inject 10 Units into the skin daily. 05/22/20   [provider]  NOVOLOG PENFILL cartridge Inject into the skin. 06/14/20   [provider]  omeprazole (PRILOSEC) 40 MG capsule Take 40 mg by mouth daily. 04/12/20   [provider]  potassium chloride SA (KLOR-CON) 20 MEQ tablet Take 20 mEq by mouth daily. 04/12/20   [provider]  pravastatin (PRAVACHOL) 40 MG tablet Take 40 mg by mouth daily. 04/12/20   [provider]  pregabalin (LYRICA) 150 MG capsule Take 150 mg by mouth 3 (three) times daily. 04/12/20   [provider]  PROAIR RESPICLICK 108 (90 Base) MCG/ACT AEPB Inhale 2 puffs into the lungs every 4 (four) hours as needed. 11/12/19   [provider]  propranolol (INNOPRAN XL) 80 MG 24 hr capsule Take by mouth. Patient not taking: Reported on 08/15/2020 07/24/20   [provider]  propranolol ER (INDERAL LA) 80 MG 24 hr capsule Take 80 mg by mouth daily. Patient not taking: Reported on 08/15/2020 04/12/20   [provider]  tiZANidine (ZANAFLEX) 4 MG tablet Take 4 mg by mouth every 8 (eight) hours as needed for muscle spasms.     [provider]  topiramate (TOPAMAX) 100 MG tablet Take 100 mg by mouth 2 (two) times daily. 04/12/20   [provider]  TRESIBA FLEXTOUCH 200 UNIT/ML FlexTouch Pen 72 units per day seems very high for you.  Please follow up with your outpatient provider for further adjustment. 07/04/20   Darlin Priestly, MD  TRUEPLUS PEN NEEDLES 31G X 6 MM MISC  05/22/20   [provider]  zolpidem (AMBIEN) 10 MG tablet  06/23/20   [provider]    Allergies Penicillins, Penicillins, Ace inhibitors, and Ace inhibitors  Family History  Problem Relation Age of Onset  .  Hypertension Mother   . Diabetes Mother   . Heart disease Mother   . Depression Mother   . Obesity Mother   . Hypertension Father   . Diabetes Father   . Allergies Father   . Kidney disease Father   . Cancer Father   . Breast cancer Neg Hx     Social History Social History   Tobacco Use  . Smoking status: Never Smoker  . Smokeless tobacco: Never  Used  Vaping Use  . Vaping Use: Never used  Substance Use Topics  . Alcohol use: Not Currently  . Drug use: Never      Review of Systems Constitutional: Chills, body aches, fatigue Eyes: No visual changes. ENT: No sore throat. Cardiovascular: Chest pain Respiratory: + SOB, cough Gastrointestinal: No abdominal pain.  No nausea, no vomiting.  No diarrhea.  No constipation. Genitourinary: Negative for dysuria. Musculoskeletal: Negative for back pain.  Body ache Skin: Negative for rash. Neurological: Negative for headaches, focal weakness or numbness. All other ROS negative ____________________________________________   PHYSICAL EXAM:  VITAL SIGNS: ED Triage Vitals  Enc Vitals Group     BP 09/12/20 1920 (!) 70/57     Pulse Rate 09/12/20 1920 87     Resp 09/12/20 1920 19     Temp 09/12/20 1920 97.7 F (36.5 C)     Temp Source 09/12/20 1920 Oral     SpO2 09/12/20 1920 (!) 84 %     Weight --      Height --      Head Circumference --      Peak Flow --      Pain Score 09/12/20 1915 8     Pain Loc --      Pain Edu? --      Excl. in GC? --     Constitutional: Alert and oriented. Appears fatigued Eyes: Conjunctivae are normal. EOMI. Head: Atraumatic. Nose: No congestion/rhinnorhea. Mouth/Throat: Mucous membranes are moist.   Neck: No stridor. Trachea Midline. FROM Cardiovascular: Normal rate, regular rhythm. Grossly normal heart sounds.  Good peripheral circulation. Respiratory: no audible stridor, mild increased work of breathing.  Patient was initially satting in the 80s so placed on 2 L of  oxygen. Gastrointestinal: Soft and nontender. No distention. No abdominal bruits.  Musculoskeletal: No lower extremity tenderness nor edema.  No joint effusions. Neurologic:  Normal speech and language. No gross focal neurologic deficits are appreciated.  Skin:  Skin is warm, dry and intact. No rash noted. Psychiatric: Mood and affect are normal. Speech and behavior are normal. GU: Deferred   ____________________________________________   LABS (all labs ordered are listed, but only abnormal results are displayed)  Labs Reviewed  COMPREHENSIVE METABOLIC PANEL - Abnormal; Notable for the following components:      Result Value   Glucose, Bld 229 (*)    Creatinine, Ser 1.32 (*)    GFR, Estimated 48 (*)    All other components within normal limits  LACTIC ACID, PLASMA - Abnormal; Notable for the following components:   Lactic Acid, Venous 2.1 (*)    All other components within normal limits  CULTURE, BLOOD (ROUTINE X 2)  CULTURE, BLOOD (ROUTINE X 2)  CBC WITH DIFFERENTIAL/PLATELET  BRAIN NATRIURETIC PEPTIDE  LACTIC ACID, PLASMA  PROCALCITONIN  PROCALCITONIN  TROPONIN I (HIGH SENSITIVITY)  TROPONIN I (HIGH SENSITIVITY)   ____________________________________________   ED ECG REPORT I, Concha SeMary E Nalaysia Manganiello, the attending physician, personally viewed and interpreted this ECG.  Normal sinus rate of 83, no ST elevation, no T wave inversions, normal intervals ____________________________________________  RADIOLOGY Vela ProseI, Denzil Mceachron E Kailie Polus, personally viewed and evaluated these images (plain radiographs) as part of my medical decision making, as well as reviewing the written report by the radiologist.  ED MD interpretation: No pneumonia noted  Official radiology report(s): CT Angio Chest PE W and/or Wo Contrast  Result Date: 09/12/2020 CLINICAL DATA:  PE suspected, lethargy weakness and hypotension, history of COVID infection diagnosed on December 30th.  EXAM: CT ANGIOGRAPHY CHEST WITH CONTRAST  TECHNIQUE: Multidetector CT imaging of the chest was performed using the standard protocol during bolus administration of intravenous contrast. Multiplanar CT image reconstructions and MIPs were obtained to evaluate the vascular anatomy. CONTRAST:  OMNIPAQUE IOHEXOL 350 MG/ML SOLN COMPARISON:  September of 2021 FINDINGS: Cardiovascular: Normal caliber thoracic aorta. Heart size mildly enlarged with signs of pericardial fluid similar to the previous study from September of 2021. Central pulmonary vasculature of normal caliber. Lung base assessment is limited by respiratory motion the subsegmental level. No central, lobar or segmental level embolus. Mediastinum/Nodes: Patulous esophagus. No adenopathy in the thoracic inlet. No mediastinal lymphadenopathy no thoracic inlet adenopathy no hilar adenopathy. Lungs/Pleura: Areas of ground-glass attenuation at the lung bases. Linear atelectatic changes. No dense consolidation. No sign of pleural effusion. Upper Abdomen: Incidental imaging of upper abdominal contents shows low-attenuation lesions in the liver that are stable since the most recent comparison evaluation but are not definitely seen on the study of October 05, 2018. There is hepatic steatosis and liver contours or more lobular. Largest 1 measures water density on image 180 of series 5 and is stable at approximately 1.5 cm. Musculoskeletal: Spinal degenerative changes. No acute or destructive bone process. Review of the MIP images confirms the above findings. IMPRESSION: 1. Areas of ground-glass attenuation at the lung bases, may represent atelectasis or mild pneumonitis. 2. No evidence of pulmonary embolus to the lobar or segmental level. 3. Mild cardiomegaly with signs of pericardial fluid similar to the previous study from September of 2021. 4. Potential hepatic cysts but not seen in February of 2020. Profound hepatic steatosis at that time may not have allowed for detection. Suggest follow-up liver  ultrasound as an initial means of further evaluating these findings as there may be underlying liver disease in this patient and this area is not well assessed. 5. Aortic atherosclerosis. Aortic Atherosclerosis (ICD10-I70.0). Electronically Signed   By: Donzetta Kohut M.D.   On: 09/12/2020 21:15   DG Chest Portable 1 View  Result Date: 09/12/2020 CLINICAL DATA:  Shortness of breath, COVID EXAM: PORTABLE CHEST 1 VIEW COMPARISON:  07/04/2020 FINDINGS: Very low lung volumes. Mild cardiomegaly. Bibasilar atelectasis. No effusions or acute bony abnormality. IMPRESSION: Low lung volumes, bibasilar atelectasis. Electronically Signed   By: Charlett Nose M.D.   On: 09/12/2020 20:05    ____________________________________________   PROCEDURES  Procedure(s) performed (including Critical Care):  .Critical Care Performed by: Concha Se, MD Authorized by: Concha Se, MD   Critical care provider statement:    Critical care time (minutes):  45   Critical care was necessary to treat or prevent imminent or life-threatening deterioration of the following conditions:  Respiratory failure   Critical care was time spent personally by me on the following activities:  Discussions with consultants, evaluation of patient's response to treatment, examination of patient, ordering and performing treatments and interventions, ordering and review of laboratory studies, ordering and review of radiographic studies, pulse oximetry, re-evaluation of patient's condition, obtaining history from patient or surrogate and review of old charts .1-3 Lead EKG Interpretation Performed by: Concha Se, MD Authorized by: Concha Se, MD     Interpretation: normal     ECG rate:  80s   ECG rate assessment: normal     Rhythm: sinus rhythm     Ectopy: none     Conduction: normal       ____________________________________________   INITIAL IMPRESSION / ASSESSMENT AND PLAN / ED COURSE  Brynlee Pennywell was evaluated in  Emergency Department on 09/12/2020 for the symptoms described in the history of present illness. She was evaluated in the context of the global COVID-19 pandemic, which necessitated consideration that the patient might be at risk for infection with the SARS-CoV-2 virus that causes COVID-19. Institutional protocols and algorithms that pertain to the evaluation of patients at risk for COVID-19 are in a state of rapid change based on information released by regulatory bodies including the CDC and federal and state organizations. These policies and algorithms were followed during the patient's care in the ED.     Pt presents with SOB. Suspect could be secondary to COVID. Will obtain testing and get xray to evaluate for PNA, EKG/trop to evaluate for ACS. No H/o Heart Failure. Given continued hypotension and chest x-ray not significantly revealing will get CT as well to evaluate for PE will get labs to evaluate for dehydration and electrolyte abnormalities.  Will continue to closely monitor patient during workup. We will keep patient on the cardiac monitor   Patient placed on 2 L of oxygen given sats in the 80s.  She got 1 L of fluids from EMS.  Patient continues to be hypotensive.  Patient is got another 500 cc head blood pressure is now 85/63.  She still requiring 2 L of oxygen.  Her chest x-ray does not really show significant COVID pneumonia.  Given her low blood pressures and her oxygen levels I think we should proceed with CT PE to make sure no evidence of pulmonary embolism.  Patient given a dose of Solu-Medrol for the COVID  Patient is got a total of 3 L of fluids. 2 with me and 1 with EMS. Patient blood pressures are stabilized. Will discuss with hospital team for admission       ____________________________________________   FINAL CLINICAL IMPRESSION(S) / ED DIAGNOSES   Final diagnoses:  Acute respiratory failure with hypoxia (HCC)  COVID-19  Dehydration      MEDICATIONS GIVEN  DURING THIS VISIT:  Medications  lactated ringers bolus 1,000 mL (0 mLs Intravenous Stopped 09/12/20 2025)  methylPREDNISolone sodium succinate (SOLU-MEDROL) 125 mg/2 mL injection 125 mg (125 mg Intravenous Given 09/12/20 1950)  iohexol (OMNIPAQUE) 350 MG/ML injection 100 mL (100 mLs Intravenous Contrast Given 09/12/20 2040)     ED Discharge Orders    None       Note:  This document was prepared using Dragon voice recognition software and may include unintentional dictation errors.   Concha Se, MD 09/12/20 2226

## 2020-09-12 NOTE — H&P (Signed)
History and Physical    Amanda CampionLeomia [name] RUE:[id number] DOB: [birth year] DOA: 09/12/2020  PCP: Emogene MorganAycock, Ngwe A, MD (Confirm with patient/family/NH records and if not entered, [facility]) Patient coming from: home  I have personally briefly reviewed patient's old medical records in [facility]  Chief Complaint: Weakness, hypotension, SOB  HPI: Amanda [name] is a 55 y.o. female with medical history significant of complex history listed below that includes orthostatic hypotension for which she has been evaluated by endocrine. She was diagnosed with COVID on 12/30 who had progressive symptoms of weakness, myalgias, chest discomfort, cough, fatigue for which she called EMS. ON arrival EMT found SBP in th 50's, O2 sats 80%. She was transported to [facility] for evaluation and treatment.  She reports she was seen by her doctor yesterday and was prescribed antibiotics.  Unclear what they were.  On EMS arrival patient was 70/57. [name] Kitchen.  ED Course: T 97.7  BP 70/34 - after 2 L IVF 121/81. Lab revealed lactic acid 1.5, glucose 229, Cr 1.32 (1.09 07/19/20), BNP 30, procalcitonin <0.10, CBC nl. CXR - lov volume, bibasilar atelectasis. CTA chest negative for PE, ground glass opacities at bases. She was continued  on IVF, given solumedrol. [facility] called to admit for covid pneumonia.  Review of Systems: As per HPI otherwise 10 point review of systems negative.    Past Medical History:  Diagnosis Date  . ADD (attention deficit disorder)   . Anxiety   . Arthritis    knees, shoulder, upper back  . Asthma   . CFS (chronic fatigue syndrome)   . Chewing difficulty   . CHF (congestive heart failure) (HCC)   . Chronic kidney disease   . Constipation   . Depression   . Depression   . Diabetes mellitus without complication (HCC)   . Dyspnea   . Environmental allergies   . Fatty liver   . Fibromyalgia   . GERD (gastroesophageal reflux disease)   . GI bleed 10/02/2018  . Headache     migraines - 5x/mo  . Hypertension   . Hypokalemia 10/14/2017  . Hypothyroidism   . IBS (irritable bowel syndrome)   . Joint pain   . Lower extremity edema   . Major depressive disorder, single episode   . Migraine without aura and without status migrainosus, not intractable   . Migraines   . Motion sickness    ships  . Osteoarthritis   . Other specified disorders of thyroid   . Sleep apnea   . Stroke [facility](HCC)    no residual deficits  . Swallowing difficulty   . Thyroid nodule    bilateral and goiter  . Vitamin D deficiency   . Wears contact lenses   . Wears dentures    partial upper    Past Surgical History:  Procedure Laterality Date  . ABDOMINAL HYSTERECTOMY  2000's  . ABDOMINAL HYSTERECTOMY    . ABDOMINAL SURGERY     laparoscopy x4 with lysis of adhesions  . APPENDECTOMY  1986  . CESAREAN SECTION  1992  . CHOLECYSTECTOMY N/A 04/03/2017   Procedure: LAPAROSCOPIC CHOLECYSTECTOMY;  Surgeon: Henrene DodgePiscoya, Jose, MD;  Location: [facility] ORS;  Service: General;  Laterality: N/A;  . COLONOSCOPY N/A 10/06/2018   Procedure: COLONOSCOPY;  Surgeon: Toney ReilVanga, Rohini Reddy, MD;  [facility];  Service: Gastroenterology;  Laterality: N/A;  . COLONOSCOPY WITH PROPOFOL N/A 03/10/2017   Procedure: COLONOSCOPY WITH PROPOFOL;  Surgeon: Scot JunElliott, Robert T, MD;  Location:  [facility] ENDOSCOPY;  Service: Endoscopy;  Laterality: N/A;  . ECTOPIC PREGNANCY SURGERY  2000's  . ESOPHAGOGASTRODUODENOSCOPY (EGD) WITH PROPOFOL N/A 03/10/2017   Procedure: ESOPHAGOGASTRODUODENOSCOPY (EGD) WITH PROPOFOL;  Surgeon: Scot Jun, MD;  Location[facility]COPY;  Service: Endoscopy;  Laterality: N/A;  . HERNIA REPAIR    . JOINT REPLACEMENT Right 12/16/12   knee- medial - makoplasty  . KNEE ARTHROSCOPY Right 2006   partial medial and lateral meniscectomies  . KNEE ARTHROSCOPY Right 10/06/2015   Procedure: RIGHT KNEE ARTHROSCOPY WITH DEBRIDEMENT;  Surgeon: Erin Sons, MD;  Location: [facility];  Service:  Orthopedics;  Laterality: Right;  Diabetic - insulin and oral meds  . ROTATOR CUFF REPAIR Left 2006  . TUBAL LIGATION  2000's   Soc Hx - married 23 years divorced. Remarried and 2nd husband died after 6 months. She has 5 children. She lives with her daughter.     reports that she has never smoked. She has never used smokeless tobacco. She reports previous alcohol use. She reports that she does not use drugs.  Allergies  Allergen Reactions  . Penicillins Anaphylaxis    Has patient had a PCN reaction causing immediate rash, facial/tongue/throat swelling, SOB or lightheadedness with hypotension: Yes Has patient had a PCN reaction causing severe rash involving mucus membranes or skin necrosis: No Has patient had a PCN reaction that required hospitalization No Has patient had a PCN reaction occurring within the last 10 years: No If all of the above answers are "NO", then may proceed with Cephalosporin use.  Has patient had a PCN reaction causing immediate rash, facial/tongue/throat swelling, SOB or lightheadedness with hypotension: Yes Has patient had a PCN reaction causing severe rash involving mucus membranes or skin necrosis: No Has patient had a PCN reaction that required hospitalization No Has patient had a PCN reaction occurring within the last 10 years: No If all of the above answers are "NO", then may proceed with Cephalosporin use. Other reaction(s): Other (See Comments) Severe/almost died Other reaction(s): ANAPHYLAXIS Has patient had a PCN reaction causing immediate rash, facial/tongue/throat swelling, SOB or lightheadedness with hypotension: Yes Has patient had a PCN reaction causing severe rash involving mucus membranes or skin necrosis: No Has patient had a PCN reaction that required hospitalization No Has patient had a PCN reaction occurring within the last 10 years: No If all of the above answers are "NO", then may proceed with Cephalosporin use. Other reaction(s): Other  (See Comments) Severe/almost died Other reaction(s): ANAPHYLAXIS Has patient had a PCN reaction causing immediate rash, facial/tongue/throat swelling, SOB or lightheadedness with hypotension: Yes Has patient had a PCN reaction causing severe rash involving mucus membranes or skin necrosis: No Has patient had a PCN reaction that required hospitalization No Has patient had a PCN reaction occurring within the last 10 years: No If all of the above answers are "NO", then may proceed with Cephalospo... (TRUNCATED)  . Penicillins Anaphylaxis  . Ace Inhibitors Swelling  . Ace Inhibitors Swelling    Family History  Problem Relation Age of Onset  . Hypertension Mother   . Diabetes Mother   . Heart disease Mother   . Depression Mother   . Obesity Mother   . Hypertension Father   . Diabetes Father   . Allergies Father   . Kidney disease Father   . Cancer Father   . Breast cancer Neg Hx      Prior to Admission medications   Medication Sig Start Date End Date Taking? Authorizing Provider  Albuterol Sulfate (PROAIR RESPICLICK) 108 (90 Base) MCG/ACT AEPB Inhale into the lungs. 11/12/19   [provider]  Ascorbic Acid (VITAMIN C CR) 1000 MG TBCR Take 1 tablet by mouth daily.    [provider]  B-D UF III MINI PEN NEEDLES 31G X 5 MM MISC Inject into the skin daily. 03/18/20   [provider]  BD PEN NEEDLE MICRO U/F 32G X 6 MM MISC Inject into the skin. 06/14/20   [provider]  cariprazine (VRAYLAR) capsule Take 1 capsule by mouth daily. 07/17/20   [provider]  cholecalciferol (VITAMIN D) 25 MCG (1000 UNIT) tablet Take 1,000 Units by mouth daily. 05/01/20   [provider]  dexmethylphenidate (FOCALIN XR) 15 MG 24 hr capsule Take 1 capsule by mouth daily. 07/14/20   [provider]  DULoxetine (CYMBALTA) 30 MG capsule Take 30 mg by mouth daily.  Patient not taking: Reported on 08/15/2020 06/07/20   [provider]   estradiol (ESTRACE) 0.1 MG/GM vaginal cream  10/15/19   [provider]  flunisolide (NASALIDE) 25 MCG/ACT (0.025%) SOLN Place into the nose.    [provider]  furosemide (LASIX) 20 MG tablet Take by mouth. 07/24/20   [provider]  HUMALOG KWIKPEN 100 UNIT/ML KwikPen SMARTSIG:10 Unit(s) SUB-Q Patient not taking: Reported on 08/15/2020 05/19/20   [provider]  hydrOXYzine (ATARAX/VISTARIL) 25 MG tablet Take 25 mg by mouth 2 (two) times daily. Patient not taking: Reported on 08/15/2020 06/06/20   [provider]  INPEN 100-GRAY-LILLY DEVI as directed. 06/13/20   [provider]  Insulin Pen Needle 31G X 6 MM MISC SMARTSIG:1 Needle SUB-Q 4 Times Daily 05/19/20   [provider]  ipratropium-albuterol (DUONEB) 0.5-2.5 (3) MG/3ML SOLN Inhale into the lungs.    [provider]  JARDIANCE 25 MG TABS tablet Take 25 mg by mouth daily. 03/18/20   [provider]  LATUDA 40 MG TABS tablet Take 40 mg by mouth at bedtime. Patient not taking: Reported on 08/15/2020 04/12/20   [provider]  lidocaine (LIDODERM) 5 % lidocaine 5 % topical patch  PLEASE SEE ATTACHED FOR DETAILED DIRECTIONS 08/16/19   [provider]  LINZESS 290 MCG CAPS capsule Take 290 mcg by mouth daily. 04/12/20   [provider]  liraglutide (VICTOZA) 18 MG/3ML SOPN Inject 0.5 mLs (3 mg total) into the skin daily. 01/11/20   Whitmire, Thermon Leyland, FNP  LORazepam (ATIVAN) 0.5 MG tablet Take 0.5 mg by mouth 2 (two) times daily as needed. 02/29/20   [provider]  losartan (COZAAR) 50 MG tablet Hold until followup with outpatient doctor because your blood pressure was low on presentation to the hospital. 07/04/20   Darlin Priestly, MD  mirtazapine (REMERON SOL-TAB) 30 MG disintegrating tablet Take 30 mg by mouth at bedtime as needed (Nap).  03/18/18   [provider]  montelukast (SINGULAIR) 10 MG tablet Take 10 mg by mouth  daily. 04/12/20   [provider]  NOVOLOG FLEXPEN 100 UNIT/ML FlexPen Inject 10 Units into the skin daily. 05/22/20   [provider]  NOVOLOG PENFILL cartridge Inject into the skin. 06/14/20   [provider]  omeprazole (PRILOSEC) 40 MG capsule Take 40 mg by mouth daily. 04/12/20   [provider]  potassium chloride SA (KLOR-CON) 20 MEQ tablet Take 20 mEq by mouth daily. 04/12/20   [provider]  pravastatin (PRAVACHOL) 40 MG tablet Take 40 mg by mouth daily. 04/12/20  [provider]  pregabalin (LYRICA) 150 MG capsule Take 150 mg by mouth 3 (three) times daily. 04/12/20   [provider]  PROAIR RESPICLICK 108 (90 Base) MCG/ACT AEPB Inhale 2 puffs into the lungs every 4 (four) hours as needed. 11/12/19   [provider]  propranolol (INNOPRAN XL) 80 MG 24 hr capsule Take by mouth. Patient not taking: Reported on 08/15/2020 07/24/20   [provider]  propranolol ER (INDERAL LA) 80 MG 24 hr capsule Take 80 mg by mouth daily. Patient not taking: Reported on 08/15/2020 04/12/20   [provider]  tiZANidine (ZANAFLEX) 4 MG tablet Take 4 mg by mouth every 8 (eight) hours as needed for muscle spasms.     [provider]  topiramate (TOPAMAX) 100 MG tablet Take 100 mg by mouth 2 (two) times daily. 04/12/20   [provider]  TRESIBA FLEXTOUCH 200 UNIT/ML FlexTouch Pen 72 units per day seems very high for you.  Please follow up with your outpatient provider for further adjustment. 07/04/20   Darlin Priestly, MD  TRUEPLUS PEN NEEDLES 31G X 6 MM MISC  05/22/20   [provider]  zolpidem (AMBIEN) 10 MG tablet  06/23/20   [provider]    Physical Exam: Vitals:   09/12/20 2020 09/12/20 2100 09/12/20 2130 09/12/20 2200  BP: (!) 76/56 112/79 119/78 121/85  Pulse: 77 85 72 77  Resp: 14 14 19 14   Temp:      TempSrc:      SpO2: 100% 100% 100% 100%     Vitals:   09/12/20 2020  09/12/20 2100 09/12/20 2130 09/12/20 2200  BP: (!) 76/56 112/79 119/78 121/85  Pulse: 77 85 72 77  Resp: 14 14 19 14   Temp:      TempSrc:      SpO2: 100% 100% 100% 100%   General: overweight woman in no distress Eyes: PERRL, lids and conjunctivae normal ENMT: Mucous membranes are moist.  Neck: normal, supple, no masses, no thyromegaly Respiratory: clear to auscultation bilaterally, no wheezing, bibasilar crackles. Normal respiratory effort. No accessory muscle use.  Cardiovascular: Regular rate and rhythm, no murmurs / rubs / gallops. No extremity edema. 2+ pedal pulses. No carotid bruits.  Abdomen: obese, no tenderness, no masses palpated. No hepatosplenomegaly. Bowel sounds positive.  Musculoskeletal: no clubbing / cyanosis. No joint deformity upper and lower extremities. Good ROM, no contractures. Normal muscle tone.  Skin: no rashes, lesions, ulcers. No induration Neurologic: CN 2-12 grossly intact. Sensation intact. Strength 5/5 in all 4.  Psychiatric: Normal judgment and insight. Alert and oriented x 3. Normal mood.     Labs on Admission: I have personally reviewed following labs and imaging studies  CBC: Recent Labs  Lab 09/12/20 1923  WBC 6.5  NEUTROABS 3.7  HGB 12.3  HCT 38.1  MCV 89.9  PLT 318   Basic Metabolic Panel: Recent Labs  Lab 09/12/20 1923  NA 135  K 4.3  CL 98  CO2 27  GLUCOSE 229*  BUN 16  CREATININE 1.32*  CALCIUM 9.0   GFR: CrCl cannot be calculated (Unknown ideal weight.). Liver Function Tests: Recent Labs  Lab 09/12/20 1923  AST 32  ALT 39  ALKPHOS 110  BILITOT 0.6  PROT 6.9  ALBUMIN 3.7   No results for input(s): LIPASE, AMYLASE in the last 168 hours. No results for input(s): AMMONIA in the last 168 hours. Coagulation Profile: No results for input(s): INR, PROTIME in the last 168 hours. Cardiac Enzymes:  No results for input(s): CKTOTAL, CKMB, CKMBINDEX, TROPONINI in the last 168 hours. BNP (last 3 results) No results for  input(s): PROBNP in the last 8760 hours. HbA1C: No results for input(s): HGBA1C in the last 72 hours. CBG: No results for input(s): GLUCAP in the last 168 hours. Lipid Profile: No results for input(s): CHOL, HDL, LDLCALC, TRIG, CHOLHDL, LDLDIRECT in the last 72 hours. Thyroid Function Tests: No results for input(s): TSH, T4TOTAL, FREET4, T3FREE, THYROIDAB in the last 72 hours. Anemia Panel: No results for input(s): VITAMINB12, FOLATE, FERRITIN, TIBC, IRON, RETICCTPCT in the last 72 hours. Urine analysis:    Component Value Date/Time   COLORURINE YELLOW 11/09/2019 0126   APPEARANCEUR CLEAR 11/09/2019 0126   APPEARANCEUR Cloudy 11/12/2011 1721   LABSPEC 1.010 11/09/2019 0126   LABSPEC 1.010 11/12/2011 1721   PHURINE 6.0 11/09/2019 0126   GLUCOSEU 500 (A) 11/09/2019 0126   GLUCOSEU Negative 11/12/2011 1721   HGBUR NEGATIVE 11/09/2019 0126   BILIRUBINUR NEGATIVE 11/09/2019 0126   BILIRUBINUR Negative 11/12/2011 1721   KETONESUR NEGATIVE 11/09/2019 0126   PROTEINUR NEGATIVE 11/09/2019 0126   NITRITE NEGATIVE 11/09/2019 0126   LEUKOCYTESUR NEGATIVE 11/09/2019 0126   LEUKOCYTESUR 1+ 11/12/2011 1721    Radiological Exams on Admission: CT Angio Chest PE W and/or Wo Contrast  Result Date: 09/12/2020 CLINICAL DATA:  PE suspected, lethargy weakness and hypotension, history of COVID infection diagnosed on December 30th. EXAM: CT ANGIOGRAPHY CHEST WITH CONTRAST TECHNIQUE: Multidetector CT imaging of the chest was performed using the standard protocol during bolus administration of intravenous contrast. Multiplanar CT image reconstructions and MIPs were obtained to evaluate the vascular anatomy. CONTRAST:  100mL OMNIPAQUE IOHEXOL 350 MG/ML SOLN COMPARISON:  September of 2021 FINDINGS: Cardiovascular: Normal caliber thoracic aorta. Heart size mildly enlarged with signs of pericardial fluid similar to the previous study from September of 2021. Central pulmonary vasculature of normal caliber. Lung  base assessment is limited by respiratory motion the subsegmental level. No central, lobar or segmental level embolus. Mediastinum/Nodes: Patulous esophagus. No adenopathy in the thoracic inlet. No mediastinal lymphadenopathy no thoracic inlet adenopathy no hilar adenopathy. Lungs/Pleura: Areas of ground-glass attenuation at the lung bases. Linear atelectatic changes. No dense consolidation. No sign of pleural effusion. Upper Abdomen: Incidental imaging of upper abdominal contents shows low-attenuation lesions in the liver that are stable since the most recent comparison evaluation but are not definitely seen on the study of October 05, 2018. There is hepatic steatosis and liver contours or more lobular. Largest 1 measures water density on image 180 of series 5 and is stable at approximately 1.5 cm. Musculoskeletal: Spinal degenerative changes. No acute or destructive bone process. Review of the MIP images confirms the above findings. IMPRESSION: 1. Areas of ground-glass attenuation at the lung bases, may represent atelectasis or mild pneumonitis. 2. No evidence of pulmonary embolus to the lobar or segmental level. 3. Mild cardiomegaly with signs of pericardial fluid similar to the previous study from September of 2021. 4. Potential hepatic cysts but not seen in February of 2020. Profound hepatic steatosis at that time may not have allowed for detection. Suggest follow-up liver ultrasound as an initial means of further evaluating these findings as there may be underlying liver disease in this patient and this area is not well assessed. 5. Aortic atherosclerosis. Aortic Atherosclerosis (ICD10-I70.0). Electronically Signed   By: Donzetta KohutGeoffrey  Wile M.D.   On: 09/12/2020 21:15   DG Chest Portable 1 View  Result Date: 09/12/2020 CLINICAL DATA:  Shortness of breath, COVID EXAM:  PORTABLE CHEST 1 VIEW COMPARISON:  07/04/2020 FINDINGS: Very low lung volumes. Mild cardiomegaly. Bibasilar atelectasis. No effusions or acute  bony abnormality. IMPRESSION: Low lung volumes, bibasilar atelectasis. Electronically Signed   By: Charlett Nose M.D.   On: 09/12/2020 20:05    EKG: Independently reviewed. NSR, no acute changes  Assessment/Plan Active Problems:   Pneumonia due to COVID-19 virus   Diabetes mellitus, type 2 (HCC)   Class 2 severe obesity with serious comorbidity and body mass index (BMI) of 36.0 to 36.9 in adult [facility])   Chronic congestive heart failure (HCC)   COPD (chronic obstructive pulmonary disease) (HCC)   Esophageal reflux   Hypertension   Hyperlipidemia  (please populate well all problems here in Problem List. (For example, if patient is on BP meds at home and you resume or decide to hold them, it is a problem that needs to be her. Same for CAD, COPD, HLD and so on)   1. covid pneumonia - patient with h/o hypotension but now with Covid positive test 08/31/20, persistent and worsening hypoxemia, hypotension, mild renal insufficiency, myalgia, ground glass opacities at lung bases. She is beyond the window for antiviral agents. Plan  Med surg admit  Covid protocols  High dose steroids  Oxygen to keep sats >90%  2. Orthostatic hypotension - a chronic problem related to recurrent dehydration and medications. She has been evalauted by Dr. Rachel Moulds [facility]-endocrinology 08/22/20 for possible adrenal insufficiency which is thought unlikely. Hospitalizaions 10/09/19, 2/16,21, 10/24/19, 3/8,21, 12/06/19, with additional hospital visits since.  She was hypotensive today and syncopal. She did respond to 3 L total of IVF. Plan Continue IVF at maintenance rate.  3. COPD - continue home meds  4. HTN - will hold home meds until hypotension is resolved and then resume  5. GERD - continue PPI  6. HLD - continue home meds  7. DM - continue home meds plus ss due to steroids   In summary: patient with complex h/o including chronic orthostatic hypotension and near syncope who tested positive for covid, had  progressive/persistent symptoms leading to episode on the day of admission of hypotension, near syncope, and hypoxemia. She is admitted for oxygen support, steroids, hydration.  DVT prophylaxis: lovenox  Code Status: full code Family Communication: Patients daughter was on phone until it died. Patient will relay the information she missed. (Specify name, relationship. Do not write "discussed with patient". Specify tel # if discussed over the phone) Disposition Plan: home when stable  Consults called: none (with names) Admission status: inpatient    Illene Regulus MD [facility] Pager [phone]  If 7PM-7AM, please contact night-coverage [email or web address] Password [facility]  09/12/2020, 11:45 PM

## 2020-09-12 NOTE — ED Notes (Signed)
Resumed care from robin rn.  Pt alert, watching tv. nsr on monitor.  States feeling better   Iv in place.

## 2020-09-12 NOTE — ED Notes (Addendum)
Pt 99% on 4L

## 2020-09-13 ENCOUNTER — Other Ambulatory Visit: Payer: Self-pay

## 2020-09-13 DIAGNOSIS — J449 Chronic obstructive pulmonary disease, unspecified: Secondary | ICD-10-CM

## 2020-09-13 DIAGNOSIS — U071 COVID-19: Principal | ICD-10-CM

## 2020-09-13 DIAGNOSIS — J9601 Acute respiratory failure with hypoxia: Secondary | ICD-10-CM

## 2020-09-13 DIAGNOSIS — N179 Acute kidney failure, unspecified: Secondary | ICD-10-CM

## 2020-09-13 DIAGNOSIS — Z6836 Body mass index (BMI) 36.0-36.9, adult: Secondary | ICD-10-CM

## 2020-09-13 LAB — COMPREHENSIVE METABOLIC PANEL
ALT: 54 U/L — ABNORMAL HIGH (ref 0–44)
AST: 40 U/L (ref 15–41)
Albumin: 4 g/dL (ref 3.5–5.0)
Alkaline Phosphatase: 125 U/L (ref 38–126)
Anion gap: 11 (ref 5–15)
BUN: 13 mg/dL (ref 6–20)
CO2: 25 mmol/L (ref 22–32)
Calcium: 9.5 mg/dL (ref 8.9–10.3)
Chloride: 102 mmol/L (ref 98–111)
Creatinine, Ser: 0.79 mg/dL (ref 0.44–1.00)
GFR, Estimated: >60 mL/min (ref >60)
Glucose, Bld: 188 mg/dL — ABNORMAL HIGH (ref 70–99)
Potassium: 4.3 mmol/L (ref 3.5–5.1)
Sodium: 138 mmol/L (ref 135–145)
Total Bilirubin: 0.8 mg/dL (ref 0.3–1.2)
Total Protein: 7.8 g/dL (ref 6.5–8.1)

## 2020-09-13 LAB — BRAIN NATRIURETIC PEPTIDE: B Natriuretic Peptide: 64 pg/mL (ref 0.0–100.0)

## 2020-09-13 LAB — HEMOGLOBIN A1C
Hgb A1c MFr Bld: 7.9 % — ABNORMAL HIGH (ref 4.8–5.6)
Mean Plasma Glucose: 180.03 mg/dL

## 2020-09-13 LAB — GLUCOSE, CAPILLARY
Glucose-Capillary: 156 mg/dL — ABNORMAL HIGH (ref 70–99)
Glucose-Capillary: 185 mg/dL — ABNORMAL HIGH (ref 70–99)
Glucose-Capillary: 340 mg/dL — ABNORMAL HIGH (ref 70–99)
Glucose-Capillary: 227 mg/dL — ABNORMAL HIGH (ref 70–99)

## 2020-09-13 LAB — FERRITIN: Ferritin: 33 ng/mL (ref 11–307)

## 2020-09-13 LAB — PROCALCITONIN: Procalcitonin: <0.1 ng/mL

## 2020-09-13 LAB — C-REACTIVE PROTEIN
CRP: 1.1 mg/dL — ABNORMAL HIGH (ref <1.0)
CRP: 2.5 mg/dL — ABNORMAL HIGH (ref <1.0)

## 2020-09-13 LAB — FIBRINOGEN: Fibrinogen: 530 mg/dL — ABNORMAL HIGH (ref 210–475)

## 2020-09-13 MED ORDER — PREDNISONE 50 MG PO TABS
50.0000 mg | ORAL_TABLET | Freq: Every day | ORAL | Status: DC
  Administered 2020-09-14: 50 mg via ORAL
  Filled 2020-09-13: qty 1

## 2020-09-13 MED ORDER — ENOXAPARIN SODIUM 60 MG/0.6ML ~~LOC~~ SOLN: 0.5000 mg/kg | SUBCUTANEOUS | Status: DC

## 2020-09-13 MED ORDER — ALBUTEROL SULFATE HFA 108 (90 BASE) MCG/ACT IN AERS
2.0000 | INHALATION_SPRAY | RESPIRATORY_TRACT | Status: DC | PRN
  Administered 2020-09-13: 2 via RESPIRATORY_TRACT
  Filled 2020-09-13: qty 6.7

## 2020-09-13 MED ORDER — ZOLPIDEM TARTRATE 5 MG PO TABS: 5.0000 mg | ORAL_TABLET | Freq: Every evening | ORAL | Status: DC | PRN

## 2020-09-13 NOTE — ED Notes (Signed)
meds given  Pt on cell phone

## 2020-09-13 NOTE — Evaluation (Signed)
Physical Therapy Evaluation Patient Details Name: Amanda [name] MRN: [id number] DOB: 01/22/1966 Today's Date: 09/13/2020   History of Present Illness  Amanda [name] is a 54yoF who comes to [facility] on 1/11 c increased lethargy. Pt tested (+) for COVID on 12/30. Pt hypotensive upon arrival. Pt reports intermittent difficulty with syncope at home often triggered by state of dehydration. Pt checks her BP adn BG at home regularly, reports BG in 100-200 range often, most recent A1C in 9s (per pt). Pt reports >25year history DM c PND in 4 limbs, no full anesthesia, no motor less. Per pt, she has had COVID infection 3x now.  Clinical Impression  Pt admitted with above diagnosis. Pt currently with functional limitations due to the deficits listed below (see "PT Problem List"). Upon entry, pt in recliner, awake and agreeable to participate. The pt is alert, Pt performs all basic mobility at supervision level, reportedly moderately impaired strength and activity tolerance compared to baseline. All mobility on room air, no desaturation. Orthostatic vitals demonstrate mild dysfunction with correction. No vitals to correlate pat's feeling of wooziness with activity, but author encouraged patient to limit her exertion based on symptoms given her history of syncope. Pt pleasant, interactive, and able to provide info regarding prior level of function, both in tolerance and independence. Patient's performance this date reveals decreased ability, independence, and tolerance in performing all basic mobility required for performance of activities of daily living. Pt requires additional DME, close physical assistance, and cues for safe participate in mobility. Pt will benefit from skilled PT intervention to increase independence and safety with basic mobility in preparation for discharge to the venue listed below.       Follow Up Recommendations Home health PT    Equipment Recommendations  Rolling walker with 5" wheels     Recommendations for Other Services       Precautions / Restrictions Precautions Precautions: Fall Precaution Comments: orthostatic hypotension issues at baseline, Hx of syncope Restrictions Weight Bearing Restrictions: No      Mobility  Bed Mobility               General bed mobility comments: in recliner at entry    Transfers Overall transfer level: Needs assistance Equipment used: None Transfers: Sit to/from Stand Sit to Stand: Supervision            Ambulation/Gait   Gait Distance (Feet): 120 Feet (has increased wooziness) Assistive device: None       General Gait Details: slow, attending to imbalance issues. No SOB, no desaturation.  Stairs            Wheelchair Mobility    Modified Rankin (Stroke Patients Only)       Balance Overall balance assessment: Modified Independent;History of Falls                                           Pertinent Vitals/Pain      Home Living Family/patient expects to be discharged to:: Private residence Living Arrangements: Children (adult DTR, home 24/7) Available Help at Discharge: Family Type of Home: House Home Access: Level entry     Home Layout: One level Home Equipment: None      Prior Function Level of Independence: Needs assistance         Comments: Rt partial knee replacement, sometimes gives way. Chronic issue.     Hand Dominance  Extremity/Trunk Assessment   Upper Extremity Assessment Upper Extremity Assessment: Generalized weakness;Overall [facility] for tasks assessed    Lower Extremity Assessment Lower Extremity Assessment: Generalized weakness;Overall [facility] for tasks assessed    Cervical / Trunk Assessment Cervical / Trunk Assessment: Normal  Communication      Cognition Arousal/Alertness: Awake/alert Behavior During Therapy: WFL for tasks assessed/performed Overall Cognitive Status: Within Functional Limits for tasks assessed                                         General Comments      Exercises     Assessment/Plan    PT Assessment Patient needs continued PT services  PT Problem List Decreased strength;Decreased activity tolerance;Decreased balance;Decreased mobility;Decreased coordination;Decreased cognition;Decreased knowledge of use of DME;Decreased safety awareness;Decreased knowledge of precautions       PT Treatment Interventions DME instruction;Balance training;Gait training;Stair training;Functional mobility training;Therapeutic activities;Therapeutic exercise;Patient/family education    PT Goals (Current goals can be found in the Care Plan section)  Acute Rehab PT Goals Patient Stated Goal: improve indendence with ADL, stop having syncope at home. PT Goal Formulation: With patient Time For Goal Achievement: 09/27/20 Potential to Achieve Goals: Fair    Frequency Min 2X/week   Barriers to discharge        Co-evaluation               AM-PAC PT "6 Clicks" Mobility  Outcome Measure Help needed turning from your back to your side while in a flat bed without using bedrails?: None Help needed moving from lying on your back to sitting on the side of a flat bed without using bedrails?: None Help needed moving to and from a bed to a chair (including a wheelchair)?: None Help needed standing up from a chair using your arms (e.g., wheelchair or bedside chair)?: None Help needed to walk in hospital room?: A Little Help needed climbing 3-5 steps with a railing? : A Little 6 Click Score: 22    End of Session   Activity Tolerance: Patient tolerated treatment well;No increased pain;Patient limited by fatigue Patient left: in chair;with call bell/phone within reach   PT Visit Diagnosis: Unsteadiness on feet (R26.81);Difficulty in walking, not elsewhere classified (R26.2);Other abnormalities of gait and mobility (R26.89);Repeated falls (R29.6);Muscle weakness (generalized) (M62.81);History of  falling (Z91.81)    Time: 1327-1400 PT Time Calculation (min) (ACUTE ONLY): 33 min   Charges:   PT Evaluation $PT Eval Moderate Complexity: 1 Mod PT Treatments $Self Care/Home Management: 8-22        5:22 PM, 09/13/20 Rosamaria Lints, PT, DPT Physical Therapist - [facility]  [phone] (ASCOM)  s  Rosamaria Lints 09/13/2020, 5:20 PM

## 2020-09-13 NOTE — Progress Notes (Signed)
PHARMACIST - PHYSICIAN COMMUNICATION  CONCERNING:  Enoxaparin (Lovenox) for DVT Prophylaxis    RECOMMENDATION: Patient was prescribed enoxaprin 40mg  q24 hours for VTE prophylaxis.   Filed Weights   09/13/20 0000 09/13/20 0022  Weight: 104.3 kg (230 lb) 104.3 kg (230 lb)    Body mass index is 38.27 kg/m.  Estimated Creatinine Clearance: 57.7 mL/min (A) (by C-G formula based on SCr of 1.32 mg/dL (H)).   Based on [facility] policy patient is candidate for enoxaparin 0.5mg /kg TBW SQ every 24 hours based on BMI being >30.   DESCRIPTION: Pharmacy has adjusted enoxaparin dose per [facility] policy.  Patient is now receiving enoxaparin 0.5 mg/kg every 24 hours   [facility], PharmD, [facility] 09/13/2020 12:29 AM

## 2020-09-13 NOTE — Plan of Care (Signed)

## 2020-09-13 NOTE — ED Notes (Signed)
Pt watching tv

## 2020-09-13 NOTE — Progress Notes (Signed)
[facility]  - [place] [facility]   PATIENT NAME: Amanda [name]    MR#:  [id number]  DATE OF BIRTH:  01/18/1966  SUBJECTIVE:  men with feeling weak and had a pre-syncopal episode. Currently feeling better. Patient denies any shortness of breath or chest pain. Tolerated breakfast. No nausea vomiting. Oxygen sets more than 92% on room air.  Patient was able to get out to the chair with me and RN in the room by herself. No history of falls at home.  Patient recently diagnosed with COVID. Not feeling the best poor PO intake  REVIEW OF SYSTEMS:   Review of Systems  Constitutional: Negative for chills, fever and weight loss.  HENT: Negative for ear discharge, ear pain and nosebleeds.   Eyes: Negative for blurred vision, pain and discharge.  Respiratory: Negative for sputum production, shortness of breath, wheezing and stridor.   Cardiovascular: Negative for chest pain, palpitations, orthopnea and PND.  Gastrointestinal: Negative for abdominal pain, diarrhea, nausea and vomiting.  Genitourinary: Negative for frequency and urgency.  Musculoskeletal: Negative for back pain and joint pain.  Neurological: Positive for weakness. Negative for sensory change, speech change and focal weakness.  Psychiatric/Behavioral: Negative for depression and hallucinations. The patient is not nervous/anxious.    Tolerating Diet:yes Tolerating PT: ambulatory  DRUG ALLERGIES:   Allergies  Allergen Reactions  . Penicillins Anaphylaxis    Has patient had a PCN reaction causing immediate rash, facial/tongue/throat swelling, SOB or lightheadedness with hypotension: Yes Has patient had a PCN reaction causing severe rash involving mucus membranes or skin necrosis: No Has patient had a PCN reaction that required hospitalization No Has patient had a PCN reaction occurring within the last 10 years: No If all of the above answers are "NO", then may proceed with Cephalosporin use.  Has patient  had a PCN reaction causing immediate rash, facial/tongue/throat swelling, SOB or lightheadedness with hypotension: Yes Has patient had a PCN reaction causing severe rash involving mucus membranes or skin necrosis: No Has patient had a PCN reaction that required hospitalization No Has patient had a PCN reaction occurring within the last 10 years: No If all of the above answers are "NO", then may proceed with Cephalosporin use. Other reaction(s): Other (See Comments) Severe/almost died Other reaction(s): ANAPHYLAXIS Has patient had a PCN reaction causing immediate rash, facial/tongue/throat swelling, SOB or lightheadedness with hypotension: Yes Has patient had a PCN reaction causing severe rash involving mucus membranes or skin necrosis: No Has patient had a PCN reaction that required hospitalization No Has patient had a PCN reaction occurring within the last 10 years: No If all of the above answers are "NO", then may proceed with Cephalosporin use. Other reaction(s): Other (See Comments) Severe/almost died Other reaction(s): ANAPHYLAXIS Has patient had a PCN reaction causing immediate rash, facial/tongue/throat swelling, SOB or lightheadedness with hypotension: Yes Has patient had a PCN reaction causing severe rash involving mucus membranes or skin necrosis: No Has patient had a PCN reaction that required hospitalization No Has patient had a PCN reaction occurring within the last 10 years: No If all of the above answers are "NO", then may proceed with Cephalospo... (TRUNCATED)  . Penicillins Anaphylaxis  . Ace Inhibitors Swelling  . Ace Inhibitors Swelling    VITALS:  Blood pressure (!) 156/98, pulse (!) 101, temperature 98.2 F (36.8 C), resp. rate 18, height 5\' 5"  (1.651 m), weight 104.3 kg, SpO2 99 %.  PHYSICAL EXAMINATION:   Physical Exam  GENERAL:  55 y.o.-year-old patient lying  in the bed with no acute distress. obese HEENT: Head atraumatic, normocephalic. Oropharynx and  nasopharynx clear.  LUNGS: Normal breath sounds bilaterally, no wheezing, rales, rhonchi. No use of accessory muscles of respiration.  CARDIOVASCULAR: S1, S2 normal. No murmurs, rubs, or gallops.  ABDOMEN: Soft, nontender, nondistended. Bowel sounds present. No organomegaly or mass.  EXTREMITIES: No cyanosis, clubbing or edema b/l.    NEUROLOGIC: Cranial nerves II through XII are intact. No focal Motor or sensory deficits b/l.   PSYCHIATRIC:  patient is alert and oriented x 3.  SKIN: No obvious rash, lesion, or ulcer.   LABORATORY PANEL:  CBC Recent Labs  Lab 09/12/20 1923  WBC 6.5  HGB 12.3  HCT 38.1  PLT 318    Chemistries  Recent Labs  Lab 09/13/20 0504  NA 138  K 4.3  CL 102  CO2 25  GLUCOSE 188*  BUN 13  CREATININE 0.79  CALCIUM 9.5  AST 40  ALT 54*  ALKPHOS 125  BILITOT 0.8   Cardiac Enzymes No results for input(s): TROPONINI in the last 168 hours. RADIOLOGY:  CT Angio Chest PE W and/or Wo Contrast  Result Date: 09/12/2020 CLINICAL DATA:  PE suspected, lethargy weakness and hypotension, history of COVID infection diagnosed on December 30th. EXAM: CT ANGIOGRAPHY CHEST WITH CONTRAST TECHNIQUE: Multidetector CT imaging of the chest was performed using the standard protocol during bolus administration of intravenous contrast. Multiplanar CT image reconstructions and MIPs were obtained to evaluate the vascular anatomy. CONTRAST:  OMNIPAQUE IOHEXOL 350 MG/ML SOLN COMPARISON:  September of 2021 FINDINGS: Cardiovascular: Normal caliber thoracic aorta. Heart size mildly enlarged with signs of pericardial fluid similar to the previous study from September of 2021. Central pulmonary vasculature of normal caliber. Lung base assessment is limited by respiratory motion the subsegmental level. No central, lobar or segmental level embolus. Mediastinum/Nodes: Patulous esophagus. No adenopathy in the thoracic inlet. No mediastinal lymphadenopathy no thoracic inlet adenopathy no  hilar adenopathy. Lungs/Pleura: Areas of ground-glass attenuation at the lung bases. Linear atelectatic changes. No dense consolidation. No sign of pleural effusion. Upper Abdomen: Incidental imaging of upper abdominal contents shows low-attenuation lesions in the liver that are stable since the most recent comparison evaluation but are not definitely seen on the study of October 05, 2018. There is hepatic steatosis and liver contours or more lobular. Largest 1 measures water density on image 180 of series 5 and is stable at approximately 1.5 cm. Musculoskeletal: Spinal degenerative changes. No acute or destructive bone process. Review of the MIP images confirms the above findings. IMPRESSION: 1. Areas of ground-glass attenuation at the lung bases, may represent atelectasis or mild pneumonitis. 2. No evidence of pulmonary embolus to the lobar or segmental level. 3. Mild cardiomegaly with signs of pericardial fluid similar to the previous study from September of 2021. 4. Potential hepatic cysts but not seen in February of 2020. Profound hepatic steatosis at that time may not have allowed for detection. Suggest follow-up liver ultrasound as an initial means of further evaluating these findings as there may be underlying liver disease in this patient and this area is not well assessed. 5. Aortic atherosclerosis. Aortic Atherosclerosis (ICD10-I70.0). Electronically Signed   By: Donzetta Kohut M.D.   On: 09/12/2020 21:15   DG Chest Portable 1 View  Result Date: 09/12/2020 CLINICAL DATA:  Shortness of breath, COVID EXAM: PORTABLE CHEST 1 VIEW COMPARISON:  07/04/2020 FINDINGS: Very low lung volumes. Mild cardiomegaly. Bibasilar atelectasis. No effusions or acute bony abnormality. IMPRESSION: Low lung  volumes, bibasilar atelectasis. Electronically Signed   By: Charlett Nose M.D.   On: 09/12/2020 20:05   ASSESSMENT AND PLAN:  Amanda [name] is a 55 y.o. female with medical history significant of complex history listed  below that includes orthostatic hypotension for which she has been evaluated by endocrine. She was diagnosed with COVID on 12/30 who had progressive symptoms of weakness, myalgias, chest discomfort, cough, fatigue for which she called EMS. ON arrival EMT found SBP in th 50's, O2 sats 80%  1. Recent covid pneumonia - patient with h/o hypotension but now with Covid positive test 08/31/20, persistent and worsening hypoxemia, hypotension, mild renal insufficiency, myalgia, ground glass opacities at lung bases. She is beyond the window for antiviral agents. -Covid protocols -- patients has are more than 92% on room air. No respiratory distress noted today. Will switch to oral steroid. --Oxygen to keep sats >90% -- inflammatory marker stable.  2. Orthostatic hypotension/acute renal failure/poor PO intake  - a chronic problem related to recurrent dehydration and medications. She has been evalauted by Dr. Rachel Moulds [facility]-endocrinology 08/22/20 for possible adrenal insufficiency which is thought unlikely. Hospitalizaions 10/09/19, 2/16,21, 10/24/19, 3/8,21, 12/06/19, with additional hospital visits since.  She was hypotensive today and syncopal. She did respond to 3 L total of IVF. -- Patient adequately received IV fluids. She is able to tolerate orals. Her creatinine is back to baseline  3. COPD - continue home meds  4. HTN - will resume at discharge.  5. GERD - continue PPI  6. HLD - continue home meds  7. DM - continue home meds plus ss due to steroids    DVT prophylaxis: lovenox  Code Status: full code Family Communication son Mliss Fritz on the phone Remains inpatient appropriate because:Hemodynamically unstable   Dispo: The patient is from: Home              Anticipated d/c is to: Home              Anticipated d/c date is: 1 day              Patient currently is not medically stable to d/c.  Continue to monitor one more day for blood pressure. Ambulated in the room. Mobility  therapist to work with patient.      TOTAL TIME TAKING CARE OF THIS PATIENT: *25* minutes.  >50% time spent on counselling and coordination of care  Note: This dictation was prepared with Dragon dictation along with smaller phrase technology. Any transcriptional errors that result from this process are unintentional.  Enedina Finner M.D    [facility]   CC: Primary care physician; Emogene Morgan, MDPatient ID: Amanda [name], female   DOB: May 28, 1966, 55 y.o.   MRN: [id number]

## 2020-09-13 NOTE — ED Notes (Signed)
This RN spoke with Tam. Tam RN states no further questions at this time.

## 2020-09-14 ENCOUNTER — Other Ambulatory Visit: Payer: Self-pay | Admitting: *Deleted

## 2020-09-14 DIAGNOSIS — E860 Dehydration: Secondary | ICD-10-CM | POA: Diagnosis not present

## 2020-09-14 DIAGNOSIS — U071 COVID-19: Secondary | ICD-10-CM | POA: Diagnosis not present

## 2020-09-14 DIAGNOSIS — I9589 Other hypotension: Secondary | ICD-10-CM

## 2020-09-14 DIAGNOSIS — N179 Acute kidney failure, unspecified: Secondary | ICD-10-CM | POA: Diagnosis not present

## 2020-09-14 LAB — GLUCOSE, CAPILLARY
Glucose-Capillary: 151 mg/dL — ABNORMAL HIGH (ref 70–99)
Glucose-Capillary: 201 mg/dL — ABNORMAL HIGH (ref 70–99)

## 2020-09-14 MED ORDER — PREDNISONE 10 MG PO TABS: ORAL_TABLET | ORAL | 0 refills | Status: DC

## 2020-09-14 NOTE — Discharge Instructions (Signed)
Keep log of BP at home °

## 2020-09-14 NOTE — Patient Outreach (Signed)
[facility]) Care Management  09/14/2020  Amanda [name] [birth year] [id number]   THNoutreach to MD referred patient Amanda [name] was referred to [facility] on 07/04/20 by MD Referral Reason:Per notes on 07/04/20 patient reported she was unable to afford her medications most of the time Expensive medications identified are victoza, Jardinace, Breo,Latuda and Linzess Recurrent syncopal events related to medication 07/10/20 [facility] CMA confirmed on APL  Insurance:[employer] Medicare and Medicaid [place] access Last admission/ED 09/12/20-09/14/20 acute respiratory failure with hypoxia, pneumonia due to covid 19 virus, dehydration  07/19/20 ED for syncope, loss of consciousness, hypotension 07/03/20- 07/04/20 for recurrent syncope, depression, HTN, CHF   Patient is able to verify HIPAA [facility] Portability and Accountability Act) identifiers Reviewed and addressed the purpose of the follow up call with the patient  Consent: [facility]) RN CM reviewed [facility] services with patient. Patient gave verbal consent for services   09/14/20 Pt confirms she had just been discharged around time of outreach, had not made it home [facility] RN CM and Pt agreed to reschedule outreach  She reports feeling fair    Plans [facility] RN CM will outreach to patient within the next 3-7 business days   Daemien Fronczak L. Noelle Penner, RN, BSN, CCM [facility] Telephonic Care Management Care Coordinator Office number [phone] [facility] number [phone] Fax number [phone]

## 2020-09-14 NOTE — Discharge Summary (Signed)
[facility] [place] [facility]   PATIENT NAME: Amanda [name]    MR#:  [id number]  DATE OF BIRTH:  [birth year]  DATE OF ADMISSION:  09/12/2020 ADMITTING PHYSICIAN: Amanda Navy, MD  DATE OF DISCHARGE: 09/14/2020  PRIMARY CARE PHYSICIAN: Amanda Morgan, MD    ADMISSION DIAGNOSIS:  Dehydration [E86.0] Acute respiratory failure with hypoxia (HCC) [J96.01] Pneumonia due to COVID-19 virus [U07.1, J12.82] COVID-19 [U07.1]  DISCHARGE DIAGNOSIS:  Hypotension due to dehydration--improved COVID infection  SECONDARY DIAGNOSIS:   Past Medical History:  Diagnosis Date  . ADD (attention deficit disorder)   . Anxiety   . Arthritis    knees, shoulder, upper back  . Asthma   . CFS (chronic fatigue syndrome)   . Chewing difficulty   . CHF (congestive heart failure) (HCC)   . Chronic kidney disease   . Constipation   . Depression   . Depression   . Diabetes mellitus without complication (HCC)   . Dyspnea   . Environmental allergies   . Fatty liver   . Fibromyalgia   . GERD (gastroesophageal reflux disease)   . GI bleed 10/02/2018  . Headache    migraines - 5x/mo  . Hypertension   . Hypokalemia 10/14/2017  . Hypothyroidism   . IBS (irritable bowel syndrome)   . Joint pain   . Lower extremity edema   . Major depressive disorder, single episode   . Migraine without aura and without status migrainosus, not intractable   . Migraines   . Motion sickness    ships  . Osteoarthritis   . Other specified disorders of thyroid   . Sleep apnea   . Stroke [facility])    no residual deficits  . Swallowing difficulty   . Thyroid nodule    bilateral and goiter  . Vitamin D deficiency   . Wears contact lenses   . Wears dentures    partial upper    HOSPITAL COURSE:  Amanda [name] a 55 y.o.femalewith medical history significant ofcomplex history listed below that includes orthostatic hypotension for which she has been evaluated by endocrine. She wasdiagnosed with  COVID on 12/30 whohad progressive symptoms of weakness, myalgias, chest discomfort, cough, fatigue for which she called EMS. ON arrival EMT found SBP in th 50's, O2 sats 80%  1. Recent covid pneumonia - patient with h/o hypotension but now with Covid positive test 08/31/20,transient hypoxia--resolved now  -hypotension, mild renal insufficiency, myalgia, ground glass opacities at lung bases. She is beyond the window for antiviral agents. - cont Covid protocols -- patients has are more than 92% on room air. No respiratory distress noted today. Will switch to oral steroid. --Oxygen to keep sats >90% -- inflammatory marker stable.  2.Orthostatic hypotension/acute renal failure/poor PO intake  - a chronic problem related to recurrent dehydration and medications. She has been evalauted by Dr. Rachel [name] [facility]-endocrinology12/21/21for possible adrenal insufficiency which is thought unlikely. Hospitalizaions 10/09/19, 2/16,21, 10/24/19, 3/8,21, 12/06/19, with additional hospital visits since.She was hypotensive today and syncopal. Shedid respond to 3 L total of IVF. -- Patient adequately received IV fluids. She is able to tolerate orals. Her creatinine is back to baseline  3. COPD - continue home meds  4. HTN - will resume home meds at discharge.  5. GERD - continue PPI  6. HLD - continue home meds  7. DM - continue home meds plus ss due to steroids  PT recommends HHPT and walker  DVT prophylaxis:lovenox Code Status:full code Family Communication son Amanda [name]  Walker on the phone 1/12   Dispo: The patient is from: Home  Anticipated d/c is to: Home with HHPT  Anticipated d/c date is: today  Patient currently is improved and appears at baseline for d/c.      CONSULTS OBTAINED:    DRUG ALLERGIES:   Allergies  Allergen Reactions  . Penicillins Anaphylaxis    Has patient had a PCN reaction causing immediate rash,  facial/tongue/throat swelling, SOB or lightheadedness with hypotension: Yes Has patient had a PCN reaction causing severe rash involving mucus membranes or skin necrosis: No Has patient had a PCN reaction that required hospitalization No Has patient had a PCN reaction occurring within the last 10 years: No If all of the above answers are "NO", then may proceed with Cephalosporin use.  Has patient had a PCN reaction causing immediate rash, facial/tongue/throat swelling, SOB or lightheadedness with hypotension: Yes Has patient had a PCN reaction causing severe rash involving mucus membranes or skin necrosis: No Has patient had a PCN reaction that required hospitalization No Has patient had a PCN reaction occurring within the last 10 years: No If all of the above answers are "NO", then may proceed with Cephalosporin use. Other reaction(s): Other (See Comments) Severe/almost died Other reaction(s): ANAPHYLAXIS Has patient had a PCN reaction causing immediate rash, facial/tongue/throat swelling, SOB or lightheadedness with hypotension: Yes Has patient had a PCN reaction causing severe rash involving mucus membranes or skin necrosis: No Has patient had a PCN reaction that required hospitalization No Has patient had a PCN reaction occurring within the last 10 years: No If all of the above answers are "NO", then may proceed with Cephalosporin use. Other reaction(s): Other (See Comments) Severe/almost died Other reaction(s): ANAPHYLAXIS Has patient had a PCN reaction causing immediate rash, facial/tongue/throat swelling, SOB or lightheadedness with hypotension: Yes Has patient had a PCN reaction causing severe rash involving mucus membranes or skin necrosis: No Has patient had a PCN reaction that required hospitalization No Has patient had a PCN reaction occurring within the last 10 years: No If all of the above answers are "NO", then may proceed with Cephalospo... (TRUNCATED)  . Penicillins  Anaphylaxis  . Ace Inhibitors Swelling  . Ace Inhibitors Swelling    DISCHARGE MEDICATIONS:   Allergies as of 09/14/2020      Reactions   Penicillins Anaphylaxis   Has patient had a PCN reaction causing immediate rash, facial/tongue/throat swelling, SOB or lightheadedness with hypotension: Yes Has patient had a PCN reaction causing severe rash involving mucus membranes or skin necrosis: No Has patient had a PCN reaction that required hospitalization No Has patient had a PCN reaction occurring within the last 10 years: No If all of the above answers are "NO", then may proceed with Cephalosporin use. Has patient had a PCN reaction causing immediate rash, facial/tongue/throat swelling, SOB or lightheadedness with hypotension: Yes Has patient had a PCN reaction causing severe rash involving mucus membranes or skin necrosis: No Has patient had a PCN reaction that required hospitalization No Has patient had a PCN reaction occurring within the last 10 years: No If all of the above answers are "NO", then may proceed with Cephalosporin use. Other reaction(s): Other (See Comments) Severe/almost died Other reaction(s): ANAPHYLAXIS Has patient had a PCN reaction causing immediate rash, facial/tongue/throat swelling, SOB or lightheadedness with hypotension: Yes Has patient had a PCN reaction causing severe rash involving mucus membranes or skin necrosis: No Has patient had a PCN reaction that required hospitalization No Has patient had a  PCN reaction occurring within the last 10 years: No If all of the above answers are "NO", then may proceed with Cephalosporin use. Other reaction(s): Other (See Comments) Severe/almost died Other reaction(s): ANAPHYLAXIS Has patient had a PCN reaction causing immediate rash, facial/tongue/throat swelling, SOB or lightheadedness with hypotension: Yes Has patient had a PCN reaction causing severe rash involving mucus membranes or skin necrosis: No Has patient had a  PCN reaction that required hospitalization No Has patient had a PCN reaction occurring within the last 10 years: No If all of the above answers are "NO", then may proceed with Cephalospo... (TRUNCATED)   Penicillins Anaphylaxis   Ace Inhibitors Swelling   Ace Inhibitors Swelling      Medication List    STOP taking these medications   doxycycline 100 MG capsule Commonly known as: MONODOX   HumaLOG KwikPen 100 UNIT/ML KwikPen Generic drug: insulin lispro   hydrOXYzine 25 MG tablet Commonly known as: ATARAX/VISTARIL     TAKE these medications   B-D UF III MINI PEN NEEDLES 31G X 5 MM Misc Generic drug: Insulin Pen Needle Inject into the skin daily.   Insulin Pen Needle 31G X 6 MM Misc SMARTSIG:1 Needle SUB-Q 4 Times Daily   TRUEplus Pen Needles 31G X 6 MM Misc Generic drug: Insulin Pen Needle   BD Pen Needle Micro U/F 32G X 6 MM Misc Generic drug: Insulin Pen Needle Inject into the skin.   cariprazine capsule Commonly known as: VRAYLAR Take 3 mg by mouth daily.   cholecalciferol 25 MCG (1000 UNIT) tablet Commonly known as: VITAMIN D Take 1,000 Units by mouth daily.   dexmethylphenidate 15 MG 24 hr capsule Commonly known as: FOCALIN XR Take 1 capsule by mouth daily.   DULoxetine 30 MG capsule Commonly known as: CYMBALTA Take 30 mg by mouth daily.   estradiol 0.1 MG/GM vaginal cream Commonly known as: ESTRACE   ferrous sulfate 324 MG Tbec Take 324 mg by mouth daily.   flunisolide 25 MCG/ACT (0.025%) Soln Commonly known as: NASALIDE Place into the nose.   furosemide 20 MG tablet Commonly known as: LASIX Take 20 mg by mouth daily.   InPen 100-Gray-Lilly Devi Generic drug: injection device for insulin as directed.   ipratropium-albuterol 0.5-2.5 (3) MG/3ML Soln Commonly known as: DUONEB Inhale into the lungs.   Jardiance 25 MG Tabs tablet Generic drug: empagliflozin Take 25 mg by mouth daily.   lidocaine 5 % Commonly known as: LIDODERM lidocaine  5 % topical patch  PLEASE SEE ATTACHED FOR DETAILED DIRECTIONS   Linzess 290 MCG Caps capsule Generic drug: linaclotide Take 290 mcg by mouth daily.   liraglutide 18 MG/3ML Sopn Commonly known as: VICTOZA Inject 0.5 mLs (3 mg total) into the skin daily.   LORazepam 0.5 MG tablet Commonly known as: ATIVAN Take 0.5 mg by mouth 2 (two) times daily as needed.   losartan 50 MG tablet Commonly known as: COZAAR Hold until followup with outpatient doctor because your blood pressure was low on presentation to the hospital.   mirtazapine 30 MG disintegrating tablet Commonly known as: REMERON SOL-TAB Take 30 mg by mouth at bedtime as needed (Nap).   montelukast 10 MG tablet Commonly known as: SINGULAIR Take 10 mg by mouth daily.   NovoLOG FlexPen 100 UNIT/ML FlexPen Generic drug: insulin aspart Inject 10 Units into the skin daily.   omeprazole 40 MG capsule Commonly known as: PRILOSEC Take 40 mg by mouth daily.   potassium chloride SA 20 MEQ tablet Commonly known  as: KLOR-CON Take 20 mEq by mouth daily.   pravastatin 40 MG tablet Commonly known as: PRAVACHOL Take 40 mg by mouth daily.   predniSONE 10 MG tablet Commonly known as: DELTASONE Take 50 mg daily taper by 10 mg daily then stop   pregabalin 150 MG capsule Commonly known as: LYRICA Take 150 mg by mouth 3 (three) times daily.   ProAir RespiClick 108 (90 Base) MCG/ACT Aepb Generic drug: Albuterol Sulfate Inhale 2 puffs into the lungs every 4 (four) hours as needed.   tiZANidine 4 MG tablet Commonly known as: ZANAFLEX Take 4 mg by mouth every 8 (eight) hours as needed for muscle spasms.   topiramate 100 MG tablet Commonly known as: TOPAMAX Take 100 mg by mouth 2 (two) times daily.   Evaristo Bury FlexTouch 200 UNIT/ML FlexTouch Pen Generic drug: insulin degludec 72 units per day seems very high for you.  Please follow up with your outpatient provider for further adjustment.   Vitamin C CR 1000 MG Tbcr Take 1  tablet by mouth daily.   zolpidem 10 MG tablet Commonly known as: AMBIEN Take 10 mg by mouth at bedtime.            Durable Medical Equipment  (From admission, onward)         Start     Ordered   09/14/20 1000  DME Walker  Once       Question Answer Comment  Walker: With 5 Inch Wheels   Patient needs a walker to treat with the following condition Weakness      09/14/20 1002          If you experience worsening of your admission symptoms, develop shortness of breath, life threatening emergency, suicidal or homicidal thoughts you must seek medical attention immediately by calling 911 or calling your MD immediately  if symptoms less severe.  You Must read complete instructions/literature along with all the possible adverse reactions/side effects for all the Medicines you take and that have been prescribed to you. Take any new Medicines after you have completely understood and accept all the possible adverse reactions/side effects.   Please note  You were cared for by a hospitalist during your hospital stay. If you have any questions about your discharge medications or the care you received while you were in the hospital after you are discharged, you can call the unit and asked to speak with the hospitalist on call if the hospitalist that took care of you is not available. Once you are discharged, your primary care physician will handle any further medical issues. Please note that NO REFILLS for any discharge medications will be authorized once you are discharged, as it is imperative that you return to your primary care physician (or establish a relationship with a primary care physician if you do not have one) for your aftercare needs so that they can reassess your need for medications and monitor your lab values. Today   SUBJECTIVE   Out in the chair no complaints Ate good BF  VITAL SIGNS:  Blood pressure 107/70, pulse 60, temperature 99.1 F (37.3 C), temperature source  Oral, resp. rate 15, height 5\' 5"  (1.651 m), weight 104.3 kg, SpO2 100 %.  I/O:    Intake/Output Summary (Last 24 hours) at 09/14/2020 1003 Last data filed at 09/13/2020 1350 Gross per 24 hour  Intake 600 ml  Output -  Net 600 ml    PHYSICAL EXAMINATION:  GENERAL:  55 y.o.-year-old patient lying in the bed with  no acute distress. Obese LUNGS: Normal breath sounds bilaterally, no wheezing, rales,rhonchi or crepitation. No use of accessory muscles of respiration.  CARDIOVASCULAR: S1, S2 normal. No murmurs, rubs, or gallops.  ABDOMEN: Soft, non-tender, non-distended. Bowel sounds present. No organomegaly or mass.  EXTREMITIES: No pedal edema, cyanosis, or clubbing.  NEUROLOGIC: Cranial nerves II through XII are intact. Muscle strength 5/5 in all extremities. Sensation intact. Gait not checked.  PSYCHIATRIC: The patient is alert and oriented x 3.  SKIN: No obvious rash, lesion, or ulcer.   DATA REVIEW:   CBC  Recent Labs  Lab 09/12/20 1923  WBC 6.5  HGB 12.3  HCT 38.1  PLT 318    Chemistries  Recent Labs  Lab 09/13/20 0504  NA 138  K 4.3  CL 102  CO2 25  GLUCOSE 188*  BUN 13  CREATININE 0.79  CALCIUM 9.5  AST 40  ALT 54*  ALKPHOS 125  BILITOT 0.8    Microbiology Results   Recent Results (from the past 240 hour(s))  Blood culture (routine x 2)     Status: None (Preliminary result)   Collection Time: 09/12/20  7:23 PM   Specimen: BLOOD  Result Value Ref Range Status   Specimen Description BLOOD LEFT ANTECUBITAL  Final   Special Requests   Final    BOTTLES DRAWN AEROBIC AND ANAEROBIC Blood Culture adequate volume   Culture   Final    NO GROWTH 2 DAYS Performed at [facility], [address] [place], [place] [address]    Report Status PENDING  Incomplete  Blood culture (routine x 2)     Status: None (Preliminary result)   Collection Time: 09/12/20  7:23 PM   Specimen: BLOOD  Result Value Ref Range Status   Specimen Description BLOOD RIGHT  ANTECUBITAL  Final   Special Requests   Final    BOTTLES DRAWN AEROBIC AND ANAEROBIC Blood Culture adequate volume   Culture   Final    NO GROWTH 2 DAYS Performed at [facility], [address] , [place] [address]    Report Status PENDING  Incomplete    RADIOLOGY:  CT Angio Chest PE W and/or Wo Contrast  Result Date: 09/12/2020 CLINICAL DATA:  PE suspected, lethargy weakness and hypotension, history of COVID infection diagnosed on December 30th. EXAM: CT ANGIOGRAPHY CHEST WITH CONTRAST TECHNIQUE: Multidetector CT imaging of the chest was performed using the standard protocol during bolus administration of intravenous contrast. Multiplanar CT image reconstructions and MIPs were obtained to evaluate the vascular anatomy. CONTRAST:  OMNIPAQUE IOHEXOL 350 MG/ML SOLN COMPARISON:  September of 2021 FINDINGS: Cardiovascular: Normal caliber thoracic aorta. Heart size mildly enlarged with signs of pericardial fluid similar to the previous study from September of 2021. Central pulmonary vasculature of normal caliber. Lung base assessment is limited by respiratory motion the subsegmental level. No central, lobar or segmental level embolus. Mediastinum/Nodes: Patulous esophagus. No adenopathy in the thoracic inlet. No mediastinal lymphadenopathy no thoracic inlet adenopathy no hilar adenopathy. Lungs/Pleura: Areas of ground-glass attenuation at the lung bases. Linear atelectatic changes. No dense consolidation. No sign of pleural effusion. Upper Abdomen: Incidental imaging of upper abdominal contents shows low-attenuation lesions in the liver that are stable since the most recent comparison evaluation but are not definitely seen on the study of October 05, 2018. There is hepatic steatosis and liver contours or more lobular. Largest 1 measures water density on image 180 of series 5 and is stable at approximately 1.5 cm. Musculoskeletal: Spinal  degenerative changes. No acute or destructive  bone process. Review of the MIP images confirms the above findings. IMPRESSION: 1. Areas of ground-glass attenuation at the lung bases, may represent atelectasis or mild pneumonitis. 2. No evidence of pulmonary embolus to the lobar or segmental level. 3. Mild cardiomegaly with signs of pericardial fluid similar to the previous study from September of 2021. 4. Potential hepatic cysts but not seen in February of 2020. Profound hepatic steatosis at that time may not have allowed for detection. Suggest follow-up liver ultrasound as an initial means of further evaluating these findings as there may be underlying liver disease in this patient and this area is not well assessed. 5. Aortic atherosclerosis. Aortic Atherosclerosis (ICD10-I70.0). Electronically Signed   By: Donzetta KohutGeoffrey  Wile M.D.   On: 09/12/2020 21:15   DG Chest Portable 1 View  Result Date: 09/12/2020 CLINICAL DATA:  Shortness of breath, COVID EXAM: PORTABLE CHEST 1 VIEW COMPARISON:  07/04/2020 FINDINGS: Very low lung volumes. Mild cardiomegaly. Bibasilar atelectasis. No effusions or acute bony abnormality. IMPRESSION: Low lung volumes, bibasilar atelectasis. Electronically Signed   By: Charlett NoseKevin  Dover M.D.   On: 09/12/2020 20:05     CODE STATUS:     Code Status Orders  (From admission, onward)         Start     Ordered   09/12/20 2318  Full code  Continuous        09/12/20 2322        Code Status History    Date Active Date Inactive Code Status Order ID Comments User Context   07/04/2020 0102 07/04/2020 2031 Full Code [id number]  Andris Baumannuncan, Hazel V, MD ED   05/08/2020 0115 05/09/2020 2248 Full Code [id number]  Chotiner, Claudean SeveranceBradley S, MD ED   08/26/2019 1856 08/27/2019 1834 Full Code [id number]  Lurene ShadowAyiku, Bernard, MD Inpatient   10/10/2018 1614 10/13/2018 1939 Full Code [id number]  Shaune Pollackhen, Qing, MD Inpatient   10/02/2018 1708 10/06/2018 1859 Full Code [id number]  Houston SirenSainani, Vivek J, MD Inpatient   07/17/2018 1714 07/20/2018 1810 Full Code [id number]  Shaune Pollackhen, Qing, MD  Inpatient   03/28/2018 2134 03/30/2018 1557 Full Code [id number]  Enid BaasKalisetti, Radhika, MD Inpatient   10/14/2017 1002 10/17/2017 1341 Full Code [id number]  Eddie Northhungel, Nishant, MD Inpatient   06/03/2017 1631 06/06/2017 1556 Full Code [id number]  Adrian SaranMody, Sital, MD Inpatient   04/02/2017 1505 04/05/2017 1912 Full Code [id number]  Henrene DodgePiscoya, Jose, MD Inpatient   03/18/2016 1553 03/20/2016 1748 Full Code [id number]  Katha HammingKonidena, Snehalatha, MD ED   Advance Care Planning Activity       TOTAL TIME TAKING CARE OF THIS PATIENT: *35* minutes.    Enedina FinnerSona Tresia Revolorio M.D  [facility]    CC: Primary care physician; Amanda MorganAycock, Ngwe A, MD

## 2020-09-14 NOTE — TOC Initial Note (Signed)
Transition of Care [facility]) - Initial/Assessment Note    Patient Details  Name: Amanda [name] MRN: [id number] Date of Birth: [birth year]  Transition of Care [facility]) CM/SW Contact:    Trenton Founds, RN Phone Number: 09/14/2020, 10:50 AM  Clinical Narrative:   RNCM completed assessment by telephone. Patient was admitted to hospital due to increased lethargy, hypotension with recent diagnosis of Covid on 12/30. Patient reports to feeling better at this time, she reports that she feels ready to go home and that her daughter will be picking her up. Patient reports that she is agreeable to having home health set up and does not have a preference to agency. She reports that she does wish to get rolling walker ordered. RNCM reached out to [place] with [facility] and she will accept for home health.  RNCM reached out to [facility] with [employer] for rolling walker.                 Expected Discharge Plan: Home w Home Health Services Barriers to Discharge: No Barriers Identified   Patient Goals and CMS Choice        Expected Discharge Plan and Services Expected Discharge Plan: Home w Home Health Services     Post Acute Care Choice: Home Health Living arrangements for the past 2 months: Single Family Home Expected Discharge Date: 09/14/20               DME Arranged: [name] rolling DME Agency: [employer] Date DME Agency Contacted: 09/14/20 Time DME Agency Contacted: 1030 Representative spoke with at DME Agency: Tawny Asal Arranged: PT HH Agency: [facility] Date HH Agency Contacted: 09/14/20 Time HH Agency Contacted: 1049 Representative spoke with at [facility] [place] Agency: [place]  Prior Living Arrangements/Services Living arrangements for the past 2 months: Single Family Home Lives with:: Self Patient language and need for interpreter reviewed:: Yes Do you feel safe going back to the place where you live?: Yes      Need for Family Participation in Patient Care: Yes (Comment) Care giver support system  in place?: Yes (comment)   Criminal Activity/Legal Involvement Pertinent to Current Situation/Hospitalization: No - Comment as needed  Activities of Daily Living Home Assistive Devices/Equipment: None ADL Screening (condition at time of admission) Patient's cognitive ability adequate to safely complete daily activities?: Yes Is the patient deaf or have difficulty hearing?: No Does the patient have difficulty seeing, even when wearing glasses/contacts?: No Does the patient have difficulty concentrating, remembering, or making decisions?: No Patient able to express need for assistance with ADLs?: Yes Does the patient have difficulty dressing or bathing?: Yes Independently performs ADLs?: No Communication: Independent Dressing (OT): Independent Grooming: Independent Feeding: Independent Bathing: Needs assistance Is this a change from baseline?: Pre-admission baseline Toileting: Independent In/Out Bed: Independent Walks in Home: Independent Does the patient have difficulty walking or climbing stairs?: Yes Weakness of Legs: Both Weakness of Arms/Hands: None  Permission Sought/Granted                  Emotional Assessment       Orientation: : Oriented to Self,Oriented to Place,Oriented to  Time,Oriented to Situation Alcohol / Substance Use: Not Applicable Psych Involvement: No (comment)  Admission diagnosis:  Dehydration [E86.0] Acute respiratory failure with hypoxia (HCC) [J96.01] Pneumonia due to COVID-19 virus [U07.1, J12.82] COVID-19 [U07.1] Patient Active Problem List   Diagnosis Date Noted  . COVID-19   . Pneumonia due to COVID-19 virus 09/12/2020  . Hypoglycemia 07/04/2020  . Acute renal failure (HCC) 07/04/2020  .  Dehydration 07/04/2020  . COPD (chronic obstructive pulmonary disease) (HCC) 07/04/2020  . Chest pain 05/07/2020  . Hypotension 05/07/2020  . Recurrent syncope 05/07/2020  . Type 2 diabetes mellitus without complication (HCC) 05/07/2020  . Asthma  05/07/2020  . Chronic congestive heart failure (HCC) 05/07/2020  . Epilepsy (HCC) 01/12/2020  . Vitamin D deficiency 01/12/2020  . Class 2 severe obesity with serious comorbidity and body mass index (BMI) of 36.0 to 36.9 in adult [address]) 01/12/2020  . Syncope 08/27/2019  . Syncope and collapse 08/26/2019  . Asthma 07/13/2019  . Hypotension 10/10/2018  . Acute on chronic diastolic (congestive) heart failure (HCC) 07/17/2018  . Acute on chronic heart failure (HCC) 07/08/2018  . Chronic diastolic heart failure (HCC) 04/17/2018  . Lymphedema 04/17/2018  . Chest pain 03/28/2018  . Hypertension 10/15/2017  . Wears dentures 10/15/2017  . Symptomatic cholelithiasis 04/02/2017  . Recurrent incisional hernia 03/17/2017  . Calculus of gallbladder without cholecystitis without obstruction 03/17/2017  . Helicobacter pylori gastritis 03/17/2017  . Depression 09/28/2016  . Diabetes mellitus, type 2 (HCC) 09/28/2016  . Acute respiratory failure with hypoxia (HCC) 03/18/2016  . Tremor 07/01/2013  . Esophageal reflux 06/22/2012  . Major depressive disorder, single episode 08/22/2009  . Sleep apnea 05/30/2009  . Hyperlipidemia 08/10/2008   PCP:  Emogene Morgan, MD Pharmacy:   [facility] - [place], [place] - [address] [place] [address] Phone: [phone] Fax: [phone]  [id number] - [place], [place] - [address] [place] [address] Phone: [phone] Fax: [phone]     Social Determinants of Health (SDOH) Interventions    Readmission Risk Interventions No flowsheet data found.

## 2020-09-15 DIAGNOSIS — I951 Orthostatic hypotension: Secondary | ICD-10-CM | POA: Diagnosis not present

## 2020-09-15 DIAGNOSIS — E1122 Type 2 diabetes mellitus with diabetic chronic kidney disease: Secondary | ICD-10-CM | POA: Diagnosis not present

## 2020-09-15 DIAGNOSIS — J1282 Pneumonia due to coronavirus disease 2019: Secondary | ICD-10-CM | POA: Diagnosis not present

## 2020-09-15 DIAGNOSIS — J440 Chronic obstructive pulmonary disease with acute lower respiratory infection: Secondary | ICD-10-CM | POA: Diagnosis not present

## 2020-09-15 DIAGNOSIS — N189 Chronic kidney disease, unspecified: Secondary | ICD-10-CM | POA: Diagnosis not present

## 2020-09-15 DIAGNOSIS — I890 Lymphedema, not elsewhere classified: Secondary | ICD-10-CM | POA: Diagnosis not present

## 2020-09-15 DIAGNOSIS — U071 COVID-19: Secondary | ICD-10-CM | POA: Diagnosis not present

## 2020-09-15 DIAGNOSIS — I130 Hypertensive heart and chronic kidney disease with heart failure and stage 1 through stage 4 chronic kidney disease, or unspecified chronic kidney disease: Secondary | ICD-10-CM | POA: Diagnosis not present

## 2020-09-15 DIAGNOSIS — J9691 Respiratory failure, unspecified with hypoxia: Secondary | ICD-10-CM | POA: Diagnosis not present

## 2020-09-15 DIAGNOSIS — I5032 Chronic diastolic (congestive) heart failure: Secondary | ICD-10-CM | POA: Diagnosis not present

## 2020-09-17 LAB — CULTURE, BLOOD (ROUTINE X 2)
Culture: NO GROWTH
Culture: NO GROWTH
Special Requests: ADEQUATE
Special Requests: ADEQUATE

## 2020-09-18 ENCOUNTER — Other Ambulatory Visit: Payer: Self-pay | Admitting: *Deleted

## 2020-09-18 NOTE — Patient Outreach (Signed)
[facility]) Care Management  09/18/2020  Amanda [name] 24-Jul-1966 [id number]   THNunsuccessful outreach to complex care patient post hospital stay Amanda [name] was referred to [facility] on 07/04/20 by MD Referral Reason:Per notes on 07/04/20 patient reported she was unable to afford her medications most of the time Expensive medications identified are victoza, Jardinace, Breo,Latuda and Linzess Recurrent syncopal events related to medication 07/10/20 [facility] CMA confirmed on APL  Insurance:[employer] Medicare and Medicaid [name] access Last admission/ED 09/12/20-09/14/20 acute respiratory failure with hypoxia, pneumonia due to covid 19 virus, dehydration  07/19/20 ED for syncope, loss of consciousness, hypotension 07/03/20- 07/04/20 for recurrent syncope, depression, HTN, CHF   lst outreach was on 09/14/20 09/14/20 Pt confirms she had just been discharged around time of outreach, had not made it home [name] RN CM and Pt agreed to reschedule outreach   [name] Unsuccessful outreach Outreach attempt to the home number  No answer. [facility] RN CM left HIPAA [name] Portability and Accountability Act) compliant voicemail message along with CM's contact info.   Plan: [facility] RN CM scheduled this patient for another call attempt within 4-7 business days  Amanda [name] L. Lavina Hamman, RN, BSN, [place] Coordinator Office number [phone] Mobile number [phone]  Main [facility] number [phone] Fax number [phone]

## 2020-09-20 DIAGNOSIS — F900 Attention-deficit hyperactivity disorder, predominantly inattentive type: Secondary | ICD-10-CM | POA: Diagnosis not present

## 2020-09-20 DIAGNOSIS — J9691 Respiratory failure, unspecified with hypoxia: Secondary | ICD-10-CM | POA: Diagnosis not present

## 2020-09-20 DIAGNOSIS — E1122 Type 2 diabetes mellitus with diabetic chronic kidney disease: Secondary | ICD-10-CM | POA: Diagnosis not present

## 2020-09-20 DIAGNOSIS — I890 Lymphedema, not elsewhere classified: Secondary | ICD-10-CM | POA: Diagnosis not present

## 2020-09-20 DIAGNOSIS — U071 COVID-19: Secondary | ICD-10-CM | POA: Diagnosis not present

## 2020-09-20 DIAGNOSIS — N189 Chronic kidney disease, unspecified: Secondary | ICD-10-CM | POA: Diagnosis not present

## 2020-09-20 DIAGNOSIS — J1282 Pneumonia due to coronavirus disease 2019: Secondary | ICD-10-CM | POA: Diagnosis not present

## 2020-09-20 DIAGNOSIS — F315 Bipolar disorder, current episode depressed, severe, with psychotic features: Secondary | ICD-10-CM | POA: Diagnosis not present

## 2020-09-20 DIAGNOSIS — J440 Chronic obstructive pulmonary disease with acute lower respiratory infection: Secondary | ICD-10-CM | POA: Diagnosis not present

## 2020-09-20 DIAGNOSIS — I951 Orthostatic hypotension: Secondary | ICD-10-CM | POA: Diagnosis not present

## 2020-09-20 DIAGNOSIS — F411 Generalized anxiety disorder: Secondary | ICD-10-CM | POA: Diagnosis not present

## 2020-09-20 DIAGNOSIS — I5032 Chronic diastolic (congestive) heart failure: Secondary | ICD-10-CM | POA: Diagnosis not present

## 2020-09-20 DIAGNOSIS — I130 Hypertensive heart and chronic kidney disease with heart failure and stage 1 through stage 4 chronic kidney disease, or unspecified chronic kidney disease: Secondary | ICD-10-CM | POA: Diagnosis not present

## 2020-09-20 DIAGNOSIS — R69 Illness, unspecified: Secondary | ICD-10-CM | POA: Diagnosis not present

## 2020-09-24 ENCOUNTER — Emergency Department
Admission: EM | Admit: 2020-09-24 | Discharge: 2020-09-24 | Disposition: A | Discharge status: Home / Self Care | Payer: Medicare HMO | Attending: Emergency Medicine | Admitting: Emergency Medicine

## 2020-09-24 ENCOUNTER — Emergency Department: Payer: Medicare HMO

## 2020-09-24 ENCOUNTER — Other Ambulatory Visit: Payer: Self-pay

## 2020-09-24 DIAGNOSIS — Z743 Need for continuous supervision: Secondary | ICD-10-CM | POA: Diagnosis not present

## 2020-09-24 DIAGNOSIS — Z8616 Personal history of COVID-19: Secondary | ICD-10-CM | POA: Diagnosis not present

## 2020-09-24 DIAGNOSIS — I10 Essential (primary) hypertension: Secondary | ICD-10-CM | POA: Diagnosis not present

## 2020-09-24 DIAGNOSIS — Z79899 Other long term (current) drug therapy: Secondary | ICD-10-CM | POA: Diagnosis not present

## 2020-09-24 DIAGNOSIS — I130 Hypertensive heart and chronic kidney disease with heart failure and stage 1 through stage 4 chronic kidney disease, or unspecified chronic kidney disease: Secondary | ICD-10-CM | POA: Insufficient documentation

## 2020-09-24 DIAGNOSIS — J449 Chronic obstructive pulmonary disease, unspecified: Secondary | ICD-10-CM | POA: Insufficient documentation

## 2020-09-24 DIAGNOSIS — E1122 Type 2 diabetes mellitus with diabetic chronic kidney disease: Secondary | ICD-10-CM | POA: Diagnosis not present

## 2020-09-24 DIAGNOSIS — Z8673 Personal history of transient ischemic attack (TIA), and cerebral infarction without residual deficits: Secondary | ICD-10-CM | POA: Insufficient documentation

## 2020-09-24 DIAGNOSIS — I517 Cardiomegaly: Secondary | ICD-10-CM | POA: Diagnosis not present

## 2020-09-24 DIAGNOSIS — Z794 Long term (current) use of insulin: Secondary | ICD-10-CM | POA: Diagnosis not present

## 2020-09-24 DIAGNOSIS — R059 Cough, unspecified: Secondary | ICD-10-CM | POA: Diagnosis not present

## 2020-09-24 DIAGNOSIS — E039 Hypothyroidism, unspecified: Secondary | ICD-10-CM | POA: Diagnosis not present

## 2020-09-24 DIAGNOSIS — N189 Chronic kidney disease, unspecified: Secondary | ICD-10-CM | POA: Diagnosis not present

## 2020-09-24 DIAGNOSIS — J189 Pneumonia, unspecified organism: Secondary | ICD-10-CM | POA: Diagnosis not present

## 2020-09-24 DIAGNOSIS — R569 Unspecified convulsions: Secondary | ICD-10-CM | POA: Insufficient documentation

## 2020-09-24 DIAGNOSIS — G40909 Epilepsy, unspecified, not intractable, without status epilepticus: Secondary | ICD-10-CM | POA: Diagnosis not present

## 2020-09-24 DIAGNOSIS — I5033 Acute on chronic diastolic (congestive) heart failure: Secondary | ICD-10-CM | POA: Diagnosis not present

## 2020-09-24 DIAGNOSIS — G4489 Other headache syndrome: Secondary | ICD-10-CM | POA: Diagnosis not present

## 2020-09-24 LAB — URINE DRUG SCREEN, QUALITATIVE (ARMC ONLY)
Amphetamines, Ur Screen: NOT DETECTED
Barbiturates, Ur Screen: NOT DETECTED
Benzodiazepine, Ur Scrn: NOT DETECTED
Cannabinoid 50 Ng, Ur ~~LOC~~: NOT DETECTED
Cocaine Metabolite,Ur ~~LOC~~: NOT DETECTED
MDMA (Ecstasy)Ur Screen: NOT DETECTED
Methadone Scn, Ur: NOT DETECTED
Opiate, Ur Screen: NOT DETECTED
Phencyclidine (PCP) Ur S: NOT DETECTED
Tricyclic, Ur Screen: NOT DETECTED

## 2020-09-24 LAB — URINALYSIS, COMPLETE (UACMP) WITH MICROSCOPIC
Bilirubin Urine: NEGATIVE
Glucose, UA: >=500 mg/dL — AB
Hgb urine dipstick: NEGATIVE
Ketones, ur: NEGATIVE mg/dL
Nitrite: NEGATIVE
Protein, ur: NEGATIVE mg/dL
Specific Gravity, Urine: 1.002 — ABNORMAL LOW (ref 1.005–1.030)
pH: 7 (ref 5.0–8.0)

## 2020-09-24 LAB — CBC
HCT: 40.7 % (ref 36.0–46.0)
Hemoglobin: 13 g/dL (ref 12.0–15.0)
MCH: 28.6 pg (ref 26.0–34.0)
MCHC: 31.9 g/dL (ref 30.0–36.0)
MCV: 89.5 fL (ref 80.0–100.0)
Platelets: 265 10*3/uL (ref 150–400)
RBC: 4.55 MIL/uL (ref 3.87–5.11)
RDW: 15.2 % (ref 11.5–15.5)
WBC: 8.7 10*3/uL (ref 4.0–10.5)
nRBC: 0 % (ref 0.0–0.2)

## 2020-09-24 LAB — BASIC METABOLIC PANEL
Anion gap: 13 (ref 5–15)
BUN: 14 mg/dL (ref 6–20)
CO2: 25 mmol/L (ref 22–32)
Calcium: 9.8 mg/dL (ref 8.9–10.3)
Chloride: 99 mmol/L (ref 98–111)
Creatinine, Ser: 0.86 mg/dL (ref 0.44–1.00)
GFR, Estimated: >60 mL/min (ref >60)
Glucose, Bld: 150 mg/dL — ABNORMAL HIGH (ref 70–99)
Potassium: 3.4 mmol/L — ABNORMAL LOW (ref 3.5–5.1)
Sodium: 137 mmol/L (ref 135–145)

## 2020-09-24 NOTE — ED Triage Notes (Signed)
Pt alert and oriented, no seizure activity noted. Pt speaking in complete sentences.

## 2020-09-24 NOTE — ED Notes (Signed)
Pt dx with covid 1.5 weeks ago. States seizure witnessed by family today. AOx4 at this time.

## 2020-09-24 NOTE — ED Notes (Signed)
Pt to ED via [facility] from home for Seizure that lasted about 30 seconds per family. Pt has Hx/o same, last seizure was 1 month ago. Pt takes Topamax. Per EMS pt was [facility] O when they arrived. VSS.

## 2020-09-24 NOTE — ED Provider Notes (Signed)
[facility] Emergency Department Provider Note  ____________________________________________  Time seen: Approximately 12:12 PM  I have reviewed the triage vital signs and the nursing notes.   HISTORY  Chief Complaint Seizures    HPI Amanda [name] is a 55 y.o. female who presents emergency department for complaint of seizure-like activity.  Patient states that she had a witnessed seizure this afternoon.  Patient states that she was at home, sitting on her couch when she reportedly had a tonic-clonic type seizure.  Patient has a history of seizures, is on Topamax for same.  Patient states that she will typically have 1-2 seizures a month.  This has been ongoing and sounds like for at least a year.  Patient states that she has a neurolog[facility] but has not seen them in over a year.  Patient states that she is still taking her Topamax as prescribed.  Patient has recently been admitted and discharged from the hospital for COVID-pneumonia.  She states that she no longer has fevers, chills, nasal congestion but is still having some coughing and shortness of breath.  No reports of abdominal pain.  Patient states that she did not have any trauma from today seizure.  Patient feels very tired but has no other significant complaints at this time.  She denies any headache, visual changes, weakness on one side of the body or the other, chest pain, shortness of breath, abdominal pain, nausea or vomiting.  Other than her daily medications, no medications for her current complaint.  Patient has a history of ADD, asthma, CHF, CKD, diabetes, GERD, hypertension, CVA, sleep apnea, hypothyroidism.         Past Medical History:  Diagnosis Date  . ADD (attention deficit disorder)   . Anxiety   . Arthritis    knees, shoulder, upper back  . Asthma   . CFS (chronic fatigue syndrome)   . Chewing difficulty   . CHF (congestive heart failure) (HCC)   . Chronic kidney disease   .  Constipation   . Depression   . Depression   . Diabetes mellitus without complication (HCC)   . Dyspnea   . Environmental allergies   . Fatty liver   . Fibromyalgia   . GERD (gastroesophageal reflux disease)   . GI bleed 10/02/2018  . Headache    migraines - 5x/mo  . Hypertension   . Hypokalemia 10/14/2017  . Hypothyroidism   . IBS (irritable bowel syndrome)   . Joint pain   . Lower extremity edema   . Major depressive disorder, single episode   . Migraine without aura and without status migrainosus, not intractable   . Migraines   . Motion sickness    ships  . Osteoarthritis   . Other specified disorders of thyroid   . Sleep apnea   . Stroke [facility](HCC)    no residual deficits  . Swallowing difficulty   . Thyroid nodule    bilateral and goiter  . Vitamin D deficiency   . Wears contact lenses   . Wears dentures    partial upper    Patient Active Problem List   Diagnosis Date Noted  . COVID-19   . Pneumonia due to COVID-19 virus 09/12/2020  . Hypoglycemia 07/04/2020  . Acute renal failure (HCC) 07/04/2020  . Dehydration 07/04/2020  . COPD (chronic obstructive pulmonary disease) (HCC) 07/04/2020  . Chest pain 05/07/2020  . Hypotension 05/07/2020  . Recurrent syncope 05/07/2020  . Type 2 diabetes mellitus without complication (HCC) 05/07/2020  .  Asthma 05/07/2020  . Chronic congestive heart failure (HCC) 05/07/2020  . Epilepsy (HCC) 01/12/2020  . Vitamin D deficiency 01/12/2020  . Class 2 severe obesity with serious comorbidity and body mass index (BMI) of 36.0 to 36.9 in adult [facility]) 01/12/2020  . Syncope 08/27/2019  . Syncope and collapse 08/26/2019  . Asthma 07/13/2019  . Hypotension 10/10/2018  . Acute on chronic diastolic (congestive) heart failure (HCC) 07/17/2018  . Acute on chronic heart failure (HCC) 07/08/2018  . Chronic diastolic heart failure (HCC) 04/17/2018  . Lymphedema 04/17/2018  . Chest pain 03/28/2018  . Hypertension 10/15/2017  . Wears dentures  10/15/2017  . Symptomatic cholelithiasis 04/02/2017  . Recurrent incisional hernia 03/17/2017  . Calculus of gallbladder without cholecystitis without obstruction 03/17/2017  . Helicobacter pylori gastritis 03/17/2017  . Depression 09/28/2016  . Diabetes mellitus, type 2 (HCC) 09/28/2016  . Acute respiratory failure with hypoxia (HCC) 03/18/2016  . Tremor 07/01/2013  . Esophageal reflux 06/22/2012  . Major depressive disorder, single episode 08/22/2009  . Sleep apnea 05/30/2009  . Hyperlipidemia 08/10/2008    Past Surgical History:  Procedure Laterality Date  . ABDOMINAL HYSTERECTOMY  2000's  . ABDOMINAL HYSTERECTOMY    . ABDOMINAL SURGERY     laparoscopy x4 with lysis of adhesions  . APPENDECTOMY  1986  . CESAREAN SECTION  1992  . CHOLECYSTECTOMY N/A 04/03/2017   Procedure: LAPAROSCOPIC CHOLECYSTECTOMY;  Surgeon: Henrene Dodge, MD;  Location: [facility] ORS;  Service: General;  Laterality: N/A;  . COLONOSCOPY N/A 10/06/2018   Procedure: COLONOSCOPY;  Surgeon: Toney Reil, MD;  Location: [facility] ENDOSCOPY;  Service: Gastroenterology;  Laterality: N/A;  . COLONOSCOPY WITH PROPOFOL N/A 03/10/2017   Procedure: COLONOSCOPY WITH PROPOFOL;  Surgeon: Scot Jun, MD;  Location: [facility] ENDOSCOPY;  Service: Endoscopy;  Laterality: N/A;  . ECTOPIC PREGNANCY SURGERY  2000's  . ESOPHAGOGASTRODUODENOSCOPY (EGD) WITH PROPOFOL N/A 03/10/2017   Procedure: ESOPHAGOGASTRODUODENOSCOPY (EGD) WITH PROPOFOL;  Surgeon: Scot Jun, MD;  Location: [facility] ENDOSCOPY;  Service: Endoscopy;  Laterality: N/A;  . HERNIA REPAIR    . JOINT REPLACEMENT Right 12/16/12   knee- medial - makoplasty  . KNEE ARTHROSCOPY Right 2006   partial medial and lateral meniscectomies  . KNEE ARTHROSCOPY Right 10/06/2015   Procedure: RIGHT KNEE ARTHROSCOPY WITH DEBRIDEMENT;  Surgeon: Erin Sons, MD;  Location: [facility];  Service: Orthopedics;  Laterality: Right;  Diabetic - insulin and oral meds  . ROTATOR CUFF  REPAIR Left 2006  . TUBAL LIGATION  2000's    Prior to Admission medications   Medication Sig Start Date End Date Taking? Authorizing Provider  Ascorbic Acid (VITAMIN C CR) 1000 MG TBCR Take 1 tablet by mouth daily.    [provider]  B-D UF III MINI PEN NEEDLES 31G X 5 MM MISC Inject into the skin daily. 03/18/20   [provider]  BD PEN NEEDLE MICRO U/F 32G X 6 MM MISC Inject into the skin. 06/14/20   [provider]  cariprazine (VRAYLAR) capsule Take 3 mg by mouth daily. 07/17/20   [provider]  cholecalciferol (VITAMIN D) 25 MCG (1000 UNIT) tablet Take 1,000 Units by mouth daily. 05/01/20   [provider]  dexmethylphenidate (FOCALIN XR) 15 MG 24 hr capsule Take 1 capsule by mouth daily. 07/14/20   [provider]  DULoxetine (CYMBALTA) 30 MG capsule Take 30 mg by mouth daily.  Patient not taking: Reported on 08/15/2020 06/07/20   [provider]  estradiol (ESTRACE) 0.1 MG/GM vaginal  cream  10/15/19   [provider]  ferrous sulfate 324 MG TBEC Take 324 mg by mouth daily. 08/22/20   [provider]  flunisolide (NASALIDE) 25 MCG/ACT (0.025%) SOLN Place into the nose.    [provider]  furosemide (LASIX) 20 MG tablet Take 20 mg by mouth daily. 07/24/20   [provider]  INPEN 100-GRAY-LILLY DEVI as directed. 06/13/20   [provider]  Insulin Pen Needle 31G X 6 MM MISC SMARTSIG:1 Needle SUB-Q 4 Times Daily 05/19/20   [provider]  ipratropium-albuterol (DUONEB) 0.5-2.5 (3) MG/3ML SOLN Inhale into the lungs.    [provider]  JARDIANCE 25 MG TABS tablet Take 25 mg by mouth daily. 03/18/20   [provider]  lidocaine (LIDODERM) 5 % lidocaine 5 % topical patch  PLEASE SEE ATTACHED FOR DETAILED DIRECTIONS 08/16/19   [provider]  LINZESS 290 MCG CAPS capsule Take 290 mcg by mouth daily. 04/12/20   [provider]  liraglutide  (VICTOZA) 18 MG/3ML SOPN Inject 0.5 mLs (3 mg total) into the skin daily. 01/11/20   Whitmire, Thermon Leylandawn W, FNP  LORazepam (ATIVAN) 0.5 MG tablet Take 0.5 mg by mouth 2 (two) times daily as needed. 02/29/20   [provider]  losartan (COZAAR) 50 MG tablet Hold until followup with outpatient doctor because your blood pressure was low on presentation to the hospital. 07/04/20   Darlin PriestlyLai, Tina, MD  mirtazapine (REMERON SOL-TAB) 30 MG disintegrating tablet Take 30 mg by mouth at bedtime as needed (Nap).  03/18/18   [provider]  montelukast (SINGULAIR) 10 MG tablet Take 10 mg by mouth daily. 04/12/20   [provider]  NOVOLOG FLEXPEN 100 UNIT/ML FlexPen Inject 10 Units into the skin daily. 05/22/20   [provider]  omeprazole (PRILOSEC) 40 MG capsule Take 40 mg by mouth daily. 04/12/20   [provider]  potassium chloride SA (KLOR-CON) 20 MEQ tablet Take 20 mEq by mouth daily. 04/12/20   [provider]  pravastatin (PRAVACHOL) 40 MG tablet Take 40 mg by mouth daily. 04/12/20   [provider]  predniSONE (DELTASONE) 10 MG tablet Take 50 mg daily taper by 10 mg daily then stop 09/14/20   Enedina FinnerPatel, Sona, MD  pregabalin (LYRICA) 150 MG capsule Take 150 mg by mouth 3 (three) times daily. 04/12/20   [provider]  PROAIR RESPICLICK 108 (90 Base) MCG/ACT AEPB Inhale 2 puffs into the lungs every 4 (four) hours as needed. 11/12/19   [provider]  tiZANidine (ZANAFLEX) 4 MG tablet Take 4 mg by mouth every 8 (eight) hours as needed for muscle spasms.     [provider]  topiramate (TOPAMAX) 100 MG tablet Take 100 mg by mouth 2 (two) times daily. 04/12/20   [provider]  TRESIBA FLEXTOUCH 200 UNIT/ML FlexTouch Pen 72 units per day seems very high for you.  Please follow up with your outpatient provider for further adjustment. 07/04/20   Darlin PriestlyLai, Tina, MD  TRUEPLUS PEN NEEDLES 31G X 6 MM MISC  05/22/20   [provider]   zolpidem (AMBIEN) 10 MG tablet Take 10 mg by mouth at bedtime. 06/23/20   [provider]    Allergies Penicillins, Penicillins, Ace inhibitors, and Ace inhibitors  Family History  Problem Relation Age of Onset  . Hypertension Mother   . Diabetes Mother   . Heart disease Mother   . Depression Mother   . Obesity Mother   . Hypertension  Father   . Diabetes Father   . Allergies Father   . Kidney disease Father   . Cancer Father   . Breast cancer Neg Hx     Social History Social History   Tobacco Use  . Smoking status: Never Smoker  . Smokeless tobacco: Never Used  Vaping Use  . Vaping Use: Never used  Substance Use Topics  . Alcohol use: Not Currently  . Drug use: Never     Review of Systems  Constitutional: No fever/chills Eyes: No visual changes. No discharge ENT: No upper respiratory complaints. Cardiovascular: no chest pain. Respiratory: Ongoing cough. No SOB. Gastrointestinal: No abdominal pain.  No nausea, no vomiting.  No diarrhea.  No constipation. Genitourinary: Negative for dysuria. No hematuria Musculoskeletal: Negative for musculoskeletal pain. Skin: Negative for rash, abrasions, lacerations, ecchymosis. Neurological: Positive for witnessed seizure today.  Negative for headaches, focal weakness or numbness.  10 System ROS otherwise negative.  ____________________________________________   PHYSICAL EXAM:  VITAL SIGNS: ED Triage Vitals  Enc Vitals Group     BP 09/24/20 1032 105/80     Pulse Rate 09/24/20 1032 77     Resp 09/24/20 1032 16     Temp 09/24/20 1032 98.4 F (36.9 C)     Temp Source 09/24/20 1032 Oral     SpO2 09/24/20 1032 97 %     Weight 09/24/20 1035 210 lb (95.3 kg)     Height 09/24/20 1035 5\' 5"  (1.651 m)     Head Circumference --      Peak Flow --      Pain Score 09/24/20 1035 4     Pain Loc --      Pain Edu? --      Excl. in GC? --      Constitutional: Alert and oriented.  Patient appears tired but generally  well appearing and in no acute distress. Eyes: Conjunctivae are normal. PERRL. EOMI. Head: Atraumatic. ENT:      Ears:       Nose: No congestion/rhinnorhea.      Mouth/Throat: Mucous membranes are moist.  Neck: No stridor.  Neck is nontender to palpation and supple with full range of motion Hematological/Lymphatic/Immunilogical: No cervical lymphadenopathy. Cardiovascular: Normal rate, regular rhythm. Normal S1 and S2.  Good peripheral circulation. Respiratory: Normal respiratory effort without tachypnea or retractions. Lungs with some crackles in the left lower lung field.  No wheezing.  Good air entry to the bases with no decreased or absent breath sounds. Gastrointestinal: Bowel sounds 4 quadrants. Soft and nontender to palpation. No guarding or rigidity. No palpable masses. No distention.  Musculoskeletal: Full range of motion to all extremities. No gross deformities appreciated. Neurologic:  Normal speech and language. No gross focal neurologic deficits are appreciated.  Cranial nerves II through XII grossly intact. Skin:  Skin is warm, dry and intact. No rash noted. Psychiatric: Mood and affect are normal. Speech and behavior are normal. Patient exhibits appropriate insight and judgement.   ____________________________________________   LABS (all labs ordered are listed, but only abnormal results are displayed)  Labs Reviewed  BASIC METABOLIC PANEL - Abnormal; Notable for the following components:      Result Value   Potassium 3.4 (*)    Glucose, Bld 150 (*)    All other components within normal limits  URINALYSIS, COMPLETE (UACMP) WITH MICROSCOPIC - Abnormal; Notable for the following components:   Color, Urine STRAW (*)    APPearance CLEAR (*)    Specific Gravity, Urine 1.002 (*)  Glucose, UA >=500 (*)    Leukocytes,Ua SMALL (*)    Bacteria, UA RARE (*)    All other components within normal limits  CBC  URINE DRUG SCREEN, QUALITATIVE ([facility] ONLY)  TOPIRAMATE LEVEL   CBG MONITORING, ED   ____________________________________________  EKG   ____________________________________________  RADIOLOGY I personally viewed and evaluated these images as part of my medical decision making, as well as reviewing the written report by the radiologist.  ED Provider Interpretation: No evidence of acute cardiopulmonary finding.  Specifically no residual infiltrates from her COVID-pneumonia  DG Chest 2 View  Result Date: 09/24/2020 CLINICAL DATA:  55 year old female with history of COVID pneumonia with persistent cough. Seizure. EXAM: CHEST - 2 VIEW COMPARISON:  Chest x-ray 09/12/2020. FINDINGS: Lung volumes are low. No consolidative airspace disease. No pleural effusions. No pneumothorax. No evidence of pulmonary edema. Heart size is mildly enlarged. The patient is rotated to the right on today's exam, resulting in distortion of the mediastinal contours and reduced diagnostic sensitivity and specificity for mediastinal pathology. IMPRESSION: 1. Low lung volumes without radiographic evidence of acute cardiopulmonary disease. 2. Mild cardiomegaly. Electronically Signed   By: Trudie Reed M.D.   On: 09/24/2020 13:55    ____________________________________________    PROCEDURES  Procedure(s) performed:    Procedures    Medications - No data to display   ____________________________________________   INITIAL IMPRESSION / ASSESSMENT AND PLAN / ED COURSE  Pertinent labs & imaging results that were available during my care of the patient were reviewed by me and considered in my medical decision making (see chart for details).  Review of the [place] CSRS was performed in accordance of the [facility] prior to dispensing any controlled drugs.           Patient's diagnosis is consistent with seizure.  Patient presented to emergency department after having a seizure today.  Patient does have a history of seizures and apparently has been having seizure-like activity  over the past year.  Patient is on Topamax but has had some other health changes as well as increased weight gain and the concern is that patient is likely undermedicated.  Has had seizures over the past year but has not seen her neurologist for same.  Patient denied any precipitating factors today she states that she was sitting on her couch when her daughter witnessed her have a tonic-clonic type seizure.  Patient was slightly postictal on my initial assessment but answering all questions appropriately.  She was neurologically intact at the time.  She was diagnosed with COVID and released from the hospital approximately 10 days ago.  Patient has had some ongoing cough but no fevers or chills, URI symptoms, abdominal complaints.  Exam is reassuring.  Work-up was reassuring at this time.  I recommended follow-up with neurology to adjust the medications to better control of her seizure disorder.  Return precautions discussed with the patient.. Patient is given ED precautions to return to the ED for any worsening or new symptoms.     ____________________________________________  FINAL CLINICAL IMPRESSION(S) / ED DIAGNOSES  Final diagnoses:  Seizure (HCC)      NEW MEDICATIONS STARTED DURING THIS VISIT:  ED Discharge Orders    None          This chart was dictated using voice recognition software/Dragon. Despite best efforts to proofread, errors can occur which can change the meaning. Any change was purely unintentional.    Racheal Patches, PA-C 09/24/20 1523    Artis Delay  E, MD 09/24/20 1711

## 2020-09-24 NOTE — ED Notes (Signed)
E-signature not working at this time. Pt verbalized understanding of D/C instructions, prescriptions and follow up care with no further questions at this time. Pt in NAD and ambulatory at time of D/C.  

## 2020-09-25 ENCOUNTER — Other Ambulatory Visit: Payer: Self-pay | Admitting: *Deleted

## 2020-09-25 NOTE — Patient Outreach (Signed)
[facility]) Care Management  09/25/2020  Amanda [name] Jan 22, 1966 [id number]  THNoutreach to complex care patient post hospital stay/ED Amanda [name] was referred to [facility] on 07/04/20 by MD Referral Reason:Per notes on 07/04/20 patient reported she was unable to afford her medications most of the time Expensive medications identified are victoza, Jardinace, Breo,Latuda and Linzess Recurrent syncopal events related to medication 07/10/20 [facility] CMA confirmed on APL She is very familiar with her medications and the purposes for taking each Insurance:A[employer]na Medicare and Medicaid [place] access Last admission/ED 09/12/20-09/14/20 acute respiratory failure with hypoxia, pneumonia due to covid 19 virus, dehydration  07/19/20 ED for syncope, loss of consciousness, hypotension 07/03/20- 07/04/20 for recurrent syncope, depression, HTN, CHF   Amanda [name] returned a call to [facility] RN CM  Patient is able to verify HIPAA [facility] Portability and Accountability Act) identifiers Reviewed and addressed the purpose of the follow up call with the patient  Consent: [facility] ([facility] Customer service manager) RN CM reviewed [facility] services with patient. Patient gave verbal consent for services.  Follow up Amanda [name] and her daughter are walking their new dog, husky She confirms she had a 09/24/20 ED after having a seizure while her daughter was doing her hair. During the ED visit it was noted her blood pressure (BP) was also low She reports she was referred to see a new neurologist at [facility]  She has previously been followed by a neurologist but released   Her Cardiology is at [facility] and she is scheduled to follow up on 10/29/20 She reports her heart monitor she recently wore indicate tachycardia. She confirms she is able to feel when she is having tachycardia Her scheduled 09/26/20 teeth extraction has been cancelled as she has to have cardiac and neurology clearance first She is scheduled to  see Dr Letta Pate, primary care provider (PCP) on 09/27/20  See Dr Allegra Lai gastroenterologist is to be seen on 10/02/20 related to the cyst on her liver  She continues home health Physical Therapy (PT) and nurse services Her daughter is always with her   Plan: [facility] RN CM scheduled this patient for another outreach within 7-14 business days Pt encouraged to return a call to [facility] RN CM prn Goals Addressed              This Visit's Progress     Patient Stated   .  [facility]) Find Help in My Community (pt-stated)   On track     Follow Up Date 10/05/20   - begin a notebook of services in my neighborhood or community - follow-up on any referrals for help I am given - have a back-up plan     Notes:  08/01/20 Had [facility] CMA send pt scales and advance directive forms    .  [facility]) Manage My Medicine (pt-stated)   On track     Follow Up Date 10/05/20   - keep a list of all the medicines I take; vitamins and herbals too     Notes:     .  Track and Manage My Blood Pressure (pt-stated)   On track     Follow Up Date 10/05/20   - check blood pressure daily - choose a place to take my blood pressure (home, clinic or office, retail store)      Notes:          Kimberly L. Noelle Penner, RN, BSN, CCM [facility] Telephonic Care Management Care Coordinator Office number [phone] Mobile number [phone]  [place] number [phone] Fax number [phone]

## 2020-09-25 NOTE — Patient Outreach (Signed)
[facility]) Care Management  09/25/2020  Amanda [name] 1966-05-28 [id number]   THNunsuccessful outreach to complex care patient post hospital stay Amanda [name] was referred to [facility] on 07/04/20 by MD Referral Reason:Per notes on 07/04/20 patient reported she was unable to afford her medications most of the time Expensive medications identified are victoza, Jardinace, Breo,Latuda and Linzess Recurrent syncopal events related to medication 07/10/20 [facility] CMA confirmed on APL  Insurance:A[employer]na Medicare and Medicaid [place] access Last admission/ED 09/12/20-09/14/20 acute respiratory failure with hypoxia, pneumonia due to covid 19 virus, dehydration  07/19/20 ED for syncope, loss of consciousness, hypotension 07/03/20- 07/04/20 for recurrent syncope, depression, HTN, CHF   last successful outreach was on 09/14/20 1/13/22Pt confirms shehad just been discharged around time of outreach, had not made it home [facility] RN CM and Pt agreed to reschedule outreach   [facility] Unsuccessful outreach Outreach attempt to the home number  No answer. [facility] RN CM left HIPAA [facility] Portability and Accountability Act) compliant voicemail message along with CM's contact info.   Plan: [facility] RN CM scheduled this patient for another call attempt within 4-7 business days  Amanda Heuer L. Noelle Penner, RN, BSN, CCM [facility] Telephonic Care Management Care Coordinator Office number [phone] Mobile number [phone]  Main [facility] number [phone] Fax number [phone]

## 2020-09-26 DIAGNOSIS — E1122 Type 2 diabetes mellitus with diabetic chronic kidney disease: Secondary | ICD-10-CM | POA: Diagnosis not present

## 2020-09-26 DIAGNOSIS — I890 Lymphedema, not elsewhere classified: Secondary | ICD-10-CM | POA: Diagnosis not present

## 2020-09-26 DIAGNOSIS — J440 Chronic obstructive pulmonary disease with acute lower respiratory infection: Secondary | ICD-10-CM | POA: Diagnosis not present

## 2020-09-26 DIAGNOSIS — N189 Chronic kidney disease, unspecified: Secondary | ICD-10-CM | POA: Diagnosis not present

## 2020-09-26 DIAGNOSIS — J1282 Pneumonia due to coronavirus disease 2019: Secondary | ICD-10-CM | POA: Diagnosis not present

## 2020-09-26 DIAGNOSIS — J9691 Respiratory failure, unspecified with hypoxia: Secondary | ICD-10-CM | POA: Diagnosis not present

## 2020-09-26 DIAGNOSIS — I5032 Chronic diastolic (congestive) heart failure: Secondary | ICD-10-CM | POA: Diagnosis not present

## 2020-09-26 DIAGNOSIS — I951 Orthostatic hypotension: Secondary | ICD-10-CM | POA: Diagnosis not present

## 2020-09-26 DIAGNOSIS — U071 COVID-19: Secondary | ICD-10-CM | POA: Diagnosis not present

## 2020-09-26 DIAGNOSIS — I130 Hypertensive heart and chronic kidney disease with heart failure and stage 1 through stage 4 chronic kidney disease, or unspecified chronic kidney disease: Secondary | ICD-10-CM | POA: Diagnosis not present

## 2020-09-27 DIAGNOSIS — R5381 Other malaise: Secondary | ICD-10-CM | POA: Diagnosis not present

## 2020-09-27 DIAGNOSIS — U071 COVID-19: Secondary | ICD-10-CM | POA: Diagnosis not present

## 2020-09-27 DIAGNOSIS — R569 Unspecified convulsions: Secondary | ICD-10-CM | POA: Diagnosis not present

## 2020-10-02 ENCOUNTER — Other Ambulatory Visit: Payer: Self-pay

## 2020-10-02 ENCOUNTER — Encounter: Payer: Self-pay | Admitting: Gastroenterology

## 2020-10-02 ENCOUNTER — Ambulatory Visit (INDEPENDENT_AMBULATORY_CARE_PROVIDER_SITE_OTHER): Payer: Medicare HMO | Admitting: Gastroenterology

## 2020-10-02 VITALS — BP 138/85 | HR 137 | Temp 98.7°F | Ht 65.0 in | Wt 214.1 lb

## 2020-10-02 DIAGNOSIS — K760 Fatty (change of) liver, not elsewhere classified: Secondary | ICD-10-CM

## 2020-10-02 DIAGNOSIS — K602 Anal fissure, unspecified: Secondary | ICD-10-CM | POA: Diagnosis not present

## 2020-10-02 DIAGNOSIS — I130 Hypertensive heart and chronic kidney disease with heart failure and stage 1 through stage 4 chronic kidney disease, or unspecified chronic kidney disease: Secondary | ICD-10-CM | POA: Diagnosis not present

## 2020-10-02 DIAGNOSIS — J9691 Respiratory failure, unspecified with hypoxia: Secondary | ICD-10-CM | POA: Diagnosis not present

## 2020-10-02 DIAGNOSIS — J440 Chronic obstructive pulmonary disease with acute lower respiratory infection: Secondary | ICD-10-CM | POA: Diagnosis not present

## 2020-10-02 DIAGNOSIS — J1282 Pneumonia due to coronavirus disease 2019: Secondary | ICD-10-CM | POA: Diagnosis not present

## 2020-10-02 DIAGNOSIS — K7689 Other specified diseases of liver: Secondary | ICD-10-CM | POA: Diagnosis not present

## 2020-10-02 DIAGNOSIS — K625 Hemorrhage of anus and rectum: Secondary | ICD-10-CM | POA: Diagnosis not present

## 2020-10-02 DIAGNOSIS — I951 Orthostatic hypotension: Secondary | ICD-10-CM | POA: Diagnosis not present

## 2020-10-02 DIAGNOSIS — K648 Other hemorrhoids: Secondary | ICD-10-CM | POA: Diagnosis not present

## 2020-10-02 DIAGNOSIS — N189 Chronic kidney disease, unspecified: Secondary | ICD-10-CM | POA: Diagnosis not present

## 2020-10-02 DIAGNOSIS — U071 COVID-19: Secondary | ICD-10-CM | POA: Diagnosis not present

## 2020-10-02 DIAGNOSIS — E1122 Type 2 diabetes mellitus with diabetic chronic kidney disease: Secondary | ICD-10-CM | POA: Diagnosis not present

## 2020-10-02 DIAGNOSIS — K5904 Chronic idiopathic constipation: Secondary | ICD-10-CM

## 2020-10-02 DIAGNOSIS — I890 Lymphedema, not elsewhere classified: Secondary | ICD-10-CM | POA: Diagnosis not present

## 2020-10-02 DIAGNOSIS — I5032 Chronic diastolic (congestive) heart failure: Secondary | ICD-10-CM | POA: Diagnosis not present

## 2020-10-02 NOTE — Progress Notes (Signed)
Arlyss Repress, MD [address]  [address]  [place], [place] [address]  Main: [phone]  Fax: [phone]    Gastroenterology Consultation  Referring Provider:     Emogene Morgan, MD Primary Care Physician:  Emogene Morgan, MD Primary Gastroenterologist:  Dr. Arlyss Repress Reason for Consultation: Elevated LFTs, fatty liver, rectal bleeding        HPI:   Amanda [name] is a 55 y.o. female referred by Dr. Emogene Morgan, MD  for consultation & management of elevated LFTs, fatty liver.  Patient has history of metabolic syndrome, poorly controlled diabetes who is referred due to elevated LFTs.  She has history of cholecystectomy in 04/2017.  Labs from care everywhere at [facility] revealed elevated transaminases AST 78, ALT 97 in 04/2019.  Last normal in 11/2018.  She had repeat LFTs acute on 07/05/2019 which revealed mildly elevated alkaline phosphatase 111, AST and ALT were normal.  She underwent right upper quadrant ultrasound in 09/2018 which revealed diffuse hepatic steatosis, also confirmed on the CT abdomen pelvis without contrast in 10/2018.  Patient reports that she has been gaining weight.  She stays at home, currently on disability, tends to snack frequently, particularly in the evening.  She used to work as an Airline pilot  She does not smoke or drink alcohol.  Denies IV drug abuse, had tattoos in the past.  Denies blood transfusion in the past.  She did not work in [employer] She denies family history of liver disease Denies use of herbal supplements, heavy NSAID use  She does have intermittent rectal bleeding, associated with constipation.  She underwent ligation of the right posterior hemorrhoid in 10/2018  Follow-up visit 10/02/2020 Patient has not seen me for 2 years.  She is here to discuss about hemorrhoid ligation due to intermittent episodes of painless rectal bleeding, approximately once a week, blood dripping into the toilet bowel.  She reports that her constipation is fairly  regulated on Linzess 290 MCG daily.  She denies any straining, however her stools are hard and lumpy.  She does report having bowel movement every day.  She is concerned about abdominal bloating.  Her diabetes is improving, hemoglobin A1c has improved from 11.7 in 12/28 to 7.9 in 1/22.  Her most recent LFTs revealed mildly elevated ALT.  She had a CT chest PE protocol which revealed possible liver cyst as well as questionable nodular contour of the liver  NSAIDs: None  Antiplts/Anticoagulants/Anti thrombotics: None  GI Procedures: Colonoscopy 10/06/2018 - Preparation of the colon was fair. - Hemorrhoids found on perianal exam. - Stool in the entire examined colon. - Non-bleeding external and internal hemorrhoids, source of rectal bleeding - No specimens collected.  Colonoscopy by Dr. Mechele Collin in 03/2017 One diminutive polyp in the sigmoid colon, removed with a jumbo cold forceps. Resected and retrieved. - Internal hemorrhoids. - The examination was otherwise normal.  EGD by Dr. Mechele Collin 03/2017 - Normal esophagus. - Gastritis. Biopsied. - Normal examined duodenum.  DIAGNOSIS:  A. STOMACH, ANTRUM; COLD BIOPSY:  - MODERATE CHRONIC ACTIVE GASTRITIS, [employer].  - NEGATIVE FOR DYSPLASIA AND MALIGNANCY.   B. COLON POLYP, SIGMOID; COLD BIOPSY:  - HYPERPLASTIC EPITHELIAL CHANGE.  - NEGATIVE FOR DYSPLASIA AND MALIGNANCY.    Past Medical History:  Diagnosis Date  . ADD (attention deficit disorder)   . Anxiety   . Arthritis    knees, shoulder, upper back  . Asthma   . CFS (chronic fatigue syndrome)   . Chewing  difficulty   . CHF (congestive heart failure) (HCC)   . Chronic kidney disease   . Constipation   . Depression   . Depression   . Diabetes mellitus without complication (HCC)   . Dyspnea   . Environmental allergies   . Fatty liver   . Fibromyalgia   . GERD (gastroesophageal reflux disease)   . GI bleed 10/02/2018  . Headache    migraines - 5x/mo   . Hypertension   . Hypokalemia 10/14/2017  . Hypothyroidism   . IBS (irritable bowel syndrome)   . Joint pain   . Lower extremity edema   . Major depressive disorder, single episode   . Migraine without aura and without status migrainosus, not intractable   . Migraines   . Motion sickness    ships  . Osteoarthritis   . Other specified disorders of thyroid   . Sleep apnea   . Stroke [facility])    no residual deficits  . Swallowing difficulty   . Thyroid nodule    bilateral and goiter  . Vitamin D deficiency   . Wears contact lenses   . Wears dentures    partial upper    Past Surgical History:  Procedure Laterality Date  . ABDOMINAL HYSTERECTOMY  2000's  . ABDOMINAL HYSTERECTOMY    . ABDOMINAL SURGERY     laparoscopy x4 with lysis of adhesions  . APPENDECTOMY  1986  . CESAREAN SECTION  1992  . CHOLECYSTECTOMY N/A 04/03/2017   Procedure: LAPAROSCOPIC CHOLECYSTECTOMY;  Surgeon: Henrene Dodge, MD;  Location: [facility] ORS;  Service: General;  Laterality: N/A;  . COLONOSCOPY N/A 10/06/2018   Procedure: COLONOSCOPY;  Surgeon: Toney Reil, MD;  Location: [facility] ENDOSCOPY;  Service: Gastroenterology;  Laterality: N/A;  . COLONOSCOPY WITH PROPOFOL N/A 03/10/2017   Procedure: COLONOSCOPY WITH PROPOFOL;  Surgeon: Scot Jun, MD;  Location: [facility] ENDOSCOPY;  Service: Endoscopy;  Laterality: N/A;  . ECTOPIC PREGNANCY SURGERY  2000's  . ESOPHAGOGASTRODUODENOSCOPY (EGD) WITH PROPOFOL N/A 03/10/2017   Procedure: ESOPHAGOGASTRODUODENOSCOPY (EGD) WITH PROPOFOL;  Surgeon: Scot Jun, MD;  Location: [facility] ENDOSCOPY;  Service: Endoscopy;  Laterality: N/A;  . HERNIA REPAIR    . JOINT REPLACEMENT Right 12/16/12   knee- medial - makoplasty  . KNEE ARTHROSCOPY Right 2006   partial medial and lateral meniscectomies  . KNEE ARTHROSCOPY Right 10/06/2015   Procedure: RIGHT KNEE ARTHROSCOPY WITH DEBRIDEMENT;  Surgeon: Erin Sons, MD;  Location: [facility];  Service: Orthopedics;   Laterality: Right;  Diabetic - insulin and oral meds  . ROTATOR CUFF REPAIR Left 2006  . TUBAL LIGATION  2000's    Current Outpatient Medications:  .  Ascorbic Acid (VITAMIN C CR) 1000 MG TBCR, Take 1 tablet by mouth daily., Disp: , Rfl:  .  B-D UF III MINI PEN NEEDLES 31G X 5 MM MISC, Inject into the skin daily., Disp: , Rfl:  .  BD PEN NEEDLE MICRO U/F 32G X 6 MM MISC, Inject into the skin., Disp: , Rfl:  .  cariprazine (VRAYLAR) capsule, Take 3 mg by mouth daily., Disp: , Rfl:  .  cholecalciferol (VITAMIN D) 25 MCG (1000 UNIT) tablet, Take 1,000 Units by mouth daily., Disp: , Rfl:  .  dexmethylphenidate (FOCALIN XR) 15 MG 24 hr capsule, Take 1 capsule by mouth daily., Disp: , Rfl:  .  DULoxetine (CYMBALTA) 30 MG capsule, Take 30 mg by mouth daily., Disp: , Rfl:  .  estradiol (ESTRACE) 0.1 MG/GM vaginal cream, , Disp: , Rfl:  .  ferrous sulfate 324 MG TBEC, Take 324 mg by mouth daily., Disp: , Rfl:  .  flunisolide (NASALIDE) 25 MCG/ACT (0.025%) SOLN, Place into the nose., Disp: , Rfl:  .  furosemide (LASIX) 20 MG tablet, Take 20 mg by mouth daily., Disp: , Rfl:  .  INPEN 100-GRAY-LILLY DEVI, as directed., Disp: , Rfl:  .  Insulin Pen Needle 31G X 6 MM MISC, SMARTSIG:1 Needle SUB-Q 4 Times Daily, Disp: , Rfl:  .  ipratropium-albuterol (DUONEB) 0.5-2.5 (3) MG/3ML SOLN, Inhale into the lungs., Disp: , Rfl:  .  JARDIANCE 25 MG TABS tablet, Take 25 mg by mouth daily., Disp: , Rfl:  .  lidocaine (LIDODERM) 5 %, lidocaine 5 % topical patch  PLEASE SEE ATTACHED FOR DETAILED DIRECTIONS, Disp: , Rfl:  .  LINZESS 290 MCG CAPS capsule, Take 290 mcg by mouth daily., Disp: , Rfl:  .  liraglutide (VICTOZA) 18 MG/3ML SOPN, Inject 0.5 mLs (3 mg total) into the skin daily., Disp: 5 pen, Rfl: 0 .  LORazepam (ATIVAN) 0.5 MG tablet, Take 0.5 mg by mouth 2 (two) times daily as needed., Disp: , Rfl:  .  losartan (COZAAR) 50 MG tablet, Hold until followup with outpatient doctor because your blood pressure was low  on presentation to the hospital., Disp: 30 tablet, Rfl: 2 .  mirtazapine (REMERON SOL-TAB) 30 MG disintegrating tablet, Take 30 mg by mouth at bedtime as needed (Nap). , Disp: , Rfl: 2 .  montelukast (SINGULAIR) 10 MG tablet, Take 10 mg by mouth daily., Disp: , Rfl:  .  NOVOLOG FLEXPEN 100 UNIT/ML FlexPen, Inject 10 Units into the skin daily., Disp: , Rfl:  .  omeprazole (PRILOSEC) 40 MG capsule, Take 40 mg by mouth daily., Disp: , Rfl:  .  potassium chloride SA (KLOR-CON) 20 MEQ tablet, Take 20 mEq by mouth daily., Disp: , Rfl:  .  pravastatin (PRAVACHOL) 40 MG tablet, Take 40 mg by mouth daily., Disp: , Rfl:  .  predniSONE (DELTASONE) 10 MG tablet, Take 50 mg daily taper by 10 mg daily then stop, Disp: 15 tablet, Rfl: 0 .  pregabalin (LYRICA) 150 MG capsule, Take 150 mg by mouth 3 (three) times daily., Disp: , Rfl:  .  PROAIR RESPICLICK 108 (90 Base) MCG/ACT AEPB, Inhale 2 puffs into the lungs every 4 (four) hours as needed., Disp: , Rfl:  .  tiZANidine (ZANAFLEX) 4 MG tablet, Take 4 mg by mouth every 8 (eight) hours as needed for muscle spasms. , Disp: , Rfl:  .  topiramate (TOPAMAX) 100 MG tablet, Take 100 mg by mouth 2 (two) times daily., Disp: , Rfl:  .  TRESIBA FLEXTOUCH 200 UNIT/ML FlexTouch Pen, 72 units per day seems very high for you.  Please follow up with your outpatient provider for further adjustment., Disp: , Rfl:  .  TRUEPLUS PEN NEEDLES 31G X 6 MM MISC, , Disp: , Rfl:  .  zolpidem (AMBIEN) 10 MG tablet, Take 10 mg by mouth at bedtime., Disp: , Rfl:    Family History  Problem Relation Age of Onset  . Hypertension Mother   . Diabetes Mother   . Heart disease Mother   . Depression Mother   . Obesity Mother   . Hypertension Father   . Diabetes Father   . Allergies Father   . Kidney disease Father   . Cancer Father   . Breast cancer Neg Hx      Social History   Tobacco Use  . Smoking status: Never  Smoker  . Smokeless tobacco: Never Used  Vaping Use  . Vaping Use:  Never used  Substance Use Topics  . Alcohol use: Not Currently  . Drug use: Never    Allergies as of 10/02/2020 - Review Complete 10/02/2020  Allergen Reaction Noted  . Penicillins Anaphylaxis 05/28/2015  . Penicillins Anaphylaxis 05/07/2020  . Ace inhibitors Swelling 05/28/2015  . Ace inhibitors Swelling 05/07/2020    Review of Systems:    All systems reviewed and negative except where noted in HPI.   Physical Exam:  BP 138/85 (BP Location: Left Arm, Patient Position: Sitting, Cuff Size: Normal)   Pulse (!) 137   Temp 98.7 F (37.1 C) (Oral)   Ht 5\' 5"  (1.651 m)   Wt 214 lb 2 oz (97.1 kg)   BMI 35.63 kg/m  No LMP recorded. Patient has had a hysterectomy.  General:   Alert,  Well-developed, well-nourished, pleasant and cooperative in NAD Head:  Normocephalic and atraumatic. Eyes:  Sclera clear, no icterus.   Conjunctiva pink. Ears:  Normal auditory acuity. Nose:  No deformity, discharge, or lesions. Mouth:  No deformity or lesions,oropharynx pink & moist. Neck:  Supple; no masses or thyromegaly. Lungs:  Respirations even and unlabored.  Clear throughout to auscultation.   No wheezes, crackles, or rhonchi. No acute distress. Heart:  Regular rate and rhythm; no murmurs, clicks, rubs, or gallops. Abdomen:  Normal bowel sounds. Soft, obese, mildly distended, tympanic to percussion, non-tender without masses, hepatosplenomegaly or hernias noted.  No guarding or rebound tenderness.   Rectal: Perianal skin tags, tender digital rectal exam, point tenderness in the posterior wall of the anal canal consistent with anal fissure Msk:  Symmetrical without gross deformities. Good, equal movement & strength bilaterally. Pulses:  Normal pulses noted. Extremities:  No clubbing or edema.  No cyanosis. Neurologic:  Alert and oriented x3;  grossly normal neurologically. Skin:  Intact without significant lesions or rashes. No jaundice. Psych:  Alert and cooperative. Normal mood and  affect.  Imaging Studies: Reviewed  Assessment and Plan:   Puneet Selden is a 55 y.o. female with metabolic syndrome, fatty liver, elevated LFTs, chronic constipation with rectal bleeding secondary to hemorrhoids, history of H. pylori infection in 2018  Elevated LFTs, most likely secondary to nonalcoholic fatty liver disease Acute viral hepatitis panel is negative Secondary liver disease work-up is negative Recommend tight control of diabetes, reiterated on healthy diet and exercise Recheck LFTs every 3 to 6 months  ?  Liver cyst Recommend right upper quadrant ultrasound  History of H. pylori gastritis H. pylori breath test in 08/2019 is negative   Chronic constipation Currently on Linzess 290 MCG daily Recommended her to avoid snacking Discussed about addition of MiraLAX once a day along with Linzess  Rectal bleeding, secondary to hemorrhoids S/p ligation of right posterior hemorrhoid in 12/20.  She has ongoing intermittent painless rectal bleeding Perform hemorrhoid ligation today  Rectal pain secondary to anal fissure Recommend 0.125% nitroglycerin with 5% lidocaine, instructions provided    Follow up in 2-3 weeks   1/21, MD

## 2020-10-02 NOTE — Progress Notes (Signed)
PROCEDURE NOTE: The patient presents with symptomatic grade 2 hemorrhoids, unresponsive to maximal medical therapy, requesting rubber band ligation of his/her hemorrhoidal disease.  All risks, benefits and alternative forms of therapy were described and informed consent was obtained.  The decision was made to band the RA internal hemorrhoid, and the [facility] System was used to perform band ligation without complication.  Digital anorectal examination was then performed to assure proper positioning of the band, and to adjust the banded tissue as required.  The patient was discharged home without significant pain or other issues.  Dietary and behavioral recommendations were given and (if necessary - prescriptions were given), along with follow-up instructions.  The patient will return 2 weeks for follow-up and possible additional banding as required.  No complications were encountered and the patient tolerated the procedure well.

## 2020-10-02 NOTE — Patient Instructions (Addendum)
Your RUQ ultrasound is scheduled for 10/05/2020 [place] [facility] medical mall at 9:45am. Nothing to eat or drink after midnight   [facility] [address] [place] [address]. Phone number [phone]. Please allow at least 24 hours before picking up the compounded cream because the pharmacy has to make the medication.

## 2020-10-03 ENCOUNTER — Ambulatory Visit: Payer: Medicare HMO | Admitting: Pulmonary Disease

## 2020-10-04 ENCOUNTER — Ambulatory Visit: Payer: Medicare Other | Admitting: Primary Care

## 2020-10-04 NOTE — Progress Notes (Deleted)
@Patient  ID: , female    DOB: 11-11-1965, 55 y.o.   MRN: [id number]  No chief complaint on file.   Referring provider: [id number], MD  HPI: 55 year old female, never smoked. PMH significant CHF, HTN, severe asthma/COPD, sleep apnea, covid pneumonia, type 2 diabetes, epilepsy, hyperlipidemia, obesity. Former patient of Dr. 53, last seen by pulmonary NP on 07/13/19. Poor compliance with CPAP. Maintained on BREO, Spiriva and Singulair.  10/04/2020 Patient presents today for hospital follow-up. She was admitted 09/12/20-09/14/20 for acute respiratory failure secondary to recent covid pneumonia. She was covid positive on 08/31/20. History of hypotension. SBP in the 50s on arrival by EMS. She had transient hypoxia which has now resolved.      Allergies  Allergen Reactions  . Penicillins Anaphylaxis    Has patient had a PCN reaction causing immediate rash, facial/tongue/throat swelling, SOB or lightheadedness with hypotension: Yes Has patient had a PCN reaction causing severe rash involving mucus membranes or skin necrosis: No Has patient had a PCN reaction that required hospitalization No Has patient had a PCN reaction occurring within the last 10 years: No If all of the above answers are "NO", then may proceed with Cephalosporin use.  Has patient had a PCN reaction causing immediate rash, facial/tongue/throat swelling, SOB or lightheadedness with hypotension: Yes Has patient had a PCN reaction causing severe rash involving mucus membranes or skin necrosis: No Has patient had a PCN reaction that required hospitalization No Has patient had a PCN reaction occurring within the last 10 years: No If all of the above answers are "NO", then may proceed with Cephalosporin use. Other reaction(s): Other (See Comments) Severe/almost died Other reaction(s): ANAPHYLAXIS Has patient had a PCN reaction causing immediate rash, facial/tongue/throat swelling, SOB or lightheadedness  with hypotension: Yes Has patient had a PCN reaction causing severe rash involving mucus membranes or skin necrosis: No Has patient had a PCN reaction that required hospitalization No Has patient had a PCN reaction occurring within the last 10 years: No If all of the above answers are "NO", then may proceed with Cephalosporin use. Other reaction(s): Other (See Comments) Severe/almost died Other reaction(s): ANAPHYLAXIS Has patient had a PCN reaction causing immediate rash, facial/tongue/throat swelling, SOB or lightheadedness with hypotension: Yes Has patient had a PCN reaction causing severe rash involving mucus membranes or skin necrosis: No Has patient had a PCN reaction that required hospitalization No Has patient had a PCN reaction occurring within the last 10 years: No If all of the above answers are "NO", then may proceed with Cephalospo... (TRUNCATED)  . Penicillins Anaphylaxis  . Ace Inhibitors Swelling  . Ace Inhibitors Swelling    Immunization History  Administered Date(s) Administered  . Hep A / Hep B 08/05/2019, 09/09/2019  . Influenza,inj,Quad PF,6+ Mos 06/05/2016, 06/05/2017, 06/16/2018, 08/27/2019, 07/04/2020  . Pneumococcal Polysaccharide-23 03/20/2016, 06/05/2017    Past Medical History:  Diagnosis Date  . ADD (attention deficit disorder)   . Anxiety   . Arthritis    knees, shoulder, upper back  . Asthma   . CFS (chronic fatigue syndrome)   . Chewing difficulty   . CHF (congestive heart failure) (HCC)   . Chronic kidney disease   . Constipation   . Depression   . Depression   . Diabetes mellitus without complication (HCC)   . Dyspnea   . Environmental allergies   . Fatty liver   . Fibromyalgia   . GERD (gastroesophageal reflux disease)   . GI bleed 10/02/2018  .  Headache    migraines - 5x/mo  . Hypertension   . Hypokalemia 10/14/2017  . Hypothyroidism   . IBS (irritable bowel syndrome)   . Joint pain   . Lower extremity edema   . Major depressive  disorder, single episode   . Migraine without aura and without status migrainosus, not intractable   . Migraines   . Motion sickness    ships  . Osteoarthritis   . Other specified disorders of thyroid   . Sleep apnea   . Stroke [facility])    no residual deficits  . Swallowing difficulty   . Thyroid nodule    bilateral and goiter  . Vitamin D deficiency   . Wears contact lenses   . Wears dentures    partial upper    Tobacco History: Social History   Tobacco Use  Smoking Status Never Smoker  Smokeless Tobacco Never Used   Counseling given: Not Answered   Outpatient Medications Prior to Visit  Medication Sig Dispense Refill  . Ascorbic Acid (VITAMIN C CR) 1000 MG TBCR Take 1 tablet by mouth daily.    . B-D UF III MINI PEN NEEDLES 31G X 5 MM MISC Inject into the skin daily.    . BD PEN NEEDLE MICRO U/F 32G X 6 MM MISC Inject into the skin.    . cariprazine (VRAYLAR) capsule Take 3 mg by mouth daily.    . cholecalciferol (VITAMIN D) 25 MCG (1000 UNIT) tablet Take 1,000 Units by mouth daily.    [name] Kitchen dexmethylphenidate (FOCALIN XR) 15 MG 24 hr capsule Take 1 capsule by mouth daily.    . DULoxetine (CYMBALTA) 30 MG capsule Take 30 mg by mouth daily.    [name] Kitchen estradiol (ESTRACE) 0.1 MG/GM vaginal cream     . ferrous sulfate 324 MG TBEC Take 324 mg by mouth daily.    . flunisolide (NASALIDE) 25 MCG/ACT (0.025%) SOLN Place into the nose.    . furosemide (LASIX) 20 MG tablet Take 20 mg by mouth daily.    . INPEN 100-GRAY-LILLY DEVI as directed.    . Insulin Pen Needle 31G X 6 MM MISC SMARTSIG:1 Needle SUB-Q 4 Times Daily    . ipratropium-albuterol (DUONEB) 0.5-2.5 (3) MG/3ML SOLN Inhale into the lungs.    [name] Kitchen JARDIANCE 25 MG TABS tablet Take 25 mg by mouth daily.    [name] Kitchen lidocaine (LIDODERM) 5 % lidocaine 5 % topical patch  PLEASE SEE ATTACHED FOR DETAILED DIRECTIONS    . LINZESS 290 MCG CAPS capsule Take 290 mcg by mouth daily.    [name] Kitchen liraglutide (VICTOZA) 18 MG/3ML SOPN Inject 0.5 mLs (3 mg  total) into the skin daily. 5 pen 0  . LORazepam (ATIVAN) 0.5 MG tablet Take 0.5 mg by mouth 2 (two) times daily as needed.    [name] Kitchen losartan (COZAAR) 50 MG tablet Hold until followup with outpatient doctor because your blood pressure was low on presentation to the hospital. 30 tablet 2  . mirtazapine (REMERON SOL-TAB) 30 MG disintegrating tablet Take 30 mg by mouth at bedtime as needed (Nap).   2  . montelukast (SINGULAIR) 10 MG tablet Take 10 mg by mouth daily.    [name] Kitchen NOVOLOG FLEXPEN 100 UNIT/ML FlexPen Inject 10 Units into the skin daily.    [name] Kitchen omeprazole (PRILOSEC) 40 MG capsule Take 40 mg by mouth daily.    . potassium chloride SA (KLOR-CON) 20 MEQ tablet Take 20 mEq by mouth daily.    . pravastatin (PRAVACHOL) 40 MG tablet Take 40  mg by mouth daily.    . predniSONE (DELTASONE) 10 MG tablet Take 50 mg daily taper by 10 mg daily then stop 15 tablet 0  . pregabalin (LYRICA) 150 MG capsule Take 150 mg by mouth 3 (three) times daily.    [name] Kitchen PROAIR RESPICLICK 108 (90 Base) MCG/ACT AEPB Inhale 2 puffs into the lungs every 4 (four) hours as needed.    [name] Kitchen tiZANidine (ZANAFLEX) 4 MG tablet Take 4 mg by mouth every 8 (eight) hours as needed for muscle spasms.     [name] Kitchen topiramate (TOPAMAX) 100 MG tablet Take 100 mg by mouth 2 (two) times daily.    Evaristo Bury FLEXTOUCH 200 UNIT/ML FlexTouch Pen 72 units per day seems very high for you.  Please follow up with your outpatient provider for further adjustment.    . TRUEPLUS PEN NEEDLES 31G X 6 MM MISC     . zolpidem (AMBIEN) 10 MG tablet Take 10 mg by mouth at bedtime.     No facility-administered medications prior to visit.      Review of Systems  Review of Systems   Physical Exam  There were no vitals taken for this visit. Physical Exam   Lab Results:  CBC    Component Value Date/Time   WBC 8.7 09/24/2020 1049   RBC 4.55 09/24/2020 1049   HGB 13.0 09/24/2020 1049   HGB 13.6 10/19/2019 1310   HCT 40.7 09/24/2020 1049   HCT 42.1 10/19/2019 1310    PLT 265 09/24/2020 1049   PLT 359 10/19/2019 1310   MCV 89.5 09/24/2020 1049   MCV 92 10/19/2019 1310   MCV 91 11/12/2011 1627   MCH 28.6 09/24/2020 1049   MCHC 31.9 09/24/2020 1049   RDW 15.2 09/24/2020 1049   RDW 13.3 10/19/2019 1310   RDW 14.1 11/12/2011 1627   LYMPHSABS 2.1 09/12/2020 1923   LYMPHSABS 1.9 10/19/2019 1310   MONOABS 0.6 09/12/2020 1923   EOSABS 0.0 09/12/2020 1923   EOSABS 0.0 10/19/2019 1310   BASOSABS 0.0 09/12/2020 1923   BASOSABS 0.0 10/19/2019 1310    BMET    Component Value Date/Time   NA 137 09/24/2020 1049   NA 142 10/19/2019 1310   NA 137 11/12/2011 1627   K 3.4 (L) 09/24/2020 1049   K 4.9 11/12/2011 1627   CL 99 09/24/2020 1049   CL 101 11/12/2011 1627   CO2 25 09/24/2020 1049   CO2 26 11/12/2011 1627   GLUCOSE 150 (H) 09/24/2020 1049   GLUCOSE 151 (H) 11/12/2011 1627   BUN 14 09/24/2020 1049   BUN 13 10/19/2019 1310   BUN 14 11/12/2011 1627   CREATININE 0.86 09/24/2020 1049   CREATININE 0.71 11/12/2011 1627   CALCIUM 9.8 09/24/2020 1049   CALCIUM 8.5 11/12/2011 1627   GFRNONAA >60 09/24/2020 1049   GFRNONAA >60 11/12/2011 1627   GFRAA >60 05/09/2020 0535   GFRAA >60 11/12/2011 1627    BNP    Component Value Date/Time   BNP 64.0 09/12/2020 2341    ProBNP No results found for: PROBNP  Imaging: DG Chest 2 View  Result Date: 09/24/2020 CLINICAL DATA:  55 year old female with history of COVID pneumonia with persistent cough. Seizure. EXAM: CHEST - 2 VIEW COMPARISON:  Chest x-ray 09/12/2020. FINDINGS: Lung volumes are low. No consolidative airspace disease. No pleural effusions. No pneumothorax. No evidence of pulmonary edema. Heart size is mildly enlarged. The patient is rotated to the right on today's exam, resulting in distortion of the mediastinal contours  and reduced diagnostic sensitivity and specificity for mediastinal pathology. IMPRESSION: 1. Low lung volumes without radiographic evidence of acute cardiopulmonary disease. 2.  Mild cardiomegaly. Electronically Signed   By: Trudie Reed M.D.   On: 09/24/2020 13:55   CT Angio Chest PE W and/or Wo Contrast  Result Date: 09/12/2020 CLINICAL DATA:  PE suspected, lethargy weakness and hypotension, history of COVID infection diagnosed on December 30th. EXAM: CT ANGIOGRAPHY CHEST WITH CONTRAST TECHNIQUE: Multidetector CT imaging of the chest was performed using the standard protocol during bolus administration of intravenous contrast. Multiplanar CT image reconstructions and MIPs were obtained to evaluate the vascular anatomy. CONTRAST:  OMNIPAQUE IOHEXOL 350 MG/ML SOLN COMPARISON:  September of 2021 FINDINGS: Cardiovascular: Normal caliber thoracic aorta. Heart size mildly enlarged with signs of pericardial fluid similar to the previous study from September of 2021. Central pulmonary vasculature of normal caliber. Lung base assessment is limited by respiratory motion the subsegmental level. No central, lobar or segmental level embolus. Mediastinum/Nodes: Patulous esophagus. No adenopathy in the thoracic inlet. No mediastinal lymphadenopathy no thoracic inlet adenopathy no hilar adenopathy. Lungs/Pleura: Areas of ground-glass attenuation at the lung bases. Linear atelectatic changes. No dense consolidation. No sign of pleural effusion. Upper Abdomen: Incidental imaging of upper abdominal contents shows low-attenuation lesions in the liver that are stable since the most recent comparison evaluation but are not definitely seen on the study of October 05, 2018. There is hepatic steatosis and liver contours or more lobular. Largest 1 measures water density on image 180 of series 5 and is stable at approximately 1.5 cm. Musculoskeletal: Spinal degenerative changes. No acute or destructive bone process. Review of the MIP images confirms the above findings. IMPRESSION: 1. Areas of ground-glass attenuation at the lung bases, may represent atelectasis or mild pneumonitis. 2. No evidence of  pulmonary embolus to the lobar or segmental level. 3. Mild cardiomegaly with signs of pericardial fluid similar to the previous study from September of 2021. 4. Potential hepatic cysts but not seen in February of 2020. Profound hepatic steatosis at that time may not have allowed for detection. Suggest follow-up liver ultrasound as an initial means of further evaluating these findings as there may be underlying liver disease in this patient and this area is not well assessed. 5. Aortic atherosclerosis. Aortic Atherosclerosis (ICD10-I70.0). Electronically Signed   By: Donzetta Kohut M.D.   On: 09/12/2020 21:15   DG Chest Portable 1 View  Result Date: 09/12/2020 CLINICAL DATA:  Shortness of breath, COVID EXAM: PORTABLE CHEST 1 VIEW COMPARISON:  07/04/2020 FINDINGS: Very low lung volumes. Mild cardiomegaly. Bibasilar atelectasis. No effusions or acute bony abnormality. IMPRESSION: Low lung volumes, bibasilar atelectasis. Electronically Signed   By: Charlett Nose M.D.   On: 09/12/2020 20:05     Assessment & Plan:   No problem-specific Assessment & Plan notes found for this encounter.     Glenford Bayley, NP 10/04/2020

## 2020-10-05 ENCOUNTER — Other Ambulatory Visit: Payer: Self-pay | Admitting: *Deleted

## 2020-10-05 ENCOUNTER — Ambulatory Visit: Payer: Medicare Other | Attending: Gastroenterology

## 2020-10-05 NOTE — Patient Outreach (Signed)
[facility]) Care Management  10/05/2020  Hamdi Vari [birth year] [id number]   [facility] Unsuccessful outreach  Mrs Cylah Fannin was referred to [facility] on 07/04/20 by MD Referral Reason:Per notes on 07/04/20 patient reported she was unable to afford her medications most of the time Expensive medications identified are victoza, Jardinace, Breo,Latuda and Linzess Recurrent syncopal events related to medication 07/10/20 [facility] CMA confirmed on APL She is very familiar with her medications and the purposes for taking each Insurance:[employer] Medicare and Medicaid [place] access Last admission/ED 09/12/20-09/14/20 acute respiratory failure with hypoxia, pneumonia due to covid 19 virus, dehydration  07/19/20 ED for syncope, loss of consciousness, hypotension 07/03/20- 07/04/20 for recurrent syncope, depression, HTN, CHF  Last [facility] RN CM outreach- with pt pending pcp visit to get further referrals to specialists   Review of Epic indicates a referral from her primary care provider (PCP) office to Theora Master, neurology on 09/27/20. Pt to see neurology on 10/18/20 and 11/15/20 10/02/20 visit with Dr Allegra Lai Gastroenterologist for band of the RA internal hemorrhoid with the [facility]. To follow up on 10/16/20 Pt was seen by pulmonology on 10/04/20 Ames Dura NP Pt scheduled for a right upper quadrant ultrasound on 10/05/20  Pt to see endocrinology, Meyer Cory on 11/13/20 [facility] Pt to see nephrology Shelbie Hutching on 02/12/21 [facility]   Outreach attempt to the home number  No answer. [facility] RN CM left HIPAA [facility] Portability and Accountability Act) compliant voicemail message along with CM's contact info.   Plan: [facility] RN CM scheduled this patient for another call attempt within 4-7 business days  Brittnei Jagiello L. Noelle Penner, RN, BSN, CCM [facility] Telephonic Care Management Care Coordinator Office number [phone] Mobile number [phone]  Main [facility] number [phone] Fax number  [phone]

## 2020-10-10 ENCOUNTER — Ambulatory Visit: Payer: Medicare Other | Admitting: Primary Care

## 2020-10-16 ENCOUNTER — Ambulatory Visit: Payer: Medicare HMO | Admitting: Gastroenterology

## 2020-10-16 ENCOUNTER — Ambulatory Visit: Payer: Medicare Other | Admitting: Primary Care

## 2020-10-16 ENCOUNTER — Other Ambulatory Visit: Payer: Self-pay | Admitting: *Deleted

## 2020-10-16 ENCOUNTER — Other Ambulatory Visit: Payer: Self-pay

## 2020-10-16 ENCOUNTER — Encounter: Payer: Self-pay | Admitting: *Deleted

## 2020-10-16 NOTE — Patient Outreach (Signed)
[facility]) Care Management  10/16/2020  Amanda [name] 02-08-1966 [id number]  [facility] outreach to complex care patient  [name] was referred to [facility] on 07/04/20 by MD Referral Reason:Per notes on 07/04/20 patient reported she was unable to afford her medications most of the time Expensive medications identified are victoza, Jardinace, Breo,Latuda and Linzess Recurrent syncopal events related to medication 07/10/20 [facility] CMA confirmed on APL  Insurance:[employer] Medicare and Medicaid [place] access Last admission/ED 09/12/20-09/14/20 acute respiratory failure with hypoxia, pneumonia due to covid 19 virus, dehydration  07/19/20 ED for syncope, loss of consciousness, hypotension 07/03/20- 07/04/20 for recurrent syncope, depression, HTN, CHF    Patient is able to verify HIPAA ([place]) identifiers Reviewed and addressed the purpose of the follow up call with the patient  Consent: [facility] RN CM reviewed [facility] services with patient. Patient gave verbal consent for services.  Follow up assessment Pt reports she is out with family - errand Pain at site 7-8 at recent hemorrhoidal site  Still having hard stool, constipation Constipation prevention discussed, encouraged prevention of dehydration, use of stool softener and fiber     Seizure reports no further seizure  Saw pcp on 10/11/20   Denies medication issues   COPD Always shortness of breath (sob) not using nebulizer as it is reported broken (55 years old), has used inhaler To see pulmonologist on next week   congestive Heart Failure (CHF) No swelling, some sob but not sure if related to COPD  Hypertension (HTN) BP values at home are now good   Diabetes (DM) type 2  CBG good 100's-180 denies hypoglycemia episodes   Denies GI issues    Neurologist to be seen on  10/19/20  Plans  [facility] RN CM will follow up with patient within the next 30  business days Goals Addressed              This Visit's Progress     Patient Stated   .  [facility]) Find Help in My Community (pt-stated)   On track     Follow Up Date 10/16/20   - begin a notebook of services in my neighborhood or community - follow-up on any referrals for help I am given - have a back-up plan    Notes:  10/16/20 scales received  08/01/20 Had [facility] CMA send pt scales and advance directive forms    .  COMPLETED: [facility]) Manage My Medicine (pt-stated)   On track     Follow Up Date 10/16/20   - keep a list of all the medicines I take; vitamins and herbals too      Notes: 10/16/20 goal met    .  COMPLETED: Track and Manage My Blood Pressure (pt-stated)   On track     Follow Up Date 10/16/20   - check blood pressure daily - choose a place to take my blood pressure (home, clinic or office, retail store)      Notes: 10/16/20 reports BP improved WNL goal met        Amanda Millin L. Lavina Hamman, RN, BSN, Radar [employer] Coordinator Office number [phone] Main [facility] number [phone] Fax number [phone]

## 2020-10-18 DIAGNOSIS — R569 Unspecified convulsions: Secondary | ICD-10-CM | POA: Insufficient documentation

## 2020-10-20 ENCOUNTER — Ambulatory Visit: Payer: Medicare Other | Admitting: Primary Care

## 2020-10-20 NOTE — Progress Notes (Deleted)
@Patient  ID: , female    DOB: [birth year], 55 y.o.   MRN: [id number]  No chief complaint on file.   Referring provider: [id number], MD  HPI: 55 year old female, never smoked. PMH significant for chronic diastolic heart failure, hypotension, asthma/COPD, pneumonia d/t pneumonia, sleep apnea, GERD. H.Pylori, type 2 diabetes, epilepsy, hyperlipidemia, depression. Former patient of Dr. 53, last seen by pulmonary NP on 07/13/19. Maintained on Breo, Spiriva and Singulair. Restarted CPAP in November 2020.   10/20/2020 - Interim hx  Patient presents today for covid follow-up. She was admitted 09/12/20-09/14/20 for acute respiratory failure with hypoxia secondary to covid pneumonia and hypotension. She was diagnosed with Covid on 08/31/20, symptoms progressively worsened including weakness, cough, fatigue. EMS was called and patients O2 was 80% and SBP 50s. She was beyond window for antivirals. Treated with oral steriods. Received 3L today IVF. CTA negative for PE; showed ground glass lung bases and mild cardiomegaly.     Pulmonary function testing 05/23/16- FVC 2.24 (75%), FEV1 2.07 (86%), ratio 92, TLC 55%, DLCOunc 22.02 (85%)  Allergies  Allergen Reactions  . Penicillins Anaphylaxis    Has patient had a PCN reaction causing immediate rash, facial/tongue/throat swelling, SOB or lightheadedness with hypotension: Yes Has patient had a PCN reaction causing severe rash involving mucus membranes or skin necrosis: No Has patient had a PCN reaction that required hospitalization No Has patient had a PCN reaction occurring within the last 10 years: No If all of the above answers are "NO", then may proceed with Cephalosporin use.  Has patient had a PCN reaction causing immediate rash, facial/tongue/throat swelling, SOB or lightheadedness with hypotension: Yes Has patient had a PCN reaction causing severe rash involving mucus membranes or skin necrosis: No Has patient had a PCN  reaction that required hospitalization No Has patient had a PCN reaction occurring within the last 10 years: No If all of the above answers are "NO", then may proceed with Cephalosporin use. Other reaction(s): Other (See Comments) Severe/almost died Other reaction(s): ANAPHYLAXIS Has patient had a PCN reaction causing immediate rash, facial/tongue/throat swelling, SOB or lightheadedness with hypotension: Yes Has patient had a PCN reaction causing severe rash involving mucus membranes or skin necrosis: No Has patient had a PCN reaction that required hospitalization No Has patient had a PCN reaction occurring within the last 10 years: No If all of the above answers are "NO", then may proceed with Cephalosporin use. Other reaction(s): Other (See Comments) Severe/almost died Other reaction(s): ANAPHYLAXIS Has patient had a PCN reaction causing immediate rash, facial/tongue/throat swelling, SOB or lightheadedness with hypotension: Yes Has patient had a PCN reaction causing severe rash involving mucus membranes or skin necrosis: No Has patient had a PCN reaction that required hospitalization No Has patient had a PCN reaction occurring within the last 10 years: No If all of the above answers are "NO", then may proceed with Cephalospo... (TRUNCATED)  . Penicillins Anaphylaxis  . Ace Inhibitors Swelling  . Ace Inhibitors Swelling    Immunization History  Administered Date(s) Administered  . Hep A / Hep B 08/05/2019, 09/09/2019  . Influenza,inj,Quad PF,6+ Mos 06/05/2016, 06/05/2017, 06/16/2018, 08/27/2019, 07/04/2020  . Pneumococcal Polysaccharide-23 03/20/2016, 06/05/2017    Past Medical History:  Diagnosis Date  . ADD (attention deficit disorder)   . Anxiety   . Arthritis    knees, shoulder, upper back  . Asthma   . CFS (chronic fatigue syndrome)   . Chewing difficulty   . CHF (congestive heart failure) (HCC)   .  Chronic kidney disease   . Constipation   . Depression   .  Depression   . Diabetes mellitus without complication (HCC)   . Dyspnea   . Environmental allergies   . Fatty liver   . Fibromyalgia   . GERD (gastroesophageal reflux disease)   . GI bleed 10/02/2018  . Headache    migraines - 5x/mo  . Hypertension   . Hypokalemia 10/14/2017  . Hypothyroidism   . IBS (irritable bowel syndrome)   . Joint pain   . Lower extremity edema   . Major depressive disorder, single episode   . Migraine without aura and without status migrainosus, not intractable   . Migraines   . Motion sickness    ships  . Osteoarthritis   . Other specified disorders of thyroid   . Sleep apnea   . Stroke [facility](HCC)    no residual deficits  . Swallowing difficulty   . Thyroid nodule    bilateral and goiter  . Vitamin D deficiency   . Wears contact lenses   . Wears dentures    partial upper    Tobacco History: Social History   Tobacco Use  Smoking Status Never Smoker  Smokeless Tobacco Never Used   Counseling given: Not Answered   Outpatient Medications Prior to Visit  Medication Sig Dispense Refill  . Ascorbic Acid (VITAMIN C CR) 1000 MG TBCR Take 1 tablet by mouth daily.    . B-D UF III MINI PEN NEEDLES 31G X 5 MM MISC Inject into the skin daily.    . BD PEN NEEDLE MICRO U/F 32G X 6 MM MISC Inject into the skin.    . cariprazine (VRAYLAR) capsule Take 3 mg by mouth daily.    . cholecalciferol (VITAMIN D) 25 MCG (1000 UNIT) tablet Take 1,000 Units by mouth daily.    [name] Kitchen. dexmethylphenidate (FOCALIN XR) 15 MG 24 hr capsule Take 1 capsule by mouth daily.    . DULoxetine (CYMBALTA) 30 MG capsule Take 30 mg by mouth daily.    [name] Kitchen. estradiol (ESTRACE) 0.1 MG/GM vaginal cream     . ferrous sulfate 324 MG TBEC Take 324 mg by mouth daily.    . flunisolide (NASALIDE) 25 MCG/ACT (0.025%) SOLN Place into the nose.    . furosemide (LASIX) 20 MG tablet Take 20 mg by mouth daily.    . INPEN 100-GRAY-LILLY DEVI as directed.    . Insulin Pen Needle 31G X 6 MM MISC SMARTSIG:1  Needle SUB-Q 4 Times Daily    . ipratropium-albuterol (DUONEB) 0.5-2.5 (3) MG/3ML SOLN Inhale into the lungs.    [name] Kitchen. JARDIANCE 25 MG TABS tablet Take 25 mg by mouth daily.    [name] Kitchen. lidocaine (LIDODERM) 5 % lidocaine 5 % topical patch  PLEASE SEE ATTACHED FOR DETAILED DIRECTIONS    . LINZESS 290 MCG CAPS capsule Take 290 mcg by mouth daily.    [name] Kitchen. liraglutide (VICTOZA) 18 MG/3ML SOPN Inject 0.5 mLs (3 mg total) into the skin daily. 5 pen 0  . LORazepam (ATIVAN) 0.5 MG tablet Take 0.5 mg by mouth 2 (two) times daily as needed.    [name] Kitchen. losartan (COZAAR) 50 MG tablet Hold until followup with outpatient doctor because your blood pressure was low on presentation to the hospital. 30 tablet 2  . mirtazapine (REMERON SOL-TAB) 30 MG disintegrating tablet Take 30 mg by mouth at bedtime as needed (Nap).   2  . montelukast (SINGULAIR) 10 MG tablet Take 10 mg by mouth daily.    .[name] Kitchen  NOVOLOG FLEXPEN 100 UNIT/ML FlexPen Inject 10 Units into the skin daily.    [name] Kitchen omeprazole (PRILOSEC) 40 MG capsule Take 40 mg by mouth daily.    . potassium chloride SA (KLOR-CON) 20 MEQ tablet Take 20 mEq by mouth daily.    . pravastatin (PRAVACHOL) 40 MG tablet Take 40 mg by mouth daily.    . predniSONE (DELTASONE) 10 MG tablet Take 50 mg daily taper by 10 mg daily then stop 15 tablet 0  . pregabalin (LYRICA) 150 MG capsule Take 150 mg by mouth 3 (three) times daily.    [name] Kitchen PROAIR RESPICLICK 108 (90 Base) MCG/ACT AEPB Inhale 2 puffs into the lungs every 4 (four) hours as needed.    [name] Kitchen tiZANidine (ZANAFLEX) 4 MG tablet Take 4 mg by mouth every 8 (eight) hours as needed for muscle spasms.     [name] Kitchen topiramate (TOPAMAX) 100 MG tablet Take 100 mg by mouth 2 (two) times daily.    Evaristo Bury FLEXTOUCH 200 UNIT/ML FlexTouch Pen 72 units per day seems very high for you.  Please follow up with your outpatient provider for further adjustment.    . TRUEPLUS PEN NEEDLES 31G X 6 MM MISC     . zolpidem (AMBIEN) 10 MG tablet Take 10 mg by mouth at bedtime.     No  facility-administered medications prior to visit.      Review of Systems  Review of Systems   Physical Exam  There were no vitals taken for this visit. Physical Exam   Lab Results:  CBC    Component Value Date/Time   WBC 8.7 09/24/2020 1049   RBC 4.55 09/24/2020 1049   HGB 13.0 09/24/2020 1049   HGB 13.6 10/19/2019 1310   HCT 40.7 09/24/2020 1049   HCT 42.1 10/19/2019 1310   PLT 265 09/24/2020 1049   PLT 359 10/19/2019 1310   MCV 89.5 09/24/2020 1049   MCV 92 10/19/2019 1310   MCV 91 11/12/2011 1627   MCH 28.6 09/24/2020 1049   MCHC 31.9 09/24/2020 1049   RDW 15.2 09/24/2020 1049   RDW 13.3 10/19/2019 1310   RDW 14.1 11/12/2011 1627   LYMPHSABS 2.1 09/12/2020 1923   LYMPHSABS 1.9 10/19/2019 1310   MONOABS 0.6 09/12/2020 1923   EOSABS 0.0 09/12/2020 1923   EOSABS 0.0 10/19/2019 1310   BASOSABS 0.0 09/12/2020 1923   BASOSABS 0.0 10/19/2019 1310    BMET    Component Value Date/Time   NA 137 09/24/2020 1049   NA 142 10/19/2019 1310   NA 137 11/12/2011 1627   K 3.4 (L) 09/24/2020 1049   K 4.9 11/12/2011 1627   CL 99 09/24/2020 1049   CL 101 11/12/2011 1627   CO2 25 09/24/2020 1049   CO2 26 11/12/2011 1627   GLUCOSE 150 (H) 09/24/2020 1049   GLUCOSE 151 (H) 11/12/2011 1627   BUN 14 09/24/2020 1049   BUN 13 10/19/2019 1310   BUN 14 11/12/2011 1627   CREATININE 0.86 09/24/2020 1049   CREATININE 0.71 11/12/2011 1627   CALCIUM 9.8 09/24/2020 1049   CALCIUM 8.5 11/12/2011 1627   GFRNONAA >60 09/24/2020 1049   GFRNONAA >60 11/12/2011 1627   GFRAA >60 05/09/2020 0535   GFRAA >60 11/12/2011 1627    BNP    Component Value Date/Time   BNP 64.0 09/12/2020 2341    ProBNP No results found for: PROBNP  Imaging: DG Chest 2 View  Result Date: 09/24/2020 CLINICAL DATA:  55 year old female with history of COVID pneumonia  with persistent cough. Seizure. EXAM: CHEST - 2 VIEW COMPARISON:  Chest x-ray 09/12/2020. FINDINGS: Lung volumes are low. No consolidative  airspace disease. No pleural effusions. No pneumothorax. No evidence of pulmonary edema. Heart size is mildly enlarged. The patient is rotated to the right on today's exam, resulting in distortion of the mediastinal contours and reduced diagnostic sensitivity and specificity for mediastinal pathology. IMPRESSION: 1. Low lung volumes without radiographic evidence of acute cardiopulmonary disease. 2. Mild cardiomegaly. Electronically Signed   By: Trudie Reed M.D.   On: 09/24/2020 13:55     Assessment & Plan:   No problem-specific Assessment & Plan notes found for this encounter.     Glenford Bayley, NP 10/20/2020

## 2020-11-08 ENCOUNTER — Encounter: Payer: Self-pay | Admitting: Gastroenterology

## 2020-11-08 ENCOUNTER — Ambulatory Visit (INDEPENDENT_AMBULATORY_CARE_PROVIDER_SITE_OTHER): Payer: Medicare Other | Admitting: Gastroenterology

## 2020-11-08 ENCOUNTER — Other Ambulatory Visit: Payer: Self-pay

## 2020-11-08 VITALS — BP 151/95 | HR 103 | Ht 65.0 in | Wt 216.0 lb

## 2020-11-08 DIAGNOSIS — K648 Other hemorrhoids: Secondary | ICD-10-CM

## 2020-11-08 DIAGNOSIS — K760 Fatty (change of) liver, not elsewhere classified: Secondary | ICD-10-CM

## 2020-11-08 DIAGNOSIS — K5904 Chronic idiopathic constipation: Secondary | ICD-10-CM

## 2020-11-08 DIAGNOSIS — K625 Hemorrhage of anus and rectum: Secondary | ICD-10-CM | POA: Diagnosis not present

## 2020-11-08 NOTE — Progress Notes (Signed)
PROCEDURE NOTE: The patient presents with symptomatic grade 2 hemorrhoids, unresponsive to maximal medical therapy, requesting rubber band ligation of his/her hemorrhoidal disease.  All risks, benefits and alternative forms of therapy were described and informed consent was obtained.  The decision was made to band the RP internal hemorrhoid, and the [facility] System was used to perform band ligation without complication.  Digital anorectal examination was then performed to assure proper positioning of the band, and to adjust the banded tissue as required.  The patient was discharged home without significant pain or other issues.  Dietary and behavioral recommendations were given and (if necessary - prescriptions were given), along with follow-up instructions.  The patient will return 2 months for follow-up and possible additional banding as required.  No complications were encountered and the patient tolerated the procedure well.

## 2020-11-08 NOTE — Progress Notes (Signed)
Arlyss Repress, MD [address]  [address]  [place], [place] [address]  Main: [phone]  Fax: [phone]    Gastroenterology Consultation  Referring Provider:     Emogene Morgan, MD Primary Care Physician:  Emogene Morgan, MD Primary Gastroenterologist:  Dr. Arlyss Repress Reason for Consultation: Elevated LFTs, fatty liver, rectal bleeding        HPI:   Amanda [name] is a 55 y.o. female referred by Dr. Emogene Morgan, MD  for consultation & management of elevated LFTs, fatty liver.  Patient has history of metabolic syndrome, poorly controlled diabetes who is referred due to elevated LFTs.  She has history of cholecystectomy in 04/2017.  Labs from care everywhere at [facility] revealed elevated transaminases AST 78, ALT 97 in 04/2019.  Last normal in 11/2018.  She had repeat LFTs acute on 07/05/2019 which revealed mildly elevated alkaline phosphatase 111, AST and ALT were normal.  She underwent right upper quadrant ultrasound in 09/2018 which revealed diffuse hepatic steatosis, also confirmed on the CT abdomen pelvis without contrast in 10/2018.  Patient reports that she has been gaining weight.  She stays at home, currently on disability, tends to snack frequently, particularly in the evening.  She used to work as an Airline pilot  She does not smoke or drink alcohol.  Denies IV drug abuse, had tattoos in the past.  Denies blood transfusion in the past.  She did not work in [employer] She denies family history of liver disease Denies use of herbal supplements, heavy NSAID use  She does have intermittent rectal bleeding, associated with constipation.  She underwent ligation of the right posterior hemorrhoid in 10/2018  Follow-up visit 10/02/2020 Patient has not seen me for 2 years.  She is here to discuss about hemorrhoid ligation due to intermittent episodes of painless rectal bleeding, approximately once a week, blood dripping into the toilet bowel.  She reports that her constipation is fairly  regulated on Linzess 290 MCG daily.  She denies any straining, however her stools are hard and lumpy.  She does report having bowel movement every day.  She is concerned about abdominal bloating.  Her diabetes is improving, hemoglobin A1c has improved from 11.7 in 12/28 to 7.9 in 1/22.  Her most recent LFTs revealed mildly elevated ALT.  She had a CT chest PE protocol which revealed possible liver cyst as well as questionable nodular contour of the liver  Follow-up visit 11/08/2020 Patient reports that she went to pick up nitroglycerin from the pharmacy, it was not ordered.  She reports having ongoing intermittent rectal bleeding.,  She also experienced post banding rectal pain for 2 to 3 days.  She reports feeling tired and she is not able to sleep well at nights.  She has also seen an endocrinologist to evaluate her thyroid and she was recommended not to undergo thyroidectomy by the surgeon at this time.  Patient is here for hemorrhoid ligation today.  She does report ongoing rectal pain as well as constipation.  She does acknowledge that her diet is devoid of fiber  NSAIDs: None  Antiplts/Anticoagulants/Anti thrombotics: None  GI Procedures: Colonoscopy 10/06/2018 - Preparation of the colon was fair. - Hemorrhoids found on perianal exam. - Stool in the entire examined colon. - Non-bleeding external and internal hemorrhoids, source of rectal bleeding - No specimens collected.  Colonoscopy by Dr. Mechele Collin in 03/2017 One diminutive polyp in the sigmoid colon, removed with a jumbo cold forceps. Resected and retrieved. - Internal  hemorrhoids. - The examination was otherwise normal.  EGD by Dr. Mechele Collin 03/2017 - Normal esophagus. - Gastritis. Biopsied. - Normal examined duodenum.  DIAGNOSIS:  A. STOMACH, ANTRUM; COLD BIOPSY:  - MODERATE CHRONIC ACTIVE GASTRITIS, HELICOBACTER [employer]YLORI [employer].  - NEGATIVE FOR DYSPLASIA AND MALIGNANCY.   B. COLON POLYP, SIGMOID; COLD BIOPSY:  - HYPERPLASTIC  EPITHELIAL CHANGE.  - NEGATIVE FOR DYSPLASIA AND MALIGNANCY.    Past Medical History:  Diagnosis Date  . ADD (attention deficit disorder)   . Anxiety   . Arthritis    knees, shoulder, upper back  . Asthma   . CFS (chronic fatigue syndrome)   . Chewing difficulty   . CHF (congestive heart failure) (HCC)   . Chronic kidney disease   . Constipation   . Depression   . Depression   . Diabetes mellitus without complication (HCC)   . Dyspnea   . Environmental allergies   . Fatty liver   . Fibromyalgia   . GERD (gastroesophageal reflux disease)   . GI bleed 10/02/2018  . Headache    migraines - 5x/mo  . Hypertension   . Hypokalemia 10/14/2017  . Hypothyroidism   . IBS (irritable bowel syndrome)   . Joint pain   . Lower extremity edema   . Major depressive disorder, single episode   . Migraine without aura and without status migrainosus, not intractable   . Migraines   . Motion sickness    ships  . Osteoarthritis   . Other specified disorders of thyroid   . Sleep apnea   . Stroke [facility])    no residual deficits  . Swallowing difficulty   . Thyroid nodule    bilateral and goiter  . Vitamin D deficiency   . Wears contact lenses   . Wears dentures    partial upper    Past Surgical History:  Procedure Laterality Date  . ABDOMINAL HYSTERECTOMY  2000's  . ABDOMINAL HYSTERECTOMY    . ABDOMINAL SURGERY     laparoscopy x4 with lysis of adhesions  . APPENDECTOMY  1986  . CESAREAN SECTION  1992  . CHOLECYSTECTOMY N/A 04/03/2017   Procedure: LAPAROSCOPIC CHOLECYSTECTOMY;  Surgeon: Henrene Dodge, MD;  Location: [facility] ORS;  Service: General;  Laterality: N/A;  . COLONOSCOPY N/A 10/06/2018   Procedure: COLONOSCOPY;  Surgeon: Toney Reil, MD;  Location: [facility] ENDOSCOPY;  Service: Gastroenterology;  Laterality: N/A;  . COLONOSCOPY WITH PROPOFOL N/A 03/10/2017   Procedure: COLONOSCOPY WITH PROPOFOL;  Surgeon: Scot Jun, MD;  Location: [facility] ENDOSCOPY;  Service: Endoscopy;   Laterality: N/A;  . ECTOPIC PREGNANCY SURGERY  2000's  . ESOPHAGOGASTRODUODENOSCOPY (EGD) WITH PROPOFOL N/A 03/10/2017   Procedure: ESOPHAGOGASTRODUODENOSCOPY (EGD) WITH PROPOFOL;  Surgeon: Scot Jun, MD;  Location: [facility] ENDOSCOPY;  Service: Endoscopy;  Laterality: N/A;  . HERNIA REPAIR    . JOINT REPLACEMENT Right 12/16/12   knee- medial - makoplasty  . KNEE ARTHROSCOPY Right 2006   partial medial and lateral meniscectomies  . KNEE ARTHROSCOPY Right 10/06/2015   Procedure: RIGHT KNEE ARTHROSCOPY WITH DEBRIDEMENT;  Surgeon: Erin Sons, MD;  Location: [facility];  Service: Orthopedics;  Laterality: Right;  Diabetic - insulin and oral meds  . ROTATOR CUFF REPAIR Left 2006  . TUBAL LIGATION  2000's    Current Outpatient Medications:  .  Ascorbic Acid (VITAMIN C CR) 1000 MG TBCR, Take 1 tablet by mouth daily., Disp: , Rfl:  .  B-D UF III MINI PEN NEEDLES 31G X 5 MM MISC, Inject into  the skin daily., Disp: , Rfl:  .  BD PEN NEEDLE MICRO U/F 32G X 6 MM MISC, Inject into the skin., Disp: , Rfl:  .  cariprazine (VRAYLAR) capsule, Take 3 mg by mouth daily., Disp: , Rfl:  .  cholecalciferol (VITAMIN D) 25 MCG (1000 UNIT) tablet, Take 1,000 Units by mouth daily., Disp: , Rfl:  .  dexmethylphenidate (FOCALIN XR) 15 MG 24 hr capsule, Take 1 capsule by mouth daily., Disp: , Rfl:  .  DULoxetine (CYMBALTA) 30 MG capsule, Take 30 mg by mouth daily., Disp: , Rfl:  .  estradiol (ESTRACE) 0.1 MG/GM vaginal cream, , Disp: , Rfl:  .  ferrous sulfate 324 MG TBEC, Take 324 mg by mouth daily., Disp: , Rfl:  .  flunisolide (NASALIDE) 25 MCG/ACT (0.025%) SOLN, Place into the nose., Disp: , Rfl:  .  furosemide (LASIX) 20 MG tablet, Take 20 mg by mouth daily., Disp: , Rfl:  .  INPEN 100-GRAY-LILLY DEVI, as directed., Disp: , Rfl:  .  Insulin Pen Needle 31G X 6 MM MISC, SMARTSIG:1 Needle SUB-Q 4 Times Daily, Disp: , Rfl:  .  ipratropium-albuterol (DUONEB) 0.5-2.5 (3) MG/3ML SOLN, Inhale into the  lungs., Disp: , Rfl:  .  JARDIANCE 25 MG TABS tablet, Take 25 mg by mouth daily., Disp: , Rfl:  .  lidocaine (LIDODERM) 5 %, lidocaine 5 % topical patch  PLEASE SEE ATTACHED FOR DETAILED DIRECTIONS, Disp: , Rfl:  .  LINZESS 290 MCG CAPS capsule, Take 290 mcg by mouth daily., Disp: , Rfl:  .  liraglutide (VICTOZA) 18 MG/3ML SOPN, Inject 0.5 mLs (3 mg total) into the skin daily., Disp: 5 pen, Rfl: 0 .  LORazepam (ATIVAN) 1 MG tablet, Take by mouth., Disp: , Rfl:  .  losartan (COZAAR) 25 MG tablet, Take 25 mg by mouth daily., Disp: , Rfl:  .  mirtazapine (REMERON SOL-TAB) 30 MG disintegrating tablet, Take 30 mg by mouth at bedtime as needed (Nap). , Disp: , Rfl: 2 .  montelukast (SINGULAIR) 10 MG tablet, Take 10 mg by mouth daily., Disp: , Rfl:  .  NOVOLOG FLEXPEN 100 UNIT/ML FlexPen, Inject 10 Units into the skin daily., Disp: , Rfl:  .  omeprazole (PRILOSEC) 40 MG capsule, Take 40 mg by mouth daily., Disp: , Rfl:  .  potassium chloride SA (KLOR-CON) 20 MEQ tablet, Take 20 mEq by mouth daily., Disp: , Rfl:  .  pravastatin (PRAVACHOL) 40 MG tablet, Take 40 mg by mouth daily., Disp: , Rfl:  .  predniSONE (DELTASONE) 10 MG tablet, Take 50 mg daily taper by 10 mg daily then stop, Disp: 15 tablet, Rfl: 0 .  pregabalin (LYRICA) 150 MG capsule, Take 150 mg by mouth 3 (three) times daily., Disp: , Rfl:  .  PROAIR RESPICLICK 108 (90 Base) MCG/ACT AEPB, Inhale 2 puffs into the lungs every 4 (four) hours as needed., Disp: , Rfl:  .  propranolol ER (INDERAL LA) 80 MG 24 hr capsule, Take 80 mg by mouth daily., Disp: , Rfl:  .  tiZANidine (ZANAFLEX) 4 MG tablet, Take 4 mg by mouth every 8 (eight) hours as needed for muscle spasms. , Disp: , Rfl:  .  topiramate (TOPAMAX) 100 MG tablet, Take 100 mg by mouth 2 (two) times daily., Disp: , Rfl:  .  TRESIBA FLEXTOUCH 200 UNIT/ML FlexTouch Pen, 72 units per day seems very high for you.  Please follow up with your outpatient provider for further adjustment., Disp: , Rfl:   .  TRUEPLUS PEN NEEDLES 31G X 6 MM MISC, , Disp: , Rfl:  .  zolpidem (AMBIEN) 10 MG tablet, Take 10 mg by mouth at bedtime., Disp: , Rfl:    Family History  Problem Relation Age of Onset  . Hypertension Mother   . Diabetes Mother   . Heart disease Mother   . Depression Mother   . Obesity Mother   . Hypertension Father   . Diabetes Father   . Allergies Father   . Kidney disease Father   . Cancer Father   . Breast cancer Neg Hx      Social History   Tobacco Use  . Smoking status: Never Smoker  . Smokeless tobacco: Never Used  Vaping Use  . Vaping Use: Never used  Substance Use Topics  . Alcohol use: Not Currently  . Drug use: Never    Allergies as of 11/08/2020 - Review Complete 11/08/2020  Allergen Reaction Noted  . Penicillins Anaphylaxis 05/28/2015  . Penicillins Anaphylaxis 05/07/2020  . Ace inhibitors Swelling 05/28/2015  . Ace inhibitors Swelling 05/07/2020    Review of Systems:    All systems reviewed and negative except where noted in HPI.   Physical Exam:  BP (!) 151/95 (BP Location: Left Arm, Patient Position: Sitting, Cuff Size: Normal)   Pulse (!) 103   Ht  (1.651 m)   Wt 216 lb (98 kg)   BMI 35.94 kg/m  No LMP recorded. Patient has had a hysterectomy.  General:   Alert,  Well-developed, well-nourished, pleasant and cooperative in NAD Head:  Normocephalic and atraumatic. Eyes:  Sclera clear, no icterus.   Conjunctiva pink. Ears:  Normal auditory acuity. Nose:  No deformity, discharge, or lesions. Mouth:  No deformity or lesions,oropharynx pink & moist. Neck:  Supple; no masses or thyromegaly. Lungs:  Respirations even and unlabored.  Clear throughout to auscultation.   No wheezes, crackles, or rhonchi. No acute distress. Heart:  Regular rate and rhythm; no murmurs, clicks, rubs, or gallops. Abdomen:  Normal bowel sounds. Soft, obese, mildly distended, tympanic to percussion, non-tender without masses, hepatosplenomegaly or hernias noted.   No guarding or rebound tenderness.   Rectal: Perianal skin tags, tender digital rectal exam, point tenderness in the anterior wall of the anal canal consistent with anal fissure Msk:  Symmetrical without gross deformities. Good, equal movement & strength bilaterally. Pulses:  Normal pulses noted. Extremities:  No clubbing or edema.  No cyanosis. Neurologic:  Alert and oriented x3;  grossly normal neurologically. Skin:  Intact without significant lesions or rashes. No jaundice. Psych:  Alert and cooperative. Normal mood and affect.  Imaging Studies: Reviewed  Assessment and Plan:   Amanda [name] is a 55 y.o. female with metabolic syndrome, fatty liver, elevated LFTs, chronic constipation with rectal bleeding secondary to hemorrhoids, history of H. pylori infection in 2018  Elevated LFTs, most likely secondary to nonalcoholic fatty liver disease Acute viral hepatitis panel is negative Secondary liver disease work-up is negative Recommend tight control of diabetes, reiterated on healthy diet and exercise Recheck LFTs every 3 to 6 months  ?  Liver cyst Recommend right upper quadrant ultrasound, has been ordered, patient did not undergo yet  History of H. pylori gastritis H. pylori breath test in 08/2019 is negative   Chronic constipation Currently on Linzess 290 MCG daily Recommended her to avoid snacking Discussed about addition of MiraLAX once a day along with Linzess Reiterated on high-fiber diet  Rectal bleeding, secondary to hemorrhoids S/p ligation of right posterior  hemorrhoid in 12/20.  She has ongoing intermittent painless rectal bleeding Perform hemorrhoid ligation today  Rectal pain secondary to anal fissure Recommend 0.125% nitroglycerin with 5% lidocaine, instructions provided [facility]    Follow up in 2 months   Arlyss Repressohini R Ranbir Chew, MD

## 2020-11-08 NOTE — Patient Instructions (Signed)
[facility] [address] [place] [address]. Phone number [phone]. Please allow at least 24 hours before picking up the compounded cream because the pharmacy has to make the medication.   High-Fiber Eating Plan Fiber, also called dietary fiber, is a type of carbohydrate. It is found foods such as fruits, vegetables, whole grains, and beans. A high-fiber diet can have many health benefits. Your health care provider may recommend a high-fiber diet to help:  Prevent constipation. Fiber can make your bowel movements more regular.  Lower your cholesterol.  Relieve the following conditions: ? Inflammation of veins in the anus (hemorrhoids). ? Inflammation of specific areas of the digestive tract (uncomplicated diverticulosis). ? A problem of the large intestine, also called the colon, that sometimes causes pain and diarrhea (irritable bowel syndrome, or IBS).  Prevent overeating as part of a weight-loss plan.  Prevent heart disease, type 2 diabetes, and certain cancers. What are tips for following this plan? Reading food labels  Check the nutrition facts label on food products for the amount of dietary fiber. Choose foods that have 5 grams of fiber or more per serving.  The goals for recommended daily fiber intake include: ? Men (age 105 or younger): 34-38 g. ? Men (over age 51): 28-34 g. ? Women (age 60 or younger): 25-28 g. ? Women (over age 56): 22-25 g. Your daily fiber goal is _____________ g.   Shopping  Choose whole fruits and vegetables instead of processed forms, such as apple juice or applesauce.  Choose a wide variety of high-fiber foods such as avocados, lentils, oats, and kidney beans.  Read the nutrition facts label of the foods you choose. Be aware of foods with added fiber. These foods often have high sugar and sodium amounts per serving. Cooking  Use whole-grain flour for baking and cooking.  Cook with brown rice instead of white rice. Meal  planning  Start the day with a breakfast that is high in fiber, such as a cereal that contains 5 g of fiber or more per serving.  Eat breads and cereals that are made with whole-grain flour instead of refined flour or white flour.  Eat brown rice, bulgur wheat, or millet instead of white rice.  Use beans in place of meat in soups, salads, and pasta dishes.  Be sure that half of the grains you eat each day are whole grains. General information  You can get the recommended daily intake of dietary fiber by: ? Eating a variety of fruits, vegetables, grains, nuts, and beans. ? Taking a fiber supplement if you are not able to take in enough fiber in your diet. It is better to get fiber through food than from a supplement.  Gradually increase how much fiber you consume. If you increase your intake of dietary fiber too quickly, you may have bloating, cramping, or gas.  Drink plenty of water to help you digest fiber.  Choose high-fiber snacks, such as berries, raw vegetables, nuts, and popcorn. What foods should I eat? Fruits Berries. Pears. Apples. Oranges. Avocado. Prunes and raisins. Dried figs. Vegetables Sweet potatoes. Spinach. Kale. Artichokes. Cabbage. Broccoli. Cauliflower. Green peas. Carrots. Squash. Grains Whole-grain breads. Multigrain cereal. Oats and oatmeal. Brown rice. Barley. Bulgur wheat. Millet. Quinoa. Bran muffins. Popcorn. Rye wafer crackers. Meats and other proteins Navy beans, kidney beans, and pinto beans. Soybeans. Split peas. Lentils. Nuts and seeds. Dairy Fiber-fortified yogurt. Beverages Fiber-fortified soy milk. Fiber-fortified orange juice. Other foods Fiber bars. The items listed above may  not be a complete list of recommended foods and beverages. Contact a dietitian for more information. What foods should I avoid? Fruits Fruit juice. Cooked, strained fruit. Vegetables Fried potatoes. Canned vegetables. Well-cooked vegetables. Grains White bread.  Pasta made with refined flour. White rice. Meats and other proteins Fatty cuts of meat. Fried chicken or fried fish. Dairy Milk. Yogurt. Cream cheese. Sour cream. Fats and oils Butters. Beverages Soft drinks. Other foods Cakes and pastries. The items listed above may not be a complete list of foods and beverages to avoid. Talk with your dietitian about what choices are best for you. Summary  Fiber is a type of carbohydrate. It is found in foods such as fruits, vegetables, whole grains, and beans.  A high-fiber diet has many benefits. It can help to prevent constipation, lower blood cholesterol, aid weight loss, and reduce your risk of heart disease, diabetes, and certain cancers.  Increase your intake of fiber gradually. Increasing fiber too quickly may cause cramping, bloating, and gas. Drink plenty of water while you increase the amount of fiber you consume.  The best sources of fiber include whole fruits and vegetables, whole grains, nuts, seeds, and beans. This information is not intended to replace advice given to you by your health care provider. Make sure you discuss any questions you have with your health care provider. Document Revised: 12/23/2019 Document Reviewed: 12/23/2019 Elsevier Patient Education  2021 [employer].

## 2020-11-13 ENCOUNTER — Encounter: Payer: Self-pay | Admitting: Emergency Medicine

## 2020-11-13 ENCOUNTER — Ambulatory Visit
Admission: EM | Admit: 2020-11-13 | Discharge: 2020-11-13 | Disposition: A | Discharge status: Home / Self Care | Payer: Medicare Other | Attending: Sports Medicine | Admitting: Sports Medicine

## 2020-11-13 ENCOUNTER — Ambulatory Visit (INDEPENDENT_AMBULATORY_CARE_PROVIDER_SITE_OTHER): Payer: Medicare Other

## 2020-11-13 ENCOUNTER — Other Ambulatory Visit: Payer: Self-pay

## 2020-11-13 ENCOUNTER — Other Ambulatory Visit: Payer: Self-pay | Admitting: *Deleted

## 2020-11-13 DIAGNOSIS — M1711 Unilateral primary osteoarthritis, right knee: Secondary | ICD-10-CM

## 2020-11-13 DIAGNOSIS — R2241 Localized swelling, mass and lump, right lower limb: Secondary | ICD-10-CM

## 2020-11-13 DIAGNOSIS — G43909 Migraine, unspecified, not intractable, without status migrainosus: Secondary | ICD-10-CM

## 2020-11-13 DIAGNOSIS — M25561 Pain in right knee: Secondary | ICD-10-CM | POA: Diagnosis not present

## 2020-11-13 MED ORDER — SUMATRIPTAN SUCCINATE 50 MG PO TABS: 50.0000 mg | ORAL_TABLET | ORAL | 0 refills | Status: AC | PRN

## 2020-11-13 NOTE — Discharge Instructions (Addendum)
Your x-rays do not show any significant arthritis or loosening of your joint replacement.  Dr. Gavin Potters has retired and I recommend you follow-up with one of his partners.  I have given you the name of one of his partners but you can call and see any provider that is available. I have given you a medication for your migraines. Please follow-up with your doctor as scheduled on Wednesday, March 16 to address this further. In the interim if you develop any worsening headache you need to call 911 or go to the emergency room.

## 2020-11-13 NOTE — Patient Outreach (Signed)
[facility] Care Management  11/13/2020  Amanda [name] 28-Mar-1966 [id number]  [facility] Care coordination  Amanda [name] was referred to [facility] on 07/04/20 by MD Referral Reason:Per notes on 07/04/20 patient reported she was unable to afford her medications most of the time Expensive medications identified are victoza, Jardinace, Breo,Latuda and Linzess Recurrent syncopal events related to medication 07/10/20 [facility] CMA confirmed on APL  Insurance:[employer] Medicare and Medicaid [place] access Last admission/ED 11/13/20 ED visit with c/o migraine headache x 3 days Right knee pain x 3 weeks PMH 2013 partial right knee repolacement 09/12/20-09/14/20 acute respiratory failure with hypoxia, pneumonia due to covid 19 virus, dehydration  07/19/20 ED for syncope, loss of consciousness, hypotension 07/03/20- 07/04/20 for recurrent syncope, depression, HTN, CHF Pt noted in ED at time of scheduled [facility] outreach  Plan [facility] RN Cm will attempt outreach to patient within the next 3-7 business days  Amanda Rendall L. Noelle Penner, RN, BSN, CCM [facility] [place] Telephonic Care Management Care Coordinator Office number [phone] Mobile number [phone]  Main [facility] number [id number] Fax number [phone]

## 2020-11-13 NOTE — ED Triage Notes (Signed)
Patient in today c/o right knee pain x 3 weeks. Patient has had a partial knee replacement on her right knee in 2013. Patient has been taking OTC Aleve,Tylenol and Ibuprofen and a prescription muscle relaxer without relief.  Patient also c/o migraine headache x 3 days. Patient has taken OTC Excedrin Migraine without relief.

## 2020-11-16 ENCOUNTER — Other Ambulatory Visit: Payer: Self-pay

## 2020-11-16 ENCOUNTER — Other Ambulatory Visit: Payer: Self-pay | Admitting: *Deleted

## 2020-11-16 NOTE — Patient Outreach (Signed)
[facility]) Care Management  11/16/2020  Amanda [name] May 16, 1966 [id number]  [facility] follow up outreach to complex care patient Amanda [name] was referred to [facility] on 07/04/20 by MD Referral Reason:Per notes on 07/04/20 patient reported she was unable to afford her medications most of the time Expensive medications identified are victoza, Jardinace, Breo,Latuda and Linzess Recurrent syncopal events related to medication 07/10/20 [facility] CMA confirmed on APL  Insurance:[employer] Medicare and Medicaid [place] access Last admission/ED 11/13/20 ED visit with c/o migraine headache x 3 days Right knee pain x 3 weeks PMH 2013 partial right knee replacement 09/12/20-09/14/20 acute respiratory failure with hypoxia, pneumonia due to covid 19 virus, dehydration  07/19/20 ED for syncope, loss of consciousness, hypotension 07/03/20- 07/04/20 for recurrent syncope, depression, HTN, CHF  Patient is able to verify HIPAA [facility] Portability and Accountability Act) identifiers Reviewed and addressed the purpose of the follow up call with the patient  Consent: [facility] ([facility] Customer service manager) RN CM reviewed [facility] services with patient. Patient gave verbal consent for services.  Follow up outreach  Amanda [name] reports she is doing fair today She has seen her pcp on 11/15/20 for her migraine and right knee pain  She is taking her medications as ordered without difficulty with side effects or purchasing them She reports she is remaining active.   Her concern voiced today is related to her hospital bill that is reported to be sent to collections She inquired about cone financial assistance  [facility] RN CM provided her with [facility] billing and financial assistance number [phone] Answered questions related to her change of insurance coverage from [employer] to [employer], beginning of the year deductible, co pays and out of pocket expenses. Encouraged to speak with staff  at [facility] billing and financial assistance related to her options and financial assistance prn  She voiced understanding and appreciation of services   Plan Patient agrees to care plan [facility] RN Cm will attempt outreach to patient within the next 60-75 business days Pt encouraged to return a call to [facility] RN CM prn   Goals Addressed              This Visit's Progress     Patient Stated   .  [facility]) Find Help in My Community (pt-stated)   On track     Follow Up Date 01/16/21   - begin a notebook of services in my neighborhood or community - follow-up on any referrals for help I am given - have a back-up plan      Notes:  3/317/22 given [place] billing/pt financial services number after voiced concern with bill- Pt to follow up 10/16/20 scales received  08/01/20 Had [facility] CMA send pt scales and advance directive forms       Johnothan Bascomb L. Noelle Penner, RN, BSN, CCM [facility] Telephonic Care Management Care Coordinator Office number [phone] Mobile number [phone]  Main [facility] number [phone] Fax number [phone]

## 2020-11-16 NOTE — ED Provider Notes (Signed)
[facility]    CSN: [id number] Arrival date & time: 11/13/20  1340      History   Chief Complaint Chief Complaint  Patient presents with  . Knee Pain    right  . Migraine    HPI October Peery is a 55 y.o. female.   Patient is a pleasant 55 year old female who presents for evaluation of the above issues.  She [facility] sees [facility] for [facility] medical care.  She has an appointment on 11/15/2020 but wanted to be seen today for the above issues.  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  Her first issue is right knee pain.  She had a partial knee replacement in the medial compartment done by Dr. Erin Sons several years ago.  He has since retired.  She has not called into [facility] to schedule appointment with one of his partners yet.  Regarding her right knee pain she has had 2 to 3 weeks of pain and she reports a little bit of swelling.  No accidents trauma or falls.  No fever shakes chills.  No nausea vomiting diarrhea.  No chest pain or shortness of breath.  All of her pain is along the medial aspect of her knee and along the medial joint line.  She denies any significant back pain numbness tingling or any evidence of lumbar radiculopathy.  The second issue is that she has a history of migraine headaches.  She has been on a triptan in the past but she has none left.  Again she is not able to get into see her primary care provider to get a refill on that.  Currently not having a headache.  She denies any headaches, vision changes, or balance issues.  When she gets the headache is like a typical migraine headache.  The triptan does help but again she is run out of her medications.  No red flag signs or symptoms appreciated on history.     Past Medical History:  Diagnosis Date  . ADD (attention deficit disorder)   . Anxiety   . Arthritis    knees, shoulder, upper back  . Asthma   . CFS (chronic  fatigue syndrome)   . Chewing difficulty   . CHF (congestive heart failure) (HCC)   . Chronic kidney disease   . Constipation   . Depression   . Depression   . Diabetes mellitus without complication (HCC)   . Dyspnea   . Environmental allergies   . Fatty liver   . Fibromyalgia   . GERD (gastroesophageal reflux disease)   . GI bleed 10/02/2018  . Headache    migraines - 5x/mo  . Hypertension   . Hypokalemia 10/14/2017  . Hypothyroidism   . IBS (irritable bowel syndrome)   . Joint pain   . Lower extremity edema   . Major depressive disorder, single episode   . Migraine without aura and without status migrainosus, not intractable   . Migraines   . Motion sickness    ships  . Osteoarthritis   . Other specified disorders of thyroid   . Sleep apnea   . Stroke [facility])    no residual deficits  . Swallowing difficulty   . Thyroid nodule    bilateral and goiter  . Vitamin D deficiency   . Wears contact lenses   . Wears dentures    partial upper    Patient Active Problem List   Diagnosis  Date Noted  . Seizure-like activity (HCC) 10/18/2020  . COVID-19   . Pneumonia due to COVID-19 virus 09/12/2020  . Hypoglycemia 07/04/2020  . Acute renal failure (HCC) 07/04/2020  . Dehydration 07/04/2020  . COPD (chronic obstructive pulmonary disease) (HCC) 07/04/2020  . Chest pain 05/07/2020  . Hypotension 05/07/2020  . Recurrent syncope 05/07/2020  . Type 2 diabetes mellitus without complication (HCC) 05/07/2020  . Asthma 05/07/2020  . Chronic congestive heart failure (HCC) 05/07/2020  . Epilepsy (HCC) 01/12/2020  . Vitamin D deficiency 01/12/2020  . Class 2 severe obesity with serious comorbidity and body mass index (BMI) of 36.0 to 36.9 in adult [facility]) 01/12/2020  . Syncope 08/27/2019  . Syncope and collapse 08/26/2019  . Asthma 07/13/2019  . Hypotension 10/10/2018  . Acute on chronic diastolic (congestive) heart failure (HCC) 07/17/2018  . Acute on chronic heart failure (HCC)  07/08/2018  . Chronic diastolic heart failure (HCC) 04/17/2018  . Lymphedema 04/17/2018  . Chest pain 03/28/2018  . Hypertension 10/15/2017  . Wears dentures 10/15/2017  . Symptomatic cholelithiasis 04/02/2017  . Recurrent incisional hernia 03/17/2017  . Calculus of gallbladder without cholecystitis without obstruction 03/17/2017  . Helicobacter pylori gastritis 03/17/2017  . Depression 09/28/2016  . Diabetes mellitus, type 2 (HCC) 09/28/2016  . Acute respiratory failure with hypoxia (HCC) 03/18/2016  . Tremor 07/01/2013  . Esophageal reflux 06/22/2012  . Major depressive disorder, single episode 08/22/2009  . Sleep apnea 05/30/2009  . Hyperlipidemia 08/10/2008    Past Surgical History:  Procedure Laterality Date  . ABDOMINAL HYSTERECTOMY  2000's  . ABDOMINAL HYSTERECTOMY    . ABDOMINAL SURGERY     laparoscopy x4 with lysis of adhesions  . APPENDECTOMY  1986  . CESAREAN SECTION  1992  . CHOLECYSTECTOMY N/A 04/03/2017   Procedure: LAPAROSCOPIC CHOLECYSTECTOMY;  Surgeon: Henrene Dodge, MD;  Location: [facility] ORS;  Service: General;  Laterality: N/A;  . COLONOSCOPY N/A 10/06/2018   Procedure: COLONOSCOPY;  Surgeon: Toney Reil, MD;  Location: [facility] ENDOSCOPY;  Service: Gastroenterology;  Laterality: N/A;  . COLONOSCOPY WITH PROPOFOL N/A 03/10/2017   Procedure: COLONOSCOPY WITH PROPOFOL;  Surgeon: Scot Jun, MD;  Location: [facility] ENDOSCOPY;  Service: Endoscopy;  Laterality: N/A;  . ECTOPIC PREGNANCY SURGERY  2000's  . ESOPHAGOGASTRODUODENOSCOPY (EGD) WITH PROPOFOL N/A 03/10/2017   Procedure: ESOPHAGOGASTRODUODENOSCOPY (EGD) WITH PROPOFOL;  Surgeon: Scot Jun, MD;  Location: [facility] ENDOSCOPY;  Service: Endoscopy;  Laterality: N/A;  . HERNIA REPAIR    . JOINT REPLACEMENT Right 12/16/12   knee- medial - makoplasty  . KNEE ARTHROSCOPY Right 2006   partial medial and lateral meniscectomies  . KNEE ARTHROSCOPY Right 10/06/2015   Procedure: RIGHT KNEE ARTHROSCOPY WITH  DEBRIDEMENT;  Surgeon: Erin Sons, MD;  Location: [facility];  Service: Orthopedics;  Laterality: Right;  Diabetic - insulin and oral meds  . ROTATOR CUFF REPAIR Left 2006  . TUBAL LIGATION  2000's    OB History    Gravida  7   Para  0   Term  0   Preterm  0   AB  0   Living        SAB  0   IAB  0   Ectopic  0   Multiple      Live Births               Home Medications    Prior to Admission medications   Medication Sig Start Date End Date Taking? Authorizing Provider  Ascorbic Acid (VITAMIN  C CR) 1000 MG TBCR Take 1 tablet by mouth daily.   Yes [provider]  cariprazine (VRAYLAR) capsule Take 3 mg by mouth daily. 07/17/20  Yes [provider]  cholecalciferol (VITAMIN D) 25 MCG (1000 UNIT) tablet Take 1,000 Units by mouth daily. 05/01/20  Yes [provider]  dexmethylphenidate (FOCALIN XR) 15 MG 24 hr capsule Take 1 capsule by mouth daily. 07/14/20  Yes [provider]  estradiol (ESTRACE) 0.1 MG/GM vaginal cream  10/15/19  Yes [provider]  ferrous sulfate 324 MG TBEC Take 324 mg by mouth daily. 08/22/20  Yes [provider]  flunisolide (NASALIDE) 25 MCG/ACT (0.025%) SOLN Place into the nose.   Yes [provider]  furosemide (LASIX) 20 MG tablet Take 20 mg by mouth daily. 07/24/20  Yes [provider]  ipratropium-albuterol (DUONEB) 0.5-2.5 (3) MG/3ML SOLN Inhale into the lungs.   Yes [provider]  JARDIANCE 25 MG TABS tablet Take 25 mg by mouth daily. 03/18/20  Yes [provider]  lidocaine (LIDODERM) 5 % lidocaine 5 % topical patch  PLEASE SEE ATTACHED FOR DETAILED DIRECTIONS 08/16/19  Yes [provider]  LINZESS 290 MCG CAPS capsule Take 290 mcg by mouth daily. 04/12/20  Yes [provider]  liraglutide (VICTOZA) 18 MG/3ML SOPN Inject 0.5 mLs (3 mg total) into the skin daily. 01/11/20  Yes Whitmire, Dawn W, FNP  LORazepam (ATIVAN) 1  MG tablet Take by mouth. 09/20/20  Yes [provider]  losartan (COZAAR) 25 MG tablet Take 25 mg by mouth daily. 10/16/20  Yes [provider]  mirtazapine (REMERON SOL-TAB) 30 MG disintegrating tablet Take 30 mg by mouth at bedtime as needed (Nap).  03/18/18  Yes [provider]  montelukast (SINGULAIR) 10 MG tablet Take 10 mg by mouth daily. 04/12/20  Yes [provider]  NOVOLOG FLEXPEN 100 UNIT/ML FlexPen Inject 10 Units into the skin daily. 05/22/20  Yes [provider]  omeprazole (PRILOSEC) 40 MG capsule Take 40 mg by mouth daily. 04/12/20  Yes [provider]  potassium chloride SA (KLOR-CON) 20 MEQ tablet Take 20 mEq by mouth daily. 04/12/20  Yes [provider]  pravastatin (PRAVACHOL) 40 MG tablet Take 40 mg by mouth daily. 04/12/20  Yes [provider]  pregabalin (LYRICA) 150 MG capsule Take 150 mg by mouth 3 (three) times daily. 04/12/20  Yes [provider]  PROAIR RESPICLICK 108 (90 Base) MCG/ACT AEPB Inhale 2 puffs into the lungs every 4 (four) hours as needed. 11/12/19  Yes [provider]  propranolol ER (INDERAL LA) 80 MG 24 hr capsule Take 80 mg by mouth daily. 10/16/20  Yes [provider]  SUMAtriptan (IMITREX) 50 MG tablet Take 1 tablet (50 mg total) by mouth every 2 (two) hours as needed for migraine. May repeat in 2 hours if headache persists or recurs.  Not to exceed 4 pills in a 24-hour period.  If you find you need more medication and you have taken 4 pills, please go directly to the emergency room or call 911. 11/13/20  Yes Delton See, MD  tiZANidine (ZANAFLEX) 4 MG tablet Take 4 mg by mouth every 8 (eight) hours as needed for muscle spasms.    Yes [provider]  topiramate (TOPAMAX) 100 MG tablet Take 100 mg by mouth 2 (two) times daily. 04/12/20  Yes [provider]  TRESIBA FLEXTOUCH 200 UNIT/ML FlexTouch Pen 72 units per day seems very high for you.  Please  follow up with your outpatient provider for further adjustment. 07/04/20  Yes Darlin PriestlyLai, Tina, MD  zolpidem (AMBIEN) 10 MG tablet Take 10 mg by mouth at bedtime. 06/23/20  Yes [provider]  B-D UF III MINI PEN NEEDLES 31G X 5 MM MISC Inject into the skin daily. 03/18/20   [provider]  BD PEN NEEDLE MICRO U/F 32G X 6 MM MISC Inject into the skin. 06/14/20   [provider]  DULoxetine (CYMBALTA) 30 MG capsule Take 30 mg by mouth daily. 06/07/20   [provider]  INPEN 100-GRAY-LILLY DEVI as directed. 06/13/20   [provider]  Insulin Pen Needle 31G X 6 MM MISC SMARTSIG:1 Needle SUB-Q 4 Times Daily 05/19/20   [provider]  predniSONE (DELTASONE) 10 MG tablet Take 50 mg daily taper by 10 mg daily then stop 09/14/20   Enedina FinnerPatel, Sona, MD  TRUEPLUS PEN NEEDLES 31G X 6 MM MISC  05/22/20   [provider]    Family History Family History  Problem Relation Age of Onset  . Hypertension Mother   . Diabetes Mother   . Heart disease Mother   . Depression Mother   . Obesity Mother   . Hypertension Father   . Diabetes Father   . Allergies Father   . Kidney disease Father   . Cancer Father   . Breast cancer Neg Hx     Social History Social History   Tobacco Use  . Smoking status: Never Smoker  . Smokeless tobacco: Never Used  Vaping Use  . Vaping Use: Never used  Substance Use Topics  . Alcohol use: Not Currently  . Drug use: Never     Allergies   Penicillins, Penicillins, Ace inhibitors, and Ace inhibitors   Review of Systems Review of Systems  Constitutional: Negative.  Negative for activity change, appetite change, chills, diaphoresis, fatigue and fever.  HENT: Negative.  Negative for congestion, ear pain, sinus pressure and sinus pain.   Eyes: Negative.  Negative for photophobia, pain and visual disturbance.  Respiratory: Negative.  Negative for cough, chest tightness and shortness of breath.   Cardiovascular:  Negative.  Negative for chest pain and palpitations.  Gastrointestinal: Negative.  Negative for abdominal pain.  Genitourinary: Negative.  Negative for dysuria.  Musculoskeletal: Positive for arthralgias, gait problem and joint swelling. Negative for back pain, myalgias, neck pain and neck stiffness.  Skin: Negative.  Negative for color change, rash and wound.  Neurological: Positive for headaches. Negative for dizziness, tremors, seizures, syncope, speech difficulty, weakness, light-headedness and numbness.  Psychiatric/Behavioral: Negative for confusion.  All other systems reviewed and are negative.    Physical Exam Triage Vital Signs ED Triage Vitals  Enc Vitals Group     BP 11/13/20 1459 112/76     Pulse Rate 11/13/20 1459 78     Resp 11/13/20 1459 18     Temp 11/13/20 1459 98.2 F (36.8 C)     Temp Source 11/13/20 1459 Oral     SpO2 11/13/20 1459 97 %     Weight 11/13/20 1500 210 lb (95.3 kg)     Height 11/13/20 1500 5\' 5"  (1.651 m)     Head Circumference --      Peak Flow --      Pain Score 11/13/20 1458 10     Pain Loc --      Pain Edu? --      Excl. in GC? --    No data found.  Updated Vital  Signs BP 112/76 (BP Location: Left Arm)   Pulse 78   Temp 98.2 F (36.8 C) (Oral)   Resp 18   Ht 5\' 5"  (1.651 m)   Wt 95.3 kg   SpO2 97%   BMI 34.95 kg/m   Visual Acuity Right Eye Distance:   Left Eye Distance:   Bilateral Distance:    Right Eye Near:   Left Eye Near:    Bilateral Near:     Physical Exam Vitals and nursing note reviewed.  Constitutional:      General: She is not in acute distress.    Appearance: Normal appearance. She is not ill-appearing, toxic-appearing or diaphoretic.  HENT:     Head: Normocephalic and atraumatic.     Right Ear: Tympanic membrane normal.     Left Ear: Tympanic membrane normal.     Nose: Nose normal. No congestion or rhinorrhea.     Mouth/Throat:     Mouth: Mucous membranes are moist.     Pharynx: Oropharynx is clear. No  oropharyngeal exudate or posterior oropharyngeal erythema.  Eyes:     General: No scleral icterus.       Right eye: No discharge.        Left eye: No discharge.     Extraocular Movements: Extraocular movements intact.     Conjunctiva/sclera: Conjunctivae normal.     Pupils: Pupils are equal, round, and reactive to light.  Cardiovascular:     Rate and Rhythm: Normal rate and regular rhythm.     Pulses: Normal pulses.     Heart sounds: Normal heart sounds.  Pulmonary:     Effort: Pulmonary effort is normal.     Breath sounds: Normal breath sounds.  Musculoskeletal:     Cervical back: Normal range of motion and neck supple.  Skin:    General: Skin is warm and dry.     Capillary Refill: Capillary refill takes less than 2 seconds.     Findings: No rash.  Neurological:     General: No focal deficit present.     Mental Status: She is alert and oriented to person, place, and time.     Cranial Nerves: No cranial nerve deficit.     Sensory: No sensory deficit.     Motor: No weakness.     Coordination: Coordination normal.      UC Treatments / Results  Labs (all labs ordered are listed, but only abnormal results are displayed) Labs Reviewed - No data to display  EKG   Radiology  EXAM: RIGHT KNEE - COMPLETE 4+ VIEW  COMPARISON:  May 28, 2015.  FINDINGS: No evidence of fracture, dislocation, or joint effusion. Status post right medial hemiarthroplasty. Mild narrowing and osteophyte formation is seen involving the lateral joint space. Soft tissues are unremarkable.  IMPRESSION: Status post right medial hemiarthroplasty. Mild degenerative joint disease is noted laterally.   Electronically Signed   By: May 30, 2015 M.D.   On: 11/13/2020 16:08  Procedures Procedures (including critical care time)  Medications Ordered in UC Medications - No data to display  Initial Impression / Assessment and Plan / UC Course  I have reviewed the triage vital signs and  the nursing notes.  Pertinent labs & imaging results that were available during my care of the patient were reviewed by me and considered in my medical decision making (see chart for details).  Clinical impression: 1.  Right medial knee pain, status post partial knee replacement. 2.  History of migraine headaches.  Currently asymptomatic.  She has run out of her triptan medication.  Has a appointment with her primary care provider in 2 days.  Treatment plan: 1.  The findings and treatment plan were discussed in detail with the patient.  Patient was in agreement. 2.  Recommended getting an x-ray of her right knee.  Results are above.  Just show some minimal degenerative changes in the lateral compartment.  The hardware is in good position in the medial compartment. 3.  Regarding her knee I recommended she make a follow-up appointment with the folks over at [facility].  Gave her the contact number. 4.  Regarding her migraine headaches, I will renew her triptan today.  Currently asymptomatic and not having a headache.  She has a follow-up appointment with her primary care provider in 2 days.  They can address this further at that visit.  She may need neurology to follow along if she continues to have migraine headaches on a regular basis. 5.  I did go over the red flag signs and symptoms and what to look for regarding her headache and when to seek out immediate medical attention by calling 911 or going to the ER.  She voiced verbal understanding. 6.  Educational handouts were provided. 7.  Just supportive care, over-the-counter meds as needed. 8.  I offered a work note or school note she indicated she did not need either. 9.  Follow-up here as needed.    Final Clinical Impressions(s) / UC Diagnoses   Final diagnoses:  Right medial knee pain  Primary osteoarthritis of right knee  Migraine without status migrainosus, not intractable, unspecified migraine type     Discharge  Instructions     Your x-rays do not show any significant arthritis or loosening of your joint replacement.  Dr. Gavin Potters has retired and I recommend you follow-up with one of his partners.  I have given you the name of one of his partners but you can call and see any provider that is available. I have given you a medication for your migraines. Please follow-up with your doctor as scheduled on Wednesday, March 16 to address this further. In the interim if you develop any worsening headache you need to call 911 or go to the emergency room.    ED Prescriptions    Medication Sig Dispense Auth. Provider   SUMAtriptan (IMITREX) 50 MG tablet Take 1 tablet (50 mg total) by mouth every 2 (two) hours as needed for migraine. May repeat in 2 hours if headache persists or recurs.  Not to exceed 4 pills in a 24-hour period.  If you find you need more medication and you have taken 4 pills, please go directly to the emergency room or call 911. 10 tablet Delton See, MD     PDMP not reviewed this encounter.   Delton See, MD 11/16/20 [phone]

## 2020-12-11 ENCOUNTER — Ambulatory Visit (INDEPENDENT_AMBULATORY_CARE_PROVIDER_SITE_OTHER): Payer: Medicare Other | Admitting: Internal Medicine

## 2020-12-11 VITALS — BP 148/93 | HR 80 | Temp 98.8°F | Resp 18 | Ht 65.0 in | Wt 215.0 lb

## 2020-12-11 DIAGNOSIS — K219 Gastro-esophageal reflux disease without esophagitis: Secondary | ICD-10-CM

## 2020-12-11 DIAGNOSIS — G4733 Obstructive sleep apnea (adult) (pediatric): Secondary | ICD-10-CM | POA: Insufficient documentation

## 2020-12-11 DIAGNOSIS — I5032 Chronic diastolic (congestive) heart failure: Secondary | ICD-10-CM

## 2020-12-11 DIAGNOSIS — I509 Heart failure, unspecified: Secondary | ICD-10-CM

## 2020-12-11 DIAGNOSIS — J4540 Moderate persistent asthma, uncomplicated: Secondary | ICD-10-CM

## 2020-12-11 DIAGNOSIS — J449 Chronic obstructive pulmonary disease, unspecified: Secondary | ICD-10-CM

## 2020-12-11 DIAGNOSIS — I10 Essential (primary) hypertension: Secondary | ICD-10-CM | POA: Diagnosis not present

## 2020-12-11 DIAGNOSIS — E611 Iron deficiency: Secondary | ICD-10-CM

## 2020-12-11 NOTE — Progress Notes (Signed)
[facility] [address] [place], [place] [address]  Pulmonary Sleep Medicine   Office Visit Note  Patient Name: Amanda [name] DOB: 07/23/1966 MRN [id number]    Chief Complaint: Obstructive Sleep Apnea visit  Brief History:  [name] is seen today for initial consultation The patient has a 8 year history of sleep apnea. She had a CPAP machine but it was lost in a move approximately 5 years ago after she had a stroke.   The patient and her daughter report that she slept well with CPAP when she had one and reports benefiting from it. She did note runny nose and dry mouth with it. Without the CPAP her daughter reports she is snoring and stopping breathing.  Her sleep quality is very poor now. She goes to bed around 10-11 p.m. and wakes around 3-4 a.m. and can't return to sleep. She is very sleepy during the day and will nap. the Epworth Sleepiness Score is 15 ut of 24.  The patient has been diagnosed with RLS and reports continuing symptoms.  She takes Remeron but usually takes it during the day to help with napping.  ROS  General: (-) fever, (-) chills, (-) night sweat Nose and Sinuses: (-) nasal stuffiness or itchiness, (-) postnasal drip, (-) nosebleeds, (-) sinus trouble. Mouth and Throat: (-) sore throat, (-) hoarseness. Neck: (-) swollen glands, (-) enlarged thyroid, (-) neck pain. Respiratory: - cough, - shortness of breath, - wheezing. Neurologic: neuropathy in  feet due to diabetes Psychiatric: anxiety and depression and she is receiving therapy for this as well as medication.    Current Medication: Outpatient Encounter Medications as of 12/11/2020  Medication Sig Note  . Ascorbic Acid (VITAMIN C CR) 1000 MG TBCR Take 1 tablet by mouth daily.   . B-D UF III MINI PEN NEEDLES 31G X 5 MM MISC Inject into the skin daily.   . BD PEN NEEDLE MICRO U/F 32G X 6 MM MISC Inject into the skin.   . cariprazine (VRAYLAR) capsule Take 3 mg by mouth daily.   . cholecalciferol  (VITAMIN D) 25 MCG (1000 UNIT) tablet Take 1,000 Units by mouth daily.   [name] Kitchen dexmethylphenidate (FOCALIN XR) 15 MG 24 hr capsule Take 1 capsule by mouth daily. 08/15/2020: Pt reports it is ordered qd but she takes prn   . DULoxetine (CYMBALTA) 30 MG capsule Take 30 mg by mouth daily. 08/15/2020: Pt stopped this medicine  . estradiol (ESTRACE) 0.1 MG/GM vaginal cream  11/13/2020: prn  . ferrous sulfate 324 MG TBEC Take 324 mg by mouth daily.   . flunisolide (NASALIDE) 25 MCG/ACT (0.025%) SOLN Place into the nose. 08/15/2020: For sinsus infection pt takes prn Has not taken in a while  . furosemide (LASIX) 20 MG tablet Take 20 mg by mouth daily.   . INPEN 100-GRAY-LILLY DEVI as directed.   . Insulin Pen Needle 31G X 6 MM MISC SMARTSIG:1 Needle SUB-Q 4 Times Daily   . ipratropium-albuterol (DUONEB) 0.5-2.5 (3) MG/3ML SOLN Inhale into the lungs.   [name] Kitchen JARDIANCE 25 MG TABS tablet Take 25 mg by mouth daily.   [name] Kitchen lidocaine (LIDODERM) 5 % lidocaine 5 % topical patch  PLEASE SEE ATTACHED FOR DETAILED DIRECTIONS 08/15/2020: Prn 12 hours on 12 hours off  . LINZESS 290 MCG CAPS capsule Take 290 mcg by mouth daily.   [name] Kitchen liraglutide (VICTOZA) 18 MG/3ML SOPN Inject 0.5 mLs (3 mg total) into the skin daily.   [name] Kitchen LORazepam (ATIVAN) 1 MG tablet Take by mouth.   [name] Kitchen  losartan (COZAAR) 25 MG tablet Take 25 mg by mouth daily.   . mirtazapine (REMERON SOL-TAB) 30 MG disintegrating tablet Take 30 mg by mouth at bedtime as needed (Nap).    . montelukast (SINGULAIR) 10 MG tablet Take 10 mg by mouth daily. 08/15/2020: Take at hs  . NOVOLOG FLEXPEN 100 UNIT/ML FlexPen Inject 10 Units into the skin daily. 11/13/2020: 10 units per meal  . omeprazole (PRILOSEC) 40 MG capsule Take 40 mg by mouth daily.   . potassium chloride SA (KLOR-CON) 20 MEQ tablet Take 20 mEq by mouth daily.   . pravastatin (PRAVACHOL) 40 MG tablet Take 40 mg by mouth daily.   . predniSONE (DELTASONE) 10 MG tablet Take 50 mg daily taper by 10 mg daily then stop    . pregabalin (LYRICA) 150 MG capsule Take 150 mg by mouth 3 (three) times daily. 08/15/2020: Usually qd She only takes q hs  . PROAIR RESPICLICK 108 (90 Base) MCG/ACT AEPB Inhale 2 puffs into the lungs every 4 (four) hours as needed.   . propranolol ER (INDERAL LA) 80 MG 24 hr capsule Take 80 mg by mouth daily.   . SUMAtriptan (IMITREX) 50 MG tablet Take 1 tablet (50 mg total) by mouth every 2 (two) hours as needed for migraine. May repeat in 2 hours if headache persists or recurs.  Not to exceed 4 pills in a 24-hour period.  If you find you need more medication and you have taken 4 pills, please go directly to the emergency room or call 911.   [name] Kitchen tiZANidine (ZANAFLEX) 4 MG tablet Take 4 mg by mouth every 8 (eight) hours as needed for muscle spasms.  08/15/2020: fibromyalgia  . topiramate (TOPAMAX) 100 MG tablet Take 100 mg by mouth 2 (two) times daily. 08/15/2020: Use for epilepsy  . TRESIBA FLEXTOUCH 200 UNIT/ML FlexTouch Pen 72 units per day seems very high for you.  Please follow up with your outpatient provider for further adjustment. 08/15/2020: Takes 84 units per pt  . TRUEPLUS PEN NEEDLES 31G X 6 MM MISC    . zolpidem (AMBIEN) 10 MG tablet Take 10 mg by mouth at bedtime. 08/15/2020: Prn    No facility-administered encounter medications on file as of 12/11/2020.    Surgical History: Past Surgical History:  Procedure Laterality Date  . ABDOMINAL HYSTERECTOMY  2000's  . ABDOMINAL HYSTERECTOMY    . ABDOMINAL SURGERY     laparoscopy x4 with lysis of adhesions  . APPENDECTOMY  1986  . CESAREAN SECTION  1992  . CHOLECYSTECTOMY N/A 04/03/2017   Procedure: LAPAROSCOPIC CHOLECYSTECTOMY;  Surgeon: Henrene Dodge, MD;  Location: [facility] ORS;  Service: General;  Laterality: N/A;  . COLONOSCOPY N/A 10/06/2018   Procedure: COLONOSCOPY;  Surgeon: Toney Reil, MD;  Location: [facility] ENDOSCOPY;  Service: Gastroenterology;  Laterality: N/A;  . COLONOSCOPY WITH PROPOFOL N/A 03/10/2017   Procedure:  COLONOSCOPY WITH PROPOFOL;  Surgeon: Scot Jun, MD;  Location[facility] ENDOSCOPY;  Service: Endoscopy;  Laterality: N/A;  . ECTOPIC PREGNANCY SURGERY  2000's  . ESOPHAGOGASTRODUODENOSCOPY (EGD) WITH PROPOFOL N/A 03/10/2017   Procedure: ESOPHAGOGASTRODUODENOSCOPY (EGD) WITH PROPOFOL;  Surgeon: Scot Jun, MD;  Location: [facility] ENDOSCOPY;  Service: Endoscopy;  Laterality: N/A;  . HERNIA REPAIR    . JOINT REPLACEMENT Right 12/16/12   knee- medial - makoplasty  . KNEE ARTHROSCOPY Right 2006   partial medial and lateral meniscectomies  . KNEE ARTHROSCOPY Right 10/06/2015   Procedure: RIGHT KNEE ARTHROSCOPY WITH DEBRIDEMENT;  Surgeon: Erin Sons, MD;  Location: [facility];  Service: Orthopedics;  Laterality: Right;  Diabetic - insulin and oral meds  . ROTATOR CUFF REPAIR Left 2006  . TUBAL LIGATION  2000's    Medical History: Past Medical History:  Diagnosis Date  . ADD (attention deficit disorder)   . Anxiety   . Arthritis    knees, shoulder, upper back  . Asthma   . CFS (chronic fatigue syndrome)   . Chewing difficulty   . CHF (congestive heart failure) (HCC)   . Chronic kidney disease   . Constipation   . Depression   . Depression   . Diabetes mellitus without complication (HCC)   . Dyspnea   . Environmental allergies   . Fatty liver   . Fibromyalgia   . GERD (gastroesophageal reflux disease)   . GI bleed 10/02/2018  . Headache    migraines - 5x/mo  . Hypertension   . Hypokalemia 10/14/2017  . Hypothyroidism   . IBS (irritable bowel syndrome)   . Joint pain   . Lower extremity edema   . Major depressive disorder, single episode   . Migraine without aura and without status migrainosus, not intractable   . Migraines   . Motion sickness    ships  . Osteoarthritis   . Other specified disorders of thyroid   . Sleep apnea   . Stroke [facility])    no residual deficits  . Swallowing difficulty   . Thyroid nodule    bilateral and goiter  . Vitamin D  deficiency   . Wears contact lenses   . Wears dentures    partial upper    Family History: Non contributory to the present illness  Social History: Social History   Socioeconomic History  . Marital status: Widowed    Spouse name: Luretha Eberly  . Number of children: Not on file  . Years of education: college  . Highest education level: Not on file  Occupational History  . Occupation: Airline pilot  Tobacco Use  . Smoking status: Never Smoker  . Smokeless tobacco: Never Used  Vaping Use  . Vaping Use: Never used  Substance and Sexual Activity  . Alcohol use: Not Currently  . Drug use: Never  . Sexual activity: Yes    Birth control/protection: Surgical  Other Topics Concern  . Not on file  Social History Narrative   ** Merged History Encounter **       Lives at home with son (who is mentally ill)   Ambulates independently.   Husband Dorinda Hill passed in June 2021   Daughter TT   Social Determinants of Health   Financial Resource Strain: Low Risk   . Difficulty of Paying Living Expenses: Not very hard  Food Insecurity: No Food Insecurity  . Worried About Programme researcher, broadcasting/film/video in the Last Year: Never true  . Ran Out of Food in the Last Year: Never true  Transportation Needs: No Transportation Needs  . Lack of Transportation (Medical): No  . Lack of Transportation (Non-Medical): No  Physical Activity: Not on file  Stress: No Stress Concern Present  . Feeling of Stress : Only a little  Social Connections: Moderately Integrated  . Frequency of Communication with Friends and Family: More than three times a week  . Frequency of Social Gatherings with Friends and Family: More than three times a week  . Attends Religious Services: 1 to 4 times per year  . Active Member of Clubs or Organizations: Yes  . Attends Banker Meetings:  1 to 4 times per year  . Marital Status: Widowed  Intimate Partner Violence: Not At Risk  . Fear of Current or Ex-Partner: No  .  Emotionally Abused: No  . Physically Abused: No  . Sexually Abused: No    Vital Signs: Blood pressure (!) 148/93, pulse 80, temperature 98.8 F (37.1 C), temperature source Temporal, resp. rate 18, height 5\' 5"  (1.651 m), weight 215 lb (97.5 kg), SpO2 98 %.  Examination: General Appearance: The patient is well-developed, well-nourished, and in no distress. Neck Circumference: 37 Skin: Gross inspection of skin unremarkable. Head: normocephalic, no gross deformities. Eyes: no gross deformities noted. ENT: ears appear grossly normal Neurologic: Alert and oriented. No involuntary movements.    EPWORTH SLEEPINESS SCALE:  Scale:  (0)= no chance of dozing; (1)= slight chance of dozing; (2)= moderate chance of dozing; (3)= high chance of dozing  Chance  Situtation    Sitting and reading: 3    Watching TV: 3    Sitting Inactive in public: 2    As a passenger in car: 1      Lying down to rest: 2    Sitting and talking: 2    Sitting quielty after lunch: 2    In a car, stopped in traffic: 0   TOTAL SCORE:   15 out of 24    SLEEP STUDIES:  1. No studies on file   CPAP COMPLIANCE DATA:  No longer has a machine.        LABS: Recent Results (from the past 2160 hour(s))  CBC with Differential     Status: None   Collection Time: 09/12/20  7:23 PM  Result Value Ref Range   WBC 6.5 4.0 - 10.5 K/uL   RBC 4.24 3.87 - 5.11 MIL/uL   Hemoglobin 12.3 12.0 - 15.0 g/dL   HCT 11/10/20 48.2 - 50.0 %   MCV 89.9 80.0 - 100.0 fL   MCH 29.0 26.0 - 34.0 pg   MCHC 32.3 30.0 - 36.0 g/dL   RDW 37.0 48.8 - 89.1 %   Platelets 318 150 - 400 K/uL   nRBC 0.0 0.0 - 0.2 %   Neutrophils Relative % 57 %   Neutro Abs 3.7 1.7 - 7.7 K/uL   Lymphocytes Relative 32 %   Lymphs Abs 2.1 0.7 - 4.0 K/uL   Monocytes Relative 9 %   Monocytes Absolute 0.6 0.1 - 1.0 K/uL   Eosinophils Relative 0 %   Eosinophils Absolute 0.0 0.0 - 0.5 K/uL   Basophils Relative 1 %   Basophils Absolute 0.0 0.0 -  0.1 K/uL   Immature Granulocytes 1 %   Abs Immature Granulocytes 0.03 0.00 - 0.07 K/uL    Comment: Performed at [facility], [address] [place], [place]  Comprehensive metabolic panel     Status: Abnormal   Collection Time: 09/12/20  7:23 PM  Result Value Ref Range   Sodium 135 135 - 145 mmol/L   Potassium 4.3 3.5 - 5.1 mmol/L   Chloride 98 98 - 111 mmol/L   CO2 27 22 - 32 mmol/L   Glucose, Bld 229 (H) 70 - 99 mg/dL    Comment: Glucose reference range applies only to samples taken after fasting for at least 8 hours.   BUN 16 6 - 20 mg/dL   Creatinine, Ser 11/10/20 (H) 0.44 - 1.00 mg/dL   Calcium 9.0 8.9 - 8.28 mg/dL   Total Protein 6.9 6.5 - 8.1 g/dL   Albumin  3.7 3.5 - 5.0 g/dL   AST 32 15 - 41 U/L   ALT 39 0 - 44 U/L   Alkaline Phosphatase 110 38 - 126 U/L   Total Bilirubin 0.6 0.3 - 1.2 mg/dL   GFR, Estimated 48 (L) >60 mL/min    Comment: (NOTE) Calculated using the CKD-EPI Creatinine Equation (2021)    Anion gap 10 5 - 15    Comment: Performed at [facility], [address] [place], [place] [address]  Troponin I (High Sensitivity)     Status: None   Collection Time: 09/12/20  7:23 PM  Result Value Ref Range   Troponin I (High Sensitivity) 5 <18 ng/L    Comment: (NOTE) Elevated high sensitivity troponin I (hsTnI) values and significant  changes across serial measurements may suggest ACS but many other  chronic and acute conditions are known to elevate hsTnI results.  Refer to the "Links" section for chest pain algorithms and additional  guidance. Performed at [facility], [address] [place], [place] [address]   Brain natriuretic peptide     Status: None   Collection Time: 09/12/20  7:23 PM  Result Value Ref Range   B Natriuretic Peptide 30.0 0.0 - 100.0 pg/mL    Comment: Performed at [facility], [address] [place], [place] [address]  Lactic acid, plasma     Status: Abnormal   Collection Time: 09/12/20   7:23 PM  Result Value Ref Range   Lactic Acid, Venous 2.1 (HH) 0.5 - 1.9 mmol/L    Comment: CRITICAL RESULT CALLED TO, READ BACK BY AND VERIFIED WITH ROBIN REGISTER,RN 2019 09/12/2020 DB Performed at [facility], [address] [place], [place] [address]   Blood culture (routine x 2)     Status: None   Collection Time: 09/12/20  7:23 PM   Specimen: BLOOD  Result Value Ref Range   Specimen Description BLOOD LEFT ANTECUBITAL    Special Requests      BOTTLES DRAWN AEROBIC AND ANAEROBIC Blood Culture adequate volume   Culture      NO GROWTH 5 DAYS Performed at [facility], [address], [place], [place] [address]    Report Status 09/17/2020 FINAL   Blood culture (routine x 2)     Status: None   Collection Time: 09/12/20  7:23 PM   Specimen: BLOOD  Result Value Ref Range   Specimen Description BLOOD RIGHT ANTECUBITAL    Special Requests      BOTTLES DRAWN AEROBIC AND ANAEROBIC Blood Culture adequate volume   Culture      NO GROWTH 5 DAYS Performed at [facility], [address] [place], [place] [address]    Report Status 09/17/2020 FINAL   Procalcitonin - Baseline     Status: None   Collection Time: 09/12/20  7:23 PM  Result Value Ref Range   Procalcitonin <0.10 ng/mL    Comment:        Interpretation: PCT (Procalcitonin) <= 0.5 ng/mL: Systemic infection (sepsis) is not likely. Local bacterial infection is possible. (NOTE)       Sepsis PCT Algorithm           Lower Respiratory Tract                                      Infection PCT Algorithm    ----------------------------     ----------------------------  PCT < 0.25 ng/mL                PCT < 0.10 ng/mL          Strongly encourage             Strongly discourage   discontinuation of antibiotics    initiation of antibiotics    ----------------------------     -----------------------------       PCT 0.25 - 0.50 ng/mL            PCT 0.10 - 0.25 ng/mL               OR       >80%  decrease in PCT            Discourage initiation of                                            antibiotics      Encourage discontinuation           of antibiotics    ----------------------------     -----------------------------         PCT >= 0.50 ng/mL              PCT 0.26 - 0.50 ng/mL               AND        <80% decrease in PCT             Encourage initiation of                                             antibiotics       Encourage continuation           of antibiotics    ----------------------------     -----------------------------        PCT >= 0.50 ng/mL                  PCT > 0.50 ng/mL               AND         increase in PCT                  Strongly encourage                                      initiation of antibiotics    Strongly encourage escalation           of antibiotics                                     -----------------------------                                           PCT <= 0.25 ng/mL  OR                                        > 80% decrease in PCT                                      Discontinue / Do not initiate                                             antibiotics  Performed at [facility], [address] [place], [place] [address]   Lactic acid, plasma     Status: None   Collection Time: 09/12/20  9:50 PM  Result Value Ref Range   Lactic Acid, Venous 1.5 0.5 - 1.9 mmol/L    Comment: Performed at [facility], [address] [place], [place] [address]  Troponin I (High Sensitivity)     Status: None   Collection Time: 09/12/20  9:50 PM  Result Value Ref Range   Troponin I (High Sensitivity) 4 <18 ng/L    Comment: (NOTE) Elevated high sensitivity troponin I (hsTnI) values and significant  changes across serial measurements may suggest ACS but many other  chronic and acute conditions are known to elevate hsTnI results.  Refer to the "Links" section for chest pain  algorithms and additional  guidance. Performed at [facility], [address] [place], [place] [address]   Brain natriuretic peptide     Status: None   Collection Time: 09/12/20 11:41 PM  Result Value Ref Range   B Natriuretic Peptide 64.0 0.0 - 100.0 pg/mL    Comment: Performed at [facility], [address] [place], [place] [address]  C-reactive protein     Status: Abnormal   Collection Time: 09/12/20 11:41 PM  Result Value Ref Range   CRP 1.1 (H) <1.0 mg/dL    Comment: Performed at [facility], [address], [place], [place] [address]  Ferritin     Status: None   Collection Time: 09/12/20 11:41 PM  Result Value Ref Range   Ferritin 33 11 - 307 ng/mL    Comment: Performed at [facility], [address] [place], [place] [address]  Fibrinogen     Status: Abnormal   Collection Time: 09/12/20 11:41 PM  Result Value Ref Range   Fibrinogen 530 (H) 210 - 475 mg/dL    Comment: Performed at [facility], [address] [place], [place] [address]  Procalcitonin     Status: None   Collection Time: 09/13/20  5:04 AM  Result Value Ref Range   Procalcitonin <0.10 ng/mL    Comment:        Interpretation: PCT (Procalcitonin) <= 0.5 ng/mL: Systemic infection (sepsis) is not likely. Local bacterial infection is possible. (NOTE)       Sepsis PCT Algorithm           Lower Respiratory Tract                                      Infection PCT Algorithm    ----------------------------     ----------------------------  PCT < 0.25 ng/mL                PCT < 0.10 ng/mL          Strongly encourage             Strongly discourage   discontinuation of antibiotics    initiation of antibiotics    ----------------------------     -----------------------------       PCT 0.25 - 0.50 ng/mL            PCT 0.10 - 0.25 ng/mL               OR       >80% decrease in PCT            Discourage initiation of                                             antibiotics      Encourage discontinuation           of antibiotics    ----------------------------     -----------------------------         PCT >= 0.50 ng/mL              PCT 0.26 - 0.50 ng/mL               AND        <80% decrease in PCT             Encourage initiation of                                             antibiotics       Encourage continuation           of antibiotics    ----------------------------     -----------------------------        PCT >= 0.50 ng/mL                  PCT > 0.50 ng/mL               AND         increase in PCT                  Strongly encourage                                      initiation of antibiotics    Strongly encourage escalation           of antibiotics                                     -----------------------------                                           PCT <= 0.25 ng/mL  OR                                        > 80% decrease in PCT                                      Discontinue / Do not initiate                                             antibiotics  Performed at [facility], [address] [place], [place] [address]   Comprehensive metabolic panel     Status: Abnormal   Collection Time: 09/13/20  5:04 AM  Result Value Ref Range   Sodium 138 135 - 145 mmol/L   Potassium 4.3 3.5 - 5.1 mmol/L   Chloride 102 98 - 111 mmol/L   CO2 25 22 - 32 mmol/L   Glucose, Bld 188 (H) 70 - 99 mg/dL    Comment: Glucose reference range applies only to samples taken after fasting for at least 8 hours.   BUN 13 6 - 20 mg/dL   Creatinine, Ser 2.13 0.44 - 1.00 mg/dL   Calcium 9.5 8.9 - 08.6 mg/dL   Total Protein 7.8 6.5 - 8.1 g/dL   Albumin 4.0 3.5 - 5.0 g/dL   AST 40 15 - 41 U/L   ALT 54 (H) 0 - 44 U/L   Alkaline Phosphatase 125 38 - 126 U/L   Total Bilirubin 0.8 0.3 - 1.2 mg/dL   GFR, Estimated >57 >84 mL/min    Comment: (NOTE) Calculated using the CKD-EPI Creatinine  Equation (2021)    Anion gap 11 5 - 15    Comment: Performed at [facility], [address] [place], [place] [address]  C-reactive protein     Status: Abnormal   Collection Time: 09/13/20  5:05 AM  Result Value Ref Range   CRP 2.5 (H) <1.0 mg/dL    Comment: Performed at [facility], [address], [place], [place] [address]  Hemoglobin A1c     Status: Abnormal   Collection Time: 09/13/20  5:05 AM  Result Value Ref Range   Hgb A1c MFr Bld 7.9 (H) 4.8 - 5.6 %    Comment: (NOTE) Pre diabetes:          5.7%-6.4%  Diabetes:              >6.4%  Glycemic control for   <7.0% adults with diabetes    Mean Plasma Glucose 180.03 mg/dL    Comment: Performed at [facility], [address], [place], [place] [address]  Glucose, capillary     Status: Abnormal   Collection Time: 09/13/20  8:34 AM  Result Value Ref Range   Glucose-Capillary 156 (H) 70 - 99 mg/dL    Comment: Glucose reference range applies only to samples taken after fasting for at least 8 hours.  Glucose, capillary     Status: Abnormal   Collection Time: 09/13/20 12:08 PM  Result Value Ref Range   Glucose-Capillary 185 (H) 70 - 99 mg/dL    Comment: Glucose reference range applies only to samples taken after fasting for at least 8 hours.  Glucose, capillary     Status: Abnormal   Collection Time: 09/13/20  5:07 PM  Result Value Ref Range   Glucose-Capillary 340 (H) 70 - 99 mg/dL    Comment: Glucose reference range applies only to samples taken after fasting for at least 8 hours.  Glucose, capillary     Status: Abnormal   Collection Time: 09/13/20  9:14 PM  Result Value Ref Range   Glucose-Capillary 227 (H) 70 - 99 mg/dL    Comment: Glucose reference range applies only to samples taken after fasting for at least 8 hours.  Glucose, capillary     Status: Abnormal   Collection Time: 09/14/20  8:17 AM  Result Value Ref Range   Glucose-Capillary 151 (H) 70 - 99 mg/dL    Comment: Glucose reference range  applies only to samples taken after fasting for at least 8 hours.  Glucose, capillary     Status: Abnormal   Collection Time: 09/14/20 11:07 AM  Result Value Ref Range   Glucose-Capillary 201 (H) 70 - 99 mg/dL    Comment: Glucose reference range applies only to samples taken after fasting for at least 8 hours.  Basic metabolic panel - if new onset seizures     Status: Abnormal   Collection Time: 09/24/20 10:49 AM  Result Value Ref Range   Sodium 137 135 - 145 mmol/L   Potassium 3.4 (L) 3.5 - 5.1 mmol/L   Chloride 99 98 - 111 mmol/L   CO2 25 22 - 32 mmol/L   Glucose, Bld 150 (H) 70 - 99 mg/dL    Comment: Glucose reference range applies only to samples taken after fasting for at least 8 hours.   BUN 14 6 - 20 mg/dL   Creatinine, Ser 6.96 0.44 - 1.00 mg/dL   Calcium 9.8 8.9 - 29.5 mg/dL   GFR, Estimated >28 >41 mL/min    Comment: (NOTE) Calculated using the CKD-EPI Creatinine Equation (2021)    Anion gap 13 5 - 15    Comment: Performed at [facility], [address] [place], [place] [address]  CBC - if new onset seizures     Status: None   Collection Time: 09/24/20 10:49 AM  Result Value Ref Range   WBC 8.7 4.0 - 10.5 K/uL   RBC 4.55 3.87 - 5.11 MIL/uL   Hemoglobin 13.0 12.0 - 15.0 g/dL   HCT 10.2 72.5 - 36.6 %   MCV 89.5 80.0 - 100.0 fL   MCH 28.6 26.0 - 34.0 pg   MCHC 31.9 30.0 - 36.0 g/dL   RDW 44.0 34.7 - 42.5 %   Platelets 265 150 - 400 K/uL   nRBC 0.0 0.0 - 0.2 %    Comment: Performed at [facility], [address] [place], [place] [address]  Urinalysis, Complete w Microscopic     Status: Abnormal   Collection Time: 09/24/20  1:30 PM  Result Value Ref Range   Color, Urine STRAW (A) YELLOW   APPearance CLEAR (A) CLEAR   Specific Gravity, Urine 1.002 (L) 1.005 - 1.030   pH 7.0 5.0 - 8.0   Glucose, UA >=500 (A) NEGATIVE mg/dL   Hgb urine dipstick NEGATIVE NEGATIVE   Bilirubin Urine NEGATIVE NEGATIVE   Ketones, ur NEGATIVE NEGATIVE mg/dL    Protein, ur NEGATIVE NEGATIVE mg/dL   Nitrite NEGATIVE NEGATIVE   Leukocytes,Ua SMALL (A) NEGATIVE   RBC / HPF 0-5 0 - 5 RBC/hpf   WBC, UA 0-5 0 - 5 WBC/hpf  Bacteria, UA RARE (A) NONE SEEN   Squamous Epithelial / LPF 0-5 0 - 5    Comment: Performed at [facility], [address] [place], [place] [address]  Urine Drug Screen, Qualitative     Status: None   Collection Time: 09/24/20  1:30 PM  Result Value Ref Range   Tricyclic, Ur Screen NONE DETECTED NONE DETECTED   Amphetamines, Ur Screen NONE DETECTED NONE DETECTED   MDMA (Ecstasy)Ur Screen NONE DETECTED NONE DETECTED   Cocaine Metabolite,Ur [place] NONE DETECTED NONE DETECTED   Opiate, Ur Screen NONE DETECTED NONE DETECTED   Phencyclidine (PCP) Ur S NONE DETECTED NONE DETECTED   Cannabinoid 50 Ng, Ur [place] NONE DETECTED NONE DETECTED   Barbiturates, Ur Screen NONE DETECTED NONE DETECTED   Benzodiazepine, Ur Scrn NONE DETECTED NONE DETECTED   Methadone Scn, Ur NONE DETECTED NONE DETECTED    Comment: (NOTE) Tricyclics + metabolites, urine    Cutoff 1000 ng/mL Amphetamines + metabolites, urine  Cutoff 1000 ng/mL MDMA (Ecstasy), urine              Cutoff 500 ng/mL Cocaine Metabolite, urine          Cutoff 300 ng/mL Opiate + metabolites, urine        Cutoff 300 ng/mL Phencyclidine (PCP), urine         Cutoff 25 ng/mL Cannabinoid, urine                 Cutoff 50 ng/mL Barbiturates + metabolites, urine  Cutoff 200 ng/mL Benzodiazepine, urine              Cutoff 200 ng/mL Methadone, urine                   Cutoff 300 ng/mL  The urine drug screen provides only a preliminary, unconfirmed analytical test result and should not be used for non-medical purposes. Clinical consideration and professional judgment should be applied to any positive drug screen result due to possible interfering substances. A more specific alternate chemical method must be used in order to obtain a confirmed analytical result. Gas chromatography / mass  spectrometry (GC/MS) is the preferred confirm atory method. Performed at [facility], [address] [place], [place] [address]     Radiology: DG Knee Complete 4 Views Right  Result Date: 11/13/2020 CLINICAL DATA:  Acute right knee pain. EXAM: RIGHT KNEE - COMPLETE 4+ VIEW COMPARISON:  May 28, 2015. FINDINGS: No evidence of fracture, dislocation, or joint effusion. Status post right medial hemiarthroplasty. Mild narrowing and osteophyte formation is seen involving the lateral joint space. Soft tissues are unremarkable. IMPRESSION: Status post right medial hemiarthroplasty. Mild degenerative joint disease is noted laterally. Electronically Signed   By: Lupita Raider M.D.   On: 11/13/2020 16:08    No results found.  DG Knee Complete 4 Views Right  Result Date: 11/13/2020 CLINICAL DATA:  Acute right knee pain. EXAM: RIGHT KNEE - COMPLETE 4+ VIEW COMPARISON:  May 28, 2015. FINDINGS: No evidence of fracture, dislocation, or joint effusion. Status post right medial hemiarthroplasty. Mild narrowing and osteophyte formation is seen involving the lateral joint space. Soft tissues are unremarkable. IMPRESSION: Status post right medial hemiarthroplasty. Mild degenerative joint disease is noted laterally. Electronically Signed   By: Lupita Raider M.D.   On: 11/13/2020 16:08      Assessment and Plan: Patient Active Problem List   Diagnosis Date Noted  . OSA (obstructive sleep apnea) 12/11/2020  . Seizure-like activity (  HCC) 10/18/2020  . COVID-19   . Pneumonia due to COVID-19 virus 09/12/2020  . Hypoglycemia 07/04/2020  . Acute renal failure (HCC) 07/04/2020  . Dehydration 07/04/2020  . COPD (chronic obstructive pulmonary disease) (HCC) 07/04/2020  . Chest pain 05/07/2020  . Hypotension 05/07/2020  . Recurrent syncope 05/07/2020  . Type 2 diabetes mellitus without complication (HCC) 05/07/2020  . Asthma 05/07/2020  . Chronic congestive heart failure (HCC)  05/07/2020  . Epilepsy (HCC) 01/12/2020  . Vitamin D deficiency 01/12/2020  . Class 2 severe obesity with serious comorbidity and body mass index (BMI) of 36.0 to 36.9 in adult [facility]) 01/12/2020  . Syncope 08/27/2019  . Syncope and collapse 08/26/2019  . Asthma 07/13/2019  . Hypotension 10/10/2018  . Acute on chronic diastolic (congestive) heart failure (HCC) 07/17/2018  . Acute on chronic heart failure (HCC) 07/08/2018  . Chronic diastolic heart failure (HCC) 04/17/2018  . Lymphedema 04/17/2018  . Chest pain 03/28/2018  . Hypertension 10/15/2017  . Wears dentures 10/15/2017  . Symptomatic cholelithiasis 04/02/2017  . Recurrent incisional hernia 03/17/2017  . Calculus of gallbladder without cholecystitis without obstruction 03/17/2017  . Helicobacter pylori gastritis 03/17/2017  . Depression 09/28/2016  . Diabetes mellitus, type 2 (HCC) 09/28/2016  . Acute respiratory failure with hypoxia (HCC) 03/18/2016  . Tremor 07/01/2013  . Esophageal reflux 06/22/2012  . Major depressive disorder, single episode 08/22/2009  . Sleep apnea 05/30/2009  . Hyperlipidemia 08/10/2008    1. OSA (obstructive sleep apnea) he patient did tolerate PAP and reported significant benefit from PAP use. Without it she sleeps poorly and is very sleepy. She also has a history of RLS which has been untreated. This may improve with CPAP so will follow up after she is back on  CPAP. She is on ferrous sulfate currently . Will order iron and ferritin studies.  She will need a new PSG as we do not have one on file  1. OSA- will order PSg and follow up 30+ days after set up with a new machine. 2. RLS will address at CPAP follow up    2. Chronic congestive heart failure, unspecified heart failure type (HCC) Ongoing, stable, follows with cardiology.   3. Chronic diastolic heart failure (HCC) Emphasized importance of treating heart failure given her sleep apnea and history of stroke.   4. Hypertension, unspecified  type Borderline control, she will monitor at home. Hypertension Counseling:   The following hypertensive lifestyle modification were recommended and discussed:  1. Limiting alcohol intake to less than 1 oz/day of ethanol:(24 oz of beer or 8 oz of wine or 2 oz of 100-proof whiskey). 2. Take baby ASA 81 mg daily. 3. Importance of regular aerobic exercise and losing weight. 4. Reduce dietary saturated fat and cholesterol intake for overall cardiovascular health. 5. Maintaining adequate dietary potassium, calcium, and magnesium intake. 6. Regular monitoring of the blood pressure. 7. Reduce sodium intake to less than 100 mmol/day (less than 2.3 gm of sodium or less than 6 gm of sodium choride)   5. Moderate persistent asthma without complication Stable, currently has minimal symptoms.   6. Gastroesophageal reflux disease without esophagitis Asymptomatic with use of omeprazole.   7. Chronic obstructive pulmonary disease, unspecified COPD type (HCC) Asymptomatic.   8. Iron deficiency Likely contributing to her RLS. Will get iron studies.  - Fe+TIBC+Fer  General Counseling: I have discussed the findings of the evaluation and examination with Driana.  I have also discussed any further diagnostic evaluation thatmay be needed or ordered  today. Joniyah verbalizes understanding of the findings of todays visit. We also reviewed her medications today and discussed drug interactions and side effects including but not limited excessive drowsiness and altered mental states. We also discussed that there is always a risk not just to her but also people around her. she has been encouraged to call the office with any questions or concerns that should arise related to todays visit.  Orders Placed This Encounter  Procedures  . Fe+TIBC+Fer        I have personally obtained a history, examined the patient, evaluated laboratory and imaging results, formulated the assessment and plan and placed orders.  This  patient was seen today by Emmaline Kluver, PA-C in collaboration with Dr. Freda Munro.  Valentino Hue Sol Blazing, PhD, FAASM  Diplomate, American Board of Sleep Medicine    Yevonne Pax, MD [facility] Diplomate ABMS Pulmonary and Critical Care Medicine Sleep medicine

## 2020-12-11 NOTE — Patient Instructions (Signed)

## 2020-12-14 ENCOUNTER — Other Ambulatory Visit: Payer: Self-pay

## 2020-12-15 LAB — IRON,TIBC AND FERRITIN PANEL
Ferritin: 34 ng/mL (ref 15–150)
Iron Saturation: 12 % — ABNORMAL LOW (ref 15–55)
Iron: 56 ug/dL (ref 27–159)
Total Iron Binding Capacity: 455 ug/dL — ABNORMAL HIGH (ref 250–450)
UIBC: 399 ug/dL (ref 131–425)

## 2020-12-29 DIAGNOSIS — M1711 Unilateral primary osteoarthritis, right knee: Secondary | ICD-10-CM | POA: Insufficient documentation

## 2021-01-10 ENCOUNTER — Ambulatory Visit: Payer: Medicare Other | Admitting: Gastroenterology

## 2021-01-16 ENCOUNTER — Other Ambulatory Visit: Payer: Self-pay | Admitting: *Deleted

## 2021-01-16 NOTE — Patient Outreach (Signed)
[facility]) Care Management  01/16/2021  Amanda [name] Jan 27, 1966 [id number]   Amanda [name] Unsuccessful outreach  Amanda [name] was referred to Amanda [name] on 07/04/20 by MD Referral Reason:Per notes on 07/04/20 patient reported she was unable to afford her medications most of the time Expensive medications identified are victoza, Jardinace, Breo,Latuda and Linzess Recurrent syncopal events related to medication 07/10/20 Amanda [name] CMA confirmed on APL  Insurance:[employer] Medicare and Medicaid [place] access Last admission/ED 11/13/20 ED visit with c/o migraine headache x 3 days Right knee pain x 3 weeks 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 [facility] RN CM left HIPAA Amanda [name] Portability and Accountability Act) compliant voicemail message along with CM's contact info.   Plan: [name] RN CM scheduled this patient for another call attempt within 4-7 business days  [name] L. Noelle Penner, RN, BSN, CCM [name] Telephonic Care Management Care Coordinator Office number [phone] Mobile number [phone]  Main [facility] number [phone] Fax number [phone]

## 2021-01-23 ENCOUNTER — Other Ambulatory Visit: Payer: Self-pay | Admitting: *Deleted

## 2021-01-23 NOTE — Patient Outreach (Signed)
[facility]) Care Management  01/23/2021  Amanda [name] 11/20/1965 [id number]  [facility] second unsuccessful outreach to complex care patient Amanda [name] was referred to [facility] on 07/04/20 by MD Referral Reason:Per notes on 07/04/20 patient reported she was unable to afford her medications most of the time Expensive medications identified are victoza, Jardinace, Breo,Latuda and Linzess Recurrent syncopal events related to medication 07/10/20 [facility] CMA confirmed on APL  Insurance:[employer] Medicare and Medicaid [place] access Last admission/ED 11/13/20 ED visit with c/o migraine headache x 3 days Right knee pain x 3 weeks PMH 2013 partial right kneereplacement 09/12/20-09/14/20 acute respiratory failure with hypoxia, pneumonia due to covid 19 virus, dehydration  07/19/20 ED for syncope, loss of consciousness, hypotension 07/03/20- 07/04/20 for recurrent syncope, depression, HTN, CHF  Last unsuccessful outreach on 01/16/21 Outreach attempt to the home number x 2  Line busy No answer. [facility] RN CM left a SMS message to include [facility] RN CM's office number as pt mail box was full   Plan: [facility] RN CM scheduled this patient for another call attempt within 4-7 business days [facility] unsuccessful outreach letter sent today 01/23/21  Cala Bradford L. Noelle Penner, RN, BSN, CCM [facility] Telephonic Care Management Care Coordinator Office number [phone] Mobile number [phone]  Main [facility] number [phone] Fax number [phone]

## 2021-01-31 ENCOUNTER — Other Ambulatory Visit: Payer: Self-pay | Admitting: *Deleted

## 2021-01-31 ENCOUNTER — Other Ambulatory Visit: Payer: Self-pay

## 2021-01-31 NOTE — Patient Outreach (Signed)
[facility]) Care Management  01/31/2021  Amanda [name] 1966-02-26 [id number]  [facility] outreach to complex care patient [name] was referred to [facility] on 07/04/20 by MD  Referral Reason: Per notes on 07/04/20 patient reported she was unable to afford her medications most of the time Expensive medications identified are victoza, Jardinace, Breo,Latuda and Linzess Recurrent syncopal events related to medication  In November 2021 [facility] pharmacist, Farrel Gordon worked with her to recommend one central provider and pharmacy plus changes in her medications as patient was taking medications she was informed to discontinue. Medication cost issues were primarily related to an annual beginning of the year deductible and medications not covered by her dual coverage.  Due to her dual eligibility she is ineligible for any drug manufacturer assistance program. 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 09/12/20-09/14/20 acute respiratory failure with hypoxia, pneumonia due to covid 19 virus, dehydration   07/19/20 ED for syncope, loss of consciousness, hypotension 07/03/20- 07/04/20 for recurrent syncope, depression, HTN, CHF   Patient is able to verify HIPAA [facility] Portability and Accountability Act) identifiers Reviewed and addressed the purpose of the follow up call with the patient  Consent: [facility] ([facility] Customer service manager) RN CM reviewed [facility] services with patient. Patient gave verbal consent for services.  Follow up assessment Amanda [name] reports today she is feeling better  She confirms today she is not having any further medication cost concern She reports presently she has obtain referrals to pain management, orthopedics, physical  therapy (PT)and is scheduled for an upcoming sleep study on February 01 2021 She is going to a Pain clinic and PT for right knee pain  She reports she prefers these services at this time vs right knee surgery She reports she is preferring not to get steroid injections but prefer nerve injections  Diabetes (DM) type 2  She reports her fasting cbg recently was 87  Her 09/13/20 HgA1c =7.9 She continues to be followed by Dr Earl Lites endocrinologist  Hypertension She denies hypotension episodes   Patient Active Problem List   Diagnosis Date Noted   Primary osteoarthritis of right knee 12/29/2020   OSA (obstructive sleep apnea) 12/11/2020   Seizure-like activity (HCC) 10/18/2020   COVID-19    Pneumonia due to COVID-19 virus 09/12/2020   Hypoglycemia 07/04/2020   Acute renal failure (HCC) 07/04/2020   Dehydration 07/04/2020   COPD (chronic obstructive pulmonary disease) (HCC) 07/04/2020   Chest pain 05/07/2020   Hypotension 05/07/2020   Recurrent syncope 05/07/2020   Type 2 diabetes mellitus without complication (HCC) 05/07/2020   Asthma 05/07/2020   Chronic congestive heart failure (HCC) 05/07/2020   Epilepsy (HCC) 01/12/2020   Vitamin D deficiency 01/12/2020   Class 2 severe obesity with serious comorbidity and body mass index (BMI) of 36.0 to 36.9 in adult [facility]) 01/12/2020   Syncope 08/27/2019   Syncope and collapse 08/26/2019   Asthma 07/13/2019   Hypotension 10/10/2018   Acute on chronic diastolic (congestive) heart failure (HCC) 07/17/2018   Acute on chronic heart failure (HCC) 07/08/2018   Chronic diastolic heart failure (HCC) 04/17/2018   Lymphedema 04/17/2018   Chest pain 03/28/2018   Hypertension 10/15/2017   Wears dentures 10/15/2017   Symptomatic cholelithiasis 04/02/2017  Recurrent incisional hernia 03/17/2017   Calculus of gallbladder without cholecystitis without obstruction 03/17/2017   Helicobacter pylori gastritis 03/17/2017   Depression 09/28/2016   Diabetes  mellitus, type 2 (HCC) 09/28/2016   Acute respiratory failure with hypoxia (HCC) 03/18/2016   Tremor 07/01/2013   Esophageal reflux 06/22/2012   Major depressive disorder, single episode 08/22/2009   Sleep apnea 05/30/2009   Hyperlipidemia 08/10/2008    Plan: [facility] RN CM  will refer pt to [facility] disease management services as discussed and she agreed to during this outreach Letters sent to pt and pcp  Goals Addressed               This Visit's Progress     Patient Stated     COMPLETED: [facility]) Find Help in My Community (pt-stated)   On track        - begin a notebook of services in my neighborhood or community - follow-up on any referrals for help I am given - have a back-up plan     Notes:   01/31/21 Goal completed  agrees to transfer to [facility] DM/health coach services  3/317/22 given [place] billing/pt financial services number after voiced concern with bill- Pt to follow up 10/16/20 scales received  08/01/20 Had [facility] CMA send pt scales and advance directive forms       ([facility]) Monitor and Manage My Blood Sugar-Diabetes Type 2 (pt-stated)   On track     Timeframe:  Long-Range Goal Priority:  Medium Start Date:               01/31/21              Expected End Date:                       Follow Up Date 03/02/21 or pending [facility] DM staff scheduled outreach     - check blood sugar at prescribed times     Notes:  Continues to monitor cbg at home Last A1c 7.9           Amanda [name] L. Noelle Penner, RN, BSN, CCM [facility] Telephonic Care Management Care Coordinator Office number [phone] Mobile number [phone]  Main [facility] number [phone] Fax number [phone]

## 2021-02-05 ENCOUNTER — Other Ambulatory Visit: Payer: Self-pay

## 2021-02-08 ENCOUNTER — Ambulatory Visit: Payer: Medicare Other | Admitting: Gastroenterology

## 2021-02-12 ENCOUNTER — Other Ambulatory Visit: Payer: Self-pay | Admitting: *Deleted

## 2021-02-20 ENCOUNTER — Other Ambulatory Visit: Payer: Self-pay

## 2021-02-20 ENCOUNTER — Encounter: Payer: Self-pay | Admitting: Internal Medicine

## 2021-02-20 ENCOUNTER — Telehealth: Payer: Self-pay

## 2021-02-20 ENCOUNTER — Ambulatory Visit (INDEPENDENT_AMBULATORY_CARE_PROVIDER_SITE_OTHER): Payer: Medicare Other | Admitting: Internal Medicine

## 2021-02-20 ENCOUNTER — Telehealth: Payer: Self-pay | Admitting: Internal Medicine

## 2021-02-20 DIAGNOSIS — G4733 Obstructive sleep apnea (adult) (pediatric): Secondary | ICD-10-CM

## 2021-02-20 DIAGNOSIS — J4540 Moderate persistent asthma, uncomplicated: Secondary | ICD-10-CM | POA: Diagnosis not present

## 2021-02-20 MED ORDER — FLUTICASONE FUROATE-VILANTEROL 200-25 MCG/INH IN AEPB: 1.0000 | INHALATION_SPRAY | RESPIRATORY_TRACT | 10 refills | Status: AC

## 2021-02-20 MED ORDER — SPIRIVA HANDIHALER 18 MCG IN CAPS: 18.0000 ug | ORAL_CAPSULE | Freq: Every day | RESPIRATORY_TRACT | 6 refills | Status: DC

## 2021-02-20 MED ORDER — ALBUTEROL SULFATE HFA 108 (90 BASE) MCG/ACT IN AERS: 2.0000 | INHALATION_SPRAY | Freq: Four times a day (QID) | RESPIRATORY_TRACT | 6 refills | Status: AC | PRN

## 2021-02-20 NOTE — Telephone Encounter (Signed)
Patient is scheduled for phone visit today at 2:00.  She stated that she received a call from our office this morning but no voicemail was left.  She is aware that Dr. Belia Heman will call her around 2:00 today.  Nothing further needed at this time.

## 2021-02-20 NOTE — Patient Instructions (Signed)
Patient to restart CPAP therapy Refilled all her inhaler medicines for her asthma Recommend weight loss

## 2021-02-20 NOTE — Progress Notes (Signed)
I connected with the patient by telephone enabled telemedicine visit and verified that I am speaking with the correct person using two identifiers.    I discussed the limitations, risks, security and privacy concerns of performing an evaluation and management service by telemedicine and the availability of in-person appointments. I also discussed with the patient that there may be a patient responsible charge related to this service. The patient expressed understanding and agreed to proceed.  PATIENT AGREES AND CONFIRMS -YES   Other persons participating in the visit and their role in the encounter: Patient, nursing  This visit type was conducted due to national recommendations for restrictions regarding the COVID-19 Pandemic (e.g. social distancing).  This format is felt to be most appropriate for this patient at this time.  All issues noted in this document were discussed and addressed.     PATIENT AT HOME PHYSICIAN IN OFFICE      Referring provider: Emogene Morgan, MD  TEST/EVENTS :  RAST test 04/11/2016 total IgE 9.  Mild allergy to cockroach.  07/28/2018: Review outside sleep study 07/09/2018, CPAP titration study, patient required CPAP at a pressure of 10.  Residual AHI 0.   CC Follow up OSA Follow up ASTHMA   HPI:  Patient has moderate persistent asthma Ran out of her meds She needs refills I have refilled all of her meds Breo Spiriva and albuterol  Patient states she is being retested for sleep apnea and getting her CPAP machine this Thursday   Developed symptoms of COVID 19 in July 2020 , did not seek medical attention.  Developed cough, congestion fever loss of taste and smell.  In August had worsening cough , confusion , dyspnea.  Was admitted to [facility] for hypoxia and delirium.   She gets short of breath with activity.  She denies any significant cough or wheezing.  Does use her DuoNeb on a regular basis.       Allergies  Allergen Reactions    Penicillins Anaphylaxis    Has patient had a PCN reaction causing immediate rash, facial/tongue/throat swelling, SOB or lightheadedness with hypotension: Yes Has patient had a PCN reaction causing severe rash involving mucus membranes or skin necrosis: No Has patient had a PCN reaction that required hospitalization No Has patient had a PCN reaction occurring within the last 10 years: No If all of the above answers are "NO", then may proceed with Cephalosporin use.  Has patient had a PCN reaction causing immediate rash, facial/tongue/throat swelling, SOB or lightheadedness with hypotension: Yes Has patient had a PCN reaction causing severe rash involving mucus membranes or skin necrosis: No Has patient had a PCN reaction that required hospitalization No Has patient had a PCN reaction occurring within the last 10 years: No If all of the above answers are "NO", then may proceed with Cephalosporin use. Other reaction(s): Other (See Comments) Severe/almost died Other reaction(s): ANAPHYLAXIS Has patient had a PCN reaction causing immediate rash, facial/tongue/throat swelling, SOB or lightheadedness with hypotension: Yes Has patient had a PCN reaction causing severe rash involving mucus membranes or skin necrosis: No Has patient had a PCN reaction that required hospitalization No Has patient had a PCN reaction occurring within the last 10 years: No If all of the above answers are "NO", then may proceed with Cephalosporin use. Other reaction(s): Other (See Comments) Severe/almost died Other reaction(s): ANAPHYLAXIS Has patient had a PCN reaction causing immediate rash, facial/tongue/throat swelling, SOB or lightheadedness with hypotension: Yes Has patient had a  PCN reaction causing severe rash involving mucus membranes or skin necrosis: No Has patient had a PCN reaction that required hospitalization No Has patient had a PCN reaction occurring within the last 10 years: No If all of the above  answers are "NO", then may proceed with Cephalospo... (TRUNCATED)   Penicillins Anaphylaxis   Ace Inhibitors Swelling   Ace Inhibitors Swelling    Immunization History  Administered Date(s) Administered   Hep A / Hep B 08/05/2019, 09/09/2019   Influenza Split 09/18/2010   Influenza,inj,Quad PF,6+ Mos 06/05/2016, 06/05/2017, 06/16/2018, 08/27/2019, 07/04/2020   Moderna Sars-Covid-2 Vaccination 05/02/2020, 06/02/2020   Pneumococcal Polysaccharide-23 09/18/2010, 03/20/2016, 06/05/2017    Past Medical History:  Diagnosis Date   ADD (attention deficit disorder)    Anxiety    Arthritis    knees, shoulder, upper back   Asthma    CFS (chronic fatigue syndrome)    Chewing difficulty    CHF (congestive heart failure) (HCC)    Chronic kidney disease    Constipation    Depression    Depression    Diabetes mellitus without complication (HCC)    Dyspnea    Environmental allergies    Fatty liver    Fibromyalgia    GERD (gastroesophageal reflux disease)    GI bleed 10/02/2018   Headache    migraines - 5x/mo   Hypertension    Hypokalemia 10/14/2017   Hypothyroidism    IBS (irritable bowel syndrome)    Joint pain    Lower extremity edema    Major depressive disorder, single episode    Migraine without aura and without status migrainosus, not intractable    Migraines    Motion sickness    ships   Osteoarthritis    Other specified disorders of thyroid    Sleep apnea    Stroke (HCC)    no residual deficits   Swallowing difficulty    Thyroid nodule    bilateral and goiter   Vitamin D deficiency    Wears contact lenses    Wears dentures    partial upper    Tobacco History: Social History   Tobacco Use  Smoking Status Never  Smokeless Tobacco Never   Counseling given: Not Answered   Outpatient Medications Prior to Visit  Medication Sig Dispense Refill   ACCU-CHEK GUIDE test strip      Ascorbic Acid (VITAMIN C CR) 1000 MG TBCR Take 1 tablet by mouth daily.     B-D UF  III MINI PEN NEEDLES 31G X 5 MM MISC Inject into the skin daily.     BD PEN NEEDLE MICRO U/F 32G X 6 MM MISC Inject into the skin.     cholecalciferol (VITAMIN D) 25 MCG (1000 UNIT) tablet Take 1,000 Units by mouth daily.     desvenlafaxine (PRISTIQ) 50 MG 24 hr tablet Take 50 mg by mouth daily.     dexmethylphenidate (FOCALIN XR) 20 MG 24 hr capsule Take 20 mg by mouth every morning.     DULoxetine (CYMBALTA) 30 MG capsule Take 30 mg by mouth daily.     estradiol (ESTRACE) 0.1 MG/GM vaginal cream      ferrous sulfate 324 MG TBEC Take 324 mg by mouth daily.     flunisolide (NASALIDE) 25 MCG/ACT (0.025%) SOLN Place into the nose.     furosemide (LASIX) 40 MG tablet Take 40 mg by mouth daily.     HUMALOG KWIKPEN 100 UNIT/ML KwikPen Inject into the skin.     hydrOXYzine (VISTARIL) 25  MG capsule Take 50-75 mg by mouth at bedtime.     INPEN 100-GRAY-LILLY DEVI as directed.     Insulin Pen Needle 31G X 6 MM MISC SMARTSIG:1 Needle SUB-Q 4 Times Daily     ipratropium-albuterol (DUONEB) 0.5-2.5 (3) MG/3ML SOLN Inhale into the lungs.     JARDIANCE 25 MG TABS tablet Take 25 mg by mouth daily.     lidocaine (LIDODERM) 5 % lidocaine 5 % topical patch  PLEASE SEE ATTACHED FOR DETAILED DIRECTIONS     LINZESS 290 MCG CAPS capsule Take 290 mcg by mouth daily.     liraglutide (VICTOZA) 18 MG/3ML SOPN Inject 0.5 mLs (3 mg total) into the skin daily. 5 pen 0   LORazepam (ATIVAN) 1 MG tablet Take by mouth.     losartan (COZAAR) 25 MG tablet Take 25 mg by mouth daily.     meloxicam (MOBIC) 15 MG tablet Take 1 tablet by mouth daily.     mirtazapine (REMERON SOL-TAB) 30 MG disintegrating tablet Take 30 mg by mouth at bedtime as needed (Nap).   2   mirtazapine (REMERON) 15 MG tablet Take 15 mg by mouth at bedtime.     montelukast (SINGULAIR) 10 MG tablet Take 10 mg by mouth daily.     nabumetone (RELAFEN) 500 MG tablet Take 1 tablet by mouth 2 (two) times daily.     NOVOLOG FLEXPEN 100 UNIT/ML FlexPen Inject 10  Units into the skin daily.     omeprazole (PRILOSEC) 40 MG capsule Take 40 mg by mouth daily.     potassium chloride SA (KLOR-CON) 20 MEQ tablet Take 20 mEq by mouth daily.     pravastatin (PRAVACHOL) 40 MG tablet Take 40 mg by mouth daily.     predniSONE (DELTASONE) 10 MG tablet Take 50 mg daily taper by 10 mg daily then stop 15 tablet 0   pregabalin (LYRICA) 150 MG capsule Take 150 mg by mouth 3 (three) times daily.     PROAIR RESPICLICK 108 (90 Base) MCG/ACT AEPB Inhale 2 puffs into the lungs every 4 (four) hours as needed.     propranolol ER (INDERAL LA) 80 MG 24 hr capsule Take 80 mg by mouth daily.     SUMAtriptan (IMITREX) 50 MG tablet Take 1 tablet (50 mg total) by mouth every 2 (two) hours as needed for migraine. May repeat in 2 hours if headache persists or recurs.  Not to exceed 4 pills in a 24-hour period.  If you find you need more medication and you have taken 4 pills, please go directly to the emergency room or call 911. 10 tablet 0   tiZANidine (ZANAFLEX) 4 MG tablet Take 4 mg by mouth every 8 (eight) hours as needed for muscle spasms.      topiramate (TOPAMAX) 100 MG tablet Take 100 mg by mouth 2 (two) times daily.     TRESIBA FLEXTOUCH 200 UNIT/ML FlexTouch Pen 72 units per day seems very high for you.  Please follow up with your outpatient provider for further adjustment.     triamcinolone ointment (KENALOG) 0.1 % Apply topically.     TRUEPLUS PEN NEEDLES 31G X 6 MM MISC      VRAYLAR 4.5 MG CAPS Take 1 capsule by mouth daily.     zolpidem (AMBIEN) 10 MG tablet Take 10 mg by mouth at bedtime.     No facility-administered medications prior to visit.     Review of Systems:  Gen:  Denies  fever, sweats, chills weight  loss  HEENT: Denies blurred vision, double vision, ear pain, eye pain, hearing loss, nose bleeds, sore throat Cardiac:  No dizziness, chest pain or heaviness, chest tightness,edema, No JVD Resp:   No cough, -sputum production, -shortness of breath,-wheezing,  -hemoptysis,  Gi: Denies swallowing difficulty, stomach pain, nausea or vomiting, diarrhea, constipation, bowel incontinence Gu:  Denies bladder incontinence, burning urine Ext:   Denies Joint pain, stiffness or swelling Skin: Denies  skin rash, easy bruising or bleeding or hives Endoc:  Denies polyuria, polydipsia , polyphagia or weight change Psych:   Denies depression, insomnia or hallucinations  Other:  All other systems negative         PFT Results Latest Ref Rng & Units 05/23/2016  FVC-Pre L 1.73  FVC-Predicted Pre % 58  FVC-Post L 2.24  FVC-Predicted Post % 75  Pre FEV1/FVC % % 91  Post FEV1/FCV % % 92  FEV1-Pre L 1.57  FEV1-Predicted Pre % 65  FEV1-Post L 2.07  DLCO uncorrected ml/min/mmHg 22.02  DLCO UNC% % 85  DLVA Predicted % 142        Assessment & Plan:   Asthma Moderate to severe persistent asthma-not controlled at this time Refilled all her meds Continue Breo Spiriva and albuterol as needed   Regarding OSA.  Restart CPAP therapy   Obesity -recommend significant weight loss -recommend changing diet  Deconditioned state -Recommend increased daily activity and exercise    Chronic diastolic heart failure [facility]) Follow-up cardiology   MEDICATION ADJUSTMENTS/LABS AND TESTS ORDERED: Refilled all her medicines Start CPAP therapy  CURRENT MEDICATIONS REVIEWED AT LENGTH WITH PATIENT TODAY    Follow up 3 months   Total time spent 18 minutes  Ronnie Mallette Santiago Glad, M.D.  [facility]  Medical Director [facility] Medical Director [facility] Cardio-Pulmonary Department

## 2021-02-22 ENCOUNTER — Other Ambulatory Visit: Payer: Self-pay

## 2021-02-22 ENCOUNTER — Inpatient Hospital Stay: Payer: Medicare Other

## 2021-02-22 ENCOUNTER — Inpatient Hospital Stay: Payer: Medicare Other | Attending: Oncology | Admitting: Oncology

## 2021-02-22 ENCOUNTER — Encounter: Payer: Self-pay | Admitting: Oncology

## 2021-02-22 VITALS — BP 128/89 | HR 82 | Temp 97.8°F | Resp 16 | Wt 211.0 lb

## 2021-02-22 DIAGNOSIS — E039 Hypothyroidism, unspecified: Secondary | ICD-10-CM | POA: Diagnosis not present

## 2021-02-22 DIAGNOSIS — Z79899 Other long term (current) drug therapy: Secondary | ICD-10-CM | POA: Diagnosis not present

## 2021-02-22 DIAGNOSIS — M1990 Unspecified osteoarthritis, unspecified site: Secondary | ICD-10-CM | POA: Diagnosis not present

## 2021-02-22 DIAGNOSIS — I509 Heart failure, unspecified: Secondary | ICD-10-CM | POA: Diagnosis not present

## 2021-02-22 DIAGNOSIS — N189 Chronic kidney disease, unspecified: Secondary | ICD-10-CM | POA: Diagnosis not present

## 2021-02-22 DIAGNOSIS — I129 Hypertensive chronic kidney disease with stage 1 through stage 4 chronic kidney disease, or unspecified chronic kidney disease: Secondary | ICD-10-CM | POA: Diagnosis not present

## 2021-02-22 DIAGNOSIS — E119 Type 2 diabetes mellitus without complications: Secondary | ICD-10-CM | POA: Insufficient documentation

## 2021-02-22 DIAGNOSIS — J449 Chronic obstructive pulmonary disease, unspecified: Secondary | ICD-10-CM | POA: Insufficient documentation

## 2021-02-22 DIAGNOSIS — G40909 Epilepsy, unspecified, not intractable, without status epilepticus: Secondary | ICD-10-CM | POA: Insufficient documentation

## 2021-02-22 DIAGNOSIS — G4730 Sleep apnea, unspecified: Secondary | ICD-10-CM | POA: Insufficient documentation

## 2021-02-22 DIAGNOSIS — E559 Vitamin D deficiency, unspecified: Secondary | ICD-10-CM | POA: Insufficient documentation

## 2021-02-22 DIAGNOSIS — D509 Iron deficiency anemia, unspecified: Secondary | ICD-10-CM | POA: Insufficient documentation

## 2021-02-22 LAB — COMPREHENSIVE METABOLIC PANEL
ALT: 30 U/L (ref 0–44)
AST: 22 U/L (ref 15–41)
Albumin: 4.1 g/dL (ref 3.5–5.0)
Alkaline Phosphatase: 126 U/L (ref 38–126)
Anion gap: 4 — ABNORMAL LOW (ref 5–15)
BUN: 15 mg/dL (ref 6–20)
CO2: 28 mmol/L (ref 22–32)
Calcium: 9.4 mg/dL (ref 8.9–10.3)
Chloride: 108 mmol/L (ref 98–111)
Creatinine, Ser: 0.98 mg/dL (ref 0.44–1.00)
GFR, Estimated: >60 mL/min (ref >60)
Glucose, Bld: 115 mg/dL — ABNORMAL HIGH (ref 70–99)
Potassium: 4 mmol/L (ref 3.5–5.1)
Sodium: 140 mmol/L (ref 135–145)
Total Bilirubin: 0.4 mg/dL (ref 0.3–1.2)
Total Protein: 7.8 g/dL (ref 6.5–8.1)

## 2021-02-22 LAB — IRON AND TIBC
Iron: 46 ug/dL (ref 28–170)
Saturation Ratios: 11 % (ref 10.4–31.8)
TIBC: 431 ug/dL (ref 250–450)
UIBC: 385 ug/dL

## 2021-02-22 LAB — CBC WITH DIFFERENTIAL/PLATELET
Abs Immature Granulocytes: 0.02 10*3/uL (ref 0.00–0.07)
Basophils Absolute: 0 10*3/uL (ref 0.0–0.1)
Basophils Relative: 1 %
Eosinophils Absolute: 0 10*3/uL (ref 0.0–0.5)
Eosinophils Relative: 1 %
HCT: 43.5 % (ref 36.0–46.0)
Hemoglobin: 14 g/dL (ref 12.0–15.0)
Immature Granulocytes: 0 %
Lymphocytes Relative: 30 %
Lymphs Abs: 2.2 10*3/uL (ref 0.7–4.0)
MCH: 28.6 pg (ref 26.0–34.0)
MCHC: 32.2 g/dL (ref 30.0–36.0)
MCV: 88.8 fL (ref 80.0–100.0)
Monocytes Absolute: 0.5 10*3/uL (ref 0.1–1.0)
Monocytes Relative: 7 %
Neutro Abs: 4.5 10*3/uL (ref 1.7–7.7)
Neutrophils Relative %: 61 %
Platelets: 319 10*3/uL (ref 150–400)
RBC: 4.9 MIL/uL (ref 3.87–5.11)
RDW: 15.2 % (ref 11.5–15.5)
WBC: 7.3 10*3/uL (ref 4.0–10.5)
nRBC: 0 % (ref 0.0–0.2)

## 2021-02-22 LAB — FERRITIN: Ferritin: 38 ng/mL (ref 11–307)

## 2021-02-22 LAB — TECHNOLOGIST SMEAR REVIEW: Plt Morphology: NORMAL

## 2021-02-22 LAB — VITAMIN B12: Vitamin B-12: 443 pg/mL (ref 180–914)

## 2021-02-22 LAB — FOLATE: Folate: 25 ng/mL (ref >5.9)

## 2021-02-22 NOTE — Progress Notes (Signed)
CONSULT NOTE  Patient Care Team: Emogene Morgan, MD as PCP - General (Family Medicine) Delma Freeze, FNP as Nurse Practitioner (Family Medicine) Laurier Nancy, MD as Consulting Physician (Cardiology) Andee Poles, [facility] as Pharmacist (Pharmacist) Pleasant, Dennard Schaumann, RN as [facility] Care Management  CHIEF COMPLAINTS/PURPOSE OF CONSULTATION:  Anemia   HISTORY OF PRESENTING ILLNESS:  Amanda [name] 55 y.o. female is here because of low ferritin levels.  Her hemoglobin has been normal- baseline is around 12.5-13.  Most recent ferritin is from 12/14/2020 and was 34, TIBC 455 and iron saturation 12%.  She has a past medical history significant for hypertension, heart failure, sleep apnea, asthma, COPD, OSA, epilepsy, type 2 diabetes, osteoarthritis, acute renal failure and depression.  Patient is followed by Dr. Belia Heman for asthma and OSA.  Currently on Breo, Spiriva and albuterol.  She has a CPAP machine she uses nightly.  She had a colonoscopy in 2020 which was negative and a EGD/colonoscopy back in 2018 which showed gastritis, normal esophagus and duodenum.  She had a few internal hemorrhoids and 1 polyp found in the sigmoid colon.  Reports overall feeling fatigued and poorly.  Has ice pica prompting evaluation.  She has tried oral iron in liquid and tablet with poor toleration.  Both caused severe abdominal cramping, diarrhea nausea and vomiting.  Reports no active bleeding and no longer has menstrual cycles.  Reports 2 C-sections and hernia repair surgery with scar tissue.  Eats a fairly normal diet.  Denies a family history of hematology blood disorders.  Reports that her daughter is also iron deficient.  She gets iron infusions here at the cancer center.  MEDICAL HISTORY:  Past Medical History:  Diagnosis Date   ADD (attention deficit disorder)    Anxiety    Arthritis    knees, shoulder, upper back   Asthma    CFS (chronic fatigue syndrome)    Chewing  difficulty    CHF (congestive heart failure) (HCC)    Chronic kidney disease    Constipation    Depression    Depression    Diabetes mellitus without complication (HCC)    Dyspnea    Environmental allergies    Fatty liver    Fibromyalgia    GERD (gastroesophageal reflux disease)    GI bleed 10/02/2018   Headache    migraines - 5x/mo   Hypertension    Hypokalemia 10/14/2017   Hypothyroidism    IBS (irritable bowel syndrome)    Joint pain    Lower extremity edema    Major depressive disorder, single episode    Migraine without aura and without status migrainosus, not intractable    Migraines    Motion sickness    ships   Osteoarthritis    Other specified disorders of thyroid    Sleep apnea    Stroke (HCC)    no residual deficits   Swallowing difficulty    Thyroid nodule    bilateral and goiter   Vitamin D deficiency    Wears contact lenses    Wears dentures    partial upper    SURGICAL HISTORY: Past Surgical History:  Procedure Laterality Date   ABDOMINAL HYSTERECTOMY  2000's   ABDOMINAL HYSTERECTOMY     ABDOMINAL SURGERY     laparoscopy x4 with lysis of adhesions   APPENDECTOMY  1986   CESAREAN SECTION  1992   CHOLECYSTECTOMY N/A 04/03/2017   Procedure: LAPAROSCOPIC CHOLECYSTECTOMY;  Surgeon: Henrene Dodge, MD;  Location: [facility]  ORS;  Service: General;  Laterality: N/A;   COLONOSCOPY N/A 10/06/2018   Procedure: COLONOSCOPY;  Surgeon: Toney Reil, MD;  Location: [facility] ENDOSCOPY;  Service: Gastroenterology;  Laterality: N/A;   COLONOSCOPY WITH PROPOFOL N/A 03/10/2017   Procedure: COLONOSCOPY WITH PROPOFOL;  Surgeon: Scot Jun, MD;  Location: [facility] ENDOSCOPY;  Service: Endoscopy;  Laterality: N/A;   ECTOPIC PREGNANCY SURGERY  2000's   ESOPHAGOGASTRODUODENOSCOPY (EGD) WITH PROPOFOL N/A 03/10/2017   Procedure: ESOPHAGOGASTRODUODENOSCOPY (EGD) WITH PROPOFOL;  Surgeon: Scot Jun, MD;  Location: [facility] ENDOSCOPY;  Service: Endoscopy;  Laterality: N/A;   HERNIA  REPAIR     JOINT REPLACEMENT Right 12/16/12   knee- medial - makoplasty   KNEE ARTHROSCOPY Right 2006   partial medial and lateral meniscectomies   KNEE ARTHROSCOPY Right 10/06/2015   Procedure: RIGHT KNEE ARTHROSCOPY WITH DEBRIDEMENT;  Surgeon: Erin Sons, MD;  Location: [facility];  Service: Orthopedics;  Laterality: Right;  Diabetic - insulin and oral meds   ROTATOR CUFF REPAIR Left 2006   TUBAL LIGATION  2000's    SOCIAL HISTORY: Social History   Socioeconomic History   Marital status: Widowed    Spouse name: Kalecia Hartney   Number of children: Not on file   Years of education: college   Highest education level: Not on file  Occupational History   Occupation: Accountant  Tobacco Use   Smoking status: Never   Smokeless tobacco: Never  Vaping Use   Vaping Use: Never used  Substance and Sexual Activity   Alcohol use: Not Currently   Drug use: Never   Sexual activity: Yes    Birth control/protection: Surgical  Other Topics Concern   Not on file  Social History Narrative   ** Merged History Encounter **       Lives at home with son (who is mentally ill)   Ambulates independently.   Husband Dorinda Hill passed in June 2021   Daughter TT   Social Determinants of Health   Financial Resource Strain: Low Risk    Difficulty of Paying Living Expenses: Not very hard  Food Insecurity: No Food Insecurity   Worried About Programme researcher, broadcasting/film/video in the Last Year: Never true   Barista in the Last Year: Never true  Transportation Needs: No Transportation Needs   Lack of Transportation (Medical): No   Lack of Transportation (Non-Medical): No  Physical Activity: Not on file  Stress: No Stress Concern Present   Feeling of Stress : Only a little  Social Connections: Moderately Integrated   Frequency of Communication with Friends and Family: More than three times a week   Frequency of Social Gatherings with Friends and Family: More than three times a week   Attends  Religious Services: 1 to 4 times per year   Active Member of [employer] or Organizations: Yes   Attends Banker Meetings: 1 to 4 times per year   Marital Status: Widowed  Catering manager Violence: Not At Risk   Fear of Current or Ex-Partner: No   Emotionally Abused: No   Physically Abused: No   Sexually Abused: No    FAMILY HISTORY: Family History  Problem Relation Age of Onset   Hypertension Mother    Diabetes Mother    Heart disease Mother    Depression Mother    Obesity Mother    Hypertension Father    Diabetes Father    Allergies Father    Kidney disease Father    Cancer Father  Breast cancer Neg Hx     ALLERGIES:  is allergic to penicillins, penicillins, ace inhibitors, and ace inhibitors.  MEDICATIONS:  Current Outpatient Medications  Medication Sig Dispense Refill   ACCU-CHEK GUIDE test strip      albuterol (VENTOLIN HFA) 108 (90 Base) MCG/ACT inhaler Inhale 2 puffs into the lungs every 6 (six) hours as needed for wheezing or shortness of breath. 8 g 6   Ascorbic Acid (VITAMIN C CR) 1000 MG TBCR Take 1 tablet by mouth daily.     B-D UF III MINI PEN NEEDLES 31G X 5 MM MISC Inject into the skin daily.     BD PEN NEEDLE MICRO U/F 32G X 6 MM MISC Inject into the skin.     cholecalciferol (VITAMIN D) 25 MCG (1000 UNIT) tablet Take 1,000 Units by mouth daily.     desvenlafaxine (PRISTIQ) 50 MG 24 hr tablet Take 50 mg by mouth daily.     dexmethylphenidate (FOCALIN XR) 20 MG 24 hr capsule Take 20 mg by mouth every morning.     estradiol (ESTRACE) 0.1 MG/GM vaginal cream      furosemide (LASIX) 40 MG tablet Take 40 mg by mouth daily.     hydrOXYzine (VISTARIL) 25 MG capsule Take 50-75 mg by mouth at bedtime.     INPEN 100-GRAY-LILLY DEVI as directed.     Insulin Pen Needle 31G X 6 MM MISC SMARTSIG:1 Needle SUB-Q 4 Times Daily     ipratropium-albuterol (DUONEB) 0.5-2.5 (3) MG/3ML SOLN Inhale into the lungs.     JARDIANCE 25 MG TABS tablet Take 25 mg by mouth  daily.     lidocaine (LIDODERM) 5 % lidocaine 5 % topical patch  PLEASE SEE ATTACHED FOR DETAILED DIRECTIONS     LINZESS 290 MCG CAPS capsule Take 290 mcg by mouth daily.     liraglutide (VICTOZA) 18 MG/3ML SOPN Inject 0.5 mLs (3 mg total) into the skin daily. 5 pen 0   LORazepam (ATIVAN) 1 MG tablet Take by mouth.     losartan (COZAAR) 25 MG tablet Take 25 mg by mouth daily.     mirtazapine (REMERON) 15 MG tablet Take 30 mg by mouth at bedtime.     montelukast (SINGULAIR) 10 MG tablet Take 10 mg by mouth daily.     nabumetone (RELAFEN) 500 MG tablet Take 1 tablet by mouth 2 (two) times daily.     NOVOLOG FLEXPEN 100 UNIT/ML FlexPen Inject 10 Units into the skin daily.     omeprazole (PRILOSEC) 40 MG capsule Take 40 mg by mouth daily.     potassium chloride SA (KLOR-CON) 20 MEQ tablet Take 20 mEq by mouth daily.     pravastatin (PRAVACHOL) 40 MG tablet Take 40 mg by mouth daily.     pregabalin (LYRICA) 150 MG capsule Take 150 mg by mouth 3 (three) times daily.     PROAIR RESPICLICK 108 (90 Base) MCG/ACT AEPB Inhale 2 puffs into the lungs every 4 (four) hours as needed.     propranolol ER (INDERAL LA) 80 MG 24 hr capsule Take 80 mg by mouth daily.     SUMAtriptan (IMITREX) 50 MG tablet Take 1 tablet (50 mg total) by mouth every 2 (two) hours as needed for migraine. May repeat in 2 hours if headache persists or recurs.  Not to exceed 4 pills in a 24-hour period.  If you find you need more medication and you have taken 4 pills, please go directly to the emergency room or call  911. 10 tablet 0   tiotropium (SPIRIVA HANDIHALER) 18 MCG inhalation capsule Place 1 capsule (18 mcg total) into inhaler and inhale daily. 30 capsule 6   tiZANidine (ZANAFLEX) 4 MG tablet Take 4 mg by mouth every 8 (eight) hours as needed for muscle spasms.      topiramate (TOPAMAX) 100 MG tablet Take 100 mg by mouth 2 (two) times daily.     TRESIBA FLEXTOUCH 200 UNIT/ML FlexTouch Pen 72 units per day seems very high for you.   Please follow up with your outpatient provider for further adjustment.     triamcinolone ointment (KENALOG) 0.1 % Apply topically.     TRUEPLUS PEN NEEDLES 31G X 6 MM MISC      VRAYLAR 4.5 MG CAPS Take 1 capsule by mouth daily.     zolpidem (AMBIEN) 10 MG tablet Take 10 mg by mouth at bedtime.     ferrous sulfate 324 MG TBEC Take 324 mg by mouth daily. (Patient not taking: Reported on 02/22/2021)     flunisolide (NASALIDE) 25 MCG/ACT (0.025%) SOLN Place into the nose. (Patient not taking: Reported on 02/22/2021)     meloxicam (MOBIC) 15 MG tablet Take 1 tablet by mouth daily. (Patient not taking: Reported on 02/22/2021)     mirtazapine (REMERON SOL-TAB) 30 MG disintegrating tablet Take 30 mg by mouth at bedtime as needed (Nap).   2   predniSONE (DELTASONE) 10 MG tablet Take 50 mg daily taper by 10 mg daily then stop (Patient not taking: Reported on 02/22/2021) 15 tablet 0   No current facility-administered medications for this visit.    REVIEW OF SYSTEMS:   Review of Systems  Constitutional:  Positive for malaise/fatigue. Negative for chills, fever and weight loss.  HENT:  Negative for congestion, ear pain and tinnitus.   Eyes: Negative.  Negative for blurred vision and double vision.  Respiratory: Negative.  Negative for cough, sputum production and shortness of breath.   Cardiovascular: Negative.  Negative for chest pain, palpitations and leg swelling.  Gastrointestinal: Negative.  Negative for abdominal pain, constipation, diarrhea, nausea and vomiting.  Genitourinary:  Negative for dysuria, frequency and urgency.  Musculoskeletal:  Negative for back pain and falls.  Skin: Negative.  Negative for rash.  Neurological: Negative.  Negative for weakness and headaches.  Endo/Heme/Allergies: Negative.  Does not bruise/bleed easily.  Psychiatric/Behavioral: Negative.  Negative for depression. The patient is not nervous/anxious and does not have insomnia.    PHYSICAL EXAMINATION: ECOG  PERFORMANCE STATUS: 1 - Symptomatic but completely ambulatory  There were no vitals filed for this visit. Filed Weights   02/22/21 1405  Weight: 211 lb (95.7 kg)    Physical Exam Constitutional:      Appearance: Normal appearance.  HENT:     Head: Normocephalic and atraumatic.  Eyes:     Pupils: Pupils are equal, round, and reactive to light.  Cardiovascular:     Rate and Rhythm: Normal rate and regular rhythm.     Heart sounds: Normal heart sounds. No murmur heard. Pulmonary:     Effort: Pulmonary effort is normal.     Breath sounds: Normal breath sounds. No wheezing.  Abdominal:     General: Bowel sounds are normal. There is no distension.     Palpations: Abdomen is soft.     Tenderness: There is no abdominal tenderness.  Musculoskeletal:        General: Normal range of motion.     Cervical back: Normal range of motion.  Skin:  General: Skin is warm and dry.     Findings: No rash.  Neurological:     Mental Status: She is alert and oriented to person, place, and time.  Psychiatric:        Judgment: Judgment normal.    LABORATORY DATA:  I have reviewed the data as listed Recent Results (from the past 2160 hour(s))  Fe+TIBC+Fer     Status: Abnormal   Collection Time: 12/14/20  1:57 PM  Result Value Ref Range   Total Iron Binding Capacity 455 (H) 250 - 450 ug/dL   UIBC 720 721 - 828 ug/dL   Iron 56 27 - 833 ug/dL   Iron Saturation 12 (L) 15 - 55 %   Ferritin 34 15 - 150 ng/mL    RADIOGRAPHIC STUDIES: I have personally reviewed the radiological images as listed and agreed with the findings in the report. No results found.  ASSESSMENT:  Low iron levels: Discovered recently due to significant fatigue and ice pica. Reports longstanding history of iron deficiency although she is not anemic. Unable to tolerate oral iron tablets. Reports her daughter is also iron deficient. Denies any recent bleeding. Had colonoscopy in 2018 which showed gastritis and repeat in  2020 which was negative. Labs from 12/14/2020 show ferritin of 34 and iron saturation 12%, TIBC 455 and hemoglobin was 13.   PLAN:  Low iron levels: Per patient this is a longstanding history. Etiology likely secondary to gastritis and malabsorption.  Denies any active bleeds.  Has follow-up with GI on 03/14/21.  We will recheck labs today including CBC, ferritin, iron studies, B12, copper and folate. We will go ahead and get her set up for 3 doses of IV Venofer beginning next week and have her return to clinic in approximately 12 weeks to repeat her lab work. Suspect she is not anemic secondary to underlying comorbidities such as OSA, obesity and asthma/copd.  She is compliant with her sleep apnea machine. She has follow-up with GI in July.   Disposition: Repeat labs today. RTC next week for 3 doses of IV Venofer. RTC in 12 weeks for repeat lab work, NP assessment and possible IV Venofer.  Addendum: Lab work shows a normal CMP, copper level, B12 level, peripheral smear and folate level.  CBC shows hemoglobin 14.0.  Ferritin is 38 and iron saturation is 11%.  Proceed with 3 doses of IV Venofer.  No problem-specific Assessment & Plan notes found for this encounter.  Greater than 50% was spent in counseling and coordination of care with this patient including but not limited to discussion of the relevant topics above (See A&P) including, but not limited to diagnosis and management of acute and chronic medical conditions.     Mauro Kaufmann, NP 02/22/21 2:26 PM

## 2021-02-22 NOTE — Telephone Encounter (Signed)
Created in error

## 2021-02-23 ENCOUNTER — Other Ambulatory Visit: Payer: Self-pay | Admitting: Oncology

## 2021-02-23 ENCOUNTER — Encounter: Payer: Self-pay | Admitting: Oncology

## 2021-02-23 DIAGNOSIS — D509 Iron deficiency anemia, unspecified: Secondary | ICD-10-CM

## 2021-02-23 HISTORY — DX: Iron deficiency anemia, unspecified: D50.9

## 2021-02-23 LAB — COPPER, SERUM: Copper: 147 ug/dL (ref 80–158)

## 2021-02-24 LAB — METHYLMALONIC ACID, SERUM: Methylmalonic Acid, Quantitative: 169 nmol/L (ref 0–378)

## 2021-02-26 ENCOUNTER — Inpatient Hospital Stay: Payer: Medicare Other

## 2021-02-26 ENCOUNTER — Encounter: Payer: Self-pay | Admitting: Oncology

## 2021-02-26 VITALS — BP 118/81 | HR 78 | Temp 97.2°F

## 2021-02-26 DIAGNOSIS — D509 Iron deficiency anemia, unspecified: Secondary | ICD-10-CM | POA: Diagnosis not present

## 2021-02-26 MED ORDER — IRON SUCROSE 20 MG/ML IV SOLN
200.0000 mg | Freq: Once | INTRAVENOUS | Status: AC
  Administered 2021-02-26: 200 mg via INTRAVENOUS
  Filled 2021-02-26: qty 10

## 2021-02-26 MED ORDER — SODIUM CHLORIDE 0.9 % IV SOLN: 200.0000 mg | Freq: Once | INTRAVENOUS | Status: DC

## 2021-02-26 MED ORDER — SODIUM CHLORIDE 0.9 % IV SOLN
Freq: Once | INTRAVENOUS | Status: AC
  Filled 2021-02-26: qty 250

## 2021-02-28 ENCOUNTER — Inpatient Hospital Stay: Payer: Medicare Other

## 2021-02-28 ENCOUNTER — Other Ambulatory Visit: Payer: Self-pay

## 2021-02-28 VITALS — BP 123/80 | HR 82 | Temp 97.7°F | Resp 18

## 2021-02-28 DIAGNOSIS — D509 Iron deficiency anemia, unspecified: Secondary | ICD-10-CM | POA: Diagnosis not present

## 2021-02-28 MED ORDER — SODIUM CHLORIDE 0.9 % IV SOLN
Freq: Once | INTRAVENOUS | Status: AC
  Filled 2021-02-28: qty 250

## 2021-02-28 MED ORDER — SODIUM CHLORIDE 0.9 % IV SOLN: 200.0000 mg | Freq: Once | INTRAVENOUS | Status: DC

## 2021-02-28 MED ORDER — IRON SUCROSE 20 MG/ML IV SOLN
200.0000 mg | Freq: Once | INTRAVENOUS | Status: AC
  Administered 2021-02-28: 200 mg via INTRAVENOUS
  Filled 2021-02-28: qty 10

## 2021-03-02 ENCOUNTER — Inpatient Hospital Stay: Payer: Medicare Other | Attending: Oncology

## 2021-03-02 VITALS — BP 100/68 | HR 87 | Temp 98.6°F | Resp 20

## 2021-03-02 DIAGNOSIS — D509 Iron deficiency anemia, unspecified: Secondary | ICD-10-CM | POA: Insufficient documentation

## 2021-03-02 DIAGNOSIS — Z79899 Other long term (current) drug therapy: Secondary | ICD-10-CM | POA: Insufficient documentation

## 2021-03-02 MED ORDER — IRON SUCROSE 20 MG/ML IV SOLN
200.0000 mg | Freq: Once | INTRAVENOUS | Status: AC
  Administered 2021-03-02: 200 mg via INTRAVENOUS
  Filled 2021-03-02: qty 10

## 2021-03-02 MED ORDER — SODIUM CHLORIDE 0.9 % IV SOLN
Freq: Once | INTRAVENOUS | Status: AC
Start: 2021-03-02 — End: 2021-03-02
  Filled 2021-03-02: qty 250

## 2021-03-02 MED ORDER — SODIUM CHLORIDE 0.9 % IV SOLN: 200.0000 mg | Freq: Once | INTRAVENOUS | Status: DC

## 2021-03-02 NOTE — Patient Instructions (Signed)

## 2021-03-09 ENCOUNTER — Other Ambulatory Visit: Payer: Self-pay | Admitting: *Deleted

## 2021-03-09 NOTE — Patient Outreach (Signed)
[facility]) Care Management  [name]  03/09/2021   Amanda [name] [birth year] [id number]  RN Health Coach received return telephone call from patient.  Hipaa compliance verified.Per patient her blood pressure is uncontrolled. Patient stated that if she doesn't take in at least 3 liters of fluid her blood pressure drops. RN discussed fluid intake and taking diuretics, weighing and monitoring lower extremity swelling. Per patient she does not have any swelling at this time. Patient stated that she checks her BP, weight and her blood sugar everyday. Per patient she needs a knee replacement. She has been referred to the pain clinic. Per patient she has had a recent sleep study and is waiting to get a CPAP. She misplaced CPAP during her move. She is receiving physical therapy hydro therapy.  Patient is awaiting to learn how to use her new Dexcom CBM. Patient has agreed to follow up outreach call.  Medications:  Outpatient Encounter Medications as of 03/09/2021  Medication Sig Note   ACCU-CHEK GUIDE test strip     albuterol (VENTOLIN HFA) 108 (90 Base) MCG/ACT inhaler Inhale 2 puffs into the lungs every 6 (six) hours as needed for wheezing or shortness of breath.    Ascorbic Acid (VITAMIN C CR) 1000 MG TBCR Take 1 tablet by mouth daily.    B-D UF III MINI PEN NEEDLES 31G X 5 MM MISC Inject into the skin daily.    BD PEN NEEDLE MICRO U/F 32G X 6 MM MISC Inject into the skin.    cholecalciferol (VITAMIN D) 25 MCG (1000 UNIT) tablet Take 1,000 Units by mouth daily.    desvenlafaxine (PRISTIQ) 50 MG 24 hr tablet Take 50 mg by mouth daily.    dexmethylphenidate (FOCALIN XR) 20 MG 24 hr capsule Take 20 mg by mouth every morning.    estradiol (ESTRACE) 0.1 MG/GM vaginal cream  11/13/2020: prn   ferrous sulfate 324 MG TBEC Take 324 mg by mouth daily. (Patient not taking: Reported on 02/22/2021)    flunisolide (NASALIDE) 25 MCG/ACT (0.025%) SOLN Place into the nose. (Patient not taking:  Reported on 02/22/2021) 08/15/2020: For sinsus infection pt takes prn Has not taken in a while   furosemide (LASIX) 40 MG tablet Take 40 mg by mouth daily.    hydrOXYzine (VISTARIL) 25 MG capsule Take 50-75 mg by mouth at bedtime.    INPEN 100-GRAY-LILLY DEVI as directed.    Insulin Pen Needle 31G X 6 MM MISC SMARTSIG:1 Needle SUB-Q 4 Times Daily    ipratropium-albuterol (DUONEB) 0.5-2.5 (3) MG/3ML SOLN Inhale into the lungs.    JARDIANCE 25 MG TABS tablet Take 25 mg by mouth daily.    lidocaine (LIDODERM) 5 % lidocaine 5 % topical patch  PLEASE SEE ATTACHED FOR DETAILED DIRECTIONS 08/15/2020: Prn 12 hours on 12 hours off   LINZESS 290 MCG CAPS capsule Take 290 mcg by mouth daily.    liraglutide (VICTOZA) 18 MG/3ML SOPN Inject 0.5 mLs (3 mg total) into the skin daily.    LORazepam (ATIVAN) 1 MG tablet Take by mouth.    losartan (COZAAR) 25 MG tablet Take 25 mg by mouth daily.    meloxicam (MOBIC) 15 MG tablet Take 1 tablet by mouth daily. (Patient not taking: Reported on 02/22/2021)    mirtazapine (REMERON SOL-TAB) 30 MG disintegrating tablet Take 30 mg by mouth at bedtime as needed (Nap).     mirtazapine (REMERON) 15 MG tablet Take 30 mg by mouth at bedtime.    montelukast (SINGULAIR) 10  MG tablet Take 10 mg by mouth daily. 08/15/2020: Take at hs   nabumetone (RELAFEN) 500 MG tablet Take 1 tablet by mouth 2 (two) times daily.    NOVOLOG FLEXPEN 100 UNIT/ML FlexPen Inject 10 Units into the skin daily. 11/13/2020: 10 units per meal   omeprazole (PRILOSEC) 40 MG capsule Take 40 mg by mouth daily.    potassium chloride SA (KLOR-CON) 20 MEQ tablet Take 20 mEq by mouth daily.    pravastatin (PRAVACHOL) 40 MG tablet Take 40 mg by mouth daily.    predniSONE (DELTASONE) 10 MG tablet Take 50 mg daily taper by 10 mg daily then stop (Patient not taking: Reported on 02/22/2021)    pregabalin (LYRICA) 150 MG capsule Take 150 mg by mouth 3 (three) times daily. 08/15/2020: Usually qd She only takes q hs    PROAIR RESPICLICK 657 (90 Base) MCG/ACT AEPB Inhale 2 puffs into the lungs every 4 (four) hours as needed.    propranolol ER (INDERAL LA) 80 MG 24 hr capsule Take 80 mg by mouth daily.    SUMAtriptan (IMITREX) 50 MG tablet Take 1 tablet (50 mg total) by mouth every 2 (two) hours as needed for migraine. May repeat in 2 hours if headache persists or recurs.  Not to exceed 4 pills in a 24-hour period.  If you find you need more medication and you have taken 4 pills, please go directly to the emergency room or call 911.    tiotropium (SPIRIVA HANDIHALER) 18 MCG inhalation capsule Place 1 capsule (18 mcg total) into inhaler and inhale daily.    tiZANidine (ZANAFLEX) 4 MG tablet Take 4 mg by mouth every 8 (eight) hours as needed for muscle spasms.  08/15/2020: fibromyalgia   topiramate (TOPAMAX) 100 MG tablet Take 100 mg by mouth 2 (two) times daily. 08/15/2020: Use for epilepsy   TRESIBA FLEXTOUCH 200 UNIT/ML FlexTouch Pen 72 units per day seems very high for you.  Please follow up with your outpatient provider for further adjustment. 08/15/2020: Takes 84 units per pt   triamcinolone ointment (KENALOG) 0.1 % Apply topically.    TRUEPLUS PEN NEEDLES 31G X 6 MM MISC     VRAYLAR 4.5 MG CAPS Take 1 capsule by mouth daily.    zolpidem (AMBIEN) 10 MG tablet Take 10 mg by mouth at bedtime. 08/15/2020: Prn    No facility-administered encounter medications on file as of 03/09/2021.    Functional Status:  In your present state of health, do you have any difficulty performing the following activities: 09/13/2020 09/13/2020  Hearing? - -  Vision? - -  Difficulty concentrating or making decisions? - -  Walking or climbing stairs? Y -  Dressing or bathing? Y -  Doing errands, shopping? - Y  Some recent data might be hidden    Fall/Depression Screening: Fall Risk  12/29/2019 09/02/2019 08/24/2019  Falls in the past year? 1 1 0  Number falls in past yr: 0 1 0  Injury with Fall? 1 1 0  Risk for fall due to :  History of fall(s) History of fall(s) -  Follow up Falls evaluation completed Falls evaluation completed Falls evaluation completed   PHQ 2/9 Scores 11/16/2020 10/16/2020 07/07/2020 10/19/2019 06/03/2018 05/25/2018 04/17/2018  PHQ - 2 Score 0 0 1 6 1 1 1   PHQ- 9 Score - - - 24 - - -    Assessment:   Care Plan  Goals Addressed  This Visit's Progress     (THN) Monitor and Manage My Blood Sugar Diabetes Type 2 (pt-stated)   On track     Timeframe:  Long-Range Goal Priority:  Medium Start Date:          [id number]            Expected End Date:      [id number]                 Follow Up Date [id number]   - check blood sugar at prescribed times    Why is this important?   Checking your blood sugar at home helps to keep it from getting very high or very low.  Writing the results in a diary or log helps the doctor know how to care for you.  Your blood sugar log should have the time, date and the results.  Also, write down the amount of insulin or other medicine that you take.  Other information, like what you ate, exercise done and how you were feeling, will also be helpful.     Notes:  [id number] Patient is awaiting her new meter a dexacom       (THN)Lifestyle Change-Hypertension   On track     Timeframe:  Long-Range Goal Priority:  Medium Start Date: [id number]                            Expected End Date:   62863817                    Follow Up Date 07/01/2021    - agree to work together to make changes - ask questions to understand - learn about high blood pressure    Why is this important?   The changes that you are asked to make may be hard to do.  This is especially true when the changes are life-long.  Knowing why it is important to you is the first step.  Working on the change with your family or support person helps you not feel alone.  Reward yourself and family or support person when goals are met. This can be an activity you choose like bowling, hiking,  biking, swimming or shooting hoops.     Notes:  [id number] RN encouraging patient to ask questions       (THN)Track and Manage My Blood Pressure (pt-stated)   On track     Timeframe:  Long-Range Goal Priority:  High Start Date:       [id number]                      Expected End Date:      29191660 Follow up [id number]                   - check blood pressure daily - choose a place to take my blood pressure (home, clinic or office, retail store)    Why is this important?   You won't feel high blood pressure, but it can still hurt your blood vessels.  High blood pressure can cause heart or kidney problems. It can also cause a stroke.  Making lifestyle changes like losing a little weight or eating less salt will help.  Checking your blood pressure at home and at different times of the day can help to control blood pressure.  If the doctor prescribes medicine remember to take it the way the doctor ordered.  Call the office if you cannot afford the medicine or if there are questions about it.     Notes: 10/16/20 reports BP improved WNL goal met [id number] patient blood pressure is uncontrolled              Plan:  Follow-up: Patient agrees to Care Plan and Follow-up. RN sent A Matter of Choice blood pressure control RN will follow up within the October RN sent educational information on Chair exercises  [place] [facility] [phone]

## 2021-03-09 NOTE — Patient Instructions (Signed)
Goals Addressed               This Visit's Progress     (THN) Monitor and Manage My Blood Sugar-Diabetes Type 2 (pt-stated)   On track     Timeframe:  Long-Range Goal Priority:  Medium Start Date:          07082022            Expected End Date:      12302022                 Follow Up Date 10302022   - check blood sugar at prescribed times    Why is this important?   Checking your blood sugar at home helps to keep it from getting very high or very low.  Writing the results in a diary or log helps the doctor know how to care for you.  Your blood sugar log should have the time, date and the results.  Also, write down the amount of insulin or other medicine that you take.  Other information, like what you ate, exercise done and how you were feeling, will also be helpful.     Notes:  07082022 Patient is awaiting her new meter a dexacom       (THN)Lifestyle Change-Hypertension   On track     Timeframe:  Long-Range Goal Priority:  Medium Start Date: 07082022                            Expected End Date:   12302022                    Follow Up Date 07/01/2021    - agree to work together to make changes - ask questions to understand - learn about high blood pressure    Why is this important?   The changes that you are asked to make may be hard to do.  This is especially true when the changes are life-long.  Knowing why it is important to you is the first step.  Working on the change with your family or support person helps you not feel alone.  Reward yourself and family or support person when goals are met. This can be an activity you choose like bowling, hiking, biking, swimming or shooting hoops.     Notes:  .[id number] RN encouraging patient to ask questions       (THN)Track and Manage My Blood Pressure (pt-stated)   On track     Timeframe:  Long-Range Goal Priority:  High Start Date:       07082022                      Expected End Date:      12302022 Follow up  10302022                   - check blood pressure daily - choose a place to take my blood pressure (home, clinic or office, retail store)    Why is this important?   You won't feel high blood pressure, but it can still hurt your blood vessels.  High blood pressure can cause heart or kidney problems. It can also cause a stroke.  Making lifestyle changes like losing a little weight or eating less salt will help.  Checking your blood pressure at home and at different times of the day can help to control blood pressure.    If the doctor prescribes medicine remember to take it the way the doctor ordered.  Call the office if you cannot afford the medicine or if there are questions about it.     Notes: 10/16/20 reports BP improved WNL goal met [id number] patient blood pressure is uncontrolled         

## 2021-03-09 NOTE — Patient Outreach (Signed)
[facility]) Care Management  03/09/2021  Amanda [name] June 30, 1966 [id number]   RN Health Coach attempted follow up outreach call to patient.  Patient was unavailable. HIPPA compliance voicemail message left with return callback number.  Plan: RN will call patient again within 30 days.  Gean Maidens BSN RN [facility] Care Management [phone]

## 2021-03-20 ENCOUNTER — Ambulatory Visit: Payer: Medicare Other | Admitting: Gastroenterology

## 2021-04-09 ENCOUNTER — Ambulatory Visit: Payer: Medicare Other | Admitting: Dermatology

## 2021-05-02 ENCOUNTER — Telehealth: Payer: Self-pay | Admitting: Family

## 2021-05-02 ENCOUNTER — Ambulatory Visit: Payer: Medicare Other | Admitting: Family

## 2021-05-02 NOTE — Progress Notes (Deleted)
Patient ID: Amanda [name], female    DOB: 29-Dec-1965, 55 y.o.   MRN: [id number]  HPI  Amanda [name] is a 55 y/o female with a history of asthma, DM, HTN, stroke, thyroid disease, GERD, fibromyalgia, migraines, obstructive sleep apnea and chronic heart failure.   Echo report from 05/09/20 reviewed and showed an EF of 50-55% without LVH. Had echo done 10/04/2018 but unable to view those results. Echo report from 03/29/18 reviewed and showed an EF of 55-60%.  Was in the ED 11/13/20 due to right knee pain where she was evaluated and released.   She presents today for a follow-up visit with a chief complaint of   Past Medical History:  Diagnosis Date   ADD (attention deficit disorder)    Anxiety    Arthritis    knees, shoulder, upper back   Asthma    CFS (chronic fatigue syndrome)    Chewing difficulty    CHF (congestive heart failure) (HCC)    Chronic kidney disease    Constipation    Depression    Depression    Diabetes mellitus without complication (HCC)    Dyspnea    Environmental allergies    Fatty liver    Fibromyalgia    GERD (gastroesophageal reflux disease)    GI bleed 10/02/2018   Headache    migraines - 5x/mo   Hypertension    Hypokalemia 10/14/2017   Hypothyroidism    IBS (irritable bowel syndrome)    IDA (iron deficiency anemia) 02/23/2021   Joint pain    Lower extremity edema    Major depressive disorder, single episode    Migraine without aura and without status migrainosus, not intractable    Migraines    Motion sickness    ships   Osteoarthritis    Other specified disorders of thyroid    Sleep apnea    Stroke (HCC)    no residual deficits   Swallowing difficulty    Thyroid nodule    bilateral and goiter   Vitamin D deficiency    Wears contact lenses    Wears dentures    partial upper   Past Surgical History:  Procedure Laterality Date   ABDOMINAL HYSTERECTOMY  2000's   ABDOMINAL HYSTERECTOMY     ABDOMINAL SURGERY     laparoscopy x4 with lysis of  adhesions   APPENDECTOMY  1986   CESAREAN SECTION  1992   CHOLECYSTECTOMY N/A 04/03/2017   Procedure: LAPAROSCOPIC CHOLECYSTECTOMY;  Surgeon: Henrene Dodge, MD;  Location: [facility] ORS;  Service: General;  Laterality: N/A;   COLONOSCOPY N/A 10/06/2018   Procedure: COLONOSCOPY;  Surgeon: Toney Reil, MD;  Location: [facility] ENDOSCOPY;  Service: Gastroenterology;  Laterality: N/A;   COLONOSCOPY WITH PROPOFOL N/A 03/10/2017   Procedure: COLONOSCOPY WITH PROPOFOL;  Surgeon: Scot Jun, MD;  Location[facility] ENDOSCOPY;  Service: Endoscopy;  Laterality: N/A;   ECTOPIC PREGNANCY SURGERY  2000's   ESOPHAGOGASTRODUODENOSCOPY (EGD) WITH PROPOFOL N/A 03/10/2017   Procedure: ESOPHAGOGASTRODUODENOSCOPY (EGD) WITH PROPOFOL;  Surgeon: Scot Jun, MD;  Location: [facility] ENDOSCOPY;  Service: Endoscopy;  Laterality: N/A;   HERNIA REPAIR     JOINT REPLACEMENT Right 12/16/12   knee- medial - makoplasty   KNEE ARTHROSCOPY Right 2006   partial medial and lateral meniscectomies   KNEE ARTHROSCOPY Right 10/06/2015   Procedure: RIGHT KNEE ARTHROSCOPY WITH DEBRIDEMENT;  Surgeon: Erin Sons, MD;  Location: [facility];  Service: Orthopedics;  Laterality: Right;  Diabetic - insulin and oral meds   ROTATOR CUFF  REPAIR Left 2006   TUBAL LIGATION  2000's   Family History  Problem Relation Age of Onset   Hypertension Mother    Diabetes Mother    Heart disease Mother    Depression Mother    Obesity Mother    Hypertension Father    Diabetes Father    Allergies Father    Kidney disease Father    Cancer Father    Breast cancer Neg Hx    Social History   Tobacco Use   Smoking status: Never   Smokeless tobacco: Never  Substance Use Topics   Alcohol use: Not Currently   Allergies  Allergen Reactions   Penicillins Anaphylaxis    Has patient had a PCN reaction causing immediate rash, facial/tongue/throat swelling, SOB or lightheadedness with hypotension: Yes Has patient had a PCN reaction causing  severe rash involving mucus membranes or skin necrosis: No Has patient had a PCN reaction that required hospitalization No Has patient had a PCN reaction occurring within the last 10 years: No If all of the above answers are "NO", then may proceed with Cephalosporin use.  Has patient had a PCN reaction causing immediate rash, facial/tongue/throat swelling, SOB or lightheadedness with hypotension: Yes Has patient had a PCN reaction causing severe rash involving mucus membranes or skin necrosis: No Has patient had a PCN reaction that required hospitalization No Has patient had a PCN reaction occurring within the last 10 years: No If all of the above answers are "NO", then may proceed with Cephalosporin use. Other reaction(s): Other (See Comments) Severe/almost died Other reaction(s): ANAPHYLAXIS Has patient had a PCN reaction causing immediate rash, facial/tongue/throat swelling, SOB or lightheadedness with hypotension: Yes Has patient had a PCN reaction causing severe rash involving mucus membranes or skin necrosis: No Has patient had a PCN reaction that required hospitalization No Has patient had a PCN reaction occurring within the last 10 years: No If all of the above answers are "NO", then may proceed with Cephalosporin use. Other reaction(s): Other (See Comments) Severe/almost died Other reaction(s): ANAPHYLAXIS Has patient had a PCN reaction causing immediate rash, facial/tongue/throat swelling, SOB or lightheadedness with hypotension: Yes Has patient had a PCN reaction causing severe rash involving mucus membranes or skin necrosis: No Has patient had a PCN reaction that required hospitalization No Has patient had a PCN reaction occurring within the last 10 years: No If all of the above answers are "NO", then may proceed with Cephalospo... (TRUNCATED)   Penicillins Anaphylaxis   Ace Inhibitors Swelling   Ace Inhibitors Swelling      Review of Systems  Constitutional:  Positive  for fatigue (with minimal exertion). Negative for appetite change.  HENT:  Negative for congestion and postnasal drip.   Eyes: Negative.   Respiratory:  Positive for shortness of breath (with moderate exertion). Negative for cough and chest tightness.   Cardiovascular:  Positive for palpitations and leg swelling (both feet). Negative for chest pain.  Gastrointestinal:  Positive for abdominal distention ("little bloated"). Negative for abdominal pain.  Endocrine: Negative.   Genitourinary: Negative.   Musculoskeletal:  Positive for arthralgias (knees hurting). Negative for back pain.  Skin: Negative.   Allergic/Immunologic: Negative.   Neurological:  Negative for dizziness and light-headedness.  Hematological:  Negative for adenopathy. Does not bruise/bleed easily.  Psychiatric/Behavioral:  Positive for sleep disturbance (sleeping on 2 pillows). Negative for dysphoric mood. The patient is not nervous/anxious.      Physical Exam Vitals and nursing note reviewed.  Constitutional:  Appearance: She is well-developed.  HENT:     Head: Normocephalic and atraumatic.  Neck:     Thyroid: No thyromegaly.  Cardiovascular:     Rate and Rhythm: Normal rate and regular rhythm.  Pulmonary:     Effort: Pulmonary effort is normal. No respiratory distress.     Breath sounds: No wheezing or rales.  Abdominal:     General: There is no distension.     Palpations: Abdomen is soft.  Musculoskeletal:     Cervical back: Normal range of motion and neck supple.     Right lower leg: No tenderness. Edema (nonpitting in foot) present.     Left lower leg: No tenderness. Edema (nonpitting in foot) present.  Skin:    General: Skin is warm and dry.  Neurological:     Mental Status: She is alert and oriented to person, place, and time.  Psychiatric:        Behavior: Behavior normal.   Assessment & Plan:  1: Chronic heart failure with preserved ejection fraction- - NYHA class III - euvolemic today -  weighing daily and she was reminded to call for an overnight weight gain of >2 pounds or a weekly weight gain of >5 pounds  - follows with cardiology [name]) - not adding salt and it was reviewed about keeping daily sodium intake to 2000mg  daily - drinking ~ 2L of water daily - get compression socks and wear them daily with removal at bedtime - BNP 09/12/20 was 64.0   2: HTN- -  - saw PCP Earlene Plater) 11/17/19 & had telemedicine visit yesterday - BMP 02/22/21 reviewed and showed sodium 140, potassium 4.0, creatinine 0.98 and GFR >60  3: DM- - A1c 09/13/20 was 7.9% - nonfasting glucose in clinic today was  - saw endocrinology (Caraccio) 04/10/21 - saw nephrology Eulah Pont) 02/12/21  4: Asthma- - saw pulmonology (Kasa) 02/20/21   Patient did not bring her medications nor a list. Each medication was verbally reviewed with the patient and she was encouraged to bring the bottles to every visit to confirm accuracy of list.

## 2021-05-02 NOTE — Telephone Encounter (Signed)
Patient did not show for her Heart Failure Clinic appointment on 05/02/21. Will attempt to reschedule.   

## 2021-05-04 ENCOUNTER — Encounter: Payer: Self-pay | Admitting: Oncology

## 2021-05-08 ENCOUNTER — Ambulatory Visit (INDEPENDENT_AMBULATORY_CARE_PROVIDER_SITE_OTHER): Payer: Medicare Other | Admitting: Internal Medicine

## 2021-05-08 ENCOUNTER — Other Ambulatory Visit: Payer: Self-pay

## 2021-05-08 DIAGNOSIS — J449 Chronic obstructive pulmonary disease, unspecified: Secondary | ICD-10-CM | POA: Diagnosis not present

## 2021-05-08 DIAGNOSIS — I509 Heart failure, unspecified: Secondary | ICD-10-CM

## 2021-05-08 DIAGNOSIS — I10 Essential (primary) hypertension: Secondary | ICD-10-CM

## 2021-05-08 DIAGNOSIS — G4733 Obstructive sleep apnea (adult) (pediatric): Secondary | ICD-10-CM | POA: Diagnosis not present

## 2021-05-08 DIAGNOSIS — Z7189 Other specified counseling: Secondary | ICD-10-CM | POA: Diagnosis not present

## 2021-05-08 DIAGNOSIS — K219 Gastro-esophageal reflux disease without esophagitis: Secondary | ICD-10-CM

## 2021-05-08 NOTE — Patient Instructions (Signed)

## 2021-05-08 NOTE — Progress Notes (Signed)
[facility] [address] [place], [place] [address]  Pulmonary Sleep Medicine   Office Visit Note  Patient Name: Orit Sanville DOB: June 22, 1966 MRN [id number]  I connected with  Levander Campion on 05/08/21 by a video enabled telemedicine application and verified that I am speaking with the correct person using two identifiers.   I discussed the limitations of evaluation and management by telemedicine. The patient expressed understanding and agreed to proceed.   Chief Complaint: Obstructive Sleep Apnea visit  Brief History:  Josephene is seen today for follow up after setup on replacement CPAP@12cmH20  The patient has a 8 year history of sleep apnea. Patient is not using PAP nightly.  The patient feels better after sleeping with PAP.  The patient reports benefit from PAP use. Reported sleepiness is  improved and the Epworth Sleepiness Score is 13 out of 24. The patient does take naps. The patient complains of the following: none.  Sometimes falls asleep without it on  which limits use time. Will set alarm to put machine on and knows she must must get at least 4 hours per night. The compliance download shows 40% compliance with an average use time of 4.7 hours. The AHI is 1.8  The patient does not complain of limb movements disrupting sleep.  ROS  General: (-) fever, (-) chills, (-) night sweat Nose and Sinuses: (-) nasal stuffiness or itchiness, (-) postnasal drip, (-) nosebleeds, (-) sinus trouble. Mouth and Throat: (-) sore throat, (-) hoarseness. Neck: (-) swollen glands, (-) enlarged thyroid, (-) neck pain. Respiratory: - cough, - shortness of breath, - wheezing. Neurologic: + numbness, + tingling. Psychiatric: + anxiety, + depression   Current Medication: Outpatient Encounter Medications as of 05/08/2021  Medication Sig Note   buprenorphine (BUTRANS) 10 MCG/HR PTWK     ACCU-CHEK GUIDE test strip     albuterol (VENTOLIN HFA) 108 (90 Base) MCG/ACT inhaler Inhale 2 puffs  into the lungs every 6 (six) hours as needed for wheezing or shortness of breath.    Ascorbic Acid (VITAMIN C CR) 1000 MG TBCR Take 1 tablet by mouth daily.    B-D UF III MINI PEN NEEDLES 31G X 5 MM MISC Inject into the skin daily.    BD PEN NEEDLE MICRO U/F 32G X 6 MM MISC Inject into the skin.    cholecalciferol (VITAMIN D) 25 MCG (1000 UNIT) tablet Take 1,000 Units by mouth daily.    desvenlafaxine (PRISTIQ) 50 MG 24 hr tablet Take 50 mg by mouth daily.    dexmethylphenidate (FOCALIN XR) 20 MG 24 hr capsule Take 20 mg by mouth every morning.    estradiol (ESTRACE) 0.1 MG/GM vaginal cream  11/13/2020: prn   ferrous sulfate 324 MG TBEC Take 324 mg by mouth daily. (Patient not taking: Reported on 02/22/2021)    flunisolide (NASALIDE) 25 MCG/ACT (0.025%) SOLN Place into the nose. (Patient not taking: Reported on 02/22/2021) 08/15/2020: For sinsus infection pt takes prn Has not taken in a while   furosemide (LASIX) 40 MG tablet Take 40 mg by mouth daily.    hydrOXYzine (VISTARIL) 25 MG capsule Take 50-75 mg by mouth at bedtime.    INPEN 100-GRAY-LILLY DEVI as directed.    Insulin Pen Needle 31G X 6 MM MISC SMARTSIG:1 Needle SUB-Q 4 Times Daily    ipratropium-albuterol (DUONEB) 0.5-2.5 (3) MG/3ML SOLN Inhale into the lungs.    JARDIANCE 25 MG TABS tablet Take 25 mg by mouth daily.    lidocaine (LIDODERM) 5 % lidocaine 5 %  topical patch  PLEASE SEE ATTACHED FOR DETAILED DIRECTIONS 08/15/2020: Prn 12 hours on 12 hours off   LINZESS 290 MCG CAPS capsule Take 290 mcg by mouth daily.    LORazepam (ATIVAN) 1 MG tablet Take by mouth.    mirtazapine (REMERON SOL-TAB) 30 MG disintegrating tablet Take 30 mg by mouth at bedtime as needed (Nap).     mirtazapine (REMERON) 15 MG tablet Take 30 mg by mouth at bedtime.    montelukast (SINGULAIR) 10 MG tablet Take 10 mg by mouth daily. 08/15/2020: Take at hs   nabumetone (RELAFEN) 500 MG tablet Take 1 tablet by mouth 2 (two) times daily.    NOVOLOG FLEXPEN 100  UNIT/ML FlexPen Inject 10 Units into the skin daily. 11/13/2020: 10 units per meal   omeprazole (PRILOSEC) 40 MG capsule Take 40 mg by mouth daily.    potassium chloride SA (KLOR-CON) 20 MEQ tablet Take 20 mEq by mouth daily.    pravastatin (PRAVACHOL) 40 MG tablet Take 40 mg by mouth daily.    predniSONE (DELTASONE) 10 MG tablet Take 50 mg daily taper by 10 mg daily then stop (Patient not taking: Reported on 02/22/2021)    pregabalin (LYRICA) 150 MG capsule Take 150 mg by mouth 3 (three) times daily. 08/15/2020: Usually qd She only takes q hs   PROAIR RESPICLICK 108 (90 Base) MCG/ACT AEPB Inhale 2 puffs into the lungs every 4 (four) hours as needed.    propranolol ER (INDERAL LA) 80 MG 24 hr capsule Take 80 mg by mouth daily.    Semaglutide, 1 MG/DOSE, (OZEMPIC, 1 MG/DOSE,) 2 MG/1.5ML SOPN Inject into the skin.    SUMAtriptan (IMITREX) 50 MG tablet Take 1 tablet (50 mg total) by mouth every 2 (two) hours as needed for migraine. May repeat in 2 hours if headache persists or recurs.  Not to exceed 4 pills in a 24-hour period.  If you find you need more medication and you have taken 4 pills, please go directly to the emergency room or call 911.    tiotropium (SPIRIVA HANDIHALER) 18 MCG inhalation capsule Place 1 capsule (18 mcg total) into inhaler and inhale daily.    tiZANidine (ZANAFLEX) 4 MG tablet Take 4 mg by mouth every 8 (eight) hours as needed for muscle spasms.  08/15/2020: fibromyalgia   topiramate (TOPAMAX) 100 MG tablet Take 100 mg by mouth 2 (two) times daily. 08/15/2020: Use for epilepsy   TRESIBA FLEXTOUCH 200 UNIT/ML FlexTouch Pen 72 units per day seems very high for you.  Please follow up with your outpatient provider for further adjustment. 08/15/2020: Takes 84 units per pt   triamcinolone ointment (KENALOG) 0.1 % Apply topically.    TRUEPLUS PEN NEEDLES 31G X 6 MM MISC     VRAYLAR 4.5 MG CAPS Take 1 capsule by mouth daily.    zolpidem (AMBIEN) 10 MG tablet Take 10 mg by mouth at  bedtime. 08/15/2020: Prn    [DISCONTINUED] liraglutide (VICTOZA) 18 MG/3ML SOPN Inject 0.5 mLs (3 mg total) into the skin daily.    [DISCONTINUED] losartan (COZAAR) 25 MG tablet Take 25 mg by mouth daily.    [DISCONTINUED] meloxicam (MOBIC) 15 MG tablet Take 1 tablet by mouth daily. (Patient not taking: Reported on 02/22/2021)    No facility-administered encounter medications on file as of 05/08/2021.    Surgical History: Past Surgical History:  Procedure Laterality Date   ABDOMINAL HYSTERECTOMY  2000's   ABDOMINAL HYSTERECTOMY     ABDOMINAL SURGERY     laparoscopy  x4 with lysis of adhesions   APPENDECTOMY  1986   CESAREAN SECTION  1992   CHOLECYSTECTOMY N/A 04/03/2017   Procedure: LAPAROSCOPIC CHOLECYSTECTOMY;  Surgeon: Henrene Dodge, MD;  Location: [facility] ORS;  Service: General;  Laterality: N/A;   COLONOSCOPY N/A 10/06/2018   Procedure: COLONOSCOPY;  Surgeon: Toney Reil, MD;  Location: [facility] ENDOSCOPY;  Service: Gastroenterology;  Laterality: N/A;   COLONOSCOPY WITH PROPOFOL N/A 03/10/2017   Procedure: COLONOSCOPY WITH PROPOFOL;  Surgeon: Scot Jun, MD;  Location: [facility] ENDOSCOPY;  Service: Endoscopy;  Laterality: N/A;   ECTOPIC PREGNANCY SURGERY  2000's   ESOPHAGOGASTRODUODENOSCOPY (EGD) WITH PROPOFOL N/A 03/10/2017   Procedure: ESOPHAGOGASTRODUODENOSCOPY (EGD) WITH PROPOFOL;  Surgeon: Scot Jun, MD;  Location: [facility] ENDOSCOPY;  Service: Endoscopy;  Laterality: N/A;   HERNIA REPAIR     JOINT REPLACEMENT Right 12/16/12   knee- medial - makoplasty   KNEE ARTHROSCOPY Right 2006   partial medial and lateral meniscectomies   KNEE ARTHROSCOPY Right 10/06/2015   Procedure: RIGHT KNEE ARTHROSCOPY WITH DEBRIDEMENT;  Surgeon: Erin Sons, MD;  Location: [facility];  Service: Orthopedics;  Laterality: Right;  Diabetic - insulin and oral meds   ROTATOR CUFF REPAIR Left 2006   TUBAL LIGATION  2000's    Medical History: Past Medical History:  Diagnosis Date   ADD  (attention deficit disorder)    Anxiety    Arthritis    knees, shoulder, upper back   Asthma    CFS (chronic fatigue syndrome)    Chewing difficulty    CHF (congestive heart failure) (HCC)    Chronic kidney disease    Constipation    Depression    Depression    Diabetes mellitus without complication (HCC)    Dyspnea    Environmental allergies    Fatty liver    Fibromyalgia    GERD (gastroesophageal reflux disease)    GI bleed 10/02/2018   Headache    migraines - 5x/mo   Hypertension    Hypokalemia 10/14/2017   Hypothyroidism    IBS (irritable bowel syndrome)    IDA (iron deficiency anemia) 02/23/2021   Joint pain    Lower extremity edema    Major depressive disorder, single episode    Migraine without aura and without status migrainosus, not intractable    Migraines    Motion sickness    ships   Osteoarthritis    Other specified disorders of thyroid    Sleep apnea    Stroke (HCC)    no residual deficits   Swallowing difficulty    Thyroid nodule    bilateral and goiter   Vitamin D deficiency    Wears contact lenses    Wears dentures    partial upper    Family History: Non contributory to the present illness  Social History: Social History   Socioeconomic History   Marital status: Widowed    Spouse name: Zaliyah Meikle   Number of children: Not on file   Years of education: college   Highest education level: Not on file  Occupational History   Occupation: Accountant  Tobacco Use   Smoking status: Never   Smokeless tobacco: Never  Vaping Use   Vaping Use: Never used  Substance and Sexual Activity   Alcohol use: Not Currently   Drug use: Never   Sexual activity: Yes    Birth control/protection: Surgical  Other Topics Concern   Not on file  Social History Narrative   ** Merged History Encounter **  Lives at home with son (who is mentally ill)   Ambulates independently.   Husband Dorinda Hill passed in June 2021   Daughter TT   Social Determinants  of Health   Financial Resource Strain: Low Risk    Difficulty of Paying Living Expenses: Not very hard  Food Insecurity: No Food Insecurity   Worried About Programme researcher, broadcasting/film/video in the Last Year: Never true   Barista in the Last Year: Never true  Transportation Needs: No Transportation Needs   Lack of Transportation (Medical): No   Lack of Transportation (Non-Medical): No  Physical Activity: Not on file  Stress: No Stress Concern Present   Feeling of Stress : Only a little  Social Connections: Moderately Integrated   Frequency of Communication with Friends and Family: More than three times a week   Frequency of Social Gatherings with Friends and Family: More than three times a week   Attends Religious Services: 1 to 4 times per year   Active Member of [employer] or Organizations: Yes   Attends Banker Meetings: 1 to 4 times per year   Marital Status: Widowed  Catering manager Violence: Not At Risk   Fear of Current or Ex-Partner: No   Emotionally Abused: No   Physically Abused: No   Sexually Abused: No    Vital Signs: There were no vitals taken for this visit.  Examination: General Appearance: The patient is well-developed, well-nourished, and in no distress. Neck Circumference:  Skin: Gross inspection of skin unremarkable. Head: normocephalic, no gross deformities. Eyes: no gross deformities noted. ENT: ears appear grossly normal Neurologic: Alert and oriented. No involuntary movements.    EPWORTH SLEEPINESS SCALE:  Scale:  (0)= no chance of dozing; (1)= slight chance of dozing; (2)= moderate chance of dozing; (3)= high chance of dozing  Chance  Situtation    Sitting and reading: 3    Watching TV: 2    Sitting Inactive in public: 0    As a passenger in car: 2      Lying down to rest: 3    Sitting and talking: 0    Sitting quielty after lunch: 3    In a car, stopped in traffic: 0   TOTAL SCORE:   13 out of 24    SLEEP STUDIES:  PSG  AHI 6 Spo59min 84% CPAP Titration 02/22/21   CPAP COMPLIANCE DATA:  Date Range: 04/02/21-05/01/21  Average Daily Use: 4.7 hours  Median Use: 4.8  Compliance for > 4 Hours: 40%  AHI: 1.8 respiratory events per hour  Days Used: 23/30  Mask Leak: 36.8  95th Percentile Pressure: 12         LABS: Recent Results (from the past 2160 hour(s))  Vitamin B12     Status: None   Collection Time: 02/22/21  3:18 PM  Result Value Ref Range   Vitamin B-12 443 180 - 914 pg/mL    Comment: (NOTE) This assay is not validated for testing neonatal or myeloproliferative syndrome specimens for Vitamin B12 levels. Performed at [facility], [address], [place], [place] [address]   Methylmalonic acid, serum     Status: None   Collection Time: 02/22/21  3:18 PM  Result Value Ref Range   Methylmalonic Acid, Quantitative 169 0 - 378 nmol/L    Comment: (NOTE) This test was developed and its performance characteristics determined by [facility]. It has not been cleared or approved by the Food and Drug Administration. Performed At: [facility]  [facility] [address] [place], [place] [id number] Jolene Schimke MD [id number]   Iron and TIBC     Status: None   Collection Time: 02/22/21  3:18 PM  Result Value Ref Range   Iron 46 28 - 170 ug/dL   TIBC 376 283 - 151 ug/dL   Saturation Ratios 11 10.4 - 31.8 %   UIBC 385 ug/dL    Comment: Performed at [facility], [address] [place], [place] [address]  Folate     Status: None   Collection Time: 02/22/21  3:18 PM  Result Value Ref Range   Folate 25.0 >5.9 ng/mL    Comment: Performed at [facility], [address] [place], [place] [address]  Ferritin     Status: None   Collection Time: 02/22/21  3:18 PM  Result Value Ref Range   Ferritin 38 11 - 307 ng/mL    Comment: Performed at [facility], [address] [place], [place] [address]  Copper, serum     Status: None   Collection Time: 02/22/21   3:18 PM  Result Value Ref Range   Copper 147 80 - 158 ug/dL    Comment: (NOTE) This test was developed and its performance characteristics determined by [facility]. It has not been cleared or approved by the Food and Drug Administration.                                Detection Limit = 5 Performed At: [facility] [address] [place], [place] [id number] Jolene Schimke MD [id number]   Comprehensive metabolic panel     Status: Abnormal   Collection Time: 02/22/21  3:18 PM  Result Value Ref Range   Sodium 140 135 - 145 mmol/L   Potassium 4.0 3.5 - 5.1 mmol/L   Chloride 108 98 - 111 mmol/L   CO2 28 22 - 32 mmol/L   Glucose, Bld 115 (H) 70 - 99 mg/dL    Comment: Glucose reference range applies only to samples taken after fasting for at least 8 hours.   BUN 15 6 - 20 mg/dL   Creatinine, Ser 1.69 0.44 - 1.00 mg/dL   Calcium 9.4 8.9 - 67.8 mg/dL   Total Protein 7.8 6.5 - 8.1 g/dL   Albumin 4.1 3.5 - 5.0 g/dL   AST 22 15 - 41 U/L   ALT 30 0 - 44 U/L   Alkaline Phosphatase 126 38 - 126 U/L   Total Bilirubin 0.4 0.3 - 1.2 mg/dL   GFR, Estimated >93 >81 mL/min    Comment: (NOTE) Calculated using the CKD-EPI Creatinine Equation (2021)    Anion gap 4 (L) 5 - 15    Comment: Performed at [facility], [address] [place], [place] [address]  CBC with Differential/Platelet     Status: None   Collection Time: 02/22/21  3:18 PM  Result Value Ref Range   WBC 7.3 4.0 - 10.5 K/uL   RBC 4.90 3.87 - 5.11 MIL/uL   Hemoglobin 14.0 12.0 - 15.0 g/dL   HCT 02.5 85.2 - 77.8 %   MCV 88.8 80.0 - 100.0 fL   MCH 28.6 26.0 - 34.0 pg   MCHC 32.2 30.0 - 36.0 g/dL   RDW 24.2 35.3 - 61.4 %   Platelets 319 150 - 400 K/uL   nRBC 0.0 0.0 - 0.2 %   Neutrophils Relative % 61 %   Neutro Abs  4.5 1.7 - 7.7 K/uL   Lymphocytes Relative 30 %   Lymphs Abs 2.2 0.7 - 4.0 K/uL   Monocytes Relative 7 %   Monocytes Absolute 0.5 0.1 - 1.0 K/uL   Eosinophils Relative 1 %   Eosinophils Absolute  0.0 0.0 - 0.5 K/uL   Basophils Relative 1 %   Basophils Absolute 0.0 0.0 - 0.1 K/uL   Immature Granulocytes 0 %   Abs Immature Granulocytes 0.02 0.00 - 0.07 K/uL    Comment: Performed at [facility], [address], [place], [place] [address]  Technologist smear review     Status: None   Collection Time: 02/22/21  3:22 PM  Result Value Ref Range   WBC MORPHOLOGY MORPHOLOGY UNREMARKABLE    RBC MORPHOLOGY MORPHOLOGY UNREMARKABLE    Tech Review Normal platelet morphology     Comment: Performed at [facility], [address], [place], [place] [address]    Radiology: DG Knee Complete 4 Views Right  Result Date: 11/13/2020 CLINICAL DATA:  Acute right knee pain. EXAM: RIGHT KNEE - COMPLETE 4+ VIEW COMPARISON:  May 28, 2015. FINDINGS: No evidence of fracture, dislocation, or joint effusion. Status post right medial hemiarthroplasty. Mild narrowing and osteophyte formation is seen involving the lateral joint space. Soft tissues are unremarkable. IMPRESSION: Status post right medial hemiarthroplasty. Mild degenerative joint disease is noted laterally. Electronically Signed   By: Lupita Raider M.D.   On: 11/13/2020 16:08    No results found.  No results found.    Assessment and Plan: Patient Active Problem List   Diagnosis Date Noted   IDA (iron deficiency anemia) 02/23/2021   Primary osteoarthritis of right knee 12/29/2020   OSA (obstructive sleep apnea) 12/11/2020   Seizure-like activity (HCC) 10/18/2020   COVID-19    Pneumonia due to COVID-19 virus 09/12/2020   Hypoglycemia 07/04/2020   Acute renal failure (HCC) 07/04/2020   Dehydration 07/04/2020   COPD (chronic obstructive pulmonary disease) (HCC) 07/04/2020   Chest pain 05/07/2020   Hypotension 05/07/2020   Recurrent syncope 05/07/2020   Type 2 diabetes mellitus without complication (HCC) 05/07/2020   Asthma 05/07/2020   Chronic congestive heart failure (HCC) 05/07/2020   Epilepsy (HCC)  01/12/2020   Vitamin D deficiency 01/12/2020   Class 2 severe obesity with serious comorbidity and body mass index (BMI) of 36.0 to 36.9 in adult [facility]) 01/12/2020   Syncope 08/27/2019   Syncope and collapse 08/26/2019   Asthma 07/13/2019   Hypotension 10/10/2018   Acute on chronic diastolic (congestive) heart failure (HCC) 07/17/2018   Acute on chronic heart failure (HCC) 07/08/2018   Chronic diastolic heart failure (HCC) 04/17/2018   Lymphedema 04/17/2018   Chest pain 03/28/2018   Hypertension 10/15/2017   Wears dentures 10/15/2017   Symptomatic cholelithiasis 04/02/2017   Recurrent incisional hernia 03/17/2017   Calculus of gallbladder without cholecystitis without obstruction 03/17/2017   Helicobacter pylori gastritis 03/17/2017   Depression 09/28/2016   Diabetes mellitus, type 2 (HCC) 09/28/2016   Acute respiratory failure with hypoxia (HCC) 03/18/2016   Tremor 07/01/2013   Esophageal reflux 06/22/2012   Major depressive disorder, single episode 08/22/2009   Sleep apnea 05/30/2009   Hyperlipidemia 08/10/2008      The patient does tolerate PAP and reports benefit from PAP use. The patient was reminded how to adjust mask fit and advised to change supplies regularly. The patient was also counselled on nightly use all night long. The compliance is poor. The AHI is 1.8.   1.  OSA (obstructive sleep apnea) Wear nightly  2. CPAP use counseling CPAP couseling-Discussed importance of adequate CPAP use as well as proper care and cleaning techniques of machine and all supplies.  3. Hypertension, unspecified type Continue current medication and f/u with PCP.  4. Chronic obstructive pulmonary disease, unspecified COPD type (HCC) Continue inhalers as prescribed  5. Chronic congestive heart failure, unspecified heart failure type (HCC) Followed by cardiology  6. Gastroesophageal reflux disease without esophagitis Continue PPI   General Counseling: I have discussed the findings  of the evaluation and examination with Mima.  I have also discussed any further diagnostic evaluation thatmay be needed or ordered today. Shaneka verbalizes understanding of the findings of todays visit. We also reviewed her medications today and discussed drug interactions and side effects including but not limited excessive drowsiness and altered mental states. We also discussed that there is always a risk not just to her but also people around her. she has been encouraged to call the office with any questions or concerns that should arise related to todays visit.  No orders of the defined types were placed in this encounter.       I have personally obtained a history, examined the patient, evaluated laboratory and imaging results, formulated the assessment and plan and placed orders.  This patient was seen by Lynn ItoLauren McDonough, PA-C in collaboration with Dr. Freda MunroSaadat Kayston Jodoin as a part of collaborative care agreement.   Valentino HueKathe G. Sol BlazingHenke, PhD, FAASM  Diplomate, American Board of Sleep Medicine    Yevonne PaxSaadat A Skyelar Swigart, MD [facility] HospitalFCCP Diplomate ABMS Pulmonary and Critical Care Medicine Sleep medicine

## 2021-05-16 ENCOUNTER — Other Ambulatory Visit: Payer: Self-pay | Admitting: Orthopedic Surgery

## 2021-05-16 DIAGNOSIS — Z96651 Presence of right artificial knee joint: Secondary | ICD-10-CM

## 2021-05-21 ENCOUNTER — Other Ambulatory Visit: Payer: Self-pay

## 2021-05-22 ENCOUNTER — Ambulatory Visit: Payer: Medicare Other | Admitting: Gastroenterology

## 2021-05-24 ENCOUNTER — Other Ambulatory Visit: Payer: Self-pay | Admitting: *Deleted

## 2021-05-24 ENCOUNTER — Ambulatory Visit: Payer: Medicare Other | Attending: Family | Admitting: Family

## 2021-05-24 ENCOUNTER — Other Ambulatory Visit: Payer: Self-pay

## 2021-05-24 ENCOUNTER — Other Ambulatory Visit: Payer: Self-pay | Admitting: Emergency Medicine

## 2021-05-24 ENCOUNTER — Encounter: Payer: Self-pay | Admitting: Family

## 2021-05-24 VITALS — BP 136/63 | HR 74 | Resp 18 | Ht 64.0 in | Wt 216.1 lb

## 2021-05-24 DIAGNOSIS — Z79899 Other long term (current) drug therapy: Secondary | ICD-10-CM | POA: Insufficient documentation

## 2021-05-24 DIAGNOSIS — D509 Iron deficiency anemia, unspecified: Secondary | ICD-10-CM

## 2021-05-24 DIAGNOSIS — Z888 Allergy status to other drugs, medicaments and biological substances status: Secondary | ICD-10-CM | POA: Diagnosis not present

## 2021-05-24 DIAGNOSIS — I10 Essential (primary) hypertension: Secondary | ICD-10-CM

## 2021-05-24 DIAGNOSIS — G4733 Obstructive sleep apnea (adult) (pediatric): Secondary | ICD-10-CM | POA: Diagnosis not present

## 2021-05-24 DIAGNOSIS — I5032 Chronic diastolic (congestive) heart failure: Secondary | ICD-10-CM

## 2021-05-24 DIAGNOSIS — Z8673 Personal history of transient ischemic attack (TIA), and cerebral infarction without residual deficits: Secondary | ICD-10-CM | POA: Diagnosis not present

## 2021-05-24 DIAGNOSIS — Z880 Allergy status to penicillin: Secondary | ICD-10-CM | POA: Insufficient documentation

## 2021-05-24 DIAGNOSIS — E119 Type 2 diabetes mellitus without complications: Secondary | ICD-10-CM | POA: Diagnosis not present

## 2021-05-24 DIAGNOSIS — M797 Fibromyalgia: Secondary | ICD-10-CM | POA: Insufficient documentation

## 2021-05-24 DIAGNOSIS — I110 Hypertensive heart disease with heart failure: Secondary | ICD-10-CM | POA: Insufficient documentation

## 2021-05-24 DIAGNOSIS — J45909 Unspecified asthma, uncomplicated: Secondary | ICD-10-CM | POA: Insufficient documentation

## 2021-05-24 DIAGNOSIS — Z8249 Family history of ischemic heart disease and other diseases of the circulatory system: Secondary | ICD-10-CM | POA: Diagnosis not present

## 2021-05-24 DIAGNOSIS — Z833 Family history of diabetes mellitus: Secondary | ICD-10-CM | POA: Insufficient documentation

## 2021-05-24 DIAGNOSIS — Z794 Long term (current) use of insulin: Secondary | ICD-10-CM | POA: Diagnosis not present

## 2021-05-24 DIAGNOSIS — J4540 Moderate persistent asthma, uncomplicated: Secondary | ICD-10-CM | POA: Diagnosis not present

## 2021-05-24 LAB — BASIC METABOLIC PANEL
Anion gap: 5 (ref 5–15)
BUN: 16 mg/dL (ref 6–20)
CO2: 28 mmol/L (ref 22–32)
Calcium: 9.5 mg/dL (ref 8.9–10.3)
Chloride: 106 mmol/L (ref 98–111)
Creatinine, Ser: 0.91 mg/dL (ref 0.44–1.00)
GFR, Estimated: >60 mL/min (ref >60)
Glucose, Bld: 91 mg/dL (ref 70–99)
Potassium: 4 mmol/L (ref 3.5–5.1)
Sodium: 139 mmol/L (ref 135–145)

## 2021-05-24 NOTE — Progress Notes (Signed)
Patient ID: Amanda [name], female    DOB: December 06, 1965, 55 y.o.   MRN: [id number]  HPI  [name] is a 55 y/o female with a history of asthma, DM, HTN, stroke, thyroid disease, GERD, fibromyalgia, migraines, obstructive sleep apnea and chronic heart failure.   Echo report from 05/09/20 reviewed and showed an EF of 50-55% without LVH. Had echo done 10/04/2018 but unable to view those results. Echo report from 03/29/18 reviewed and showed an EF of 55-60%.  Has not been admitted or been in the ED in the last 6 months.   She presents today for a follow-up visit although hasn't been seen since April 2021. She presents with a chief complaint of moderate fatigue with little exertion. She describes this as chronic in nature having been present for several years. She has associated cough, shortness of breath, wheezing, chest pain, pedal edema, palpitations, abdominal distention, light-headedness and difficulty sleeping. She denies any chest tightness or weight gain.   She says that she can no longer be seen at [facility]ology [facility] as she was released from the practice due to many NS visits. Hoping to get established with her daughter's cardiologist at [facility].   She is drinking 3L of fluids daily because when she drinks the 2L / day, she ends up hypotensive and then in the ED or admitted.   Has been taking her furosemide BID with 1 potassium daily.   Past Medical History:  Diagnosis Date   ADD (attention deficit disorder)    Anxiety    Arthritis    knees, shoulder, upper back   Asthma    CFS (chronic fatigue syndrome)    Chewing difficulty    CHF (congestive heart failure) (HCC)    Chronic kidney disease    Constipation    Depression    Depression    Diabetes mellitus without complication (HCC)    Dyspnea    Environmental allergies    Fatty liver    Fibromyalgia    GERD (gastroesophageal reflux disease)    GI bleed 10/02/2018   Headache    migraines - 5x/mo   Hypertension    Hypokalemia  10/14/2017   Hypothyroidism    IBS (irritable bowel syndrome)    IDA (iron deficiency anemia) 02/23/2021   Joint pain    Lower extremity edema    Major depressive disorder, single episode    Migraine without aura and without status migrainosus, not intractable    Migraines    Motion sickness    ships   Osteoarthritis    Other specified disorders of thyroid    Sleep apnea    Stroke (HCC)    no residual deficits   Swallowing difficulty    Thyroid nodule    bilateral and goiter   Vitamin D deficiency    Wears contact lenses    Wears dentures    partial upper   Past Surgical History:  Procedure Laterality Date   ABDOMINAL HYSTERECTOMY  2000's   ABDOMINAL HYSTERECTOMY     ABDOMINAL SURGERY     laparoscopy x4 with lysis of adhesions   APPENDECTOMY  1986   CESAREAN SECTION  1992   CHOLECYSTECTOMY N/A 04/03/2017   Procedure: LAPAROSCOPIC CHOLECYSTECTOMY;  Surgeon: Henrene Dodge, MD;  Location: [facility] ORS;  Service: General;  Laterality: N/A;   COLONOSCOPY N/A 10/06/2018   Procedure: COLONOSCOPY;  Surgeon: Toney Reil, MD;  Location: [facility] ENDOSCOPY;  Service: Gastroenterology;  Laterality: N/A;   COLONOSCOPY WITH PROPOFOL N/A 03/10/2017   Procedure:  COLONOSCOPY WITH PROPOFOL;  Surgeon: Scot Jun, MD;  Location: [facility] ENDOSCOPY;  Service: Endoscopy;  Laterality: N/A;   ECTOPIC PREGNANCY SURGERY  2000's   ESOPHAGOGASTRODUODENOSCOPY (EGD) WITH PROPOFOL N/A 03/10/2017   Procedure: ESOPHAGOGASTRODUODENOSCOPY (EGD) WITH PROPOFOL;  Surgeon: Scot Jun, MD;  Location: [facility] ENDOSCOPY;  Service: Endoscopy;  Laterality: N/A;   HERNIA REPAIR     JOINT REPLACEMENT Right 12/16/12   knee- medial - makoplasty   KNEE ARTHROSCOPY Right 2006   partial medial and lateral meniscectomies   KNEE ARTHROSCOPY Right 10/06/2015   Procedure: RIGHT KNEE ARTHROSCOPY WITH DEBRIDEMENT;  Surgeon: Erin Sons, MD;  Location: [facility];  Service: Orthopedics;  Laterality: Right;  Diabetic -  insulin and oral meds   ROTATOR CUFF REPAIR Left 2006   TUBAL LIGATION  2000's   Family History  Problem Relation Age of Onset   Hypertension Mother    Diabetes Mother    Heart disease Mother    Depression Mother    Obesity Mother    Hypertension Father    Diabetes Father    Allergies Father    Kidney disease Father    Cancer Father    Breast cancer Neg Hx    Social History   Tobacco Use   Smoking status: Never   Smokeless tobacco: Never  Substance Use Topics   Alcohol use: Not Currently   Allergies  Allergen Reactions   Penicillins Anaphylaxis    Has patient had a PCN reaction causing immediate rash, facial/tongue/throat swelling, SOB or lightheadedness with hypotension: Yes Has patient had a PCN reaction causing severe rash involving mucus membranes or skin necrosis: No Has patient had a PCN reaction that required hospitalization No Has patient had a PCN reaction occurring within the last 10 years: No If all of the above answers are "NO", then may proceed with Cephalosporin use.  Has patient had a PCN reaction causing immediate rash, facial/tongue/throat swelling, SOB or lightheadedness with hypotension: Yes Has patient had a PCN reaction causing severe rash involving mucus membranes or skin necrosis: No Has patient had a PCN reaction that required hospitalization No Has patient had a PCN reaction occurring within the last 10 years: No If all of the above answers are "NO", then may proceed with Cephalosporin use. Other reaction(s): Other (See Comments) Severe/almost died Other reaction(s): ANAPHYLAXIS Has patient had a PCN reaction causing immediate rash, facial/tongue/throat swelling, SOB or lightheadedness with hypotension: Yes Has patient had a PCN reaction causing severe rash involving mucus membranes or skin necrosis: No Has patient had a PCN reaction that required hospitalization No Has patient had a PCN reaction occurring within the last 10 years: No If all of  the above answers are "NO", then may proceed with Cephalosporin use. Other reaction(s): Other (See Comments) Severe/almost died Other reaction(s): ANAPHYLAXIS Has patient had a PCN reaction causing immediate rash, facial/tongue/throat swelling, SOB or lightheadedness with hypotension: Yes Has patient had a PCN reaction causing severe rash involving mucus membranes or skin necrosis: No Has patient had a PCN reaction that required hospitalization No Has patient had a PCN reaction occurring within the last 10 years: No If all of the above answers are "NO", then may proceed with Cephalospo... (TRUNCATED)   Penicillins Anaphylaxis   Ace Inhibitors Swelling   Ace Inhibitors Swelling   Prior to Admission medications   Medication Sig Start Date End Date Taking? Authorizing Provider  ACCU-CHEK GUIDE test strip  12/06/20  Yes [provider]  albuterol (VENTOLIN HFA) 108 (90  Base) MCG/ACT inhaler Inhale 2 puffs into the lungs every 6 (six) hours as needed for wheezing or shortness of breath. 02/20/21  Yes Kasa, Wallis Bamberg, MD  Ascorbic Acid (VITAMIN C CR) 1000 MG TBCR Take 1 tablet by mouth daily.   Yes [provider]  B-D UF III MINI PEN NEEDLES 31G X 5 MM MISC Inject into the skin daily. 03/18/20  Yes [provider]  BD PEN NEEDLE MICRO U/F 32G X 6 MM MISC Inject into the skin. 06/14/20  Yes [provider]  buprenorphine Lavera Guise) 10 MCG/HR PTWK  03/29/21  Yes [provider]  cholecalciferol (VITAMIN D) 25 MCG (1000 UNIT) tablet Take 1,000 Units by mouth daily. 05/01/20  Yes [provider]  desvenlafaxine (PRISTIQ) 50 MG 24 hr tablet Take 50 mg by mouth daily. 01/28/21  Yes [provider]  Dexmethylphenidate HCl 25 MG CP24 Take 1 capsule by mouth every morning. 05/09/21  Yes [provider]  estradiol (ESTRACE) 0.1 MG/GM vaginal cream  10/15/19  Yes [provider]  ferrous sulfate 324 MG TBEC Take 324 mg by mouth daily.  08/22/20  Yes [provider]  flunisolide (NASALIDE) 25 MCG/ACT (0.025%) SOLN Place into the nose.   Yes [provider]  furosemide (LASIX) 40 MG tablet Take 40 mg by mouth 2 (two) times daily. 01/30/21  Yes [provider]  HUMALOG KWIKPEN 100 UNIT/ML KwikPen Inject into the skin. 04/24/21  Yes [provider]  hydrocortisone 2.5 % ointment Apply topically 2 (two) times daily. 01/23/21  Yes [provider]  hydrOXYzine (ATARAX/VISTARIL) 25 MG tablet  04/09/21  Yes [provider]  INPEN 100-GRAY-LILLY DEVI as directed. 06/13/20  Yes [provider]  Insulin Pen Needle 31G X 6 MM MISC SMARTSIG:1 Needle SUB-Q 4 Times Daily 05/19/20  Yes [provider]  ipratropium-albuterol (DUONEB) 0.5-2.5 (3) MG/3ML SOLN Inhale into the lungs.   Yes [provider]  JARDIANCE 25 MG TABS tablet Take 25 mg by mouth daily. 03/18/20  Yes [provider]  LATUDA 40 MG TABS tablet Take 40 mg by mouth at bedtime. 03/09/21  Yes [provider]  lidocaine (LIDODERM) 5 % lidocaine 5 % topical patch  PLEASE SEE ATTACHED FOR DETAILED DIRECTIONS 08/16/19  Yes [provider]  LINZESS 290 MCG CAPS capsule Take 290 mcg by mouth daily. 04/12/20  Yes [provider]  LORazepam (ATIVAN) 1 MG tablet Take by mouth. 09/20/20  Yes [provider]  mirtazapine (REMERON SOL-TAB) 30 MG disintegrating tablet Take 30 mg by mouth at bedtime as needed (Nap).  03/18/18  Yes [provider]  montelukast (SINGULAIR) 10 MG tablet Take 10 mg by mouth daily. 04/12/20  Yes [provider]  nabumetone (RELAFEN) 500 MG tablet Take by mouth. 12/29/20  Yes [provider]  NOVOLOG FLEXPEN 100 UNIT/ML FlexPen Inject 10 Units into the skin daily. 05/22/20  Yes [provider]  omeprazole (PRILOSEC) 40 MG capsule Take 40 mg by mouth daily. 04/12/20  Yes [provider]  potassium chloride SA  (KLOR-CON) 20 MEQ tablet Take 20 mEq by mouth daily. 04/12/20  Yes [provider]  pravastatin (PRAVACHOL) 40 MG tablet Take 40 mg by mouth daily. 04/12/20  Yes [provider]  pregabalin (LYRICA) 150 MG capsule Take 150 mg by mouth 3 (three) times daily. 04/12/20  Yes [provider]  PROAIR RESPICLICK 108 (90 Base) MCG/ACT AEPB Inhale 2 puffs into the lungs every 4 (four) hours as needed.  11/12/19  Yes [provider]  propranolol (INNOPRAN XL) 80 MG 24 hr capsule  01/30/21  Yes [provider]  Semaglutide, 1 MG/DOSE, (OZEMPIC, 1 MG/DOSE,) 2 MG/1.5ML SOPN Inject into the skin.   Yes [provider]  SUMAtriptan (IMITREX) 50 MG tablet Take 1 tablet (50 mg total) by mouth every 2 (two) hours as needed for migraine. May repeat in 2 hours if headache persists or recurs.  Not to exceed 4 pills in a 24-hour period.  If you find you need more medication and you have taken 4 pills, please go directly to the emergency room or call 911. 11/13/20  Yes Delton See, MD  tiotropium (SPIRIVA HANDIHALER) 18 MCG inhalation capsule Place 1 capsule (18 mcg total) into inhaler and inhale daily. 02/20/21 02/20/22 Yes Kasa, Wallis Bamberg, MD  tiZANidine (ZANAFLEX) 4 MG tablet Take 4 mg by mouth every 8 (eight) hours as needed for muscle spasms.    Yes [provider]  topiramate (TOPAMAX) 100 MG tablet Take 100 mg by mouth 2 (two) times daily. 04/12/20  Yes [provider]  TRESIBA FLEXTOUCH 200 UNIT/ML FlexTouch Pen 72 units per day seems very high for you.  Please follow up with your outpatient provider for further adjustment. 07/04/20  Yes Darlin Priestly, MD  triamcinolone ointment (KENALOG) 0.1 % Apply topically. 01/23/21 01/23/22 Yes [provider]  TRUEPLUS PEN NEEDLES 31G X 6 MM MISC  05/22/20  Yes [provider]  VRAYLAR 6 MG CAPS Take 1 capsule by mouth daily. 05/09/21  Yes [provider]  zolpidem (AMBIEN) 10 MG tablet Take 10 mg  by mouth at bedtime. 06/23/20  Yes [provider]   Review of Systems  Constitutional:  Positive for fatigue (with minimal exertion). Negative for appetite change.  HENT:  Negative for congestion and postnasal drip.   Eyes: Negative.   Respiratory:  Positive for cough ("little bit"), shortness of breath (with moderate exertion) and wheezing. Negative for chest tightness.   Cardiovascular:  Positive for chest pain ("little bit"), palpitations and leg swelling (both feet).  Gastrointestinal:  Positive for abdominal distention ("little bloated"). Negative for abdominal pain.  Endocrine: Negative.   Genitourinary: Negative.   Musculoskeletal:  Positive for arthralgias (right knee). Negative for back pain.  Skin: Negative.   Allergic/Immunologic: Negative.   Neurological:  Positive for light-headedness (sometimes). Negative for dizziness.  Hematological:  Negative for adenopathy. Does not bruise/bleed easily.  Psychiatric/Behavioral:  Positive for sleep disturbance (sleeping on 2 pillows). Negative for dysphoric mood. The patient is not nervous/anxious.    Vitals:   05/24/21 1332  BP: 136/63  Pulse: 74  Resp: 18  SpO2: 100%  Weight: 216 lb 2 oz (98 kg)  Height: 5\' 4"  (1.626 m)   Wt Readings from Last 3 Encounters:  05/24/21 216 lb 2 oz (98 kg)  02/22/21 211 lb (95.7 kg)  12/11/20 215 lb (97.5 kg)   Lab Results  Component Value Date   CREATININE 0.98 02/22/2021   CREATININE 0.86 09/24/2020   CREATININE 0.79 09/13/2020   Physical Exam Vitals and nursing note reviewed.  Constitutional:      Appearance: She is well-developed.  HENT:     Head: Normocephalic and atraumatic.  Neck:     Thyroid: No thyromegaly.  Cardiovascular:     Rate and Rhythm: Normal rate and regular rhythm.  Pulmonary:     Effort: Pulmonary effort is normal. No respiratory distress.     Breath sounds: No wheezing or rales.  Abdominal:  General: There is no distension.     Palpations: Abdomen  is soft.  Musculoskeletal:     Cervical back: Normal range of motion and neck supple.     Right lower leg: No tenderness. Edema (nonpitting in foot) present.     Left lower leg: No tenderness. Edema (nonpitting in foot) present.  Skin:    General: Skin is warm and dry.  Neurological:     Mental Status: She is alert and oriented to person, place, and time.  Psychiatric:        Behavior: Behavior normal.   Assessment & Plan:  1: Chronic heart failure with preserved ejection fraction- - NYHA class III - euvolemic today - weighing daily and she was reminded to call for an overnight weight gain of >2 pounds or a weekly weight gain of >5 pounds - follows with cardiology [name]) in the past but has been released from his practice due to NS; planning on getting established with cardiology at [facility] - not adding salt and trying to follow a low sodium diet - drinking ~ 3L of water daily as she ends up hypotensive if she only drinks 2L  - has compression socks but hasn't been wearing them daily; instructed to put them on every morning with removal at bedtime - will get BMP today as she's been taking her diuretic BID but only 1 potassium daily - referral made to cardiac rehab - BNP 09/12/20 was 64.0   2: HTN- - BP looks good today (136/63) - saw PCP Let[facility]ate at [facility]) a few months ago - BMP 02/22/21 reviewed and showed sodium 140, potassium 4.0, creatinine 0.98 and GFR >60  3: DM- - A1c 09/13/20 was 7.9% - glucose at home today was 179 - saw endocrinology ([facility]) 04/10/21 - saw nephrology Eulah Pont) 02/12/21  4: Asthma- - saw pulmonology [name]) 05/08/21   Patient did not bring her medications nor a list. Each medication was verbally reviewed with the patient and she was encouraged to bring the bottles to every visit to confirm accuracy of list.  Return in 1 month or sooner for any questions/problems before then.

## 2021-05-24 NOTE — Patient Instructions (Addendum)
Resume weighing daily and call for an overnight weight gain of > 2 pounds or a weekly weight gain of >5 pounds.    Wear compression socks daily with removal at bedtime.    Call cardiologist to schedule new patient appointment

## 2021-05-25 ENCOUNTER — Inpatient Hospital Stay: Payer: Medicare Other

## 2021-05-25 ENCOUNTER — Other Ambulatory Visit: Payer: Self-pay | Admitting: Family

## 2021-05-25 ENCOUNTER — Inpatient Hospital Stay: Payer: Medicare Other | Admitting: Oncology

## 2021-05-25 ENCOUNTER — Telehealth: Payer: Self-pay

## 2021-05-25 MED ORDER — POTASSIUM CHLORIDE CRYS ER 20 MEQ PO TBCR: 20.0000 meq | EXTENDED_RELEASE_TABLET | Freq: Every day | ORAL | 3 refills | Status: DC

## 2021-05-25 MED ORDER — PROPRANOLOL HCL ER 80 MG PO CP24: 80.0000 mg | ORAL_CAPSULE | Freq: Every day | ORAL | 3 refills | Status: DC

## 2021-05-25 MED ORDER — FUROSEMIDE 40 MG PO TABS: 40.0000 mg | ORAL_TABLET | Freq: Two times a day (BID) | ORAL | 3 refills | Status: DC

## 2021-05-25 NOTE — Progress Notes (Signed)
She requested a 90 day supply sent to [facility] [place].

## 2021-05-25 NOTE — Telephone Encounter (Addendum)
Patient notified of normal lab results and relayed the below message. Pt requested a 90 say supply of medication refills, and asked [facility] [place].  Suanne Marker, RN [facility]   ----- Message from Delma Freeze, FNP sent at 05/25/2021 10:00 AM EDT ----- Labs are normal so continue all medications. Confirm which pharmacy she would like refills of her lasix, potassium and propranolol sent to and if she wants 30 day or 90 day supply.

## 2021-05-25 NOTE — Progress Notes (Signed)
Per patient request, sent 90 day RX of furosemide, potassium and [facility]ol to [facility]

## 2021-05-31 ENCOUNTER — Inpatient Hospital Stay: Payer: Medicare Other | Admitting: Oncology

## 2021-05-31 ENCOUNTER — Inpatient Hospital Stay: Payer: Medicare Other

## 2021-05-31 DIAGNOSIS — Z139 Encounter for screening, unspecified: Secondary | ICD-10-CM

## 2021-05-31 LAB — GLUCOSE, POCT (MANUAL RESULT ENTRY): POC Glucose: 165 mg/dl — AB (ref 70–99)

## 2021-05-31 NOTE — Congregational Nurse Program (Signed)
  Dept: [phone]   Congregational Nurse Program Note  Date of Encounter: 05/31/2021  Past Medical History: Past Medical History:  Diagnosis Date   ADD (attention deficit disorder)    Anxiety    Arthritis    knees, shoulder, upper back   Asthma    CFS (chronic fatigue syndrome)    Chewing difficulty    CHF (congestive heart failure) (HCC)    Chronic kidney disease    Constipation    Depression    Depression    Diabetes mellitus without complication (HCC)    Dyspnea    Environmental allergies    Fatty liver    Fibromyalgia    GERD (gastroesophageal reflux disease)    GI bleed 10/02/2018   Headache    migraines - 5x/mo   Hypertension    Hypokalemia 10/14/2017   Hypothyroidism    IBS (irritable bowel syndrome)    IDA (iron deficiency anemia) 02/23/2021   Joint pain    Lower extremity edema    Major depressive disorder, single episode    Migraine without aura and without status migrainosus, not intractable    Migraines    Motion sickness    ships   Osteoarthritis    Other specified disorders of thyroid    Sleep apnea    Stroke (HCC)    no residual deficits   Swallowing difficulty    Thyroid nodule    bilateral and goiter   Vitamin D deficiency    Wears contact lenses    Wears dentures    partial upper    Encounter Details:  CNP Questionnaire - 05/31/21 0912       Questionnaire   Do you give verbal consent to treat you today? Yes    Visit Setting Church or Engineer, technical sales Patient Served At [employer], [employer]    Patient Status Not Medical laboratory scientific officer Yes    Insurance Medicaid    Intervention Assess (including screenings);Educate;Refer    Food Have food insecurities    Referrals PCP - other provider            seen 05/30/2021 1:50 pm. Client requests BP and glucose screening @ [facility] only clinic [facility]. Accompanied by daughter. Agrees to service. Confidentiality discussed. BP with large cuff 98/60. Admits to  occasionally becoming light headed when standing. Admits hasn't drank much water today. Nonfasting glucose 165. Provided water; counseled re sign and symptoms to seek medical care. Co to increase fluid intake. Co re limiting sweets, adding protein to diet, importance of following up with PCP Dr. Letta Pate at [facility] clinic. Client and daughter verbalize understanding. Encouraged to follow up in this clinic in 1 week. Rhermann, RN

## 2021-05-31 NOTE — Progress Notes (Deleted)
CONSULT NOTE  Patient Care Team: Emogene Morgan, MD as PCP - General (Family Medicine) Delma Freeze, FNP as Nurse Practitioner (Family Medicine) Laurier Nancy, MD as Consulting Physician (Cardiology) Andee Poles, [facility] (Inactive) as Pharmacist (Pharmacist) Pleasant, Dennard Schaumann, RN as [facility] Care Management  CHIEF COMPLAINTS/PURPOSE OF CONSULTATION:  Anemia   HISTORY OF PRESENTING ILLNESS:  Amanda [name] 55 y.o. female is here because of low ferritin levels.  Her hemoglobin has been normal- baseline is around 12.5-13.  Most recent ferritin is from 12/14/2020 and was 34, TIBC 455 and iron saturation 12%.  She has a past medical history significant for hypertension, heart failure, sleep apnea, asthma, COPD, OSA, epilepsy, type 2 diabetes, osteoarthritis, acute renal failure and depression.  Patient is followed by Dr. Belia Heman for asthma and OSA.  Currently on Breo, Spiriva and albuterol.  She has a CPAP machine she uses nightly.  She had a colonoscopy in 2020 which was negative and a EGD/colonoscopy back in 2018 which showed gastritis, normal esophagus and duodenum.  She had a few internal hemorrhoids and 1 polyp found in the sigmoid colon.  Reports overall feeling fatigued and poorly.  Has ice pica prompting evaluation.  She has tried oral iron in liquid and tablet with poor toleration.  Both caused severe abdominal cramping, diarrhea nausea and vomiting.  Reports no active bleeding and no longer has menstrual cycles.  Reports 2 C-sections and hernia repair surgery with scar tissue.  Eats a fairly normal diet.  Denies a family history of hematology blood disorders.  Reports that her daughter is also iron deficient.  She gets iron infusions here at the cancer center.  MEDICAL HISTORY:  Past Medical History:  Diagnosis Date   ADD (attention deficit disorder)    Anxiety    Arthritis    knees, shoulder, upper back   Asthma    CFS (chronic fatigue syndrome)     Chewing difficulty    CHF (congestive heart failure) (HCC)    Chronic kidney disease    Constipation    Depression    Depression    Diabetes mellitus without complication (HCC)    Dyspnea    Environmental allergies    Fatty liver    Fibromyalgia    GERD (gastroesophageal reflux disease)    GI bleed 10/02/2018   Headache    migraines - 5x/mo   Hypertension    Hypokalemia 10/14/2017   Hypothyroidism    IBS (irritable bowel syndrome)    IDA (iron deficiency anemia) 02/23/2021   Joint pain    Lower extremity edema    Major depressive disorder, single episode    Migraine without aura and without status migrainosus, not intractable    Migraines    Motion sickness    ships   Osteoarthritis    Other specified disorders of thyroid    Sleep apnea    Stroke (HCC)    no residual deficits   Swallowing difficulty    Thyroid nodule    bilateral and goiter   Vitamin D deficiency    Wears contact lenses    Wears dentures    partial upper    SURGICAL HISTORY: Past Surgical History:  Procedure Laterality Date   ABDOMINAL HYSTERECTOMY  2000's   ABDOMINAL HYSTERECTOMY     ABDOMINAL SURGERY     laparoscopy x4 with lysis of adhesions   APPENDECTOMY  1986   CESAREAN SECTION  1992   CHOLECYSTECTOMY N/A 04/03/2017   Procedure: LAPAROSCOPIC CHOLECYSTECTOMY;  Surgeon: Henrene Dodge, MD;  Location: [facility] ORS;  Service: General;  Laterality: N/A;   COLONOSCOPY N/A 10/06/2018   Procedure: COLONOSCOPY;  Surgeon: Toney Reil, MD;  Location: [facility] ENDOSCOPY;  Service: Gastroenterology;  Laterality: N/A;   COLONOSCOPY WITH PROPOFOL N/A 03/10/2017   Procedure: COLONOSCOPY WITH PROPOFOL;  Surgeon: Scot Jun, MD;  Location: [facility] ENDOSCOPY;  Service: Endoscopy;  Laterality: N/A;   ECTOPIC PREGNANCY SURGERY  2000's   ESOPHAGOGASTRODUODENOSCOPY (EGD) WITH PROPOFOL N/A 03/10/2017   Procedure: ESOPHAGOGASTRODUODENOSCOPY (EGD) WITH PROPOFOL;  Surgeon: Scot Jun, MD;  Location: [facility]  ENDOSCOPY;  Service: Endoscopy;  Laterality: N/A;   HERNIA REPAIR     JOINT REPLACEMENT Right 12/16/12   knee- medial - makoplasty   KNEE ARTHROSCOPY Right 2006   partial medial and lateral meniscectomies   KNEE ARTHROSCOPY Right 10/06/2015   Procedure: RIGHT KNEE ARTHROSCOPY WITH DEBRIDEMENT;  Surgeon: Erin Sons, MD;  Location: [facility] SURGERY [facility];  Service: Orthopedics;  Laterality: Right;  Diabetic - insulin and oral meds   ROTATOR CUFF REPAIR Left 2006   TUBAL LIGATION  2000's    SOCIAL HISTORY: Social History   Socioeconomic History   Marital status: Widowed    Spouse name: Nechuma Boven   Number of children: Not on file   Years of education: college   Highest education level: Not on file  Occupational History   Occupation: Accountant  Tobacco Use   Smoking status: Never   Smokeless tobacco: Never  Vaping Use   Vaping Use: Never used  Substance and Sexual Activity   Alcohol use: Not Currently   Drug use: Never   Sexual activity: Yes    Birth control/protection: Surgical  Other Topics Concern   Not on file  Social History Narrative   ** Merged History Encounter **       Lives at home with son (who is mentally ill)   Ambulates independently.   Husband Dorinda Hill passed in June 2021   Daughter TT   Social Determinants of Health   Financial Resource Strain: Low Risk    Difficulty of Paying Living Expenses: Not very hard  Food Insecurity: No Food Insecurity   Worried About Programme researcher, broadcasting/film/video in the Last Year: Never true   Barista in the Last Year: Never true  Transportation Needs: No Transportation Needs   Lack of Transportation (Medical): No   Lack of Transportation (Non-Medical): No  Physical Activity: Not on file  Stress: No Stress Concern Present   Feeling of Stress : Only a little  Social Connections: Moderately Integrated   Frequency of Communication with Friends and Family: More than three times a week   Frequency of Social Gatherings with  Friends and Family: More than three times a week   Attends Religious Services: 1 to 4 times per year   Active Member of [employer] or Organizations: Yes   Attends Banker Meetings: 1 to 4 times per year   Marital Status: Widowed  Catering manager Violence: Not At Risk   Fear of Current or Ex-Partner: No   Emotionally Abused: No   Physically Abused: No   Sexually Abused: No    FAMILY HISTORY: Family History  Problem Relation Age of Onset   Hypertension Mother    Diabetes Mother    Heart disease Mother    Depression Mother    Obesity Mother    Hypertension Father    Diabetes Father    Allergies Father    Kidney disease  Father    Cancer Father    Breast cancer Neg Hx     ALLERGIES:  is allergic to penicillins, penicillins, ace inhibitors, and ace inhibitors.  MEDICATIONS:  Current Outpatient Medications  Medication Sig Dispense Refill   ACCU-CHEK GUIDE test strip      albuterol (VENTOLIN HFA) 108 (90 Base) MCG/ACT inhaler Inhale 2 puffs into the lungs every 6 (six) hours as needed for wheezing or shortness of breath. 8 g 6   Ascorbic Acid (VITAMIN C CR) 1000 MG TBCR Take 1 tablet by mouth daily.     B-D UF III MINI PEN NEEDLES 31G X 5 MM MISC Inject into the skin daily.     BD PEN NEEDLE MICRO U/F 32G X 6 MM MISC Inject into the skin.     buprenorphine (BUTRANS) 10 MCG/HR PTWK      cholecalciferol (VITAMIN D) 25 MCG (1000 UNIT) tablet Take 1,000 Units by mouth daily.     desvenlafaxine (PRISTIQ) 50 MG 24 hr tablet Take 50 mg by mouth daily.     Dexmethylphenidate HCl 25 MG CP24 Take 1 capsule by mouth every morning.     estradiol (ESTRACE) 0.1 MG/GM vaginal cream      ferrous sulfate 324 MG TBEC Take 324 mg by mouth daily.     flunisolide (NASALIDE) 25 MCG/ACT (0.025%) SOLN Place into the nose.     furosemide (LASIX) 40 MG tablet Take 1 tablet (40 mg total) by mouth 2 (two) times daily. 180 tablet 3   HUMALOG KWIKPEN 100 UNIT/ML KwikPen Inject into the skin.      hydrocortisone 2.5 % ointment Apply topically 2 (two) times daily.     hydrOXYzine (ATARAX/VISTARIL) 25 MG tablet      INPEN 100-GRAY-LILLY DEVI as directed.     Insulin Pen Needle 31G X 6 MM MISC SMARTSIG:1 Needle SUB-Q 4 Times Daily     ipratropium-albuterol (DUONEB) 0.5-2.5 (3) MG/3ML SOLN Inhale into the lungs.     JARDIANCE 25 MG TABS tablet Take 25 mg by mouth daily.     LATUDA 40 MG TABS tablet Take 40 mg by mouth at bedtime.     lidocaine (LIDODERM) 5 % lidocaine 5 % topical patch  PLEASE SEE ATTACHED FOR DETAILED DIRECTIONS     LINZESS 290 MCG CAPS capsule Take 290 mcg by mouth daily.     LORazepam (ATIVAN) 1 MG tablet Take by mouth.     mirtazapine (REMERON SOL-TAB) 30 MG disintegrating tablet Take 30 mg by mouth at bedtime as needed (Nap).   2   montelukast (SINGULAIR) 10 MG tablet Take 10 mg by mouth daily.     nabumetone (RELAFEN) 500 MG tablet Take by mouth.     NOVOLOG FLEXPEN 100 UNIT/ML FlexPen Inject 10 Units into the skin daily.     omeprazole (PRILOSEC) 40 MG capsule Take 40 mg by mouth daily.     potassium chloride SA (KLOR-CON) 20 MEQ tablet Take 1 tablet (20 mEq total) by mouth daily. 90 tablet 3   pravastatin (PRAVACHOL) 40 MG tablet Take 40 mg by mouth daily.     pregabalin (LYRICA) 150 MG capsule Take 150 mg by mouth 3 (three) times daily.     PROAIR RESPICLICK 108 (90 Base) MCG/ACT AEPB Inhale 2 puffs into the lungs every 4 (four) hours as needed.     propranolol ER (INDERAL LA) 80 MG 24 hr capsule Take 1 capsule (80 mg total) by mouth daily. 90 capsule 3   Semaglutide, 1  MG/DOSE, (OZEMPIC, 1 MG/DOSE,) 2 MG/1.5ML SOPN Inject into the skin.     SUMAtriptan (IMITREX) 50 MG tablet Take 1 tablet (50 mg total) by mouth every 2 (two) hours as needed for migraine. May repeat in 2 hours if headache persists or recurs.  Not to exceed 4 pills in a 24-hour period.  If you find you need more medication and you have taken 4 pills, please go directly to the emergency room or call  911. 10 tablet 0   tiotropium (SPIRIVA HANDIHALER) 18 MCG inhalation capsule Place 1 capsule (18 mcg total) into inhaler and inhale daily. 30 capsule 6   tiZANidine (ZANAFLEX) 4 MG tablet Take 4 mg by mouth every 8 (eight) hours as needed for muscle spasms.      topiramate (TOPAMAX) 100 MG tablet Take 100 mg by mouth 2 (two) times daily.     TRESIBA FLEXTOUCH 200 UNIT/ML FlexTouch Pen 72 units per day seems very high for you.  Please follow up with your outpatient provider for further adjustment.     triamcinolone ointment (KENALOG) 0.1 % Apply topically.     TRUEPLUS PEN NEEDLES 31G X 6 MM MISC      VRAYLAR 6 MG CAPS Take 1 capsule by mouth daily.     zolpidem (AMBIEN) 10 MG tablet Take 10 mg by mouth at bedtime.     No current facility-administered medications for this visit.    REVIEW OF SYSTEMS:   Review of Systems  Constitutional:  Positive for malaise/fatigue. Negative for chills, fever and weight loss.  HENT:  Negative for congestion, ear pain and tinnitus.   Eyes: Negative.  Negative for blurred vision and double vision.  Respiratory: Negative.  Negative for cough, sputum production and shortness of breath.   Cardiovascular: Negative.  Negative for chest pain, palpitations and leg swelling.  Gastrointestinal: Negative.  Negative for abdominal pain, constipation, diarrhea, nausea and vomiting.  Genitourinary:  Negative for dysuria, frequency and urgency.  Musculoskeletal:  Negative for back pain and falls.  Skin: Negative.  Negative for rash.  Neurological: Negative.  Negative for weakness and headaches.  Endo/Heme/Allergies: Negative.  Does not bruise/bleed easily.  Psychiatric/Behavioral: Negative.  Negative for depression. The patient is not nervous/anxious and does not have insomnia.    PHYSICAL EXAMINATION: ECOG PERFORMANCE STATUS: 1 - Symptomatic but completely ambulatory  There were no vitals filed for this visit. There were no vitals filed for this visit.   Physical  Exam Constitutional:      Appearance: Normal appearance.  HENT:     Head: Normocephalic and atraumatic.  Eyes:     Pupils: Pupils are equal, round, and reactive to light.  Cardiovascular:     Rate and Rhythm: Normal rate and regular rhythm.     Heart sounds: Normal heart sounds. No murmur heard. Pulmonary:     Effort: Pulmonary effort is normal.     Breath sounds: Normal breath sounds. No wheezing.  Abdominal:     General: Bowel sounds are normal. There is no distension.     Palpations: Abdomen is soft.     Tenderness: There is no abdominal tenderness.  Musculoskeletal:        General: Normal range of motion.     Cervical back: Normal range of motion.  Skin:    General: Skin is warm and dry.     Findings: No rash.  Neurological:     Mental Status: She is alert and oriented to person, place, and time.  Psychiatric:  Judgment: Judgment normal.    LABORATORY DATA:  I have reviewed the data as listed Recent Results (from the past 2160 hour(s))  Basic metabolic panel     Status: None   Collection Time: 05/24/21  2:02 PM  Result Value Ref Range   Sodium 139 135 - 145 mmol/L   Potassium 4.0 3.5 - 5.1 mmol/L   Chloride 106 98 - 111 mmol/L   CO2 28 22 - 32 mmol/L   Glucose, Bld 91 70 - 99 mg/dL    Comment: Glucose reference range applies only to samples taken after fasting for at least 8 hours.   BUN 16 6 - 20 mg/dL   Creatinine, Ser 3.00 0.44 - 1.00 mg/dL   Calcium 9.5 8.9 - 76.2 mg/dL   GFR, Estimated >26 >33 mL/min    Comment: (NOTE) Calculated using the CKD-EPI Creatinine Equation (2021)    Anion gap 5 5 - 15    Comment: Performed at [facility], [address] [place], [place] [address]  POCT glucose     Status: Abnormal   Collection Time: 05/31/21  9:15 AM  Result Value Ref Range   POC Glucose 165 (A) 70 - 99 mg/dl    Comment: nonfasting; 05/30/21 1:50 pm    RADIOGRAPHIC STUDIES: I have personally reviewed the radiological images as listed and  agreed with the findings in the report. No results found.  ASSESSMENT:  Low iron levels: Discovered recently due to significant fatigue and ice pica. Reports longstanding history of iron deficiency although she is not anemic. Unable to tolerate oral iron tablets. Reports her daughter is also iron deficient. Denies any recent bleeding. Had colonoscopy in 2018 which showed gastritis and repeat in 2020 which was negative. Labs from 12/14/2020 show ferritin of 34 and iron saturation 12%, TIBC 455 and hemoglobin was 13.   PLAN:  Low iron levels: Per patient this is a longstanding history. Etiology likely secondary to gastritis and malabsorption.  Denies any active bleeds.  Has follow-up with GI on 03/14/21.  We will recheck labs today including CBC, ferritin, iron studies, B12, copper and folate. We will go ahead and get her set up for 3 doses of IV Venofer beginning next week and have her return to clinic in approximately 12 weeks to repeat her lab work. Suspect she is not anemic secondary to underlying comorbidities such as OSA, obesity and asthma/copd.  She is compliant with her sleep apnea machine. She has follow-up with GI in July.   Disposition: Repeat labs today. RTC next week for 3 doses of IV Venofer. RTC in 12 weeks for repeat lab work, NP assessment and possible IV Venofer.  Addendum: Lab work shows a normal CMP, copper level, B12 level, peripheral smear and folate level.  CBC shows hemoglobin 14.0.  Ferritin is 38 and iron saturation is 11%.  Proceed with 3 doses of IV Venofer.  No problem-specific Assessment & Plan notes found for this encounter.  Greater than 50% was spent in counseling and coordination of care with this patient including but not limited to discussion of the relevant topics above (See A&P) including, but not limited to diagnosis and management of acute and chronic medical conditions.     Mauro Kaufmann, NP 05/31/21 2:00 PM

## 2021-06-04 ENCOUNTER — Encounter
Admission: RE | Admit: 2021-06-04 | Discharge: 2021-06-04 | Disposition: A | Discharge status: Home / Self Care | Payer: Medicare Other | Source: Ambulatory Visit | Attending: Orthopedic Surgery | Admitting: Orthopedic Surgery

## 2021-06-04 ENCOUNTER — Ambulatory Visit: Payer: Medicare Other | Admitting: Cardiology

## 2021-06-04 ENCOUNTER — Other Ambulatory Visit: Payer: Self-pay

## 2021-06-04 DIAGNOSIS — Z96651 Presence of right artificial knee joint: Secondary | ICD-10-CM

## 2021-06-04 MED ORDER — TECHNETIUM TC 99M MEDRONATE IV KIT
22.7400 | PACK | Freq: Once | INTRAVENOUS | Status: AC | PRN
  Administered 2021-06-04: 22.74 via INTRAVENOUS

## 2021-06-11 ENCOUNTER — Inpatient Hospital Stay: Payer: Medicare Other | Admitting: Oncology

## 2021-06-11 ENCOUNTER — Inpatient Hospital Stay: Payer: Medicare Other

## 2021-06-14 ENCOUNTER — Inpatient Hospital Stay: Payer: Medicare Other | Attending: Oncology

## 2021-06-14 ENCOUNTER — Other Ambulatory Visit: Payer: Self-pay

## 2021-06-14 ENCOUNTER — Inpatient Hospital Stay (HOSPITAL_BASED_OUTPATIENT_CLINIC_OR_DEPARTMENT_OTHER): Payer: Medicare Other | Admitting: Oncology

## 2021-06-14 ENCOUNTER — Encounter: Payer: Self-pay | Admitting: Oncology

## 2021-06-14 ENCOUNTER — Inpatient Hospital Stay: Payer: Medicare Other

## 2021-06-14 VITALS — BP 138/84 | HR 98 | Temp 97.7°F | Resp 18 | Wt 217.3 lb

## 2021-06-14 DIAGNOSIS — M1990 Unspecified osteoarthritis, unspecified site: Secondary | ICD-10-CM | POA: Insufficient documentation

## 2021-06-14 DIAGNOSIS — M797 Fibromyalgia: Secondary | ICD-10-CM | POA: Insufficient documentation

## 2021-06-14 DIAGNOSIS — Z8673 Personal history of transient ischemic attack (TIA), and cerebral infarction without residual deficits: Secondary | ICD-10-CM | POA: Insufficient documentation

## 2021-06-14 DIAGNOSIS — J449 Chronic obstructive pulmonary disease, unspecified: Secondary | ICD-10-CM | POA: Insufficient documentation

## 2021-06-14 DIAGNOSIS — E559 Vitamin D deficiency, unspecified: Secondary | ICD-10-CM | POA: Insufficient documentation

## 2021-06-14 DIAGNOSIS — G4730 Sleep apnea, unspecified: Secondary | ICD-10-CM | POA: Diagnosis not present

## 2021-06-14 DIAGNOSIS — D649 Anemia, unspecified: Secondary | ICD-10-CM | POA: Insufficient documentation

## 2021-06-14 DIAGNOSIS — E119 Type 2 diabetes mellitus without complications: Secondary | ICD-10-CM | POA: Diagnosis not present

## 2021-06-14 DIAGNOSIS — Z79899 Other long term (current) drug therapy: Secondary | ICD-10-CM | POA: Insufficient documentation

## 2021-06-14 DIAGNOSIS — F419 Anxiety disorder, unspecified: Secondary | ICD-10-CM | POA: Insufficient documentation

## 2021-06-14 DIAGNOSIS — Z794 Long term (current) use of insulin: Secondary | ICD-10-CM | POA: Insufficient documentation

## 2021-06-14 DIAGNOSIS — K589 Irritable bowel syndrome without diarrhea: Secondary | ICD-10-CM | POA: Insufficient documentation

## 2021-06-14 DIAGNOSIS — K648 Other hemorrhoids: Secondary | ICD-10-CM | POA: Insufficient documentation

## 2021-06-14 DIAGNOSIS — I10 Essential (primary) hypertension: Secondary | ICD-10-CM | POA: Insufficient documentation

## 2021-06-14 DIAGNOSIS — I509 Heart failure, unspecified: Secondary | ICD-10-CM | POA: Diagnosis not present

## 2021-06-14 DIAGNOSIS — D509 Iron deficiency anemia, unspecified: Secondary | ICD-10-CM

## 2021-06-14 DIAGNOSIS — K219 Gastro-esophageal reflux disease without esophagitis: Secondary | ICD-10-CM | POA: Diagnosis not present

## 2021-06-14 DIAGNOSIS — E039 Hypothyroidism, unspecified: Secondary | ICD-10-CM | POA: Insufficient documentation

## 2021-06-14 LAB — CBC WITH DIFFERENTIAL/PLATELET
Abs Immature Granulocytes: 0.01 10*3/uL (ref 0.00–0.07)
Basophils Absolute: 0 10*3/uL (ref 0.0–0.1)
Basophils Relative: 1 %
Eosinophils Absolute: 0 10*3/uL (ref 0.0–0.5)
Eosinophils Relative: 1 %
HCT: 43.5 % (ref 36.0–46.0)
Hemoglobin: 14.2 g/dL (ref 12.0–15.0)
Immature Granulocytes: 0 %
Lymphocytes Relative: 31 %
Lymphs Abs: 1.9 10*3/uL (ref 0.7–4.0)
MCH: 29.3 pg (ref 26.0–34.0)
MCHC: 32.6 g/dL (ref 30.0–36.0)
MCV: 89.7 fL (ref 80.0–100.0)
Monocytes Absolute: 0.4 10*3/uL (ref 0.1–1.0)
Monocytes Relative: 6 %
Neutro Abs: 3.7 10*3/uL (ref 1.7–7.7)
Neutrophils Relative %: 61 %
Platelets: 408 10*3/uL — ABNORMAL HIGH (ref 150–400)
RBC: 4.85 MIL/uL (ref 3.87–5.11)
RDW: 13.6 % (ref 11.5–15.5)
WBC: 6 10*3/uL (ref 4.0–10.5)
nRBC: 0 % (ref 0.0–0.2)

## 2021-06-14 LAB — FOLATE: Folate: 13.3 ng/mL (ref >5.9)

## 2021-06-14 LAB — IRON AND TIBC
Iron: 72 ug/dL (ref 28–170)
Saturation Ratios: 19 % (ref 10.4–31.8)
TIBC: 388 ug/dL (ref 250–450)
UIBC: 316 ug/dL

## 2021-06-14 LAB — VITAMIN B12: Vitamin B-12: 353 pg/mL (ref 180–914)

## 2021-06-14 LAB — FERRITIN: Ferritin: 115 ng/mL (ref 11–307)

## 2021-06-14 MED ORDER — IRON SUCROSE 20 MG/ML IV SOLN
200.0000 mg | Freq: Once | INTRAVENOUS | Status: AC
  Administered 2021-06-14: 200 mg via INTRAVENOUS
  Filled 2021-06-14: qty 10

## 2021-06-14 MED ORDER — SODIUM CHLORIDE 0.9 % IV SOLN
Freq: Once | INTRAVENOUS | Status: AC
  Filled 2021-06-14: qty 250

## 2021-06-14 MED ORDER — SODIUM CHLORIDE 0.9 % IV SOLN: 200.0000 mg | Freq: Once | INTRAVENOUS | Status: DC

## 2021-06-14 NOTE — Progress Notes (Signed)
CONSULT NOTE  Patient Care Team: Emogene Morgan, MD as PCP - General (Family Medicine) Delma Freeze, FNP as Nurse Practitioner (Family Medicine) Laurier Nancy, MD as Consulting Physician (Cardiology) Andee Poles, [facility] (Inactive) as Pharmacist (Pharmacist) Pleasant, Dennard Schaumann, RN as [facility] Care Management  CHIEF COMPLAINTS/PURPOSE OF CONSULTATION:  Anemia   HISTORY OF PRESENTING ILLNESS:  Amanda [name] 56 y.o. female is here because of low ferritin levels.  Her hemoglobin has been normal- baseline is around 12.5-13.  Most recent ferritin is from 12/14/2020 and was 34, TIBC 455 and iron saturation 12%.  She has a past medical history significant for hypertension, heart failure, sleep apnea, asthma, COPD, OSA, epilepsy, type 2 diabetes, osteoarthritis, acute renal failure and depression.  Patient is followed by Dr. Belia Heman for asthma and OSA.  Currently on Breo, Spiriva and albuterol.  She has a CPAP machine she uses nightly.  She had a colonoscopy in 2020 which was negative and a EGD/colonoscopy back in 2018 which showed gastritis, normal esophagus and duodenum.  She had a few internal hemorrhoids and 1 polyp found in the sigmoid colon.  Reports overall feeling fatigued and poorly.  Has ice pica prompting evaluation.  She has tried oral iron in liquid and tablet with poor toleration.  Both caused severe abdominal cramping, diarrhea nausea and vomiting.  Reports no active bleeding and no longer has menstrual cycles.  Reports 2 C-sections and hernia repair surgery with scar tissue.  Eats a fairly normal diet.  Denies a family history of hematology blood disorders.  Reports that her daughter is also iron deficient.  She gets iron infusions here at the cancer center.  Interval History- Reports feeling tired with low energy. States energy level improved post IV iron infusions. Has ice pica. Craving ice and cold drinks.    MEDICAL HISTORY:  Past Medical History:   Diagnosis Date   ADD (attention deficit disorder)    Anxiety    Arthritis    knees, shoulder, upper back   Asthma    CFS (chronic fatigue syndrome)    Chewing difficulty    CHF (congestive heart failure) (HCC)    Chronic kidney disease    Constipation    Depression    Depression    Diabetes mellitus without complication (HCC)    Dyspnea    Environmental allergies    Fatty liver    Fibromyalgia    GERD (gastroesophageal reflux disease)    GI bleed 10/02/2018   Headache    migraines - 5x/mo   Hypertension    Hypokalemia 10/14/2017   Hypothyroidism    IBS (irritable bowel syndrome)    IDA (iron deficiency anemia) 02/23/2021   Joint pain    Lower extremity edema    Major depressive disorder, single episode    Migraine without aura and without status migrainosus, not intractable    Migraines    Motion sickness    ships   Osteoarthritis    Other specified disorders of thyroid    Sleep apnea    Stroke (HCC)    no residual deficits   Swallowing difficulty    Thyroid nodule    bilateral and goiter   Vitamin D deficiency    Wears contact lenses    Wears dentures    partial upper    SURGICAL HISTORY: Past Surgical History:  Procedure Laterality Date   ABDOMINAL HYSTERECTOMY  2000's   ABDOMINAL HYSTERECTOMY     ABDOMINAL SURGERY  laparoscopy x4 with lysis of adhesions   APPENDECTOMY  1986   CESAREAN SECTION  1992   CHOLECYSTECTOMY N/A 04/03/2017   Procedure: LAPAROSCOPIC CHOLECYSTECTOMY;  Surgeon: Henrene Dodge, MD;  Location: [facility] ORS;  Service: General;  Laterality: N/A;   COLONOSCOPY N/A 10/06/2018   Procedure: COLONOSCOPY;  Surgeon: Toney Reil, MD;  Location: [facility] ENDOSCOPY;  Service: Gastroenterology;  Laterality: N/A;   COLONOSCOPY WITH PROPOFOL N/A 03/10/2017   Procedure: COLONOSCOPY WITH PROPOFOL;  Surgeon: Scot Jun, MD;  Location: [facility] ENDOSCOPY;  Service: Endoscopy;  Laterality: N/A;   ECTOPIC PREGNANCY SURGERY  2000's    ESOPHAGOGASTRODUODENOSCOPY (EGD) WITH PROPOFOL N/A 03/10/2017   Procedure: ESOPHAGOGASTRODUODENOSCOPY (EGD) WITH PROPOFOL;  Surgeon: Scot Jun, MD;  Location: [facility] ENDOSCOPY;  Service: Endoscopy;  Laterality: N/A;   HERNIA REPAIR     JOINT REPLACEMENT Right 12/16/12   knee- medial - makoplasty   KNEE ARTHROSCOPY Right 2006   partial medial and lateral meniscectomies   KNEE ARTHROSCOPY Right 10/06/2015   Procedure: RIGHT KNEE ARTHROSCOPY WITH DEBRIDEMENT;  Surgeon: Erin Sons, MD;  Location: [facility];  Service: Orthopedics;  Laterality: Right;  Diabetic - insulin and oral meds   ROTATOR CUFF REPAIR Left 2006   TUBAL LIGATION  2000's    SOCIAL HISTORY: Social History   Socioeconomic History   Marital status: Widowed    Spouse name: Debrah Granderson   Number of children: Not on file   Years of education: college   Highest education level: Not on file  Occupational History   Occupation: Accountant  Tobacco Use   Smoking status: Never   Smokeless tobacco: Never  Vaping Use   Vaping Use: Never used  Substance and Sexual Activity   Alcohol use: Not Currently   Drug use: Never   Sexual activity: Yes    Birth control/protection: Surgical  Other Topics Concern   Not on file  Social History Narrative   ** Merged History Encounter **       Lives at home with son (who is mentally ill)   Ambulates independently.   Husband Dorinda Hill passed in June 2021   Daughter TT   Social Determinants of Health   Financial Resource Strain: Low Risk    Difficulty of Paying Living Expenses: Not very hard  Food Insecurity: No Food Insecurity   Worried About Programme researcher, broadcasting/film/video in the Last Year: Never true   Barista in the Last Year: Never true  Transportation Needs: No Transportation Needs   Lack of Transportation (Medical): No   Lack of Transportation (Non-Medical): No  Physical Activity: Not on file  Stress: No Stress Concern Present   Feeling of Stress : Only a little   Social Connections: Moderately Integrated   Frequency of Communication with Friends and Family: More than three times a week   Frequency of Social Gatherings with Friends and Family: More than three times a week   Attends Religious Services: 1 to 4 times per year   Active Member of [employer] or Organizations: Yes   Attends Banker Meetings: 1 to 4 times per year   Marital Status: Widowed  Catering manager Violence: Not At Risk   Fear of Current or Ex-Partner: No   Emotionally Abused: No   Physically Abused: No   Sexually Abused: No    FAMILY HISTORY: Family History  Problem Relation Age of Onset   Hypertension Mother    Diabetes Mother    Heart disease Mother  Depression Mother    Obesity Mother    Hypertension Father    Diabetes Father    Allergies Father    Kidney disease Father    Cancer Father    Breast cancer Neg Hx     ALLERGIES:  is allergic to penicillins, penicillins, ace inhibitors, and ace inhibitors.  MEDICATIONS:  Current Outpatient Medications  Medication Sig Dispense Refill   ACCU-CHEK GUIDE test strip      albuterol (VENTOLIN HFA) 108 (90 Base) MCG/ACT inhaler Inhale 2 puffs into the lungs every 6 (six) hours as needed for wheezing or shortness of breath. 8 g 6   Ascorbic Acid (VITAMIN C CR) 1000 MG TBCR Take 1 tablet by mouth daily.     B-D UF III MINI PEN NEEDLES 31G X 5 MM MISC Inject into the skin daily.     BD PEN NEEDLE MICRO U/F 32G X 6 MM MISC Inject into the skin.     buprenorphine (BUTRANS) 10 MCG/HR PTWK      cholecalciferol (VITAMIN D) 25 MCG (1000 UNIT) tablet Take 1,000 Units by mouth daily.     desvenlafaxine (PRISTIQ) 50 MG 24 hr tablet Take 50 mg by mouth daily.     Dexmethylphenidate HCl 25 MG CP24 Take 1 capsule by mouth every morning.     estradiol (ESTRACE) 0.1 MG/GM vaginal cream      furosemide (LASIX) 40 MG tablet Take 1 tablet (40 mg total) by mouth 2 (two) times daily. 180 tablet 3   HUMALOG KWIKPEN 100 UNIT/ML  KwikPen Inject into the skin.     hydrocortisone 2.5 % ointment Apply topically 2 (two) times daily.     hydrOXYzine (ATARAX/VISTARIL) 25 MG tablet      INPEN 100-GRAY-LILLY DEVI as directed.     Insulin Pen Needle 31G X 6 MM MISC SMARTSIG:1 Needle SUB-Q 4 Times Daily     ipratropium-albuterol (DUONEB) 0.5-2.5 (3) MG/3ML SOLN Inhale into the lungs.     JARDIANCE 25 MG TABS tablet Take 25 mg by mouth daily.     LINZESS 290 MCG CAPS capsule Take 290 mcg by mouth daily.     LORazepam (ATIVAN) 1 MG tablet Take by mouth.     losartan (COZAAR) 25 MG tablet Take 25 mg by mouth daily.     mirtazapine (REMERON SOL-TAB) 30 MG disintegrating tablet Take 30 mg by mouth at bedtime as needed (Nap).   2   montelukast (SINGULAIR) 10 MG tablet Take 10 mg by mouth daily.     nabumetone (RELAFEN) 500 MG tablet Take by mouth.     omeprazole (PRILOSEC) 40 MG capsule Take 40 mg by mouth daily.     potassium chloride SA (KLOR-CON) 20 MEQ tablet Take 1 tablet (20 mEq total) by mouth daily. 90 tablet 3   pravastatin (PRAVACHOL) 40 MG tablet Take 40 mg by mouth daily.     pregabalin (LYRICA) 150 MG capsule Take 150 mg by mouth 3 (three) times daily.     PROAIR RESPICLICK 108 (90 Base) MCG/ACT AEPB Inhale 2 puffs into the lungs every 4 (four) hours as needed.     propranolol ER (INDERAL LA) 80 MG 24 hr capsule Take 1 capsule (80 mg total) by mouth daily. 90 capsule 3   Semaglutide, 1 MG/DOSE, (OZEMPIC, 1 MG/DOSE,) 2 MG/1.5ML SOPN Inject into the skin.     tiotropium (SPIRIVA HANDIHALER) 18 MCG inhalation capsule Place 1 capsule (18 mcg total) into inhaler and inhale daily. 30 capsule 6   tiZANidine (  ZANAFLEX) 4 MG tablet Take 4 mg by mouth every 8 (eight) hours as needed for muscle spasms.      topiramate (TOPAMAX) 100 MG tablet Take 100 mg by mouth 2 (two) times daily.     TRESIBA FLEXTOUCH 200 UNIT/ML FlexTouch Pen 72 units per day seems very high for you.  Please follow up with your outpatient provider for further  adjustment.     triamcinolone ointment (KENALOG) 0.1 % Apply topically.     TRUEPLUS PEN NEEDLES 31G X 6 MM MISC      VRAYLAR 6 MG CAPS Take 1 capsule by mouth daily.     zolpidem (AMBIEN) 10 MG tablet Take 10 mg by mouth at bedtime.     ferrous sulfate 324 MG TBEC Take 324 mg by mouth daily. (Patient not taking: Reported on 06/14/2021)     flunisolide (NASALIDE) 25 MCG/ACT (0.025%) SOLN Place into the nose. (Patient not taking: Reported on 06/14/2021)     LATUDA 40 MG TABS tablet Take 40 mg by mouth at bedtime. (Patient not taking: Reported on 06/14/2021)     lidocaine (LIDODERM) 5 % lidocaine 5 % topical patch  PLEASE SEE ATTACHED FOR DETAILED DIRECTIONS (Patient not taking: Reported on 06/14/2021)     NOVOLOG FLEXPEN 100 UNIT/ML FlexPen Inject 10 Units into the skin daily. (Patient not taking: Reported on 06/14/2021)     SUMAtriptan (IMITREX) 50 MG tablet Take 1 tablet (50 mg total) by mouth every 2 (two) hours as needed for migraine. May repeat in 2 hours if headache persists or recurs.  Not to exceed 4 pills in a 24-hour period.  If you find you need more medication and you have taken 4 pills, please go directly to the emergency room or call 911. (Patient not taking: Reported on 06/14/2021) 10 tablet 0   No current facility-administered medications for this visit.    REVIEW OF SYSTEMS:   Review of Systems  Constitutional:  Positive for malaise/fatigue.   PHYSICAL EXAMINATION: ECOG PERFORMANCE STATUS: 1 - Symptomatic but completely ambulatory  Vitals:   06/14/21 1354  BP: 138/84  Pulse: 98  Resp: 18  Temp: 97.7 F (36.5 C)  SpO2: 96%   Filed Weights   06/14/21 1354  Weight: 217 lb 4.8 oz (98.6 kg)    Physical Exam Constitutional:      Appearance: Normal appearance. She is obese.  HENT:     Head: Normocephalic and atraumatic.  Eyes:     Pupils: Pupils are equal, round, and reactive to light.  Cardiovascular:     Rate and Rhythm: Normal rate and regular rhythm.      Heart sounds: Normal heart sounds. No murmur heard. Pulmonary:     Effort: Pulmonary effort is normal.     Breath sounds: Normal breath sounds. No wheezing.  Abdominal:     General: Bowel sounds are normal. There is no distension.     Palpations: Abdomen is soft.     Tenderness: There is no abdominal tenderness.  Musculoskeletal:        General: Normal range of motion.     Cervical back: Normal range of motion.  Skin:    General: Skin is warm and dry.     Findings: No rash.  Neurological:     Mental Status: She is alert and oriented to person, place, and time.  Psychiatric:        Judgment: Judgment normal.    LABORATORY DATA:  I have reviewed the data as listed Recent Results (  from the past 2160 hour(s))  Basic metabolic panel     Status: None   Collection Time: 05/24/21  2:02 PM  Result Value Ref Range   Sodium 139 135 - 145 mmol/L   Potassium 4.0 3.5 - 5.1 mmol/L   Chloride 106 98 - 111 mmol/L   CO2 28 22 - 32 mmol/L   Glucose, Bld 91 70 - 99 mg/dL    Comment: Glucose reference range applies only to samples taken after fasting for at least 8 hours.   BUN 16 6 - 20 mg/dL   Creatinine, Ser 9.60 0.44 - 1.00 mg/dL   Calcium 9.5 8.9 - 45.4 mg/dL   GFR, Estimated >09 >81 mL/min    Comment: (NOTE) Calculated using the CKD-EPI Creatinine Equation (2021)    Anion gap 5 5 - 15    Comment: Performed at [facility], [address] [place], [place] [address]  POCT glucose     Status: Abnormal   Collection Time: 05/31/21  9:15 AM  Result Value Ref Range   POC Glucose 165 (A) 70 - 99 mg/dl    Comment: nonfasting; 05/30/21 1:50 pm  Folate     Status: None   Collection Time: 06/14/21  1:42 PM  Result Value Ref Range   Folate 13.3 >5.9 ng/mL    Comment: Performed at [facility], [address] [place], [place] [address]  Vitamin B12     Status: None   Collection Time: 06/14/21  1:42 PM  Result Value Ref Range   Vitamin B-12 353 180 - 914 pg/mL     Comment: (NOTE) This assay is not validated for testing neonatal or myeloproliferative syndrome specimens for Vitamin B12 levels. Performed at [facility], [address], [place], [place] [address]   CBC with Differential/Platelet     Status: Abnormal   Collection Time: 06/14/21  1:42 PM  Result Value Ref Range   WBC 6.0 4.0 - 10.5 K/uL   RBC 4.85 3.87 - 5.11 MIL/uL   Hemoglobin 14.2 12.0 - 15.0 g/dL   HCT 65.7 84.6 - 96.2 %   MCV 89.7 80.0 - 100.0 fL   MCH 29.3 26.0 - 34.0 pg   MCHC 32.6 30.0 - 36.0 g/dL   RDW 95.2 84.1 - 32.4 %   Platelets 408 (H) 150 - 400 K/uL   nRBC 0.0 0.0 - 0.2 %   Neutrophils Relative % 61 %   Neutro Abs 3.7 1.7 - 7.7 K/uL   Lymphocytes Relative 31 %   Lymphs Abs 1.9 0.7 - 4.0 K/uL   Monocytes Relative 6 %   Monocytes Absolute 0.4 0.1 - 1.0 K/uL   Eosinophils Relative 1 %   Eosinophils Absolute 0.0 0.0 - 0.5 K/uL   Basophils Relative 1 %   Basophils Absolute 0.0 0.0 - 0.1 K/uL   Immature Granulocytes 0 %   Abs Immature Granulocytes 0.01 0.00 - 0.07 K/uL    Comment: Performed at [facility], [address] [place], [place] [address]  Iron and TIBC     Status: None   Collection Time: 06/14/21  1:42 PM  Result Value Ref Range   Iron 72 28 - 170 ug/dL   TIBC 725 366 - 440 ug/dL   Saturation Ratios 19 10.4 - 31.8 %   UIBC 316 ug/dL    Comment: Performed at [facility], [address] [place] [address], [place], [place] [address]  Ferritin     Status: None   Collection Time:  06/14/21  1:42 PM  Result Value Ref Range   Ferritin 115 11 - 307 ng/mL    Comment: Performed at [facility], [address] [place], [place] [address]    RADIOGRAPHIC STUDIES: I have personally reviewed the radiological images as listed and agreed with the findings in the report. NM Bone Scan 3 Phase  Result Date: 06/05/2021 CLINICAL DATA:  RIGHT knee pain and swelling for 5-6 months, no trauma. Partial RIGHT knee arthroplasty 10 years ago. EXAM: NUCLEAR  MEDICINE 3-PHASE BONE SCAN TECHNIQUE: Radionuclide angiographic images, immediate static blood pool images, and 3-hour delayed static images were obtained of the knees after intravenous injection of radiopharmaceutical. Concurrent CT imaging was also performed. Fused SPECT CT data sets are reviewed in 3 planes. RADIOPHARMACEUTICALS:  22.74 mCi Tc-93m MDP IV COMPARISON:  None Correlation: RIGHT knee radiographs 11/13/2020, concurrent CT imaging. FINDINGS: Vascular phase: Normal blood flow to both knees Blood pool phase: Normal blood pool at both knees Delayed phase: Photopenic defect at medial compartment RIGHT knee consistent with unicompartmental prosthesis, corresponding to metallic component on CT. Abnormal increased tracer uptake identified within RIGHT medial femoral condyle and RIGHT medial tibial plateau adjacent to prosthetic components consistent with aseptic loosening, infection not completely excluded. No significant abnormal tracer uptake is otherwise identified. CT images demonstrate joint space narrowing at the lateral compartment RIGHT knee and medial/lateral compartments LEFT knee. No other osseous abnormalities. 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 Electronically Signed   By: Ulyses Southward M.D.   On: 06/05/2021 14:04    ASSESSMENT/PLAN:  Low iron levels: Discovered recently due to significant fatigue and ice pica. Reports longstanding history of iron deficiency although she is not presently anemic. (possibly d/t underlying respiratory conditions). Unable to tolerate oral iron tablets. Denies any recent bleeding. Had colonoscopy in 2018 which showed gastritis and repeat in 2020 which was negative.  Etiology likely secondary to gastritis and malabsorption. She received 3 doses of Iv iron 02/26/21-03/02/21 for a ferritin of 33 and  iron saturation of 11%.  Labs from 06/14/21 show a ferritin 115, hemoglobin 14.2 and platelets 408,000. No additional iron needed at this time. F/u in 3 months.   Disposition-No iron today. RTC in 3 months for labs (cbc, ferritin, iron panel), see md and possible Iv iron.   No problem-specific Assessment & Plan notes found for this encounter.  I spent 25 minutes dedicated to the care of this patient (face-to-face and non-face-to-face) on the date of the encounter to include what is described in the assessment and plan.    Mauro Kaufmann, NP 06/15/21 12:13 PM

## 2021-06-14 NOTE — Patient Instructions (Signed)

## 2021-06-15 ENCOUNTER — Encounter: Payer: Self-pay | Admitting: Oncology

## 2021-06-15 LAB — COPPER, SERUM: Copper: 170 ug/dL — ABNORMAL HIGH (ref 80–158)

## 2021-06-18 ENCOUNTER — Other Ambulatory Visit: Payer: Self-pay | Admitting: *Deleted
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
## 2021-06-18 NOTE — Patient Instructions (Signed)
Goals Addressed               This Visit's Progress     (THN) Monitor and Manage My Blood Sugar Diabetes Type 2 (pt-stated)   On track     Timeframe:  Long-Range Goal Priority:  Medium Start Date:          25003704 Expected End Date:      88891694             Follow Up Date 50388828   - check blood sugar at prescribed times    Why is this important?   Checking your blood sugar at home helps to keep it from getting very high or very low.  Writing the results in a diary or log helps the doctor know how to care for you.  Your blood sugar log should have the time, date and the results.  Also, write down the amount of insulin or other medicine that you take.  Other information, like what you ate, exercise done and how you were feeling, will also be helpful.     Notes:  [id number] Patient is awaiting her new meter a dexacom [id number] Patient is monitoring blood sugars      (THN)Lifestyle Change-Hypertension   On track     Timeframe:  Long-Range Goal Priority:  Medium Start Date: 48016553                            Expected End Date:   74827078              Follow Up Date 67544920  - agree to work together to make changes - ask questions to understand - learn about high blood pressure    Why is this important?   The changes that you are asked to make may be hard to do.  This is especially true when the changes are life-long.  Knowing why it is important to you is the first step.  Working on the change with your family or support person helps you not feel alone.  Reward yourself and family or support person when goals are met. This can be an activity you choose like bowling, hiking, biking, swimming or shooting hoops.     Notes:  .[id number] RN encouraging patient to ask questions [phone] Patient is trying to eat healthier.      (THN)Track and Manage My Blood Pressure (pt-stated)   On track     Timeframe:  Long-Range Goal Priority:  High Start Date:       49826415                       Expected End Date:      83094076 Follow up [id number]            - check blood pressure daily - choose a place to take my blood pressure (home, clinic or office, retail store)    Why is this important?   You won't feel high blood pressure, but it can still hurt your blood vessels.  High blood pressure can cause heart or kidney problems. It can also cause a stroke.  Making lifestyle changes like losing a little weight or eating less salt will help.  Checking your blood pressure at home and at different times of the day can help to control blood pressure.  If the doctor prescribes medicine remember to take it the way the doctor ordered.  Call the  office if you cannot afford the medicine or if there are questions about it.     Notes: 10/16/20 reports BP improved WNL goal met [id number] patient blood pressure is uncontrolled [id number] Patient is monitoring her blood pressure/ Her goal is 130/80 and under

## 2021-06-20 NOTE — Progress Notes (Deleted)
Patient ID: Amanda [name], female    DOB: [birth year], 55 y.o.   MRN: [id number]  HPI  [name] is a 55 y/o female with a history of asthma, DM, HTN, stroke, thyroid disease, GERD, fibromyalgia, migraines, obstructive sleep apnea and chronic heart failure.   Echo report from 05/09/20 reviewed and showed an EF of 50-55% without LVH. Had echo done 10/04/2018 but unable to view those results. Echo report from 03/29/18 reviewed and showed an EF of 55-60%.  Has not been admitted or been in the ED in the last 6 months.   She presents today for a follow-up visit with a chief complaint of    She is drinking 3L of fluids daily because when she drinks the 2L / day, she ends up hypotensive and then in the ED or admitted.     Past Medical History:  Diagnosis Date   ADD (attention deficit disorder)    Anxiety    Arthritis    knees, shoulder, upper back   Asthma    CFS (chronic fatigue syndrome)    Chewing difficulty    CHF (congestive heart failure) (HCC)    Chronic kidney disease    Constipation    Depression    Depression    Diabetes mellitus without complication (HCC)    Dyspnea    Environmental allergies    Fatty liver    Fibromyalgia    GERD (gastroesophageal reflux disease)    GI bleed 10/02/2018   Headache    migraines - 5x/mo   Hypertension    Hypokalemia 10/14/2017   Hypothyroidism    IBS (irritable bowel syndrome)    IDA (iron deficiency anemia) 02/23/2021   Joint pain    Lower extremity edema    Major depressive disorder, single episode    Migraine without aura and without status migrainosus, not intractable    Migraines    Motion sickness    ships   Osteoarthritis    Other specified disorders of thyroid    Sleep apnea    Stroke (HCC)    no residual deficits   Swallowing difficulty    Thyroid nodule    bilateral and goiter   Vitamin D deficiency    Wears contact lenses    Wears dentures    partial upper   Past Surgical History:  Procedure Laterality Date    ABDOMINAL HYSTERECTOMY  2000's   ABDOMINAL HYSTERECTOMY     ABDOMINAL SURGERY     laparoscopy x4 with lysis of adhesions   APPENDECTOMY  1986   CESAREAN SECTION  1992   CHOLECYSTECTOMY N/A 04/03/2017   Procedure: LAPAROSCOPIC CHOLECYSTECTOMY;  Surgeon: Henrene Dodge, MD;  Location: [facility] ORS;  Service: General;  Laterality: N/A;   COLONOSCOPY N/A 10/06/2018   Procedure: COLONOSCOPY;  Surgeon: Toney Reil, MD;  Location: [facility] ENDOSCOPY;  Service: Gastroenterology;  Laterality: N/A;   COLONOSCOPY WITH PROPOFOL N/A 03/10/2017   Procedure: COLONOSCOPY WITH PROPOFOL;  Surgeon: Scot Jun, MD;  Location: [facility] ENDOSCOPY;  Service: Endoscopy;  Laterality: N/A;   ECTOPIC PREGNANCY SURGERY  2000's   ESOPHAGOGASTRODUODENOSCOPY (EGD) WITH PROPOFOL N/A 03/10/2017   Procedure: ESOPHAGOGASTRODUODENOSCOPY (EGD) WITH PROPOFOL;  Surgeon: Scot Jun, MD;  Location: [facility] ENDOSCOPY;  Service: Endoscopy;  Laterality: N/A;   HERNIA REPAIR     JOINT REPLACEMENT Right 12/16/12   knee- medial - makoplasty   KNEE ARTHROSCOPY Right 2006   partial medial and lateral meniscectomies   KNEE ARTHROSCOPY Right 10/06/2015   Procedure: RIGHT KNEE ARTHROSCOPY  WITH DEBRIDEMENT;  Surgeon: Erin Sons, MD;  Location: [facility];  Service: Orthopedics;  Laterality: Right;  Diabetic - insulin and oral meds   ROTATOR CUFF REPAIR Left 2006   TUBAL LIGATION  2000's   Family History  Problem Relation Age of Onset   Hypertension Mother    Diabetes Mother    Heart disease Mother    Depression Mother    Obesity Mother    Hypertension Father    Diabetes Father    Allergies Father    Kidney disease Father    Cancer Father    Breast cancer Neg Hx    Social History   Tobacco Use   Smoking status: Never   Smokeless tobacco: Never  Substance Use Topics   Alcohol use: Not Currently   Allergies  Allergen Reactions   Penicillins Anaphylaxis    Has patient had a PCN reaction causing immediate rash,  facial/tongue/throat swelling, SOB or lightheadedness with hypotension: Yes Has patient had a PCN reaction causing severe rash involving mucus membranes or skin necrosis: No Has patient had a PCN reaction that required hospitalization No Has patient had a PCN reaction occurring within the last 10 years: No If all of the above answers are "NO", then may proceed with Cephalosporin use.  Has patient had a PCN reaction causing immediate rash, facial/tongue/throat swelling, SOB or lightheadedness with hypotension: Yes Has patient had a PCN reaction causing severe rash involving mucus membranes or skin necrosis: No Has patient had a PCN reaction that required hospitalization No Has patient had a PCN reaction occurring within the last 10 years: No If all of the above answers are "NO", then may proceed with Cephalosporin use. Other reaction(s): Other (See Comments) Severe/almost died Other reaction(s): ANAPHYLAXIS Has patient had a PCN reaction causing immediate rash, facial/tongue/throat swelling, SOB or lightheadedness with hypotension: Yes Has patient had a PCN reaction causing severe rash involving mucus membranes or skin necrosis: No Has patient had a PCN reaction that required hospitalization No Has patient had a PCN reaction occurring within the last 10 years: No If all of the above answers are "NO", then may proceed with Cephalosporin use. Other reaction(s): Other (See Comments) Severe/almost died Other reaction(s): ANAPHYLAXIS Has patient had a PCN reaction causing immediate rash, facial/tongue/throat swelling, SOB or lightheadedness with hypotension: Yes Has patient had a PCN reaction causing severe rash involving mucus membranes or skin necrosis: No Has patient had a PCN reaction that required hospitalization No Has patient had a PCN reaction occurring within the last 10 years: No If all of the above answers are "NO", then may proceed with Cephalospo... (TRUNCATED)   Penicillins  Anaphylaxis   Ace Inhibitors Swelling   Ace Inhibitors Swelling    Review of Systems  Constitutional:  Positive for fatigue (with minimal exertion). Negative for appetite change.  HENT:  Negative for congestion and postnasal drip.   Eyes: Negative.   Respiratory:  Positive for cough ("little bit"), shortness of breath (with moderate exertion) and wheezing. Negative for chest tightness.   Cardiovascular:  Positive for chest pain ("little bit"), palpitations and leg swelling (both feet).  Gastrointestinal:  Positive for abdominal distention ("little bloated"). Negative for abdominal pain.  Endocrine: Negative.   Genitourinary: Negative.   Musculoskeletal:  Positive for arthralgias (right knee). Negative for back pain.  Skin: Negative.   Allergic/Immunologic: Negative.   Neurological:  Positive for light-headedness (sometimes). Negative for dizziness.  Hematological:  Negative for adenopathy. Does not bruise/bleed easily.  Psychiatric/Behavioral:  Positive for  sleep disturbance (sleeping on 2 pillows). Negative for dysphoric mood. The patient is not nervous/anxious.     Physical Exam Vitals and nursing note reviewed.  Constitutional:      Appearance: She is well-developed.  HENT:     Head: Normocephalic and atraumatic.  Neck:     Thyroid: No thyromegaly.  Cardiovascular:     Rate and Rhythm: Normal rate and regular rhythm.  Pulmonary:     Effort: Pulmonary effort is normal. No respiratory distress.     Breath sounds: No wheezing or rales.  Abdominal:     General: There is no distension.     Palpations: Abdomen is soft.  Musculoskeletal:     Cervical back: Normal range of motion and neck supple.     Right lower leg: No tenderness. Edema (nonpitting in foot) present.     Left lower leg: No tenderness. Edema (nonpitting in foot) present.  Skin:    General: Skin is warm and dry.  Neurological:     Mental Status: She is alert and oriented to person, place, and time.   Psychiatric:        Behavior: Behavior normal.   Assessment & Plan:  1: Chronic heart failure with preserved ejection fraction- - NYHA class III - euvolemic today - weighing daily and she was reminded to call for an overnight weight gain of >2 pounds or a weekly weight gain of >5 pounds - weight 216.2 from last visit here 1 month ago - follows with cardiology [name]) in the past but has been released from his practice due to NS; planning on getting established with cardiology at [facility] - not adding salt and trying to follow a low sodium diet - drinking ~ 3L of water daily as she ends up hypotensive if she only drinks 2L  - has compression socks but hasn't been wearing them daily; instructed to put them on every morning with removal at bedtime  - referral made to cardiac rehab - BNP 09/12/20 was 64.0   2: HTN- - BP  - saw PCP ([facility]) a few months ago - BMP 05/24/21 reviewed and showed sodium 139, potassium 4.0, creatinine 0.91 and GFR >60  3: DM- - A1c 09/13/20 was 7.9% - glucose at home today was  - saw endocrinology (Caraccio) 04/10/21 - saw nephrology Eulah Pont) 02/12/21  4: Asthma- - saw pulmonology [name]) 05/08/21   Patient did not bring her medications nor a list. Each medication was verbally reviewed with the patient and she was encouraged to bring the bottles to every visit to confirm accuracy of list.

## 2021-06-21 ENCOUNTER — Telehealth: Payer: Self-pay | Admitting: Family

## 2021-06-21 ENCOUNTER — Ambulatory Visit: Payer: Medicare Other | Admitting: Family

## 2021-06-21 NOTE — Telephone Encounter (Signed)
Even though patient confirmed appointment yesterday, she did not show for her [facility] Clinic appointment on 06/21/21. Will attempt to reschedule.

## 2021-06-22 ENCOUNTER — Ambulatory Visit: Payer: Medicare Other | Admitting: Cardiology

## 2021-07-09 NOTE — Progress Notes (Signed)
Pt rescheduled

## 2021-07-10 ENCOUNTER — Other Ambulatory Visit: Payer: Self-pay

## 2021-07-10 ENCOUNTER — Ambulatory Visit: Payer: Medicare Other | Admitting: Internal Medicine

## 2021-07-10 DIAGNOSIS — Z91199 Patient's noncompliance with other medical treatment and regimen due to unspecified reason: Secondary | ICD-10-CM

## 2021-07-11 ENCOUNTER — Encounter: Payer: Self-pay | Admitting: *Deleted

## 2021-07-11 ENCOUNTER — Encounter: Payer: Medicare Other | Attending: Internal Medicine | Admitting: *Deleted

## 2021-07-11 ENCOUNTER — Other Ambulatory Visit: Payer: Self-pay

## 2021-07-11 DIAGNOSIS — I5032 Chronic diastolic (congestive) heart failure: Secondary | ICD-10-CM

## 2021-07-11 NOTE — Progress Notes (Signed)
Initial telephone orientation completed. Diagnosis can be found in [facility] 9/22. EP orientation scheduled for Wednesday 11/23 at 9:30am.

## 2021-08-20 ENCOUNTER — Other Ambulatory Visit: Payer: Self-pay

## 2021-08-20 ENCOUNTER — Encounter: Payer: Medicare Other | Attending: Internal Medicine | Admitting: *Deleted

## 2021-08-20 VITALS — Ht 65.5 in | Wt 219.4 lb

## 2021-08-20 DIAGNOSIS — I5032 Chronic diastolic (congestive) heart failure: Secondary | ICD-10-CM | POA: Insufficient documentation

## 2021-08-20 NOTE — Patient Instructions (Signed)
Patient Instructions  Patient Details  Name: Amanda [name] MRN: [id number] Date of Birth: [birth year] Referring Provider:  Flora Lipps, MD  Below are your personal goals for exercise, nutrition, and risk factors. Our goal is to help you stay on track towards obtaining and maintaining these goals. We will be discussing your progress on these goals with you throughout the program.  Initial Exercise Prescription:  Initial Exercise Prescription - 08/20/21 1600       Date of Initial Exercise RX and Referring Provider   Date 08/20/21    Referring Provider Kasa      Oxygen   Maintain Oxygen Saturation 88% or higher      Treadmill   MPH 2    Grade 0    Minutes 15    METs 2.53      Recumbant Elliptical   Level 1    RPM 50    Minutes 15    METs 3.3      REL-XR   Level 1    Speed 50    Minutes 15    METs 3.3      Track   Laps 30    Minutes 15    METs 2.63      Prescription Details   Frequency (times per week) 3    Duration Progress to 30 minutes of continuous aerobic without signs/symptoms of physical distress      Intensity   THRR 40-80% of Max Heartrate 111-146    Ratings of Perceived Exertion 11-13    Perceived Dyspnea 0-4      Progression   Progression Continue to progress workloads to maintain intensity without signs/symptoms of physical distress.      Resistance Training   Training Prescription Yes    Weight 3    Reps 10-15             Exercise Goals: Frequency: Be able to perform aerobic exercise two to three times per week in program working toward 2-5 days per week of home exercise.  Intensity: Work with a perceived exertion of 11 (fairly light) - 15 (hard) while following your exercise prescription.  We will make changes to your prescription with you as you progress through the program.   Duration: Be able to do 30 to 45 minutes of continuous aerobic exercise in addition to a 5 minute warm-up and a 5 minute cool-down routine.   Nutrition  Goals: Your personal nutrition goals will be established when you do your nutrition analysis with the dietician.  The following are general nutrition guidelines to follow: Cholesterol < 200mg /day Sodium < 1500mg /day Fiber: Women over 50 yrs - 21 grams per day  Personal Goals:  Personal Goals and Risk Factors at Admission - 08/20/21 1633       Core Components/Risk Factors/Patient Goals on Admission    Weight Management Yes;Weight Loss    Intervention Weight Management: Provide education and appropriate resources to help participant work on and attain dietary goals.;Weight Management: Develop a combined nutrition and exercise program designed to reach desired caloric intake, while maintaining appropriate intake of nutrient and fiber, sodium and fats, and appropriate energy expenditure required for the weight goal.    Admit Weight 219 lb 6.4 oz (99.5 kg)    Goal Weight: Short Term 200 lb (90.7 kg)    Goal Weight: Long Term 145 lb (65.8 kg)    Expected Outcomes Long Term: Adherence to nutrition and physical activity/exercise program aimed toward attainment of established weight goal;Short Term: Continue to assess  and modify interventions until short term weight is achieved;Weight Loss: Understanding of general recommendations for a balanced deficit meal plan, which promotes 1-2 lb weight loss per week and includes a negative energy balance of [phone] kcal/d;Understanding recommendations for meals to include 15-35% energy as protein, 25-35% energy from fat, 35-60% energy from carbohydrates, less than 200mg  of dietary cholesterol, 20-35 gm of total fiber daily;Understanding of distribution of calorie intake throughout the day with the consumption of 4-5 meals/snacks    Improve shortness of breath with ADL's Yes    Intervention Provide education, individualized exercise plan and daily activity instruction to help decrease symptoms of SOB with activities of daily living.    Expected Outcomes Short  Term: Improve cardiorespiratory fitness to achieve a reduction of symptoms when performing ADLs;Long Term: Be able to perform more ADLs without symptoms or delay the onset of symptoms    Increase knowledge of respiratory medications and ability to use respiratory devices properly  Yes    Intervention Provide education and demonstration as needed of appropriate use of medications, inhalers, and oxygen therapy.    Expected Outcomes Short Term: Achieves understanding of medications use. Understands that oxygen is a medication prescribed by physician. Demonstrates appropriate use of inhaler and oxygen therapy.    Diabetes Yes    Intervention Provide education about signs/symptoms and action to take for hypo/hyperglycemia.;Provide education about proper nutrition, including hydration, and aerobic/resistive exercise prescription along with prescribed medications to achieve blood glucose in normal ranges: Fasting glucose 65-99 mg/dL    Expected Outcomes Short Term: Participant verbalizes understanding of the signs/symptoms and immediate care of hyper/hypoglycemia, proper foot care and importance of medication, aerobic/resistive exercise and nutrition plan for blood glucose control.;Long Term: Attainment of HbA1C < 7%.    Hypertension Yes    Intervention Provide education on lifestyle modifcations including regular physical activity/exercise, weight management, moderate sodium restriction and increased consumption of fresh fruit, vegetables, and low fat dairy, alcohol moderation, and smoking cessation.;Monitor prescription use compliance.    Expected Outcomes Short Term: Continued assessment and intervention until BP is < 140/60mm HG in hypertensive participants. < 130/79mm HG in hypertensive participants with diabetes, heart failure or chronic kidney disease.;Long Term: Maintenance of blood pressure at goal levels.    Lipids Yes    Intervention Provide education and support for participant on nutrition &  aerobic/resistive exercise along with prescribed medications to achieve LDL 70mg , HDL >40mg .    Expected Outcomes Short Term: Participant states understanding of desired cholesterol values and is compliant with medications prescribed. Participant is following exercise prescription and nutrition guidelines.;Long Term: Cholesterol controlled with medications as prescribed, with individualized exercise RX and with personalized nutrition plan. Value goals: LDL < 70mg , HDL > 40 mg.             Tobacco Use Initial Evaluation: Social History   Tobacco Use  Smoking Status Never  Smokeless Tobacco Never    Exercise Goals and Review:  Exercise Goals     Row Name 08/20/21 1631             Exercise Goals   Increase Physical Activity Yes       Intervention Develop an individualized exercise prescription for aerobic and resistive training based on initial evaluation findings, risk stratification, comorbidities and participant's personal goals.;Provide advice, education, support and counseling about physical activity/exercise needs.       Expected Outcomes Short Term: Attend rehab on a regular basis to increase amount of physical activity.;Long Term: Add in home exercise to  make exercise part of routine and to increase amount of physical activity.;Long Term: Exercising regularly at least 3-5 days a week.       Increase Strength and Stamina Yes       Intervention Provide advice, education, support and counseling about physical activity/exercise needs.;Develop an individualized exercise prescription for aerobic and resistive training based on initial evaluation findings, risk stratification, comorbidities and participant's personal goals.       Expected Outcomes Short Term: Increase workloads from initial exercise prescription for resistance, speed, and METs.;Short Term: Perform resistance training exercises routinely during rehab and add in resistance training at home;Long Term: Improve  cardiorespiratory fitness, muscular endurance and strength as measured by increased METs and functional capacity (6MWT)       Able to understand and use rate of perceived exertion (RPE) scale Yes       Intervention Provide education and explanation on how to use RPE scale       Expected Outcomes Short Term: Able to use RPE daily in rehab to express subjective intensity level;Long Term:  Able to use RPE to guide intensity level when exercising independently       Able to understand and use Dyspnea scale Yes       Intervention Provide education and explanation on how to use Dyspnea scale       Expected Outcomes Short Term: Able to use Dyspnea scale daily in rehab to express subjective sense of shortness of breath during exertion;Long Term: Able to use Dyspnea scale to guide intensity level when exercising independently       Knowledge and understanding of Target Heart Rate Range (THRR) Yes       Intervention Provide education and explanation of THRR including how the numbers were predicted and where they are located for reference       Expected Outcomes Short Term: Able to state/look up THRR;Long Term: Able to use THRR to govern intensity when exercising independently;Short Term: Able to use daily as guideline for intensity in rehab       Able to check pulse independently Yes       Intervention Provide education and demonstration on how to check pulse in carotid and radial arteries.;Review the importance of being able to check your own pulse for safety during independent exercise       Expected Outcomes Short Term: Able to explain why pulse checking is important during independent exercise;Long Term: Able to check pulse independently and accurately       Understanding of Exercise Prescription Yes       Intervention Provide education, explanation, and written materials on patient's individual exercise prescription       Expected Outcomes Short Term: Able to explain program exercise prescription;Long  Term: Able to explain home exercise prescription to exercise independently                Copy of goals given to participant.

## 2021-08-20 NOTE — Progress Notes (Signed)
Pulmonary Individual Treatment Plan  Patient Details  Name: Amanda [name] MRN: [id number] Date of Birth: 1966-03-29 Referring Provider:   Flowsheet Row Pulmonary Rehab from 08/20/2021 in [facility] Cardiac and Pulmonary Rehab  Referring Provider Kasa       Initial Encounter Date:  Flowsheet Row Pulmonary Rehab from 08/20/2021 in [facility] Cardiac and Pulmonary Rehab  Date 08/20/21       Visit Diagnosis: Chronic diastolic HF (heart failure) ([place])  Patient's Home Medications on Admission:  Current Outpatient Medications:    ACCU-CHEK GUIDE test strip, , Disp: , Rfl:    albuterol (VENTOLIN HFA) 108 (90 Base) MCG/ACT inhaler, Inhale 2 puffs into the lungs every 6 (six) hours as needed for wheezing or shortness of breath., Disp: 8 g, Rfl: 6   Ascorbic Acid (VITAMIN C CR) 1000 MG TBCR, Take 1 tablet by mouth daily., Disp: , Rfl:    B-D UF III MINI PEN NEEDLES 31G X 5 MM MISC, Inject into the skin daily., Disp: , Rfl:    BD PEN NEEDLE MICRO U/F 32G X 6 MM MISC, Inject into the skin., Disp: , Rfl:    buprenorphine (BUTRANS) 10 MCG/HR PTWK, , Disp: , Rfl:    cholecalciferol (VITAMIN D) 25 MCG (1000 UNIT) tablet, Take 1,000 Units by mouth daily., Disp: , Rfl:    desvenlafaxine (PRISTIQ) 50 MG 24 hr tablet, Take 50 mg by mouth daily., Disp: , Rfl:    Dexmethylphenidate HCl 25 MG CP24, Take 1 capsule by mouth every morning., Disp: , Rfl:    estradiol (ESTRACE) 0.1 MG/GM vaginal cream, , Disp: , Rfl:    ferrous sulfate 324 MG TBEC, Take 324 mg by mouth daily. (Patient not taking: Reported on 06/14/2021), Disp: , Rfl:    flunisolide (NASALIDE) 25 MCG/ACT (0.025%) SOLN, Place into the nose. (Patient not taking: Reported on 06/14/2021), Disp: , Rfl:    furosemide (LASIX) 40 MG tablet, Take 1 tablet (40 mg total) by mouth 2 (two) times daily., Disp: 180 tablet, Rfl: 3   HUMALOG KWIKPEN 100 UNIT/ML KwikPen, Inject into the skin., Disp: , Rfl:    hydrocortisone 2.5 % ointment, Apply topically 2 (two) times  daily., Disp: , Rfl:    hydrOXYzine (ATARAX/VISTARIL) 25 MG tablet, , Disp: , Rfl:    INPEN 100-GRAY-LILLY DEVI, as directed., Disp: , Rfl:    Insulin Pen Needle 31G X 6 MM MISC, SMARTSIG:1 Needle SUB-Q 4 Times Daily, Disp: , Rfl:    ipratropium-albuterol (DUONEB) 0.5-2.5 (3) MG/3ML SOLN, Inhale into the lungs., Disp: , Rfl:    JARDIANCE 25 MG TABS tablet, Take 25 mg by mouth daily., Disp: , Rfl:    LATUDA 40 MG TABS tablet, Take 40 mg by mouth at bedtime. (Patient not taking: Reported on 06/14/2021), Disp: , Rfl:    lidocaine (LIDODERM) 5 %, lidocaine 5 % topical patch  PLEASE SEE ATTACHED FOR DETAILED DIRECTIONS (Patient not taking: Reported on 06/14/2021), Disp: , Rfl:    LINZESS 290 MCG CAPS capsule, Take 290 mcg by mouth daily., Disp: , Rfl:    LORazepam (ATIVAN) 1 MG tablet, Take by mouth., Disp: , Rfl:    losartan (COZAAR) 25 MG tablet, Take 25 mg by mouth daily., Disp: , Rfl:    mirtazapine (REMERON SOL-TAB) 30 MG disintegrating tablet, Take 30 mg by mouth at bedtime as needed (Nap). , Disp: , Rfl: 2   montelukast (SINGULAIR) 10 MG tablet, Take 10 mg by mouth daily., Disp: , Rfl:    nabumetone (RELAFEN) 500 MG tablet,  Take by mouth., Disp: , Rfl:    NOVOLOG FLEXPEN 100 UNIT/ML FlexPen, Inject 10 Units into the skin daily. (Patient not taking: Reported on 06/14/2021), Disp: , Rfl:    omeprazole (PRILOSEC) 40 MG capsule, Take 40 mg by mouth daily., Disp: , Rfl:    potassium chloride SA (KLOR-CON) 20 MEQ tablet, Take 1 tablet (20 mEq total) by mouth daily., Disp: 90 tablet, Rfl: 3   pravastatin (PRAVACHOL) 40 MG tablet, Take 40 mg by mouth daily., Disp: , Rfl:    pregabalin (LYRICA) 150 MG capsule, Take 150 mg by mouth 3 (three) times daily., Disp: , Rfl:    PROAIR RESPICLICK 108 (90 Base) MCG/ACT AEPB, Inhale 2 puffs into the lungs every 4 (four) hours as needed., Disp: , Rfl:    propranolol ER (INDERAL LA) 80 MG 24 hr capsule, Take 1 capsule (80 mg total) by mouth daily., Disp: 90 capsule,  Rfl: 3   Semaglutide, 1 MG/DOSE, (OZEMPIC, 1 MG/DOSE,) 2 MG/1.5ML SOPN, Inject into the skin., Disp: , Rfl:    SUMAtriptan (IMITREX) 50 MG tablet, Take 1 tablet (50 mg total) by mouth every 2 (two) hours as needed for migraine. May repeat in 2 hours if headache persists or recurs.  Not to exceed 4 pills in a 24-hour period.  If you find you need more medication and you have taken 4 pills, please go directly to the emergency room or call 911. (Patient not taking: Reported on 06/14/2021), Disp: 10 tablet, Rfl: 0   tiotropium (SPIRIVA HANDIHALER) 18 MCG inhalation capsule, Place 1 capsule (18 mcg total) into inhaler and inhale daily., Disp: 30 capsule, Rfl: 6   tiZANidine (ZANAFLEX) 4 MG tablet, Take 4 mg by mouth every 8 (eight) hours as needed for muscle spasms. , Disp: , Rfl:    topiramate (TOPAMAX) 100 MG tablet, Take 100 mg by mouth 2 (two) times daily., Disp: , Rfl:    TRESIBA FLEXTOUCH 200 UNIT/ML FlexTouch Pen, 72 units per day seems very high for you.  Please follow up with your outpatient provider for further adjustment., Disp: , Rfl:    triamcinolone ointment (KENALOG) 0.1 %, Apply topically., Disp: , Rfl:    TRUEPLUS PEN NEEDLES 31G X 6 MM MISC, , Disp: , Rfl:    VRAYLAR 6 MG CAPS, Take 1 capsule by mouth daily., Disp: , Rfl:    zolpidem (AMBIEN) 10 MG tablet, Take 10 mg by mouth at bedtime., Disp: , Rfl:   Past Medical History: Past Medical History:  Diagnosis Date   ADD (attention deficit disorder)    Anxiety    Arthritis    knees, shoulder, upper back   Asthma    CFS (chronic fatigue syndrome)    Chewing difficulty    CHF (congestive heart failure) (HCC)    Chronic kidney disease    Constipation    Depression    Depression    Diabetes mellitus without complication (HCC)    Dyspnea    Environmental allergies    Fatty liver    Fibromyalgia    GERD (gastroesophageal reflux disease)    GI bleed 10/02/2018   Headache    migraines - 5x/mo   Hypertension    Hypokalemia  10/14/2017   Hypothyroidism    IBS (irritable bowel syndrome)    IDA (iron deficiency anemia) 02/23/2021   Joint pain    Lower extremity edema    Major depressive disorder, single episode    Migraine without aura and without status migrainosus, not intractable  Migraines    Motion sickness    ships   Osteoarthritis    Other specified disorders of thyroid    Sleep apnea    Stroke (HCC)    no residual deficits   Swallowing difficulty    Thyroid nodule    bilateral and goiter   Vitamin D deficiency    Wears contact lenses    Wears dentures    partial upper    Tobacco Use: Social History   Tobacco Use  Smoking Status Never  Smokeless Tobacco Never    Labs: Recent Review Flowsheet Data     Labs for ITP Cardiac and Pulmonary Rehab Latest Ref Rng & Units 08/05/2019 10/19/2019 01/11/2020 05/08/2020 09/13/2020   Cholestrol 0 - 200 mg/dL - 245(H) - 154 -   LDLCALC 0 - 99 mg/dL - 118(H) - 76 -   HDL >40 mg/dL - 87 - 57 -   Trlycerides <150 mg/dL - 237(H) - 105 -   Hemoglobin A1c 4.8 - 5.6 % 11.7(H) - 8.7(H) 8.4(H) 7.9(H)   HCO3 20.0 - 28.0 mmol/L - - - - -   ACIDBASEDEF 0.0 - 2.0 mmol/L - - - - -   O2SAT % - - - - -        Pulmonary Assessment Scores:  Pulmonary Assessment Scores     Row Name 08/20/21 1641         ADL UCSD   SOB Score total 90     Rest 3     Walk 4     Stairs 4     Bath 4     Dress 3     Shop 4       CAT Score   CAT Score 24       mMRC Score   mMRC Score 3              UCSD: Self-administered rating of dyspnea associated with activities of daily living (ADLs) 6-point scale (0 = "not at all" to 5 = "maximal or unable to do because of breathlessness")  Scoring Scores range from 0 to 120.  Minimally important difference is 5 units  CAT: CAT can identify the health impairment of COPD patients and is better correlated with disease progression.  CAT has a scoring range of zero to 40. The CAT score is classified into four groups of low  (less than 10), medium (10 - 20), high (21-30) and very high (31-40) based on the impact level of disease on health status. A CAT score over 10 suggests significant symptoms.  A worsening CAT score could be explained by an exacerbation, poor medication adherence, poor inhaler technique, or progression of COPD or comorbid conditions.  CAT MCID is 2 points  mMRC: mMRC (Modified Medical Research Council) Dyspnea Scale is used to assess the degree of baseline functional disability in patients of respiratory disease due to dyspnea. No minimal important difference is established. A decrease in score of 1 point or greater is considered a positive change.   Pulmonary Function Assessment:   Exercise Target Goals: Exercise Program Goal: Individual exercise prescription set using results from initial 6 min walk test and THRR while considering  patients activity barriers and safety.   Exercise Prescription Goal: Initial exercise prescription builds to 30-45 minutes a day of aerobic activity, 2-3 days per week.  Home exercise guidelines will be given to patient during program as part of exercise prescription that the participant will acknowledge.  Education: Aerobic Exercise: - Group verbal  and visual presentation on the components of exercise prescription. Introduces F.I.T.T principle from ACSM for exercise prescriptions.  Reviews F.I.T.T. principles of aerobic exercise including progression. Written material given at graduation.   Education: Resistance Exercise: - Group verbal and visual presentation on the components of exercise prescription. Introduces F.I.T.T principle from ACSM for exercise prescriptions  Reviews F.I.T.T. principles of resistance exercise including progression. Written material given at graduation.    Education: Exercise & Equipment Safety: - Individual verbal instruction and demonstration of equipment use and safety with use of the equipment. Flowsheet Row Pulmonary Rehab from  08/20/2021 in [facility] Cardiac and Pulmonary Rehab  Date 08/20/21  Educator [facility] [address]  Instruction Review Code 1- Verbalizes Understanding       Education: Exercise Physiology & General Exercise Guidelines: - Group verbal and written instruction with models to review the exercise physiology of the cardiovascular system and associated critical values. Provides general exercise guidelines with specific guidelines to those with heart or lung disease.    Education: Flexibility, Balance, Mind/Body Relaxation: - Group verbal and visual presentation with interactive activity on the components of exercise prescription. Introduces F.I.T.T principle from ACSM for exercise prescriptions. Reviews F.I.T.T. principles of flexibility and balance exercise training including progression. Also discusses the mind body connection.  Reviews various relaxation techniques to help reduce and manage stress (i.e. Deep breathing, progressive muscle relaxation, and visualization). Balance handout provided to take home. Written material given at graduation.   Activity Barriers & Risk Stratification:  Activity Barriers & Cardiac Risk Stratification - 07/11/21 1142       Activity Barriers & Cardiac Risk Stratification   Activity Barriers Shortness of Breath;Fibromyalgia;Joint Problems;Deconditioning;Muscular Weakness             6 Minute Walk:  6 Minute Walk     Row Name 08/20/21 1614         6 Minute Walk   Phase Initial     Distance 1210 feet     Walk Time 6 minutes     # of Rest Breaks 0     MPH 2.29     METS 3.32     RPE 14     Perceived Dyspnea  3     VO2 Peak 11.63     Symptoms No     Resting HR 77 bpm     Resting BP 122/72     Resting Oxygen Saturation  97 %     Exercise Oxygen Saturation  during 6 min walk 91 %     Max Ex. HR 122 bpm     Max Ex. BP 142/82     2 Minute Post BP 130/82       Interval HR   1 Minute HR 108     2 Minute HR 113     3 Minute HR 113     4 Minute HR 113     5 Minute  HR 119     6 Minute HR 122     2 Minute Post HR 72     Interval Heart Rate? Yes       Interval Oxygen   Interval Oxygen? Yes     Baseline Oxygen Saturation % 97 %     1 Minute Oxygen Saturation % 91 %     1 Minute Liters of Oxygen 0 L     2 Minute Oxygen Saturation % 94 %     2 Minute Liters of Oxygen 0 L     3 Minute Oxygen  Saturation % 94 %     3 Minute Liters of Oxygen 0 L     4 Minute Oxygen Saturation % 94 %     4 Minute Liters of Oxygen 0 L     5 Minute Oxygen Saturation % 95 %     5 Minute Liters of Oxygen 0 L     6 Minute Oxygen Saturation % 95 %     6 Minute Liters of Oxygen 0 L     2 Minute Post Oxygen Saturation % 95 %     2 Minute Post Liters of Oxygen 122 L             Oxygen Initial Assessment:  Oxygen Initial Assessment - 07/11/21 1142       Home Oxygen   Home Oxygen Device None    Sleep Oxygen Prescription CPAP    Home Exercise Oxygen Prescription None    Home Resting Oxygen Prescription None      Intervention   Short Term Goals To learn and demonstrate proper use of respiratory medications;To learn and demonstrate proper pursed lip breathing techniques or other breathing techniques. ;To learn and understand importance of maintaining oxygen saturations>88%;To learn and understand importance of monitoring SPO2 with pulse oximeter and demonstrate accurate use of the pulse oximeter.    Long  Term Goals Verbalizes importance of monitoring SPO2 with pulse oximeter and return demonstration;Maintenance of O2 saturations>88%;Exhibits proper breathing techniques, such as pursed lip breathing or other method taught during program session;Compliance with respiratory medication;Demonstrates proper use of MDIs             Oxygen Re-Evaluation:   Oxygen Discharge (Final Oxygen Re-Evaluation):   Initial Exercise Prescription:  Initial Exercise Prescription - 08/20/21 1600       Date of Initial Exercise RX and Referring Provider   Date 08/20/21     Referring Provider Kasa      Oxygen   Maintain Oxygen Saturation 88% or higher      Treadmill   MPH 2    Grade 0    Minutes 15    METs 2.53      Recumbant Elliptical   Level 1    RPM 50    Minutes 15    METs 3.3      REL-XR   Level 1    Speed 50    Minutes 15    METs 3.3      Track   Laps 30    Minutes 15    METs 2.63      Prescription Details   Frequency (times per week) 3    Duration Progress to 30 minutes of continuous aerobic without signs/symptoms of physical distress      Intensity   THRR 40-80% of Max Heartrate 111-146    Ratings of Perceived Exertion 11-13    Perceived Dyspnea 0-4      Progression   Progression Continue to progress workloads to maintain intensity without signs/symptoms of physical distress.      Resistance Training   Training Prescription Yes    Weight 3    Reps 10-15             Perform Capillary Blood Glucose checks as needed.  Exercise Prescription Changes:   Exercise Prescription Changes     Row Name 08/20/21 1600             Response to Exercise   Blood Pressure (Admit) 122/72       Blood Pressure (  Exercise) 142/82       Blood Pressure (Exit) 130/82       Heart Rate (Admit) 77 bpm       Heart Rate (Exercise) 122 bpm       Heart Rate (Exit) 72 bpm       Oxygen Saturation (Admit) 97 %       Oxygen Saturation (Exercise) 91 %       Oxygen Saturation (Exit) 95 %       Rating of Perceived Exertion (Exercise) 14       Perceived Dyspnea (Exercise) 3       Symptoms none       Comments 6 min walk results                Exercise Comments:   Exercise Goals and Review:   Exercise Goals     Row Name 08/20/21 1631             Exercise Goals   Increase Physical Activity Yes       Intervention Develop an individualized exercise prescription for aerobic and resistive training based on initial evaluation findings, risk stratification, comorbidities and participant's personal goals.;Provide advice, education,  support and counseling about physical activity/exercise needs.       Expected Outcomes Short Term: Attend rehab on a regular basis to increase amount of physical activity.;Long Term: Add in home exercise to make exercise part of routine and to increase amount of physical activity.;Long Term: Exercising regularly at least 3-5 days a week.       Increase Strength and Stamina Yes       Intervention Provide advice, education, support and counseling about physical activity/exercise needs.;Develop an individualized exercise prescription for aerobic and resistive training based on initial evaluation findings, risk stratification, comorbidities and participant's personal goals.       Expected Outcomes Short Term: Increase workloads from initial exercise prescription for resistance, speed, and METs.;Short Term: Perform resistance training exercises routinely during rehab and add in resistance training at home;Long Term: Improve cardiorespiratory fitness, muscular endurance and strength as measured by increased METs and functional capacity ( )       Able to understand and use rate of perceived exertion (RPE) scale Yes       Intervention Provide education and explanation on how to use RPE scale       Expected Outcomes Short Term: Able to use RPE daily in rehab to express subjective intensity level;Long Term:  Able to use RPE to guide intensity level when exercising independently       Able to understand and use Dyspnea scale Yes       Intervention Provide education and explanation on how to use Dyspnea scale       Expected Outcomes Short Term: Able to use Dyspnea scale daily in rehab to express subjective sense of shortness of breath during exertion;Long Term: Able to use Dyspnea scale to guide intensity level when exercising independently       Knowledge and understanding of Target Heart Rate Range (THRR) Yes       Intervention Provide education and explanation of THRR including how the numbers were predicted  and where they are located for reference       Expected Outcomes Short Term: Able to state/look up THRR;Long Term: Able to use THRR to govern intensity when exercising independently;Short Term: Able to use daily as guideline for intensity in rehab       Able to check pulse independently Yes  Intervention Provide education and demonstration on how to check pulse in carotid and radial arteries.;Review the importance of being able to check your own pulse for safety during independent exercise       Expected Outcomes Short Term: Able to explain why pulse checking is important during independent exercise;Long Term: Able to check pulse independently and accurately       Understanding of Exercise Prescription Yes       Intervention Provide education, explanation, and written materials on patient's individual exercise prescription       Expected Outcomes Short Term: Able to explain program exercise prescription;Long Term: Able to explain home exercise prescription to exercise independently                Exercise Goals Re-Evaluation :   Discharge Exercise Prescription (Final Exercise Prescription Changes):  Exercise Prescription Changes - 08/20/21 1600       Response to Exercise   Blood Pressure (Admit) 122/72    Blood Pressure (Exercise) 142/82    Blood Pressure (Exit) 130/82    Heart Rate (Admit) 77 bpm    Heart Rate (Exercise) 122 bpm    Heart Rate (Exit) 72 bpm    Oxygen Saturation (Admit) 97 %    Oxygen Saturation (Exercise) 91 %    Oxygen Saturation (Exit) 95 %    Rating of Perceived Exertion (Exercise) 14    Perceived Dyspnea (Exercise) 3    Symptoms none    Comments 6 min walk results             Nutrition:  Target Goals: Understanding of nutrition guidelines, daily intake of sodium 1500mg , cholesterol 200mg , calories 30% from fat and 7% or less from saturated fats, daily to have 5 or more servings of fruits and vegetables.  Education: All About Nutrition: -Group  instruction provided by verbal, written material, interactive activities, discussions, models, and posters to present general guidelines for heart healthy nutrition including fat, fiber, MyPlate, the role of sodium in heart healthy nutrition, utilization of the nutrition label, and utilization of this knowledge for meal planning. Follow up email sent as well. Written material given at graduation.   Biometrics:  Pre Biometrics - 08/20/21 1632       Pre Biometrics   Height 5' 5.5" (1.664 m)    Weight 219 lb 6.4 oz (99.5 kg)    BMI (Calculated) 35.94    Single Leg Stand 21.22 seconds              Nutrition Therapy Plan and Nutrition Goals:  Nutrition Therapy & Goals - 08/20/21 1635       Intervention Plan   Intervention Prescribe, educate and counsel regarding individualized specific dietary modifications aiming towards targeted core components such as weight, hypertension, lipid management, diabetes, heart failure and other comorbidities.    Expected Outcomes Short Term Goal: Understand basic principles of dietary content, such as calories, fat, sodium, cholesterol and nutrients.;Short Term Goal: A plan has been developed with personal nutrition goals set during dietitian appointment.;Long Term Goal: Adherence to prescribed nutrition plan.             Nutrition Assessments:  MEDIFICTS Score Key: ?70 Need to make dietary changes  40-70 Heart Healthy Diet ? 40 Therapeutic Level Cholesterol Diet  Flowsheet Row Pulmonary Rehab from 08/20/2021 in [facility] Cardiac and Pulmonary Rehab  Picture Your Plate Total Score on Admission 49      Picture Your Plate Scores: [id number] Unhealthy dietary pattern with much room for improvement. 41-50 Dietary  pattern unlikely to meet recommendations for good health and room for improvement. 51-60 More healthful dietary pattern, with some room for improvement.  >60 Healthy dietary pattern, although there may be some specific behaviors that could be  improved.   Nutrition Goals Re-Evaluation:   Nutrition Goals Discharge (Final Nutrition Goals Re-Evaluation):   Psychosocial: Target Goals: Acknowledge presence or absence of significant depression and/or stress, maximize coping skills, provide positive support system. Participant is able to verbalize types and ability to use techniques and skills needed for reducing stress and depression.   Education: Stress, Anxiety, and Depression - Group verbal and visual presentation to define topics covered.  Reviews how body is impacted by stress, anxiety, and depression.  Also discusses healthy ways to reduce stress and to treat/manage anxiety and depression.  Written material given at graduation.   Education: Sleep Hygiene -Provides group verbal and written instruction about how sleep can affect your health.  Define sleep hygiene, discuss sleep cycles and impact of sleep habits. Review good sleep hygiene tips.    Initial Review & Psychosocial Screening:  Initial Psych Review & Screening - 07/11/21 1153       Initial Review   Current issues with Current Sleep Concerns;Current Stress Concerns;History of Depression    Source of Stress Concerns Chronic Illness      Family Dynamics   Good Support System? Yes      Screening Interventions   Interventions Encouraged to exercise;To provide support and resources with identified psychosocial needs;Provide feedback about the scores to participant    Expected Outcomes Long Term Goal: Stressors or current issues are controlled or eliminated.;Short Term goal: Utilizing psychosocial counselor, staff and physician to assist with identification of specific Stressors or current issues interfering with healing process. Setting desired goal for each stressor or current issue identified.;Short Term goal: Identification and review with participant of any Quality of Life or Depression concerns found by scoring the questionnaire.;Long Term goal: The participant  improves quality of Life and PHQ9 Scores as seen by post scores and/or verbalization of changes             Quality of Life Scores:  Scores of 19 and below usually indicate a poorer quality of life in these areas.  A difference of  2-3 points is a clinically meaningful difference.  A difference of 2-3 points in the total score of the Quality of Life Index has been associated with significant improvement in overall quality of life, self-image, physical symptoms, and general health in studies assessing change in quality of life.  PHQ-9: Recent Review Flowsheet Data     Depression screen [facility] 2/9 08/20/2021 11/16/2020 10/16/2020 07/07/2020 10/19/2019   Decreased Interest 1 0 0 1 3   Down, Depressed, Hopeless 1 0 0 0 3   PHQ - 2 Score 2 0 0 1 6   Altered sleeping 3 - - - 3   Tired, decreased energy 1 - - - 3   Change in appetite 2 - - - 3   Feeling bad or failure about yourself  3 - - - 2   Trouble concentrating 3 - - - 2   Moving slowly or fidgety/restless 0 - - - 3   Suicidal thoughts 0 - - - 2   PHQ-9 Score 14 - - - 24   Difficult doing work/chores Extremely dIfficult - - - Very difficult      Interpretation of Total Score  Total Score Depression Severity:  1-4 = Minimal depression, 5-9 = Mild  depression, 10-14 = Moderate depression, 15-19 = Moderately severe depression, 20-27 = Severe depression   Psychosocial Evaluation and Intervention:  Psychosocial Evaluation - 07/11/21 1156       Psychosocial Evaluation & Interventions   Interventions Encouraged to exercise with the program and follow exercise prescription;Relaxation education    Comments Ms. Gilford Rile has multiple health issues including sleep apnea, asthma, diabetes, and heart failure. She states she feels like her symptoms are being addressed. She does continue to have insomnia issues which medication helps some, but most nights are restless. She has a history of PTSD as well. She has had therapy in the past and knows when to  reach out to address worsening issues/symptoms. She is coming to the program with her daughter who also has diastolic heart failure in hopes to get back on track. They both came to the program in 2018 but were unable to complete due to work scheduling.    Expected Outcomes Short: attend pulmonary rehab for education and exercise. Long: develop and maintain positive self care habits.    Continue Psychosocial Services  Follow up required by staff             Psychosocial Re-Evaluation:   Psychosocial Discharge (Final Psychosocial Re-Evaluation):   Education: Education Goals: Education classes will be provided on a weekly basis, covering required topics. Participant will state understanding/return demonstration of topics presented.  Learning Barriers/Preferences:  Learning Barriers/Preferences - 07/11/21 1153       Learning Barriers/Preferences   Learning Barriers None    Learning Preferences None             General Pulmonary Education Topics:  Infection Prevention: - Provides verbal and written material to individual with discussion of infection control including proper hand washing and proper equipment cleaning during exercise session. Flowsheet Row Pulmonary Rehab from 08/20/2021 in [facility] Pulmonary Re[facility]  Date 08/20/21  Educator [facility]  Instruction Review Code 1- Verbalizes Understanding       Falls Prevention: - Provides verbal and written material to individual with discussion of falls prevention and safety. Flowsheet Row Pulmonary Rehab from 08/20/2021 in [facility] Cardiac and Pulmonary Rehab  Date 08/20/21  Educator [facility]  Instruction Review Code 1- Verbalizes Understanding       Chronic Lung Disease Review: - Group verbal instruction with posters, models, PowerPoint presentations and videos,  to review new updates, new respiratory medications, new advancements in procedures and treatments. Providing information on websites and "800" numbers for continued  self-education. Includes information about supplement oxygen, available portable oxygen systems, continuous and intermittent flow rates, oxygen safety, concentrators, and Medicare reimbursement for oxygen. Explanation of Pulmonary Drugs, including class, frequency, complications, importance of spacers, rinsing mouth after steroid MDI's, and proper cleaning methods for nebulizers. Review of basic lung anatomy and physiology related to function, structure, and complications of lung disease. Review of risk factors. Discussion about methods for diagnosing sleep apnea and types of masks and machines for OSA. Includes a review of the use of types of environmental controls: home humidity, furnaces, filters, dust mite/pet prevention, HEPA vacuums. Discussion about weather changes, air quality and the benefits of nasal washing. Instruction on Warning signs, infection symptoms, calling MD promptly, preventive modes, and value of vaccinations. Review of effective airway clearance, coughing and/or vibration techniques. Emphasizing that all should Create an Action Plan. Written material given at graduation.   AED/CPR: - Group verbal and written instruction with the use of models to demonstrate the basic use of the AED with the  basic ABC's of resuscitation.    Anatomy and Cardiac Procedures: - Group verbal and visual presentation and models provide information about basic cardiac anatomy and function. Reviews the testing methods done to diagnose heart disease and the outcomes of the test results. Describes the treatment choices: Medical Management, Angioplasty, or Coronary Bypass Surgery for treating various heart conditions including Myocardial Infarction, Angina, Valve Disease, and Cardiac Arrhythmias.  Written material given at graduation.   Medication Safety: - Group verbal and visual instruction to review commonly prescribed medications for heart and lung disease. Reviews the medication, class of the drug, and  side effects. Includes the steps to properly store meds and maintain the prescription regimen.  Written material given at graduation.   Other: -Provides group and verbal instruction on various topics (see comments)   Knowledge Questionnaire Score:  Knowledge Questionnaire Score - 08/20/21 1637       Knowledge Questionnaire Score   Pre Score 18/18              Core Components/Risk Factors/Patient Goals at Admission:  Personal Goals and Risk Factors at Admission - 08/20/21 1633       Core Components/Risk Factors/Patient Goals on Admission    Weight Management Yes;Weight Loss    Intervention Weight Management: Provide education and appropriate resources to help participant work on and attain dietary goals.;Weight Management: Develop a combined nutrition and exercise program designed to reach desired caloric intake, while maintaining appropriate intake of nutrient and fiber, sodium and fats, and appropriate energy expenditure required for the weight goal.    Admit Weight 219 lb 6.4 oz (99.5 kg)    Goal Weight: Short Term 200 lb (90.7 kg)    Goal Weight: Long Term 145 lb (65.8 kg)    Expected Outcomes Long Term: Adherence to nutrition and physical activity/exercise program aimed toward attainment of established weight goal;Short Term: Continue to assess and modify interventions until short term weight is achieved;Weight Loss: Understanding of general recommendations for a balanced deficit meal plan, which promotes 1-2 lb weight loss per week and includes a negative energy balance of [phone] kcal/d;Understanding recommendations for meals to include 15-35% energy as protein, 25-35% energy from fat, 35-60% energy from carbohydrates, less than 200mg  of dietary cholesterol, 20-35 gm of total fiber daily;Understanding of distribution of calorie intake throughout the day with the consumption of 4-5 meals/snacks    Improve shortness of breath with ADL's Yes    Intervention Provide education,  individualized exercise plan and daily activity instruction to help decrease symptoms of SOB with activities of daily living.    Expected Outcomes Short Term: Improve cardiorespiratory fitness to achieve a reduction of symptoms when performing ADLs;Long Term: Be able to perform more ADLs without symptoms or delay the onset of symptoms    Increase knowledge of respiratory medications and ability to use respiratory devices properly  Yes    Intervention Provide education and demonstration as needed of appropriate use of medications, inhalers, and oxygen therapy.    Expected Outcomes Short Term: Achieves understanding of medications use. Understands that oxygen is a medication prescribed by physician. Demonstrates appropriate use of inhaler and oxygen therapy.    Diabetes Yes    Intervention Provide education about signs/symptoms and action to take for hypo/hyperglycemia.;Provide education about proper nutrition, including hydration, and aerobic/resistive exercise prescription along with prescribed medications to achieve blood glucose in normal ranges: Fasting glucose 65-99 mg/dL    Expected Outcomes Short Term: Participant verbalizes understanding of the signs/symptoms and immediate care of hyper/hypoglycemia, proper  foot care and importance of medication, aerobic/resistive exercise and nutrition plan for blood glucose control.;Long Term: Attainment of HbA1C < 7%.    Hypertension Yes    Intervention Provide education on lifestyle modifcations including regular physical activity/exercise, weight management, moderate sodium restriction and increased consumption of fresh fruit, vegetables, and low fat dairy, alcohol moderation, and smoking cessation.;Monitor prescription use compliance.    Expected Outcomes Short Term: Continued assessment and intervention until BP is < 140/52mm HG in hypertensive participants. < 130/44mm HG in hypertensive participants with diabetes, heart failure or chronic kidney  disease.;Long Term: Maintenance of blood pressure at goal levels.    Lipids Yes    Intervention Provide education and support for participant on nutrition & aerobic/resistive exercise along with prescribed medications to achieve LDL 70mg , HDL >40mg .    Expected Outcomes Short Term: Participant states understanding of desired cholesterol values and is compliant with medications prescribed. Participant is following exercise prescription and nutrition guidelines.;Long Term: Cholesterol controlled with medications as prescribed, with individualized exercise RX and with personalized nutrition plan. Value goals: LDL < 70mg , HDL > 40 mg.             Education:Diabetes - Individual verbal and written instruction to review signs/symptoms of diabetes, desired ranges of glucose level fasting, after meals and with exercise. Acknowledge that pre and post exercise glucose checks will be done for 3 sessions at entry of program. Flowsheet Row Pulmonary Rehab from 07/11/2021 in [facility] Cardiac and Pulmonary Rehab  Date 07/11/21  Educator [facility]  Instruction Review Code 1- Verbalizes Understanding       Know Your Numbers and Heart Failure: - Group verbal and visual instruction to discuss disease risk factors for cardiac and pulmonary disease and treatment options.  Reviews associated critical values for Overweight/Obesity, Hypertension, Cholesterol, and Diabetes.  Discusses basics of heart failure: signs/symptoms and treatments.  Introduces Heart Failure Zone chart for action plan for heart failure.  Written material given at graduation.   Core Components/Risk Factors/Patient Goals Review:    Core Components/Risk Factors/Patient Goals at Discharge (Final Review):    ITP Comments:  ITP Comments     Row Name 07/11/21 1138           ITP Comments Initial telephone orientation completed. Diagnosis can be found in [facility] 9/22. EP orientation scheduled for Wednesday 11/23 at 9:30am.                 Comments: initial ITP

## 2021-08-29 ENCOUNTER — Encounter: Payer: Self-pay | Admitting: *Deleted

## 2021-08-29 DIAGNOSIS — I5032 Chronic diastolic (congestive) heart failure: Secondary | ICD-10-CM

## 2021-08-29 NOTE — Progress Notes (Signed)
Pulmonary Individual Treatment Plan  Patient Details  Name: Amanda [name] MRN: [id number] Date of Birth: [birth year] Referring Provider:   Flowsheet Row Pulmonary Rehab from 08/20/2021 in [facility] Cardiac and Pulmonary Rehab  Referring Provider Kasa       Initial Encounter Date:  Flowsheet Row Pulmonary Rehab from 08/20/2021 in [facility] Cardiac and Pulmonary Rehab  Date 08/20/21       Visit Diagnosis: Chronic diastolic HF (heart failure) ([place])  Patient's Home Medications on Admission:  Current Outpatient Medications:    ACCU-CHEK GUIDE test strip, , Disp: , Rfl:    albuterol (VENTOLIN HFA) 108 (90 Base) MCG/ACT inhaler, Inhale 2 puffs into the lungs every 6 (six) hours as needed for wheezing or shortness of breath., Disp: 8 g, Rfl: 6   Ascorbic Acid (VITAMIN C CR) 1000 MG TBCR, Take 1 tablet by mouth daily., Disp: , Rfl:    B-D UF III MINI PEN NEEDLES 31G X 5 MM MISC, Inject into the skin daily., Disp: , Rfl:    BD PEN NEEDLE MICRO U/F 32G X 6 MM MISC, Inject into the skin., Disp: , Rfl:    buprenorphine (BUTRANS) 10 MCG/HR PTWK, , Disp: , Rfl:    cholecalciferol (VITAMIN D) 25 MCG (1000 UNIT) tablet, Take 1,000 Units by mouth daily., Disp: , Rfl:    desvenlafaxine (PRISTIQ) 50 MG 24 hr tablet, Take 50 mg by mouth daily., Disp: , Rfl:    Dexmethylphenidate HCl 25 MG CP24, Take 1 capsule by mouth every morning., Disp: , Rfl:    estradiol (ESTRACE) 0.1 MG/GM vaginal cream, , Disp: , Rfl:    ferrous sulfate 324 MG TBEC, Take 324 mg by mouth daily. (Patient not taking: Reported on 06/14/2021), Disp: , Rfl:    flunisolide (NASALIDE) 25 MCG/ACT (0.025%) SOLN, Place into the nose. (Patient not taking: Reported on 06/14/2021), Disp: , Rfl:    furosemide (LASIX) 40 MG tablet, Take 1 tablet (40 mg total) by mouth 2 (two) times daily., Disp: 180 tablet, Rfl: 3   HUMALOG KWIKPEN 100 UNIT/ML KwikPen, Inject into the skin., Disp: , Rfl:    hydrocortisone 2.5 % ointment, Apply topically 2 (two) times  daily., Disp: , Rfl:    hydrOXYzine (ATARAX/VISTARIL) 25 MG tablet, , Disp: , Rfl:    INPEN 100-GRAY-LILLY DEVI, as directed., Disp: , Rfl:    Insulin Pen Needle 31G X 6 MM MISC, SMARTSIG:1 Needle SUB-Q 4 Times Daily, Disp: , Rfl:    ipratropium-albuterol (DUONEB) 0.5-2.5 (3) MG/3ML SOLN, Inhale into the lungs., Disp: , Rfl:    JARDIANCE 25 MG TABS tablet, Take 25 mg by mouth daily., Disp: , Rfl:    LATUDA 40 MG TABS tablet, Take 40 mg by mouth at bedtime. (Patient not taking: Reported on 06/14/2021), Disp: , Rfl:    lidocaine (LIDODERM) 5 %, lidocaine 5 % topical patch  PLEASE SEE ATTACHED FOR DETAILED DIRECTIONS (Patient not taking: Reported on 06/14/2021), Disp: , Rfl:    LINZESS 290 MCG CAPS capsule, Take 290 mcg by mouth daily., Disp: , Rfl:    LORazepam (ATIVAN) 1 MG tablet, Take by mouth., Disp: , Rfl:    losartan (COZAAR) 25 MG tablet, Take 25 mg by mouth daily., Disp: , Rfl:    mirtazapine (REMERON SOL-TAB) 30 MG disintegrating tablet, Take 30 mg by mouth at bedtime as needed (Nap). , Disp: , Rfl: 2   montelukast (SINGULAIR) 10 MG tablet, Take 10 mg by mouth daily., Disp: , Rfl:    nabumetone (RELAFEN) 500 MG tablet,  Take by mouth., Disp: , Rfl:    NOVOLOG FLEXPEN 100 UNIT/ML FlexPen, Inject 10 Units into the skin daily. (Patient not taking: Reported on 06/14/2021), Disp: , Rfl:    omeprazole (PRILOSEC) 40 MG capsule, Take 40 mg by mouth daily., Disp: , Rfl:    potassium chloride SA (KLOR-CON) 20 MEQ tablet, Take 1 tablet (20 mEq total) by mouth daily., Disp: 90 tablet, Rfl: 3   pravastatin (PRAVACHOL) 40 MG tablet, Take 40 mg by mouth daily., Disp: , Rfl:    pregabalin (LYRICA) 150 MG capsule, Take 150 mg by mouth 3 (three) times daily., Disp: , Rfl:    PROAIR RESPICLICK 108 (90 Base) MCG/ACT AEPB, Inhale 2 puffs into the lungs every 4 (four) hours as needed., Disp: , Rfl:    propranolol ER (INDERAL LA) 80 MG 24 hr capsule, Take 1 capsule (80 mg total) by mouth daily., Disp: 90 capsule,  Rfl: 3   Semaglutide, 1 MG/DOSE, (OZEMPIC, 1 MG/DOSE,) 2 MG/1.5ML SOPN, Inject into the skin., Disp: , Rfl:    SUMAtriptan (IMITREX) 50 MG tablet, Take 1 tablet (50 mg total) by mouth every 2 (two) hours as needed for migraine. May repeat in 2 hours if headache persists or recurs.  Not to exceed 4 pills in a 24-hour period.  If you find you need more medication and you have taken 4 pills, please go directly to the emergency room or call 911. (Patient not taking: Reported on 06/14/2021), Disp: 10 tablet, Rfl: 0   tiotropium (SPIRIVA HANDIHALER) 18 MCG inhalation capsule, Place 1 capsule (18 mcg total) into inhaler and inhale daily., Disp: 30 capsule, Rfl: 6   tiZANidine (ZANAFLEX) 4 MG tablet, Take 4 mg by mouth every 8 (eight) hours as needed for muscle spasms. , Disp: , Rfl:    topiramate (TOPAMAX) 100 MG tablet, Take 100 mg by mouth 2 (two) times daily., Disp: , Rfl:    TRESIBA FLEXTOUCH 200 UNIT/ML FlexTouch Pen, 72 units per day seems very high for you.  Please follow up with your outpatient provider for further adjustment., Disp: , Rfl:    triamcinolone ointment (KENALOG) 0.1 %, Apply topically., Disp: , Rfl:    TRUEPLUS PEN NEEDLES 31G X 6 MM MISC, , Disp: , Rfl:    VRAYLAR 6 MG CAPS, Take 1 capsule by mouth daily., Disp: , Rfl:    zolpidem (AMBIEN) 10 MG tablet, Take 10 mg by mouth at bedtime., Disp: , Rfl:   Past Medical History: Past Medical History:  Diagnosis Date   ADD (attention deficit disorder)    Anxiety    Arthritis    knees, shoulder, upper back   Asthma    CFS (chronic fatigue syndrome)    Chewing difficulty    CHF (congestive heart failure) (HCC)    Chronic kidney disease    Constipation    Depression    Depression    Diabetes mellitus without complication (HCC)    Dyspnea    Environmental allergies    Fatty liver    Fibromyalgia    GERD (gastroesophageal reflux disease)    GI bleed 10/02/2018   Headache    migraines - 5x/mo   Hypertension    Hypokalemia  10/14/2017   Hypothyroidism    IBS (irritable bowel syndrome)    IDA (iron deficiency anemia) 02/23/2021   Joint pain    Lower extremity edema    Major depressive disorder, single episode    Migraine without aura and without status migrainosus, not intractable  Migraines    Motion sickness    ships   Osteoarthritis    Other specified disorders of thyroid    Sleep apnea    Stroke (HCC)    no residual deficits   Swallowing difficulty    Thyroid nodule    bilateral and goiter   Vitamin D deficiency    Wears contact lenses    Wears dentures    partial upper    Tobacco Use: Social History   Tobacco Use  Smoking Status Never  Smokeless Tobacco Never    Labs: Recent Review Flowsheet Data     Labs for ITP Cardiac and Pulmonary Rehab Latest Ref Rng & Units 08/05/2019 10/19/2019 01/11/2020 05/08/2020 09/13/2020   Cholestrol 0 - 200 mg/dL - 245(H) - 154 -   LDLCALC 0 - 99 mg/dL - 118(H) - 76 -   HDL >40 mg/dL - 87 - 57 -   Trlycerides <150 mg/dL - 237(H) - 105 -   Hemoglobin A1c 4.8 - 5.6 % 11.7(H) - 8.7(H) 8.4(H) 7.9(H)   HCO3 20.0 - 28.0 mmol/L - - - - -   ACIDBASEDEF 0.0 - 2.0 mmol/L - - - - -   O2SAT % - - - - -        Pulmonary Assessment Scores:  Pulmonary Assessment Scores     Row Name 08/20/21 1641         ADL UCSD   SOB Score total 90     Rest 3     Walk 4     Stairs 4     Bath 4     Dress 3     Shop 4       CAT Score   CAT Score 24       mMRC Score   mMRC Score 3              UCSD: Self-administered rating of dyspnea associated with activities of daily living (ADLs) 6-point scale (0 = "not at all" to 5 = "maximal or unable to do because of breathlessness")  Scoring Scores range from 0 to 120.  Minimally important difference is 5 units  CAT: CAT can identify the health impairment of COPD patients and is better correlated with disease progression.  CAT has a scoring range of zero to 40. The CAT score is classified into four groups of low  (less than 10), medium (10 - 20), high (21-30) and very high (31-40) based on the impact level of disease on health status. A CAT score over 10 suggests significant symptoms.  A worsening CAT score could be explained by an exacerbation, poor medication adherence, poor inhaler technique, or progression of COPD or comorbid conditions.  CAT MCID is 2 points  mMRC: mMRC (Modified Medical Research Council) Dyspnea Scale is used to assess the degree of baseline functional disability in patients of respiratory disease due to dyspnea. No minimal important difference is established. A decrease in score of 1 point or greater is considered a positive change.   Pulmonary Function Assessment:   Exercise Target Goals: Exercise Program Goal: Individual exercise prescription set using results from initial 6 min walk test and THRR while considering  patients activity barriers and safety.   Exercise Prescription Goal: Initial exercise prescription builds to 30-45 minutes a day of aerobic activity, 2-3 days per week.  Home exercise guidelines will be given to patient during program as part of exercise prescription that the participant will acknowledge.  Education: Aerobic Exercise: - Group verbal  and visual presentation on the components of exercise prescription. Introduces F.I.T.T principle from ACSM for exercise prescriptions.  Reviews F.I.T.T. principles of aerobic exercise including progression. Written material given at graduation.   Education: Resistance Exercise: - Group verbal and visual presentation on the components of exercise prescription. Introduces F.I.T.T principle from ACSM for exercise prescriptions  Reviews F.I.T.T. principles of resistance exercise including progression. Written material given at graduation.    Education: Exercise & Equipment Safety: - Individual verbal instruction and demonstration of equipment use and safety with use of the equipment. Flowsheet Row Pulmonary Rehab from  08/20/2021 in [facility] Cardiac and Pulmonary Rehab  Date 08/20/21  Educator [facility] [address] [address]  Instruction Review Code 1- Verbalizes Understanding       Education: Exercise Physiology & General Exercise Guidelines: - Group verbal and written instruction with models to review the exercise physiology of the cardiovascular system and associated critical values. Provides general exercise guidelines with specific guidelines to those with heart or lung disease.    Education: Flexibility, Balance, Mind/Body Relaxation: - Group verbal and visual presentation with interactive activity on the components of exercise prescription. Introduces F.I.T.T principle from ACSM for exercise prescriptions. Reviews F.I.T.T. principles of flexibility and balance exercise training including progression. Also discusses the mind body connection.  Reviews various relaxation techniques to help reduce and manage stress (i.e. Deep breathing, progressive muscle relaxation, and visualization). Balance handout provided to take home. Written material given at graduation.   Activity Barriers & Risk Stratification:  Activity Barriers & Cardiac Risk Stratification - 07/11/21 1142       Activity Barriers & Cardiac Risk Stratification   Activity Barriers Shortness of Breath;Fibromyalgia;Joint Problems;Deconditioning;Muscular Weakness             6 Minute Walk:  6 Minute Walk     Row Name 08/20/21 1614         6 Minute Walk   Phase Initial     Distance 1210 feet     Walk Time 6 minutes     # of Rest Breaks 0     MPH 2.29     METS 3.32     RPE 14     Perceived Dyspnea  3     VO2 Peak 11.63     Symptoms No     Resting HR 77 bpm     Resting BP 122/72     Resting Oxygen Saturation  97 %     Exercise Oxygen Saturation  during 6 min walk 91 %     Max Ex. HR 122 bpm     Max Ex. BP 142/82     2 Minute Post BP 130/82       Interval HR   1 Minute HR 108     2 Minute HR 113     3 Minute HR 113     4 Minute HR 113     5 Minute  HR 119     6 Minute HR 122     2 Minute Post HR 72     Interval Heart Rate? Yes       Interval Oxygen   Interval Oxygen? Yes     Baseline Oxygen Saturation % 97 %     1 Minute Oxygen Saturation % 91 %     1 Minute Liters of Oxygen 0 L     2 Minute Oxygen Saturation % 94 %     2 Minute Liters of Oxygen 0 L     3 Minute Oxygen  Saturation % 94 %     3 Minute Liters of Oxygen 0 L     4 Minute Oxygen Saturation % 94 %     4 Minute Liters of Oxygen 0 L     5 Minute Oxygen Saturation % 95 %     5 Minute Liters of Oxygen 0 L     6 Minute Oxygen Saturation % 95 %     6 Minute Liters of Oxygen 0 L     2 Minute Post Oxygen Saturation % 95 %     2 Minute Post Liters of Oxygen 122 L             Oxygen Initial Assessment:  Oxygen Initial Assessment - 07/11/21 1142       Home Oxygen   Home Oxygen Device None    Sleep Oxygen Prescription CPAP    Home Exercise Oxygen Prescription None    Home Resting Oxygen Prescription None      Intervention   Short Term Goals To learn and demonstrate proper use of respiratory medications;To learn and demonstrate proper pursed lip breathing techniques or other breathing techniques. ;To learn and understand importance of maintaining oxygen saturations>88%;To learn and understand importance of monitoring SPO2 with pulse oximeter and demonstrate accurate use of the pulse oximeter.    Long  Term Goals Verbalizes importance of monitoring SPO2 with pulse oximeter and return demonstration;Maintenance of O2 saturations>88%;Exhibits proper breathing techniques, such as pursed lip breathing or other method taught during program session;Compliance with respiratory medication;Demonstrates proper use of MDIs             Oxygen Re-Evaluation:   Oxygen Discharge (Final Oxygen Re-Evaluation):   Initial Exercise Prescription:  Initial Exercise Prescription - 08/20/21 1600       Date of Initial Exercise RX and Referring Provider   Date 08/20/21     Referring Provider Kasa      Oxygen   Maintain Oxygen Saturation 88% or higher      Treadmill   MPH 2    Grade 0    Minutes 15    METs 2.53      Recumbant Elliptical   Level 1    RPM 50    Minutes 15    METs 3.3      REL-XR   Level 1    Speed 50    Minutes 15    METs 3.3      Track   Laps 30    Minutes 15    METs 2.63      Prescription Details   Frequency (times per week) 3    Duration Progress to 30 minutes of continuous aerobic without signs/symptoms of physical distress      Intensity   THRR 40-80% of Max Heartrate 111-146    Ratings of Perceived Exertion 11-13    Perceived Dyspnea 0-4      Progression   Progression Continue to progress workloads to maintain intensity without signs/symptoms of physical distress.      Resistance Training   Training Prescription Yes    Weight 3    Reps 10-15             Perform Capillary Blood Glucose checks as needed.  Exercise Prescription Changes:   Exercise Prescription Changes     Row Name 08/20/21 1600             Response to Exercise   Blood Pressure (Admit) 122/72       Blood Pressure (  Exercise) 142/82       Blood Pressure (Exit) 130/82       Heart Rate (Admit) 77 bpm       Heart Rate (Exercise) 122 bpm       Heart Rate (Exit) 72 bpm       Oxygen Saturation (Admit) 97 %       Oxygen Saturation (Exercise) 91 %       Oxygen Saturation (Exit) 95 %       Rating of Perceived Exertion (Exercise) 14       Perceived Dyspnea (Exercise) 3       Symptoms none       Comments 6 min walk results                Exercise Comments:   Exercise Goals and Review:   Exercise Goals     Row Name 08/20/21 1631             Exercise Goals   Increase Physical Activity Yes       Intervention Develop an individualized exercise prescription for aerobic and resistive training based on initial evaluation findings, risk stratification, comorbidities and participant's personal goals.;Provide advice, education,  support and counseling about physical activity/exercise needs.       Expected Outcomes Short Term: Attend rehab on a regular basis to increase amount of physical activity.;Long Term: Add in home exercise to make exercise part of routine and to increase amount of physical activity.;Long Term: Exercising regularly at least 3-5 days a week.       Increase Strength and Stamina Yes       Intervention Provide advice, education, support and counseling about physical activity/exercise needs.;Develop an individualized exercise prescription for aerobic and resistive training based on initial evaluation findings, risk stratification, comorbidities and participant's personal goals.       Expected Outcomes Short Term: Increase workloads from initial exercise prescription for resistance, speed, and METs.;Short Term: Perform resistance training exercises routinely during rehab and add in resistance training at home;Long Term: Improve cardiorespiratory fitness, muscular endurance and strength as measured by increased METs and functional capacity ( )       Able to understand and use rate of perceived exertion (RPE) scale Yes       Intervention Provide education and explanation on how to use RPE scale       Expected Outcomes Short Term: Able to use RPE daily in rehab to express subjective intensity level;Long Term:  Able to use RPE to guide intensity level when exercising independently       Able to understand and use Dyspnea scale Yes       Intervention Provide education and explanation on how to use Dyspnea scale       Expected Outcomes Short Term: Able to use Dyspnea scale daily in rehab to express subjective sense of shortness of breath during exertion;Long Term: Able to use Dyspnea scale to guide intensity level when exercising independently       Knowledge and understanding of Target Heart Rate Range (THRR) Yes       Intervention Provide education and explanation of THRR including how the numbers were predicted  and where they are located for reference       Expected Outcomes Short Term: Able to state/look up THRR;Long Term: Able to use THRR to govern intensity when exercising independently;Short Term: Able to use daily as guideline for intensity in rehab       Able to check pulse independently Yes  Intervention Provide education and demonstration on how to check pulse in carotid and radial arteries.;Review the importance of being able to check your own pulse for safety during independent exercise       Expected Outcomes Short Term: Able to explain why pulse checking is important during independent exercise;Long Term: Able to check pulse independently and accurately       Understanding of Exercise Prescription Yes       Intervention Provide education, explanation, and written materials on patient's individual exercise prescription       Expected Outcomes Short Term: Able to explain program exercise prescription;Long Term: Able to explain home exercise prescription to exercise independently                Exercise Goals Re-Evaluation :   Discharge Exercise Prescription (Final Exercise Prescription Changes):  Exercise Prescription Changes - 08/20/21 1600       Response to Exercise   Blood Pressure (Admit) 122/72    Blood Pressure (Exercise) 142/82    Blood Pressure (Exit) 130/82    Heart Rate (Admit) 77 bpm    Heart Rate (Exercise) 122 bpm    Heart Rate (Exit) 72 bpm    Oxygen Saturation (Admit) 97 %    Oxygen Saturation (Exercise) 91 %    Oxygen Saturation (Exit) 95 %    Rating of Perceived Exertion (Exercise) 14    Perceived Dyspnea (Exercise) 3    Symptoms none    Comments 6 min walk results             Nutrition:  Target Goals: Understanding of nutrition guidelines, daily intake of sodium 1500mg , cholesterol 200mg , calories 30% from fat and 7% or less from saturated fats, daily to have 5 or more servings of fruits and vegetables.  Education: All About Nutrition: -Group  instruction provided by verbal, written material, interactive activities, discussions, models, and posters to present general guidelines for heart healthy nutrition including fat, fiber, MyPlate, the role of sodium in heart healthy nutrition, utilization of the nutrition label, and utilization of this knowledge for meal planning. Follow up email sent as well. Written material given at graduation.   Biometrics:  Pre Biometrics - 08/20/21 1632       Pre Biometrics   Height 5' 5.5" (1.664 m)    Weight 219 lb 6.4 oz (99.5 kg)    BMI (Calculated) 35.94    Single Leg Stand 21.22 seconds              Nutrition Therapy Plan and Nutrition Goals:  Nutrition Therapy & Goals - 08/20/21 1635       Intervention Plan   Intervention Prescribe, educate and counsel regarding individualized specific dietary modifications aiming towards targeted core components such as weight, hypertension, lipid management, diabetes, heart failure and other comorbidities.    Expected Outcomes Short Term Goal: Understand basic principles of dietary content, such as calories, fat, sodium, cholesterol and nutrients.;Short Term Goal: A plan has been developed with personal nutrition goals set during dietitian appointment.;Long Term Goal: Adherence to prescribed nutrition plan.             Nutrition Assessments:  MEDIFICTS Score Key: ?70 Need to make dietary changes  40-70 Heart Healthy Diet ? 40 Therapeutic Level Cholesterol Diet  Flowsheet Row Pulmonary Rehab from 08/20/2021 in [facility] Cardiac and Pulmonary Rehab  Picture Your Plate Total Score on Admission 49      Picture Your Plate Scores: [id number] Unhealthy dietary pattern with much room for improvement. 41-50 Dietary  pattern unlikely to meet recommendations for good health and room for improvement. 51-60 More healthful dietary pattern, with some room for improvement.  >60 Healthy dietary pattern, although there may be some specific behaviors that could be  improved.   Nutrition Goals Re-Evaluation:   Nutrition Goals Discharge (Final Nutrition Goals Re-Evaluation):   Psychosocial: Target Goals: Acknowledge presence or absence of significant depression and/or stress, maximize coping skills, provide positive support system. Participant is able to verbalize types and ability to use techniques and skills needed for reducing stress and depression.   Education: Stress, Anxiety, and Depression - Group verbal and visual presentation to define topics covered.  Reviews how body is impacted by stress, anxiety, and depression.  Also discusses healthy ways to reduce stress and to treat/manage anxiety and depression.  Written material given at graduation.   Education: Sleep Hygiene -Provides group verbal and written instruction about how sleep can affect your health.  Define sleep hygiene, discuss sleep cycles and impact of sleep habits. Review good sleep hygiene tips.    Initial Review & Psychosocial Screening:  Initial Psych Review & Screening - 07/11/21 1153       Initial Review   Current issues with Current Sleep Concerns;Current Stress Concerns;History of Depression    Source of Stress Concerns Chronic Illness      Family Dynamics   Good Support System? Yes      Screening Interventions   Interventions Encouraged to exercise;To provide support and resources with identified psychosocial needs;Provide feedback about the scores to participant    Expected Outcomes Long Term Goal: Stressors or current issues are controlled or eliminated.;Short Term goal: Utilizing psychosocial counselor, staff and physician to assist with identification of specific Stressors or current issues interfering with healing process. Setting desired goal for each stressor or current issue identified.;Short Term goal: Identification and review with participant of any Quality of Life or Depression concerns found by scoring the questionnaire.;Long Term goal: The participant  improves quality of Life and PHQ9 Scores as seen by post scores and/or verbalization of changes             Quality of Life Scores:  Scores of 19 and below usually indicate a poorer quality of life in these areas.  A difference of  2-3 points is a clinically meaningful difference.  A difference of 2-3 points in the total score of the Quality of Life Index has been associated with significant improvement in overall quality of life, self-image, physical symptoms, and general health in studies assessing change in quality of life.  PHQ-9: Recent Review Flowsheet Data     Depression screen [facility] 2/9 08/20/2021 11/16/2020 10/16/2020 07/07/2020 10/19/2019   Decreased Interest 1 0 0 1 3   Down, Depressed, Hopeless 1 0 0 0 3   PHQ - 2 Score 2 0 0 1 6   Altered sleeping 3 - - - 3   Tired, decreased energy 1 - - - 3   Change in appetite 2 - - - 3   Feeling bad or failure about yourself  3 - - - 2   Trouble concentrating 3 - - - 2   Moving slowly or fidgety/restless 0 - - - 3   Suicidal thoughts 0 - - - 2   PHQ-9 Score 14 - - - 24   Difficult doing work/chores Extremely dIfficult - - - Very difficult      Interpretation of Total Score  Total Score Depression Severity:  1-4 = Minimal depression, 5-9 = Mild  depression, 10-14 = Moderate depression, 15-19 = Moderately severe depression, 20-27 = Severe depression   Psychosocial Evaluation and Intervention:  Psychosocial Evaluation - 07/11/21 1156       Psychosocial Evaluation & Interventions   Interventions Encouraged to exercise with the program and follow exercise prescription;Relaxation education    Comments Ms. Gilford Rile has multiple health issues including sleep apnea, asthma, diabetes, and heart failure. She states she feels like her symptoms are being addressed. She does continue to have insomnia issues which medication helps some, but most nights are restless. She has a history of PTSD as well. She has had therapy in the past and knows when to  reach out to address worsening issues/symptoms. She is coming to the program with her daughter who also has diastolic heart failure in hopes to get back on track. They both came to the program in 2018 but were unable to complete due to work scheduling.    Expected Outcomes Short: attend pulmonary rehab for education and exercise. Long: develop and maintain positive self care habits.    Continue Psychosocial Services  Follow up required by staff             Psychosocial Re-Evaluation:   Psychosocial Discharge (Final Psychosocial Re-Evaluation):   Education: Education Goals: Education classes will be provided on a weekly basis, covering required topics. Participant will state understanding/return demonstration of topics presented.  Learning Barriers/Preferences:  Learning Barriers/Preferences - 07/11/21 1153       Learning Barriers/Preferences   Learning Barriers None    Learning Preferences None             General Pulmonary Education Topics:  Infection Prevention: - Provides verbal and written material to individual with discussion of infection control including proper hand washing and proper equipment cleaning during exercise session. Flowsheet Row Pulmonary Rehab from 08/20/2021 in [facility] Cardiac and Pulmonary Rehab  Date 08/20/21  Educator [facility]  Instruction Review Code 1- Verbalizes Understanding       Falls Prevention: - Provides verbal and written material to individual with discussion of falls prevention and safety. Flowsheet Row Pulmonary Rehab from 08/20/2021 in [facility] Cardiac and Pulmonary Rehab  Date 08/20/21  Educator [facility]  Instruction Review Code 1- Verbalizes Understanding       Chronic Lung Disease Review: - Group verbal instruction with posters, models, PowerPoint presentations and videos,  to review new updates, new respiratory medications, new advancements in procedures and treatments. Providing information on websites and "800" numbers for continued  self-education. Includes information about supplement oxygen, available portable oxygen systems, continuous and intermittent flow rates, oxygen safety, concentrators, and Medicare reimbursement for oxygen. Explanation of Pulmonary Drugs, including class, frequency, complications, importance of spacers, rinsing mouth after steroid MDI's, and proper cleaning methods for nebulizers. Review of basic lung anatomy and physiology related to function, structure, and complications of lung disease. Review of risk factors. Discussion about methods for diagnosing sleep apnea and types of masks and machines for OSA. Includes a review of the use of types of environmental controls: home humidity, furnaces, filters, dust mite/pet prevention, HEPA vacuums. Discussion about weather changes, air quality and the benefits of nasal washing. Instruction on Warning signs, infection symptoms, calling MD promptly, preventive modes, and value of vaccinations. Review of effective airway clearance, coughing and/or vibration techniques. Emphasizing that all should Create an Action Plan. Written material given at graduation.   AED/CPR: - Group verbal and written instruction with the use of models to demonstrate the basic use of the AED with the  basic ABC's of resuscitation.    Anatomy and Cardiac Procedures: - Group verbal and visual presentation and models provide information about basic cardiac anatomy and function. Reviews the testing methods done to diagnose heart disease and the outcomes of the test results. Describes the treatment choices: Medical Management, Angioplasty, or Coronary Bypass Surgery for treating various heart conditions including Myocardial Infarction, Angina, Valve Disease, and Cardiac Arrhythmias.  Written material given at graduation.   Medication Safety: - Group verbal and visual instruction to review commonly prescribed medications for heart and lung disease. Reviews the medication, class of the drug, and  side effects. Includes the steps to properly store meds and maintain the prescription regimen.  Written material given at graduation.   Other: -Provides group and verbal instruction on various topics (see comments)   Knowledge Questionnaire Score:  Knowledge Questionnaire Score - 08/20/21 1637       Knowledge Questionnaire Score   Pre Score 18/18              Core Components/Risk Factors/Patient Goals at Admission:  Personal Goals and Risk Factors at Admission - 08/20/21 1633       Core Components/Risk Factors/Patient Goals on Admission    Weight Management Yes;Weight Loss    Intervention Weight Management: Provide education and appropriate resources to help participant work on and attain dietary goals.;Weight Management: Develop a combined nutrition and exercise program designed to reach desired caloric intake, while maintaining appropriate intake of nutrient and fiber, sodium and fats, and appropriate energy expenditure required for the weight goal.    Admit Weight 219 lb 6.4 oz (99.5 kg)    Goal Weight: Short Term 200 lb (90.7 kg)    Goal Weight: Long Term 145 lb (65.8 kg)    Expected Outcomes Long Term: Adherence to nutrition and physical activity/exercise program aimed toward attainment of established weight goal;Short Term: Continue to assess and modify interventions until short term weight is achieved;Weight Loss: Understanding of general recommendations for a balanced deficit meal plan, which promotes 1-2 lb weight loss per week and includes a negative energy balance of [phone] kcal/d;Understanding recommendations for meals to include 15-35% energy as protein, 25-35% energy from fat, 35-60% energy from carbohydrates, less than 200mg  of dietary cholesterol, 20-35 gm of total fiber daily;Understanding of distribution of calorie intake throughout the day with the consumption of 4-5 meals/snacks    Improve shortness of breath with ADL's Yes    Intervention Provide education,  individualized exercise plan and daily activity instruction to help decrease symptoms of SOB with activities of daily living.    Expected Outcomes Short Term: Improve cardiorespiratory fitness to achieve a reduction of symptoms when performing ADLs;Long Term: Be able to perform more ADLs without symptoms or delay the onset of symptoms    Increase knowledge of respiratory medications and ability to use respiratory devices properly  Yes    Intervention Provide education and demonstration as needed of appropriate use of medications, inhalers, and oxygen therapy.    Expected Outcomes Short Term: Achieves understanding of medications use. Understands that oxygen is a medication prescribed by physician. Demonstrates appropriate use of inhaler and oxygen therapy.    Diabetes Yes    Intervention Provide education about signs/symptoms and action to take for hypo/hyperglycemia.;Provide education about proper nutrition, including hydration, and aerobic/resistive exercise prescription along with prescribed medications to achieve blood glucose in normal ranges: Fasting glucose 65-99 mg/dL    Expected Outcomes Short Term: Participant verbalizes understanding of the signs/symptoms and immediate care of hyper/hypoglycemia, proper  foot care and importance of medication, aerobic/resistive exercise and nutrition plan for blood glucose control.;Long Term: Attainment of HbA1C < 7%.    Hypertension Yes    Intervention Provide education on lifestyle modifcations including regular physical activity/exercise, weight management, moderate sodium restriction and increased consumption of fresh fruit, vegetables, and low fat dairy, alcohol moderation, and smoking cessation.;Monitor prescription use compliance.    Expected Outcomes Short Term: Continued assessment and intervention until BP is < 140/57mm HG in hypertensive participants. < 130/36mm HG in hypertensive participants with diabetes, heart failure or chronic kidney  disease.;Long Term: Maintenance of blood pressure at goal levels.    Lipids Yes    Intervention Provide education and support for participant on nutrition & aerobic/resistive exercise along with prescribed medications to achieve LDL 70mg , HDL >40mg .    Expected Outcomes Short Term: Participant states understanding of desired cholesterol values and is compliant with medications prescribed. Participant is following exercise prescription and nutrition guidelines.;Long Term: Cholesterol controlled with medications as prescribed, with individualized exercise RX and with personalized nutrition plan. Value goals: LDL < 70mg , HDL > 40 mg.             Education:Diabetes - Individual verbal and written instruction to review signs/symptoms of diabetes, desired ranges of glucose level fasting, after meals and with exercise. Acknowledge that pre and post exercise glucose checks will be done for 3 sessions at entry of program. Flowsheet Row Pulmonary Rehab from 07/11/2021 in [facility] Cardiac and Pulmonary Rehab  Date 07/11/21  Educator [facility]  Instruction Review Code 1- Verbalizes Understanding       Know Your Numbers and Heart Failure: - Group verbal and visual instruction to discuss disease risk factors for cardiac and pulmonary disease and treatment options.  Reviews associated critical values for Overweight/Obesity, Hypertension, Cholesterol, and Diabetes.  Discusses basics of heart failure: signs/symptoms and treatments.  Introduces Heart Failure Zone chart for action plan for heart failure.  Written material given at graduation.   Core Components/Risk Factors/Patient Goals Review:    Core Components/Risk Factors/Patient Goals at Discharge (Final Review):    ITP Comments:  ITP Comments     Row Name 07/11/21 1138 08/29/21 0828         ITP Comments Initial telephone orientation completed. Diagnosis can be found in [facility] 9/22. EP orientation scheduled for Wednesday 11/23 at 9:30am. 30 Day review  completed. Medical Director ITP review done, changes made as directed, and signed approval by Medical Director.               Comments:

## 2021-09-03 ENCOUNTER — Encounter: Payer: Self-pay | Admitting: Oncology

## 2021-09-05 ENCOUNTER — Encounter: Payer: Commercial Managed Care - HMO | Attending: Internal Medicine

## 2021-09-05 DIAGNOSIS — I5032 Chronic diastolic (congestive) heart failure: Secondary | ICD-10-CM | POA: Insufficient documentation

## 2021-09-06 ENCOUNTER — Telehealth: Payer: Self-pay

## 2021-09-06 NOTE — Telephone Encounter (Signed)
LMOM

## 2021-09-13 ENCOUNTER — Other Ambulatory Visit: Payer: Self-pay | Admitting: *Deleted

## 2021-09-13 DIAGNOSIS — D509 Iron deficiency anemia, unspecified: Secondary | ICD-10-CM

## 2021-09-14 ENCOUNTER — Encounter: Payer: Self-pay | Admitting: Oncology

## 2021-09-17 NOTE — Progress Notes (Deleted)
[place]  [phone] [phone]  ID: Amanda [name] OB: 01/16/1966  MR#: [id number]  [id number]  Patient Care Team: Donnie Coffin, MD as PCP - General (Family Medicine) Alisa Graff, FNP as Nurse Practitioner (Family Medicine) Dionisio David, MD as Consulting Physician (Cardiology) Cathi Roan, [facility] (Inactive) as Pharmacist (Pharmacist) Pleasant, Eppie Gibson, RN as [facility]  CHIEF COMPLAINT: Iron deficiency anemia  INTERVAL HISTORY: ***  REVIEW OF SYSTEMS:   ROS  As per HPI. Otherwise, a complete review of systems is negative.  PAST MEDICAL HISTORY: Past Medical History:  Diagnosis Date   ADD (attention deficit disorder)    Anxiety    Arthritis    knees, shoulder, upper back   Asthma    CFS (chronic fatigue syndrome)    Chewing difficulty    CHF (congestive heart failure) (HCC)    Chronic kidney disease    Constipation    Depression    Depression    Diabetes mellitus without complication (HCC)    Dyspnea    Environmental allergies    Fatty liver    Fibromyalgia    GERD (gastroesophageal reflux disease)    GI bleed 10/02/2018   Headache    migraines - 5x/mo   Hypertension    Hypokalemia 10/14/2017   Hypothyroidism    IBS (irritable bowel syndrome)    IDA (iron deficiency anemia) 02/23/2021   Joint pain    Lower extremity edema    Major depressive disorder, single episode    Migraine without aura and without status migrainosus, not intractable    Migraines    Motion sickness    ships   Osteoarthritis    Other specified disorders of thyroid    Sleep apnea    Stroke ([place])    no residual deficits   Swallowing difficulty    Thyroid nodule    bilateral and goiter   Vitamin D deficiency    Wears contact lenses    Wears dentures    partial upper    PAST SURGICAL HISTORY: Past Surgical History:  Procedure Laterality Date   ABDOMINAL HYSTERECTOMY  2000's   ABDOMINAL  HYSTERECTOMY     ABDOMINAL SURGERY     laparoscopy x4 with lysis of adhesions   [place] N/A 04/03/2017   Procedure: LAPAROSCOPIC CHOLECYSTECTOMY;  Surgeon: Olean Ree, MD;  Location: [facility] ORS;  Service: General;  Laterality: N/A;   COLONOSCOPY N/A 10/06/2018   Procedure: COLONOSCOPY;  Surgeon: Lin Landsman, MD;  Location: [facility] ENDOSCOPY;  Service: Gastroenterology;  Laterality: N/A;   COLONOSCOPY WITH PROPOFOL N/A 03/10/2017   Procedure: COLONOSCOPY WITH PROPOFOL;  Surgeon: Manya Silvas, MD;  [facility] ENDOSCOPY;  Service: Endoscopy;  Laterality: N/A;   ECTOPIC PREGNANCY SURGERY  2000's   ESOPHAGOGASTRODUODENOSCOPY (EGD) WITH PROPOFOL N/A 03/10/2017   Procedure: ESOPHAGOGASTRODUODENOSCOPY (EGD) WITH PROPOFOL;  Surgeon: Manya Silvas, MD;  Location: [facility] ENDOSCOPY;  Service: Endoscopy;  Laterality: N/A;   HERNIA REPAIR     JOINT REPLACEMENT Right 12/16/12   knee- medial - makoplasty   KNEE ARTHROSCOPY Right 2006   partial medial and lateral meniscectomies   KNEE ARTHROSCOPY Right 10/06/2015   Procedure: RIGHT KNEE ARTHROSCOPY WITH DEBRIDEMENT;  Surgeon: Leanor Kail, MD;  Location: [place];  Service: Orthopedics;  Laterality: Right;  Diabetic - insulin and oral meds   ROTATOR CUFF REPAIR Left 2006   TUBAL LIGATION  2000's  FAMILY HISTORY: Family History  Problem Relation Age of Onset   Hypertension Mother    Diabetes Mother    Heart disease Mother    Depression Mother    Obesity Mother    Hypertension Father    Diabetes Father    Allergies Father    Kidney disease Father    Cancer Father    Breast cancer Neg Hx     ADVANCED DIRECTIVES (Y/N):  N  HEALTH MAINTENANCE: Social History   Tobacco Use   Smoking status: Never   Smokeless tobacco: Never  Vaping Use   Vaping Use: Never used  Substance Use Topics   Alcohol use: Not Currently   Drug use: Never     Colonoscopy:  PAP:  Bone  density:  Lipid panel:  Allergies  Allergen Reactions   Penicillins Anaphylaxis    Has patient had a PCN reaction causing immediate rash, facial/tongue/throat swelling, SOB or lightheadedness with hypotension: Yes Has patient had a PCN reaction causing severe rash involving mucus membranes or skin necrosis: No Has patient had a PCN reaction that required hospitalization No Has patient had a PCN reaction occurring within the last 10 years: No If all of the above answers are "NO", then may proceed with Cephalosporin use.  Has patient had a PCN reaction causing immediate rash, facial/tongue/throat swelling, SOB or lightheadedness with hypotension: Yes Has patient had a PCN reaction causing severe rash involving mucus membranes or skin necrosis: No Has patient had a PCN reaction that required hospitalization No Has patient had a PCN reaction occurring within the last 10 years: No If all of the above answers are "NO", then may proceed with Cephalosporin use. Other reaction(s): Other (See Comments) Severe/almost died Other reaction(s): ANAPHYLAXIS Has patient had a PCN reaction causing immediate rash, facial/tongue/throat swelling, SOB or lightheadedness with hypotension: Yes Has patient had a PCN reaction causing severe rash involving mucus membranes or skin necrosis: No Has patient had a PCN reaction that required hospitalization No Has patient had a PCN reaction occurring within the last 10 years: No If all of the above answers are "NO", then may proceed with Cephalosporin use. Other reaction(s): Other (See Comments) Severe/almost died Other reaction(s): ANAPHYLAXIS Has patient had a PCN reaction causing immediate rash, facial/tongue/throat swelling, SOB or lightheadedness with hypotension: Yes Has patient had a PCN reaction causing severe rash involving mucus membranes or skin necrosis: No Has patient had a PCN reaction that required hospitalization No Has patient had a PCN reaction  occurring within the last 10 years: No If all of the above answers are "NO", then may proceed with Cephalospo... (TRUNCATED)   Penicillins Anaphylaxis   Ace Inhibitors Swelling   Ace Inhibitors Swelling    Current Outpatient Medications  Medication Sig Dispense Refill   ACCU-CHEK GUIDE test strip      albuterol (VENTOLIN HFA) 108 (90 Base) MCG/ACT inhaler Inhale 2 puffs into the lungs every 6 (six) hours as needed for wheezing or shortness of breath. 8 g 6   Ascorbic Acid (VITAMIN C CR) 1000 MG TBCR Take 1 tablet by mouth daily.     B-D UF III MINI PEN NEEDLES 31G X 5 MM MISC Inject into the skin daily.     BD PEN NEEDLE MICRO U/F 32G X 6 MM MISC Inject into the skin.     buprenorphine (BUTRANS) 10 MCG/HR PTWK      cholecalciferol (VITAMIN D) 25 MCG (1000 UNIT) tablet Take 1,000 Units by mouth daily.     desvenlafaxine (  PRISTIQ) 50 MG 24 hr tablet Take 50 mg by mouth daily.     Dexmethylphenidate HCl 25 MG CP24 Take 1 capsule by mouth every morning.     estradiol (ESTRACE) 0.1 MG/GM vaginal cream      ferrous sulfate 324 MG TBEC Take 324 mg by mouth daily. (Patient not taking: Reported on 06/14/2021)     flunisolide (NASALIDE) 25 MCG/ACT (0.025%) SOLN Place into the nose. (Patient not taking: Reported on 06/14/2021)     furosemide (LASIX) 40 MG tablet Take 1 tablet (40 mg total) by mouth 2 (two) times daily. 180 tablet 3   HUMALOG KWIKPEN 100 UNIT/ML KwikPen Inject into the skin.     hydrocortisone 2.5 % ointment Apply topically 2 (two) times daily.     hydrOXYzine (ATARAX/VISTARIL) 25 MG tablet      INPEN 100-GRAY-LILLY DEVI as directed.     Insulin Pen Needle 31G X 6 MM MISC SMARTSIG:1 Needle SUB-Q 4 Times Daily     ipratropium-albuterol (DUONEB) 0.5-2.5 (3) MG/3ML SOLN Inhale into the lungs.     JARDIANCE 25 MG TABS tablet Take 25 mg by mouth daily.     LATUDA 40 MG TABS tablet Take 40 mg by mouth at bedtime. (Patient not taking: Reported on 06/14/2021)     lidocaine (LIDODERM) 5 %  lidocaine 5 % topical patch  PLEASE SEE ATTACHED FOR DETAILED DIRECTIONS (Patient not taking: Reported on 06/14/2021)     LINZESS 290 MCG CAPS capsule Take 290 mcg by mouth daily.     LORazepam (ATIVAN) 1 MG tablet Take by mouth.     losartan (COZAAR) 25 MG tablet Take 25 mg by mouth daily.     mirtazapine (REMERON SOL-TAB) 30 MG disintegrating tablet Take 30 mg by mouth at bedtime as needed (Nap).   2   montelukast (SINGULAIR) 10 MG tablet Take 10 mg by mouth daily.     nabumetone (RELAFEN) 500 MG tablet Take by mouth.     NOVOLOG FLEXPEN 100 UNIT/ML FlexPen Inject 10 Units into the skin daily. (Patient not taking: Reported on 06/14/2021)     omeprazole (PRILOSEC) 40 MG capsule Take 40 mg by mouth daily.     potassium chloride SA (KLOR-CON) 20 MEQ tablet Take 1 tablet (20 mEq total) by mouth daily. 90 tablet 3   pravastatin (PRAVACHOL) 40 MG tablet Take 40 mg by mouth daily.     pregabalin (LYRICA) 150 MG capsule Take 150 mg by mouth 3 (three) times daily.     PROAIR RESPICLICK 123XX123 (90 Base) MCG/ACT AEPB Inhale 2 puffs into the lungs every 4 (four) hours as needed.     propranolol ER (INDERAL LA) 80 MG 24 hr capsule Take 1 capsule (80 mg total) by mouth daily. 90 capsule 3   Semaglutide, 1 MG/DOSE, (OZEMPIC, 1 MG/DOSE,) 2 MG/1.5ML SOPN Inject into the skin.     SUMAtriptan (IMITREX) 50 MG tablet Take 1 tablet (50 mg total) by mouth every 2 (two) hours as needed for migraine. May repeat in 2 hours if headache persists or recurs.  Not to exceed 4 pills in a 24-hour period.  If you find you need more medication and you have taken 4 pills, please go directly to the emergency room or call 911. (Patient not taking: Reported on 06/14/2021) 10 tablet 0   tiotropium (SPIRIVA HANDIHALER) 18 MCG inhalation capsule Place 1 capsule (18 mcg total) into inhaler and inhale daily. 30 capsule 6   tiZANidine (ZANAFLEX) 4 MG tablet Take 4 mg  by mouth every 8 (eight) hours as needed for muscle spasms.      topiramate  (TOPAMAX) 100 MG tablet Take 100 mg by mouth 2 (two) times daily.     TRESIBA FLEXTOUCH 200 UNIT/ML FlexTouch Pen 72 units per day seems very high for you.  Please follow up with your outpatient provider for further adjustment.     triamcinolone ointment (KENALOG) 0.1 % Apply topically.     TRUEPLUS PEN NEEDLES 31G X 6 MM MISC      VRAYLAR 6 MG CAPS Take 1 capsule by mouth daily.     zolpidem (AMBIEN) 10 MG tablet Take 10 mg by mouth at bedtime.     No current facility-administered medications for this visit.    OBJECTIVE: There were no vitals filed for this visit.   There is no height or weight on file to calculate BMI.    ECOG FS:{CHL ONC [id number]  General: Well-developed, well-nourished, no acute distress. Eyes: Pink conjunctiva, anicteric sclera. HEENT: Normocephalic, moist mucous membranes. Lungs: No audible wheezing or coughing. Heart: Regular rate and rhythm. Abdomen: Soft, nontender, no obvious distention. Musculoskeletal: No edema, cyanosis, or clubbing. Neuro: Alert, answering all questions appropriately. Cranial nerves grossly intact. Skin: No rashes or petechiae noted. Psych: Normal affect. Lymphatics: No cervical, calvicular, axillary or inguinal LAD.   LAB RESULTS:  Lab Results  Component Value Date   NA 139 05/24/2021   K 4.0 05/24/2021   CL 106 05/24/2021   CO2 28 05/24/2021   GLUCOSE 91 05/24/2021   BUN 16 05/24/2021   CREATININE 0.91 05/24/2021   CALCIUM 9.5 05/24/2021   PROT 7.8 02/22/2021   ALBUMIN 4.1 02/22/2021   AST 22 02/22/2021   ALT 30 02/22/2021   ALKPHOS 126 02/22/2021   BILITOT 0.4 02/22/2021   GFRNONAA >60 05/24/2021   GFRAA >60 05/09/2020    Lab Results  Component Value Date   WBC 6.0 06/14/2021   NEUTROABS 3.7 06/14/2021   HGB 14.2 06/14/2021   HCT 43.5 06/14/2021   MCV 89.7 06/14/2021   PLT 408 (H) 06/14/2021     STUDIES: No results found.  ASSESSMENT: Iron deficiency anemia.  PLAN:    Iron deficiency  anemia:  Patient expressed understanding and was in agreement with this plan. She also understands that She can call clinic at any time with any questions, concerns, or complaints.    Cancer Staging  No matching staging information was found for the patient.  Lloyd Huger, MD   09/17/2021 3:41 PM

## 2021-09-18 ENCOUNTER — Inpatient Hospital Stay: Payer: Medicare Other

## 2021-09-18 ENCOUNTER — Encounter: Payer: Self-pay | Admitting: Oncology

## 2021-09-18 ENCOUNTER — Inpatient Hospital Stay: Payer: Medicare Other | Admitting: Oncology

## 2021-09-18 DIAGNOSIS — D509 Iron deficiency anemia, unspecified: Secondary | ICD-10-CM

## 2021-09-19 ENCOUNTER — Other Ambulatory Visit: Payer: Self-pay | Admitting: *Deleted

## 2021-09-19 NOTE — Patient Outreach (Signed)
[facility]) Care Management  09/19/2021  Amanda [name] 1966/01/03 [id number]   RN Health Coach attempted follow up outreach call to patient.  Patient was unavailable. Voicemail was full.  Plan: RN will call patient again within 30 days.  Gean Maidens BSN RN [facility] Care Management [phone].

## 2021-09-25 ENCOUNTER — Encounter: Payer: Self-pay | Admitting: *Deleted

## 2021-09-25 DIAGNOSIS — I5032 Chronic diastolic (congestive) heart failure: Secondary | ICD-10-CM

## 2021-09-25 NOTE — Progress Notes (Signed)
Pulmonary Individual Treatment Plan  Patient Details  Name: Amanda [name] MRN: [id number] Date of Birth: [birth year] Referring Provider:   Flowsheet Row Pulmonary Rehab from 08/20/2021 in [facility] Cardiac and Pulmonary Rehab  Referring Provider Kasa       Initial Encounter Date:  Flowsheet Row Pulmonary Rehab from 08/20/2021 in [facility] Cardiac and Pulmonary Rehab  Date 08/20/21       Visit Diagnosis: Chronic diastolic HF (heart failure) ([place])  Patient's Home Medications on Admission:  Current Outpatient Medications:    ACCU-CHEK GUIDE test strip, , Disp: , Rfl:    albuterol (VENTOLIN HFA) 108 (90 Base) MCG/ACT inhaler, Inhale 2 puffs into the lungs every 6 (six) hours as needed for wheezing or shortness of breath., Disp: 8 g, Rfl: 6   Ascorbic Acid (VITAMIN C CR) 1000 MG TBCR, Take 1 tablet by mouth daily., Disp: , Rfl:    B-D UF III MINI PEN NEEDLES 31G X 5 MM MISC, Inject into the skin daily., Disp: , Rfl:    BD PEN NEEDLE MICRO U/F 32G X 6 MM MISC, Inject into the skin., Disp: , Rfl:    buprenorphine (BUTRANS) 10 MCG/HR PTWK, , Disp: , Rfl:    cholecalciferol (VITAMIN D) 25 MCG (1000 UNIT) tablet, Take 1,000 Units by mouth daily., Disp: , Rfl:    desvenlafaxine (PRISTIQ) 50 MG 24 hr tablet, Take 50 mg by mouth daily., Disp: , Rfl:    Dexmethylphenidate HCl 25 MG CP24, Take 1 capsule by mouth every morning., Disp: , Rfl:    estradiol (ESTRACE) 0.1 MG/GM vaginal cream, , Disp: , Rfl:    ferrous sulfate 324 MG TBEC, Take 324 mg by mouth daily. (Patient not taking: Reported on 06/14/2021), Disp: , Rfl:    flunisolide (NASALIDE) 25 MCG/ACT (0.025%) SOLN, Place into the nose. (Patient not taking: Reported on 06/14/2021), Disp: , Rfl:    furosemide (LASIX) 40 MG tablet, Take 1 tablet (40 mg total) by mouth 2 (two) times daily., Disp: 180 tablet, Rfl: 3   HUMALOG KWIKPEN 100 UNIT/ML KwikPen, Inject into the skin., Disp: , Rfl:    hydrocortisone 2.5 % ointment, Apply topically 2 (two) times  daily., Disp: , Rfl:    hydrOXYzine (ATARAX/VISTARIL) 25 MG tablet, , Disp: , Rfl:    INPEN 100-GRAY-LILLY DEVI, as directed., Disp: , Rfl:    Insulin Pen Needle 31G X 6 MM MISC, SMARTSIG:1 Needle SUB-Q 4 Times Daily, Disp: , Rfl:    ipratropium-albuterol (DUONEB) 0.5-2.5 (3) MG/3ML SOLN, Inhale into the lungs., Disp: , Rfl:    JARDIANCE 25 MG TABS tablet, Take 25 mg by mouth daily., Disp: , Rfl:    LATUDA 40 MG TABS tablet, Take 40 mg by mouth at bedtime. (Patient not taking: Reported on 06/14/2021), Disp: , Rfl:    lidocaine (LIDODERM) 5 %, lidocaine 5 % topical patch  PLEASE SEE ATTACHED FOR DETAILED DIRECTIONS (Patient not taking: Reported on 06/14/2021), Disp: , Rfl:    LINZESS 290 MCG CAPS capsule, Take 290 mcg by mouth daily., Disp: , Rfl:    LORazepam (ATIVAN) 1 MG tablet, Take by mouth., Disp: , Rfl:    losartan (COZAAR) 25 MG tablet, Take 25 mg by mouth daily., Disp: , Rfl:    mirtazapine (REMERON SOL-TAB) 30 MG disintegrating tablet, Take 30 mg by mouth at bedtime as needed (Nap). , Disp: , Rfl: 2   montelukast (SINGULAIR) 10 MG tablet, Take 10 mg by mouth daily., Disp: , Rfl:    nabumetone (RELAFEN) 500 MG tablet,  Take by mouth., Disp: , Rfl:    NOVOLOG FLEXPEN 100 UNIT/ML FlexPen, Inject 10 Units into the skin daily. (Patient not taking: Reported on 06/14/2021), Disp: , Rfl:    omeprazole (PRILOSEC) 40 MG capsule, Take 40 mg by mouth daily., Disp: , Rfl:    potassium chloride SA (KLOR-CON) 20 MEQ tablet, Take 1 tablet (20 mEq total) by mouth daily., Disp: 90 tablet, Rfl: 3   pravastatin (PRAVACHOL) 40 MG tablet, Take 40 mg by mouth daily., Disp: , Rfl:    pregabalin (LYRICA) 150 MG capsule, Take 150 mg by mouth 3 (three) times daily., Disp: , Rfl:    PROAIR RESPICLICK 108 (90 Base) MCG/ACT AEPB, Inhale 2 puffs into the lungs every 4 (four) hours as needed., Disp: , Rfl:    propranolol ER (INDERAL LA) 80 MG 24 hr capsule, Take 1 capsule (80 mg total) by mouth daily., Disp: 90 capsule,  Rfl: 3   Semaglutide, 1 MG/DOSE, (OZEMPIC, 1 MG/DOSE,) 2 MG/1.5ML SOPN, Inject into the skin., Disp: , Rfl:    SUMAtriptan (IMITREX) 50 MG tablet, Take 1 tablet (50 mg total) by mouth every 2 (two) hours as needed for migraine. May repeat in 2 hours if headache persists or recurs.  Not to exceed 4 pills in a 24-hour period.  If you find you need more medication and you have taken 4 pills, please go directly to the emergency room or call 911. (Patient not taking: Reported on 06/14/2021), Disp: 10 tablet, Rfl: 0   tiotropium (SPIRIVA HANDIHALER) 18 MCG inhalation capsule, Place 1 capsule (18 mcg total) into inhaler and inhale daily., Disp: 30 capsule, Rfl: 6   tiZANidine (ZANAFLEX) 4 MG tablet, Take 4 mg by mouth every 8 (eight) hours as needed for muscle spasms. , Disp: , Rfl:    topiramate (TOPAMAX) 100 MG tablet, Take 100 mg by mouth 2 (two) times daily., Disp: , Rfl:    TRESIBA FLEXTOUCH 200 UNIT/ML FlexTouch Pen, 72 units per day seems very high for you.  Please follow up with your outpatient provider for further adjustment., Disp: , Rfl:    triamcinolone ointment (KENALOG) 0.1 %, Apply topically., Disp: , Rfl:    TRUEPLUS PEN NEEDLES 31G X 6 MM MISC, , Disp: , Rfl:    VRAYLAR 6 MG CAPS, Take 1 capsule by mouth daily., Disp: , Rfl:    zolpidem (AMBIEN) 10 MG tablet, Take 10 mg by mouth at bedtime., Disp: , Rfl:   Past Medical History: Past Medical History:  Diagnosis Date   ADD (attention deficit disorder)    Anxiety    Arthritis    knees, shoulder, upper back   Asthma    CFS (chronic fatigue syndrome)    Chewing difficulty    CHF (congestive heart failure) (HCC)    Chronic kidney disease    Constipation    Depression    Depression    Diabetes mellitus without complication (HCC)    Dyspnea    Environmental allergies    Fatty liver    Fibromyalgia    GERD (gastroesophageal reflux disease)    GI bleed 10/02/2018   Headache    migraines - 5x/mo   Hypertension    Hypokalemia  10/14/2017   Hypothyroidism    IBS (irritable bowel syndrome)    IDA (iron deficiency anemia) 02/23/2021   Joint pain    Lower extremity edema    Major depressive disorder, single episode    Migraine without aura and without status migrainosus, not intractable  Migraines    Motion sickness    ships   Osteoarthritis    Other specified disorders of thyroid    Sleep apnea    Stroke (HCC)    no residual deficits   Swallowing difficulty    Thyroid nodule    bilateral and goiter   Vitamin D deficiency    Wears contact lenses    Wears dentures    partial upper    Tobacco Use: Social History   Tobacco Use  Smoking Status Never  Smokeless Tobacco Never    Labs: Recent Review Flowsheet Data     Labs for ITP Cardiac and Pulmonary Rehab Latest Ref Rng & Units 08/05/2019 10/19/2019 01/11/2020 05/08/2020 09/13/2020   Cholestrol 0 - 200 mg/dL - 245(H) - 154 -   LDLCALC 0 - 99 mg/dL - 118(H) - 76 -   HDL >40 mg/dL - 87 - 57 -   Trlycerides <150 mg/dL - 237(H) - 105 -   Hemoglobin A1c 4.8 - 5.6 % 11.7(H) - 8.7(H) 8.4(H) 7.9(H)   HCO3 20.0 - 28.0 mmol/L - - - - -   ACIDBASEDEF 0.0 - 2.0 mmol/L - - - - -   O2SAT % - - - - -        Pulmonary Assessment Scores:  Pulmonary Assessment Scores     Row Name 08/20/21 1641         ADL UCSD   SOB Score total 90     Rest 3     Walk 4     Stairs 4     Bath 4     Dress 3     Shop 4       CAT Score   CAT Score 24       mMRC Score   mMRC Score 3              UCSD: Self-administered rating of dyspnea associated with activities of daily living (ADLs) 6-point scale (0 = "not at all" to 5 = "maximal or unable to do because of breathlessness")  Scoring Scores range from 0 to 120.  Minimally important difference is 5 units  CAT: CAT can identify the health impairment of COPD patients and is better correlated with disease progression.  CAT has a scoring range of zero to 40. The CAT score is classified into four groups of low  (less than 10), medium (10 - 20), high (21-30) and very high (31-40) based on the impact level of disease on health status. A CAT score over 10 suggests significant symptoms.  A worsening CAT score could be explained by an exacerbation, poor medication adherence, poor inhaler technique, or progression of COPD or comorbid conditions.  CAT MCID is 2 points  mMRC: mMRC (Modified Medical Research Council) Dyspnea Scale is used to assess the degree of baseline functional disability in patients of respiratory disease due to dyspnea. No minimal important difference is established. A decrease in score of 1 point or greater is considered a positive change.   Pulmonary Function Assessment:   Exercise Target Goals: Exercise Program Goal: Individual exercise prescription set using results from initial 6 min walk test and THRR while considering  patients activity barriers and safety.   Exercise Prescription Goal: Initial exercise prescription builds to 30-45 minutes a day of aerobic activity, 2-3 days per week.  Home exercise guidelines will be given to patient during program as part of exercise prescription that the participant will acknowledge.  Education: Aerobic Exercise: - Group verbal  and visual presentation on the components of exercise prescription. Introduces F.I.T.T principle from ACSM for exercise prescriptions.  Reviews F.I.T.T. principles of aerobic exercise including progression. Written material given at graduation.   Education: Resistance Exercise: - Group verbal and visual presentation on the components of exercise prescription. Introduces F.I.T.T principle from ACSM for exercise prescriptions  Reviews F.I.T.T. principles of resistance exercise including progression. Written material given at graduation.    Education: Exercise & Equipment Safety: - Individual verbal instruction and demonstration of equipment use and safety with use of the equipment. Flowsheet Row Pulmonary Rehab from  08/20/2021 in [facility] Cardiac and Pulmonary Rehab  Date 08/20/21  Educator [facility]  Instruction Review Code 1- Verbalizes Understanding       Education: Exercise Physiology & General Exercise Guidelines: - Group verbal and written instruction with models to review the exercise physiology of the cardiovascular system and associated critical values. Provides general exercise guidelines with specific guidelines to those with heart or lung disease.    Education: Flexibility, Balance, Mind/Body Relaxation: - Group verbal and visual presentation with interactive activity on the components of exercise prescription. Introduces F.I.T.T principle from ACSM for exercise prescriptions. Reviews F.I.T.T. principles of flexibility and balance exercise training including progression. Also discusses the mind body connection.  Reviews various relaxation techniques to help reduce and manage stress (i.e. Deep breathing, progressive muscle relaxation, and visualization). Balance handout provided to take home. Written material given at graduation.   Activity Barriers & Risk Stratification:  Activity Barriers & Cardiac Risk Stratification - 07/11/21 1142       Activity Barriers & Cardiac Risk Stratification   Activity Barriers Shortness of Breath;Fibromyalgia;Joint Problems;Deconditioning;Muscular Weakness             6 Minute Walk:  6 Minute Walk     Row Name 08/20/21 1614         6 Minute Walk   Phase Initial     Distance 1210 feet     Walk Time 6 minutes     # of Rest Breaks 0     MPH 2.29     METS 3.32     RPE 14     Perceived Dyspnea  3     VO2 Peak 11.63     Symptoms No     Resting HR 77 bpm     Resting BP 122/72     Resting Oxygen Saturation  97 %     Exercise Oxygen Saturation  during 6 min walk 91 %     Max Ex. HR 122 bpm     Max Ex. BP 142/82     2 Minute Post BP 130/82       Interval HR   1 Minute HR 108     2 Minute HR 113     3 Minute HR 113     4 Minute HR 113     5 Minute  HR 119     6 Minute HR 122     2 Minute Post HR 72     Interval Heart Rate? Yes       Interval Oxygen   Interval Oxygen? Yes     Baseline Oxygen Saturation % 97 %     1 Minute Oxygen Saturation % 91 %     1 Minute Liters of Oxygen 0 L     2 Minute Oxygen Saturation % 94 %     2 Minute Liters of Oxygen 0 L     3 Minute Oxygen  Saturation % 94 %     3 Minute Liters of Oxygen 0 L     4 Minute Oxygen Saturation % 94 %     4 Minute Liters of Oxygen 0 L     5 Minute Oxygen Saturation % 95 %     5 Minute Liters of Oxygen 0 L     6 Minute Oxygen Saturation % 95 %     6 Minute Liters of Oxygen 0 L     2 Minute Post Oxygen Saturation % 95 %     2 Minute Post Liters of Oxygen 122 L             Oxygen Initial Assessment:  Oxygen Initial Assessment - 07/11/21 1142       Home Oxygen   Home Oxygen Device None    Sleep Oxygen Prescription CPAP    Home Exercise Oxygen Prescription None    Home Resting Oxygen Prescription None      Intervention   Short Term Goals To learn and demonstrate proper use of respiratory medications;To learn and demonstrate proper pursed lip breathing techniques or other breathing techniques. ;To learn and understand importance of maintaining oxygen saturations>88%;To learn and understand importance of monitoring SPO2 with pulse oximeter and demonstrate accurate use of the pulse oximeter.    Long  Term Goals Verbalizes importance of monitoring SPO2 with pulse oximeter and return demonstration;Maintenance of O2 saturations>88%;Exhibits proper breathing techniques, such as pursed lip breathing or other method taught during program session;Compliance with respiratory medication;Demonstrates proper use of MDIs             Oxygen Re-Evaluation:   Oxygen Discharge (Final Oxygen Re-Evaluation):   Initial Exercise Prescription:  Initial Exercise Prescription - 08/20/21 1600       Date of Initial Exercise RX and Referring Provider   Date 08/20/21     Referring Provider Kasa      Oxygen   Maintain Oxygen Saturation 88% or higher      Treadmill   MPH 2    Grade 0    Minutes 15    METs 2.53      Recumbant Elliptical   Level 1    RPM 50    Minutes 15    METs 3.3      REL-XR   Level 1    Speed 50    Minutes 15    METs 3.3      Track   Laps 30    Minutes 15    METs 2.63      Prescription Details   Frequency (times per week) 3    Duration Progress to 30 minutes of continuous aerobic without signs/symptoms of physical distress      Intensity   THRR 40-80% of Max Heartrate 111-146    Ratings of Perceived Exertion 11-13    Perceived Dyspnea 0-4      Progression   Progression Continue to progress workloads to maintain intensity without signs/symptoms of physical distress.      Resistance Training   Training Prescription Yes    Weight 3    Reps 10-15             Perform Capillary Blood Glucose checks as needed.  Exercise Prescription Changes:   Exercise Prescription Changes     Row Name 08/20/21 1600             Response to Exercise   Blood Pressure (Admit) 122/72       Blood Pressure (  Exercise) 142/82       Blood Pressure (Exit) 130/82       Heart Rate (Admit) 77 bpm       Heart Rate (Exercise) 122 bpm       Heart Rate (Exit) 72 bpm       Oxygen Saturation (Admit) 97 %       Oxygen Saturation (Exercise) 91 %       Oxygen Saturation (Exit) 95 %       Rating of Perceived Exertion (Exercise) 14       Perceived Dyspnea (Exercise) 3       Symptoms none       Comments 6 min walk results                Exercise Comments:   Exercise Goals and Review:   Exercise Goals     Row Name 08/20/21 1631             Exercise Goals   Increase Physical Activity Yes       Intervention Develop an individualized exercise prescription for aerobic and resistive training based on initial evaluation findings, risk stratification, comorbidities and participant's personal goals.;Provide advice, education,  support and counseling about physical activity/exercise needs.       Expected Outcomes Short Term: Attend rehab on a regular basis to increase amount of physical activity.;Long Term: Add in home exercise to make exercise part of routine and to increase amount of physical activity.;Long Term: Exercising regularly at least 3-5 days a week.       Increase Strength and Stamina Yes       Intervention Provide advice, education, support and counseling about physical activity/exercise needs.;Develop an individualized exercise prescription for aerobic and resistive training based on initial evaluation findings, risk stratification, comorbidities and participant's personal goals.       Expected Outcomes Short Term: Increase workloads from initial exercise prescription for resistance, speed, and METs.;Short Term: Perform resistance training exercises routinely during rehab and add in resistance training at home;Long Term: Improve cardiorespiratory fitness, muscular endurance and strength as measured by increased METs and functional capacity ( )       Able to understand and use rate of perceived exertion (RPE) scale Yes       Intervention Provide education and explanation on how to use RPE scale       Expected Outcomes Short Term: Able to use RPE daily in rehab to express subjective intensity level;Long Term:  Able to use RPE to guide intensity level when exercising independently       Able to understand and use Dyspnea scale Yes       Intervention Provide education and explanation on how to use Dyspnea scale       Expected Outcomes Short Term: Able to use Dyspnea scale daily in rehab to express subjective sense of shortness of breath during exertion;Long Term: Able to use Dyspnea scale to guide intensity level when exercising independently       Knowledge and understanding of Target Heart Rate Range (THRR) Yes       Intervention Provide education and explanation of THRR including how the numbers were predicted  and where they are located for reference       Expected Outcomes Short Term: Able to state/look up THRR;Long Term: Able to use THRR to govern intensity when exercising independently;Short Term: Able to use daily as guideline for intensity in rehab       Able to check pulse independently Yes  Intervention Provide education and demonstration on how to check pulse in carotid and radial arteries.;Review the importance of being able to check your own pulse for safety during independent exercise       Expected Outcomes Short Term: Able to explain why pulse checking is important during independent exercise;Long Term: Able to check pulse independently and accurately       Understanding of Exercise Prescription Yes       Intervention Provide education, explanation, and written materials on patient's individual exercise prescription       Expected Outcomes Short Term: Able to explain program exercise prescription;Long Term: Able to explain home exercise prescription to exercise independently                Exercise Goals Re-Evaluation :   Discharge Exercise Prescription (Final Exercise Prescription Changes):  Exercise Prescription Changes - 08/20/21 1600       Response to Exercise   Blood Pressure (Admit) 122/72    Blood Pressure (Exercise) 142/82    Blood Pressure (Exit) 130/82    Heart Rate (Admit) 77 bpm    Heart Rate (Exercise) 122 bpm    Heart Rate (Exit) 72 bpm    Oxygen Saturation (Admit) 97 %    Oxygen Saturation (Exercise) 91 %    Oxygen Saturation (Exit) 95 %    Rating of Perceived Exertion (Exercise) 14    Perceived Dyspnea (Exercise) 3    Symptoms none    Comments 6 min walk results             Nutrition:  Target Goals: Understanding of nutrition guidelines, daily intake of sodium 1500mg , cholesterol 200mg , calories 30% from fat and 7% or less from saturated fats, daily to have 5 or more servings of fruits and vegetables.  Education: All About Nutrition: -Group  instruction provided by verbal, written material, interactive activities, discussions, models, and posters to present general guidelines for heart healthy nutrition including fat, fiber, MyPlate, the role of sodium in heart healthy nutrition, utilization of the nutrition label, and utilization of this knowledge for meal planning. Follow up email sent as well. Written material given at graduation.   Biometrics:  Pre Biometrics - 08/20/21 1632       Pre Biometrics   Height 5' 5.5" (1.664 m)    Weight 219 lb 6.4 oz (99.5 kg)    BMI (Calculated) 35.94    Single Leg Stand 21.22 seconds              Nutrition Therapy Plan and Nutrition Goals:  Nutrition Therapy & Goals - 08/20/21 1635       Intervention Plan   Intervention Prescribe, educate and counsel regarding individualized specific dietary modifications aiming towards targeted core components such as weight, hypertension, lipid management, diabetes, heart failure and other comorbidities.    Expected Outcomes Short Term Goal: Understand basic principles of dietary content, such as calories, fat, sodium, cholesterol and nutrients.;Short Term Goal: A plan has been developed with personal nutrition goals set during dietitian appointment.;Long Term Goal: Adherence to prescribed nutrition plan.             Nutrition Assessments:  MEDIFICTS Score Key: ?70 Need to make dietary changes  40-70 Heart Healthy Diet ? 40 Therapeutic Level Cholesterol Diet  Flowsheet Row Pulmonary Rehab from 08/20/2021 in [facility] Cardiac and Pulmonary Rehab  Picture Your Plate Total Score on Admission 49      Picture Your Plate Scores: [id number] Unhealthy dietary pattern with much room for improvement. 41-50 Dietary  pattern unlikely to meet recommendations for good health and room for improvement. 51-60 More healthful dietary pattern, with some room for improvement.  >60 Healthy dietary pattern, although there may be some specific behaviors that could be  improved.   Nutrition Goals Re-Evaluation:   Nutrition Goals Discharge (Final Nutrition Goals Re-Evaluation):   Psychosocial: Target Goals: Acknowledge presence or absence of significant depression and/or stress, maximize coping skills, provide positive support system. Participant is able to verbalize types and ability to use techniques and skills needed for reducing stress and depression.   Education: Stress, Anxiety, and Depression - Group verbal and visual presentation to define topics covered.  Reviews how body is impacted by stress, anxiety, and depression.  Also discusses healthy ways to reduce stress and to treat/manage anxiety and depression.  Written material given at graduation.   Education: Sleep Hygiene -Provides group verbal and written instruction about how sleep can affect your health.  Define sleep hygiene, discuss sleep cycles and impact of sleep habits. Review good sleep hygiene tips.    Initial Review & Psychosocial Screening:  Initial Psych Review & Screening - 07/11/21 1153       Initial Review   Current issues with Current Sleep Concerns;Current Stress Concerns;History of Depression    Source of Stress Concerns Chronic Illness      Family Dynamics   Good Support System? Yes      Screening Interventions   Interventions Encouraged to exercise;To provide support and resources with identified psychosocial needs;Provide feedback about the scores to participant    Expected Outcomes Long Term Goal: Stressors or current issues are controlled or eliminated.;Short Term goal: Utilizing psychosocial counselor, staff and physician to assist with identification of specific Stressors or current issues interfering with healing process. Setting desired goal for each stressor or current issue identified.;Short Term goal: Identification and review with participant of any Quality of Life or Depression concerns found by scoring the questionnaire.;Long Term goal: The participant  improves quality of Life and PHQ9 Scores as seen by post scores and/or verbalization of changes             Quality of Life Scores:  Scores of 19 and below usually indicate a poorer quality of life in these areas.  A difference of  2-3 points is a clinically meaningful difference.  A difference of 2-3 points in the total score of the Quality of Life Index has been associated with significant improvement in overall quality of life, self-image, physical symptoms, and general health in studies assessing change in quality of life.  PHQ-9: Recent Review Flowsheet Data     Depression screen [facility] 2/9 08/20/2021 11/16/2020 10/16/2020 07/07/2020 10/19/2019   Decreased Interest 1 0 0 1 3   Down, Depressed, Hopeless 1 0 0 0 3   PHQ - 2 Score 2 0 0 1 6   Altered sleeping 3 - - - 3   Tired, decreased energy 1 - - - 3   Change in appetite 2 - - - 3   Feeling bad or failure about yourself  3 - - - 2   Trouble concentrating 3 - - - 2   Moving slowly or fidgety/restless 0 - - - 3   Suicidal thoughts 0 - - - 2   PHQ-9 Score 14 - - - 24   Difficult doing work/chores Extremely dIfficult - - - Very difficult      Interpretation of Total Score  Total Score Depression Severity:  1-4 = Minimal depression, 5-9 = Mild  depression, 10-14 = Moderate depression, 15-19 = Moderately severe depression, 20-27 = Severe depression   Psychosocial Evaluation and Intervention:  Psychosocial Evaluation - 07/11/21 1156       Psychosocial Evaluation & Interventions   Interventions Encouraged to exercise with the program and follow exercise prescription;Relaxation education    Comments Amanda [name] has multiple health issues including sleep apnea, asthma, diabetes, and heart failure. She states she feels like her symptoms are being addressed. She does continue to have insomnia issues which medication helps some, but most nights are restless. She has a history of PTSD as well. She has had therapy in the past and knows when to  reach out to address worsening issues/symptoms. She is coming to the program with her daughter who also has diastolic heart failure in hopes to get back on track. They both came to the program in 2018 but were unable to complete due to work scheduling.    Expected Outcomes Short: attend pulmonary rehab for education and exercise. Long: develop and maintain positive self care habits.    Continue Psychosocial Services  Follow up required by staff             Psychosocial Re-Evaluation:   Psychosocial Discharge (Final Psychosocial Re-Evaluation):   Education: Education Goals: Education classes will be provided on a weekly basis, covering required topics. Participant will state understanding/return demonstration of topics presented.  Learning Barriers/Preferences:  Learning Barriers/Preferences - 07/11/21 1153       Learning Barriers/Preferences   Learning Barriers None    Learning Preferences None             General Pulmonary Education Topics:  Infection Prevention: - Provides verbal and written material to individual with discussion of infection control including proper hand washing and proper equipment cleaning during exercise session. Flowsheet Row Pulmonary Rehab from 08/20/2021 in [facility] Cardiac and Pulmonary Rehab  Date 08/20/21  Educator [facility]  Instruction Review Code 1- Verbalizes Understanding       Falls Prevention: - Provides verbal and written material to individual with discussion of falls prevention and safety. Flowsheet Row Pulmonary Rehab from 08/20/2021 in [facility] Cardiac and Pulmonary Rehab  Date 08/20/21  Educator [facility]  Instruction Review Code 1- Verbalizes Understanding       Chronic Lung Disease Review: - Group verbal instruction with posters, models, PowerPoint presentations and videos,  to review new updates, new respiratory medications, new advancements in procedures and treatments. Providing information on websites and "800" numbers for continued  self-education. Includes information about supplement oxygen, available portable oxygen systems, continuous and intermittent flow rates, oxygen safety, concentrators, and Medicare reimbursement for oxygen. Explanation of Pulmonary Drugs, including class, frequency, complications, importance of spacers, rinsing mouth after steroid MDI's, and proper cleaning methods for nebulizers. Review of basic lung anatomy and physiology related to function, structure, and complications of lung disease. Review of risk factors. Discussion about methods for diagnosing sleep apnea and types of masks and machines for OSA. Includes a review of the use of types of environmental controls: home humidity, furnaces, filters, dust mite/pet prevention, HEPA vacuums. Discussion about weather changes, air quality and the benefits of nasal washing. Instruction on Warning signs, infection symptoms, calling MD promptly, preventive modes, and value of vaccinations. Review of effective airway clearance, coughing and/or vibration techniques. Emphasizing that all should Create an Action Plan. Written material given at graduation.   AED/CPR: - Group verbal and written instruction with the use of models to demonstrate the basic use of the AED with the  basic ABC's of resuscitation.    Anatomy and Cardiac Procedures: - Group verbal and visual presentation and models provide information about basic cardiac anatomy and function. Reviews the testing methods done to diagnose heart disease and the outcomes of the test results. Describes the treatment choices: Medical Management, Angioplasty, or Coronary Bypass Surgery for treating various heart conditions including Myocardial Infarction, Angina, Valve Disease, and Cardiac Arrhythmias.  Written material given at graduation.   Medication Safety: - Group verbal and visual instruction to review commonly prescribed medications for heart and lung disease. Reviews the medication, class of the drug, and  side effects. Includes the steps to properly store meds and maintain the prescription regimen.  Written material given at graduation.   Other: -Provides group and verbal instruction on various topics (see comments)   Knowledge Questionnaire Score:  Knowledge Questionnaire Score - 08/20/21 1637       Knowledge Questionnaire Score   Pre Score 18/18              Core Components/Risk Factors/Patient Goals at Admission:  Personal Goals and Risk Factors at Admission - 08/20/21 1633       Core Components/Risk Factors/Patient Goals on Admission    Weight Management Yes;Weight Loss    Intervention Weight Management: Provide education and appropriate resources to help participant work on and attain dietary goals.;Weight Management: Develop a combined nutrition and exercise program designed to reach desired caloric intake, while maintaining appropriate intake of nutrient and fiber, sodium and fats, and appropriate energy expenditure required for the weight goal.    Admit Weight 219 lb 6.4 oz (99.5 kg)    Goal Weight: Short Term 200 lb (90.7 kg)    Goal Weight: Long Term 145 lb (65.8 kg)    Expected Outcomes Long Term: Adherence to nutrition and physical activity/exercise program aimed toward attainment of established weight goal;Short Term: Continue to assess and modify interventions until short term weight is achieved;Weight Loss: Understanding of general recommendations for a balanced deficit meal plan, which promotes 1-2 lb weight loss per week and includes a negative energy balance of [id number] kcal/d;Understanding recommendations for meals to include 15-35% energy as protein, 25-35% energy from fat, 35-60% energy from carbohydrates, less than 200mg  of dietary cholesterol, 20-35 gm of total fiber daily;Understanding of distribution of calorie intake throughout the day with the consumption of 4-5 meals/snacks    Improve shortness of breath with ADL's Yes    Intervention Provide education,  individualized exercise plan and daily activity instruction to help decrease symptoms of SOB with activities of daily living.    Expected Outcomes Short Term: Improve cardiorespiratory fitness to achieve a reduction of symptoms when performing ADLs;Long Term: Be able to perform more ADLs without symptoms or delay the onset of symptoms    Increase knowledge of respiratory medications and ability to use respiratory devices properly  Yes    Intervention Provide education and demonstration as needed of appropriate use of medications, inhalers, and oxygen therapy.    Expected Outcomes Short Term: Achieves understanding of medications use. Understands that oxygen is a medication prescribed by physician. Demonstrates appropriate use of inhaler and oxygen therapy.    Diabetes Yes    Intervention Provide education about signs/symptoms and action to take for hypo/hyperglycemia.;Provide education about proper nutrition, including hydration, and aerobic/resistive exercise prescription along with prescribed medications to achieve blood glucose in normal ranges: Fasting glucose 65-99 mg/dL    Expected Outcomes Short Term: Participant verbalizes understanding of the signs/symptoms and immediate care of hyper/hypoglycemia, proper  foot care and importance of medication, aerobic/resistive exercise and nutrition plan for blood glucose control.;Long Term: Attainment of HbA1C < 7%.    Hypertension Yes    Intervention Provide education on lifestyle modifcations including regular physical activity/exercise, weight management, moderate sodium restriction and increased consumption of fresh fruit, vegetables, and low fat dairy, alcohol moderation, and smoking cessation.;Monitor prescription use compliance.    Expected Outcomes Short Term: Continued assessment and intervention until BP is < 140/83mm HG in hypertensive participants. < 130/6mm HG in hypertensive participants with diabetes, heart failure or chronic kidney  disease.;Long Term: Maintenance of blood pressure at goal levels.    Lipids Yes    Intervention Provide education and support for participant on nutrition & aerobic/resistive exercise along with prescribed medications to achieve LDL 70mg , HDL >40mg .    Expected Outcomes Short Term: Participant states understanding of desired cholesterol values and is compliant with medications prescribed. Participant is following exercise prescription and nutrition guidelines.;Long Term: Cholesterol controlled with medications as prescribed, with individualized exercise RX and with personalized nutrition plan. Value goals: LDL < 70mg , HDL > 40 mg.             Education:Diabetes - Individual verbal and written instruction to review signs/symptoms of diabetes, desired ranges of glucose level fasting, after meals and with exercise. Acknowledge that pre and post exercise glucose checks will be done for 3 sessions at entry of program. Flowsheet Row Pulmonary Rehab from 07/11/2021 in [facility] Cardiac and Pulmonary Rehab  Date 07/11/21  Educator [facility]  Instruction Review Code 1- Verbalizes Understanding       Know Your Numbers and Heart Failure: - Group verbal and visual instruction to discuss disease risk factors for cardiac and pulmonary disease and treatment options.  Reviews associated critical values for Overweight/Obesity, Hypertension, Cholesterol, and Diabetes.  Discusses basics of heart failure: signs/symptoms and treatments.  Introduces Heart Failure Zone chart for action plan for heart failure.  Written material given at graduation.   Core Components/Risk Factors/Patient Goals Review:    Core Components/Risk Factors/Patient Goals at Discharge (Final Review):    ITP Comments:  ITP Comments     Row Name 07/11/21 1138 08/29/21 0828 09/10/21 0936 09/25/21 0758     ITP Comments Initial telephone orientation completed. Diagnosis can be found in [facility] 9/22. EP orientation scheduled for Wednesday 11/23 at  9:30am. 30 Day review completed. Medical Director ITP review done, changes made as directed, and signed approval by Medical Director. Katalyn has not attended rehab since her day of orientation on 12/19. Staff has attempted to follow up with patient. Will continue to make contact. Pt has not returned since orientation and not returning contact.  We will discharge her today.             Comments: Discharge ITP

## 2021-09-26 ENCOUNTER — Encounter: Payer: Self-pay | Admitting: *Deleted

## 2021-09-26 DIAGNOSIS — I5032 Chronic diastolic (congestive) heart failure: Secondary | ICD-10-CM

## 2021-09-26 NOTE — Progress Notes (Signed)
Pulmonary Individual Treatment Plan  Patient Details  Name: Amanda [name] MRN: [id number] Date of Birth: Jul 08, 1966 Referring Provider:   Flowsheet Row Pulmonary Rehab from 08/20/2021 in [facility] Cardiac and Pulmonary Rehab  Referring Provider Kasa       Initial Encounter Date:  Flowsheet Row Pulmonary Rehab from 08/20/2021 in [facility] Cardiac and Pulmonary Rehab  Date 08/20/21       Visit Diagnosis: Chronic diastolic HF (heart failure) ([place])  Patient's Home Medications on Admission:  Current Outpatient Medications:    ACCU-CHEK GUIDE test strip, , Disp: , Rfl:    albuterol (VENTOLIN HFA) 108 (90 Base) MCG/ACT inhaler, Inhale 2 puffs into the lungs every 6 (six) hours as needed for wheezing or shortness of breath., Disp: 8 g, Rfl: 6   Ascorbic Acid (VITAMIN C CR) 1000 MG TBCR, Take 1 tablet by mouth daily., Disp: , Rfl:    B-D UF III MINI PEN NEEDLES 31G X 5 MM MISC, Inject into the skin daily., Disp: , Rfl:    BD PEN NEEDLE MICRO U/F 32G X 6 MM MISC, Inject into the skin., Disp: , Rfl:    buprenorphine (BUTRANS) 10 MCG/HR PTWK, , Disp: , Rfl:    cholecalciferol (VITAMIN D) 25 MCG (1000 UNIT) tablet, Take 1,000 Units by mouth daily., Disp: , Rfl:    desvenlafaxine (PRISTIQ) 50 MG 24 hr tablet, Take 50 mg by mouth daily., Disp: , Rfl:    Dexmethylphenidate HCl 25 MG CP24, Take 1 capsule by mouth every morning., Disp: , Rfl:    estradiol (ESTRACE) 0.1 MG/GM vaginal cream, , Disp: , Rfl:    ferrous sulfate 324 MG TBEC, Take 324 mg by mouth daily. (Patient not taking: Reported on 06/14/2021), Disp: , Rfl:    flunisolide (NASALIDE) 25 MCG/ACT (0.025%) SOLN, Place into the nose. (Patient not taking: Reported on 06/14/2021), Disp: , Rfl:    furosemide (LASIX) 40 MG tablet, Take 1 tablet (40 mg total) by mouth 2 (two) times daily., Disp: 180 tablet, Rfl: 3   HUMALOG KWIKPEN 100 UNIT/ML KwikPen, Inject into the skin., Disp: , Rfl:    hydrocortisone 2.5 % ointment, Apply topically 2 (two) times  daily., Disp: , Rfl:    hydrOXYzine (ATARAX/VISTARIL) 25 MG tablet, , Disp: , Rfl:    INPEN 100-GRAY-LILLY DEVI, as directed., Disp: , Rfl:    Insulin Pen Needle 31G X 6 MM MISC, SMARTSIG:1 Needle SUB-Q 4 Times Daily, Disp: , Rfl:    ipratropium-albuterol (DUONEB) 0.5-2.5 (3) MG/3ML SOLN, Inhale into the lungs., Disp: , Rfl:    JARDIANCE 25 MG TABS tablet, Take 25 mg by mouth daily., Disp: , Rfl:    LATUDA 40 MG TABS tablet, Take 40 mg by mouth at bedtime. (Patient not taking: Reported on 06/14/2021), Disp: , Rfl:    lidocaine (LIDODERM) 5 %, lidocaine 5 % topical patch  PLEASE SEE ATTACHED FOR DETAILED DIRECTIONS (Patient not taking: Reported on 06/14/2021), Disp: , Rfl:    LINZESS 290 MCG CAPS capsule, Take 290 mcg by mouth daily., Disp: , Rfl:    LORazepam (ATIVAN) 1 MG tablet, Take by mouth., Disp: , Rfl:    losartan (COZAAR) 25 MG tablet, Take 25 mg by mouth daily., Disp: , Rfl:    mirtazapine (REMERON SOL-TAB) 30 MG disintegrating tablet, Take 30 mg by mouth at bedtime as needed (Nap). , Disp: , Rfl: 2   montelukast (SINGULAIR) 10 MG tablet, Take 10 mg by mouth daily., Disp: , Rfl:    nabumetone (RELAFEN) 500 MG tablet,  Take by mouth., Disp: , Rfl:    NOVOLOG FLEXPEN 100 UNIT/ML FlexPen, Inject 10 Units into the skin daily. (Patient not taking: Reported on 06/14/2021), Disp: , Rfl:    omeprazole (PRILOSEC) 40 MG capsule, Take 40 mg by mouth daily., Disp: , Rfl:    potassium chloride SA (KLOR-CON) 20 MEQ tablet, Take 1 tablet (20 mEq total) by mouth daily., Disp: 90 tablet, Rfl: 3   pravastatin (PRAVACHOL) 40 MG tablet, Take 40 mg by mouth daily., Disp: , Rfl:    pregabalin (LYRICA) 150 MG capsule, Take 150 mg by mouth 3 (three) times daily., Disp: , Rfl:    PROAIR RESPICLICK 108 (90 Base) MCG/ACT AEPB, Inhale 2 puffs into the lungs every 4 (four) hours as needed., Disp: , Rfl:    propranolol ER (INDERAL LA) 80 MG 24 hr capsule, Take 1 capsule (80 mg total) by mouth daily., Disp: 90 capsule,  Rfl: 3   Semaglutide, 1 MG/DOSE, (OZEMPIC, 1 MG/DOSE,) 2 MG/1.5ML SOPN, Inject into the skin., Disp: , Rfl:    SUMAtriptan (IMITREX) 50 MG tablet, Take 1 tablet (50 mg total) by mouth every 2 (two) hours as needed for migraine. May repeat in 2 hours if headache persists or recurs.  Not to exceed 4 pills in a 24-hour period.  If you find you need more medication and you have taken 4 pills, please go directly to the emergency room or call 911. (Patient not taking: Reported on 06/14/2021), Disp: 10 tablet, Rfl: 0   tiotropium (SPIRIVA HANDIHALER) 18 MCG inhalation capsule, Place 1 capsule (18 mcg total) into inhaler and inhale daily., Disp: 30 capsule, Rfl: 6   tiZANidine (ZANAFLEX) 4 MG tablet, Take 4 mg by mouth every 8 (eight) hours as needed for muscle spasms. , Disp: , Rfl:    topiramate (TOPAMAX) 100 MG tablet, Take 100 mg by mouth 2 (two) times daily., Disp: , Rfl:    TRESIBA FLEXTOUCH 200 UNIT/ML FlexTouch Pen, 72 units per day seems very high for you.  Please follow up with your outpatient provider for further adjustment., Disp: , Rfl:    triamcinolone ointment (KENALOG) 0.1 %, Apply topically., Disp: , Rfl:    TRUEPLUS PEN NEEDLES 31G X 6 MM MISC, , Disp: , Rfl:    VRAYLAR 6 MG CAPS, Take 1 capsule by mouth daily., Disp: , Rfl:    zolpidem (AMBIEN) 10 MG tablet, Take 10 mg by mouth at bedtime., Disp: , Rfl:   Past Medical History: Past Medical History:  Diagnosis Date   ADD (attention deficit disorder)    Anxiety    Arthritis    knees, shoulder, upper back   Asthma    CFS (chronic fatigue syndrome)    Chewing difficulty    CHF (congestive heart failure) (HCC)    Chronic kidney disease    Constipation    Depression    Depression    Diabetes mellitus without complication (HCC)    Dyspnea    Environmental allergies    Fatty liver    Fibromyalgia    GERD (gastroesophageal reflux disease)    GI bleed 10/02/2018   Headache    migraines - 5x/mo   Hypertension    Hypokalemia  10/14/2017   Hypothyroidism    IBS (irritable bowel syndrome)    IDA (iron deficiency anemia) 02/23/2021   Joint pain    Lower extremity edema    Major depressive disorder, single episode    Migraine without aura and without status migrainosus, not intractable  Migraines    Motion sickness    ships   Osteoarthritis    Other specified disorders of thyroid    Sleep apnea    Stroke (HCC)    no residual deficits   Swallowing difficulty    Thyroid nodule    bilateral and goiter   Vitamin D deficiency    Wears contact lenses    Wears dentures    partial upper    Tobacco Use: Social History   Tobacco Use  Smoking Status Never  Smokeless Tobacco Never    Labs: Recent Review Flowsheet Data     Labs for ITP Cardiac and Pulmonary Rehab Latest Ref Rng & Units 08/05/2019 10/19/2019 01/11/2020 05/08/2020 09/13/2020   Cholestrol 0 - 200 mg/dL - 245(H) - 154 -   LDLCALC 0 - 99 mg/dL - 118(H) - 76 -   HDL >40 mg/dL - 87 - 57 -   Trlycerides <150 mg/dL - 237(H) - 105 -   Hemoglobin A1c 4.8 - 5.6 % 11.7(H) - 8.7(H) 8.4(H) 7.9(H)   HCO3 20.0 - 28.0 mmol/L - - - - -   ACIDBASEDEF 0.0 - 2.0 mmol/L - - - - -   O2SAT % - - - - -        Pulmonary Assessment Scores:  Pulmonary Assessment Scores     Row Name 08/20/21 1641         ADL UCSD   SOB Score total 90     Rest 3     Walk 4     Stairs 4     Bath 4     Dress 3     Shop 4       CAT Score   CAT Score 24       mMRC Score   mMRC Score 3              UCSD: Self-administered rating of dyspnea associated with activities of daily living (ADLs) 6-point scale (0 = "not at all" to 5 = "maximal or unable to do because of breathlessness")  Scoring Scores range from 0 to 120.  Minimally important difference is 5 units  CAT: CAT can identify the health impairment of COPD patients and is better correlated with disease progression.  CAT has a scoring range of zero to 40. The CAT score is classified into four groups of low  (less than 10), medium (10 - 20), high (21-30) and very high (31-40) based on the impact level of disease on health status. A CAT score over 10 suggests significant symptoms.  A worsening CAT score could be explained by an exacerbation, poor medication adherence, poor inhaler technique, or progression of COPD or comorbid conditions.  CAT MCID is 2 points  mMRC: mMRC (Modified Medical Research Council) Dyspnea Scale is used to assess the degree of baseline functional disability in patients of respiratory disease due to dyspnea. No minimal important difference is established. A decrease in score of 1 point or greater is considered a positive change.   Pulmonary Function Assessment:   Exercise Target Goals: Exercise Program Goal: Individual exercise prescription set using results from initial 6 min walk test and THRR while considering  patients activity barriers and safety.   Exercise Prescription Goal: Initial exercise prescription builds to 30-45 minutes a day of aerobic activity, 2-3 days per week.  Home exercise guidelines will be given to patient during program as part of exercise prescription that the participant will acknowledge.  Education: Aerobic Exercise: - Group verbal  and visual presentation on the components of exercise prescription. Introduces F.I.T.T principle from ACSM for exercise prescriptions.  Reviews F.I.T.T. principles of aerobic exercise including progression. Written material given at graduation.   Education: Resistance Exercise: - Group verbal and visual presentation on the components of exercise prescription. Introduces F.I.T.T principle from ACSM for exercise prescriptions  Reviews F.I.T.T. principles of resistance exercise including progression. Written material given at graduation.    Education: Exercise & Equipment Safety: - Individual verbal instruction and demonstration of equipment use and safety with use of the equipment. Flowsheet Row Pulmonary Rehab from  08/20/2021 in [facility] Cardiac and Pulmonary Rehab  Date 08/20/21  Educator [facility]  Instruction Review Code 1- Verbalizes Understanding       Education: Exercise Physiology & General Exercise Guidelines: - Group verbal and written instruction with models to review the exercise physiology of the cardiovascular system and associated critical values. Provides general exercise guidelines with specific guidelines to those with heart or lung disease.    Education: Flexibility, Balance, Mind/Body Relaxation: - Group verbal and visual presentation with interactive activity on the components of exercise prescription. Introduces F.I.T.T principle from ACSM for exercise prescriptions. Reviews F.I.T.T. principles of flexibility and balance exercise training including progression. Also discusses the mind body connection.  Reviews various relaxation techniques to help reduce and manage stress (i.e. Deep breathing, progressive muscle relaxation, and visualization). Balance handout provided to take home. Written material given at graduation.   Activity Barriers & Risk Stratification:  Activity Barriers & Cardiac Risk Stratification - 07/11/21 1142       Activity Barriers & Cardiac Risk Stratification   Activity Barriers Shortness of Breath;Fibromyalgia;Joint Problems;Deconditioning;Muscular Weakness             6 Minute Walk:  6 Minute Walk     Row Name 08/20/21 1614         6 Minute Walk   Phase Initial     Distance 1210 feet     Walk Time 6 minutes     # of Rest Breaks 0     MPH 2.29     METS 3.32     RPE 14     Perceived Dyspnea  3     VO2 Peak 11.63     Symptoms No     Resting HR 77 bpm     Resting BP 122/72     Resting Oxygen Saturation  97 %     Exercise Oxygen Saturation  during 6 min walk 91 %     Max Ex. HR 122 bpm     Max Ex. BP 142/82     2 Minute Post BP 130/82       Interval HR   1 Minute HR 108     2 Minute HR 113     3 Minute HR 113     4 Minute HR 113     5 Minute  HR 119     6 Minute HR 122     2 Minute Post HR 72     Interval Heart Rate? Yes       Interval Oxygen   Interval Oxygen? Yes     Baseline Oxygen Saturation % 97 %     1 Minute Oxygen Saturation % 91 %     1 Minute Liters of Oxygen 0 L     2 Minute Oxygen Saturation % 94 %     2 Minute Liters of Oxygen 0 L     3 Minute Oxygen  Saturation % 94 %     3 Minute Liters of Oxygen 0 L     4 Minute Oxygen Saturation % 94 %     4 Minute Liters of Oxygen 0 L     5 Minute Oxygen Saturation % 95 %     5 Minute Liters of Oxygen 0 L     6 Minute Oxygen Saturation % 95 %     6 Minute Liters of Oxygen 0 L     2 Minute Post Oxygen Saturation % 95 %     2 Minute Post Liters of Oxygen 122 L             Oxygen Initial Assessment:  Oxygen Initial Assessment - 07/11/21 1142       Home Oxygen   Home Oxygen Device None    Sleep Oxygen Prescription CPAP    Home Exercise Oxygen Prescription None    Home Resting Oxygen Prescription None      Intervention   Short Term Goals To learn and demonstrate proper use of respiratory medications;To learn and demonstrate proper pursed lip breathing techniques or other breathing techniques. ;To learn and understand importance of maintaining oxygen saturations>88%;To learn and understand importance of monitoring SPO2 with pulse oximeter and demonstrate accurate use of the pulse oximeter.    Long  Term Goals Verbalizes importance of monitoring SPO2 with pulse oximeter and return demonstration;Maintenance of O2 saturations>88%;Exhibits proper breathing techniques, such as pursed lip breathing or other method taught during program session;Compliance with respiratory medication;Demonstrates proper use of MDIs             Oxygen Re-Evaluation:   Oxygen Discharge (Final Oxygen Re-Evaluation):   Initial Exercise Prescription:  Initial Exercise Prescription - 08/20/21 1600       Date of Initial Exercise RX and Referring Provider   Date 08/20/21     Referring Provider Kasa      Oxygen   Maintain Oxygen Saturation 88% or higher      Treadmill   MPH 2    Grade 0    Minutes 15    METs 2.53      Recumbant Elliptical   Level 1    RPM 50    Minutes 15    METs 3.3      REL-XR   Level 1    Speed 50    Minutes 15    METs 3.3      Track   Laps 30    Minutes 15    METs 2.63      Prescription Details   Frequency (times per week) 3    Duration Progress to 30 minutes of continuous aerobic without signs/symptoms of physical distress      Intensity   THRR 40-80% of Max Heartrate 111-146    Ratings of Perceived Exertion 11-13    Perceived Dyspnea 0-4      Progression   Progression Continue to progress workloads to maintain intensity without signs/symptoms of physical distress.      Resistance Training   Training Prescription Yes    Weight 3    Reps 10-15             Perform Capillary Blood Glucose checks as needed.  Exercise Prescription Changes:   Exercise Prescription Changes     Row Name 08/20/21 1600             Response to Exercise   Blood Pressure (Admit) 122/72       Blood Pressure (  Exercise) 142/82       Blood Pressure (Exit) 130/82       Heart Rate (Admit) 77 bpm       Heart Rate (Exercise) 122 bpm       Heart Rate (Exit) 72 bpm       Oxygen Saturation (Admit) 97 %       Oxygen Saturation (Exercise) 91 %       Oxygen Saturation (Exit) 95 %       Rating of Perceived Exertion (Exercise) 14       Perceived Dyspnea (Exercise) 3       Symptoms none       Comments 6 min walk results                Exercise Comments:   Exercise Goals and Review:   Exercise Goals     Row Name 08/20/21 1631             Exercise Goals   Increase Physical Activity Yes       Intervention Develop an individualized exercise prescription for aerobic and resistive training based on initial evaluation findings, risk stratification, comorbidities and participant's personal goals.;Provide advice, education,  support and counseling about physical activity/exercise needs.       Expected Outcomes Short Term: Attend rehab on a regular basis to increase amount of physical activity.;Long Term: Add in home exercise to make exercise part of routine and to increase amount of physical activity.;Long Term: Exercising regularly at least 3-5 days a week.       Increase Strength and Stamina Yes       Intervention Provide advice, education, support and counseling about physical activity/exercise needs.;Develop an individualized exercise prescription for aerobic and resistive training based on initial evaluation findings, risk stratification, comorbidities and participant's personal goals.       Expected Outcomes Short Term: Increase workloads from initial exercise prescription for resistance, speed, and METs.;Short Term: Perform resistance training exercises routinely during rehab and add in resistance training at home;Long Term: Improve cardiorespiratory fitness, muscular endurance and strength as measured by increased METs and functional capacity ( )       Able to understand and use rate of perceived exertion (RPE) scale Yes       Intervention Provide education and explanation on how to use RPE scale       Expected Outcomes Short Term: Able to use RPE daily in rehab to express subjective intensity level;Long Term:  Able to use RPE to guide intensity level when exercising independently       Able to understand and use Dyspnea scale Yes       Intervention Provide education and explanation on how to use Dyspnea scale       Expected Outcomes Short Term: Able to use Dyspnea scale daily in rehab to express subjective sense of shortness of breath during exertion;Long Term: Able to use Dyspnea scale to guide intensity level when exercising independently       Knowledge and understanding of Target Heart Rate Range (THRR) Yes       Intervention Provide education and explanation of THRR including how the numbers were predicted  and where they are located for reference       Expected Outcomes Short Term: Able to state/look up THRR;Long Term: Able to use THRR to govern intensity when exercising independently;Short Term: Able to use daily as guideline for intensity in rehab       Able to check pulse independently Yes  Intervention Provide education and demonstration on how to check pulse in carotid and radial arteries.;Review the importance of being able to check your own pulse for safety during independent exercise       Expected Outcomes Short Term: Able to explain why pulse checking is important during independent exercise;Long Term: Able to check pulse independently and accurately       Understanding of Exercise Prescription Yes       Intervention Provide education, explanation, and written materials on patient's individual exercise prescription       Expected Outcomes Short Term: Able to explain program exercise prescription;Long Term: Able to explain home exercise prescription to exercise independently                Exercise Goals Re-Evaluation :   Discharge Exercise Prescription (Final Exercise Prescription Changes):  Exercise Prescription Changes - 08/20/21 1600       Response to Exercise   Blood Pressure (Admit) 122/72    Blood Pressure (Exercise) 142/82    Blood Pressure (Exit) 130/82    Heart Rate (Admit) 77 bpm    Heart Rate (Exercise) 122 bpm    Heart Rate (Exit) 72 bpm    Oxygen Saturation (Admit) 97 %    Oxygen Saturation (Exercise) 91 %    Oxygen Saturation (Exit) 95 %    Rating of Perceived Exertion (Exercise) 14    Perceived Dyspnea (Exercise) 3    Symptoms none    Comments 6 min walk results             Nutrition:  Target Goals: Understanding of nutrition guidelines, daily intake of sodium 1500mg , cholesterol 200mg , calories 30% from fat and 7% or less from saturated fats, daily to have 5 or more servings of fruits and vegetables.  Education: All About Nutrition: -Group  instruction provided by verbal, written material, interactive activities, discussions, models, and posters to present general guidelines for heart healthy nutrition including fat, fiber, MyPlate, the role of sodium in heart healthy nutrition, utilization of the nutrition label, and utilization of this knowledge for meal planning. Follow up email sent as well. Written material given at graduation.   Biometrics:  Pre Biometrics - 08/20/21 1632       Pre Biometrics   Height 5' 5.5" (1.664 m)    Weight 219 lb 6.4 oz (99.5 kg)    BMI (Calculated) 35.94    Single Leg Stand 21.22 seconds              Nutrition Therapy Plan and Nutrition Goals:  Nutrition Therapy & Goals - 08/20/21 1635       Intervention Plan   Intervention Prescribe, educate and counsel regarding individualized specific dietary modifications aiming towards targeted core components such as weight, hypertension, lipid management, diabetes, heart failure and other comorbidities.    Expected Outcomes Short Term Goal: Understand basic principles of dietary content, such as calories, fat, sodium, cholesterol and nutrients.;Short Term Goal: A plan has been developed with personal nutrition goals set during dietitian appointment.;Long Term Goal: Adherence to prescribed nutrition plan.             Nutrition Assessments:  MEDIFICTS Score Key: ?70 Need to make dietary changes  40-70 Heart Healthy Diet ? 40 Therapeutic Level Cholesterol Diet  Flowsheet Row Pulmonary Rehab from 08/20/2021 in [facility] Cardiac and Pulmonary Rehab  Picture Your Plate Total Score on Admission 49      Picture Your Plate Scores: [id number] Unhealthy dietary pattern with much room for improvement. 41-50 Dietary  pattern unlikely to meet recommendations for good health and room for improvement. 51-60 More healthful dietary pattern, with some room for improvement.  >60 Healthy dietary pattern, although there may be some specific behaviors that could be  improved.   Nutrition Goals Re-Evaluation:   Nutrition Goals Discharge (Final Nutrition Goals Re-Evaluation):   Psychosocial: Target Goals: Acknowledge presence or absence of significant depression and/or stress, maximize coping skills, provide positive support system. Participant is able to verbalize types and ability to use techniques and skills needed for reducing stress and depression.   Education: Stress, Anxiety, and Depression - Group verbal and visual presentation to define topics covered.  Reviews how body is impacted by stress, anxiety, and depression.  Also discusses healthy ways to reduce stress and to treat/manage anxiety and depression.  Written material given at graduation.   Education: Sleep Hygiene -Provides group verbal and written instruction about how sleep can affect your health.  Define sleep hygiene, discuss sleep cycles and impact of sleep habits. Review good sleep hygiene tips.    Initial Review & Psychosocial Screening:  Initial Psych Review & Screening - 07/11/21 1153       Initial Review   Current issues with Current Sleep Concerns;Current Stress Concerns;History of Depression    Source of Stress Concerns Chronic Illness      Family Dynamics   Good Support System? Yes      Screening Interventions   Interventions Encouraged to exercise;To provide support and resources with identified psychosocial needs;Provide feedback about the scores to participant    Expected Outcomes Long Term Goal: Stressors or current issues are controlled or eliminated.;Short Term goal: Utilizing psychosocial counselor, staff and physician to assist with identification of specific Stressors or current issues interfering with healing process. Setting desired goal for each stressor or current issue identified.;Short Term goal: Identification and review with participant of any Quality of Life or Depression concerns found by scoring the questionnaire.;Long Term goal: The participant  improves quality of Life and PHQ9 Scores as seen by post scores and/or verbalization of changes             Quality of Life Scores:  Scores of 19 and below usually indicate a poorer quality of life in these areas.  A difference of  2-3 points is a clinically meaningful difference.  A difference of 2-3 points in the total score of the Quality of Life Index has been associated with significant improvement in overall quality of life, self-image, physical symptoms, and general health in studies assessing change in quality of life.  PHQ-9: Recent Review Flowsheet Data     Depression screen [facility] 2/9 08/20/2021 11/16/2020 10/16/2020 07/07/2020 10/19/2019   Decreased Interest 1 0 0 1 3   Down, Depressed, Hopeless 1 0 0 0 3   PHQ - 2 Score 2 0 0 1 6   Altered sleeping 3 - - - 3   Tired, decreased energy 1 - - - 3   Change in appetite 2 - - - 3   Feeling bad or failure about yourself  3 - - - 2   Trouble concentrating 3 - - - 2   Moving slowly or fidgety/restless 0 - - - 3   Suicidal thoughts 0 - - - 2   PHQ-9 Score 14 - - - 24   Difficult doing work/chores Extremely dIfficult - - - Very difficult      Interpretation of Total Score  Total Score Depression Severity:  1-4 = Minimal depression, 5-9 = Mild  depression, 10-14 = Moderate depression, 15-19 = Moderately severe depression, 20-27 = Severe depression   Psychosocial Evaluation and Intervention:  Psychosocial Evaluation - 07/11/21 1156       Psychosocial Evaluation & Interventions   Interventions Encouraged to exercise with the program and follow exercise prescription;Relaxation education    Comments Ms. Gilford Rile has multiple health issues including sleep apnea, asthma, diabetes, and heart failure. She states she feels like her symptoms are being addressed. She does continue to have insomnia issues which medication helps some, but most nights are restless. She has a history of PTSD as well. She has had therapy in the past and knows when to  reach out to address worsening issues/symptoms. She is coming to the program with her daughter who also has diastolic heart failure in hopes to get back on track. They both came to the program in 2018 but were unable to complete due to work scheduling.    Expected Outcomes Short: attend pulmonary rehab for education and exercise. Long: develop and maintain positive self care habits.    Continue Psychosocial Services  Follow up required by staff             Psychosocial Re-Evaluation:   Psychosocial Discharge (Final Psychosocial Re-Evaluation):   Education: Education Goals: Education classes will be provided on a weekly basis, covering required topics. Participant will state understanding/return demonstration of topics presented.  Learning Barriers/Preferences:  Learning Barriers/Preferences - 07/11/21 1153       Learning Barriers/Preferences   Learning Barriers None    Learning Preferences None             General Pulmonary Education Topics:  Infection Prevention: - Provides verbal and written material to individual with discussion of infection control including proper hand washing and proper equipment cleaning during exercise session. Flowsheet Row Pulmonary Rehab from 08/20/2021 in [facility] Cardiac and Pulmonary Rehab  Date 08/20/21  Educator [facility]  Instruction Review Code 1- Verbalizes Understanding       Falls Prevention: - Provides verbal and written material to individual with discussion of falls prevention and safety. Flowsheet Row Pulmonary Rehab from 08/20/2021 in [facility] Cardiac and Pulmonary Rehab  Date 08/20/21  Educator [facility]  Instruction Review Code 1- Verbalizes Understanding       Chronic Lung Disease Review: - Group verbal instruction with posters, models, PowerPoint presentations and videos,  to review new updates, new respiratory medications, new advancements in procedures and treatments. Providing information on websites and "800" numbers for continued  self-education. Includes information about supplement oxygen, available portable oxygen systems, continuous and intermittent flow rates, oxygen safety, concentrators, and Medicare reimbursement for oxygen. Explanation of Pulmonary Drugs, including class, frequency, complications, importance of spacers, rinsing mouth after steroid MDI's, and proper cleaning methods for nebulizers. Review of basic lung anatomy and physiology related to function, structure, and complications of lung disease. Review of risk factors. Discussion about methods for diagnosing sleep apnea and types of masks and machines for OSA. Includes a review of the use of types of environmental controls: home humidity, furnaces, filters, dust mite/pet prevention, HEPA vacuums. Discussion about weather changes, air quality and the benefits of nasal washing. Instruction on Warning signs, infection symptoms, calling MD promptly, preventive modes, and value of vaccinations. Review of effective airway clearance, coughing and/or vibration techniques. Emphasizing that all should Create an Action Plan. Written material given at graduation.   AED/CPR: - Group verbal and written instruction with the use of models to demonstrate the basic use of the AED with the  basic ABC's of resuscitation.    Anatomy and Cardiac Procedures: - Group verbal and visual presentation and models provide information about basic cardiac anatomy and function. Reviews the testing methods done to diagnose heart disease and the outcomes of the test results. Describes the treatment choices: Medical Management, Angioplasty, or Coronary Bypass Surgery for treating various heart conditions including Myocardial Infarction, Angina, Valve Disease, and Cardiac Arrhythmias.  Written material given at graduation.   Medication Safety: - Group verbal and visual instruction to review commonly prescribed medications for heart and lung disease. Reviews the medication, class of the drug, and  side effects. Includes the steps to properly store meds and maintain the prescription regimen.  Written material given at graduation.   Other: -Provides group and verbal instruction on various topics (see comments)   Knowledge Questionnaire Score:  Knowledge Questionnaire Score - 08/20/21 1637       Knowledge Questionnaire Score   Pre Score 18/18              Core Components/Risk Factors/Patient Goals at Admission:  Personal Goals and Risk Factors at Admission - 08/20/21 1633       Core Components/Risk Factors/Patient Goals on Admission    Weight Management Yes;Weight Loss    Intervention Weight Management: Provide education and appropriate resources to help participant work on and attain dietary goals.;Weight Management: Develop a combined nutrition and exercise program designed to reach desired caloric intake, while maintaining appropriate intake of nutrient and fiber, sodium and fats, and appropriate energy expenditure required for the weight goal.    Admit Weight 219 lb 6.4 oz (99.5 kg)    Goal Weight: Short Term 200 lb (90.7 kg)    Goal Weight: Long Term 145 lb (65.8 kg)    Expected Outcomes Long Term: Adherence to nutrition and physical activity/exercise program aimed toward attainment of established weight goal;Short Term: Continue to assess and modify interventions until short term weight is achieved;Weight Loss: Understanding of general recommendations for a balanced deficit meal plan, which promotes 1-2 lb weight loss per week and includes a negative energy balance of [phone] kcal/d;Understanding recommendations for meals to include 15-35% energy as protein, 25-35% energy from fat, 35-60% energy from carbohydrates, less than 200mg  of dietary cholesterol, 20-35 gm of total fiber daily;Understanding of distribution of calorie intake throughout the day with the consumption of 4-5 meals/snacks    Improve shortness of breath with ADL's Yes    Intervention Provide education,  individualized exercise plan and daily activity instruction to help decrease symptoms of SOB with activities of daily living.    Expected Outcomes Short Term: Improve cardiorespiratory fitness to achieve a reduction of symptoms when performing ADLs;Long Term: Be able to perform more ADLs without symptoms or delay the onset of symptoms    Increase knowledge of respiratory medications and ability to use respiratory devices properly  Yes    Intervention Provide education and demonstration as needed of appropriate use of medications, inhalers, and oxygen therapy.    Expected Outcomes Short Term: Achieves understanding of medications use. Understands that oxygen is a medication prescribed by physician. Demonstrates appropriate use of inhaler and oxygen therapy.    Diabetes Yes    Intervention Provide education about signs/symptoms and action to take for hypo/hyperglycemia.;Provide education about proper nutrition, including hydration, and aerobic/resistive exercise prescription along with prescribed medications to achieve blood glucose in normal ranges: Fasting glucose 65-99 mg/dL    Expected Outcomes Short Term: Participant verbalizes understanding of the signs/symptoms and immediate care of hyper/hypoglycemia, proper  foot care and importance of medication, aerobic/resistive exercise and nutrition plan for blood glucose control.;Long Term: Attainment of HbA1C < 7%.    Hypertension Yes    Intervention Provide education on lifestyle modifcations including regular physical activity/exercise, weight management, moderate sodium restriction and increased consumption of fresh fruit, vegetables, and low fat dairy, alcohol moderation, and smoking cessation.;Monitor prescription use compliance.    Expected Outcomes Short Term: Continued assessment and intervention until BP is < 140/38mm HG in hypertensive participants. < 130/64mm HG in hypertensive participants with diabetes, heart failure or chronic kidney  disease.;Long Term: Maintenance of blood pressure at goal levels.    Lipids Yes    Intervention Provide education and support for participant on nutrition & aerobic/resistive exercise along with prescribed medications to achieve LDL 70mg , HDL >40mg .    Expected Outcomes Short Term: Participant states understanding of desired cholesterol values and is compliant with medications prescribed. Participant is following exercise prescription and nutrition guidelines.;Long Term: Cholesterol controlled with medications as prescribed, with individualized exercise RX and with personalized nutrition plan. Value goals: LDL < 70mg , HDL > 40 mg.             Education:Diabetes - Individual verbal and written instruction to review signs/symptoms of diabetes, desired ranges of glucose level fasting, after meals and with exercise. Acknowledge that pre and post exercise glucose checks will be done for 3 sessions at entry of program. Flowsheet Row Pulmonary Rehab from 07/11/2021 in [facility] Cardiac and Pulmonary Rehab  Date 07/11/21  Educator [facility]  Instruction Review Code 1- Verbalizes Understanding       Know Your Numbers and Heart Failure: - Group verbal and visual instruction to discuss disease risk factors for cardiac and pulmonary disease and treatment options.  Reviews associated critical values for Overweight/Obesity, Hypertension, Cholesterol, and Diabetes.  Discusses basics of heart failure: signs/symptoms and treatments.  Introduces Heart Failure Zone chart for action plan for heart failure.  Written material given at graduation.   Core Components/Risk Factors/Patient Goals Review:    Core Components/Risk Factors/Patient Goals at Discharge (Final Review):    ITP Comments:  ITP Comments     Row Name 07/11/21 1138 08/29/21 0828 09/10/21 0936 09/25/21 0758 09/26/21 0941   ITP Comments Initial telephone orientation completed. Diagnosis can be found in [facility] 9/22. EP orientation scheduled for Wednesday  11/23 at 9:30am. 30 Day review completed. Medical Director ITP review done, changes made as directed, and signed approval by Medical Director. Ashea has not attended rehab since her day of orientation on 12/19. Staff has attempted to follow up with patient. Will continue to make contact. Pt has not returned since orientation and not returning contact.  We will discharge her today. 30 Day review completed. Medical Director ITP review done, changes made as directed, and signed approval by Medical Director.   Discharged            Comments: Discharge ITP

## 2021-09-27 ENCOUNTER — Other Ambulatory Visit: Payer: Self-pay | Admitting: Gastroenterology

## 2021-09-27 DIAGNOSIS — K7689 Other specified diseases of liver: Secondary | ICD-10-CM

## 2021-10-15 ENCOUNTER — Encounter: Payer: Self-pay | Admitting: Oncology

## 2021-10-17 ENCOUNTER — Telehealth: Payer: Self-pay | Admitting: *Deleted

## 2021-10-17 NOTE — Telephone Encounter (Signed)
Patient needs to reschedule appointment. ?

## 2021-10-18 NOTE — Telephone Encounter (Signed)
Spoke to pt, she has been rescheduled. She is aware of appts .

## 2021-10-22 ENCOUNTER — Other Ambulatory Visit: Payer: Self-pay | Admitting: *Deleted

## 2021-10-22 NOTE — Patient Outreach (Signed)
[facility]) Care Management  10/22/2021  Lenor Sathre Jan 09, 1966 [id number]   RN Health Coach attempted follow up outreach call to patient.  Patient was unavailable. HIPPA compliance voicemail message left with return callback number.  Plan: RN will call patient again within 30 days.  [facility] [phone]

## 2021-10-24 ENCOUNTER — Encounter: Payer: Self-pay | Admitting: *Deleted

## 2021-10-24 NOTE — Telephone Encounter (Signed)
This encounter was created in error - please disregard.

## 2021-11-01 NOTE — Progress Notes (Unsigned)
[facility]  [phone] [phone]  ID: Corlis Leak OB: April 07, 1966  MR#: [id number]  [id number]  Patient Care Team: Donnie Coffin, MD as PCP - General (Family Medicine) Alisa Graff, FNP as Nurse Practitioner (Family Medicine) Dionisio David, MD as Consulting Physician (Cardiology) Cathi Roan, [facility] (Inactive) as Pharmacist (Pharmacist) Pleasant, Eppie Gibson, RN as [facility] Management  CHIEF COMPLAINT: Iron deficiency anemia.  INTERVAL HISTORY: ***  REVIEW OF SYSTEMS:   ROS  As per HPI. Otherwise, a complete review of systems is negative.  PAST MEDICAL HISTORY: Past Medical History:  Diagnosis Date   ADD (attention deficit disorder)    Anxiety    Arthritis    knees, shoulder, upper back   Asthma    CFS (chronic fatigue syndrome)    Chewing difficulty    CHF (congestive heart failure) (HCC)    Chronic kidney disease    Constipation    Depression    Depression    Diabetes mellitus without complication (HCC)    Dyspnea    Environmental allergies    Fatty liver    Fibromyalgia    GERD (gastroesophageal reflux disease)    GI bleed 10/02/2018   Headache    migraines - 5x/mo   Hypertension    Hypokalemia 10/14/2017   Hypothyroidism    IBS (irritable bowel syndrome)    IDA (iron deficiency anemia) 02/23/2021   Joint pain    Lower extremity edema    Major depressive disorder, single episode    Migraine without aura and without status migrainosus, not intractable    Migraines    Motion sickness    ships   Osteoarthritis    Other specified disorders of thyroid    Sleep apnea    Stroke ([place])    no residual deficits   Swallowing difficulty    Thyroid nodule    bilateral and goiter   Vitamin D deficiency    Wears contact lenses    Wears dentures    partial upper    PAST SURGICAL HISTORY: Past Surgical History:  Procedure Laterality Date   ABDOMINAL HYSTERECTOMY  2000's   ABDOMINAL  HYSTERECTOMY     ABDOMINAL SURGERY     laparoscopy x4 with lysis of adhesions   [place] N/A 04/03/2017   Procedure: LAPAROSCOPIC CHOLECYSTECTOMY;  Surgeon: Olean Ree, MD;  Location: [facility] ORS;  Service: General;  Laterality: N/A;   COLONOSCOPY N/A 10/06/2018   Procedure: COLONOSCOPY;  Surgeon: Lin Landsman, MD;  Location: [facility] ENDOSCOPY;  Service: Gastroenterology;  Laterality: N/A;   COLONOSCOPY WITH PROPOFOL N/A 03/10/2017   Procedure: COLONOSCOPY WITH PROPOFOL;  Surgeon: Manya Silvas, MD;  Location: [facility] ENDOSCOPY;  Service: Endoscopy;  Laterality: N/A;   ECTOPIC PREGNANCY SURGERY  2000's   ESOPHAGOGASTRODUODENOSCOPY (EGD) WITH PROPOFOL N/A 03/10/2017   Procedure: ESOPHAGOGASTRODUODENOSCOPY (EGD) WITH PROPOFOL;  Surgeon: Manya Silvas, MD;  Location: [facility] ENDOSCOPY;  Service: Endoscopy;  Laterality: N/A;   HERNIA REPAIR     JOINT REPLACEMENT Right 12/16/12   knee- medial - makoplasty   KNEE ARTHROSCOPY Right 2006   partial medial and lateral meniscectomies   KNEE ARTHROSCOPY Right 10/06/2015   Procedure: RIGHT KNEE ARTHROSCOPY WITH DEBRIDEMENT;  Surgeon: Leanor Kail, MD;  Location: [place];  Service: Orthopedics;  Laterality: Right;  Diabetic - insulin and oral meds   ROTATOR CUFF REPAIR Left 2006   TUBAL LIGATION  2000's  FAMILY HISTORY: Family History  Problem Relation Age of Onset   Hypertension Mother    Diabetes Mother    Heart disease Mother    Depression Mother    Obesity Mother    Hypertension Father    Diabetes Father    Allergies Father    Kidney disease Father    Cancer Father    Breast cancer Neg Hx     ADVANCED DIRECTIVES (Y/N):  N  HEALTH MAINTENANCE: Social History   Tobacco Use   Smoking status: Never   Smokeless tobacco: Never  Vaping Use   Vaping Use: Never used  Substance Use Topics   Alcohol use: Not Currently   Drug use: Never     Colonoscopy:  PAP:  Bone  density:  Lipid panel:  Allergies  Allergen Reactions   Penicillins Anaphylaxis    Has patient had a PCN reaction causing immediate rash, facial/tongue/throat swelling, SOB or lightheadedness with hypotension: Yes Has patient had a PCN reaction causing severe rash involving mucus membranes or skin necrosis: No Has patient had a PCN reaction that required hospitalization No Has patient had a PCN reaction occurring within the last 10 years: No If all of the above answers are "NO", then may proceed with Cephalosporin use.  Has patient had a PCN reaction causing immediate rash, facial/tongue/throat swelling, SOB or lightheadedness with hypotension: Yes Has patient had a PCN reaction causing severe rash involving mucus membranes or skin necrosis: No Has patient had a PCN reaction that required hospitalization No Has patient had a PCN reaction occurring within the last 10 years: No If all of the above answers are "NO", then may proceed with Cephalosporin use. Other reaction(s): Other (See Comments) Severe/almost died Other reaction(s): ANAPHYLAXIS Has patient had a PCN reaction causing immediate rash, facial/tongue/throat swelling, SOB or lightheadedness with hypotension: Yes Has patient had a PCN reaction causing severe rash involving mucus membranes or skin necrosis: No Has patient had a PCN reaction that required hospitalization No Has patient had a PCN reaction occurring within the last 10 years: No If all of the above answers are "NO", then may proceed with Cephalosporin use. Other reaction(s): Other (See Comments) Severe/almost died Other reaction(s): ANAPHYLAXIS Has patient had a PCN reaction causing immediate rash, facial/tongue/throat swelling, SOB or lightheadedness with hypotension: Yes Has patient had a PCN reaction causing severe rash involving mucus membranes or skin necrosis: No Has patient had a PCN reaction that required hospitalization No Has patient had a PCN reaction  occurring within the last 10 years: No If all of the above answers are "NO", then may proceed with Cephalospo... (TRUNCATED)   Penicillins Anaphylaxis   Ace Inhibitors Swelling   Ace Inhibitors Swelling    Current Outpatient Medications  Medication Sig Dispense Refill   ACCU-CHEK GUIDE test strip      albuterol (VENTOLIN HFA) 108 (90 Base) MCG/ACT inhaler Inhale 2 puffs into the lungs every 6 (six) hours as needed for wheezing or shortness of breath. 8 g 6   Ascorbic Acid (VITAMIN C CR) 1000 MG TBCR Take 1 tablet by mouth daily.     B-D UF III MINI PEN NEEDLES 31G X 5 MM MISC Inject into the skin daily.     BD PEN NEEDLE MICRO U/F 32G X 6 MM MISC Inject into the skin.     buprenorphine (BUTRANS) 10 MCG/HR PTWK      cholecalciferol (VITAMIN D) 25 MCG (1000 UNIT) tablet Take 1,000 Units by mouth daily.     desvenlafaxine (  PRISTIQ) 50 MG 24 hr tablet Take 50 mg by mouth daily.     Dexmethylphenidate HCl 25 MG CP24 Take 1 capsule by mouth every morning.     estradiol (ESTRACE) 0.1 MG/GM vaginal cream      ferrous sulfate 324 MG TBEC Take 324 mg by mouth daily. (Patient not taking: Reported on 06/14/2021)     flunisolide (NASALIDE) 25 MCG/ACT (0.025%) SOLN Place into the nose. (Patient not taking: Reported on 06/14/2021)     furosemide (LASIX) 40 MG tablet Take 1 tablet (40 mg total) by mouth 2 (two) times daily. 180 tablet 3   HUMALOG KWIKPEN 100 UNIT/ML KwikPen Inject into the skin.     hydrocortisone 2.5 % ointment Apply topically 2 (two) times daily.     hydrOXYzine (ATARAX/VISTARIL) 25 MG tablet      INPEN 100-GRAY-LILLY DEVI as directed.     Insulin Pen Needle 31G X 6 MM MISC SMARTSIG:1 Needle SUB-Q 4 Times Daily     ipratropium-albuterol (DUONEB) 0.5-2.5 (3) MG/3ML SOLN Inhale into the lungs.     JARDIANCE 25 MG TABS tablet Take 25 mg by mouth daily.     LATUDA 40 MG TABS tablet Take 40 mg by mouth at bedtime. (Patient not taking: Reported on 06/14/2021)     lidocaine (LIDODERM) 5 %  lidocaine 5 % topical patch  PLEASE SEE ATTACHED FOR DETAILED DIRECTIONS (Patient not taking: Reported on 06/14/2021)     LINZESS 290 MCG CAPS capsule Take 290 mcg by mouth daily.     LORazepam (ATIVAN) 1 MG tablet Take by mouth.     losartan (COZAAR) 25 MG tablet Take 25 mg by mouth daily.     mirtazapine (REMERON SOL-TAB) 30 MG disintegrating tablet Take 30 mg by mouth at bedtime as needed (Nap).   2   montelukast (SINGULAIR) 10 MG tablet Take 10 mg by mouth daily.     nabumetone (RELAFEN) 500 MG tablet Take by mouth.     NOVOLOG FLEXPEN 100 UNIT/ML FlexPen Inject 10 Units into the skin daily. (Patient not taking: Reported on 06/14/2021)     omeprazole (PRILOSEC) 40 MG capsule Take 40 mg by mouth daily.     potassium chloride SA (KLOR-CON) 20 MEQ tablet Take 1 tablet (20 mEq total) by mouth daily. 90 tablet 3   pravastatin (PRAVACHOL) 40 MG tablet Take 40 mg by mouth daily.     pregabalin (LYRICA) 150 MG capsule Take 150 mg by mouth 3 (three) times daily.     PROAIR RESPICLICK 123XX123 (90 Base) MCG/ACT AEPB Inhale 2 puffs into the lungs every 4 (four) hours as needed.     propranolol ER (INDERAL LA) 80 MG 24 hr capsule Take 1 capsule (80 mg total) by mouth daily. 90 capsule 3   Semaglutide, 1 MG/DOSE, (OZEMPIC, 1 MG/DOSE,) 2 MG/1.5ML SOPN Inject into the skin.     SUMAtriptan (IMITREX) 50 MG tablet Take 1 tablet (50 mg total) by mouth every 2 (two) hours as needed for migraine. May repeat in 2 hours if headache persists or recurs.  Not to exceed 4 pills in a 24-hour period.  If you find you need more medication and you have taken 4 pills, please go directly to the emergency room or call 911. (Patient not taking: Reported on 06/14/2021) 10 tablet 0   tiotropium (SPIRIVA HANDIHALER) 18 MCG inhalation capsule Place 1 capsule (18 mcg total) into inhaler and inhale daily. 30 capsule 6   tiZANidine (ZANAFLEX) 4 MG tablet Take 4 mg  by mouth every 8 (eight) hours as needed for muscle spasms.      topiramate  (TOPAMAX) 100 MG tablet Take 100 mg by mouth 2 (two) times daily.     TRESIBA FLEXTOUCH 200 UNIT/ML FlexTouch Pen 72 units per day seems very high for you.  Please follow up with your outpatient provider for further adjustment.     triamcinolone ointment (KENALOG) 0.1 % Apply topically.     TRUEPLUS PEN NEEDLES 31G X 6 MM MISC      VRAYLAR 6 MG CAPS Take 1 capsule by mouth daily.     zolpidem (AMBIEN) 10 MG tablet Take 10 mg by mouth at bedtime.     No current facility-administered medications for this visit.    OBJECTIVE: There were no vitals filed for this visit.   There is no height or weight on file to calculate BMI.    ECOG FS:{CHL ONC [id number]  General: Well-developed, well-nourished, no acute distress. Eyes: Pink conjunctiva, anicteric sclera. HEENT: Normocephalic, moist mucous membranes. Lungs: No audible wheezing or coughing. Heart: Regular rate and rhythm. Abdomen: Soft, nontender, no obvious distention. Musculoskeletal: No edema, cyanosis, or clubbing. Neuro: Alert, answering all questions appropriately. Cranial nerves grossly intact. Skin: No rashes or petechiae noted. Psych: Normal affect. Lymphatics: No cervical, calvicular, axillary or inguinal LAD.   LAB RESULTS:  Lab Results  Component Value Date   NA 139 05/24/2021   K 4.0 05/24/2021   CL 106 05/24/2021   CO2 28 05/24/2021   GLUCOSE 91 05/24/2021   BUN 16 05/24/2021   CREATININE 0.91 05/24/2021   CALCIUM 9.5 05/24/2021   PROT 7.8 02/22/2021   ALBUMIN 4.1 02/22/2021   AST 22 02/22/2021   ALT 30 02/22/2021   ALKPHOS 126 02/22/2021   BILITOT 0.4 02/22/2021   GFRNONAA >60 05/24/2021   GFRAA >60 05/09/2020    Lab Results  Component Value Date   WBC 6.0 06/14/2021   NEUTROABS 3.7 06/14/2021   HGB 14.2 06/14/2021   HCT 43.5 06/14/2021   MCV 89.7 06/14/2021   PLT 408 (H) 06/14/2021     STUDIES: No results found.  ASSESSMENT: Iron deficiency anemia.   PLAN:    Iron deficiency  anemia:  Patient expressed understanding and was in agreement with this plan. She also understands that She can call clinic at any time with any questions, concerns, or complaints.    Cancer Staging  No matching staging information was found for the patient.  Lloyd Huger, MD   11/01/2021 11:44 AM

## 2021-11-08 ENCOUNTER — Inpatient Hospital Stay: Payer: Medicare Other | Admitting: Oncology

## 2021-11-08 ENCOUNTER — Inpatient Hospital Stay: Payer: Medicare Other

## 2021-11-08 ENCOUNTER — Inpatient Hospital Stay: Payer: Medicare Other | Attending: Oncology

## 2021-11-08 DIAGNOSIS — D509 Iron deficiency anemia, unspecified: Secondary | ICD-10-CM

## 2021-11-22 ENCOUNTER — Ambulatory Visit: Payer: Self-pay | Admitting: *Deleted

## 2021-11-26 ENCOUNTER — Telehealth: Payer: Self-pay | Admitting: *Deleted

## 2021-11-26 NOTE — Telephone Encounter (Signed)
Patient called and left a vm requesting to r/s her apts. I returned her phone call. She would like to r/s the no show apt on 3/9 with Dr. Grayland Ormond- lab and venofer.  I explained to patient that I would have the scheduling team review the scheduling and contact her with a new apt. ?

## 2021-12-07 ENCOUNTER — Encounter: Payer: Self-pay | Admitting: Medical Oncology

## 2021-12-07 ENCOUNTER — Inpatient Hospital Stay: Payer: Medicare Other

## 2021-12-07 ENCOUNTER — Inpatient Hospital Stay: Payer: Medicare Other | Attending: Oncology | Admitting: Medical Oncology

## 2021-12-28 ENCOUNTER — Other Ambulatory Visit: Payer: Self-pay | Admitting: *Deleted

## 2021-12-28 NOTE — Patient Outreach (Signed)
.  [facility]) Care Management ? ?12/28/2021 ? ?Levander Campion ?03-15-1966 ?[id number] ? ? ?RN Health Coach attempted follow up outreach call to patient.  Patient was unavailable. Mailbox full and unable to leave a message. ? ? ?Plan: ?RN will call patient again within 30 days. ? ?Gean Maidens BSN RN ?[facility] Care Management ?[phone] ? ?

## 2022-01-16 ENCOUNTER — Ambulatory Visit: Payer: Medicare Other | Admitting: Dermatology

## 2022-01-17 NOTE — Progress Notes (Signed)
Patient ID: Amanda [name], female    DOB: 26-Apr-1966, 56 y.o.   MRN: [id number]  HPI  [name] is a 55 y/o female with a history of asthma, DM, HTN, stroke, thyroid disease, GERD, fibromyalgia, migraines, obstructive sleep apnea and chronic heart failure.   Echo report from 05/09/20 reviewed and showed an EF of 50-55% without LVH. Had echo done 10/04/2018 but unable to view those results. Echo report from 03/29/18 reviewed and showed an EF of 55-60%.  Has not been admitted or been in the ED in the last 6 months.   She presents today for a follow-up visit with a chief complaint of moderate fatigue with minimal exertion. Describes this as chronic in nature having been present for several years. She has associated shortness of breath, chest pain, pedal edema, palpitations, abdominal distention, light-headedness, difficulty sleeping and slight weight gain along with this. She denies any cough or wheezing  Says that she's been out of her furosemide for ~ the last month and needs a new RX.   Planning to call [facility] cardiology to get established.   She is drinking 3L of fluids daily because when she drinks the 2L / day, she ends up hypotensive and then in the ED or admitted.   Past Medical History:  Diagnosis Date   ADD (attention deficit disorder)    Anxiety    Arthritis    knees, shoulder, upper back   Asthma    CFS (chronic fatigue syndrome)    Chewing difficulty    CHF (congestive heart failure) (HCC)    Chronic kidney disease    Constipation    Depression    Depression    Diabetes mellitus without complication (HCC)    Dyspnea    Environmental allergies    Fatty liver    Fibromyalgia    GERD (gastroesophageal reflux disease)    GI bleed 10/02/2018   Headache    migraines - 5x/mo   Hypertension    Hypokalemia 10/14/2017   Hypothyroidism    IBS (irritable bowel syndrome)    IDA (iron deficiency anemia) 02/23/2021   Joint pain    Lower extremity edema    Major depressive disorder,  single episode    Migraine without aura and without status migrainosus, not intractable    Migraines    Motion sickness    ships   Osteoarthritis    Other specified disorders of thyroid    Sleep apnea    Stroke ([place])    no residual deficits   Swallowing difficulty    Thyroid nodule    bilateral and goiter   Vitamin D deficiency    Wears contact lenses    Wears dentures    partial upper   Past Surgical History:  Procedure Laterality Date   ABDOMINAL HYSTERECTOMY  2000's   ABDOMINAL HYSTERECTOMY     ABDOMINAL SURGERY     laparoscopy x4 with lysis of adhesions   [place] N/A 04/03/2017   Procedure: LAPAROSCOPIC CHOLECYSTECTOMY;  Surgeon: Olean Ree, MD;  Location: [facility] ORS;  Service: General;  Laterality: N/A;   COLONOSCOPY N/A 10/06/2018   Procedure: COLONOSCOPY;  Surgeon: Lin Landsman, MD;  Location: [facility] ENDOSCOPY;  Service: Gastroenterology;  Laterality: N/A;   COLONOSCOPY WITH PROPOFOL N/A 03/10/2017   Procedure: COLONOSCOPY WITH PROPOFOL;  Surgeon: Manya Silvas, MD;  Location: [facility] ENDOSCOPY;  Service: Endoscopy;  Laterality: N/A;   ECTOPIC PREGNANCY SURGERY  2000's  ESOPHAGOGASTRODUODENOSCOPY (EGD) WITH PROPOFOL N/A 03/10/2017   Procedure: ESOPHAGOGASTRODUODENOSCOPY (EGD) WITH PROPOFOL;  Surgeon: Manya Silvas, MD;  Location: [facility] ENDOSCOPY;  Service: Endoscopy;  Laterality: N/A;   HERNIA REPAIR     JOINT REPLACEMENT Right 12/16/12   knee- medial - makoplasty   KNEE ARTHROSCOPY Right 2006   partial medial and lateral meniscectomies   KNEE ARTHROSCOPY Right 10/06/2015   Procedure: RIGHT KNEE ARTHROSCOPY WITH DEBRIDEMENT;  Surgeon: Leanor Kail, MD;  Location: [place];  Service: Orthopedics;  Laterality: Right;  Diabetic - insulin and oral meds   ROTATOR CUFF REPAIR Left 2006   TUBAL LIGATION  2000's   Family History  Problem Relation Age of Onset   Hypertension Mother    Diabetes Mother     Heart disease Mother    Depression Mother    Obesity Mother    Hypertension Father    Diabetes Father    Allergies Father    Kidney disease Father    Cancer Father    Breast cancer Neg Hx    Social History   Tobacco Use   Smoking status: Never   Smokeless tobacco: Never  Substance Use Topics   Alcohol use: Not Currently   Allergies  Allergen Reactions   Penicillins Anaphylaxis    Has patient had a PCN reaction causing immediate rash, facial/tongue/throat swelling, SOB or lightheadedness with hypotension: Yes Has patient had a PCN reaction causing severe rash involving mucus membranes or skin necrosis: No Has patient had a PCN reaction that required hospitalization No Has patient had a PCN reaction occurring within the last 10 years: No If all of the above answers are "NO", then may proceed with Cephalosporin use.  Has patient had a PCN reaction causing immediate rash, facial/tongue/throat swelling, SOB or lightheadedness with hypotension: Yes Has patient had a PCN reaction causing severe rash involving mucus membranes or skin necrosis: No Has patient had a PCN reaction that required hospitalization No Has patient had a PCN reaction occurring within the last 10 years: No If all of the above answers are "NO", then may proceed with Cephalosporin use. Other reaction(s): Other (See Comments) Severe/almost died Other reaction(s): ANAPHYLAXIS Has patient had a PCN reaction causing immediate rash, facial/tongue/throat swelling, SOB or lightheadedness with hypotension: Yes Has patient had a PCN reaction causing severe rash involving mucus membranes or skin necrosis: No Has patient had a PCN reaction that required hospitalization No Has patient had a PCN reaction occurring within the last 10 years: No If all of the above answers are "NO", then may proceed with Cephalosporin use. Other reaction(s): Other (See Comments) Severe/almost died Other reaction(s): ANAPHYLAXIS Has patient had  a PCN reaction causing immediate rash, facial/tongue/throat swelling, SOB or lightheadedness with hypotension: Yes Has patient had a PCN reaction causing severe rash involving mucus membranes or skin necrosis: No Has patient had a PCN reaction that required hospitalization No Has patient had a PCN reaction occurring within the last 10 years: No If all of the above answers are "NO", then may proceed with Cephalospo... (TRUNCATED)   Penicillins Anaphylaxis   Ace Inhibitors Swelling   Ace Inhibitors Swelling   Prior to Admission medications   Medication Sig Start Date End Date Taking? Authorizing Provider  ACCU-CHEK GUIDE test strip  12/06/20  Yes [provider]  albuterol (VENTOLIN HFA) 108 (90 Base) MCG/ACT inhaler Inhale 2 puffs into the lungs every 6 (six) hours as needed for wheezing or shortness of breath. 02/20/21  Yes Flora Lipps, MD  Ascorbic Acid (VITAMIN C CR) 1000 MG TBCR Take 1 tablet by mouth daily.   Yes [provider]  B-D UF III MINI PEN NEEDLES 31G X 5 MM MISC Inject into the skin daily. 03/18/20  Yes [provider]  BD PEN NEEDLE MICRO U/F 32G X 6 MM MISC Inject into the skin. 06/14/20  Yes [provider]  buprenorphine Haze Rushing) 10 MCG/HR [place]  03/29/21  Yes [provider]  cholecalciferol (VITAMIN D) 25 MCG (1000 UNIT) tablet Take 1,000 Units by mouth daily. 05/01/20  Yes [provider]  desvenlafaxine (PRISTIQ) 50 MG 24 hr tablet Take 50 mg by mouth daily. 01/28/21  Yes [provider]  Dexmethylphenidate HCl 25 MG CP24 Take 1 capsule by mouth every morning. 05/09/21  Yes [provider]  estradiol (ESTRACE) 0.1 MG/GM vaginal cream  10/15/19  Yes [provider]  HUMALOG KWIKPEN 100 UNIT/ML KwikPen Inject into the skin. 04/24/21  Yes [provider]  hydrocortisone 2.5 % ointment Apply topically 2 (two) times daily. 01/23/21  Yes [provider]  hydrOXYzine (ATARAX/VISTARIL) 25 MG  tablet  04/09/21  Yes [provider]  Insulin Pen Needle 31G X 6 MM MISC SMARTSIG:1 Needle SUB-Q 4 Times Daily 05/19/20  Yes [provider]  ipratropium-albuterol (DUONEB) 0.5-2.5 (3) MG/3ML SOLN Inhale into the lungs.   Yes [provider]  JARDIANCE 25 MG TABS tablet Take 25 mg by mouth daily. 03/18/20  Yes [provider]  LINZESS 290 MCG CAPS capsule Take 290 mcg by mouth daily. 04/12/20  Yes [provider]  LORazepam (ATIVAN) 1 MG tablet Take by mouth. 09/20/20  Yes [provider]  mirtazapine (REMERON SOL-TAB) 30 MG disintegrating tablet Take 30 mg by mouth at bedtime as needed (Nap).  03/18/18  Yes [provider]  montelukast (SINGULAIR) 10 MG tablet Take 10 mg by mouth daily. 04/12/20  Yes [provider]  nabumetone (RELAFEN) 500 MG tablet Take by mouth. 12/29/20  Yes [provider]  omeprazole (PRILOSEC) 40 MG capsule Take 40 mg by mouth daily. 04/12/20  Yes [provider]  potassium chloride SA (KLOR-CON) 20 MEQ tablet Take 1 tablet (20 mEq total) by mouth daily. 05/25/21  Yes Jefferie Holston, Otila Kluver A, FNP  pravastatin (PRAVACHOL) 40 MG tablet Take 40 mg by mouth daily. 04/12/20  Yes [provider]  pregabalin (LYRICA) 150 MG capsule Take 150 mg by mouth 3 (three) times daily. 04/12/20  Yes [provider]  PROAIR RESPICLICK 123XX123 (90 Base) MCG/ACT AEPB Inhale 2 puffs into the lungs every 4 (four) hours as needed. 11/12/19  Yes [provider]  propranolol ER (INDERAL LA) 80 MG 24 hr capsule Take 1 capsule (80 mg total) by mouth daily. 05/25/21  Yes Teon Hudnall A, FNP  Semaglutide, 1 MG/DOSE, (OZEMPIC, 1 MG/DOSE,) 2 MG/1.5ML SOPN Inject into the skin.   Yes [provider]  SUMAtriptan (IMITREX) 50 MG tablet Take 1 tablet (50 mg total) by mouth every 2 (two) hours as needed for migraine. May repeat in 2 hours if headache persists or recurs.  Not to exceed 4 pills in a 24-hour  period.  If you find you need more medication and you have taken 4 pills, please go directly to the emergency room or call 911. 11/13/20  Yes Verda Cumins, MD  tiotropium (SPIRIVA HANDIHALER) 18 MCG inhalation capsule Place 1 capsule (18 mcg total) into inhaler and inhale daily. 02/20/21 02/20/22 Yes Flora Lipps, MD  tiZANidine (ZANAFLEX) 4  MG tablet Take 4 mg by mouth every 8 (eight) hours as needed for muscle spasms.    Yes [provider]  topiramate (TOPAMAX) 100 MG tablet Take 100 mg by mouth 2 (two) times daily. 04/12/20  Yes [provider]  triamcinolone ointment (KENALOG) 0.1 % Apply topically. 01/23/21 01/23/22 Yes [provider]  TRUEPLUS PEN NEEDLES 31G X 6 MM [place]  05/22/20  Yes [provider]  VRAYLAR 6 MG CAPS Take 1 capsule by mouth daily. 05/09/21  Yes [provider]  zolpidem (AMBIEN) 10 MG tablet Take 10 mg by mouth at bedtime. 06/23/20  Yes [provider]  flunisolide (NASALIDE) 25 MCG/ACT (0.025%) SOLN Place into the nose. Patient not taking: Reported on 06/14/2021    [provider]  furosemide (LASIX) 40 MG tablet Take 1 tablet (40 mg total) by mouth 2 (two) times daily. 01/18/22   Alisa Graff, FNP  INPEN 100-GRAY-LILLY DEVI as directed. Patient not taking: Reported on 01/18/2022 06/13/20   [provider]  NOVOLOG FLEXPEN 100 UNIT/ML FlexPen Inject 10 Units into the skin daily. Patient not taking: Reported on 01/18/2022 05/22/20   [provider]  TRESIBA FLEXTOUCH 200 UNIT/ML FlexTouch Pen 72 units per day seems very high for you.  Please follow up with your outpatient provider for further adjustment. 07/04/20   Enzo Bi, MD   Review of Systems  Constitutional:  Positive for fatigue (with minimal exertion). Negative for appetite change.  HENT:  Negative for congestion and postnasal drip.   Eyes: Negative.   Respiratory:  Positive for shortness of breath (with moderate exertion). Negative for  cough, chest tightness and wheezing.   Cardiovascular:  Positive for chest pain ("little bit"), palpitations and leg swelling (both feet).  Gastrointestinal:  Positive for abdominal distention ("little bloated"). Negative for abdominal pain.  Endocrine: Negative.   Genitourinary: Negative.   Musculoskeletal:  Positive for arthralgias (right knee). Negative for back pain.  Skin: Negative.   Allergic/Immunologic: Negative.   Neurological:  Positive for light-headedness (sometimes). Negative for dizziness.  Hematological:  Negative for adenopathy. Does not bruise/bleed easily.  Psychiatric/Behavioral:  Positive for sleep disturbance (sleeping on 2 pillows). Negative for dysphoric mood. The patient is not nervous/anxious.    Vitals:   01/18/22 1049  BP: 131/80  Pulse: 70  Resp: 18  SpO2: 100%  Weight: 215 lb (97.5 kg)  Height: 5\' 5"  (1.651 m)   Wt Readings from Last 3 Encounters:  01/18/22 215 lb (97.5 kg)  08/20/21 219 lb 6.4 oz (99.5 kg)  06/14/21 217 lb 4.8 oz (98.6 kg)   Lab Results  Component Value Date   CREATININE 0.91 05/24/2021   CREATININE 0.98 02/22/2021   CREATININE 0.86 09/24/2020   Physical Exam Vitals and nursing note reviewed.  Constitutional:      Appearance: She is well-developed.  HENT:     Head: Normocephalic and atraumatic.  Neck:     Thyroid: No thyromegaly.  Cardiovascular:     Rate and Rhythm: Normal rate and regular rhythm.  Pulmonary:     Effort: Pulmonary effort is normal. No respiratory distress.     Breath sounds: No wheezing or rales.  Abdominal:     General: There is no distension.     Palpations: Abdomen is soft.  Musculoskeletal:     Cervical back: Normal range of motion and neck supple.     Right lower leg: No tenderness. Edema (2+ pitting) present.     Left lower leg: No tenderness.  Edema (2+ pitting) present.  Skin:    General: Skin is warm and dry.  Neurological:     Mental Status: She is alert and oriented to person, place, and  time.  Psychiatric:        Behavior: Behavior normal.   Assessment & Plan:  1: Chronic heart failure with preserved ejection fraction without structural changes- - NYHA class III - euvolemic today - weighing daily and she was reminded to call for an overnight weight gain of >2 pounds or a weekly weight gain of >5 pounds - weight stable from last visit here 8 months ago - followed with cardiology Humphrey Rolls) in the past but has been released from his practice due to NS; planning on getting established with cardiology at [facility]; encouraged her to call and get an appointment scheduled - not adding salt and trying to follow a low sodium diet - drinking ~ 3L of water daily as she ends up hypotensive if she only drinks 2L  - sent furosemide RX to pharmacy - BNP 09/12/20 was 64.0   2: HTN- - BP looks good (131/80) - saw PCP Clide Deutscher at [facility]) 01/15/22 - BMP 01/15/22 reviewed and showed sodium 140, potassium 4.1, creatinine 0.66 and GFR >90  3: DM- - A1c 01/15/22 was 7.6% - glucose at home today was 161 - saw endocrinology ([place]) 01/15/22 - saw nephrology Percell Miller) 02/12/21  4: Asthma- - saw pulmonology Humphrey Rolls) 05/08/21  5: Lymphedema- - stage 2 - limited in her ability to exercise due to her symptoms - has worn compression socks in the past but not recently; instructed to resume wearing them daily with removal at bedtime - elevate legs when sitting for long periods of time - consider compression boots if edema persists   Patient did not bring her medications nor a list. Each medication was verbally reviewed with the patient and she was encouraged to bring the bottles to every visit to confirm accuracy of list.  Return in 6 weeks, sooner if needed.

## 2022-01-18 ENCOUNTER — Ambulatory Visit: Payer: Medicare Other | Attending: Family | Admitting: Family

## 2022-01-18 ENCOUNTER — Encounter: Payer: Self-pay | Admitting: Family

## 2022-01-18 VITALS — BP 131/80 | HR 70 | Resp 18 | Ht 65.0 in | Wt 215.0 lb

## 2022-01-18 DIAGNOSIS — I130 Hypertensive heart and chronic kidney disease with heart failure and stage 1 through stage 4 chronic kidney disease, or unspecified chronic kidney disease: Secondary | ICD-10-CM | POA: Diagnosis not present

## 2022-01-18 DIAGNOSIS — Z794 Long term (current) use of insulin: Secondary | ICD-10-CM | POA: Insufficient documentation

## 2022-01-18 DIAGNOSIS — I10 Essential (primary) hypertension: Secondary | ICD-10-CM | POA: Diagnosis not present

## 2022-01-18 DIAGNOSIS — N189 Chronic kidney disease, unspecified: Secondary | ICD-10-CM | POA: Insufficient documentation

## 2022-01-18 DIAGNOSIS — Z8673 Personal history of transient ischemic attack (TIA), and cerebral infarction without residual deficits: Secondary | ICD-10-CM | POA: Diagnosis not present

## 2022-01-18 DIAGNOSIS — E119 Type 2 diabetes mellitus without complications: Secondary | ICD-10-CM | POA: Diagnosis not present

## 2022-01-18 DIAGNOSIS — J4540 Moderate persistent asthma, uncomplicated: Secondary | ICD-10-CM

## 2022-01-18 DIAGNOSIS — J45909 Unspecified asthma, uncomplicated: Secondary | ICD-10-CM | POA: Diagnosis not present

## 2022-01-18 DIAGNOSIS — Z79899 Other long term (current) drug therapy: Secondary | ICD-10-CM | POA: Diagnosis not present

## 2022-01-18 DIAGNOSIS — E1122 Type 2 diabetes mellitus with diabetic chronic kidney disease: Secondary | ICD-10-CM | POA: Diagnosis not present

## 2022-01-18 DIAGNOSIS — K219 Gastro-esophageal reflux disease without esophagitis: Secondary | ICD-10-CM | POA: Insufficient documentation

## 2022-01-18 DIAGNOSIS — I890 Lymphedema, not elsewhere classified: Secondary | ICD-10-CM | POA: Diagnosis not present

## 2022-01-18 DIAGNOSIS — I5032 Chronic diastolic (congestive) heart failure: Secondary | ICD-10-CM | POA: Insufficient documentation

## 2022-01-18 MED ORDER — FUROSEMIDE 40 MG PO TABS: 40.0000 mg | ORAL_TABLET | Freq: Two times a day (BID) | ORAL | 3 refills | Status: DC

## 2022-01-18 NOTE — Patient Instructions (Addendum)
Continue weighing daily and call for an overnight weight gain of 3 pounds or more or a weekly weight gain of more than 5 pounds.   If you have voicemail, please make sure your mailbox is cleaned out so that we may leave a message and please make sure to listen to any voicemails.    Pick up your fluid pills from [facility] in [place],   Call the cardiologist at [facility] and get an appointment scheduled.

## 2022-01-24 ENCOUNTER — Other Ambulatory Visit: Payer: Self-pay | Admitting: *Deleted

## 2022-01-24 NOTE — Patient Outreach (Signed)
[facility]) Care Management  01/24/2022  Amanda [name] 11-Sep-1965 [id number]   RN Health Coach attempted follow up outreach call to patient.  Patient was unavailable.Mailbox is full. Unable to leave a message with call back number.   Plan: RN will call patient again within 30 days.  [place] Care Management [phone]

## 2022-02-05 ENCOUNTER — Inpatient Hospital Stay (HOSPITAL_BASED_OUTPATIENT_CLINIC_OR_DEPARTMENT_OTHER): Payer: Medicare Other | Admitting: Medical Oncology

## 2022-02-05 ENCOUNTER — Encounter: Payer: Self-pay | Admitting: Medical Oncology

## 2022-02-05 ENCOUNTER — Inpatient Hospital Stay: Payer: Medicare Other | Attending: Oncology

## 2022-02-05 VITALS — BP 143/94 | HR 71 | Temp 98.4°F | Resp 16 | Ht 65.0 in | Wt 214.3 lb

## 2022-02-05 DIAGNOSIS — G40909 Epilepsy, unspecified, not intractable, without status epilepticus: Secondary | ICD-10-CM | POA: Diagnosis not present

## 2022-02-05 DIAGNOSIS — Z8673 Personal history of transient ischemic attack (TIA), and cerebral infarction without residual deficits: Secondary | ICD-10-CM | POA: Insufficient documentation

## 2022-02-05 DIAGNOSIS — Z8719 Personal history of other diseases of the digestive system: Secondary | ICD-10-CM | POA: Diagnosis not present

## 2022-02-05 DIAGNOSIS — J449 Chronic obstructive pulmonary disease, unspecified: Secondary | ICD-10-CM | POA: Insufficient documentation

## 2022-02-05 DIAGNOSIS — I509 Heart failure, unspecified: Secondary | ICD-10-CM | POA: Insufficient documentation

## 2022-02-05 DIAGNOSIS — M1990 Unspecified osteoarthritis, unspecified site: Secondary | ICD-10-CM | POA: Diagnosis not present

## 2022-02-05 DIAGNOSIS — E559 Vitamin D deficiency, unspecified: Secondary | ICD-10-CM | POA: Diagnosis not present

## 2022-02-05 DIAGNOSIS — E1122 Type 2 diabetes mellitus with diabetic chronic kidney disease: Secondary | ICD-10-CM | POA: Insufficient documentation

## 2022-02-05 DIAGNOSIS — I130 Hypertensive heart and chronic kidney disease with heart failure and stage 1 through stage 4 chronic kidney disease, or unspecified chronic kidney disease: Secondary | ICD-10-CM | POA: Diagnosis not present

## 2022-02-05 DIAGNOSIS — K589 Irritable bowel syndrome without diarrhea: Secondary | ICD-10-CM | POA: Insufficient documentation

## 2022-02-05 DIAGNOSIS — Z79899 Other long term (current) drug therapy: Secondary | ICD-10-CM | POA: Diagnosis not present

## 2022-02-05 DIAGNOSIS — E039 Hypothyroidism, unspecified: Secondary | ICD-10-CM | POA: Insufficient documentation

## 2022-02-05 DIAGNOSIS — D509 Iron deficiency anemia, unspecified: Secondary | ICD-10-CM | POA: Diagnosis present

## 2022-02-05 DIAGNOSIS — K219 Gastro-esophageal reflux disease without esophagitis: Secondary | ICD-10-CM | POA: Insufficient documentation

## 2022-02-05 DIAGNOSIS — R5383 Other fatigue: Secondary | ICD-10-CM

## 2022-02-05 DIAGNOSIS — K760 Fatty (change of) liver, not elsewhere classified: Secondary | ICD-10-CM | POA: Insufficient documentation

## 2022-02-05 DIAGNOSIS — Z794 Long term (current) use of insulin: Secondary | ICD-10-CM | POA: Insufficient documentation

## 2022-02-05 DIAGNOSIS — K648 Other hemorrhoids: Secondary | ICD-10-CM | POA: Insufficient documentation

## 2022-02-05 DIAGNOSIS — F5089 Other specified eating disorder: Secondary | ICD-10-CM | POA: Diagnosis not present

## 2022-02-05 DIAGNOSIS — G4700 Insomnia, unspecified: Secondary | ICD-10-CM | POA: Diagnosis not present

## 2022-02-05 DIAGNOSIS — M797 Fibromyalgia: Secondary | ICD-10-CM | POA: Diagnosis not present

## 2022-02-05 DIAGNOSIS — G4733 Obstructive sleep apnea (adult) (pediatric): Secondary | ICD-10-CM | POA: Diagnosis not present

## 2022-02-05 LAB — IRON AND TIBC
Iron: 83 ug/dL (ref 28–170)
Saturation Ratios: 22 % (ref 10.4–31.8)
TIBC: 385 ug/dL (ref 250–450)
UIBC: 302 ug/dL

## 2022-02-05 LAB — CBC WITH DIFFERENTIAL/PLATELET
Abs Immature Granulocytes: 0.02 10*3/uL (ref 0.00–0.07)
Basophils Absolute: 0 10*3/uL (ref 0.0–0.1)
Basophils Relative: 1 %
Eosinophils Absolute: 0.1 10*3/uL (ref 0.0–0.5)
Eosinophils Relative: 2 %
HCT: 39.7 % (ref 36.0–46.0)
Hemoglobin: 13.2 g/dL (ref 12.0–15.0)
Immature Granulocytes: 0 %
Lymphocytes Relative: 30 %
Lymphs Abs: 1.5 10*3/uL (ref 0.7–4.0)
MCH: 29.5 pg (ref 26.0–34.0)
MCHC: 33.2 g/dL (ref 30.0–36.0)
MCV: 88.6 fL (ref 80.0–100.0)
Monocytes Absolute: 0.5 10*3/uL (ref 0.1–1.0)
Monocytes Relative: 11 %
Neutro Abs: 2.8 10*3/uL (ref 1.7–7.7)
Neutrophils Relative %: 56 %
Platelets: 299 10*3/uL (ref 150–400)
RBC: 4.48 MIL/uL (ref 3.87–5.11)
RDW: 16.4 % — ABNORMAL HIGH (ref 11.5–15.5)
WBC: 5 10*3/uL (ref 4.0–10.5)
nRBC: 0 % (ref 0.0–0.2)

## 2022-02-05 LAB — FERRITIN: Ferritin: 172 ng/mL (ref 11–307)

## 2022-02-05 NOTE — Progress Notes (Signed)
She is tired, got er teeth extracted on 11/21/2021. She is eating soft foods, not craving ice often right now. Fluid /swelling of legs and feet per pt

## 2022-02-05 NOTE — Progress Notes (Signed)
CONSULT NOTE  Patient Care Team: Donnie Coffin, MD as PCP - General (Family Medicine) Alisa Graff, FNP as Nurse Practitioner (Family Medicine) Dionisio David, MD as Consulting Physician (Cardiology) Cathi Roan, [facility] (Inactive) as Pharmacist (Pharmacist) Pleasant, Eppie Gibson, RN as [facility]  CHIEF COMPLAINTS/PURPOSE OF CONSULTATION:  Anemia   HISTORY OF PRESENTING ILLNESS:  Amanda [name] 56 y.o. female is here because of low ferritin levels.  Her hemoglobin has been normal- baseline is around 12.5-13.  Most recent ferritin is from 12/14/2020 and was 34, TIBC 455 and iron saturation 12%.  She has a past medical history significant for hypertension, heart failure, sleep apnea, asthma, COPD, OSA, epilepsy, type 2 diabetes, osteoarthritis, acute renal failure and depression.  Patient is followed by Dr. Mortimer Fries for asthma and OSA.  Currently on Breo, Spiriva and albuterol.  She has a CPAP machine she uses nightly.  She had a colonoscopy in 2020 which was negative and a EGD/colonoscopy back in 2018 which showed gastritis, normal esophagus and duodenum.  She had a few internal hemorrhoids and 1 polyp found in the sigmoid colon.  Reports overall feeling fatigued and poorly.  Has ice pica prompting evaluation.  She has tried oral iron in liquid and tablet with poor toleration.  Both caused severe abdominal cramping, diarrhea nausea and vomiting.  Reports no active bleeding and no longer has menstrual cycles.  Reports 2 C-sections and hernia repair surgery with scar tissue.  Eats a fairly normal diet.  Denies a family history of hematology blood disorders.  Reports that her daughter is also iron deficient.  She gets iron infusions here at the cancer center.  Interval History- Chronic low energy. Has sleep apnea- doesn't always use her CPAP machine. Sleeps about 3 hours per night due to insomnia. Working with PCP on this and recently prescribed a medication that  "starts with a B" that has helped some. Previous pica but this has resolved.    MEDICAL HISTORY:  Past Medical History:  Diagnosis Date   ADD (attention deficit disorder)    Anxiety    Arthritis    knees, shoulder, upper back   Asthma    CFS (chronic fatigue syndrome)    Chewing difficulty    CHF (congestive heart failure) (HCC)    Chronic kidney disease    Constipation    Depression    Depression    Diabetes mellitus without complication (HCC)    Dyspnea    Environmental allergies    Fatty liver    Fibromyalgia    GERD (gastroesophageal reflux disease)    GI bleed 10/02/2018   Headache    migraines - 5x/mo   Hypertension    Hypokalemia 10/14/2017   Hypothyroidism    IBS (irritable bowel syndrome)    IDA (iron deficiency anemia) 02/23/2021   Joint pain    Lower extremity edema    Major depressive disorder, single episode    Migraine without aura and without status migrainosus, not intractable    Migraines    Motion sickness    ships   Osteoarthritis    Other specified disorders of thyroid    Sleep apnea    Stroke ([place])    no residual deficits   Swallowing difficulty    Thyroid nodule    bilateral and goiter   Vitamin D deficiency    Wears contact lenses    Wears dentures    partial upper    SURGICAL HISTORY: Past Surgical History:  Procedure Laterality Date   ABDOMINAL HYSTERECTOMY  2000's   ABDOMINAL HYSTERECTOMY     ABDOMINAL SURGERY     laparoscopy x4 with lysis of adhesions   APPENDECTOMY  1986   CESAREAN SECTION  1992   CHOLECYSTECTOMY N/A 04/03/2017   Procedure: LAPAROSCOPIC CHOLECYSTECTOMY;  Surgeon: Olean Ree, MD;  Location: [facility] ORS;  Service: General;  Laterality: N/A;   COLONOSCOPY N/A 10/06/2018   Procedure: COLONOSCOPY;  Surgeon: Lin Landsman, MD;  Location: [facility] ENDOSCOPY;  Service: Gastroenterology;  Laterality: N/A;   COLONOSCOPY WITH PROPOFOL N/A 03/10/2017   Procedure: COLONOSCOPY WITH PROPOFOL;  Surgeon: Manya Silvas, MD;   Location: [facility] ENDOSCOPY;  Service: Endoscopy;  Laterality: N/A;   ECTOPIC PREGNANCY SURGERY  2000's   ESOPHAGOGASTRODUODENOSCOPY (EGD) WITH PROPOFOL N/A 03/10/2017   Procedure: ESOPHAGOGASTRODUODENOSCOPY (EGD) WITH PROPOFOL;  Surgeon: Manya Silvas, MD;  Location: [facility] ENDOSCOPY;  Service: Endoscopy;  Laterality: N/A;   HERNIA REPAIR     JOINT REPLACEMENT Right 12/16/12   knee- medial - makoplasty   KNEE ARTHROSCOPY Right 2006   partial medial and lateral meniscectomies   KNEE ARTHROSCOPY Right 10/06/2015   Procedure: RIGHT KNEE ARTHROSCOPY WITH DEBRIDEMENT;  Surgeon: Leanor Kail, MD;  Location: [facility];  Service: Orthopedics;  Laterality: Right;  Diabetic - insulin and oral meds   ROTATOR CUFF REPAIR Left 2006   TUBAL LIGATION  2000's    SOCIAL HISTORY: Social History   Socioeconomic History   Marital status: Widowed    Spouse name: Dhara Tharaldson   Number of children: Not on file   Years of education: college   Highest education level: Not on file  Occupational History   Occupation: Accountant  Tobacco Use   Smoking status: Never   Smokeless tobacco: Never  Vaping Use   Vaping Use: Never used  Substance and Sexual Activity   Alcohol use: Not Currently   Drug use: Never   Sexual activity: Not Currently    Birth control/protection: Surgical  Other Topics Concern   Not on file  Social History Narrative   ** Merged History Encounter **       Lives at home with son (who is mentally ill)   Ambulates independently.   Husband Elenore Rota passed in June 2021   Daughter TT   Social Determinants of Health   Financial Resource Strain: Not on file  Food Insecurity: Not on file  Transportation Needs: Not on file  Physical Activity: Not on file  Stress: Not on file  Social Connections: Not on file  Intimate Partner Violence: Not on file    FAMILY HISTORY: Family History  Problem Relation Age of Onset   Hypertension Mother    Diabetes Mother    Heart disease  Mother    Depression Mother    Obesity Mother    Hypertension Father    Diabetes Father    Allergies Father    Kidney disease Father    Cancer Father    Breast cancer Neg Hx     ALLERGIES:  is allergic to penicillins, penicillins, ace inhibitors, and ace inhibitors.  MEDICATIONS:  Current Outpatient Medications  Medication Sig Dispense Refill   albuterol (VENTOLIN HFA) 108 (90 Base) MCG/ACT inhaler Inhale 2 puffs into the lungs every 6 (six) hours as needed for wheezing or shortness of breath. 8 g 6   Ascorbic Acid (VITAMIN C CR) 1000 MG TBCR Take 1 tablet by mouth daily.     buprenorphine (BUTRANS) 10 MCG/HR PTWK 1 patch once  a week.     cholecalciferol (VITAMIN D) 25 MCG (1000 UNIT) tablet Take 1,000 Units by mouth daily. 2ooo units now     desvenlafaxine (PRISTIQ) 50 MG 24 hr tablet Take 50 mg by mouth daily.     Dexmethylphenidate HCl 25 MG CP24 Take 1 capsule by mouth every morning.     estradiol (ESTRACE) 0.1 MG/GM vaginal cream      furosemide (LASIX) 40 MG tablet Take 1 tablet (40 mg total) by mouth 2 (two) times daily. 180 tablet 3   hydrocortisone 2.5 % ointment Apply topically 2 (two) times daily.     hydrOXYzine (ATARAX/VISTARIL) 25 MG tablet Take 25 mg by mouth daily.     Insulin Glargine (TOUJEO SOLOSTAR [place]) Inject 62 Units into the skin every morning.     JARDIANCE 25 MG TABS tablet Take 25 mg by mouth daily.     LINZESS 290 MCG CAPS capsule Take 290 mcg by mouth daily.     mirtazapine (REMERON SOL-TAB) 30 MG disintegrating tablet Take 30 mg by mouth at bedtime as needed (Nap).   2   montelukast (SINGULAIR) 10 MG tablet Take 10 mg by mouth daily.     omeprazole (PRILOSEC) 40 MG capsule Take 40 mg by mouth daily.     potassium chloride SA (KLOR-CON) 20 MEQ tablet Take 1 tablet (20 mEq total) by mouth daily. 90 tablet 3   pravastatin (PRAVACHOL) 40 MG tablet Take 40 mg by mouth daily.     pregabalin (LYRICA) 150 MG capsule Take 150 mg by mouth 3 (three) times daily.      PROAIR RESPICLICK 123XX123 (90 Base) MCG/ACT AEPB Inhale 2 puffs into the lungs every 4 (four) hours as needed.     propranolol ER (INDERAL LA) 80 MG 24 hr capsule Take 1 capsule (80 mg total) by mouth daily. 90 capsule 3   Semaglutide, 1 MG/DOSE, (OZEMPIC, 1 MG/DOSE,) 2 MG/1.5ML SOPN Inject 2 mg into the skin once a week.     SUMAtriptan (IMITREX) 50 MG tablet Take 1 tablet (50 mg total) by mouth every 2 (two) hours as needed for migraine. May repeat in 2 hours if headache persists or recurs.  Not to exceed 4 pills in a 24-hour period.  If you find you need more medication and you have taken 4 pills, please go directly to the emergency room or call 911. 10 tablet 0   tiotropium (SPIRIVA HANDIHALER) 18 MCG inhalation capsule Place 1 capsule (18 mcg total) into inhaler and inhale daily. 30 capsule 6   tiZANidine (ZANAFLEX) 4 MG tablet Take 4 mg by mouth every 8 (eight) hours as needed for muscle spasms.      topiramate (TOPAMAX) 100 MG tablet Take 100 mg by mouth 2 (two) times daily.     B-D UF III MINI PEN NEEDLES 31G X 5 MM MISC Inject into the skin daily.     BD PEN NEEDLE MICRO U/F 32G X 6 MM MISC Inject into the skin.     flunisolide (NASALIDE) 25 MCG/ACT (0.025%) SOLN Place into the nose. (Patient not taking: Reported on 06/14/2021)     HUMALOG KWIKPEN 100 UNIT/ML KwikPen Inject into the skin.     Insulin Pen Needle 31G X 6 MM MISC SMARTSIG:1 Needle SUB-Q 4 Times Daily     LORazepam (ATIVAN) 1 MG tablet Take by mouth. (Patient not taking: Reported on 02/05/2022)     TRUEPLUS PEN NEEDLES 31G X 6 MM MISC      VRAYLAR  6 MG CAPS Take 1 capsule by mouth daily.     zolpidem (AMBIEN) 10 MG tablet Take 10 mg by mouth at bedtime.     No current facility-administered medications for this visit.    REVIEW OF SYSTEMS:   Review of Systems  Constitutional:  Positive for malaise/fatigue.   PHYSICAL EXAMINATION: ECOG PERFORMANCE STATUS: 1 - Symptomatic but completely ambulatory  Vitals:   02/05/22 1042  02/05/22 1103  BP: (!) 143/94   Pulse: 71   Resp: (!) 71 16  Temp: 98.4 F (36.9 C)    Filed Weights   02/05/22 1042  Weight: 214 lb 4.8 oz (97.2 kg)    Physical Exam Constitutional:      Appearance: Normal appearance. She is obese.  HENT:     Head: Normocephalic and atraumatic.  Eyes:     Pupils: Pupils are equal, round, and reactive to light.  Cardiovascular:     Rate and Rhythm: Normal rate and regular rhythm.     Heart sounds: Normal heart sounds. No murmur heard. Pulmonary:     Effort: Pulmonary effort is normal.     Breath sounds: Normal breath sounds. No wheezing.  Abdominal:     General: Bowel sounds are normal. There is no distension.     Palpations: Abdomen is soft.     Tenderness: There is no abdominal tenderness.  Musculoskeletal:        General: Normal range of motion.     Cervical back: Normal range of motion.  Skin:    General: Skin is warm and dry.     Findings: No rash.  Neurological:     Mental Status: She is alert and oriented to person, place, and time.  Psychiatric:        Judgment: Judgment normal.    LABORATORY DATA:  I have reviewed the data as listed Recent Results (from the past 2160 hour(s))  CBC with Differential/Platelet     Status: Abnormal   Collection Time: 02/05/22  9:47 AM  Result Value Ref Range   WBC 5.0 4.0 - 10.5 K/uL   RBC 4.48 3.87 - 5.11 MIL/uL   Hemoglobin 13.2 12.0 - 15.0 g/dL   HCT 39.7 36.0 - 46.0 %   MCV 88.6 80.0 - 100.0 fL   MCH 29.5 26.0 - 34.0 pg   MCHC 33.2 30.0 - 36.0 g/dL   RDW 16.4 (H) 11.5 - 15.5 %   Platelets 299 150 - 400 K/uL   nRBC 0.0 0.0 - 0.2 %   Neutrophils Relative % 56 %   Neutro Abs 2.8 1.7 - 7.7 K/uL   Lymphocytes Relative 30 %   Lymphs Abs 1.5 0.7 - 4.0 K/uL   Monocytes Relative 11 %   Monocytes Absolute 0.5 0.1 - 1.0 K/uL   Eosinophils Relative 2 %   Eosinophils Absolute 0.1 0.0 - 0.5 K/uL   Basophils Relative 1 %   Basophils Absolute 0.0 0.0 - 0.1 K/uL   Immature Granulocytes 0 %    Abs Immature Granulocytes 0.02 0.00 - 0.07 K/uL    Comment: Performed at [facility], [address] [place], [place] [address]    RADIOGRAPHIC STUDIES: I have personally reviewed the radiological images as listed and agreed with the findings in the report. No results found.  ASSESSMENT/PLAN:  Low iron levels: Has a longstanding history of iron deficiency although she is not presently anemic. Unable to tolerate oral iron tablets. Denies any recent bleeding. Had colonoscopy in 2018 which showed gastritis  and repeat in 2020 which was negative.  Etiology likely secondary to gastritis and malabsorption. She received 3 doses of Iv iron 02/26/21-03/02/21 for a ferritin of 33 and iron saturation of 11%.  Today her Hgb is 13.2. Iron stores pending. No additional iron needed at this time. F/u in 3 months.   Fatigue: In terms of her fatigue I have encouraged her to look into seeing a sleep specialist who can help with her insomnia further and for her to wear her CPAP machine nightly   Disposition-No iron today. RTC in 3 months for labs (cbc, ferritin, iron panel), see md and possible Iv iron.   No problem-specific Assessment & Plan notes found for this encounter.  I spent 25 minutes dedicated to the care of this patient (face-to-face and non-face-to-face) on the date of the encounter to include what is described in the assessment and plan.    Hughie Closs, PA-C 02/05/22 11:06 AM

## 2022-02-25 ENCOUNTER — Other Ambulatory Visit: Payer: Self-pay | Admitting: *Deleted

## 2022-02-26 NOTE — Progress Notes (Signed)
Patient ID: Amanda [name], female    DOB: 1965-11-19, 56 y.o.   MRN: [id number]  HPI  [name] is a 56 y/o female with a history of asthma, DM, HTN, stroke, thyroid disease, GERD, fibromyalgia, migraines, obstructive sleep apnea and chronic heart failure.   Echo report from 05/09/20 reviewed and showed an EF of 50-55% without LVH. Had echo done 10/04/2018 but unable to view those results. Echo report from 03/29/18 reviewed and showed an EF of 55-60%.  Has not been admitted or been in the ED in the last 6 months.   She presents today for a follow-up visit with a chief complaint of moderate fatigue upon minimal exertion. She describes this as chronic in nature. She has associated shortness of breath, intermittent chest pain, pedal edema, palpitations, abdominal distention, light-headedness, difficulty sleeping and gradual weight gain.   She says that she missed an appointment with [facility] cardiology and has to call and get it rescheduled.   She is drinking 3L of fluids daily because when she drinks the 2L / day, she ends up hypotensive and then in the ED or admitted. Says that she's not adding any salt to her food and that her daughter does all the cooking.   Tried wearing compression socks but edema persisted. Says that in the morning the swelling is less since she's had them elevated all night but as soon as she puts her legs down, the swelling quickly returns.   She hasn't been weighing herself daily but does have working scales. Does not have a BP cuff so isn't able to check her blood pressure at home. She says that nephrology started her on amlodipine due to elevated BP in the office.  Past Medical History:  Diagnosis Date   ADD (attention deficit disorder)    Anxiety    Arthritis    knees, shoulder, upper back   Asthma    CFS (chronic fatigue syndrome)    Chewing difficulty    CHF (congestive heart failure) (HCC)    Chronic kidney disease    Constipation    Depression    Depression     Diabetes mellitus without complication (HCC)    Dyspnea    Environmental allergies    Fatty liver    Fibromyalgia    GERD (gastroesophageal reflux disease)    GI bleed 10/02/2018   Headache    migraines - 5x/mo   Hypertension    Hypokalemia 10/14/2017   Hypothyroidism    IBS (irritable bowel syndrome)    IDA (iron deficiency anemia) 02/23/2021   Joint pain    Lower extremity edema    Major depressive disorder, single episode    Migraine without aura and without status migrainosus, not intractable    Migraines    Motion sickness    ships   Osteoarthritis    Other specified disorders of thyroid    Sleep apnea    Stroke (HCC)    no residual deficits   Swallowing difficulty    Thyroid nodule    bilateral and goiter   Vitamin D deficiency    Wears contact lenses    Wears dentures    partial upper   Past Surgical History:  Procedure Laterality Date   ABDOMINAL HYSTERECTOMY  2000's   ABDOMINAL HYSTERECTOMY     ABDOMINAL SURGERY     laparoscopy x4 with lysis of adhesions   APPENDECTOMY  1986   CESAREAN SECTION  1992   CHOLECYSTECTOMY N/A 04/03/2017   Procedure: LAPAROSCOPIC CHOLECYSTECTOMY;  Surgeon: Olean Ree, MD;  Location: [facility] ORS;  Service: General;  Laterality: N/A;   COLONOSCOPY N/A 10/06/2018   Procedure: COLONOSCOPY;  Surgeon: Lin Landsman, MD;  Location: [facility] ENDOSCOPY;  Service: Gastroenterology;  Laterality: N/A;   COLONOSCOPY WITH PROPOFOL N/A 03/10/2017   Procedure: COLONOSCOPY WITH PROPOFOL;  Surgeon: Manya Silvas, MD;  Location: [facility] ENDOSCOPY;  Service: Endoscopy;  Laterality: N/A;   ECTOPIC PREGNANCY SURGERY  2000's   ESOPHAGOGASTRODUODENOSCOPY (EGD) WITH PROPOFOL N/A 03/10/2017   Procedure: ESOPHAGOGASTRODUODENOSCOPY (EGD) WITH PROPOFOL;  Surgeon: Manya Silvas, MD;  Location: [facility] ENDOSCOPY;  Service: Endoscopy;  Laterality: N/A;   HERNIA REPAIR     JOINT REPLACEMENT Right 12/16/12   knee- medial - makoplasty   KNEE ARTHROSCOPY Right 2006    partial medial and lateral meniscectomies   KNEE ARTHROSCOPY Right 10/06/2015   Procedure: RIGHT KNEE ARTHROSCOPY WITH DEBRIDEMENT;  Surgeon: Leanor Kail, MD;  Location: [place];  Service: Orthopedics;  Laterality: Right;  Diabetic - insulin and oral meds   ROTATOR CUFF REPAIR Left 2006   TUBAL LIGATION  2000's   Family History  Problem Relation Age of Onset   Hypertension Mother    Diabetes Mother    Heart disease Mother    Depression Mother    Obesity Mother    Hypertension Father    Diabetes Father    Allergies Father    Kidney disease Father    Cancer Father    Breast cancer Neg Hx    Social History   Tobacco Use   Smoking status: Never   Smokeless tobacco: Never  Substance Use Topics   Alcohol use: Not Currently   Allergies  Allergen Reactions   Penicillins Anaphylaxis    Has patient had a PCN reaction causing immediate rash, facial/tongue/throat swelling, SOB or lightheadedness with hypotension: Yes Has patient had a PCN reaction causing severe rash involving mucus membranes or skin necrosis: No Has patient had a PCN reaction that required hospitalization No Has patient had a PCN reaction occurring within the last 10 years: No If all of the above answers are "NO", then may proceed with Cephalosporin use.  Has patient had a PCN reaction causing immediate rash, facial/tongue/throat swelling, SOB or lightheadedness with hypotension: Yes Has patient had a PCN reaction causing severe rash involving mucus membranes or skin necrosis: No Has patient had a PCN reaction that required hospitalization No Has patient had a PCN reaction occurring within the last 10 years: No If all of the above answers are "NO", then may proceed with Cephalosporin use. Other reaction(s): Other (See Comments) Severe/almost died Other reaction(s): ANAPHYLAXIS Has patient had a PCN reaction causing immediate rash, facial/tongue/throat swelling, SOB or lightheadedness with hypotension:  Yes Has patient had a PCN reaction causing severe rash involving mucus membranes or skin necrosis: No Has patient had a PCN reaction that required hospitalization No Has patient had a PCN reaction occurring within the last 10 years: No If all of the above answers are "NO", then may proceed with Cephalosporin use. Other reaction(s): Other (See Comments) Severe/almost died Other reaction(s): ANAPHYLAXIS Has patient had a PCN reaction causing immediate rash, facial/tongue/throat swelling, SOB or lightheadedness with hypotension: Yes Has patient had a PCN reaction causing severe rash involving mucus membranes or skin necrosis: No Has patient had a PCN reaction that required hospitalization No Has patient had a PCN reaction occurring within the last 10 years: No If all of the above answers are "NO", then may proceed with Cephalospo... (TRUNCATED)  Penicillins Anaphylaxis   Ace Inhibitors Swelling   Ace Inhibitors Swelling   Prior to Admission medications   Medication Sig Start Date End Date Taking? Authorizing Provider  albuterol (VENTOLIN HFA) 108 (90 Base) MCG/ACT inhaler Inhale 2 puffs into the lungs every 6 (six) hours as needed for wheezing or shortness of breath. 02/20/21  Yes Kasa, Wallis Bamberg, MD  amLODipine (NORVASC) 2.5 MG tablet Take 1 tablet by mouth daily. 02/08/22 02/08/23 Yes [provider]  Ascorbic Acid (VITAMIN C CR) 1000 MG TBCR Take 1 tablet by mouth daily.   Yes [provider]  B-D UF III MINI PEN NEEDLES 31G X 5 MM MISC Inject into the skin daily. 03/18/20  Yes [provider]  BD PEN NEEDLE MICRO U/F 32G X 6 MM MISC Inject into the skin. 06/14/20  Yes [provider]  BELSOMRA 10 MG TABS Take 1 tablet by mouth at bedtime. 01/25/22  Yes [provider]  buprenorphine (BUTRANS) 10 MCG/HR PTWK 1 patch once a week. 03/29/21  Yes [provider]  desvenlafaxine (PRISTIQ) 50 MG 24 hr tablet Take 50 mg by mouth daily. 01/28/21  Yes  [provider]  Dexmethylphenidate HCl 25 MG CP24 Take 1 capsule by mouth every morning. 05/09/21  Yes [provider]  estradiol (ESTRACE) 0.1 MG/GM vaginal cream  10/15/19  Yes [provider]  furosemide (LASIX) 40 MG tablet Take 1 tablet (40 mg total) by mouth 2 (two) times daily. 01/18/22  Yes Krystyne Tewksbury A, FNP  HUMALOG KWIKPEN 100 UNIT/ML KwikPen Inject into the skin. 04/24/21  Yes [provider]  hydrocortisone 2.5 % ointment Apply topically 2 (two) times daily. 01/23/21  Yes [provider]  hydrOXYzine (ATARAX/VISTARIL) 25 MG tablet Take 25 mg by mouth daily. 04/09/21  Yes [provider]  Insulin Glargine (TOUJEO SOLOSTAR ) Inject 62 Units into the skin every morning.   Yes [provider]  Insulin Pen Needle 31G X 6 MM MISC SMARTSIG:1 Needle SUB-Q 4 Times Daily 05/19/20  Yes [provider]  JARDIANCE 25 MG TABS tablet Take 25 mg by mouth daily. 03/18/20  Yes [provider]  LINZESS 290 MCG CAPS capsule Take 290 mcg by mouth daily. 04/12/20  Yes [provider]  LORazepam (ATIVAN) 1 MG tablet Take 1 mg by mouth 2 (two) times daily as needed for anxiety. 09/20/20  Yes [provider]  mirtazapine (REMERON SOL-TAB) 30 MG disintegrating tablet Take 30 mg by mouth daily as needed. 03/18/18  Yes [provider]  montelukast (SINGULAIR) 10 MG tablet Take 10 mg by mouth daily. 04/12/20  Yes [provider]  omeprazole (PRILOSEC) 40 MG capsule Take 40 mg by mouth daily. 04/12/20  Yes [provider]  orlistat (XENICAL) 120 MG capsule Take 1 capsule by mouth 3 (three) times daily. 01/10/22  Yes [provider]  potassium chloride SA (KLOR-CON) 20 MEQ tablet Take 1 tablet (20 mEq total) by mouth daily. 05/25/21  Yes Taft Worthing, Inetta Fermo A, FNP  pravastatin (PRAVACHOL) 40 MG tablet Take 40 mg by mouth daily. 04/12/20  Yes [provider]  pregabalin (LYRICA) 150 MG capsule  Take 150 mg by mouth 3 (three) times daily. 04/12/20  Yes [provider]  PROAIR RESPICLICK 108 (90 Base) MCG/ACT AEPB Inhale 2 puffs into the lungs every 4 (four) hours as needed. 11/12/19  Yes [provider]  propranolol ER (INDERAL LA) 80 MG 24 hr capsule Take 1 capsule (80 mg total) by mouth daily.  05/25/21  Yes Adiana Smelcer A, FNP  Semaglutide, 1 MG/DOSE, (OZEMPIC, 1 MG/DOSE,) 2 MG/1.5ML SOPN Inject 2 mg into the skin once a week. On Fridays   Yes [provider]  SUMAtriptan (IMITREX) 50 MG tablet Take 1 tablet (50 mg total) by mouth every 2 (two) hours as needed for migraine. May repeat in 2 hours if headache persists or recurs.  Not to exceed 4 pills in a 24-hour period.  If you find you need more medication and you have taken 4 pills, please go directly to the emergency room or call 911. 11/13/20  Yes Delton See, MD  tiotropium (SPIRIVA HANDIHALER) 18 MCG inhalation capsule Place 1 capsule (18 mcg total) into inhaler and inhale daily. 02/20/21 02/27/22 Yes Kasa, Wallis Bamberg, MD  tiZANidine (ZANAFLEX) 4 MG tablet Take 4 mg by mouth every 8 (eight) hours as needed for muscle spasms.    Yes [provider]  topiramate (TOPAMAX) 100 MG tablet Take 100 mg by mouth 2 (two) times daily. 04/12/20  Yes [provider]  TRUEPLUS PEN NEEDLES 31G X 6 MM MISC  05/22/20  Yes [provider]  VITAMIN D, CHOLECALCIFEROL, PO Take 2,000 Units by mouth daily.   Yes [provider]  VRAYLAR 6 MG CAPS Take 1 capsule by mouth daily. 05/09/21  Yes [provider]  flunisolide (NASALIDE) 25 MCG/ACT (0.025%) SOLN Place into the nose. Patient not taking: Reported on 06/14/2021    [provider]    Review of Systems  Constitutional:  Positive for fatigue (with minimal exertion). Negative for appetite change.  HENT:  Negative for congestion and postnasal drip.   Eyes: Negative.   Respiratory:  Positive for shortness of breath (with moderate  exertion). Negative for cough, chest tightness and wheezing.   Cardiovascular:  Positive for chest pain ("little bit"), palpitations and leg swelling (both feet).  Gastrointestinal:  Positive for abdominal distention ("little bloated"). Negative for abdominal pain.  Endocrine: Negative.   Genitourinary: Negative.   Musculoskeletal:  Positive for arthralgias (right knee). Negative for back pain.  Skin: Negative.   Allergic/Immunologic: Negative.   Neurological:  Positive for light-headedness (sometimes). Negative for dizziness.  Hematological:  Negative for adenopathy. Does not bruise/bleed easily.  Psychiatric/Behavioral:  Positive for sleep disturbance (sleeping on 2 pillows). Negative for dysphoric mood. The patient is not nervous/anxious.    Vitals:   02/27/22 1118  BP: 138/73  Pulse: 92  Resp: 18  SpO2: 97%  Weight: 219 lb 8 oz (99.6 kg)  Height: 5\' 5"  (1.651 m)   Wt Readings from Last 3 Encounters:  02/27/22 219 lb 8 oz (99.6 kg)  02/05/22 214 lb 4.8 oz (97.2 kg)  01/18/22 215 lb (97.5 kg)   Lab Results  Component Value Date   CREATININE 0.91 05/24/2021   CREATININE 0.98 02/22/2021   CREATININE 0.86 09/24/2020   Physical Exam Vitals and nursing note reviewed.  Constitutional:      Appearance: She is well-developed.  HENT:     Head: Normocephalic and atraumatic.  Neck:     Thyroid: No thyromegaly.  Cardiovascular:     Rate and Rhythm: Normal rate and regular rhythm.  Pulmonary:     Effort: Pulmonary effort is normal. No respiratory distress.     Breath sounds: No wheezing or rales.  Abdominal:     General: There is distension.     Palpations: Abdomen is soft.  Musculoskeletal:     Cervical back: Normal range of motion and neck supple.  Right lower leg: No tenderness. Edema (2+ pitting) present.     Left lower leg: No tenderness. Edema (2+ pitting) present.  Skin:    General: Skin is warm and dry.  Neurological:     Mental Status: She is alert and oriented  to person, place, and time.  Psychiatric:        Behavior: Behavior normal.    Assessment & Plan:  1: Chronic heart failure with preserved ejection fraction without structural changes- - NYHA class III - euvolemic today - not weighing daily but she does have scales; instructed to resume daily weighing so that she can call for an overnight weight gain of >2 pounds or a weekly weight gain of >5 pounds - weight up 4 pounds from last visit here 5 weeks ago - for today and tomorrow, she will take an additional 40mg  furosemide and additional 35meq potassium - followed with cardiology Humphrey Rolls) in the past but has been released from his practice due to NS; planning on getting established with cardiology at [facility]; encouraged her, again, to call and get an appointment scheduled - echo order placed; this has been scheduled for 03/25/22 - not adding salt and trying to follow a low sodium diet; says that her daughter does all the cooking; denies eating out much - drinking ~ 3L of water daily as she ends up hypotensive if she only drinks 2L  - BNP 09/12/20 was 64.0  - PharmD reconciled medications with the patient  2: HTN- - BP mildly elevated (138/73) - rx provided for her to get a BP cuff; reviewed to check it daily with varying the times taken after sitting still for a few minutes with feet flat on the floor - saw PCP Clide Deutscher at [facility]) 01/15/22 - BMP 02/08/22 reviewed and showed sodium 141, potassium 4.4, creatinine 0.96 and GFR >60  3: DM- - A1c 02/08/22 was 7.2% - glucose at home today was 80 - saw endocrinology (Caraccio) 01/15/22 - saw nephrology Percell Miller) 02/08/22  4: Asthma- - saw pulmonology Humphrey Rolls) 05/08/21  5: Lymphedema- - stage 2 - limited in her ability to exercise due to her symptoms - has been wearing compression socks daily without improvement of edema - elevate legs when sitting for long periods of time but edema persists; goes down slightly overnight - will make referral for  lymphapress compression boots   Patient did not bring her medications nor a list. Each medication was verbally reviewed with the patient and she was encouraged to bring the bottles to every visit to confirm accuracy of list.  Return in 1 month, sooner if needed.

## 2022-02-27 ENCOUNTER — Ambulatory Visit: Payer: Medicare Other | Attending: Family | Admitting: Family

## 2022-02-27 ENCOUNTER — Encounter: Payer: Self-pay | Admitting: Family

## 2022-02-27 VITALS — BP 138/73 | HR 92 | Resp 18 | Ht 65.0 in | Wt 219.5 lb

## 2022-02-27 DIAGNOSIS — I130 Hypertensive heart and chronic kidney disease with heart failure and stage 1 through stage 4 chronic kidney disease, or unspecified chronic kidney disease: Secondary | ICD-10-CM | POA: Diagnosis present

## 2022-02-27 DIAGNOSIS — I890 Lymphedema, not elsewhere classified: Secondary | ICD-10-CM | POA: Diagnosis not present

## 2022-02-27 DIAGNOSIS — Z8673 Personal history of transient ischemic attack (TIA), and cerebral infarction without residual deficits: Secondary | ICD-10-CM | POA: Insufficient documentation

## 2022-02-27 DIAGNOSIS — J45909 Unspecified asthma, uncomplicated: Secondary | ICD-10-CM | POA: Diagnosis not present

## 2022-02-27 DIAGNOSIS — I5032 Chronic diastolic (congestive) heart failure: Secondary | ICD-10-CM | POA: Insufficient documentation

## 2022-02-27 DIAGNOSIS — M797 Fibromyalgia: Secondary | ICD-10-CM | POA: Insufficient documentation

## 2022-02-27 DIAGNOSIS — K219 Gastro-esophageal reflux disease without esophagitis: Secondary | ICD-10-CM | POA: Diagnosis not present

## 2022-02-27 DIAGNOSIS — J4540 Moderate persistent asthma, uncomplicated: Secondary | ICD-10-CM

## 2022-02-27 DIAGNOSIS — I959 Hypotension, unspecified: Secondary | ICD-10-CM | POA: Diagnosis not present

## 2022-02-27 DIAGNOSIS — E119 Type 2 diabetes mellitus without complications: Secondary | ICD-10-CM

## 2022-02-27 DIAGNOSIS — I10 Essential (primary) hypertension: Secondary | ICD-10-CM

## 2022-02-27 DIAGNOSIS — G4733 Obstructive sleep apnea (adult) (pediatric): Secondary | ICD-10-CM | POA: Insufficient documentation

## 2022-02-27 DIAGNOSIS — E1122 Type 2 diabetes mellitus with diabetic chronic kidney disease: Secondary | ICD-10-CM | POA: Insufficient documentation

## 2022-02-27 DIAGNOSIS — Z794 Long term (current) use of insulin: Secondary | ICD-10-CM

## 2022-02-27 NOTE — Patient Instructions (Addendum)
Continue weighing daily and call for an overnight weight gain of 3 pounds or more or a weekly weight gain of more than 5 pounds.   If you have voicemail, please make sure your mailbox is cleaned out so that we may leave a message and please make sure to listen to any voicemails.    For today and tomorrow, take an extra furosemide & potassium tablet.   I will place a referral in for the compression boots and they will contact you.    Call [facility] cardiology and get your appointment rescheduled.

## 2022-03-25 ENCOUNTER — Ambulatory Visit: Admission: RE | Admit: 2022-03-25 | Payer: Medicare Other | Source: Ambulatory Visit

## 2022-03-28 ENCOUNTER — Telehealth: Payer: Self-pay | Admitting: Family

## 2022-03-28 ENCOUNTER — Ambulatory Visit: Payer: Medicare Other | Admitting: Family

## 2022-03-28 NOTE — Telephone Encounter (Signed)
Patient did not show for her [facility] appointment on 03/28/22. Will attempt to reschedule.   

## 2022-03-28 NOTE — Progress Notes (Deleted)
Patient ID: Amanda [name], female    DOB: May 24, 1966, 56 y.o.   MRN: [id number]  HPI  [name] is a 56 y/o female with a history of asthma, DM, HTN, stroke, thyroid disease, GERD, fibromyalgia, migraines, obstructive sleep apnea and chronic heart failure.   Echo report from 05/09/20 reviewed and showed an EF of 50-55% without LVH. Had echo done 10/04/2018 but unable to view those results. Echo report from 03/29/18 reviewed and showed an EF of 55-60%.  Has not been admitted or been in the ED in the last 6 months.   She presents today for a follow-up visit with a chief complaint of   Past Medical History:  Diagnosis Date   ADD (attention deficit disorder)    Anxiety    Arthritis    knees, shoulder, upper back   Asthma    CFS (chronic fatigue syndrome)    Chewing difficulty    CHF (congestive heart failure) (HCC)    Chronic kidney disease    Constipation    Depression    Depression    Diabetes mellitus without complication (HCC)    Dyspnea    Environmental allergies    Fatty liver    Fibromyalgia    GERD (gastroesophageal reflux disease)    GI bleed 10/02/2018   Headache    migraines - 5x/mo   Hypertension    Hypokalemia 10/14/2017   Hypothyroidism    IBS (irritable bowel syndrome)    IDA (iron deficiency anemia) 02/23/2021   Joint pain    Lower extremity edema    Major depressive disorder, single episode    Migraine without aura and without status migrainosus, not intractable    Migraines    Motion sickness    ships   Osteoarthritis    Other specified disorders of thyroid    Sleep apnea    Stroke ([place])    no residual deficits   Swallowing difficulty    Thyroid nodule    bilateral and goiter   Vitamin D deficiency    Wears contact lenses    Wears dentures    partial upper   Past Surgical History:  Procedure Laterality Date   ABDOMINAL HYSTERECTOMY  2000's   ABDOMINAL HYSTERECTOMY     ABDOMINAL SURGERY     laparoscopy x4 with lysis of adhesions   [place] N/A 04/03/2017   Procedure: LAPAROSCOPIC CHOLECYSTECTOMY;  Surgeon: Olean Ree, MD;  Location: [facility] ORS;  Service: General;  Laterality: N/A;   COLONOSCOPY N/A 10/06/2018   Procedure: COLONOSCOPY;  Surgeon: Lin Landsman, MD;  Location: [facility] ENDOSCOPY;  Service: Gastroenterology;  Laterality: N/A;   COLONOSCOPY WITH PROPOFOL N/A 03/10/2017   Procedure: COLONOSCOPY WITH PROPOFOL;  Surgeon: Manya Silvas, MD;  Location[facility] ENDOSCOPY;  Service: Endoscopy;  Laterality: N/A;   ECTOPIC PREGNANCY SURGERY  2000's   ESOPHAGOGASTRODUODENOSCOPY (EGD) WITH PROPOFOL N/A 03/10/2017   Procedure: ESOPHAGOGASTRODUODENOSCOPY (EGD) WITH PROPOFOL;  Surgeon: Manya Silvas, MD;  Location: [facility] ENDOSCOPY;  Service: Endoscopy;  Laterality: N/A;   HERNIA REPAIR     JOINT REPLACEMENT Right 12/16/12   knee- medial - makoplasty   KNEE ARTHROSCOPY Right 2006   partial medial and lateral meniscectomies   KNEE ARTHROSCOPY Right 10/06/2015   Procedure: RIGHT KNEE ARTHROSCOPY WITH DEBRIDEMENT;  Surgeon: Leanor Kail, MD;  Location: [place];  Service: Orthopedics;  Laterality: Right;  Diabetic - insulin and oral meds   ROTATOR CUFF REPAIR Left  2006   TUBAL LIGATION  2000's   Family History  Problem Relation Age of Onset   Hypertension Mother    Diabetes Mother    Heart disease Mother    Depression Mother    Obesity Mother    Hypertension Father    Diabetes Father    Allergies Father    Kidney disease Father    Cancer Father    Breast cancer Neg Hx    Social History   Tobacco Use   Smoking status: Never   Smokeless tobacco: Never  Substance Use Topics   Alcohol use: Not Currently   Allergies  Allergen Reactions   Penicillins Anaphylaxis    Has patient had a PCN reaction causing immediate rash, facial/tongue/throat swelling, SOB or lightheadedness with hypotension: Yes Has patient had a PCN reaction causing severe rash involving mucus  membranes or skin necrosis: No Has patient had a PCN reaction that required hospitalization No Has patient had a PCN reaction occurring within the last 10 years: No If all of the above answers are "NO", then may proceed with Cephalosporin use.  Has patient had a PCN reaction causing immediate rash, facial/tongue/throat swelling, SOB or lightheadedness with hypotension: Yes Has patient had a PCN reaction causing severe rash involving mucus membranes or skin necrosis: No Has patient had a PCN reaction that required hospitalization No Has patient had a PCN reaction occurring within the last 10 years: No If all of the above answers are "NO", then may proceed with Cephalosporin use. Other reaction(s): Other (See Comments) Severe/almost died Other reaction(s): ANAPHYLAXIS Has patient had a PCN reaction causing immediate rash, facial/tongue/throat swelling, SOB or lightheadedness with hypotension: Yes Has patient had a PCN reaction causing severe rash involving mucus membranes or skin necrosis: No Has patient had a PCN reaction that required hospitalization No Has patient had a PCN reaction occurring within the last 10 years: No If all of the above answers are "NO", then may proceed with Cephalosporin use. Other reaction(s): Other (See Comments) Severe/almost died Other reaction(s): ANAPHYLAXIS Has patient had a PCN reaction causing immediate rash, facial/tongue/throat swelling, SOB or lightheadedness with hypotension: Yes Has patient had a PCN reaction causing severe rash involving mucus membranes or skin necrosis: No Has patient had a PCN reaction that required hospitalization No Has patient had a PCN reaction occurring within the last 10 years: No If all of the above answers are "NO", then may proceed with Cephalospo... (TRUNCATED)   Penicillins Anaphylaxis   Ace Inhibitors Swelling   Ace Inhibitors Swelling     Review of Systems  Constitutional:  Positive for fatigue (with minimal  exertion). Negative for appetite change.  HENT:  Negative for congestion and postnasal drip.   Eyes: Negative.   Respiratory:  Positive for shortness of breath (with moderate exertion). Negative for cough, chest tightness and wheezing.   Cardiovascular:  Positive for chest pain ("little bit"), palpitations and leg swelling (both feet).  Gastrointestinal:  Positive for abdominal distention ("little bloated"). Negative for abdominal pain.  Endocrine: Negative.   Genitourinary: Negative.   Musculoskeletal:  Positive for arthralgias (right knee). Negative for back pain.  Skin: Negative.   Allergic/Immunologic: Negative.   Neurological:  Positive for light-headedness (sometimes). Negative for dizziness.  Hematological:  Negative for adenopathy. Does not bruise/bleed easily.  Psychiatric/Behavioral:  Positive for sleep disturbance (sleeping on 2 pillows). Negative for dysphoric mood. The patient is not nervous/anxious.      Physical Exam Vitals and nursing note reviewed.  Constitutional:  Appearance: She is well-developed.  HENT:     Head: Normocephalic and atraumatic.  Neck:     Thyroid: No thyromegaly.  Cardiovascular:     Rate and Rhythm: Normal rate and regular rhythm.  Pulmonary:     Effort: Pulmonary effort is normal. No respiratory distress.     Breath sounds: No wheezing or rales.  Abdominal:     General: There is distension.     Palpations: Abdomen is soft.  Musculoskeletal:     Cervical back: Normal range of motion and neck supple.     Right lower leg: No tenderness. Edema (2+ pitting) present.     Left lower leg: No tenderness. Edema (2+ pitting) present.  Skin:    General: Skin is warm and dry.  Neurological:     Mental Status: She is alert and oriented to person, place, and time.  Psychiatric:        Behavior: Behavior normal.    Assessment & Plan:  1: Chronic heart failure with preserved ejection fraction without structural changes- - NYHA class III -  euvolemic today - not weighing daily but she does have scales; instructed to resume daily weighing so that she can call for an overnight weight gain of >2 pounds or a weekly weight gain of >5 pounds - weight 219.8 pounds from last visit here 1 month ago - followed with cardiology [name]) in the past but has been released from his practice due to NS; planning on getting established with cardiology at [facility]; encouraged her, again, to call and get an appointment scheduled - NS for echo on 03/25/22 - not adding salt and trying to follow a low sodium diet; says that her daughter does all the cooking; denies eating out much - drinking ~ 3L of water daily as she ends up hypotensive if she only drinks 2L  - BNP 09/12/20 was 64.0    2: HTN- - BP  - saw PCP Letta Pate at [facility]) 01/15/22 - BMP 02/08/22 reviewed and showed sodium 141, potassium 4.4, creatinine 0.96 and GFR >60  3: DM- - A1c 02/08/22 was 7.2% - glucose at home today was  - saw endocrinology ([place]) 01/15/22 - saw nephrology Eulah Pont) 02/08/22  4: Asthma- - saw pulmonology [name]) 05/08/21  5: Lymphedema- - stage 2 - limited in her ability to exercise due to her symptoms - has been wearing compression socks daily without improvement of edema - elevate legs when sitting for long periods of time but edema persists; goes down slightly overnight - will make referral for lymphapress compression boots   Patient did not bring her medications nor a list. Each medication was verbally reviewed with the patient and she was encouraged to bring the bottles to every visit to confirm accuracy of list.

## 2022-04-10 ENCOUNTER — Encounter (INDEPENDENT_AMBULATORY_CARE_PROVIDER_SITE_OTHER): Payer: Self-pay

## 2022-05-01 ENCOUNTER — Telehealth: Payer: Medicare Other | Admitting: Pulmonary Disease

## 2022-05-03 ENCOUNTER — Encounter: Payer: Self-pay | Admitting: Oncology

## 2022-05-03 MED FILL — Iron Sucrose Inj 20 MG/ML (Fe Equiv): INTRAVENOUS | Qty: 10 | Status: AC

## 2022-05-06 NOTE — Progress Notes (Deleted)
[place]  Telephone:([phone]) [phone] Fax:(336) [phone]  ID: Amanda [name] OB: 1966-03-22  MR#: [id number]  [id number]  Patient Care Team: Donnie Coffin, MD as PCP - General (Family Medicine) Alisa Graff, FNP as Nurse Practitioner (Family Medicine) Dionisio David, MD as Consulting Physician (Cardiology) Cathi Roan, [facility] (Inactive) as Pharmacist (Pharmacist) Pleasant, Eppie Gibson, RN as [place] Management  CHIEF COMPLAINT:  Iron deficiency anemia.  INTERVAL HISTORY: ***  REVIEW OF SYSTEMS:   ROS  As per HPI. Otherwise, a complete review of systems is negative.  PAST MEDICAL HISTORY: Past Medical History:  Diagnosis Date   ADD (attention deficit disorder)    Anxiety    Arthritis    knees, shoulder, upper back   Asthma    CFS (chronic fatigue syndrome)    Chewing difficulty    CHF (congestive heart failure) (HCC)    Chronic kidney disease    Constipation    Depression    Depression    Diabetes mellitus without complication (HCC)    Dyspnea    Environmental allergies    Fatty liver    Fibromyalgia    GERD (gastroesophageal reflux disease)    GI bleed 10/02/2018   Headache    migraines - 5x/mo   Hypertension    Hypokalemia 10/14/2017   Hypothyroidism    IBS (irritable bowel syndrome)    IDA (iron deficiency anemia) 02/23/2021   Joint pain    Lower extremity edema    Major depressive disorder, single episode    Migraine without aura and without status migrainosus, not intractable    Migraines    Motion sickness    ships   Osteoarthritis    Other specified disorders of thyroid    Sleep apnea    Stroke ([place])    no residual deficits   Swallowing difficulty    Thyroid nodule    bilateral and goiter   Vitamin D deficiency    Wears contact lenses    Wears dentures    partial upper    PAST SURGICAL HISTORY: Past Surgical History:  Procedure Laterality Date   ABDOMINAL HYSTERECTOMY  2000's   ABDOMINAL  HYSTERECTOMY     ABDOMINAL SURGERY     laparoscopy x4 with lysis of adhesions   [place] N/A 04/03/2017   Procedure: LAPAROSCOPIC CHOLECYSTECTOMY;  Surgeon: Olean Ree, MD;  Location: [facility] ORS;  Service: General;  Laterality: N/A;   COLONOSCOPY N/A 10/06/2018   Procedure: COLONOSCOPY;  Surgeon: Lin Landsman, MD;  Location: [facility] ENDOSCOPY;  Service: Gastroenterology;  Laterality: N/A;   COLONOSCOPY WITH PROPOFOL N/A 03/10/2017   Procedure: COLONOSCOPY WITH PROPOFOL;  Surgeon: Manya Silvas, MD;  Location: [facility] ENDOSCOPY;  Service: Endoscopy;  Laterality: N/A;   ECTOPIC PREGNANCY SURGERY  2000's   ESOPHAGOGASTRODUODENOSCOPY (EGD) WITH PROPOFOL N/A 03/10/2017   Procedure: ESOPHAGOGASTRODUODENOSCOPY (EGD) WITH PROPOFOL;  Surgeon: Manya Silvas, MD;  Location: [facility] ENDOSCOPY;  Service: Endoscopy;  Laterality: N/A;   HERNIA REPAIR     JOINT REPLACEMENT Right 12/16/12   knee- medial - makoplasty   KNEE ARTHROSCOPY Right 2006   partial medial and lateral meniscectomies   KNEE ARTHROSCOPY Right 10/06/2015   Procedure: RIGHT KNEE ARTHROSCOPY WITH DEBRIDEMENT;  Surgeon: Leanor Kail, MD;  Location: [place];  Service: Orthopedics;  Laterality: Right;  Diabetic - insulin and oral meds   ROTATOR CUFF REPAIR Left 2006   TUBAL LIGATION  2000's  FAMILY HISTORY: Family History  Problem Relation Age of Onset   Hypertension Mother    Diabetes Mother    Heart disease Mother    Depression Mother    Obesity Mother    Hypertension Father    Diabetes Father    Allergies Father    Kidney disease Father    Cancer Father    Breast cancer Neg Hx     ADVANCED DIRECTIVES (Y/N):  N  HEALTH MAINTENANCE: Social History   Tobacco Use   Smoking status: Never   Smokeless tobacco: Never  Vaping Use   Vaping Use: Never used  Substance Use Topics   Alcohol use: Not Currently   Drug use: Never     Colonoscopy:  PAP:  Bone  density:  Lipid panel:  Allergies  Allergen Reactions   Penicillins Anaphylaxis    Has patient had a PCN reaction causing immediate rash, facial/tongue/throat swelling, SOB or lightheadedness with hypotension: Yes Has patient had a PCN reaction causing severe rash involving mucus membranes or skin necrosis: No Has patient had a PCN reaction that required hospitalization No Has patient had a PCN reaction occurring within the last 10 years: No If all of the above answers are "NO", then may proceed with Cephalosporin use.  Has patient had a PCN reaction causing immediate rash, facial/tongue/throat swelling, SOB or lightheadedness with hypotension: Yes Has patient had a PCN reaction causing severe rash involving mucus membranes or skin necrosis: No Has patient had a PCN reaction that required hospitalization No Has patient had a PCN reaction occurring within the last 10 years: No If all of the above answers are "NO", then may proceed with Cephalosporin use. Other reaction(s): Other (See Comments) Severe/almost died Other reaction(s): ANAPHYLAXIS Has patient had a PCN reaction causing immediate rash, facial/tongue/throat swelling, SOB or lightheadedness with hypotension: Yes Has patient had a PCN reaction causing severe rash involving mucus membranes or skin necrosis: No Has patient had a PCN reaction that required hospitalization No Has patient had a PCN reaction occurring within the last 10 years: No If all of the above answers are "NO", then may proceed with Cephalosporin use. Other reaction(s): Other (See Comments) Severe/almost died Other reaction(s): ANAPHYLAXIS Has patient had a PCN reaction causing immediate rash, facial/tongue/throat swelling, SOB or lightheadedness with hypotension: Yes Has patient had a PCN reaction causing severe rash involving mucus membranes or skin necrosis: No Has patient had a PCN reaction that required hospitalization No Has patient had a PCN reaction  occurring within the last 10 years: No If all of the above answers are "NO", then may proceed with Cephalospo... (TRUNCATED)   Penicillins Anaphylaxis   Ace Inhibitors Swelling   Ace Inhibitors Swelling    Current Outpatient Medications  Medication Sig Dispense Refill   albuterol (VENTOLIN HFA) 108 (90 Base) MCG/ACT inhaler Inhale 2 puffs into the lungs every 6 (six) hours as needed for wheezing or shortness of breath. 8 g 6   amLODipine (NORVASC) 2.5 MG tablet Take 1 tablet by mouth daily.     Ascorbic Acid (VITAMIN C CR) 1000 MG TBCR Take 1 tablet by mouth daily.     B-D UF III MINI PEN NEEDLES 31G X 5 MM MISC Inject into the skin daily.     BD PEN NEEDLE MICRO U/F 32G X 6 MM MISC Inject into the skin.     BELSOMRA 10 MG TABS Take 1 tablet by mouth at bedtime.     buprenorphine (BUTRANS) 10 MCG/HR PTWK 1 patch once  a week.     desvenlafaxine (PRISTIQ) 50 MG 24 hr tablet Take 50 mg by mouth daily.     Dexmethylphenidate HCl 25 MG CP24 Take 1 capsule by mouth every morning.     estradiol (ESTRACE) 0.1 MG/GM vaginal cream      flunisolide (NASALIDE) 25 MCG/ACT (0.025%) SOLN Place into the nose. (Patient not taking: Reported on 06/14/2021)     furosemide (LASIX) 40 MG tablet Take 1 tablet (40 mg total) by mouth 2 (two) times daily. 180 tablet 3   HUMALOG KWIKPEN 100 UNIT/ML KwikPen Inject into the skin.     hydrocortisone 2.5 % ointment Apply topically 2 (two) times daily.     hydrOXYzine (ATARAX/VISTARIL) 25 MG tablet Take 25 mg by mouth daily.     Insulin Glargine (TOUJEO SOLOSTAR [place]) Inject 62 Units into the skin every morning.     Insulin Pen Needle 31G X 6 MM MISC SMARTSIG:1 Needle SUB-Q 4 Times Daily     JARDIANCE 25 MG TABS tablet Take 25 mg by mouth daily.     LINZESS 290 MCG CAPS capsule Take 290 mcg by mouth daily.     LORazepam (ATIVAN) 1 MG tablet Take 1 mg by mouth 2 (two) times daily as needed for anxiety.     mirtazapine (REMERON SOL-TAB) 30 MG disintegrating tablet Take 30  mg by mouth daily as needed.  2   montelukast (SINGULAIR) 10 MG tablet Take 10 mg by mouth daily.     omeprazole (PRILOSEC) 40 MG capsule Take 40 mg by mouth daily.     orlistat (XENICAL) 120 MG capsule Take 1 capsule by mouth 3 (three) times daily.     potassium chloride SA (KLOR-CON) 20 MEQ tablet Take 1 tablet (20 mEq total) by mouth daily. 90 tablet 3   pravastatin (PRAVACHOL) 40 MG tablet Take 40 mg by mouth daily.     pregabalin (LYRICA) 150 MG capsule Take 150 mg by mouth 3 (three) times daily.     PROAIR RESPICLICK 108 (90 Base) MCG/ACT AEPB Inhale 2 puffs into the lungs every 4 (four) hours as needed.     propranolol ER (INDERAL LA) 80 MG 24 hr capsule Take 1 capsule (80 mg total) by mouth daily. 90 capsule 3   Semaglutide, 1 MG/DOSE, (OZEMPIC, 1 MG/DOSE,) 2 MG/1.5ML SOPN Inject 2 mg into the skin once a week. On Fridays     SUMAtriptan (IMITREX) 50 MG tablet Take 1 tablet (50 mg total) by mouth every 2 (two) hours as needed for migraine. May repeat in 2 hours if headache persists or recurs.  Not to exceed 4 pills in a 24-hour period.  If you find you need more medication and you have taken 4 pills, please go directly to the emergency room or call 911. 10 tablet 0   tiotropium (SPIRIVA HANDIHALER) 18 MCG inhalation capsule Place 1 capsule (18 mcg total) into inhaler and inhale daily. 30 capsule 6   tiZANidine (ZANAFLEX) 4 MG tablet Take 4 mg by mouth every 8 (eight) hours as needed for muscle spasms.      topiramate (TOPAMAX) 100 MG tablet Take 100 mg by mouth 2 (two) times daily.     TRUEPLUS PEN NEEDLES 31G X 6 MM MISC      VITAMIN D, CHOLECALCIFEROL, PO Take 2,000 Units by mouth daily.     VRAYLAR 6 MG CAPS Take 1 capsule by mouth daily.     No current facility-administered medications for this visit.    OBJECTIVE:  There were no vitals filed for this visit.   There is no height or weight on file to calculate BMI.    ECOG FS:{CHL ONC [id number]  General: Well-developed,  well-nourished, no acute distress. Eyes: Pink conjunctiva, anicteric sclera. HEENT: Normocephalic, moist mucous membranes. Lungs: No audible wheezing or coughing. Heart: Regular rate and rhythm. Abdomen: Soft, nontender, no obvious distention. Musculoskeletal: No edema, cyanosis, or clubbing. Neuro: Alert, answering all questions appropriately. Cranial nerves grossly intact. Skin: No rashes or petechiae noted. Psych: Normal affect. Lymphatics: No cervical, calvicular, axillary or inguinal LAD.   LAB RESULTS:  Lab Results  Component Value Date   NA 139 05/24/2021   K 4.0 05/24/2021   CL 106 05/24/2021   CO2 28 05/24/2021   GLUCOSE 91 05/24/2021   BUN 16 05/24/2021   CREATININE 0.91 05/24/2021   CALCIUM 9.5 05/24/2021   PROT 7.8 02/22/2021   ALBUMIN 4.1 02/22/2021   AST 22 02/22/2021   ALT 30 02/22/2021   ALKPHOS 126 02/22/2021   BILITOT 0.4 02/22/2021   GFRNONAA >60 05/24/2021   GFRAA >60 05/09/2020    Lab Results  Component Value Date   WBC 5.0 02/05/2022   NEUTROABS 2.8 02/05/2022   HGB 13.2 02/05/2022   HCT 39.7 02/05/2022   MCV 88.6 02/05/2022   PLT 299 02/05/2022     STUDIES: No results found.  ASSESSMENT: Iron deficiency anemia.  PLAN:     Iron deficiency anemia:  Patient expressed understanding and was in agreement with this plan. She also understands that She can call clinic at any time with any questions, concerns, or complaints.    Cancer Staging  No matching staging information was found for the patient.  Lloyd Huger, MD   05/06/2022 11:21 PM

## 2022-05-07 ENCOUNTER — Inpatient Hospital Stay: Payer: Medicare Other | Attending: Oncology

## 2022-05-07 ENCOUNTER — Inpatient Hospital Stay: Payer: Medicare Other

## 2022-05-07 ENCOUNTER — Inpatient Hospital Stay: Payer: Medicare Other | Admitting: Oncology

## 2022-05-07 DIAGNOSIS — D509 Iron deficiency anemia, unspecified: Secondary | ICD-10-CM

## 2022-05-09 ENCOUNTER — Ambulatory Visit: Payer: Medicare Other | Admitting: Family

## 2022-05-14 ENCOUNTER — Ambulatory Visit: Payer: Medicare Other | Admitting: Student in an Organized Health Care Education/Training Program

## 2022-05-26 NOTE — Progress Notes (Signed)
Patient ID: Amanda [name], female    DOB: 1965/11/12, 56 y.o.   MRN: [id number]  HPI  [name] is a 56 y/o female with a history of asthma, DM, HTN, stroke, thyroid disease, GERD, fibromyalgia, migraines, obstructive sleep apnea and chronic heart failure.   Echo report from 05/09/20 reviewed and showed an EF of 50-55% without LVH. Had echo done 10/04/2018 but unable to view those results. Echo report from 03/29/18 reviewed and showed an EF of 55-60%.  Has not been admitted or been in the ED in the last 6 months.   She presents today for a follow-up visit with a chief complaint of moderate fatigue with minimal exertion. Describes this as chronic in nature having been present for years but she does feel like it's improving. She has associated shortness of breath, abdominal distention, light-headed, chronic pain and chronic difficulty sleeping along with this. She denies any palpitations, pedal edema, chest pain, wheezing, cough or weight gain.   Is taking ozempic and now lifting weights and has lost quite a bit of weight.   Did get the compression boots and wears them daily and has had good results with this. Says that she has [facility] cardiology appointment scheduled sometime next month.   She is drinking 3L of fluids daily because when she drinks the 2L / day, she ends up hypotensive and then in the ED or admitted. Says that she's not adding any salt to her food and that her daughter does all the cooking.   Past Medical History:  Diagnosis Date   ADD (attention deficit disorder)    Anxiety    Arthritis    knees, shoulder, upper back   Asthma    CFS (chronic fatigue syndrome)    Chewing difficulty    CHF (congestive heart failure) (HCC)    Chronic kidney disease    Constipation    Depression    Depression    Diabetes mellitus without complication (HCC)    Dyspnea    Environmental allergies    Fatty liver    Fibromyalgia    GERD (gastroesophageal reflux disease)    GI bleed 10/02/2018    Headache    migraines - 5x/mo   Hypertension    Hypokalemia 10/14/2017   Hypothyroidism    IBS (irritable bowel syndrome)    IDA (iron deficiency anemia) 02/23/2021   Joint pain    Lower extremity edema    Major depressive disorder, single episode    Migraine without aura and without status migrainosus, not intractable    Migraines    Motion sickness    ships   Osteoarthritis    Other specified disorders of thyroid    Sleep apnea    Stroke ([place])    no residual deficits   Swallowing difficulty    Thyroid nodule    bilateral and goiter   Vitamin D deficiency    Wears contact lenses    Wears dentures    partial upper   Past Surgical History:  Procedure Laterality Date   ABDOMINAL HYSTERECTOMY  2000's   ABDOMINAL HYSTERECTOMY     ABDOMINAL SURGERY     laparoscopy x4 with lysis of adhesions   [place] N/A 04/03/2017   Procedure: LAPAROSCOPIC CHOLECYSTECTOMY;  Surgeon: Olean Ree, MD;  Location: [facility] ORS;  Service: General;  Laterality: N/A;   COLONOSCOPY N/A 10/06/2018   Procedure: COLONOSCOPY;  Surgeon: Lin Landsman, MD;  Location: [facility] ENDOSCOPY;  Service: Gastroenterology;  Laterality: N/A;   COLONOSCOPY WITH PROPOFOL N/A 03/10/2017   Procedure: COLONOSCOPY WITH PROPOFOL;  Surgeon: Manya Silvas, MD;  Location: [facility] ENDOSCOPY;  Service: Endoscopy;  Laterality: N/A;   ECTOPIC PREGNANCY SURGERY  2000's   ESOPHAGOGASTRODUODENOSCOPY (EGD) WITH PROPOFOL N/A 03/10/2017   Procedure: ESOPHAGOGASTRODUODENOSCOPY (EGD) WITH PROPOFOL;  Surgeon: Manya Silvas, MD;  Location: [facility] ENDOSCOPY;  Service: Endoscopy;  Laterality: N/A;   HERNIA REPAIR     JOINT REPLACEMENT Right 12/16/12   knee- medial - makoplasty   KNEE ARTHROSCOPY Right 2006   partial medial and lateral meniscectomies   KNEE ARTHROSCOPY Right 10/06/2015   Procedure: RIGHT KNEE ARTHROSCOPY WITH DEBRIDEMENT;  Surgeon: Leanor Kail, MD;  Location: [place];  Service: Orthopedics;  Laterality: Right;  Diabetic - insulin and oral meds   ROTATOR CUFF REPAIR Left 2006   TUBAL LIGATION  2000's   Family History  Problem Relation Age of Onset   Hypertension Mother    Diabetes Mother    Heart disease Mother    Depression Mother    Obesity Mother    Hypertension Father    Diabetes Father    Allergies Father    Kidney disease Father    Cancer Father    Breast cancer Neg Hx    Social History   Tobacco Use   Smoking status: Never   Smokeless tobacco: Never  Substance Use Topics   Alcohol use: Not Currently   Allergies  Allergen Reactions   Penicillins Anaphylaxis    Has patient had a PCN reaction causing immediate rash, facial/tongue/throat swelling, SOB or lightheadedness with hypotension: Yes Has patient had a PCN reaction causing severe rash involving mucus membranes or skin necrosis: No Has patient had a PCN reaction that required hospitalization No Has patient had a PCN reaction occurring within the last 10 years: No If all of the above answers are "NO", then may proceed with Cephalosporin use.  Has patient had a PCN reaction causing immediate rash, facial/tongue/throat swelling, SOB or lightheadedness with hypotension: Yes Has patient had a PCN reaction causing severe rash involving mucus membranes or skin necrosis: No Has patient had a PCN reaction that required hospitalization No Has patient had a PCN reaction occurring within the last 10 years: No If all of the above answers are "NO", then may proceed with Cephalosporin use. Other reaction(s): Other (See Comments) Severe/almost died Other reaction(s): ANAPHYLAXIS Has patient had a PCN reaction causing immediate rash, facial/tongue/throat swelling, SOB or lightheadedness with hypotension: Yes Has patient had a PCN reaction causing severe rash involving mucus membranes or skin necrosis: No Has patient had a PCN reaction that required hospitalization No Has patient had a  PCN reaction occurring within the last 10 years: No If all of the above answers are "NO", then may proceed with Cephalosporin use. Other reaction(s): Other (See Comments) Severe/almost died Other reaction(s): ANAPHYLAXIS Has patient had a PCN reaction causing immediate rash, facial/tongue/throat swelling, SOB or lightheadedness with hypotension: Yes Has patient had a PCN reaction causing severe rash involving mucus membranes or skin necrosis: No Has patient had a PCN reaction that required hospitalization No Has patient had a PCN reaction occurring within the last 10 years: No If all of the above answers are "NO", then may proceed with Cephalospo... (TRUNCATED)   Penicillins Anaphylaxis   Ace Inhibitors Swelling   Ace Inhibitors Swelling   Prior to Admission medications   Medication Sig Start Date End Date Taking? Authorizing Provider  albuterol (VENTOLIN  HFA) 108 (90 Base) MCG/ACT inhaler Inhale 2 puffs into the lungs every 6 (six) hours as needed for wheezing or shortness of breath. 02/20/21  Yes Kasa, Maretta Bees, MD  amLODipine (NORVASC) 2.5 MG tablet Take 1 tablet by mouth daily. 02/08/22 02/08/23 Yes [provider]  Ascorbic Acid (VITAMIN C CR) 1000 MG TBCR Take 1 tablet by mouth daily.   Yes [provider]  B-D UF III MINI PEN NEEDLES 31G X 5 MM MISC Inject into the skin daily. 03/18/20  Yes [provider]  BD PEN NEEDLE MICRO U/F 32G X 6 MM MISC Inject into the skin. 06/14/20  Yes [provider]  BELSOMRA 10 MG TABS Take 1 tablet by mouth at bedtime. 01/25/22  Yes [provider]  buprenorphine (BUTRANS) 10 MCG/HR PTWK 1 patch once a week. 03/29/21  Yes [provider]  desvenlafaxine (PRISTIQ) 50 MG 24 hr tablet Take 50 mg by mouth daily. 01/28/21  Yes [provider]  Dexmethylphenidate HCl 25 MG CP24 Take 1 capsule by mouth every morning. 05/09/21  Yes [provider]  estradiol (ESTRACE) 0.1 MG/GM vaginal cream  10/15/19   Yes [provider]  furosemide (LASIX) 40 MG tablet Take 1 tablet (40 mg total) by mouth 2 (two) times daily. 01/18/22  Yes Shawnta Schlegel A, FNP  HUMALOG KWIKPEN 100 UNIT/ML KwikPen Inject into the skin. 04/24/21  Yes [provider]  hydrocortisone 2.5 % ointment Apply topically 2 (two) times daily. 01/23/21  Yes [provider]  hydrOXYzine (ATARAX/VISTARIL) 25 MG tablet Take 25 mg by mouth daily. 04/09/21  Yes [provider]  Insulin Glargine (TOUJEO SOLOSTAR [place]) Inject 62 Units into the skin every morning.   Yes [provider]  Insulin Pen Needle 31G X 6 MM MISC SMARTSIG:1 Needle SUB-Q 4 Times Daily 05/19/20  Yes [provider]  JARDIANCE 25 MG TABS tablet Take 25 mg by mouth daily. 03/18/20  Yes [provider]  LINZESS 290 MCG CAPS capsule Take 290 mcg by mouth daily. 04/12/20  Yes [provider]  LORazepam (ATIVAN) 1 MG tablet Take 1 mg by mouth 2 (two) times daily as needed for anxiety. 09/20/20  Yes [provider]  mirtazapine (REMERON SOL-TAB) 30 MG disintegrating tablet Take 30 mg by mouth daily as needed. 03/18/18  Yes [provider]  montelukast (SINGULAIR) 10 MG tablet Take 10 mg by mouth daily. 04/12/20  Yes [provider]  omeprazole (PRILOSEC) 40 MG capsule Take 40 mg by mouth daily. 04/12/20  Yes [provider]  orlistat (XENICAL) 120 MG capsule Take 1 capsule by mouth 3 (three) times daily. 01/10/22  Yes [provider]  potassium chloride SA (KLOR-CON) 20 MEQ tablet Take 1 tablet (20 mEq total) by mouth daily. 05/25/21  Yes Meghana Tullo, Otila Kluver A, FNP  pravastatin (PRAVACHOL) 40 MG tablet Take 40 mg by mouth daily. 04/12/20  Yes [provider]  pregabalin (LYRICA) 150 MG capsule Take 150 mg by mouth 3 (three) times daily. 04/12/20  Yes [provider]  PROAIR RESPICLICK 627 (90 Base) MCG/ACT AEPB Inhale 2 puffs into the lungs every 4 (four) hours as needed.  11/12/19  Yes [provider]  propranolol ER (INDERAL LA) 80 MG 24 hr capsule Take 1 capsule (80 mg total) by mouth daily. 05/25/21  Yes Kade Rickels A, FNP  Semaglutide, 1 MG/DOSE, (OZEMPIC, 1 MG/DOSE,) 2 MG/1.5ML SOPN Inject 2 mg into the skin once a week. On Fridays   Yes [provider]  SUMAtriptan (IMITREX) 50 MG tablet Take 1 tablet (50 mg total) by mouth every 2 (two) hours as needed for migraine. May repeat in 2 hours if headache persists or recurs.  Not to exceed 4 pills in a 24-hour period.  If you find you need more medication and you have taken 4 pills, please go directly to the emergency room or call 911. 11/13/20  Yes Verda Cumins, MD  tiotropium (SPIRIVA HANDIHALER) 18 MCG inhalation capsule Place 1 capsule (18 mcg total) into inhaler and inhale daily. 02/20/21  Yes Flora Lipps, MD  tiZANidine (ZANAFLEX) 4 MG tablet Take 4 mg by mouth every 8 (eight) hours as needed for muscle spasms.    Yes [provider]  topiramate (TOPAMAX) 100 MG tablet Take 100 mg by mouth 2 (two) times daily. 04/12/20  Yes [provider]  TRUEPLUS PEN NEEDLES 31G X 6 MM [place]  05/22/20  Yes [provider]  VITAMIN D, CHOLECALCIFEROL, PO Take 2,000 Units by mouth daily.   Yes [provider]  VRAYLAR 6 MG CAPS Take 1 capsule by mouth daily. 05/09/21  Yes [provider]  flunisolide (NASALIDE) 25 MCG/ACT (0.025%) SOLN Place into the nose. Patient not taking: Reported on 05/27/2022    [provider]   Review of Systems  Constitutional:  Positive for fatigue (with minimal exertion). Negative for appetite change.  HENT:  Negative for congestion and postnasal drip.   Eyes: Negative.   Respiratory:  Positive for shortness of breath (with moderate exertion). Negative for cough, chest tightness and wheezing.   Cardiovascular:  Negative for chest pain, palpitations and leg swelling.  Gastrointestinal:  Positive for abdominal distention ("little  bloated"). Negative for abdominal pain.  Endocrine: Negative.   Genitourinary: Negative.   Musculoskeletal:  Positive for arthralgias (right knee). Negative for back pain.  Skin: Negative.   Allergic/Immunologic: Negative.   Neurological:  Positive for light-headedness (sometimes). Negative for dizziness.  Hematological:  Negative for adenopathy. Does not bruise/bleed easily.  Psychiatric/Behavioral:  Positive for sleep disturbance (sleeping on 2 pillows). Negative for dysphoric mood. The patient is not nervous/anxious.    Vitals:   05/27/22 1002  BP: (!) 143/87  Pulse: 76  Resp: 20  SpO2: 100%  Weight: 205 lb 8 oz (93.2 kg)  Height: 5\' 5"  (1.651 m)   Wt Readings from Last 3 Encounters:  05/27/22 205 lb 8 oz (93.2 kg)  02/27/22 219 lb 8 oz (99.6 kg)  02/05/22 214 lb 4.8 oz (97.2 kg)   Lab Results  Component Value Date   CREATININE 0.91 05/24/2021   CREATININE 0.98 02/22/2021   CREATININE 0.86 09/24/2020   Physical Exam Vitals and nursing note reviewed.  Constitutional:      Appearance: She is well-developed.  HENT:     Head: Normocephalic and atraumatic.  Neck:     Thyroid: No thyromegaly.  Cardiovascular:     Rate and Rhythm: Normal rate and regular rhythm.  Pulmonary:     Effort: Pulmonary effort is normal. No respiratory distress.     Breath sounds: No wheezing or rales.  Abdominal:     General: There is no distension.     Palpations: Abdomen is soft.  Musculoskeletal:        General: No tenderness.     Cervical back: Normal range of motion and neck supple.     Right lower leg: No tenderness. Edema (trace pitting) present.     Left lower leg: No tenderness. Edema (trace pitting)  present.  Skin:    General: Skin is warm and dry.  Neurological:     General: No focal deficit present.     Mental Status: She is alert and oriented to person, place, and time.  Psychiatric:        Mood and Affect: Mood normal.        Behavior: Behavior normal.        Thought  Content: Thought content normal.    Assessment & Plan:  1: Chronic heart failure with preserved ejection fraction without structural changes- - NYHA class III - euvolemic today - not weighing daily but she does have scales; instructed to resume daily weighing so that she can call for an overnight weight gain of >2 pounds or a weekly weight gain of >5 pounds - weight down 14 pounds from last visit here 3 months ago - now on ozempic and has been lifting weights - followed with cardiology Humphrey Rolls) in the past but had been released from his practice due to NS;she says that she has [facility] cardiology appointment scheduled next month - NS for echo on 03/25/22; this has been rescheduled for 06/19/22; emphasized that she keep this appointment or cancel it if she can't make it - not adding salt and trying to follow a low sodium diet; says that her daughter does all the cooking; denies eating out much - drinking ~ 3L of water daily as she ends up hypotensive if she only drinks 2L  - BNP 09/12/20 was 64.0   2: HTN- - BP mildly elevated (143/87) - saw PCP Clide Deutscher at [facility]) 01/15/22; returns in a few weeks - BMP 02/08/22 reviewed and showed sodium 141, potassium 4.4, creatinine 0.96 and GFR >60  3: DM- - A1c 05/03/22 was 7.1% - glucose at home today was 64 - saw endocrinology ([place]) 05/03/22 - saw nephrology Percell Miller) 02/08/22 - using ozempic  4: Asthma- - saw pulmonology Humphrey Rolls) 05/08/21  5: Lymphedema- - stage 2 - wearing compression boots daily with improvement of pedal edema - she is asking about abdominal compression but explained that I don't order that; she will have to talk with cardiology about this - abdomen is soft today   Patient did not bring her medications nor a list. Each medication was verbally reviewed with the patient and she was encouraged to bring the bottles to every visit to confirm accuracy of list.  Return in 3 months, sooner if needed

## 2022-05-27 ENCOUNTER — Encounter: Payer: Self-pay | Admitting: Family

## 2022-05-27 ENCOUNTER — Ambulatory Visit: Payer: Medicare Other | Attending: Family | Admitting: Family

## 2022-05-27 VITALS — BP 143/87 | HR 76 | Resp 20 | Ht 65.0 in | Wt 205.5 lb

## 2022-05-27 DIAGNOSIS — I10 Essential (primary) hypertension: Secondary | ICD-10-CM

## 2022-05-27 DIAGNOSIS — R0602 Shortness of breath: Secondary | ICD-10-CM | POA: Diagnosis not present

## 2022-05-27 DIAGNOSIS — E1122 Type 2 diabetes mellitus with diabetic chronic kidney disease: Secondary | ICD-10-CM | POA: Insufficient documentation

## 2022-05-27 DIAGNOSIS — N189 Chronic kidney disease, unspecified: Secondary | ICD-10-CM | POA: Diagnosis not present

## 2022-05-27 DIAGNOSIS — M797 Fibromyalgia: Secondary | ICD-10-CM | POA: Insufficient documentation

## 2022-05-27 DIAGNOSIS — I5032 Chronic diastolic (congestive) heart failure: Secondary | ICD-10-CM

## 2022-05-27 DIAGNOSIS — Z7985 Long-term (current) use of injectable non-insulin antidiabetic drugs: Secondary | ICD-10-CM | POA: Diagnosis not present

## 2022-05-27 DIAGNOSIS — K219 Gastro-esophageal reflux disease without esophagitis: Secondary | ICD-10-CM | POA: Insufficient documentation

## 2022-05-27 DIAGNOSIS — J4540 Moderate persistent asthma, uncomplicated: Secondary | ICD-10-CM | POA: Diagnosis not present

## 2022-05-27 DIAGNOSIS — Z794 Long term (current) use of insulin: Secondary | ICD-10-CM

## 2022-05-27 DIAGNOSIS — G4733 Obstructive sleep apnea (adult) (pediatric): Secondary | ICD-10-CM | POA: Insufficient documentation

## 2022-05-27 DIAGNOSIS — I890 Lymphedema, not elsewhere classified: Secondary | ICD-10-CM | POA: Diagnosis not present

## 2022-05-27 DIAGNOSIS — Z8673 Personal history of transient ischemic attack (TIA), and cerebral infarction without residual deficits: Secondary | ICD-10-CM | POA: Diagnosis not present

## 2022-05-27 DIAGNOSIS — J45909 Unspecified asthma, uncomplicated: Secondary | ICD-10-CM | POA: Diagnosis not present

## 2022-05-27 DIAGNOSIS — E119 Type 2 diabetes mellitus without complications: Secondary | ICD-10-CM | POA: Diagnosis not present

## 2022-05-27 DIAGNOSIS — I130 Hypertensive heart and chronic kidney disease with heart failure and stage 1 through stage 4 chronic kidney disease, or unspecified chronic kidney disease: Secondary | ICD-10-CM | POA: Diagnosis not present

## 2022-05-27 NOTE — Patient Instructions (Signed)
Continue weighing daily and call for an overnight weight gain of 3 pounds or more or a weekly weight gain of more than 5 pounds.   If you have voicemail, please make sure your mailbox is cleaned out so that we may leave a message and please make sure to listen to any voicemails.    Please make sure you show up for your echo (heart ultrasound). If you can't make it, please call and cancel it.

## 2022-05-30 ENCOUNTER — Encounter: Payer: Self-pay | Admitting: Oncology

## 2022-06-06 ENCOUNTER — Encounter (INDEPENDENT_AMBULATORY_CARE_PROVIDER_SITE_OTHER): Payer: Medicare Other | Admitting: Internal Medicine

## 2022-06-11 ENCOUNTER — Encounter (INDEPENDENT_AMBULATORY_CARE_PROVIDER_SITE_OTHER): Payer: Self-pay | Admitting: Internal Medicine

## 2022-06-11 DIAGNOSIS — Z0289 Encounter for other administrative examinations: Secondary | ICD-10-CM

## 2022-06-19 ENCOUNTER — Ambulatory Visit: Payer: Medicare Other

## 2022-07-11 ENCOUNTER — Encounter: Payer: Self-pay | Admitting: Oncology

## 2022-07-11 ENCOUNTER — Ambulatory Visit: Payer: Medicare Other

## 2022-08-05 MED FILL — Iron Sucrose Inj 20 MG/ML (Fe Equiv): INTRAVENOUS | Qty: 10 | Status: AC

## 2022-08-06 ENCOUNTER — Encounter: Payer: Self-pay | Admitting: Nurse Practitioner

## 2022-08-06 ENCOUNTER — Inpatient Hospital Stay: Payer: Medicare Other

## 2022-08-06 ENCOUNTER — Inpatient Hospital Stay (HOSPITAL_BASED_OUTPATIENT_CLINIC_OR_DEPARTMENT_OTHER): Payer: Medicare Other | Admitting: Nurse Practitioner

## 2022-08-06 ENCOUNTER — Inpatient Hospital Stay: Payer: Medicare Other | Attending: Oncology

## 2022-08-06 VITALS — BP 141/83 | HR 96 | Temp 99.0°F | Wt 209.0 lb

## 2022-08-06 DIAGNOSIS — Z8673 Personal history of transient ischemic attack (TIA), and cerebral infarction without residual deficits: Secondary | ICD-10-CM | POA: Diagnosis not present

## 2022-08-06 DIAGNOSIS — Z794 Long term (current) use of insulin: Secondary | ICD-10-CM | POA: Diagnosis not present

## 2022-08-06 DIAGNOSIS — D509 Iron deficiency anemia, unspecified: Secondary | ICD-10-CM | POA: Diagnosis not present

## 2022-08-06 DIAGNOSIS — K589 Irritable bowel syndrome without diarrhea: Secondary | ICD-10-CM | POA: Insufficient documentation

## 2022-08-06 DIAGNOSIS — F5089 Other specified eating disorder: Secondary | ICD-10-CM | POA: Diagnosis not present

## 2022-08-06 DIAGNOSIS — K760 Fatty (change of) liver, not elsewhere classified: Secondary | ICD-10-CM | POA: Diagnosis not present

## 2022-08-06 DIAGNOSIS — I509 Heart failure, unspecified: Secondary | ICD-10-CM | POA: Diagnosis not present

## 2022-08-06 DIAGNOSIS — F419 Anxiety disorder, unspecified: Secondary | ICD-10-CM | POA: Insufficient documentation

## 2022-08-06 DIAGNOSIS — M797 Fibromyalgia: Secondary | ICD-10-CM | POA: Insufficient documentation

## 2022-08-06 DIAGNOSIS — G4733 Obstructive sleep apnea (adult) (pediatric): Secondary | ICD-10-CM | POA: Diagnosis not present

## 2022-08-06 DIAGNOSIS — G40909 Epilepsy, unspecified, not intractable, without status epilepticus: Secondary | ICD-10-CM | POA: Insufficient documentation

## 2022-08-06 DIAGNOSIS — R5382 Chronic fatigue, unspecified: Secondary | ICD-10-CM

## 2022-08-06 DIAGNOSIS — E1122 Type 2 diabetes mellitus with diabetic chronic kidney disease: Secondary | ICD-10-CM | POA: Insufficient documentation

## 2022-08-06 DIAGNOSIS — J4489 Other specified chronic obstructive pulmonary disease: Secondary | ICD-10-CM | POA: Insufficient documentation

## 2022-08-06 DIAGNOSIS — E039 Hypothyroidism, unspecified: Secondary | ICD-10-CM | POA: Diagnosis not present

## 2022-08-06 DIAGNOSIS — R5383 Other fatigue: Secondary | ICD-10-CM

## 2022-08-06 DIAGNOSIS — I130 Hypertensive heart and chronic kidney disease with heart failure and stage 1 through stage 4 chronic kidney disease, or unspecified chronic kidney disease: Secondary | ICD-10-CM | POA: Insufficient documentation

## 2022-08-06 DIAGNOSIS — K219 Gastro-esophageal reflux disease without esophagitis: Secondary | ICD-10-CM | POA: Diagnosis not present

## 2022-08-06 DIAGNOSIS — M1990 Unspecified osteoarthritis, unspecified site: Secondary | ICD-10-CM | POA: Insufficient documentation

## 2022-08-06 DIAGNOSIS — Z79899 Other long term (current) drug therapy: Secondary | ICD-10-CM | POA: Insufficient documentation

## 2022-08-06 LAB — CBC
HCT: 44.2 % (ref 36.0–46.0)
Hemoglobin: 14.4 g/dL (ref 12.0–15.0)
MCH: 28.6 pg (ref 26.0–34.0)
MCHC: 32.6 g/dL (ref 30.0–36.0)
MCV: 87.9 fL (ref 80.0–100.0)
Platelets: 336 10*3/uL (ref 150–400)
RBC: 5.03 MIL/uL (ref 3.87–5.11)
RDW: 17 % — ABNORMAL HIGH (ref 11.5–15.5)
WBC: 6 10*3/uL (ref 4.0–10.5)
nRBC: 0 % (ref 0.0–0.2)

## 2022-08-06 LAB — IRON AND TIBC
Iron: 111 ug/dL (ref 28–170)
Saturation Ratios: 33 % — ABNORMAL HIGH (ref 10.4–31.8)
TIBC: 340 ug/dL (ref 250–450)
UIBC: 229 ug/dL

## 2022-08-06 LAB — FERRITIN: Ferritin: 167 ng/mL (ref 11–307)

## 2022-08-06 NOTE — Progress Notes (Signed)
Hematology Progress Note [place]  Patient Care Team: Donnie Coffin, MD as PCP - General (Family Medicine) Alisa Graff, FNP as Nurse Practitioner (Family Medicine) Dionisio David, MD as Consulting Physician (Cardiology) Cathi Roan, [facility] (Inactive) as Pharmacist (Pharmacist) Pleasant, Eppie Gibson, RN as [place] Management  CHIEF COMPLAINTS/PURPOSE OF CONSULTATION:  Anemia   HISTORY OF PRESENTING ILLNESS:  Amanda [name] 56 y.o. female is here because of low ferritin levels.  Her hemoglobin has been normal- baseline is around 12.5-13.  Most recent ferritin is from 12/14/2020 and was 34, TIBC 455 and iron saturation 12%.  She has a past medical history significant for hypertension, heart failure, sleep apnea, asthma, COPD, OSA, epilepsy, type 2 diabetes, osteoarthritis, acute renal failure and depression.  Patient is followed by Dr. Mortimer Fries for asthma and OSA.  Currently on Breo, Spiriva and albuterol.  She has a CPAP machine she uses nightly.  She had a colonoscopy in 2020 which was negative and a EGD/colonoscopy back in 2018 which showed gastritis, normal esophagus and duodenum.  She had a few internal hemorrhoids and 1 polyp found in the sigmoid colon.  Reports overall feeling fatigued and poorly.  Has ice pica prompting evaluation.  She has tried oral iron in liquid and tablet with poor toleration.  Both caused severe abdominal cramping, diarrhea nausea and vomiting.  Reports no active bleeding and no longer has menstrual cycles.  Reports 2 C-sections and hernia repair surgery with scar tissue.  Eats a fairly normal diet.  Denies a family history of hematology blood disorders.  Reports that her daughter is also iron deficient.  She gets iron infusions here at the cancer center.  Interval History- Patient is 56 year old female who returns to clinic for re-evaluation and consideration of IV iron. She has ongoing fatigue which is unchanged. Says she  craves ice. Fatigue doesn't improve with rest. Has cramps intermittently. Denies any neurologic complaints. Denies recent fevers or illnesses. Denies any easy bleeding or bruising. No melena or hematochezia.  Reports good appetite and denies weight loss. Denies chest pain. Denies any nausea, vomiting, constipation, or diarrhea. Denies urinary complaints. Patient offers no further specific complaints today.  MEDICAL HISTORY:  Past Medical History:  Diagnosis Date   ADD (attention deficit disorder)    Anxiety    Arthritis    knees, shoulder, upper back   Asthma    CFS (chronic fatigue syndrome)    Chewing difficulty    CHF (congestive heart failure) (HCC)    Chronic kidney disease    Constipation    Depression    Depression    Diabetes mellitus without complication (HCC)    Dyspnea    Environmental allergies    Fatty liver    Fibromyalgia    GERD (gastroesophageal reflux disease)    GI bleed 10/02/2018   Headache    migraines - 5x/mo   Hypertension    Hypokalemia 10/14/2017   Hypothyroidism    IBS (irritable bowel syndrome)    IDA (iron deficiency anemia) 02/23/2021   Joint pain    Lower extremity edema    Major depressive disorder, single episode    Migraine without aura and without status migrainosus, not intractable    Migraines    Motion sickness    ships   Osteoarthritis    Other specified disorders of thyroid    Sleep apnea    Stroke ([place])    no residual deficits   Swallowing difficulty  Thyroid nodule    bilateral and goiter   Vitamin D deficiency    Wears contact lenses    Wears dentures    partial upper    SURGICAL HISTORY: Past Surgical History:  Procedure Laterality Date   ABDOMINAL HYSTERECTOMY  2000's   ABDOMINAL HYSTERECTOMY     ABDOMINAL SURGERY     laparoscopy x4 with lysis of adhesions   APPENDECTOMY  1986   CESAREAN SECTION  1992   CHOLECYSTECTOMY N/A 04/03/2017   Procedure: LAPAROSCOPIC CHOLECYSTECTOMY;  Surgeon: Olean Ree, MD;   Location: [facility] ORS;  Service: General;  Laterality: N/A;   COLONOSCOPY N/A 10/06/2018   Procedure: COLONOSCOPY;  Surgeon: Lin Landsman, MD;  Location: [facility] ENDOSCOPY;  Service: Gastroenterology;  Laterality: N/A;   COLONOSCOPY WITH PROPOFOL N/A 03/10/2017   Procedure: COLONOSCOPY WITH PROPOFOL;  Surgeon: Manya Silvas, MD;  Location: [facility] ENDOSCOPY;  Service: Endoscopy;  Laterality: N/A;   ECTOPIC PREGNANCY SURGERY  2000's   ESOPHAGOGASTRODUODENOSCOPY (EGD) WITH PROPOFOL N/A 03/10/2017   Procedure: ESOPHAGOGASTRODUODENOSCOPY (EGD) WITH PROPOFOL;  Surgeon: Manya Silvas, MD;  Location: [facility] ENDOSCOPY;  Service: Endoscopy;  Laterality: N/A;   HERNIA REPAIR     JOINT REPLACEMENT Right 12/16/12   knee- medial - makoplasty   KNEE ARTHROSCOPY Right 2006   partial medial and lateral meniscectomies   KNEE ARTHROSCOPY Right 10/06/2015   Procedure: RIGHT KNEE ARTHROSCOPY WITH DEBRIDEMENT;  Surgeon: Leanor Kail, MD;  Location: [facility];  Service: Orthopedics;  Laterality: Right;  Diabetic - insulin and oral meds   ROTATOR CUFF REPAIR Left 2006   TUBAL LIGATION  2000's    SOCIAL HISTORY: Social History   Socioeconomic History   Marital status: Widowed    Spouse name: Celecia Hohlt   Number of children: Not on file   Years of education: college   Highest education level: Not on file  Occupational History   Occupation: Accountant  Tobacco Use   Smoking status: Never   Smokeless tobacco: Never  Vaping Use   Vaping Use: Never used  Substance and Sexual Activity   Alcohol use: Not Currently   Drug use: Never   Sexual activity: Not Currently    Birth control/protection: Surgical  Other Topics Concern   Not on file  Social History Narrative   ** Merged History Encounter **       Lives at home with son (who is mentally ill)   Ambulates independently.   Husband Elenore Rota passed in June 2021   Daughter TT   Social Determinants of Health   Financial Resource Strain: Low  Risk  (11/16/2020)   Overall Financial Resource Strain (CARDIA)    Difficulty of Paying Living Expenses: Not very hard  Food Insecurity: No Food Insecurity (11/16/2020)   Hunger Vital Sign    Worried About Running Out of Food in the Last Year: Never true    [place] in the Last Year: Never true  Transportation Needs: No Transportation Needs (11/16/2020)   PRAPARE - Hydrologist (Medical): No    Lack of Transportation (Non-Medical): No  Physical Activity: Not on file  Stress: No Stress Concern Present (07/07/2020)   [place]    Feeling of Stress : Only a little  Social Connections: Moderately Integrated (10/16/2020)   Social Connection and Isolation Panel [NHANES]    Frequency of Communication with Friends and Family: More than three times a week    Frequency  of Social Gatherings with Friends and Family: More than three times a week    Attends Religious Services: 1 to 4 times per year    Active Member of [employer] or Organizations: Yes    Attends Archivist Meetings: 1 to 4 times per year    Marital Status: Widowed  Intimate Partner Violence: Not At Risk (10/16/2020)   Humiliation, Afraid, Rape, and Kick questionnaire    Fear of Current or Ex-Partner: No    Emotionally Abused: No    Physically Abused: No    Sexually Abused: No    FAMILY HISTORY: Family History  Problem Relation Age of Onset   Hypertension Mother    Diabetes Mother    Heart disease Mother    Depression Mother    Obesity Mother    Hypertension Father    Diabetes Father    Allergies Father    Kidney disease Father    Cancer Father    Breast cancer Neg Hx     ALLERGIES:  is allergic to penicillins, penicillins, ace inhibitors, and ace inhibitors.  MEDICATIONS:  Current Outpatient Medications  Medication Sig Dispense Refill   albuterol (VENTOLIN HFA) 108 (90 Base) MCG/ACT inhaler Inhale 2 puffs into the  lungs every 6 (six) hours as needed for wheezing or shortness of breath. 8 g 6   amLODipine (NORVASC) 2.5 MG tablet Take 1 tablet by mouth daily.     Ascorbic Acid (VITAMIN C CR) 1000 MG TBCR Take 1 tablet by mouth daily.     B-D UF III MINI PEN NEEDLES 31G X 5 MM MISC Inject into the skin daily.     BD PEN NEEDLE MICRO U/F 32G X 6 MM MISC Inject into the skin.     BELSOMRA 10 MG TABS Take 1 tablet by mouth at bedtime.     buprenorphine (BUTRANS) 10 MCG/HR PTWK 1 patch once a week.     desvenlafaxine (PRISTIQ) 50 MG 24 hr tablet Take 50 mg by mouth daily.     Dexmethylphenidate HCl 25 MG CP24 Take 1 capsule by mouth every morning.     estradiol (ESTRACE) 0.1 MG/GM vaginal cream      flunisolide (NASALIDE) 25 MCG/ACT (0.025%) SOLN Place into the nose.     furosemide (LASIX) 40 MG tablet Take 1 tablet (40 mg total) by mouth 2 (two) times daily. 180 tablet 3   HUMALOG KWIKPEN 100 UNIT/ML KwikPen Inject into the skin.     hydrocortisone 2.5 % ointment Apply topically 2 (two) times daily.     hydrOXYzine (ATARAX/VISTARIL) 25 MG tablet Take 25 mg by mouth daily.     Insulin Glargine (TOUJEO SOLOSTAR [place]) Inject 62 Units into the skin every morning.     Insulin Pen Needle 31G X 6 MM MISC SMARTSIG:1 Needle SUB-Q 4 Times Daily     JARDIANCE 25 MG TABS tablet Take 25 mg by mouth daily.     LINZESS 290 MCG CAPS capsule Take 290 mcg by mouth daily.     LORazepam (ATIVAN) 1 MG tablet Take 1 mg by mouth 2 (two) times daily as needed for anxiety.     mirtazapine (REMERON SOL-TAB) 30 MG disintegrating tablet Take 30 mg by mouth daily as needed.  2   montelukast (SINGULAIR) 10 MG tablet Take 10 mg by mouth daily.     omeprazole (PRILOSEC) 40 MG capsule Take 40 mg by mouth daily.     orlistat (XENICAL) 120 MG capsule Take 1 capsule by mouth 3 (three)  times daily.     potassium chloride SA (KLOR-CON) 20 MEQ tablet Take 1 tablet (20 mEq total) by mouth daily. 90 tablet 3   pravastatin (PRAVACHOL) 40 MG tablet  Take 40 mg by mouth daily.     pregabalin (LYRICA) 150 MG capsule Take 150 mg by mouth 3 (three) times daily.     PROAIR RESPICLICK 123XX123 (90 Base) MCG/ACT AEPB Inhale 2 puffs into the lungs every 4 (four) hours as needed.     propranolol ER (INDERAL LA) 80 MG 24 hr capsule Take 1 capsule (80 mg total) by mouth daily. 90 capsule 3   Semaglutide, 1 MG/DOSE, (OZEMPIC, 1 MG/DOSE,) 2 MG/1.5ML SOPN Inject 2 mg into the skin once a week. On Fridays     SUMAtriptan (IMITREX) 50 MG tablet Take 1 tablet (50 mg total) by mouth every 2 (two) hours as needed for migraine. May repeat in 2 hours if headache persists or recurs.  Not to exceed 4 pills in a 24-hour period.  If you find you need more medication and you have taken 4 pills, please go directly to the emergency room or call 911. 10 tablet 0   tiotropium (SPIRIVA HANDIHALER) 18 MCG inhalation capsule Place 1 capsule (18 mcg total) into inhaler and inhale daily. 30 capsule 6   tiZANidine (ZANAFLEX) 4 MG tablet Take 4 mg by mouth every 8 (eight) hours as needed for muscle spasms.      topiramate (TOPAMAX) 100 MG tablet Take 100 mg by mouth 2 (two) times daily.     TRUEPLUS PEN NEEDLES 31G X 6 MM MISC      VITAMIN D, CHOLECALCIFEROL, PO Take 2,000 Units by mouth daily.     VRAYLAR 6 MG CAPS Take 1 capsule by mouth daily.     No current facility-administered medications for this visit.    REVIEW OF SYSTEMS:   Review of Systems  Constitutional:  Positive for malaise/fatigue. Negative for chills, diaphoresis, fever and weight loss.  HENT:  Negative for congestion, ear discharge, ear pain, nosebleeds, sinus pain and sore throat.   Eyes:  Negative for pain and redness.  Respiratory:  Negative for cough, hemoptysis, sputum production, shortness of breath, wheezing and stridor.   Cardiovascular:  Negative for chest pain, palpitations and leg swelling.  Gastrointestinal:  Negative for abdominal pain, constipation, diarrhea, nausea and vomiting.   Musculoskeletal:  Negative for falls, joint pain and myalgias.  Skin:  Negative for rash.  Neurological:  Negative for dizziness, loss of consciousness, weakness and headaches.  Psychiatric/Behavioral:  Negative for depression. The patient is not nervous/anxious and does not have insomnia.   All other systems reviewed and are negative.   PHYSICAL EXAMINATION: ECOG PERFORMANCE STATUS: 1 - Symptomatic but completely ambulatory  Vitals:   08/06/22 1337  BP: (!) 141/83  Pulse: 96  Temp: 99 F (37.2 C)   Filed Weights   08/06/22 1337  Weight: 209 lb (94.8 kg)    Physical Exam Constitutional:      Appearance: Normal appearance. She is obese.  HENT:     Head: Normocephalic and atraumatic.  Cardiovascular:     Rate and Rhythm: Normal rate and regular rhythm.  Pulmonary:     Effort: Pulmonary effort is normal.  Abdominal:     General: Bowel sounds are normal. There is no distension.     Palpations: Abdomen is soft.     Tenderness: There is no abdominal tenderness.  Skin:    General: Skin is warm and dry.  Coloration: Skin is not pale.  Neurological:     Mental Status: She is alert and oriented to person, place, and time.  Psychiatric:        Mood and Affect: Mood normal.        Behavior: Behavior normal.    LABORATORY DATA:  I have reviewed the data as listed Recent Results (from the past 2160 hour(s))  CBC     Status: Abnormal   Collection Time: 08/06/22  1:20 PM  Result Value Ref Range   WBC 6.0 4.0 - 10.5 K/uL   RBC 5.03 3.87 - 5.11 MIL/uL   Hemoglobin 14.4 12.0 - 15.0 g/dL   HCT 42.6 83.4 - 19.6 %   MCV 87.9 80.0 - 100.0 fL   MCH 28.6 26.0 - 34.0 pg   MCHC 32.6 30.0 - 36.0 g/dL   RDW 22.2 (H) 97.9 - 89.2 %   Platelets 336 150 - 400 K/uL   nRBC 0.0 0.0 - 0.2 %    Comment: Performed at [facility], [address] [place], [place] [address]   Iron/TIBC/Ferritin/ %Sat    Component Value Date/Time   IRON 111 08/06/2022 1320   IRON 56 12/14/2020  1357   TIBC 340 08/06/2022 1320   TIBC 455 (H) 12/14/2020 1357   FERRITIN 167 08/06/2022 1320   FERRITIN 34 12/14/2020 1357   IRONPCTSAT 33 (H) 08/06/2022 1320   IRONPCTSAT 12 (L) 12/14/2020 1357    RADIOGRAPHIC STUDIES: I have personally reviewed the radiological images as listed and agreed with the findings in the report. No results found.  ASSESSMENT/PLAN:  Low iron levels: EGD & Colonoscopy in 2018 showed gastritis and small hemorrhoids. Repeat colonoscopy in 2020 revealed hemorrhoids with stigmata of bleeding. No masses. Unable to tolerate oral iron. Last received IV iron in July 2022 for ferritin 33 and iron sat 11%. Today, her hemoglobin remains normal with hemoglobin 14.4. Normocytic. Ferritin is robust at 167. No additional iron at this time.   Fatigue: recommend she follow up with pcp for further evaluation and management.   Disposition- No iv iron today. 6 mo- labs (cbc, cmp, ferritin, iron studies) Day to week later see NP or PA, +/- venofer - la   No problem-specific Assessment & Plan notes found for this encounter.  I spent 25 minutes dedicated to the care of this patient (face-to-face and non-face-to-face) on the date of the encounter to include what is described in the assessment and plan.    Alinda Dooms, NP 08/06/22

## 2022-08-09 ENCOUNTER — Encounter: Payer: Self-pay | Admitting: Oncology

## 2022-08-14 ENCOUNTER — Ambulatory Visit: Payer: Medicare Other | Admitting: Family

## 2022-09-05 ENCOUNTER — Encounter: Payer: Self-pay | Admitting: Oncology

## 2022-09-12 ENCOUNTER — Ambulatory Visit: Payer: Medicaid Other

## 2023-01-05 ENCOUNTER — Encounter: Payer: Self-pay | Admitting: Oncology

## 2023-01-30 ENCOUNTER — Other Ambulatory Visit: Payer: Self-pay | Admitting: Family Medicine

## 2023-01-30 DIAGNOSIS — S060X9A Concussion with loss of consciousness of unspecified duration, initial encounter: Secondary | ICD-10-CM

## 2023-02-03 ENCOUNTER — Other Ambulatory Visit: Payer: Self-pay

## 2023-02-03 DIAGNOSIS — D509 Iron deficiency anemia, unspecified: Secondary | ICD-10-CM

## 2023-02-03 DIAGNOSIS — R5382 Chronic fatigue, unspecified: Secondary | ICD-10-CM

## 2023-02-04 ENCOUNTER — Inpatient Hospital Stay: Payer: Medicare HMO | Attending: Family Medicine

## 2023-02-04 ENCOUNTER — Encounter: Payer: Self-pay | Admitting: Oncology

## 2023-02-06 MED FILL — Iron Sucrose Inj 20 MG/ML (Fe Equiv): INTRAVENOUS | Qty: 10 | Status: AC

## 2023-02-07 ENCOUNTER — Inpatient Hospital Stay: Payer: Medicare HMO | Admitting: Medical Oncology

## 2023-02-07 ENCOUNTER — Inpatient Hospital Stay: Payer: Medicare HMO

## 2023-02-10 ENCOUNTER — Encounter: Payer: Self-pay | Admitting: Oncology

## 2023-02-12 ENCOUNTER — Ambulatory Visit: Admission: RE | Admit: 2023-02-12 | Payer: Medicare HMO | Source: Ambulatory Visit

## 2023-02-17 ENCOUNTER — Ambulatory Visit: Payer: Medicare HMO

## 2023-03-13 MED FILL — Iron Sucrose Inj 20 MG/ML (Fe Equiv): INTRAVENOUS | Qty: 10 | Status: AC

## 2023-03-14 ENCOUNTER — Inpatient Hospital Stay: Payer: Medicare HMO | Attending: Family Medicine | Admitting: Medical Oncology

## 2023-03-14 ENCOUNTER — Inpatient Hospital Stay: Payer: Medicare HMO

## 2023-10-05 ENCOUNTER — Other Ambulatory Visit: Payer: Self-pay

## 2023-10-05 ENCOUNTER — Encounter: Payer: Self-pay | Admitting: Oncology

## 2023-10-05 DIAGNOSIS — Z20822 Contact with and (suspected) exposure to covid-19: Secondary | ICD-10-CM | POA: Insufficient documentation

## 2023-10-05 DIAGNOSIS — E1122 Type 2 diabetes mellitus with diabetic chronic kidney disease: Secondary | ICD-10-CM | POA: Insufficient documentation

## 2023-10-05 DIAGNOSIS — J45909 Unspecified asthma, uncomplicated: Secondary | ICD-10-CM | POA: Insufficient documentation

## 2023-10-05 DIAGNOSIS — R059 Cough, unspecified: Secondary | ICD-10-CM | POA: Diagnosis not present

## 2023-10-05 DIAGNOSIS — D72819 Decreased white blood cell count, unspecified: Secondary | ICD-10-CM | POA: Diagnosis not present

## 2023-10-05 DIAGNOSIS — I130 Hypertensive heart and chronic kidney disease with heart failure and stage 1 through stage 4 chronic kidney disease, or unspecified chronic kidney disease: Secondary | ICD-10-CM | POA: Diagnosis not present

## 2023-10-05 DIAGNOSIS — Z96651 Presence of right artificial knee joint: Secondary | ICD-10-CM | POA: Insufficient documentation

## 2023-10-05 DIAGNOSIS — R112 Nausea with vomiting, unspecified: Secondary | ICD-10-CM | POA: Insufficient documentation

## 2023-10-05 DIAGNOSIS — R197 Diarrhea, unspecified: Secondary | ICD-10-CM | POA: Diagnosis not present

## 2023-10-05 DIAGNOSIS — R079 Chest pain, unspecified: Secondary | ICD-10-CM | POA: Diagnosis not present

## 2023-10-05 DIAGNOSIS — E039 Hypothyroidism, unspecified: Secondary | ICD-10-CM | POA: Insufficient documentation

## 2023-10-05 DIAGNOSIS — M545 Low back pain, unspecified: Secondary | ICD-10-CM | POA: Diagnosis not present

## 2023-10-05 DIAGNOSIS — I509 Heart failure, unspecified: Secondary | ICD-10-CM | POA: Insufficient documentation

## 2023-10-05 DIAGNOSIS — N189 Chronic kidney disease, unspecified: Secondary | ICD-10-CM | POA: Insufficient documentation

## 2023-10-05 DIAGNOSIS — Z794 Long term (current) use of insulin: Secondary | ICD-10-CM | POA: Insufficient documentation

## 2023-10-05 DIAGNOSIS — Z7984 Long term (current) use of oral hypoglycemic drugs: Secondary | ICD-10-CM | POA: Insufficient documentation

## 2023-10-05 LAB — CBC
HCT: 39.6 % (ref 36.0–46.0)
Hemoglobin: 12.7 g/dL (ref 12.0–15.0)
MCH: 28.7 pg (ref 26.0–34.0)
MCHC: 32.1 g/dL (ref 30.0–36.0)
MCV: 89.4 fL (ref 80.0–100.0)
Platelets: 223 10*3/uL (ref 150–400)
RBC: 4.43 MIL/uL (ref 3.87–5.11)
RDW: 14.6 % (ref 11.5–15.5)
WBC: 3.8 10*3/uL — ABNORMAL LOW (ref 4.0–10.5)
nRBC: 0 % (ref 0.0–0.2)

## 2023-10-05 LAB — COMPREHENSIVE METABOLIC PANEL
ALT: 22 U/L (ref 0–44)
AST: 19 U/L (ref 15–41)
Albumin: 3.4 g/dL — ABNORMAL LOW (ref 3.5–5.0)
Alkaline Phosphatase: 71 U/L (ref 38–126)
Anion gap: 10 (ref 5–15)
BUN: 13 mg/dL (ref 6–20)
CO2: 22 mmol/L (ref 22–32)
Calcium: 8.6 mg/dL — ABNORMAL LOW (ref 8.9–10.3)
Chloride: 103 mmol/L (ref 98–111)
Creatinine, Ser: 0.91 mg/dL (ref 0.44–1.00)
GFR, Estimated: >60 mL/min (ref >60)
Glucose, Bld: 207 mg/dL — ABNORMAL HIGH (ref 70–99)
Potassium: 3.7 mmol/L (ref 3.5–5.1)
Sodium: 135 mmol/L (ref 135–145)
Total Bilirubin: 0.4 mg/dL (ref 0.0–1.2)
Total Protein: 6.2 g/dL — ABNORMAL LOW (ref 6.5–8.1)

## 2023-10-05 LAB — RESP PANEL BY RT-PCR (RSV, FLU A&B, COVID)  RVPGX2
Influenza A by PCR: NEGATIVE
Influenza B by PCR: NEGATIVE
Resp Syncytial Virus by PCR: NEGATIVE
SARS Coronavirus 2 by RT PCR: NEGATIVE

## 2023-10-05 LAB — LIPASE, BLOOD: Lipase: 24 U/L (ref 11–51)

## 2023-10-05 NOTE — ED Triage Notes (Signed)
Pt sts that she has been having N/V/D with abd pain for the last several days.

## 2023-10-05 NOTE — ED Triage Notes (Signed)
Pt presents via EMS c/o "weakness" per EMS from home.  Reports fever over the last few days.   Reports sick contacts at home for Flu and Covid. Feels weak and sick per EMS.   EMS reports afebrile at this time.

## 2023-10-06 ENCOUNTER — Emergency Department
Admission: EM | Admit: 2023-10-06 | Discharge: 2023-10-06 | Disposition: A | Discharge status: Home / Self Care | Payer: 59 | Attending: Emergency Medicine | Admitting: Emergency Medicine

## 2023-10-06 ENCOUNTER — Emergency Department: Payer: 59

## 2023-10-06 DIAGNOSIS — R112 Nausea with vomiting, unspecified: Secondary | ICD-10-CM

## 2023-10-06 DIAGNOSIS — R6889 Other general symptoms and signs: Secondary | ICD-10-CM

## 2023-10-06 LAB — TROPONIN I (HIGH SENSITIVITY): Troponin I (High Sensitivity): <2 ng/L (ref <18)

## 2023-10-06 MED ORDER — MORPHINE SULFATE (PF) 4 MG/ML IV SOLN
4.0000 mg | Freq: Once | INTRAVENOUS | Status: AC
  Administered 2023-10-06: 4 mg via INTRAVENOUS
  Filled 2023-10-06: qty 1

## 2023-10-06 MED ORDER — ONDANSETRON 4 MG PO TBDP: 4.0000 mg | ORAL_TABLET | Freq: Four times a day (QID) | ORAL | 0 refills | Status: DC | PRN

## 2023-10-06 MED ORDER — KETOROLAC TROMETHAMINE 30 MG/ML IJ SOLN
30.0000 mg | Freq: Once | INTRAMUSCULAR | Status: AC
  Administered 2023-10-06: 30 mg via INTRAVENOUS
  Filled 2023-10-06: qty 1

## 2023-10-06 MED ORDER — SODIUM CHLORIDE 0.9 % IV BOLUS (SEPSIS)
1000.0000 mL | Freq: Once | INTRAVENOUS | Status: AC
  Administered 2023-10-06: 1000 mL via INTRAVENOUS

## 2023-10-06 MED ORDER — ONDANSETRON HCL 4 MG/2ML IJ SOLN
4.0000 mg | Freq: Once | INTRAMUSCULAR | Status: AC
  Administered 2023-10-06: 4 mg via INTRAVENOUS
  Filled 2023-10-06: qty 2

## 2023-10-06 NOTE — Discharge Instructions (Signed)
 You may alternate Tylenol 1000 mg every 6 hours as needed for pain, fever and Ibuprofen 800 mg every 6-8 hours as needed for pain, fever.  Please take Ibuprofen with food.  Do not take more than 4000 mg of Tylenol (acetaminophen) in a 24 hour period.  You may take over-the-counter Imodium as needed for diarrhea.  I recommend a bland diet for the next several days.

## 2023-10-06 NOTE — ED Provider Notes (Signed)
[facility] Provider Note    Event Date/Time   First MD Initiated Contact with Patient 10/06/23 [phone]     (approximate)   History   Abdominal Pain   HPI  Amanda [name] is a 58 y.o. female with history of CHF, diabetes, hypertension, stroke, fibromyalgia, chronic fatigue who presents to the emergency department multiple complaints.  Reports several days of fevers, cough, congestion and now with nausea, vomiting and diarrhea.  Complaining of chest pain, lower back pain.  No dysuria, hematuria, vaginal bleeding or discharge.  States she is having diffuse abdominal discomfort.  She has had sick contacts for flu and COVID.   History provided by patient.    Past Medical History:  Diagnosis Date   ADD (attention deficit disorder)    Anxiety    Arthritis    knees, shoulder, upper back   Asthma    CFS (chronic fatigue syndrome)    Chewing difficulty    CHF (congestive heart failure) (HCC)    Chronic kidney disease    Constipation    Depression    Depression    Diabetes mellitus without complication (HCC)    Dyspnea    Environmental allergies    Fatty liver    Fibromyalgia    GERD (gastroesophageal reflux disease)    GI bleed 10/02/2018   Headache    migraines - 5x/mo   Hypertension    Hypokalemia 10/14/2017   Hypothyroidism    IBS (irritable bowel syndrome)    IDA (iron deficiency anemia) 02/23/2021   Joint pain    Lower extremity edema    Major depressive disorder, single episode    Migraine without aura and without status migrainosus, not intractable    Migraines    Motion sickness    ships   Osteoarthritis    Other specified disorders of thyroid    Sleep apnea    Stroke (HCC)    no residual deficits   Swallowing difficulty    Thyroid nodule    bilateral and goiter   Vitamin D deficiency    Wears contact lenses    Wears dentures    partial upper    Past Surgical History:  Procedure Laterality Date   ABDOMINAL HYSTERECTOMY  2000's    ABDOMINAL HYSTERECTOMY     ABDOMINAL SURGERY     laparoscopy x4 with lysis of adhesions   APPENDECTOMY  1986   CESAREAN SECTION  1992   CHOLECYSTECTOMY N/A 04/03/2017   Procedure: LAPAROSCOPIC CHOLECYSTECTOMY;  Surgeon: Henrene Dodge, MD;  Location: [facility] ORS;  Service: General;  Laterality: N/A;   COLONOSCOPY N/A 10/06/2018   Procedure: COLONOSCOPY;  Surgeon: Toney Reil, MD;  Location: [facility] ENDOSCOPY;  Service: Gastroenterology;  Laterality: N/A;   COLONOSCOPY WITH PROPOFOL N/A 03/10/2017   Procedure: COLONOSCOPY WITH PROPOFOL;  Surgeon: Scot Jun, MD;  Location: [facility] ENDOSCOPY;  Service: Endoscopy;  Laterality: N/A;   ECTOPIC PREGNANCY SURGERY  2000's   ESOPHAGOGASTRODUODENOSCOPY (EGD) WITH PROPOFOL N/A 03/10/2017   Procedure: ESOPHAGOGASTRODUODENOSCOPY (EGD) WITH PROPOFOL;  Surgeon: Scot Jun, MD;  Location: [facility] ENDOSCOPY;  Service: Endoscopy;  Laterality: N/A;   HERNIA REPAIR     JOINT REPLACEMENT Right 12/16/12   knee- medial - makoplasty   KNEE ARTHROSCOPY Right 2006   partial medial and lateral meniscectomies   KNEE ARTHROSCOPY Right 10/06/2015   Procedure: RIGHT KNEE ARTHROSCOPY WITH DEBRIDEMENT;  Surgeon: Erin Sons, MD;  Location: [facility];  Service: Orthopedics;  Laterality: Right;  Diabetic - insulin and  oral meds   ROTATOR CUFF REPAIR Left 2006   TUBAL LIGATION  2000's    MEDICATIONS:  Prior to Admission medications   Medication Sig Start Date End Date Taking? Authorizing Provider  albuterol (VENTOLIN HFA) 108 (90 Base) MCG/ACT inhaler Inhale 2 puffs into the lungs every 6 (six) hours as needed for wheezing or shortness of breath. 02/20/21   Erin Fulling, MD  Ascorbic Acid (VITAMIN C CR) 1000 MG TBCR Take 1 tablet by mouth daily.    [provider]  B-D UF III MINI PEN NEEDLES 31G X 5 MM MISC Inject into the skin daily. 03/18/20   [provider]  BD PEN NEEDLE MICRO U/F 32G X 6 MM MISC Inject into the skin. 06/14/20    [provider]  BELSOMRA 10 MG TABS Take 1 tablet by mouth at bedtime. 01/25/22   [provider]  buprenorphine (BUTRANS) 10 MCG/HR PTWK 1 patch once a week. 03/29/21   [provider]  desvenlafaxine (PRISTIQ) 50 MG 24 hr tablet Take 50 mg by mouth daily. 01/28/21   [provider]  Dexmethylphenidate HCl 25 MG CP24 Take 1 capsule by mouth every morning. 05/09/21   [provider]  estradiol (ESTRACE) 0.1 MG/GM vaginal cream  10/15/19   [provider]  flunisolide (NASALIDE) 25 MCG/ACT (0.025%) SOLN Place into the nose.    [provider]  furosemide (LASIX) 40 MG tablet Take 1 tablet (40 mg total) by mouth 2 (two) times daily. 01/18/22   Delma Freeze, FNP  HUMALOG KWIKPEN 100 UNIT/ML KwikPen Inject into the skin. 04/24/21   [provider]  hydrocortisone 2.5 % ointment Apply topically 2 (two) times daily. 01/23/21   [provider]  hydrOXYzine (ATARAX/VISTARIL) 25 MG tablet Take 25 mg by mouth daily. 04/09/21   [provider]  Insulin Glargine (TOUJEO SOLOSTAR [place]) Inject 62 Units into the skin every morning.    [provider]  Insulin Pen Needle 31G X 6 MM MISC SMARTSIG:1 Needle SUB-Q 4 Times Daily 05/19/20   [provider]  JARDIANCE 25 MG TABS tablet Take 25 mg by mouth daily. 03/18/20   [provider]  LINZESS 290 MCG CAPS capsule Take 290 mcg by mouth daily. 04/12/20   [provider]  LORazepam (ATIVAN) 1 MG tablet Take 1 mg by mouth 2 (two) times daily as needed for anxiety. 09/20/20   [provider]  mirtazapine (REMERON SOL-TAB) 30 MG disintegrating tablet Take 30 mg by mouth daily as needed. 03/18/18   [provider]  montelukast (SINGULAIR) 10 MG tablet Take 10 mg by mouth daily. 04/12/20   [provider]  omeprazole (PRILOSEC) 40 MG capsule Take 40 mg by mouth daily. 04/12/20   [provider]  orlistat (XENICAL) 120 MG capsule  Take 1 capsule by mouth 3 (three) times daily. 01/10/22   [provider]  potassium chloride SA (KLOR-CON) 20 MEQ tablet Take 1 tablet (20 mEq total) by mouth daily. 05/25/21   Delma Freeze, FNP  pravastatin (PRAVACHOL) 40 MG tablet Take 40 mg by mouth daily. 04/12/20   [provider]  pregabalin (LYRICA) 150 MG capsule Take 150 mg by mouth 3 (three) times daily. 04/12/20   [provider]  PROAIR RESPICLICK 108 (90 Base) MCG/ACT AEPB Inhale 2 puffs into the lungs every 4 (four) hours as needed. 11/12/19   [provider]  propranolol ER (INDERAL LA) 80 MG 24 hr capsule Take 1 capsule (80  mg total) by mouth daily. 05/25/21   Delma Freeze, FNP  Semaglutide, 1 MG/DOSE, (OZEMPIC, 1 MG/DOSE,) 2 MG/1.5ML SOPN Inject 2 mg into the skin once a week. On Fridays    [provider]  SUMAtriptan (IMITREX) 50 MG tablet Take 1 tablet (50 mg total) by mouth every 2 (two) hours as needed for migraine. May repeat in 2 hours if headache persists or recurs.  Not to exceed 4 pills in a 24-hour period.  If you find you need more medication and you have taken 4 pills, please go directly to the emergency room or call 911. 11/13/20   Delton See, MD  tiotropium (SPIRIVA HANDIHALER) 18 MCG inhalation capsule Place 1 capsule (18 mcg total) into inhaler and inhale daily. 02/20/21   Erin Fulling, MD  tiZANidine (ZANAFLEX) 4 MG tablet Take 4 mg by mouth every 8 (eight) hours as needed for muscle spasms.     [provider]  topiramate (TOPAMAX) 100 MG tablet Take 100 mg by mouth 2 (two) times daily. 04/12/20   [provider]  TRUEPLUS PEN NEEDLES 31G X 6 MM MISC  05/22/20   [provider]  VITAMIN D, CHOLECALCIFEROL, PO Take 2,000 Units by mouth daily.    [provider]  VRAYLAR 6 MG CAPS Take 1 capsule by mouth daily. 05/09/21   [provider]    Physical Exam   Triage Vital Signs: ED Triage Vitals  Encounter Vitals Group     BP  10/05/23 2148 93/65     Systolic BP Percentile --      Diastolic BP Percentile --      Pulse Rate 10/05/23 2148 82     Resp 10/05/23 2148 18     Temp 10/05/23 2148 98.4 F (36.9 C)     Temp Source 10/05/23 2148 Oral     SpO2 10/05/23 2148 100 %     Weight 10/05/23 2146 202 lb 13.2 oz (92 kg)     Height 10/05/23 2146 5\' 5"  (1.651 m)     Head Circumference --      Peak Flow --      Pain Score 10/05/23 2146 8     Pain Loc --      Pain Education --      Exclude from Growth Chart --     Most recent vital signs: Vitals:   10/05/23 2148  BP: 93/65  Pulse: 82  Resp: 18  Temp: 98.4 F (36.9 C)  SpO2: 100%    CONSTITUTIONAL: Alert, responds appropriately to questions. Well-appearing; well-nourished HEAD: Normocephalic, atraumatic EYES: Conjunctivae clear, pupils appear equal, sclera nonicteric ENT: normal nose; moist mucous membranes NECK: Supple, normal ROM CARD: RRR; S1 and S2 appreciated RESP: Normal chest excursion without splinting or tachypnea; breath sounds clear and equal bilaterally; no wheezes, no rhonchi, no rales, no hypoxia or respiratory distress, speaking full sentences ABD/GI: Non-distended; soft, mildly tender diffusely without guarding or rebound BACK: The back appears normal EXT: Normal ROM in all joints; no deformity noted, no edema SKIN: Normal color for age and race; warm; no rash on exposed skin NEURO: Moves all extremities equally, normal speech PSYCH: The patient's mood and manner are appropriate.   ED Results / Procedures / Treatments   LABS: (all labs ordered are listed, but only abnormal results are displayed) Labs Reviewed  COMPREHENSIVE METABOLIC PANEL - Abnormal; Notable for the following components:      Result Value   Glucose, Bld 207 (*)  Calcium 8.6 (*)    Total Protein 6.2 (*)    Albumin 3.4 (*)    All other components within normal limits  CBC - Abnormal; Notable for the following components:   WBC 3.8 (*)    All other components  within normal limits  RESP PANEL BY RT-PCR (RSV, FLU A&B, COVID)  RVPGX2  LIPASE, BLOOD  TROPONIN I (HIGH SENSITIVITY)     EKG:  EKG Interpretation Date/Time:  Monday October 06 2023 03:02:52 EST Ventricular Rate:  66 PR Interval:  140 QRS Duration:  84 QT Interval:  412 QTC Calculation: 431 R Axis:   22  Text Interpretation: Normal sinus rhythm Cannot rule out Anterior infarct , age undetermined Abnormal ECG When compared with ECG of 12-Sep-2020 19:24, PREVIOUS ECG IS PRESENT Confirmed by Rochele Raring [id number]) on 10/06/2023 3:09:21 AM         RADIOLOGY: My personal review and interpretation of imaging:  Chest x-ray clear.  I have personally reviewed all radiology reports.   DG Chest 2 View Result Date: 10/06/2023 CLINICAL DATA:  Chest pain EXAM: CHEST - 2 VIEW COMPARISON:  09/24/2020 FINDINGS: The heart size and mediastinal contours are within normal limits. Both lungs are clear. The visualized skeletal structures are unremarkable. IMPRESSION: No active cardiopulmonary disease. Electronically Signed   By: Helyn Numbers M.D.   On: 10/06/2023 03:26     PROCEDURES:  Critical Care performed: No     Procedures    IMPRESSION / MDM / ASSESSMENT AND PLAN / ED COURSE  I reviewed the triage vital signs and the nursing notes.    Patient here with flulike symptoms.  The patient is on the cardiac monitor to evaluate for evidence of arrhythmia and/or significant heart rate changes.   DIFFERENTIAL DIAGNOSIS (includes but not limited to):   Viral illness, influenza, COVID, pneumonia, less likely ACS, CHF, PE, dissection.  Doubt appendicitis, colitis, bowel obstruction, pancreatitis, cholecystitis based on relatively benign exam.   Patient's presentation is most consistent with acute complicated illness / injury requiring diagnostic workup.   PLAN: Patient is negative for COVID, flu and RSV.  She does have a leukopenia suggestive of a viral illness.  Normal creatinine, LFTs  and lipase.  Will obtain troponin, chest x-ray.  EKG nonischemic.  Will give Toradol, Zofran, IV fluids for symptomatic relief.   MEDICATIONS GIVEN IN ED: Medications  sodium chloride 0.9 % bolus 1,000 mL (0 mLs Intravenous Stopped 10/06/23 0539)  ondansetron (ZOFRAN) injection 4 mg (4 mg Intravenous Given 10/06/23 0417)  ketorolac (TORADOL) 30 MG/ML injection 30 mg (30 mg Intravenous Given 10/06/23 0418)  morphine (PF) 4 MG/ML injection 4 mg (4 mg Intravenous Given 10/06/23 0538)     ED COURSE: Patient's nausea has improved.  Will p.o. challenge.  Chest x-ray reviewed and interpreted by myself and the radiologist and is unremarkable.  Troponin pending.  Patient still complaining of chest and back pain.  Low suspicion for PE.  No tachycardia, tachypnea or hypoxia.  Doubt dissection.  Likely chest wall pain from coughing.  Will give additional pain medication.   Troponin negative.  Patient tolerating p.o.  I feel she is safe to be discharged.  Discussed supportive care instructions, return precautions.  Recommended Tylenol, Motrin over-the-counter.  Recommended Imodium as needed.  Recommended bland diet.  Will discharge with Zofran.   At this time, I do not feel there is any life-threatening condition present. I reviewed all nursing notes, vitals, pertinent previous records.  All lab and urine results,  EKGs, imaging ordered have been independently reviewed and interpreted by myself.  I reviewed all available radiology reports from any imaging ordered this visit.  Based on my assessment, I feel the patient is safe to be discharged home without further emergent workup and can continue workup as an outpatient as needed. Discussed all findings, treatment plan as well as usual and customary return precautions.  They verbalize understanding and are comfortable with this plan.  Outpatient follow-up has been provided as needed.  All questions have been answered.   CONSULTS:  none   OUTSIDE RECORDS REVIEWED:  Echo in 2021 showed EF of 50 to 55% without wall motion abnormalities.  Normal diastolic parameters.  Reviewed last Hematology note in 2023.       FINAL CLINICAL IMPRESSION(S) / ED DIAGNOSES   Final diagnoses:  Flu-like symptoms  Nausea vomiting and diarrhea     Rx / DC Orders   ED Discharge Orders          Ordered    ondansetron (ZOFRAN-ODT) 4 MG disintegrating tablet  Every 6 hours PRN        10/06/23 0605             Note:  This document was prepared using Dragon voice recognition software and may include unintentional dictation errors.   Rasheena Talmadge, Layla Maw, DO 10/06/23 [phone]

## 2024-02-16 ENCOUNTER — Telehealth: Payer: Self-pay | Admitting: Oncology

## 2024-02-16 NOTE — Telephone Encounter (Signed)
 Pt called and wants to be seen to check iron  levels. Last saw Kenney Peacemaker on 08/06/2022. Lots of no show appts. There were notes that pt was a transferring from [place] to [place], but never actually had an appt with Ronalee Cocking.

## 2024-02-20 ENCOUNTER — Other Ambulatory Visit: Payer: Self-pay | Admitting: *Deleted

## 2024-02-20 DIAGNOSIS — D509 Iron deficiency anemia, unspecified: Secondary | ICD-10-CM

## 2024-02-23 ENCOUNTER — Inpatient Hospital Stay: Attending: Family Medicine

## 2024-02-26 ENCOUNTER — Inpatient Hospital Stay: Admitting: Nurse Practitioner

## 2024-02-26 ENCOUNTER — Encounter: Payer: Self-pay | Admitting: Nurse Practitioner

## 2024-04-06 NOTE — Progress Notes (Signed)
 This encounter was created in error - please disregard.

## 2024-07-26 ENCOUNTER — Encounter: Payer: Self-pay | Admitting: Oncology

## 2024-07-26 ENCOUNTER — Other Ambulatory Visit: Payer: Self-pay

## 2024-07-26 ENCOUNTER — Emergency Department
Admission: EM | Admit: 2024-07-26 | Discharge: 2024-07-26 | Disposition: A | Discharge status: Home / Self Care | Attending: Emergency Medicine | Admitting: Emergency Medicine

## 2024-07-26 DIAGNOSIS — E1165 Type 2 diabetes mellitus with hyperglycemia: Secondary | ICD-10-CM | POA: Insufficient documentation

## 2024-07-26 DIAGNOSIS — I509 Heart failure, unspecified: Secondary | ICD-10-CM | POA: Diagnosis not present

## 2024-07-26 DIAGNOSIS — R739 Hyperglycemia, unspecified: Secondary | ICD-10-CM

## 2024-07-26 DIAGNOSIS — E860 Dehydration: Secondary | ICD-10-CM | POA: Insufficient documentation

## 2024-07-26 LAB — COMPREHENSIVE METABOLIC PANEL WITH GFR
ALT: 50 U/L — ABNORMAL HIGH (ref 0–44)
AST: 34 U/L (ref 15–41)
Albumin: 3.9 g/dL (ref 3.5–5.0)
Alkaline Phosphatase: 138 U/L — ABNORMAL HIGH (ref 38–126)
Anion gap: 11 (ref 5–15)
BUN: 13 mg/dL (ref 6–20)
CO2: 28 mmol/L (ref 22–32)
Calcium: 8.9 mg/dL (ref 8.9–10.3)
Chloride: 93 mmol/L — ABNORMAL LOW (ref 98–111)
Creatinine, Ser: 0.89 mg/dL (ref 0.44–1.00)
GFR, Estimated: >60 mL/min (ref >60)
Glucose, Bld: 275 mg/dL — ABNORMAL HIGH (ref 70–99)
Potassium: 4.8 mmol/L (ref 3.5–5.1)
Sodium: 131 mmol/L — ABNORMAL LOW (ref 135–145)
Total Bilirubin: 0.3 mg/dL (ref 0.0–1.2)
Total Protein: 6.9 g/dL (ref 6.5–8.1)

## 2024-07-26 LAB — CBG MONITORING, ED: Glucose-Capillary: 257 mg/dL — ABNORMAL HIGH (ref 70–99)

## 2024-07-26 LAB — CBC WITH DIFFERENTIAL/PLATELET
Abs Immature Granulocytes: 0.03 K/uL (ref 0.00–0.07)
Basophils Absolute: 0.1 K/uL (ref 0.0–0.1)
Basophils Relative: 1 %
Eosinophils Absolute: 0.1 K/uL (ref 0.0–0.5)
Eosinophils Relative: 2 %
HCT: 39.4 % (ref 36.0–46.0)
Hemoglobin: 13.2 g/dL (ref 12.0–15.0)
Immature Granulocytes: 0 %
Lymphocytes Relative: 25 %
Lymphs Abs: 1.7 K/uL (ref 0.7–4.0)
MCH: 27.8 pg (ref 26.0–34.0)
MCHC: 33.5 g/dL (ref 30.0–36.0)
MCV: 82.9 fL (ref 80.0–100.0)
Monocytes Absolute: 0.6 K/uL (ref 0.1–1.0)
Monocytes Relative: 8 %
Neutro Abs: 4.3 K/uL (ref 1.7–7.7)
Neutrophils Relative %: 64 %
Platelets: 292 K/uL (ref 150–400)
RBC: 4.75 MIL/uL (ref 3.87–5.11)
RDW: 14 % (ref 11.5–15.5)
WBC: 6.8 K/uL (ref 4.0–10.5)
nRBC: 0 % (ref 0.0–0.2)

## 2024-07-26 MED ORDER — NAPROXEN 500 MG PO TABS
500.0000 mg | ORAL_TABLET | Freq: Once | ORAL | Status: AC
  Administered 2024-07-26: 500 mg via ORAL
  Filled 2024-07-26: qty 1

## 2024-07-26 MED ORDER — SODIUM CHLORIDE 0.9 % IV SOLN: Freq: Once | INTRAVENOUS | Status: AC

## 2024-07-26 NOTE — ED Triage Notes (Signed)
 First nurse note: Pt to ED via [facility] from home. EMS reports initially called out for seizure activty. Hx of pseudoseizures and family states shaking lasted about 5 secs. Pt A&Ox4 on Ems arrival. Pt had fall last pm. Pt reports left ankle pain and increased thirst and hx DM. CBG 339.   RR 30 20g RAC 500cc LR  BP 150/96 98% RA

## 2024-07-26 NOTE — ED Provider Notes (Signed)
 [facility] Provider Note    Event Date/Time   First MD Initiated Contact with Patient 07/26/24 1950     (approximate)   History   Hyperglycemia   HPI  Amanda [name] is a 58 y.o. female with extensive past medical history including diabetes, CHF who presents with complaints of elevated glucose and borderline blood pressure.  Patient reports she has been feeling very dehydrated and thinks that is why her blood pressure is low.     Physical Exam   Triage Vital Signs: ED Triage Vitals  Encounter Vitals Group     BP 07/26/24 1816 135/74     Girls Systolic BP Percentile --      Girls Diastolic BP Percentile --      Boys Systolic BP Percentile --      Boys Diastolic BP Percentile --      Pulse Rate 07/26/24 1816 74     Resp 07/26/24 1816 18     Temp 07/26/24 1816 98.4 F (36.9 C)     Temp Source 07/26/24 1816 Oral     SpO2 07/26/24 1816 98 %     Weight --      Height --      Head Circumference --      Peak Flow --      Pain Score 07/26/24 1945 10     Pain Loc --      Pain Education --      Exclude from Growth Chart --     Most recent vital signs: Vitals:   07/26/24 1816  BP: 135/74  Pulse: 74  Resp: 18  Temp: 98.4 F (36.9 C)  SpO2: 98%     General: Awake, no distress.  CV:  Good peripheral perfusion.  Resp:  Normal effort.  Abd:  No distention.  Other:     ED Results / Procedures / Treatments   Labs (all labs ordered are listed, but only abnormal results are displayed) Labs Reviewed  COMPREHENSIVE METABOLIC PANEL WITH GFR - Abnormal; Notable for the following components:      Result Value   Sodium 131 (*)    Chloride 93 (*)    Glucose, Bld 275 (*)    ALT 50 (*)    Alkaline Phosphatase 138 (*)    All other components within normal limits  CBG MONITORING, ED - Abnormal; Notable for the following components:   Glucose-Capillary 257 (*)    All other components within normal limits  CBC WITH DIFFERENTIAL/PLATELET      EKG     RADIOLOGY     PROCEDURES:  Critical Care performed:   Procedures   MEDICATIONS ORDERED IN ED: Medications  0.9 %  sodium chloride  infusion ( Intravenous New Bag/Given 07/26/24 2049)     IMPRESSION / MDM / ASSESSMENT AND PLAN / ED COURSE  I reviewed the triage vital signs and the nursing notes. Patient's presentation is most consistent with acute presentation with potential threat to life or bodily function.  Patient presents with reported low blood pressure, elevated glucose fatigue weakness.  Overall well-appearing and in no acute distress here.  She is likely mildly dehydrated, will treat with IV fluids.  She also reports that she may have sprained her left ankle, reassuring exam, will apply Ace wrap.  Lab work reviewed and is overall reassuring  , Patient feeling better after IV fluids, no indication for admission at this time, appropriate for discharge      FINAL CLINICAL IMPRESSION(S) / ED  DIAGNOSES   Final diagnoses:  Hyperglycemia     Rx / DC Orders   ED Discharge Orders     None        Note:  This document was prepared using Dragon voice recognition software and may include unintentional dictation errors.   Arlander Charleston, MD 07/26/24 2113

## 2024-07-26 NOTE — ED Triage Notes (Signed)
 Patient to ED via [facility]; patient reports she has been having issues with her blood pressure being low. Also states when this happens she starts having Pseudoseizures. Per EMS patient's blood sugar was elevated.

## 2024-09-01 ENCOUNTER — Emergency Department

## 2024-09-01 ENCOUNTER — Observation Stay
Admission: EM | Admit: 2024-09-01 | Discharge: 2024-09-03 | Disposition: A | Discharge status: Home / Self Care | Attending: Internal Medicine | Admitting: Internal Medicine

## 2024-09-01 DIAGNOSIS — I5032 Chronic diastolic (congestive) heart failure: Secondary | ICD-10-CM | POA: Diagnosis not present

## 2024-09-01 DIAGNOSIS — E1165 Type 2 diabetes mellitus with hyperglycemia: Principal | ICD-10-CM | POA: Insufficient documentation

## 2024-09-01 DIAGNOSIS — J45909 Unspecified asthma, uncomplicated: Secondary | ICD-10-CM | POA: Insufficient documentation

## 2024-09-01 DIAGNOSIS — I130 Hypertensive heart and chronic kidney disease with heart failure and stage 1 through stage 4 chronic kidney disease, or unspecified chronic kidney disease: Secondary | ICD-10-CM | POA: Insufficient documentation

## 2024-09-01 DIAGNOSIS — R7989 Other specified abnormal findings of blood chemistry: Secondary | ICD-10-CM

## 2024-09-01 DIAGNOSIS — K760 Fatty (change of) liver, not elsewhere classified: Secondary | ICD-10-CM | POA: Diagnosis not present

## 2024-09-01 DIAGNOSIS — E785 Hyperlipidemia, unspecified: Secondary | ICD-10-CM | POA: Diagnosis not present

## 2024-09-01 DIAGNOSIS — Z79899 Other long term (current) drug therapy: Secondary | ICD-10-CM | POA: Insufficient documentation

## 2024-09-01 DIAGNOSIS — G4730 Sleep apnea, unspecified: Secondary | ICD-10-CM | POA: Diagnosis present

## 2024-09-01 DIAGNOSIS — E039 Hypothyroidism, unspecified: Secondary | ICD-10-CM | POA: Diagnosis not present

## 2024-09-01 DIAGNOSIS — E1122 Type 2 diabetes mellitus with diabetic chronic kidney disease: Secondary | ICD-10-CM | POA: Diagnosis not present

## 2024-09-01 DIAGNOSIS — N189 Chronic kidney disease, unspecified: Secondary | ICD-10-CM | POA: Diagnosis not present

## 2024-09-01 DIAGNOSIS — G4733 Obstructive sleep apnea (adult) (pediatric): Secondary | ICD-10-CM | POA: Insufficient documentation

## 2024-09-01 DIAGNOSIS — F329 Major depressive disorder, single episode, unspecified: Secondary | ICD-10-CM | POA: Diagnosis not present

## 2024-09-01 DIAGNOSIS — E876 Hypokalemia: Secondary | ICD-10-CM | POA: Insufficient documentation

## 2024-09-01 DIAGNOSIS — E1100 Type 2 diabetes mellitus with hyperosmolarity without nonketotic hyperglycemic-hyperosmolar coma (NKHHC): Secondary | ICD-10-CM | POA: Diagnosis present

## 2024-09-01 DIAGNOSIS — R4182 Altered mental status, unspecified: Secondary | ICD-10-CM | POA: Diagnosis present

## 2024-09-01 DIAGNOSIS — G8929 Other chronic pain: Secondary | ICD-10-CM | POA: Insufficient documentation

## 2024-09-01 DIAGNOSIS — J449 Chronic obstructive pulmonary disease, unspecified: Secondary | ICD-10-CM | POA: Insufficient documentation

## 2024-09-01 DIAGNOSIS — Z96651 Presence of right artificial knee joint: Secondary | ICD-10-CM | POA: Diagnosis not present

## 2024-09-01 DIAGNOSIS — K649 Unspecified hemorrhoids: Secondary | ICD-10-CM | POA: Insufficient documentation

## 2024-09-01 DIAGNOSIS — E66812 Obesity, class 2: Secondary | ICD-10-CM | POA: Insufficient documentation

## 2024-09-01 DIAGNOSIS — F909 Attention-deficit hyperactivity disorder, unspecified type: Secondary | ICD-10-CM | POA: Diagnosis not present

## 2024-09-01 DIAGNOSIS — Z7985 Long-term (current) use of injectable non-insulin antidiabetic drugs: Secondary | ICD-10-CM | POA: Insufficient documentation

## 2024-09-01 DIAGNOSIS — K219 Gastro-esophageal reflux disease without esophagitis: Secondary | ICD-10-CM | POA: Diagnosis not present

## 2024-09-01 DIAGNOSIS — E871 Hypo-osmolality and hyponatremia: Secondary | ICD-10-CM | POA: Insufficient documentation

## 2024-09-01 DIAGNOSIS — Z8673 Personal history of transient ischemic attack (TIA), and cerebral infarction without residual deficits: Secondary | ICD-10-CM | POA: Insufficient documentation

## 2024-09-01 DIAGNOSIS — Z794 Long term (current) use of insulin: Secondary | ICD-10-CM | POA: Insufficient documentation

## 2024-09-01 DIAGNOSIS — N179 Acute kidney failure, unspecified: Secondary | ICD-10-CM | POA: Diagnosis not present

## 2024-09-01 DIAGNOSIS — E669 Obesity, unspecified: Secondary | ICD-10-CM | POA: Insufficient documentation

## 2024-09-01 DIAGNOSIS — Z91199 Patient's noncompliance with other medical treatment and regimen due to unspecified reason: Secondary | ICD-10-CM

## 2024-09-01 DIAGNOSIS — Z6836 Body mass index (BMI) 36.0-36.9, adult: Secondary | ICD-10-CM | POA: Insufficient documentation

## 2024-09-01 DIAGNOSIS — Z7984 Long term (current) use of oral hypoglycemic drugs: Secondary | ICD-10-CM | POA: Insufficient documentation

## 2024-09-01 MED ORDER — SODIUM CHLORIDE 0.9 % IV BOLUS
1000.0000 mL | Freq: Once | INTRAVENOUS | Status: AC
  Administered 2024-09-01: 1000 mL via INTRAVENOUS

## 2024-09-01 NOTE — ED Provider Notes (Signed)
 "  [facility] Provider Note    Event Date/Time   First MD Initiated Contact with Patient 09/01/24 2313     (approximate)   History   Altered Mental Status   HPI  Amanda [name] is a 58 y.o. female   Past medical history of diabetes but noncompliant with medications, CHF, iron  deficiency anemia, previous GI bleeding, chronic fatigue syndrome, hypertension, hypothyroid, here with generalized fatigue, generalized weakness, high blood sugar, and fell asleep in the car while getting food with her son.  She was difficult to arouse and so this prompted the EMS call.  EMS reports that en route she is responsive but sluggish and answering questions appropriately.   She also reports mild cough over the last couple of days, mild chest pressure, generalized abdominal bloating pain and intermittent rectal bleeding.  She does not take blood thinner.  She reports that she has felt fatigued, weak, has had high blood sugar, since the last time she was seen in the emergency department around Thanksgiving time.  She continues to not take her blood sugar medications.  She reports polyuria and polydipsia.  She reports no dysuria, fever, vomiting or shortness of breath.  Independent Historian contributed to assessment above: EMS reports as above, systolic blood pressure 90s en route and given half of a 500 cc fluid bolus.  External Medical Documents Reviewed: Outpatient note from nephrology in June 2025 [facility] healthcare system at that time noting multiple ED visits for orthostatic hypotension dehydration and poor kidney function, noting multiple episodes of syncope/presyncope, and a number of psychoactive medications that may contribute as well      Physical Exam   Triage Vital Signs: ED Triage Vitals  Encounter Vitals Group     BP      Girls Systolic BP Percentile      Girls Diastolic BP Percentile      Boys Systolic BP Percentile      Boys Diastolic BP Percentile      Pulse       Resp      Temp      Temp src      SpO2      Weight      Height      Head Circumference      Peak Flow      Pain Score      Pain Loc      Pain Education      Exclude from Growth Chart     Most recent vital signs: Vitals:   09/01/24 2348 09/02/24 0000  BP:  101/62  Pulse:  66  Resp:  17  Temp: 97.6 F (36.4 C)   SpO2:  96%    General: Awake, no distress.  CV:  Good peripheral perfusion.  Resp:  Normal effort.  Other:  Overall fatigued appearing however answering all my questions appropriately, oriented, pleasant.  She has no focality wheezing or rales to lung auscultation, a mildly distended abdomen that is nonfocal but generally tender throughout, mild, and not peritoneal.  Rectal exam with yellow stool no melena or blood.  She is able to move all extremities full active range of motion.   ED Results / Procedures / Treatments   Labs (all labs ordered are listed, but only abnormal results are displayed) Labs Reviewed  LACTIC ACID, PLASMA - Abnormal; Notable for the following components:      Result Value   Lactic Acid, Venous 2.1 (*)    All other components within  normal limits  COMPREHENSIVE METABOLIC PANEL WITH GFR - Abnormal; Notable for the following components:   Sodium 126 (*)    Potassium 5.2 (*)    Chloride 90 (*)    Glucose, Bld 680 (*)    Creatinine, Ser 1.16 (*)    Calcium  8.6 (*)    Total Protein 6.1 (*)    AST 67 (*)    ALT 88 (*)    Alkaline Phosphatase 183 (*)    GFR, Estimated 54 (*)    All other components within normal limits  CBC WITH DIFFERENTIAL/PLATELET - Abnormal; Notable for the following components:   Hemoglobin 11.6 (*)    All other components within normal limits  URINALYSIS, W/ REFLEX TO CULTURE (INFECTION SUSPECTED) - Abnormal; Notable for the following components:   Color, Urine COLORLESS (*)    APPearance CLEAR (*)    Glucose, UA >=500 (*)    Leukocytes,Ua MODERATE (*)    Bacteria, UA RARE (*)    All other components  within normal limits  BETA-HYDROXYBUTYRIC ACID - Abnormal; Notable for the following components:   Beta-Hydroxybutyric Acid 0.30 (*)    All other components within normal limits  BLOOD GAS, VENOUS - Abnormal; Notable for the following components:   Bicarbonate 31.3 (*)    Acid-Base Excess 3.9 (*)    All other components within normal limits  CBG MONITORING, ED - Abnormal; Notable for the following components:   Glucose-Capillary 425 (*)    All other components within normal limits  RESP PANEL BY RT-PCR (RSV, FLU A&B, COVID)  RVPGX2  CULTURE, BLOOD (ROUTINE X 2)  CULTURE, BLOOD (ROUTINE X 2)  PROTIME-INR  TSH  T4, FREE  MAGNESIUM   LACTIC ACID, PLASMA  OSMOLALITY  TROPONIN T, HIGH SENSITIVITY  TROPONIN T, HIGH SENSITIVITY     I ordered and reviewed the above labs they are notable for hyperglycemia without evidence of DKA  EKG  ED ECG REPORT I, Ginnie Shams, the attending physician, personally viewed and interpreted this ECG.   Date: 09/01/2024  EKG Time: 2339  Rate: 69  Rhythm: sinus  Axis: nl  Intervals:nl  ST&T Change: no stemi    RADIOLOGY I independently reviewed and interpreted chest x-ray and see no obvious focality pneumothorax I also reviewed radiologist's formal read.   PROCEDURES:  Critical Care performed: Yes, see critical care procedure note(s)  .Critical Care  Performed by: Shams Ginnie, MD Authorized by: Shams Ginnie, MD   Critical care provider statement:    Critical care time (minutes):  30   Critical care was time spent personally by me on the following activities:  Development of treatment plan with patient or surrogate, discussions with consultants, evaluation of patient's response to treatment, examination of patient, ordering and review of laboratory studies, ordering and review of radiographic studies, ordering and performing treatments and interventions, pulse oximetry, re-evaluation of patient's condition and review of old charts Comments:      Hyperglycemic crisis requiring insulin  infusion    MEDICATIONS ORDERED IN ED: Medications  insulin  regular, human (MYXREDLIN) 100 units/ 100 mL infusion (15 Units/hr Intravenous New Bag/Given 09/02/24 0156)  lactated ringers  infusion ( Intravenous New Bag/Given 09/02/24 0154)  dextrose  5 % in lactated ringers  infusion (has no administration in time range)  dextrose  50 % solution 0-50 mL (has no administration in time range)  sodium chloride  0.9 % bolus 1,000 mL (1,000 mLs Intravenous New Bag/Given 09/01/24 2348)  iohexol  (OMNIPAQUE ) 300 MG/ML solution 100 mL (100 mLs Intravenous Contrast Given 09/02/24 0120)  External physician / consultants:  I spoke with hospital medicine for admission and regarding care plan for this patient.   IMPRESSION / MDM / ASSESSMENT AND PLAN / ED COURSE  I reviewed the triage vital signs and the nursing notes.                                Patient's presentation is most consistent with acute presentation with potential threat to life or bodily function.  Differential diagnosis includes, but is not limited to, hyperglycemia, DKA, infection other respiratory or intra-abdominal, GI bleeding, anemia, syncope, ACS   The patient is on the cardiac monitor to evaluate for evidence of arrhythmia and/or significant heart rate changes.  MDM:    Hyperglycemic must rule out DKA with labs, will give fluids looks dehydrated rather than fluid overloaded and so I think it is okay to start with a liter despite her history of CHF.  Multiple infectious sources could possibly be contributing given her cough, chest pressure, and abdominal pain.  Check CT scan chest abdomen and pelvis, sepsis labs given hypotension, check urine, check viral swabs.  Regarding chest pressure, check EKG and serial troponin.   -- No DKA.  Concern for HHS.  Started on Endo tool management insulin  infusion.  CT scan chest abdomen pelvis with no acute abnormalities.  Patient stable.   Admission.       FINAL CLINICAL IMPRESSION(S) / ED DIAGNOSES   Final diagnoses:  Hyperglycemic crisis due to diabetes mellitus (HCC)     Rx / DC Orders   ED Discharge Orders     None        Note:  This document was prepared using Dragon voice recognition software and may include unintentional dictation errors.    Cyrena Mylar, MD 09/02/24 [phone]  "

## 2024-09-01 NOTE — ED Triage Notes (Signed)
 Pt here via [facility]. Daughter called EMS for dizziness, AMS, and pt not feeling herself. She is sluggish and slightly disoriented. A&Ox4, but slow. Hx CHF and type 2 diabetes. Pt admits to being non-compliant w/ medications. Pt took insulin  before EMS arrival. EMS vitals: 73 SR 97% RA CBG high BP 95/53 - received NS bolus and went to 102/59

## 2024-09-02 ENCOUNTER — Emergency Department

## 2024-09-02 ENCOUNTER — Other Ambulatory Visit: Payer: Self-pay

## 2024-09-02 ENCOUNTER — Encounter: Payer: Self-pay | Admitting: Hospitalist

## 2024-09-02 DIAGNOSIS — E1100 Type 2 diabetes mellitus with hyperosmolarity without nonketotic hyperglycemic-hyperosmolar coma (NKHHC): Secondary | ICD-10-CM

## 2024-09-02 DIAGNOSIS — Z91199 Patient's noncompliance with other medical treatment and regimen due to unspecified reason: Secondary | ICD-10-CM

## 2024-09-02 LAB — BLOOD GAS, VENOUS
Acid-Base Excess: 3.9 mmol/L — ABNORMAL HIGH (ref 0.0–2.0)
Bicarbonate: 31.3 mmol/L — ABNORMAL HIGH (ref 20.0–28.0)
O2 Saturation: 51.1 %
Patient temperature: 37
pCO2, Ven: 58 mmHg (ref 44–60)
pH, Ven: 7.34 (ref 7.25–7.43)
pO2, Ven: 32 mmHg (ref 32–45)

## 2024-09-02 LAB — URINALYSIS, W/ REFLEX TO CULTURE (INFECTION SUSPECTED)
Bilirubin Urine: NEGATIVE
Glucose, UA: >=500 mg/dL — AB
Hgb urine dipstick: NEGATIVE
Ketones, ur: NEGATIVE mg/dL
Nitrite: NEGATIVE
Protein, ur: NEGATIVE mg/dL
Specific Gravity, Urine: 1.025 (ref 1.005–1.030)
Squamous Epithelial / HPF: 0 /HPF (ref 0–5)
pH: 5 (ref 5.0–8.0)

## 2024-09-02 LAB — CBC WITH DIFFERENTIAL/PLATELET
Abs Immature Granulocytes: 0.03 K/uL (ref 0.00–0.07)
Basophils Absolute: 0 K/uL (ref 0.0–0.1)
Basophils Relative: 0 %
Eosinophils Absolute: 0.1 K/uL (ref 0.0–0.5)
Eosinophils Relative: 1 %
HCT: 36.2 % (ref 36.0–46.0)
Hemoglobin: 11.6 g/dL — ABNORMAL LOW (ref 12.0–15.0)
Immature Granulocytes: 1 %
Lymphocytes Relative: 37 %
Lymphs Abs: 2.2 K/uL (ref 0.7–4.0)
MCH: 28 pg (ref 26.0–34.0)
MCHC: 32 g/dL (ref 30.0–36.0)
MCV: 87.2 fL (ref 80.0–100.0)
Monocytes Absolute: 0.4 K/uL (ref 0.1–1.0)
Monocytes Relative: 7 %
Neutro Abs: 3.3 K/uL (ref 1.7–7.7)
Neutrophils Relative %: 54 %
Platelets: 157 K/uL (ref 150–400)
RBC: 4.15 MIL/uL (ref 3.87–5.11)
RDW: 14.5 % (ref 11.5–15.5)
WBC: 6 K/uL (ref 4.0–10.5)
nRBC: 0 % (ref 0.0–0.2)

## 2024-09-02 LAB — COMPREHENSIVE METABOLIC PANEL WITH GFR
ALT: 88 U/L — ABNORMAL HIGH (ref 0–44)
AST: 67 U/L — ABNORMAL HIGH (ref 15–41)
Albumin: 3.6 g/dL (ref 3.5–5.0)
Alkaline Phosphatase: 183 U/L — ABNORMAL HIGH (ref 38–126)
Anion gap: 14 (ref 5–15)
BUN: 18 mg/dL (ref 6–20)
CO2: 23 mmol/L (ref 22–32)
Calcium: 8.6 mg/dL — ABNORMAL LOW (ref 8.9–10.3)
Chloride: 90 mmol/L — ABNORMAL LOW (ref 98–111)
Creatinine, Ser: 1.16 mg/dL — ABNORMAL HIGH (ref 0.44–1.00)
GFR, Estimated: 54 mL/min — ABNORMAL LOW
Glucose, Bld: 680 mg/dL (ref 70–99)
Potassium: 5.2 mmol/L — ABNORMAL HIGH (ref 3.5–5.1)
Sodium: 126 mmol/L — ABNORMAL LOW (ref 135–145)
Total Bilirubin: 0.2 mg/dL (ref 0.0–1.2)
Total Protein: 6.1 g/dL — ABNORMAL LOW (ref 6.5–8.1)

## 2024-09-02 LAB — OSMOLALITY: Osmolality: 294 mosm/kg (ref 275–295)

## 2024-09-02 LAB — BASIC METABOLIC PANEL WITH GFR
Anion gap: 8 (ref 5–15)
BUN: 14 mg/dL (ref 6–20)
CO2: 29 mmol/L (ref 22–32)
Calcium: 9.2 mg/dL (ref 8.9–10.3)
Chloride: 101 mmol/L (ref 98–111)
Creatinine, Ser: 0.91 mg/dL (ref 0.44–1.00)
GFR, Estimated: >60 mL/min
Glucose, Bld: 156 mg/dL — ABNORMAL HIGH (ref 70–99)
Potassium: 3.1 mmol/L — ABNORMAL LOW (ref 3.5–5.1)
Sodium: 138 mmol/L (ref 135–145)

## 2024-09-02 LAB — T4, FREE: Free T4: 1.32 ng/dL (ref 0.80–2.00)

## 2024-09-02 LAB — PROTIME-INR
INR: 0.9 (ref 0.8–1.2)
Prothrombin Time: 12.6 s (ref 11.4–15.2)

## 2024-09-02 LAB — LACTIC ACID, PLASMA
Lactic Acid, Venous: 1.3 mmol/L (ref 0.5–1.9)
Lactic Acid, Venous: 2.1 mmol/L (ref 0.5–1.9)

## 2024-09-02 LAB — BETA-HYDROXYBUTYRIC ACID: Beta-Hydroxybutyric Acid: 0.3 mmol/L — ABNORMAL HIGH (ref 0.05–0.27)

## 2024-09-02 LAB — MAGNESIUM: Magnesium: 1.9 mg/dL (ref 1.7–2.4)

## 2024-09-02 LAB — CBG MONITORING, ED
Glucose-Capillary: 366 mg/dL — ABNORMAL HIGH (ref 70–99)
Glucose-Capillary: 425 mg/dL — ABNORMAL HIGH (ref 70–99)
Glucose-Capillary: 209 mg/dL — ABNORMAL HIGH (ref 70–99)
Glucose-Capillary: 148 mg/dL — ABNORMAL HIGH (ref 70–99)
Glucose-Capillary: 119 mg/dL — ABNORMAL HIGH (ref 70–99)
Glucose-Capillary: 125 mg/dL — ABNORMAL HIGH (ref 70–99)
Glucose-Capillary: 120 mg/dL — ABNORMAL HIGH (ref 70–99)
Glucose-Capillary: 160 mg/dL — ABNORMAL HIGH (ref 70–99)
Glucose-Capillary: 230 mg/dL — ABNORMAL HIGH (ref 70–99)

## 2024-09-02 LAB — RESP PANEL BY RT-PCR (RSV, FLU A&B, COVID)  RVPGX2
Influenza A by PCR: NEGATIVE
Influenza B by PCR: NEGATIVE
Resp Syncytial Virus by PCR: NEGATIVE
SARS Coronavirus 2 by RT PCR: NEGATIVE

## 2024-09-02 LAB — GLUCOSE, CAPILLARY
Glucose-Capillary: 187 mg/dL — ABNORMAL HIGH (ref 70–99)
Glucose-Capillary: 200 mg/dL — ABNORMAL HIGH (ref 70–99)

## 2024-09-02 LAB — TSH: TSH: 3.68 u[IU]/mL (ref 0.350–4.500)

## 2024-09-02 LAB — TROPONIN T, HIGH SENSITIVITY
Troponin T High Sensitivity: <15 ng/L (ref 0–19)
Troponin T High Sensitivity: <15 ng/L (ref 0–19)

## 2024-09-02 MED ORDER — HYDROCORTISONE ACETATE 25 MG RE SUPP
25.0000 mg | Freq: Two times a day (BID) | RECTAL | Status: DC
  Administered 2024-09-02 (×2): 25 mg via RECTAL
  Filled 2024-09-02 (×3): qty 1

## 2024-09-02 MED ORDER — ACETAMINOPHEN 650 MG RE SUPP: 650.0000 mg | Freq: Four times a day (QID) | RECTAL | Status: DC | PRN

## 2024-09-02 MED ORDER — ACETAMINOPHEN 325 MG PO TABS: 650.0000 mg | ORAL_TABLET | Freq: Four times a day (QID) | ORAL | Status: DC | PRN

## 2024-09-02 MED ORDER — POTASSIUM CHLORIDE 10 MEQ/100ML IV SOLN
10.0000 meq | INTRAVENOUS | Status: AC
  Administered 2024-09-02 (×3): 10 meq via INTRAVENOUS
  Filled 2024-09-02: qty 100

## 2024-09-02 MED ORDER — DEXTROSE IN LACTATED RINGERS 5 % IV SOLN: INTRAVENOUS | Status: DC

## 2024-09-02 MED ORDER — INSULIN ASPART 100 UNIT/ML IJ SOLN: 0.0000 [IU] | Freq: Every day | INTRAMUSCULAR | Status: DC

## 2024-09-02 MED ORDER — AMPHETAMINE-DEXTROAMPHET ER 5 MG PO CP24
20.0000 mg | ORAL_CAPSULE | Freq: Every morning | ORAL | Status: DC
  Filled 2024-09-02: qty 4

## 2024-09-02 MED ORDER — IOHEXOL 300 MG/ML  SOLN
100.0000 mL | Freq: Once | INTRAMUSCULAR | Status: AC | PRN
  Administered 2024-09-02: 100 mL via INTRAVENOUS

## 2024-09-02 MED ORDER — LACTATED RINGERS IV SOLN: INTRAVENOUS | Status: DC

## 2024-09-02 MED ORDER — DEXTROSE 50 % IV SOLN: 0.0000 mL | INTRAVENOUS | Status: DC | PRN

## 2024-09-02 MED ORDER — SENNA 8.6 MG PO TABS
1.0000 | ORAL_TABLET | Freq: Two times a day (BID) | ORAL | Status: DC
  Administered 2024-09-02: 8.6 mg via ORAL
  Filled 2024-09-02 (×2): qty 1

## 2024-09-02 MED ORDER — INSULIN ASPART 100 UNIT/ML IJ SOLN
0.0000 [IU] | Freq: Three times a day (TID) | INTRAMUSCULAR | Status: DC
  Administered 2024-09-02: 3 [IU] via SUBCUTANEOUS
  Administered 2024-09-02: 5 [IU] via SUBCUTANEOUS
  Administered 2024-09-03: 3 [IU] via SUBCUTANEOUS
  Filled 2024-09-02 (×2): qty 3
  Filled 2024-09-02: qty 5

## 2024-09-02 MED ORDER — INSULIN REGULAR(HUMAN) IN NACL 100-0.9 UT/100ML-% IV SOLN
INTRAVENOUS | Status: DC
  Administered 2024-09-02: 15 [IU]/h via INTRAVENOUS
  Filled 2024-09-02: qty 100

## 2024-09-02 MED ORDER — POLYETHYLENE GLYCOL 3350 17 G PO PACK: 17.0000 g | PACK | Freq: Every day | ORAL | Status: DC | PRN

## 2024-09-02 MED ORDER — MORPHINE SULFATE (PF) 2 MG/ML IV SOLN: 2.0000 mg | INTRAVENOUS | Status: DC | PRN

## 2024-09-02 MED ORDER — ALBUTEROL SULFATE (2.5 MG/3ML) 0.083% IN NEBU: 2.5000 mg | INHALATION_SOLUTION | Freq: Four times a day (QID) | RESPIRATORY_TRACT | Status: DC | PRN

## 2024-09-02 MED ORDER — TIZANIDINE HCL 4 MG PO TABS
4.0000 mg | ORAL_TABLET | Freq: Three times a day (TID) | ORAL | Status: DC
  Administered 2024-09-02 – 2024-09-03 (×3): 4 mg via ORAL
  Filled 2024-09-02 (×3): qty 1

## 2024-09-02 MED ORDER — OXYCODONE HCL 5 MG PO TABS: 5.0000 mg | ORAL_TABLET | ORAL | Status: DC | PRN

## 2024-09-02 MED ORDER — ONDANSETRON HCL 4 MG PO TABS: 4.0000 mg | ORAL_TABLET | Freq: Four times a day (QID) | ORAL | Status: DC | PRN

## 2024-09-02 MED ORDER — HYDROXYZINE HCL 25 MG PO TABS
25.0000 mg | ORAL_TABLET | Freq: Every day | ORAL | Status: DC
  Administered 2024-09-02: 25 mg via ORAL
  Filled 2024-09-02 (×2): qty 1

## 2024-09-02 MED ORDER — PRAVASTATIN SODIUM 20 MG PO TABS
40.0000 mg | ORAL_TABLET | Freq: Every evening | ORAL | Status: DC
  Administered 2024-09-02: 40 mg via ORAL
  Filled 2024-09-02: qty 2

## 2024-09-02 MED ORDER — ZOLPIDEM TARTRATE 5 MG PO TABS
5.0000 mg | ORAL_TABLET | Freq: Every evening | ORAL | Status: DC | PRN
  Administered 2024-09-02: 5 mg via ORAL
  Filled 2024-09-02: qty 1

## 2024-09-02 MED ORDER — INSULIN GLARGINE 100 UNIT/ML ~~LOC~~ SOLN
12.0000 [IU] | Freq: Every day | SUBCUTANEOUS | Status: DC
  Administered 2024-09-02: 12 [IU] via SUBCUTANEOUS
  Filled 2024-09-02 (×2): qty 0.12

## 2024-09-02 MED ORDER — ONDANSETRON HCL 4 MG/2ML IJ SOLN: 4.0000 mg | Freq: Four times a day (QID) | INTRAMUSCULAR | Status: DC | PRN

## 2024-09-02 MED ORDER — AMPHETAMINE-DEXTROAMPHET ER 20 MG PO CP24: 20.0000 mg | ORAL_CAPSULE | Freq: Every morning | ORAL | Status: DC

## 2024-09-02 NOTE — ED Notes (Addendum)
 Pt transitioned to sub q per Dr Cleatus. Long acting insulin  given, two hour mark is completed.

## 2024-09-02 NOTE — ED Notes (Signed)
 Pt asking for diet ginger ale, given by this RN.

## 2024-09-02 NOTE — H&P (Addendum)
 "  History and Physical    Amanda [name] FMW:[id number] DOB: August 29, 1966 DOA: 09/01/2024  DOS: the patient was seen and examined on 09/01/2024  PCP: Lorel Maxie LABOR, MD   Patient coming from: Home  I have personally briefly reviewed patient's old medical records in [facility]  Chief Complaint: Confusion  HPI: Amanda [name] is a pleasant 59 y.o. female with medical history significant for type 2 diabetes not on any medication, CHF, iron  deficiency anemia, previous GI bleeding, chronic fatigue syndrome, HTN, hypothyroidism came in for generalized fatigue, generalized weakness, high blood sugars and fell asleep in the car while getting food with her son.  She was difficult to arouse and so they called EMS.  EMS reports that in the route she is responsive but still obvious and answering questions appropriately. Patient is alert awake and oriented on my exam.  She stated that due to her problem with buying insulin , she has not been using any medications.  She stated that she has some mild cough over the last few days generalized weakness, not feeling well and some chest discomfort, abdominal pain and also intermittent bleeding from her hemorrhoid.  She does not take any blood thinners.  She also reports that she has been thirsty and urinating a lot.  She denies any fever, chills, nausea, vomiting, headache, chest pain, palpitations, dysuria, hematuria.  ED Course: Upon arrival to the ED, patient is found to be alert awake and oriented.  Lactic acid was 2.1, sodium 126, potassium 5.2, glucose 680, creatinine 1.16, calcium  8.6, urine analysis did not show any signs of acute urinary tract infection, chest x-ray showed mild pulmonary congestion, no pneumonia, CT chest abdomen and pelvis did not show any acute pathology.  She has small pericardial effusion.  Patient was started on insulin  drip and hospitalist service was consulted for evaluation for admission for diabetes control.  Review of Systems:  ROS   All other systems negative except as noted in the HPI.  Past Medical History:  Diagnosis Date   ADD (attention deficit disorder)    Anxiety    Arthritis    knees, shoulder, upper back   Asthma    CFS (chronic fatigue syndrome)    Chewing difficulty    CHF (congestive heart failure) (HCC)    Chronic kidney disease    Constipation    Depression    Depression    Diabetes mellitus without complication (HCC)    Dyspnea    Environmental allergies    Fatty liver    Fibromyalgia    GERD (gastroesophageal reflux disease)    GI bleed 10/02/2018   Headache    migraines - 5x/mo   Hypertension    Hypokalemia 10/14/2017   Hypothyroidism    IBS (irritable bowel syndrome)    IDA (iron  deficiency anemia) 02/23/2021   Joint pain    Lower extremity edema    Major depressive disorder, single episode    Migraine without aura and without status migrainosus, not intractable    Migraines    Motion sickness    ships   Osteoarthritis    Other specified disorders of thyroid     Sleep apnea    Stroke (HCC)    no residual deficits   Swallowing difficulty    Thyroid  nodule    bilateral and goiter   Vitamin D  deficiency    Wears contact lenses    Wears dentures    partial upper    Past Surgical History:  Procedure Laterality Date  ABDOMINAL HYSTERECTOMY  2000's   ABDOMINAL HYSTERECTOMY     ABDOMINAL SURGERY     laparoscopy x4 with lysis of adhesions   APPENDECTOMY  1986   CESAREAN SECTION  1992   CHOLECYSTECTOMY N/A 04/03/2017   Procedure: LAPAROSCOPIC CHOLECYSTECTOMY;  Surgeon: Desiderio Schanz, MD;  Location: [facility] ORS;  Service: General;  Laterality: N/A;   COLONOSCOPY N/A 10/06/2018   Procedure: COLONOSCOPY;  Surgeon: Unk Corinn Skiff, MD;  Location: [facility] ENDOSCOPY;  Service: Gastroenterology;  Laterality: N/A;   COLONOSCOPY WITH PROPOFOL  N/A 03/10/2017   Procedure: COLONOSCOPY WITH PROPOFOL ;  Surgeon: Viktoria Lamar DASEN, MD;  Location[facility] ENDOSCOPY;  Service: Endoscopy;  Laterality:  N/A;   ECTOPIC PREGNANCY SURGERY  2000's   ESOPHAGOGASTRODUODENOSCOPY (EGD) WITH PROPOFOL  N/A 03/10/2017   Procedure: ESOPHAGOGASTRODUODENOSCOPY (EGD) WITH PROPOFOL ;  Surgeon: Viktoria Lamar DASEN, MD;  Location: [facility] ENDOSCOPY;  Service: Endoscopy;  Laterality: N/A;   HERNIA REPAIR     JOINT REPLACEMENT Right 12/16/12   knee- medial - makoplasty   KNEE ARTHROSCOPY Right 2006   partial medial and lateral meniscectomies   KNEE ARTHROSCOPY Right 10/06/2015   Procedure: RIGHT KNEE ARTHROSCOPY WITH DEBRIDEMENT;  Surgeon: Helayne Glenn, MD;  Location: [facility];  Service: Orthopedics;  Laterality: Right;  Diabetic - insulin  and oral meds   ROTATOR CUFF REPAIR Left 2006   TUBAL LIGATION  2000's     reports that she has never smoked. She has never used smokeless tobacco. She reports that she does not currently use alcohol. She reports that she does not use drugs.  Allergies[1]  Family History  Problem Relation Age of Onset   Hypertension Mother    Diabetes Mother    Heart disease Mother    Depression Mother    Obesity Mother    Hypertension Father    Diabetes Father    Allergies Father    Kidney disease Father    Cancer Father    Breast cancer Neg Hx     Prior to Admission medications  Medication Sig Start Date End Date Taking? Authorizing Provider  albuterol  (VENTOLIN  HFA) 108 (90 Base) MCG/ACT inhaler Inhale 2 puffs into the lungs every 6 (six) hours as needed for wheezing or shortness of breath. 02/20/21  Yes Kasa, Kurian, MD  amphetamine-dextroamphetamine (ADDERALL XR) 20 MG 24 hr capsule Take 20 mg by mouth every morning. 07/14/24  Yes [provider]  Ascorbic Acid  (VITAMIN C  CR) 1000 MG TBCR Take 1 tablet by mouth daily.   Yes [provider]  buprenorphine (BUTRANS) 10 MCG/HR PTWK 1 patch once a week. 03/29/21  Yes [provider]  desvenlafaxine (PRISTIQ) 50 MG 24 hr tablet Take 50 mg by mouth daily. 01/28/21  Yes [provider]   estradiol (ESTRACE) 0.1 MG/GM vaginal cream  10/15/19  Yes [provider]  Eszopiclone 3 MG TABS Take 3 mg by mouth at bedtime. 08/25/24  Yes [provider]  flunisolide (NASALIDE) 25 MCG/ACT (0.025%) SOLN Place into the nose.   Yes [provider]  furosemide  (LASIX ) 40 MG tablet Take 1 tablet (40 mg total) by mouth 2 (two) times daily. 01/18/22  Yes Hackney, Tina A, FNP  HUMALOG KWIKPEN 100 UNIT/ML KwikPen Inject into the skin. 04/24/21  Yes [provider]  hydrocortisone  2.5 % ointment Apply topically 2 (two) times daily. 01/23/21  Yes [provider]  hydrOXYzine (ATARAX/VISTARIL) 25 MG tablet Take 25 mg by mouth daily. 04/09/21  Yes [provider]  hydrOXYzine (VISTARIL) 100 MG capsule Take  100 mg by mouth at bedtime. 08/31/24  Yes [provider]  Insulin  Glargine (TOUJEO SOLOSTAR [place]) Inject 62 Units into the skin every morning.   Yes [provider]  JARDIANCE 25 MG TABS tablet Take 25 mg by mouth daily. 03/18/20  Yes [provider]  LINZESS  290 MCG CAPS capsule Take 290 mcg by mouth daily. 04/12/20  Yes [provider]  LORazepam  (ATIVAN ) 1 MG tablet Take 1 mg by mouth 2 (two) times daily as needed for anxiety. 09/20/20  Yes [provider]  meloxicam  (MOBIC ) 15 MG tablet Take 15 mg by mouth daily. 09/01/24  Yes [provider]  mirtazapine  (REMERON ) 30 MG tablet Take 30 mg by mouth at bedtime. 09/01/24  Yes [provider]  montelukast  (SINGULAIR ) 10 MG tablet Take 10 mg by mouth at bedtime. 04/12/20  Yes [provider]  omeprazole  (PRILOSEC) 40 MG capsule Take 40 mg by mouth daily. 04/12/20  Yes [provider]  ondansetron  (ZOFRAN -ODT) 4 MG disintegrating tablet Take 1 tablet (4 mg total) by mouth every 6 (six) hours as needed for nausea or vomiting. 10/06/23  Yes Ward, Josette SAILOR, DO  orlistat (XENICAL) 120 MG capsule Take 1 capsule by mouth 3 (three) times  daily. 01/10/22  Yes [provider]  potassium chloride  SA (KLOR-CON ) 20 MEQ tablet Take 1 tablet (20 mEq total) by mouth daily. 05/25/21  Yes Hackney, Ellouise A, FNP  pravastatin  (PRAVACHOL ) 40 MG tablet Take 40 mg by mouth daily. 04/12/20  Yes [provider]  pregabalin  (LYRICA ) 150 MG capsule Take 150 mg by mouth 3 (three) times daily. 04/12/20  Yes [provider]  PROAIR  RESPICLICK 108 (90 Base) MCG/ACT AEPB Inhale 2 puffs into the lungs every 4 (four) hours as needed. 11/12/19  Yes [provider]  propranolol  ER (INDERAL  LA) 80 MG 24 hr capsule Take 1 capsule (80 mg total) by mouth daily. 05/25/21  Yes Hackney, Tina A, FNP  Semaglutide, 1 MG/DOSE, (OZEMPIC, 1 MG/DOSE,) 2 MG/1.5ML SOPN Inject 2 mg into the skin once a week. On Fridays   Yes [provider]  SUMAtriptan  (IMITREX ) 50 MG tablet Take 1 tablet (50 mg total) by mouth every 2 (two) hours as needed for migraine. May repeat in 2 hours if headache persists or recurs.  Not to exceed 4 pills in a 24-hour period.  If you find you need more medication and you have taken 4 pills, please go directly to the emergency room or call 911. 11/13/20  Yes Gwenn Kent, MD  tiotropium (SPIRIVA  HANDIHALER) 18 MCG inhalation capsule Place 1 capsule (18 mcg total) into inhaler and inhale daily. 02/20/21  Yes Kasa, Kurian, MD  tiZANidine  (ZANAFLEX ) 4 MG tablet Take 4 mg by mouth every 8 (eight) hours.   Yes [provider]  topiramate  (TOPAMAX ) 100 MG tablet Take 100 mg by mouth 2 (two) times daily. 04/12/20  Yes [provider]  VITAMIN D , CHOLECALCIFEROL , PO Take 2,000 Units by mouth daily.   Yes [provider]  VRAYLAR  4.5 MG CAPS Take 1 capsule by mouth daily. 08/31/24  Yes [provider]  B-D UF III MINI PEN NEEDLES 31G X 5 MM MISC Inject into the skin daily. 03/18/20   [provider]  BD PEN NEEDLE MICRO U/F 32G X 6 MM MISC Inject into the skin. 06/14/20   [provider]  BELSOMRA 10 MG TABS Take 1 tablet by mouth at bedtime. Patient not taking: Reported on 09/02/2024 01/25/22  [provider]  Dexmethylphenidate  HCl 25 MG CP24 Take 1 capsule by mouth every morning. Patient not taking: Reported on 09/02/2024 05/09/21   [provider]  Insulin  Pen Needle 31G X 6 MM MISC SMARTSIG:1 Needle SUB-Q 4 Times Daily 05/19/20   [provider]  mirtazapine  (REMERON  SOL-TAB) 30 MG disintegrating tablet Take 30 mg by mouth daily. Patient not taking: Reported on 09/02/2024 03/18/18   [provider]  TRUEPLUS PEN NEEDLES 31G X 6 MM MISC  05/22/20   [provider]  VRAYLAR  6 MG CAPS Take 1 capsule by mouth daily. Patient not taking: Reported on 09/02/2024 05/09/21   [provider]    Physical Exam: Vitals:   09/02/24 0600 09/02/24 0630 09/02/24 0730 09/02/24 0800  BP: (!) 131/92 (!) 136/96 (!) 142/95 (!) 133/95  Pulse: 73 77 75 76  Resp: (!) 21 (!) 21 15 20   Temp:      TempSrc:      SpO2: 98% 100% 94% 97%  Weight:      Height:        Physical Exam   Constitutional: Alert, awake, calm, comfortable HEENT: Neck supple Respiratory: Clear to auscultation B/L, no wheezing, no rales.  Cardiovascular: Regular rate and rhythm, no murmurs / rubs / gallops. No extremity edema. 2+ pedal pulses. No carotid bruits.  Abdomen: Soft, no tenderness, Bowel sounds positive.  Musculoskeletal: no clubbing / cyanosis. Good ROM, no contractures. Normal muscle tone.  Skin: no rashes, lesions, ulcers. Neurologic: CN 2-12 grossly intact. Sensation intact, No focal deficit identified Psychiatric: Alert and oriented x 3. Normal mood.    Labs on Admission: I have personally reviewed following labs and imaging studies  CBC: Recent Labs  Lab 09/01/24 2347  WBC 6.0  NEUTROABS 3.3  HGB 11.6*  HCT 36.2  MCV 87.2  PLT 157   Basic Metabolic Panel: Recent Labs  Lab 09/01/24 2347 09/02/24 0435  NA 126* 138  K 5.2* 3.1*  CL  90* 101  CO2 23 29  GLUCOSE 680* 156*  BUN 18 14  CREATININE 1.16* 0.91  CALCIUM  8.6* 9.2  MG 1.9  --    GFR: Estimated Creatinine Clearance: 74.5 mL/min (by C-G formula based on SCr of 0.91 mg/dL). Liver Function Tests: Recent Labs  Lab 09/01/24 2347  AST 67*  ALT 88*  ALKPHOS 183*  BILITOT 0.2  PROT 6.1*  ALBUMIN 3.6   No results for input(s): LIPASE, AMYLASE in the last 168 hours. No results for input(s): AMMONIA in the last 168 hours. Coagulation Profile: Recent Labs  Lab 09/01/24 2347  INR 0.9   Cardiac Enzymes: No results for input(s): CKTOTAL, CKMB, CKMBINDEX, TROPONINI, TROPONINIHS in the last 168 hours. BNP (last 3 results) No results for input(s): BNP in the last 8760 hours. HbA1C: No results for input(s): HGBA1C in the last 72 hours. CBG: Recent Labs  Lab 09/02/24 0431 09/02/24 0532 09/02/24 0647 09/02/24 0742 09/02/24 0922  GLUCAP 148* 119* 125* 120* 160*   Lipid Profile: No results for input(s): CHOL, HDL, LDLCALC, TRIG, CHOLHDL, LDLDIRECT in the last 72 hours. Thyroid  Function Tests: Recent Labs    09/01/24 2347  TSH 3.680  FREET4 1.32   Anemia Panel: No results for input(s): VITAMINB12, FOLATE, FERRITIN, TIBC, IRON , RETICCTPCT in the last 72 hours. Urine analysis:    Component Value Date/Time   COLORURINE COLORLESS (A) 09/01/2024 2347   APPEARANCEUR CLEAR (A) 09/01/2024 2347   APPEARANCEUR Cloudy 11/12/2011 1721   LABSPEC 1.025 09/01/2024 2347  LABSPEC 1.010 11/12/2011 1721   PHURINE 5.0 09/01/2024 2347   GLUCOSEU >=500 (A) 09/01/2024 2347   GLUCOSEU Negative 11/12/2011 1721   HGBUR NEGATIVE 09/01/2024 2347   BILIRUBINUR NEGATIVE 09/01/2024 2347   BILIRUBINUR Negative 11/12/2011 1721   KETONESUR NEGATIVE 09/01/2024 2347   PROTEINUR NEGATIVE 09/01/2024 2347   NITRITE NEGATIVE 09/01/2024 2347   LEUKOCYTESUR MODERATE (A) 09/01/2024 2347   LEUKOCYTESUR 1+ 11/12/2011 1721     Radiological Exams on Admission: I have personally reviewed images CT CHEST ABDOMEN PELVIS W CONTRAST Result Date: 09/02/2024 EXAM: CT CHEST, ABDOMEN AND PELVIS WITH CONTRAST 09/02/2024 01:29:45 AM TECHNIQUE: CT of the chest, abdomen and pelvis was performed with the administration of 100 mL of iohexol  (OMNIPAQUE ) 300 MG/ML solution intravenous contrast. Multiplanar reformatted images are provided for review. Automated exposure control, iterative reconstruction, and/or weight based adjustment of the mA/kV was utilized to reduce the radiation dose to as low as reasonably achievable. COMPARISON: Remote prior examination of 08/23/2017. CLINICAL HISTORY: resp infection sx, hypotension, and abd pain w LGIB FINDINGS: CHEST: MEDIASTINUM AND LYMPH NODES: Small pericardial effusion. The central airways are clear. No mediastinal, hilar or axillary lymphadenopathy. LUNGS AND PLEURA: No focal consolidation or pulmonary edema. No pleural effusion. No pneumothorax. ABDOMEN AND PELVIS: LIVER: Moderate hepatic steatosis. Mild hepatomegaly. Scattered hypodensities within the left hepatic lobe are stable since remote prior examination of 08/23/2017 and are benign, likely multiple cysts. GALLBLADDER AND BILE DUCTS: Status post cholecystectomy. No biliary ductal dilatation. SPLEEN: No acute abnormality. PANCREAS: No acute abnormality. ADRENAL GLANDS: No acute abnormality. KIDNEYS, URETERS AND BLADDER: No stones in the kidneys or ureters. No hydronephrosis. No perinephric or periureteral stranding. Urinary bladder is unremarkable. GI AND BOWEL: Status post appendectomy. The stomach, small bowel, and large bowel are otherwise unremarkable. REPRODUCTIVE ORGANS: Status post hysterectomy. No adnexal masses. PERITONEUM AND RETROPERITONEUM: Umbilical hernia repair with mesh. Small fat-containing supraumbilical ventral hernia just superior to the mesh. No ascites. No free air. VASCULATURE: Aorta is normal in caliber. ABDOMINAL AND  PELVIS LYMPH NODES: No lymphadenopathy. BONES AND SOFT TISSUES: Osseous structures are age appropriate. No acute bone abnormality. No lytic or blastic bone lesion. No focal soft tissue abnormality. IMPRESSION: 1. No acute CT abnormality identified in the chest, abdomen, or pelvis to explain the reported symptoms, including no acute bowel abnormality. 2. Small pericardial effusion. 3. Moderate hepatic steatosis with mild hepatomegaly. 4. Stable benign-appearing left hepatic lobe hypodensities since 08/23/2017, likely multiple cysts. 5. Small fat-containing supraumbilical ventral hernia, just superior to the prior umbilical hernia repair mesh. 6. Status post cholecystectomy, appendectomy, and hysterectomy. Electronically signed by: Dorethia Molt MD 09/02/2024 01:52 AM EST RP Workstation: [id number]   DG Chest Port 1 View Result Date: 09/01/2024 EXAM: 1 VIEW(S) XRAY OF THE CHEST 09/01/2024 11:35:00 PM COMPARISON: 10/06/2023 CLINICAL HISTORY: Questionable sepsis - evaluate for abnormality FINDINGS: LUNGS AND PLEURA: Low lung volumes. Mild pulmonary edema. No focal pulmonary opacity. No pleural effusion. No pneumothorax. HEART AND MEDIASTINUM: Mild cardiomegaly. No acute abnormality of the mediastinal silhouette. BONES AND SOFT TISSUES: No acute osseous abnormality. IMPRESSION: 1. Mild pulmonary edema and mild cardiomegaly. Electronically signed by: Greig Pique MD 09/01/2024 11:59 PM EST RP Workstation: [id number]    EKG: My personal interpretation of EKG shows: Sinus rhythm at 69 bpm no ST elevation    Assessment/Plan Principal Problem:   Hyperosmolar hyperglycemic state (HHS) (HCC) Active Problems:   GERD (gastroesophageal reflux disease)   Hyperlipidemia   Major depressive disorder, single episode   Sleep apnea   COPD (  chronic obstructive pulmonary disease) (HCC)   OSA (obstructive sleep apnea)   Noncompliance    Assessment and Plan: 59 year old female with multiple medical problems including  but not limited to type 2 diabetes, HTN, HLD, COPD, CHF, OSA, GERD, ADHD who has not been on any medications for some time due to affordability issues.  She came in for not feeling well, fatigue and sleepiness and elevated blood sugars.  She is alert awake and oriented feels normal at this point.  1.  Hyperosmolar hyperglycemic state - She has been placed in observation - She was started on insulin  drip and will transition to subcutaneous insulin  once stable. - Extensive counseling was done regarding compliance.  Patient explained that she does not have money to pay and does not have a good schedule from the education. - I see that in the system she takes 62 units Lantus at bedtime but patient says she has not been taking anything for some time - She was given 12 units of insulin  Lantus and sliding scale.  Will continue to monitor - Need to adjust Lantus dose and give her education. -I will have diabetic educator  2.  COPD not in exacerbation - Continue home inhaler as needed  3.  ADHD - Resume her home dose of Adderall  4.  Hypokalemia - Replace potassium and check potassium level in the morning  5.  HTN/HLD - Continue pravastatin  - Continue to monitor blood pressure.  Currently blood pressure appears to be fairly controlled without medications.  6.  Obstructive sleep apnea - Continue CPAP during the night  7.  History of congestive heart failure - Patient used to be on Lasix  for patient's history - She is not appearing to be fluid overloaded - I am not sure if she was taking that at this point. - For now we will not start that but may need to be started at the discharge.  8.  Intermittent hemorrhoidal bleeding - She does not have acute bleeding at this point. - Continue Anusol  cream - Continue to monitor hemoglobin hematocrit - She may need to go to outpatient GI for evaluations    DVT prophylaxis: SCDs Code Status: Full Code Family Communication: Family was at  bedside Disposition Plan: Home Consults called: None Admission status: Observation, Med-Surg   Nena Rebel, MD [facility] 09/02/2024, 9:57 AM        [1]  Allergies Allergen Reactions   Penicillins Anaphylaxis    Has patient had a PCN reaction causing immediate rash, facial/tongue/throat swelling, SOB or lightheadedness with hypotension: Yes Has patient had a PCN reaction causing severe rash involving mucus membranes or skin necrosis: No Has patient had a PCN reaction that required hospitalization No Has patient had a PCN reaction occurring within the last 10 years: No If all of the above answers are NO, then may proceed with Cephalosporin use.  Has patient had a PCN reaction causing immediate rash, facial/tongue/throat swelling, SOB or lightheadedness with hypotension: Yes Has patient had a PCN reaction causing severe rash involving mucus membranes or skin necrosis: No Has patient had a PCN reaction that required hospitalization No Has patient had a PCN reaction occurring within the last 10 years: No If all of the above answers are NO, then may proceed with Cephalosporin use. Other reaction(s): Other (See Comments) Severe/almost died Other reaction(s): ANAPHYLAXIS Has patient had a PCN reaction causing immediate rash, facial/tongue/throat swelling, SOB or lightheadedness with hypotension: Yes Has patient had a PCN reaction causing severe rash involving mucus  membranes or skin necrosis: No Has patient had a PCN reaction that required hospitalization No Has patient had a PCN reaction occurring within the last 10 years: No If all of the above answers are NO, then may proceed with Cephalosporin use. Other reaction(s): Other (See Comments) Severe/almost died Other reaction(s): ANAPHYLAXIS Has patient had a PCN reaction causing immediate rash, facial/tongue/throat swelling, SOB or lightheadedness with hypotension: Yes Has patient had a PCN reaction causing severe rash  involving mucus membranes or skin necrosis: No Has patient had a PCN reaction that required hospitalization No Has patient had a PCN reaction occurring within the last 10 years: No If all of the above answers are NO, then may proceed with Cephalospo... (TRUNCATED)   Penicillins Anaphylaxis   Ace Inhibitors Swelling   Ace Inhibitors Swelling   "

## 2024-09-02 NOTE — ED Notes (Signed)
 Pt provided with breakfast

## 2024-09-02 NOTE — ED Notes (Signed)
 Pt and daughter asking if they can make some changes to Medical Power of [place]. Informed pt and daughter would need to reach out to some other staff and being this early may need to wait until next shift before someone could help. Pt and daughter ok with this plan.

## 2024-09-02 NOTE — Plan of Care (Signed)

## 2024-09-02 NOTE — ED Notes (Signed)
 Pt assisted to bathroom. Ambulated well w/ min assist

## 2024-09-03 ENCOUNTER — Other Ambulatory Visit: Payer: Self-pay

## 2024-09-03 ENCOUNTER — Encounter: Payer: Self-pay | Admitting: Oncology

## 2024-09-03 ENCOUNTER — Telehealth (HOSPITAL_COMMUNITY): Payer: Self-pay

## 2024-09-03 ENCOUNTER — Other Ambulatory Visit (HOSPITAL_COMMUNITY): Payer: Self-pay

## 2024-09-03 DIAGNOSIS — N179 Acute kidney failure, unspecified: Secondary | ICD-10-CM

## 2024-09-03 DIAGNOSIS — R7989 Other specified abnormal findings of blood chemistry: Secondary | ICD-10-CM | POA: Diagnosis not present

## 2024-09-03 DIAGNOSIS — J439 Emphysema, unspecified: Secondary | ICD-10-CM

## 2024-09-03 DIAGNOSIS — F909 Attention-deficit hyperactivity disorder, unspecified type: Secondary | ICD-10-CM | POA: Insufficient documentation

## 2024-09-03 DIAGNOSIS — E876 Hypokalemia: Secondary | ICD-10-CM

## 2024-09-03 DIAGNOSIS — K760 Fatty (change of) liver, not elsewhere classified: Secondary | ICD-10-CM | POA: Diagnosis not present

## 2024-09-03 DIAGNOSIS — G894 Chronic pain syndrome: Secondary | ICD-10-CM | POA: Diagnosis not present

## 2024-09-03 DIAGNOSIS — G4733 Obstructive sleep apnea (adult) (pediatric): Secondary | ICD-10-CM

## 2024-09-03 DIAGNOSIS — E669 Obesity, unspecified: Secondary | ICD-10-CM | POA: Diagnosis not present

## 2024-09-03 DIAGNOSIS — K649 Unspecified hemorrhoids: Secondary | ICD-10-CM

## 2024-09-03 DIAGNOSIS — E1100 Type 2 diabetes mellitus with hyperosmolarity without nonketotic hyperglycemic-hyperosmolar coma (NKHHC): Secondary | ICD-10-CM | POA: Diagnosis not present

## 2024-09-03 DIAGNOSIS — E785 Hyperlipidemia, unspecified: Secondary | ICD-10-CM

## 2024-09-03 DIAGNOSIS — G8929 Other chronic pain: Secondary | ICD-10-CM | POA: Insufficient documentation

## 2024-09-03 DIAGNOSIS — I5032 Chronic diastolic (congestive) heart failure: Secondary | ICD-10-CM | POA: Diagnosis not present

## 2024-09-03 LAB — COMPREHENSIVE METABOLIC PANEL WITH GFR
ALT: 77 U/L — ABNORMAL HIGH (ref 0–44)
AST: 47 U/L — ABNORMAL HIGH (ref 15–41)
Albumin: 3.9 g/dL (ref 3.5–5.0)
Alkaline Phosphatase: 153 U/L — ABNORMAL HIGH (ref 38–126)
Anion gap: 11 (ref 5–15)
BUN: 8 mg/dL (ref 6–20)
CO2: 24 mmol/L (ref 22–32)
Calcium: 9.2 mg/dL (ref 8.9–10.3)
Chloride: 102 mmol/L (ref 98–111)
Creatinine, Ser: 0.62 mg/dL (ref 0.44–1.00)
GFR, Estimated: >60 mL/min
Glucose, Bld: 233 mg/dL — ABNORMAL HIGH (ref 70–99)
Potassium: 4 mmol/L (ref 3.5–5.1)
Sodium: 138 mmol/L (ref 135–145)
Total Bilirubin: 0.4 mg/dL (ref 0.0–1.2)
Total Protein: 6.7 g/dL (ref 6.5–8.1)

## 2024-09-03 LAB — GLUCOSE, CAPILLARY: Glucose-Capillary: 200 mg/dL — ABNORMAL HIGH (ref 70–99)

## 2024-09-03 LAB — CBC
HCT: 37.8 % (ref 36.0–46.0)
Hemoglobin: 12.7 g/dL (ref 12.0–15.0)
MCH: 27.9 pg (ref 26.0–34.0)
MCHC: 33.6 g/dL (ref 30.0–36.0)
MCV: 82.9 fL (ref 80.0–100.0)
Platelets: 216 K/uL (ref 150–400)
RBC: 4.56 MIL/uL (ref 3.87–5.11)
RDW: 14.1 % (ref 11.5–15.5)
WBC: 7.3 K/uL (ref 4.0–10.5)
nRBC: 0 % (ref 0.0–0.2)

## 2024-09-03 LAB — HIV ANTIBODY (ROUTINE TESTING W REFLEX): HIV Screen 4th Generation wRfx: NONREACTIVE

## 2024-09-03 LAB — PROTIME-INR
INR: 0.9 (ref 0.8–1.2)
Prothrombin Time: 13 s (ref 11.4–15.2)

## 2024-09-03 MED ORDER — INSULIN PEN NEEDLE 32G X 4 MM MISC
1.0000 | Freq: Three times a day (TID) | 0 refills | Status: AC
  Filled 2024-09-03: qty 200, 66d supply, fill #0

## 2024-09-03 MED ORDER — INSULIN GLARGINE 100 UNIT/ML ~~LOC~~ SOLN
15.0000 [IU] | Freq: Every day | SUBCUTANEOUS | Status: DC
  Administered 2024-09-03: 15 [IU] via SUBCUTANEOUS
  Filled 2024-09-03: qty 0.15

## 2024-09-03 MED ORDER — POLYETHYLENE GLYCOL 3350 17 GM/SCOOP PO POWD
17.0000 g | Freq: Every day | ORAL | 0 refills | Status: AC | PRN
  Filled 2024-09-03: qty 238, 14d supply, fill #0

## 2024-09-03 MED ORDER — INSULIN ASPART 100 UNIT/ML IJ SOLN: 3.0000 [IU] | Freq: Three times a day (TID) | INTRAMUSCULAR | Status: DC

## 2024-09-03 MED ORDER — INSULIN GLARGINE 100 UNIT/ML SOLOSTAR PEN
15.0000 [IU] | PEN_INJECTOR | Freq: Every day | SUBCUTANEOUS | 0 refills | Status: DC
  Filled 2024-09-03: qty 15, 100d supply, fill #0

## 2024-09-03 MED ORDER — INSULIN LISPRO (1 UNIT DIAL) 100 UNIT/ML (KWIKPEN)
3.0000 [IU] | PEN_INJECTOR | Freq: Three times a day (TID) | SUBCUTANEOUS | 0 refills | Status: AC
  Filled 2024-09-03: qty 9, 100d supply, fill #0

## 2024-09-03 MED ORDER — HYDROCORTISONE ACETATE 25 MG RE SUPP
25.0000 mg | Freq: Two times a day (BID) | RECTAL | 0 refills | Status: AC
  Filled 2024-09-03: qty 12, 6d supply, fill #0

## 2024-09-03 NOTE — Assessment & Plan Note (Signed)
 On pravastatin.

## 2024-09-03 NOTE — Assessment & Plan Note (Signed)
 Class II with a BMI of 36.05

## 2024-09-03 NOTE — Assessment & Plan Note (Addendum)
 With intermittent bleeding.  Anusol  suppositories.  Refer to gastroenterology as outpatient.  Hemoglobin 12.7 upon discharge.

## 2024-09-03 NOTE — Assessment & Plan Note (Signed)
 Replaced

## 2024-09-03 NOTE — Telephone Encounter (Signed)
 Pharmacy Patient Advocate Encounter  Insurance verification completed.    The patient is insured through [facility]. Patient has Medicare and is not eligible for a copay card, but may be able to apply for patient assistance or Medicare RX Payment Plan (Patient Must reach out to their plan, if eligible for payment plan), if available.    Ran test claim for Lantus 100unit Pen and the current 30 day co-pay is $4.90  Ran test claim for Novolog  100unit Pen and the current 30 day co-pay is $4.90  This test claim was processed through [employer]- copay amounts may vary at other pharmacies due to [employer], or as the patient moves through the different stages of their insurance plan.

## 2024-09-03 NOTE — Assessment & Plan Note (Addendum)
 Patient initially started on insulin  drip.  Patient seen by diabetes coordinator recommended 15 units of long-acting insulin  and 3 units short acting insulin  plus sliding scale.  Lantus and Humalog prescribed into [facility] pharmacy along with pen needles.  Patient has a Dexcom to monitor her sugars.  Patient needs to be compliant with taking her insulin  while she will end up back in the hospital.

## 2024-09-03 NOTE — Assessment & Plan Note (Signed)
 -  CPAP at night

## 2024-09-03 NOTE — Assessment & Plan Note (Signed)
 No signs of heart failure currently.  Last EF I see in the computer is 50% back in 2021.

## 2024-09-03 NOTE — Hospital Course (Signed)
 59 y.o. female with medical history significant for type 2 diabetes not on any medication, CHF, iron  deficiency anemia, previous GI bleeding, chronic fatigue syndrome, HTN, hypothyroidism came in for generalized fatigue, generalized weakness, high blood sugars and fell asleep in the car while getting food with her son.  She was difficult to arouse and so they called EMS.  EMS reports that in the route she is responsive but still obvious and answering questions appropriately. Patient is alert awake and oriented on my exam.  She stated that due to her problem with buying insulin , she has not been using any medications.  She stated that she has some mild cough over the last few days generalized weakness, not feeling well and some chest discomfort, abdominal pain and also intermittent bleeding from her hemorrhoid.  She does not take any blood thinners.  She also reports that she has been thirsty and urinating a lot.  She denies any fever, chills, nausea, vomiting, headache, chest pain, palpitations, dysuria, hematuria.   ED Course: Upon arrival to the ED, patient is found to be alert awake and oriented.  Lactic acid was 2.1, sodium 126, potassium 5.2, glucose 680, creatinine 1.16, calcium  8.6, urine analysis did not show any signs of acute urinary tract infection, chest x-ray showed mild pulmonary congestion, no pneumonia, CT chest abdomen and pelvis did not show any acute pathology.  She has small pericardial effusion.  Patient was started on insulin  drip and hospitalist service was consulted for evaluation for admission for diabetes control.  09/03/2024.  Patient seen by diabetes coordinator recommended Lantus 15 units daily and 3 units plus sliding scale prior to meals.  Patient stated that she ran out of insulin .  I advised that she needs to take her insulin  in order to avoid coming into the hospital.  Insulin  medication sent into [facility] pharmacy.

## 2024-09-03 NOTE — Assessment & Plan Note (Signed)
 Secondary to dehydration with high sugar.  Creatinine 1.16 on presentation and down to 0.62

## 2024-09-03 NOTE — Discharge Summary (Signed)
 " Physician Discharge Summary   Patient: Amanda [name] MRN: [id number] DOB: September 23, 1965  Admit date:     09/01/2024  Discharge date: 09/03/2024  Discharge Physician: Charlie Patterson   PCP: Lorel Maxie LABOR, MD   Recommendations at discharge:   Follow-up PCP 5 days  Discharge Diagnoses: Principal Problem:   Hyperosmolar hyperglycemic state (HHS) (HCC) Active Problems:   AKI (acute kidney injury)   Hypokalemia   Pseudohyponatremia   GERD (gastroesophageal reflux disease)   Hyperlipidemia   Major depressive disorder, single episode   Sleep apnea   Chronic diastolic CHF (congestive heart failure) (HCC)   COPD (chronic obstructive pulmonary disease) (HCC)   OSA (obstructive sleep apnea)   Noncompliance   ADHD   Chronic pain   Hepatic steatosis   Obesity (BMI 30-39.9)   Hemorrhoids   Hospital Course: 59 y.o. female with medical history significant for type 2 diabetes not on any medication, CHF, iron  deficiency anemia, previous GI bleeding, chronic fatigue syndrome, HTN, hypothyroidism came in for generalized fatigue, generalized weakness, high blood sugars and fell asleep in the car while getting food with her son.  She was difficult to arouse and so they called EMS.  EMS reports that in the route she is responsive but still obvious and answering questions appropriately. Patient is alert awake and oriented on my exam.  She stated that due to her problem with buying insulin , she has not been using any medications.  She stated that she has some mild cough over the last few days generalized weakness, not feeling well and some chest discomfort, abdominal pain and also intermittent bleeding from her hemorrhoid.  She does not take any blood thinners.  She also reports that she has been thirsty and urinating a lot.  She denies any fever, chills, nausea, vomiting, headache, chest pain, palpitations, dysuria, hematuria.   ED Course: Upon arrival to the ED, patient is found to be alert awake and  oriented.  Lactic acid was 2.1, sodium 126, potassium 5.2, glucose 680, creatinine 1.16, calcium  8.6, urine analysis did not show any signs of acute urinary tract infection, chest x-ray showed mild pulmonary congestion, no pneumonia, CT chest abdomen and pelvis did not show any acute pathology.  She has small pericardial effusion.  Patient was started on insulin  drip and hospitalist service was consulted for evaluation for admission for diabetes control.  09/03/2024.  Patient seen by diabetes coordinator recommended Lantus 15 units daily and 3 units plus sliding scale prior to meals.  Patient stated that she ran out of insulin .  I advised that she needs to take her insulin  in order to avoid coming into the hospital.  Insulin  medication sent into [facility] pharmacy.  Assessment and Plan: * Hyperosmolar hyperglycemic state (HHS) (HCC) Patient initially started on insulin  drip.  Patient seen by diabetes coordinator recommended 15 units of long-acting insulin  and 3 units short acting insulin  plus sliding scale.  Lantus and Humalog prescribed into [facility] pharmacy along with pen needles.  Patient has a Dexcom to monitor her sugars.  Patient needs to be compliant with taking her insulin  while she will end up back in the hospital.  AKI (acute kidney injury) Secondary to dehydration with high sugar.  Creatinine 1.16 on presentation and down to 0.62  Hypokalemia Replaced  Pseudohyponatremia Secondary to high sugar  Hemorrhoids With intermittent bleeding.  Anusol  suppositories.  Refer to gastroenterology as outpatient.  Hemoglobin 12.7 upon discharge.  Obesity (BMI 30-39.9) Class II with a BMI of 36.05  Hepatic steatosis Seen  on CT scan.  Also has elevated liver function test.  Chronic pain Follow-up with her pain management doctor for prescriptions.  ADHD On Adderall  COPD (chronic obstructive pulmonary disease) (HCC) Respiratory status stable  Chronic diastolic CHF (congestive heart failure)  (HCC) No signs of heart failure currently.  Last EF I see in the computer is 50% back in 2021.  Sleep apnea CPAP at night  Hyperlipidemia On pravastatin          Consultants: Diabetes coordinator Procedures performed: None Disposition: Home Diet recommendation:  Cardiac and Carb modified diet DISCHARGE MEDICATION: Allergies as of 09/03/2024       Reactions   Penicillins Anaphylaxis   Has patient had a PCN reaction causing immediate rash, facial/tongue/throat swelling, SOB or lightheadedness with hypotension: Yes Has patient had a PCN reaction causing severe rash involving mucus membranes or skin necrosis: No Has patient had a PCN reaction that required hospitalization No Has patient had a PCN reaction occurring within the last 10 years: No If all of the above answers are NO, then may proceed with Cephalosporin use. Has patient had a PCN reaction causing immediate rash, facial/tongue/throat swelling, SOB or lightheadedness with hypotension: Yes Has patient had a PCN reaction causing severe rash involving mucus membranes or skin necrosis: No Has patient had a PCN reaction that required hospitalization No Has patient had a PCN reaction occurring within the last 10 years: No If all of the above answers are NO, then may proceed with Cephalosporin use. Other reaction(s): Other (See Comments) Severe/almost died Other reaction(s): ANAPHYLAXIS Has patient had a PCN reaction causing immediate rash, facial/tongue/throat swelling, SOB or lightheadedness with hypotension: Yes Has patient had a PCN reaction causing severe rash involving mucus membranes or skin necrosis: No Has patient had a PCN reaction that required hospitalization No Has patient had a PCN reaction occurring within the last 10 years: No If all of the above answers are NO, then may proceed with Cephalosporin use. Other reaction(s): Other (See Comments) Severe/almost died Other reaction(s): ANAPHYLAXIS Has patient  had a PCN reaction causing immediate rash, facial/tongue/throat swelling, SOB or lightheadedness with hypotension: Yes Has patient had a PCN reaction causing severe rash involving mucus membranes or skin necrosis: No Has patient had a PCN reaction that required hospitalization No Has patient had a PCN reaction occurring within the last 10 years: No If all of the above answers are NO, then may proceed with Cephalospo... (TRUNCATED)   Penicillins Anaphylaxis   Ace Inhibitors Swelling   Ace Inhibitors Swelling        Medication List     STOP taking these medications    Belsomra 10 MG Tabs Generic drug: Suvorexant   Dexmethylphenidate  HCl 25 MG Cp24   Jardiance 25 MG Tabs tablet Generic drug: empagliflozin   TOUJEO SOLOSTAR [place] Replaced by: Lantus SoloStar 100 UNIT/ML Solostar Pen   Vitamin C  CR 1000 MG Tbcr       TAKE these medications    albuterol  108 (90 Base) MCG/ACT inhaler Commonly known as: VENTOLIN  HFA Inhale 2 puffs into the lungs every 6 (six) hours as needed for wheezing or shortness of breath. What changed: Another medication with the same name was removed. Continue taking this medication, and follow the directions you see here.   amphetamine-dextroamphetamine 20 MG 24 hr capsule Commonly known as: ADDERALL XR Take 20 mg by mouth every morning.   buprenorphine 10 MCG/HR Ptwk Commonly known as: BUTRANS 1 patch once a week.   desvenlafaxine 50 MG 24 hr  tablet Commonly known as: PRISTIQ Take 50 mg by mouth daily.   estradiol 0.1 MG/GM vaginal cream Commonly known as: ESTRACE   Eszopiclone 3 MG Tabs Take 3 mg by mouth at bedtime.   flunisolide 25 MCG/ACT (0.025%) Soln Commonly known as: NASALIDE Place into the nose.   furosemide  40 MG tablet Commonly known as: LASIX  Take 1 tablet (40 mg total) by mouth 2 (two) times daily.   hydrocortisone  2.5 % ointment Apply topically 2 (two) times daily.   hydrocortisone  25 MG suppository Commonly known  as: ANUSOL -HC Place 1 suppository (25 mg total) rectally 2 (two) times daily.   hydrOXYzine 100 MG capsule Commonly known as: VISTARIL Take 100 mg by mouth at bedtime.   hydrOXYzine 25 MG tablet Commonly known as: ATARAX Take 25 mg by mouth daily.   insulin  lispro 100 UNIT/ML KwikPen Commonly known as: HUMALOG Inject 3 Units into the skin 3 (three) times daily with meals. If eating and Blood Glucose (BG) 80 or higher inject 3 units for meal coverage and add correction dose per scale. If not eating, correction dose only. BG <150= 0 unit; BG 150-200= 1 unit; BG 201-250= 2 unit; BG 251-300= 3 unit; BG 301-350= 4 unit; BG 351-400= 5 unit; BG >400= 6 unit and Call Primary Care. What changed:  how much to take when to take this additional instructions   Insupen Pen Needles 32G X 4 MM Misc Generic drug: Insulin  Pen Needle Use 3 (three) times daily. What changed:  medication strength how much to take how to take this when to take this Another medication with the same name was removed. Continue taking this medication, and follow the directions you see here.   Lantus SoloStar 100 UNIT/ML Solostar Pen Generic drug: insulin  glargine Inject 15 Units into the skin daily. Replaces: TOUJEO SOLOSTAR [place]   Linzess  290 MCG Caps capsule Generic drug: linaclotide  Take 290 mcg by mouth daily.   LORazepam  1 MG tablet Commonly known as: ATIVAN  Take 1 mg by mouth 2 (two) times daily as needed for anxiety.   meloxicam  15 MG tablet Commonly known as: MOBIC  Take 15 mg by mouth daily.   mirtazapine  30 MG tablet Commonly known as: REMERON  Take 30 mg by mouth at bedtime. What changed: Another medication with the same name was removed. Continue taking this medication, and follow the directions you see here.   montelukast  10 MG tablet Commonly known as: SINGULAIR  Take 10 mg by mouth at bedtime.   omeprazole  40 MG capsule Commonly known as: PRILOSEC Take 40 mg by mouth daily.   ondansetron  4  MG disintegrating tablet Commonly known as: ZOFRAN -ODT Take 1 tablet (4 mg total) by mouth every 6 (six) hours as needed for nausea or vomiting.   Ozempic (1 MG/DOSE) 2 MG/1.5ML Sopn Generic drug: Semaglutide (1 MG/DOSE) Inject 2 mg into the skin once a week. On Fridays   polyethylene glycol powder 17 GM/SCOOP powder Commonly known as: GLYCOLAX /MIRALAX  Take 17 g by mouth daily as needed for mild constipation. Dissolve 1 capful (17g) in 4-8 ounces of liquid and take by mouth daily.   potassium chloride  SA 20 MEQ tablet Commonly known as: KLOR-CON  M Take 1 tablet (20 mEq total) by mouth daily.   pravastatin  40 MG tablet Commonly known as: PRAVACHOL  Take 40 mg by mouth daily.   pregabalin  150 MG capsule Commonly known as: LYRICA  Take 150 mg by mouth 3 (three) times daily.   propranolol  ER 80 MG 24 hr capsule Commonly known as: INDERAL   LA Take 1 capsule (80 mg total) by mouth daily.   Spiriva  HandiHaler 18 MCG Caps Generic drug: Tiotropium Bromide  Place 1 capsule (18 mcg total) into inhaler and inhale daily.   SUMAtriptan  50 MG tablet Commonly known as: Imitrex  Take 1 tablet (50 mg total) by mouth every 2 (two) hours as needed for migraine. May repeat in 2 hours if headache persists or recurs.  Not to exceed 4 pills in a 24-hour period.  If you find you need more medication and you have taken 4 pills, please go directly to the emergency room or call 911.   tiZANidine  4 MG tablet Commonly known as: ZANAFLEX  Take 4 mg by mouth every 8 (eight) hours.   topiramate  100 MG tablet Commonly known as: TOPAMAX  Take 100 mg by mouth 2 (two) times daily.   VITAMIN D  (CHOLECALCIFEROL ) PO Take 2,000 Units by mouth daily.   Vraylar  4.5 MG Caps Generic drug: Cariprazine  HCl Take 1 capsule by mouth daily. What changed: Another medication with the same name was removed. Continue taking this medication, and follow the directions you see here.   Xenical 120 MG capsule Generic drug:  orlistat Take 1 capsule by mouth 3 (three) times daily.        Follow-up Information     Aycock, Ngwe A, MD Follow up in 5 day(s).   Specialty: Family Medicine Contact information: [address] [place] [address] [phone]         Unk Corinn Skiff, MD Follow up in 2 week(s).   Specialty: Gastroenterology Contact information: [address] [place] [address] [phone]                Discharge Exam: Fredricka Weights   09/02/24 0003  Weight: 95.3 kg   Physical Exam HENT:     Head: Normocephalic.  Eyes:     General: Lids are normal.  Cardiovascular:     Rate and Rhythm: Normal rate and regular rhythm.     Heart sounds: Normal heart sounds, S1 normal and S2 normal.  Pulmonary:     Breath sounds: No decreased breath sounds, wheezing, rhonchi or rales.  Abdominal:     Palpations: Abdomen is soft.     Tenderness: There is no abdominal tenderness.  Musculoskeletal:     Right lower leg: No swelling.     Left lower leg: No swelling.  Skin:    General: Skin is warm.     Findings: No rash.  Neurological:     Mental Status: She is alert and oriented to person, place, and time.      Condition at discharge: stable  The results of significant diagnostics from this hospitalization (including imaging, microbiology, ancillary and laboratory) are listed below for reference.   Imaging Studies: CT CHEST ABDOMEN PELVIS W CONTRAST Result Date: 09/02/2024 EXAM: CT CHEST, ABDOMEN AND PELVIS WITH CONTRAST 09/02/2024 01:29:45 AM TECHNIQUE: CT of the chest, abdomen and pelvis was performed with the administration of 100 mL of iohexol  (OMNIPAQUE ) 300 MG/ML solution intravenous contrast. Multiplanar reformatted images are provided for review. Automated exposure control, iterative reconstruction, and/or weight based adjustment of the mA/kV was utilized to reduce the radiation dose to as low as reasonably achievable. COMPARISON: Remote prior examination  of 08/23/2017. CLINICAL HISTORY: resp infection sx, hypotension, and abd pain w LGIB FINDINGS: CHEST: MEDIASTINUM AND LYMPH NODES: Small pericardial effusion. The central airways are clear. No mediastinal, hilar or axillary lymphadenopathy. LUNGS AND PLEURA: No focal consolidation or pulmonary edema. No  pleural effusion. No pneumothorax. ABDOMEN AND PELVIS: LIVER: Moderate hepatic steatosis. Mild hepatomegaly. Scattered hypodensities within the left hepatic lobe are stable since remote prior examination of 08/23/2017 and are benign, likely multiple cysts. GALLBLADDER AND BILE DUCTS: Status post cholecystectomy. No biliary ductal dilatation. SPLEEN: No acute abnormality. PANCREAS: No acute abnormality. ADRENAL GLANDS: No acute abnormality. KIDNEYS, URETERS AND BLADDER: No stones in the kidneys or ureters. No hydronephrosis. No perinephric or periureteral stranding. Urinary bladder is unremarkable. GI AND BOWEL: Status post appendectomy. The stomach, small bowel, and large bowel are otherwise unremarkable. REPRODUCTIVE ORGANS: Status post hysterectomy. No adnexal masses. PERITONEUM AND RETROPERITONEUM: Umbilical hernia repair with mesh. Small fat-containing supraumbilical ventral hernia just superior to the mesh. No ascites. No free air. VASCULATURE: Aorta is normal in caliber. ABDOMINAL AND PELVIS LYMPH NODES: No lymphadenopathy. BONES AND SOFT TISSUES: Osseous structures are age appropriate. No acute bone abnormality. No lytic or blastic bone lesion. No focal soft tissue abnormality. IMPRESSION: 1. No acute CT abnormality identified in the chest, abdomen, or pelvis to explain the reported symptoms, including no acute bowel abnormality. 2. Small pericardial effusion. 3. Moderate hepatic steatosis with mild hepatomegaly. 4. Stable benign-appearing left hepatic lobe hypodensities since 08/23/2017, likely multiple cysts. 5. Small fat-containing supraumbilical ventral hernia, just superior to the prior umbilical hernia  repair mesh. 6. Status post cholecystectomy, appendectomy, and hysterectomy. Electronically signed by: Dorethia Molt MD 09/02/2024 01:52 AM EST RP Workstation: [id number]   DG Chest Port 1 View Result Date: 09/01/2024 EXAM: 1 VIEW(S) XRAY OF THE CHEST 09/01/2024 11:35:00 PM COMPARISON: 10/06/2023 CLINICAL HISTORY: Questionable sepsis - evaluate for abnormality FINDINGS: LUNGS AND PLEURA: Low lung volumes. Mild pulmonary edema. No focal pulmonary opacity. No pleural effusion. No pneumothorax. HEART AND MEDIASTINUM: Mild cardiomegaly. No acute abnormality of the mediastinal silhouette. BONES AND SOFT TISSUES: No acute osseous abnormality. IMPRESSION: 1. Mild pulmonary edema and mild cardiomegaly. Electronically signed by: Greig Pique MD 09/01/2024 11:59 PM EST RP Workstation: [id number]    Microbiology: Results for orders placed or performed during the hospital encounter of 09/01/24  Blood Culture (routine x 2)     Status: None (Preliminary result)   Collection Time: 09/01/24 11:47 PM   Specimen: BLOOD  Result Value Ref Range Status   Specimen Description BLOOD BLOOD LEFT ARM  Final   Special Requests   Final    BOTTLES DRAWN AEROBIC AND ANAEROBIC Blood Culture results may not be optimal due to an inadequate volume of blood received in culture bottles   Culture   Final    NO GROWTH 1 DAY Performed at [facility], [address], [place], [place] [address]    Report Status PENDING  Incomplete  Blood Culture (routine x 2)     Status: None (Preliminary result)   Collection Time: 09/01/24 11:47 PM   Specimen: BLOOD RIGHT ARM  Result Value Ref Range Status   Specimen Description BLOOD RIGHT ARM  Final   Special Requests   Final    BOTTLES DRAWN AEROBIC AND ANAEROBIC Blood Culture results may not be optimal due to an inadequate volume of blood received in culture bottles   Culture   Final    NO GROWTH 1 DAY Performed at [facility], [address] [place], [place]  [address]    Report Status PENDING  Incomplete  Resp panel by RT-PCR (RSV, Flu A&B, Covid) Anterior Nasal Swab     Status: None   Collection Time: 09/01/24 11:47 PM   Specimen: Anterior Nasal Swab  Result Value Ref Range Status   SARS Coronavirus 2 by RT PCR NEGATIVE NEGATIVE Final    Comment: (NOTE) SARS-CoV-2 target nucleic acids are NOT DETECTED.  The SARS-CoV-2 RNA is generally detectable in upper respiratory specimens during the acute phase of infection. The lowest concentration of SARS-CoV-2 viral copies this assay can detect is 138 copies/mL. A negative result does not preclude SARS-Cov-2 infection and should not be used as the sole basis for treatment or other patient management decisions. A negative result may occur with  improper specimen collection/handling, submission of specimen other than nasopharyngeal swab, presence of viral mutation(s) within the areas targeted by this assay, and inadequate number of viral copies(<138 copies/mL). A negative result must be combined with clinical observations, patient history, and epidemiological information. The expected result is Negative.  Fact Sheet for Patients:  bloggercourse.com  Fact Sheet for Healthcare Providers:  seriousbroker.it  This test is no t yet approved or cleared by the [place]  FDA and  has been authorized for detection and/or diagnosis of SARS-CoV-2 by FDA under an Emergency Use Authorization (EUA). This EUA will remain  in effect (meaning this test can be used) for the duration of the COVID-19 declaration under Section 564(b)(1) of the Act, 21 U.S.C.section 360bbb-3(b)(1), unless the authorization is terminated  or revoked sooner.       Influenza A by PCR NEGATIVE NEGATIVE Final   Influenza B by PCR NEGATIVE NEGATIVE Final    Comment: (NOTE) The Xpert Xpress SARS-CoV-2/FLU/RSV plus assay is intended as an aid in the diagnosis of influenza from  Nasopharyngeal swab specimens and should not be used as a sole basis for treatment. Nasal washings and aspirates are unacceptable for Xpert Xpress SARS-CoV-2/FLU/RSV testing.  Fact Sheet for Patients: bloggercourse.com  Fact Sheet for Healthcare Providers: seriousbroker.it  This test is not yet approved or cleared by the [place]  FDA and has been authorized for detection and/or diagnosis of SARS-CoV-2 by FDA under an Emergency Use Authorization (EUA). This EUA will remain in effect (meaning this test can be used) for the duration of the COVID-19 declaration under Section 564(b)(1) of the Act, 21 U.S.C. section 360bbb-3(b)(1), unless the authorization is terminated or revoked.     Resp Syncytial Virus by PCR NEGATIVE NEGATIVE Final    Comment: (NOTE) Fact Sheet for Patients: bloggercourse.com  Fact Sheet for Healthcare Providers: seriousbroker.it  This test is not yet approved or cleared by the [place]  FDA and has been authorized for detection and/or diagnosis of SARS-CoV-2 by FDA under an Emergency Use Authorization (EUA). This EUA will remain in effect (meaning this test can be used) for the duration of the COVID-19 declaration under Section 564(b)(1) of the Act, 21 U.S.C. section 360bbb-3(b)(1), unless the authorization is terminated or revoked.  Performed at [facility], [address] [place], [place] [address]     Labs: CBC: Recent Labs  Lab 09/01/24 2347 09/03/24 0434  WBC 6.0 7.3  NEUTROABS 3.3  --   HGB 11.6* 12.7  HCT 36.2 37.8  MCV 87.2 82.9  PLT 157 216   Basic Metabolic Panel: Recent Labs  Lab 09/01/24 2347 09/02/24 0435 09/03/24 0434  NA 126* 138 138  K 5.2* 3.1* 4.0  CL 90* 101 102  CO2 23 29 24   GLUCOSE 680* 156* 233*  BUN 18 14 8   CREATININE 1.16* 0.91 0.62  CALCIUM  8.6* 9.2 9.2  MG 1.9  --   --    Liver Function  Tests: Recent Labs  Lab 09/01/24  2347 09/03/24 0434  AST 67* 47*  ALT 88* 77*  ALKPHOS 183* 153*  BILITOT 0.2 0.4  PROT 6.1* 6.7  ALBUMIN 3.6 3.9   CBG: Recent Labs  Lab 09/02/24 0922 09/02/24 1149 09/02/24 1648 09/02/24 2151 09/03/24 0756  GLUCAP 160* 230* 187* 200* 200*    Discharge time spent: greater than 30 minutes.  Signed: Charlie Patterson, MD [facility] 09/03/2024 "

## 2024-09-03 NOTE — Assessment & Plan Note (Signed)
On Adderall 

## 2024-09-03 NOTE — Assessment & Plan Note (Signed)
Respiratory status stable. 

## 2024-09-03 NOTE — Assessment & Plan Note (Signed)
 Follow-up with her pain management doctor for prescriptions.

## 2024-09-03 NOTE — TOC CM/SW Note (Signed)
 Transition of Care [facility]) - Inpatient Brief Assessment   Patient Details  Name: Johara Lodwick MRN: [id number] Date of Birth: Sep 08, 1965  Transition of Care [facility]) CM/SW Contact:    Daved JONETTA Hamilton, RN Phone Number: 09/03/2024, 10:41 AM   Clinical Narrative:   Transition of Care (TOC) Screening Note   Patient Details  Name: Teea Ducey Date of Birth: 05/30/1966   Transition of Care [facility]) CM/SW Contact:    Daved JONETTA Hamilton, RN Phone Number: 09/03/2024, 10:42 AM    Transition of Care Department [facility]) has reviewed patient and no TOC needs have been identified at this time. If new patient transition needs arise, please place a TOC consult.    Transition of Care Asessment: Insurance and Status: Insurance coverage has been reviewed Patient has primary care physician: Yes   Prior level of function:: Independent   Social Drivers of Health Review: SDOH reviewed no interventions necessary Readmission risk has been reviewed: No (Patient is in Observation status, no score generated) Transition of care needs: no transition of care needs at this time

## 2024-09-03 NOTE — Inpatient Diabetes Management (Addendum)
 Inpatient Diabetes Program Recommendations  AACE/ADA: New Consensus Statement on Inpatient Glycemic Control (2015)  Target Ranges:  Prepandial:   less than 140 mg/dL      Peak postprandial:   less than 180 mg/dL (1-2 hours)      Critically ill patients:  140 - 180 mg/dL    Latest Reference Range & Units 09/01/24 23:47  Glucose 70 - 99 mg/dL 319 (HH)  (HH): Data is critically high  Latest Reference Range & Units 09/02/24 03:31 09/02/24 04:31 09/02/24 05:32 09/02/24 06:47 09/02/24 07:42 09/02/24 09:22 09/02/24 11:49 09/02/24 16:48 09/02/24 21:51  Glucose-Capillary 70 - 99 mg/dL 790 (H)  IV Insulin  Drip Running 148 (H) 119 (H)  12 units Lantus @0612  125 (H) 120 (H)  IV Insulin  Drip Stopped 160 (H) 230 (H)  5 units Novolog   187 (H)  3 units Novolog   200 (H)  (H): Data is abnormally high  Latest Reference Range & Units 09/03/24 07:56  Glucose-Capillary 70 - 99 mg/dL 799 (H)  (H): Data is abnormally high    Admit with: Hyperosmolar hyperglycemic state  Has NOT been taking Insulin  due to financial issues  History: DM, CHF  Home DM Meds: Humalog 10 units Breakfast/ 10 units Lunch/ 15 units Dinner       Toujeo 62 units daily       Jardiance 25 mg daily       Ozempic 2 mg Qweek  Current Orders: Novolog  Moderate Correction Scale/ SSI (0-15 units) TID AC + HS     Lantus 12 units daily    Pt has NOT been taking Insulin  due to financial issues? Will ask OP Pharmacy to check cost of Insulin  Transitioned to SQ Insulin  yesterday   MD- Note CBG 200 this AM  Please consider:  1. Increase Lantus to 15 units daily  2. Start Novolog  Meal Coverage: Novolog  3 units TID with meals HOLD if pt NPO HOLD if pt eats <50% meals  3. Add on Current A1c Level    Addendum 12:30pm--Attempted to see pt prior to d/c to discuss admission glucose, issues with insulin  affordability, etc.  Pt had already d/c'd home when I got to room today.     --Will follow patient during  hospitalization--  Adina Rudolpho Arrow RN, MSN, CDCES Diabetes Coordinator Inpatient Glycemic Control Team Team Pager: [phone] (8a-5p)

## 2024-09-03 NOTE — Plan of Care (Signed)

## 2024-09-03 NOTE — Assessment & Plan Note (Signed)
 Secondary to high sugar

## 2024-09-03 NOTE — Assessment & Plan Note (Signed)
 Seen on CT scan.  Also has elevated liver function test.

## 2024-09-07 LAB — CULTURE, BLOOD (ROUTINE X 2)
Culture: NO GROWTH
Culture: NO GROWTH

## 2024-09-17 ENCOUNTER — Encounter: Payer: Self-pay | Admitting: Oncology

## 2024-09-21 ENCOUNTER — Other Ambulatory Visit: Payer: Self-pay

## 2024-09-21 ENCOUNTER — Emergency Department

## 2024-09-21 ENCOUNTER — Observation Stay: Admission: EM | Admit: 2024-09-21 | Discharge: 2024-09-24 | Disposition: A | Discharge status: Home / Self Care

## 2024-09-21 DIAGNOSIS — E039 Hypothyroidism, unspecified: Secondary | ICD-10-CM | POA: Insufficient documentation

## 2024-09-21 DIAGNOSIS — M797 Fibromyalgia: Secondary | ICD-10-CM | POA: Insufficient documentation

## 2024-09-21 DIAGNOSIS — E1169 Type 2 diabetes mellitus with other specified complication: Secondary | ICD-10-CM

## 2024-09-21 DIAGNOSIS — E669 Obesity, unspecified: Secondary | ICD-10-CM | POA: Diagnosis not present

## 2024-09-21 DIAGNOSIS — Z96651 Presence of right artificial knee joint: Secondary | ICD-10-CM | POA: Diagnosis not present

## 2024-09-21 DIAGNOSIS — E1122 Type 2 diabetes mellitus with diabetic chronic kidney disease: Secondary | ICD-10-CM | POA: Diagnosis not present

## 2024-09-21 DIAGNOSIS — Z23 Encounter for immunization: Secondary | ICD-10-CM | POA: Insufficient documentation

## 2024-09-21 DIAGNOSIS — Z8673 Personal history of transient ischemic attack (TIA), and cerebral infarction without residual deficits: Secondary | ICD-10-CM | POA: Diagnosis not present

## 2024-09-21 DIAGNOSIS — I130 Hypertensive heart and chronic kidney disease with heart failure and stage 1 through stage 4 chronic kidney disease, or unspecified chronic kidney disease: Secondary | ICD-10-CM | POA: Insufficient documentation

## 2024-09-21 DIAGNOSIS — E785 Hyperlipidemia, unspecified: Secondary | ICD-10-CM | POA: Diagnosis not present

## 2024-09-21 DIAGNOSIS — I10 Essential (primary) hypertension: Secondary | ICD-10-CM | POA: Diagnosis not present

## 2024-09-21 DIAGNOSIS — J45909 Unspecified asthma, uncomplicated: Secondary | ICD-10-CM | POA: Insufficient documentation

## 2024-09-21 DIAGNOSIS — N189 Chronic kidney disease, unspecified: Secondary | ICD-10-CM | POA: Insufficient documentation

## 2024-09-21 DIAGNOSIS — Z79899 Other long term (current) drug therapy: Secondary | ICD-10-CM | POA: Insufficient documentation

## 2024-09-21 DIAGNOSIS — R4189 Other symptoms and signs involving cognitive functions and awareness: Secondary | ICD-10-CM | POA: Diagnosis not present

## 2024-09-21 DIAGNOSIS — Z794 Long term (current) use of insulin: Secondary | ICD-10-CM | POA: Diagnosis not present

## 2024-09-21 DIAGNOSIS — J449 Chronic obstructive pulmonary disease, unspecified: Secondary | ICD-10-CM | POA: Diagnosis not present

## 2024-09-21 DIAGNOSIS — G40909 Epilepsy, unspecified, not intractable, without status epilepticus: Secondary | ICD-10-CM | POA: Insufficient documentation

## 2024-09-21 DIAGNOSIS — Z7984 Long term (current) use of oral hypoglycemic drugs: Secondary | ICD-10-CM | POA: Insufficient documentation

## 2024-09-21 DIAGNOSIS — F418 Other specified anxiety disorders: Secondary | ICD-10-CM | POA: Diagnosis not present

## 2024-09-21 DIAGNOSIS — R55 Syncope and collapse: Principal | ICD-10-CM | POA: Insufficient documentation

## 2024-09-21 DIAGNOSIS — E872 Acidosis, unspecified: Secondary | ICD-10-CM | POA: Diagnosis not present

## 2024-09-21 DIAGNOSIS — Z6834 Body mass index (BMI) 34.0-34.9, adult: Secondary | ICD-10-CM | POA: Insufficient documentation

## 2024-09-21 DIAGNOSIS — R404 Transient alteration of awareness: Secondary | ICD-10-CM

## 2024-09-21 DIAGNOSIS — R569 Unspecified convulsions: Principal | ICD-10-CM

## 2024-09-21 DIAGNOSIS — R29818 Other symptoms and signs involving the nervous system: Secondary | ICD-10-CM | POA: Insufficient documentation

## 2024-09-21 DIAGNOSIS — I5032 Chronic diastolic (congestive) heart failure: Secondary | ICD-10-CM | POA: Insufficient documentation

## 2024-09-21 DIAGNOSIS — G4730 Sleep apnea, unspecified: Secondary | ICD-10-CM | POA: Diagnosis not present

## 2024-09-21 DIAGNOSIS — E66811 Obesity, class 1: Secondary | ICD-10-CM | POA: Diagnosis not present

## 2024-09-21 DIAGNOSIS — F909 Attention-deficit hyperactivity disorder, unspecified type: Secondary | ICD-10-CM | POA: Diagnosis not present

## 2024-09-21 DIAGNOSIS — Z7985 Long-term (current) use of injectable non-insulin antidiabetic drugs: Secondary | ICD-10-CM | POA: Insufficient documentation

## 2024-09-21 DIAGNOSIS — R7989 Other specified abnormal findings of blood chemistry: Secondary | ICD-10-CM | POA: Diagnosis present

## 2024-09-21 DIAGNOSIS — E119 Type 2 diabetes mellitus without complications: Secondary | ICD-10-CM

## 2024-09-21 DIAGNOSIS — G4733 Obstructive sleep apnea (adult) (pediatric): Secondary | ICD-10-CM | POA: Diagnosis not present

## 2024-09-21 LAB — CBC WITH DIFFERENTIAL/PLATELET
Abs Immature Granulocytes: 0.02 K/uL (ref 0.00–0.07)
Basophils Absolute: 0.1 K/uL (ref 0.0–0.1)
Basophils Relative: 1 %
Eosinophils Absolute: 0.1 K/uL (ref 0.0–0.5)
Eosinophils Relative: 2 %
HCT: 40.1 % (ref 36.0–46.0)
Hemoglobin: 13 g/dL (ref 12.0–15.0)
Immature Granulocytes: 0 %
Lymphocytes Relative: 39 %
Lymphs Abs: 2.1 K/uL (ref 0.7–4.0)
MCH: 27.7 pg (ref 26.0–34.0)
MCHC: 32.4 g/dL (ref 30.0–36.0)
MCV: 85.5 fL (ref 80.0–100.0)
Monocytes Absolute: 0.4 K/uL (ref 0.1–1.0)
Monocytes Relative: 7 %
Neutro Abs: 2.8 K/uL (ref 1.7–7.7)
Neutrophils Relative %: 51 %
Platelets: 318 K/uL (ref 150–400)
RBC: 4.69 MIL/uL (ref 3.87–5.11)
RDW: 13.7 % (ref 11.5–15.5)
WBC: 5.4 K/uL (ref 4.0–10.5)
nRBC: 0 % (ref 0.0–0.2)

## 2024-09-21 LAB — COMPREHENSIVE METABOLIC PANEL WITH GFR
ALT: 30 U/L (ref 0–44)
AST: 23 U/L (ref 15–41)
Albumin: 4.3 g/dL (ref 3.5–5.0)
Alkaline Phosphatase: 118 U/L (ref 38–126)
Anion gap: 14 (ref 5–15)
BUN: 12 mg/dL (ref 6–20)
CO2: 27 mmol/L (ref 22–32)
Calcium: 9.6 mg/dL (ref 8.9–10.3)
Chloride: 96 mmol/L — ABNORMAL LOW (ref 98–111)
Creatinine, Ser: 1.02 mg/dL — ABNORMAL HIGH (ref 0.44–1.00)
GFR, Estimated: >60 mL/min
Glucose, Bld: 316 mg/dL — ABNORMAL HIGH (ref 70–99)
Potassium: 3.8 mmol/L (ref 3.5–5.1)
Sodium: 137 mmol/L (ref 135–145)
Total Bilirubin: 0.2 mg/dL (ref 0.0–1.2)
Total Protein: 7.1 g/dL (ref 6.5–8.1)

## 2024-09-21 LAB — URINALYSIS, ROUTINE W REFLEX MICROSCOPIC
Bilirubin Urine: NEGATIVE
Glucose, UA: >=500 mg/dL — AB
Hgb urine dipstick: NEGATIVE
Ketones, ur: NEGATIVE mg/dL
Leukocytes,Ua: NEGATIVE
Nitrite: NEGATIVE
Protein, ur: NEGATIVE mg/dL
Specific Gravity, Urine: 1.007 (ref 1.005–1.030)
pH: 5 (ref 5.0–8.0)

## 2024-09-21 LAB — URINE DRUG SCREEN
Amphetamines: NEGATIVE
Barbiturates: NEGATIVE
Benzodiazepines: NEGATIVE
Cocaine: NEGATIVE
Fentanyl: NEGATIVE
Methadone Scn, Ur: NEGATIVE
Opiates: NEGATIVE
Tetrahydrocannabinol: NEGATIVE

## 2024-09-21 LAB — LACTIC ACID, PLASMA
Lactic Acid, Venous: 3.1 mmol/L (ref 0.5–1.9)
Lactic Acid, Venous: 3.5 mmol/L (ref 0.5–1.9)
Lactic Acid, Venous: 2.9 mmol/L (ref 0.5–1.9)

## 2024-09-21 LAB — ETHANOL: Alcohol, Ethyl (B): <15 mg/dL

## 2024-09-21 LAB — TROPONIN T, HIGH SENSITIVITY
Troponin T High Sensitivity: 8 ng/L (ref 0–19)
Troponin T High Sensitivity: 10 ng/L (ref 0–19)

## 2024-09-21 LAB — OSMOLALITY: Osmolality: 296 mosm/kg — ABNORMAL HIGH (ref 275–295)

## 2024-09-21 MED ORDER — LEVETIRACETAM (KEPPRA) 500 MG/5 ML ADULT IV PUSH
1000.0000 mg | Freq: Once | INTRAVENOUS | Status: AC
  Administered 2024-09-21: 1000 mg via INTRAVENOUS
  Filled 2024-09-21: qty 10

## 2024-09-21 MED ORDER — ENOXAPARIN SODIUM 60 MG/0.6ML IJ SOSY
50.0000 mg | PREFILLED_SYRINGE | INTRAMUSCULAR | Status: DC
  Administered 2024-09-22 – 2024-09-24 (×3): 50 mg via SUBCUTANEOUS
  Filled 2024-09-21 (×3): qty 0.6

## 2024-09-21 MED ORDER — INSULIN ASPART 100 UNIT/ML IJ SOLN
0.0000 [IU] | Freq: Three times a day (TID) | INTRAMUSCULAR | Status: DC
  Administered 2024-09-22: 2 [IU] via SUBCUTANEOUS
  Administered 2024-09-22 (×2): 3 [IU] via SUBCUTANEOUS
  Administered 2024-09-23: 2 [IU] via SUBCUTANEOUS
  Administered 2024-09-23: 5 [IU] via SUBCUTANEOUS
  Administered 2024-09-23 – 2024-09-24 (×2): 2 [IU] via SUBCUTANEOUS
  Administered 2024-09-24: 15 [IU] via SUBCUTANEOUS
  Filled 2024-09-21: qty 3
  Filled 2024-09-21 (×2): qty 2
  Filled 2024-09-21: qty 3
  Filled 2024-09-21: qty 15
  Filled 2024-09-21 (×2): qty 2
  Filled 2024-09-21: qty 5

## 2024-09-21 MED ORDER — ALBUTEROL SULFATE (2.5 MG/3ML) 0.083% IN NEBU: 2.5000 mg | INHALATION_SOLUTION | RESPIRATORY_TRACT | Status: DC | PRN

## 2024-09-21 MED ORDER — SODIUM CHLORIDE 0.9 % IV BOLUS
1000.0000 mL | Freq: Once | INTRAVENOUS | Status: AC
  Administered 2024-09-21: 1000 mL via INTRAVENOUS

## 2024-09-21 MED ORDER — SODIUM CHLORIDE 0.9 % IV SOLN: INTRAVENOUS | Status: DC

## 2024-09-21 MED ORDER — LORAZEPAM 2 MG/ML IJ SOLN: 2.0000 mg | INTRAMUSCULAR | Status: DC | PRN

## 2024-09-21 MED ORDER — ALBUTEROL SULFATE HFA 108 (90 BASE) MCG/ACT IN AERS: 2.0000 | INHALATION_SPRAY | RESPIRATORY_TRACT | Status: DC | PRN

## 2024-09-21 MED ORDER — HYDRALAZINE HCL 20 MG/ML IJ SOLN: 5.0000 mg | INTRAMUSCULAR | Status: DC | PRN

## 2024-09-21 MED ORDER — ACETAMINOPHEN 325 MG PO TABS: 650.0000 mg | ORAL_TABLET | Freq: Four times a day (QID) | ORAL | Status: DC | PRN

## 2024-09-21 MED ORDER — DM-GUAIFENESIN ER 30-600 MG PO TB12: 1.0000 | ORAL_TABLET | Freq: Two times a day (BID) | ORAL | Status: DC | PRN

## 2024-09-21 MED ORDER — INSULIN ASPART 100 UNIT/ML IJ SOLN: 0.0000 [IU] | Freq: Every day | INTRAMUSCULAR | Status: DC

## 2024-09-21 MED ORDER — TOPIRAMATE 100 MG PO TABS
100.0000 mg | ORAL_TABLET | Freq: Two times a day (BID) | ORAL | Status: DC
  Administered 2024-09-22 – 2024-09-24 (×5): 100 mg via ORAL
  Filled 2024-09-21: qty 1
  Filled 2024-09-21: qty 4
  Filled 2024-09-21 (×3): qty 1
  Filled 2024-09-21: qty 4

## 2024-09-21 MED ORDER — ONDANSETRON HCL 4 MG/2ML IJ SOLN: 4.0000 mg | Freq: Three times a day (TID) | INTRAMUSCULAR | Status: DC | PRN

## 2024-09-21 NOTE — Progress Notes (Signed)
 PHARMACIST - PHYSICIAN COMMUNICATION  CONCERNING:  Enoxaparin  (Lovenox ) for DVT Prophylaxis    RECOMMENDATION: Patient was prescribed enoxaprin 40mg  q24 hours for VTE prophylaxis.   Filed Weights   09/21/24 1701  Weight: 90.7 kg (200 lb)    Body mass index is 34.33 kg/m.  Estimated Creatinine Clearance: 64.8 mL/min (A) (by C-G formula based on SCr of 1.02 mg/dL (H)).   Based on [facility] policy patient is candidate for enoxaparin  0.5mg /kg TBW SQ every 24 hours based on BMI being >30.  DESCRIPTION: Pharmacy has adjusted enoxaparin  dose per [facility] policy.  Patient is now receiving enoxaparin  0.5 mg/kg every 24 hours   Rankin CANDIE Dills, PharmD, [facility] 09/21/2024 11:08 PM

## 2024-09-21 NOTE — H&P (Incomplete)
 " History and Physical    Amanda [name] FMW:[id number] DOB: May 17, 1966 DOA: 09/21/2024  Referring MD/NP/PA:   PCP: Lorel Maxie LABOR, MD   Patient coming from:  The patient is coming from home.     Chief Complaint: Unresponsiveness  HPI: Amanda [name] is a 59 y.o. female with medical history significant of epilepsy on Topamax , HLD, DM, COPD, dCHF, stroke, depression with anxiety, ADHD, chronic fatigue, hepatic steatosis, hemorrhoid, chronic pain/fibromyalgia, GI bleeding, GERD, who presents with unresponsiveness.  Per report, pt drove home and was attempting to get out of the car when she suddenly fell out of the car with   and family found her lying on the ground and called 911. They are uncertain how long she was down for. Family did not report seizure activity. Patient was drowsy but alert when EMS arrived. She denies any complaints at this time. She was given 2 mg of Narcan by EMS for pinpoint pupils with no change.    and tripped and fell out of the car. Family found her laying in the ground and called 911. Unsure about how long she was down.Hx of seizures. Patient alert but lethargic at this time.         Drove home CBG: 213.   Patietn just spoke to RN and stated I don't drink   2mg  of narcan given for pinpoint pupils. RR elevated 33-34. CO2 elevated 40s-50s.             Data reviewed independently and ED Course: pt was found to have     ***       EKG: I have personally reviewed.  Not done in ED, will get one.   ***   Review of Systems:   General: no fevers, chills, no body weight gain, has poor appetite, has fatigue HEENT: no blurry vision, hearing changes or sore throat Respiratory: no dyspnea, coughing, wheezing CV: no chest pain, no palpitations GI: no nausea, vomiting, abdominal pain, diarrhea, constipation GU: no dysuria, burning on urination, increased urinary frequency, hematuria  Ext: no leg edema Neuro: no unilateral weakness, numbness, or tingling,  no vision change or hearing loss Skin: no rash, no skin tear. MSK: No muscle spasm, no deformity, no limitation of range of movement in spin Heme: No easy bruising.  Travel history: No recent long distant travel.   Allergy: Allergies[1]  Past Medical History:  Diagnosis Date   ADD (attention deficit disorder)    Anxiety    Arthritis    knees, shoulder, upper back   Asthma    CFS (chronic fatigue syndrome)    Chewing difficulty    CHF (congestive heart failure) (HCC)    Chronic kidney disease    Constipation    Depression    Depression    Diabetes mellitus without complication (HCC)    Dyspnea    Environmental allergies    Fatty liver    Fibromyalgia    GERD (gastroesophageal reflux disease)    GI bleed 10/02/2018   Headache    migraines - 5x/mo   Hypertension    Hypokalemia 10/14/2017   Hypothyroidism    IBS (irritable bowel syndrome)    IDA (iron  deficiency anemia) 02/23/2021   Joint pain    Lower extremity edema    Major depressive disorder, single episode    Migraine without aura and without status migrainosus, not intractable    Migraines    Motion sickness    ships   Osteoarthritis    Other specified disorders of  thyroid     Sleep apnea    Stroke (HCC)    no residual deficits   Swallowing difficulty    Thyroid  nodule    bilateral and goiter   Vitamin D  deficiency    Wears contact lenses    Wears dentures    partial upper    Past Surgical History:  Procedure Laterality Date   ABDOMINAL HYSTERECTOMY  2000's   ABDOMINAL HYSTERECTOMY     ABDOMINAL SURGERY     laparoscopy x4 with lysis of adhesions   APPENDECTOMY  1986   CESAREAN SECTION  1992   CHOLECYSTECTOMY N/A 04/03/2017   Procedure: LAPAROSCOPIC CHOLECYSTECTOMY;  Surgeon: Desiderio Schanz, MD;  Location: [facility] ORS;  Service: General;  Laterality: N/A;   COLONOSCOPY N/A 10/06/2018   Procedure: COLONOSCOPY;  Surgeon: Unk Corinn Skiff, MD;  Location: [facility]  ENDOSCOPY;  Service: Gastroenterology;  Laterality: N/A;   COLONOSCOPY WITH PROPOFOL  N/A 03/10/2017   Procedure: COLONOSCOPY WITH PROPOFOL ;  Surgeon: Viktoria Lamar DASEN, MD;  Location: [facility] ENDOSCOPY;  Service: Endoscopy;  Laterality: N/A;   ECTOPIC PREGNANCY SURGERY  2000's   ESOPHAGOGASTRODUODENOSCOPY (EGD) WITH PROPOFOL  N/A 03/10/2017   Procedure: ESOPHAGOGASTRODUODENOSCOPY (EGD) WITH PROPOFOL ;  Surgeon: Viktoria Lamar DASEN, MD;  Location: [facility] ENDOSCOPY;  Service: Endoscopy;  Laterality: N/A;   HERNIA REPAIR     JOINT REPLACEMENT Right 12/16/12   knee- medial - makoplasty   KNEE ARTHROSCOPY Right 2006   partial medial and lateral meniscectomies   KNEE ARTHROSCOPY Right 10/06/2015   Procedure: RIGHT KNEE ARTHROSCOPY WITH DEBRIDEMENT;  Surgeon: Helayne Glenn, MD;  Location: [facility];  Service: Orthopedics;  Laterality: Right;  Diabetic - insulin  and oral meds   ROTATOR CUFF REPAIR Left 2006   TUBAL LIGATION  2000's    Social History:  reports that she has never smoked. She has never used smokeless tobacco. She reports that she does not currently use alcohol. She reports that she does not use drugs.  Family History:  Family History  Problem Relation Age of Onset   Hypertension Mother    Diabetes Mother    Heart disease Mother    Depression Mother    Obesity Mother    Hypertension Father    Diabetes Father    Allergies Father    Kidney disease Father    Cancer Father    Breast cancer Neg Hx      Prior to Admission medications  Medication Sig Start Date End Date Taking? Authorizing Provider  albuterol  (VENTOLIN  HFA) 108 (90 Base) MCG/ACT inhaler Inhale 2 puffs into the lungs every 6 (six) hours as needed for wheezing or shortness of breath. 02/20/21   Kasa, Kurian, MD  amphetamine -dextroamphetamine  (ADDERALL  XR) 20 MG 24 hr capsule Take 20 mg by mouth every morning. 07/14/24   [provider]  buprenorphine (BUTRANS) 10 MCG/HR PTWK 1 patch once a  week. 03/29/21   [provider]  desvenlafaxine (PRISTIQ) 50 MG 24 hr tablet Take 50 mg by mouth daily. 01/28/21   [provider]  estradiol (ESTRACE) 0.1 MG/GM vaginal cream  10/15/19   [provider]  Eszopiclone 3 MG TABS Take 3 mg by mouth at bedtime. 08/25/24   [provider]  flunisolide (NASALIDE) 25 MCG/ACT (0.025%) SOLN Place into the nose.    [provider]  furosemide  (LASIX ) 40 MG tablet Take 1 tablet (40 mg total) by mouth 2 (two) times daily. 01/18/22   Donette Ellouise LABOR, FNP  hydrocortisone  (ANUSOL -HC) 25 MG suppository Place 1 suppository (25  mg total) rectally 2 (two) times daily. 09/03/24   Josette Ade, MD  hydrocortisone  2.5 % ointment Apply topically 2 (two) times daily. 01/23/21   [provider]  hydrOXYzine  (ATARAX /VISTARIL ) 25 MG tablet Take 25 mg by mouth daily. 04/09/21   [provider]  hydrOXYzine  (VISTARIL ) 100 MG capsule Take 100 mg by mouth at bedtime. 08/31/24   [provider]  insulin  glargine (LANTUS ) 100 UNIT/ML Solostar Pen Inject 15 Units into the skin daily. 09/03/24   Josette Ade, MD  insulin  lispro (HUMALOG ) 100 UNIT/ML KwikPen Inject 3 Units into the skin 3 (three) times daily with meals. If eating and Blood Glucose (BG) 80 or higher inject 3 units for meal coverage and add correction dose per scale. If not eating, correction dose only. BG <150= 0 unit; BG 150-200= 1 unit; BG 201-250= 2 unit; BG 251-300= 3 unit; BG 301-350= 4 unit; BG 351-400= 5 unit; BG >400= 6 unit and Call Primary Care. 09/03/24   Josette Ade, MD  Insulin  Pen Needle 32G X 4 MM MISC Use 3 (three) times daily. 09/03/24   Josette Ade, MD  JARDIANCE 25 MG TABS tablet Take 25 mg by mouth daily.    [provider]  LINZESS  290 MCG CAPS capsule Take 290 mcg by mouth daily. 04/12/20   [provider]  LORazepam  (ATIVAN ) 1 MG tablet Take 1 mg by mouth 2 (two) times daily as needed for anxiety. 09/20/20    [provider]  meloxicam  (MOBIC ) 15 MG tablet Take 15 mg by mouth daily. 09/01/24   [provider]  mirtazapine  (REMERON  SOL-TAB) 30 MG disintegrating tablet Take 30 mg by mouth at bedtime.    [provider]  mirtazapine  (REMERON ) 30 MG tablet Take 30 mg by mouth at bedtime. 09/01/24   [provider]  montelukast  (SINGULAIR ) 10 MG tablet Take 10 mg by mouth at bedtime. 04/12/20   [provider]  omeprazole  (PRILOSEC) 40 MG capsule Take 40 mg by mouth daily. 04/12/20   [provider]  ondansetron  (ZOFRAN -ODT) 4 MG disintegrating tablet Take 1 tablet (4 mg total) by mouth every 6 (six) hours as needed for nausea or vomiting. 10/06/23   Ward, Josette SAILOR, DO  orlistat  (XENICAL ) 120 MG capsule Take 1 capsule by mouth 3 (three) times daily. 01/10/22   [provider]  polyethylene glycol powder (GLYCOLAX /MIRALAX ) 17 GM/SCOOP powder Take 17 g by mouth daily as needed for mild constipation. Dissolve 1 capful (17g) in 4-8 ounces of liquid and take by mouth daily. 09/03/24   Josette Ade, MD  potassium chloride  SA (KLOR-CON ) 20 MEQ tablet Take 1 tablet (20 mEq total) by mouth daily. 05/25/21   Donette Ellouise LABOR, FNP  pravastatin  (PRAVACHOL ) 40 MG tablet Take 40 mg by mouth daily. 04/12/20   [provider]  pregabalin  (LYRICA ) 150 MG capsule Take 150 mg by mouth 3 (three) times daily. 04/12/20   [provider]  propranolol  ER (INDERAL  LA) 80 MG 24 hr capsule Take 1 capsule (80 mg total) by mouth daily. 05/25/21   Donette Ellouise LABOR, FNP  Semaglutide, 1 MG/DOSE, (OZEMPIC, 1 MG/DOSE,) 2 MG/1.5ML SOPN Inject 2 mg into the skin once a week. On Fridays    [provider]  SUMAtriptan  (IMITREX ) 50 MG tablet Take 1 tablet (50 mg total) by mouth every 2 (two) hours as needed for migraine. May repeat in 2 hours if headache persists or recurs.  Not to exceed 4 pills in a 24-hour period.  If you find you need more medication and you have  taken 4 pills, please go directly to the emergency room or call 911. 11/13/20   Gwenn Kent, MD  tiotropium (SPIRIVA  HANDIHALER) 18 MCG inhalation capsule Place 1 capsule (18 mcg total) into inhaler and inhale daily. 02/20/21   Kasa, Kurian, MD  tiZANidine  (ZANAFLEX ) 4 MG tablet Take 4 mg by mouth every 8 (eight) hours.    [provider]  topiramate  (TOPAMAX ) 100 MG tablet Take 100 mg by mouth 2 (two) times daily. 04/12/20   [provider]  VITAMIN D , CHOLECALCIFEROL , PO Take 2,000 Units by mouth daily.    [provider]  VRAYLAR  4.5 MG CAPS Take 1 capsule by mouth daily. 08/31/24   [provider]    Physical Exam: Vitals:   09/21/24 1710 09/21/24 1900 09/21/24 2000 09/21/24 2100  BP: 122/82 116/72 123/69 125/76  Pulse: 69 60 62 67  Resp: 17 20 20 18   Temp: 97.8 F (36.6 C)   97.9 F (36.6 C)  TempSrc:    Oral  SpO2: 100% 100% 100% 99%  Weight:      Height:       General: Not in acute distress HEENT:       Eyes: PERRL, EOMI, no jaundice       ENT: No discharge from the ears and nose, no pharynx injection, no tonsillar enlargement.        Neck: No JVD, no bruit, no mass felt. Heme: No neck lymph node enlargement. Cardiac: S1/S2, RRR, No murmurs, No gallops or rubs. Respiratory: No rales, wheezing, rhonchi or rubs. GI: Soft, nondistended, nontender, no rebound pain, no organomegaly, BS present. GU: No hematuria Ext: No pitting leg edema bilaterally. 1+DP/PT pulse bilaterally. Musculoskeletal: No joint deformities, No joint redness or warmth, no limitation of ROM in spin. Skin: No rashes.  Neuro: Alert, oriented X3, cranial nerves II-XII grossly intact, moves all extremities normally. Muscle strength 5/5 in all extremities, sensation to light touch intact. Brachial reflex 2+ bilaterally. Knee reflex 1+ bilaterally. Negative Babinski's sign. Normal finger to nose test. Psych: Patient is not psychotic, no suicidal or hemocidal ideation.  Labs  on Admission: I have personally reviewed following labs and imaging studies  CBC: Recent Labs  Lab 09/21/24 1714  WBC 5.4  NEUTROABS 2.8  HGB 13.0  HCT 40.1  MCV 85.5  PLT 318   Basic Metabolic Panel: Recent Labs  Lab 09/21/24 1714  NA 137  K 3.8  CL 96*  CO2 27  GLUCOSE 316*  BUN 12  CREATININE 1.02*  CALCIUM  9.6   GFR: Estimated Creatinine Clearance: 64.8 mL/min (A) (by C-G formula based on SCr of 1.02 mg/dL (H)). Liver Function Tests: Recent Labs  Lab 09/21/24 1714  AST 23  ALT 30  ALKPHOS 118  BILITOT 0.2  PROT 7.1  ALBUMIN 4.3   No results for input(s): LIPASE, AMYLASE in the last 168 hours. No results for input(s): AMMONIA in the last 168 hours. Coagulation Profile: No results for input(s): INR, PROTIME in the last 168 hours. Cardiac Enzymes: No results for input(s): CKTOTAL, CKMB, CKMBINDEX, TROPONINI in the last 168 hours. BNP (last 3 results) No results for input(s): PROBNP in the last 8760 hours. HbA1C: No results for input(s): HGBA1C in the last 72 hours. CBG: No results for input(s): GLUCAP in the last 168 hours. Lipid Profile: No results for input(s): CHOL, HDL, LDLCALC, TRIG, CHOLHDL, LDLDIRECT in the last 72 hours. Thyroid  Function Tests: No results for  input(s): TSH, T4TOTAL, FREET4, T3FREE, THYROIDAB in the last 72 hours. Anemia Panel: No results for input(s): VITAMINB12, FOLATE, FERRITIN, TIBC, IRON , RETICCTPCT in the last 72 hours. Urine analysis:    Component Value Date/Time   COLORURINE YELLOW (A) 09/21/2024 1850   APPEARANCEUR CLEAR (A) 09/21/2024 1850   APPEARANCEUR Cloudy 11/12/2011 1721   LABSPEC 1.007 09/21/2024 1850   LABSPEC 1.010 11/12/2011 1721   PHURINE 5.0 09/21/2024 1850   GLUCOSEU >=500 (A) 09/21/2024 1850   GLUCOSEU Negative 11/12/2011 1721   HGBUR NEGATIVE 09/21/2024 1850   BILIRUBINUR NEGATIVE 09/21/2024 1850   BILIRUBINUR Negative 11/12/2011 1721    KETONESUR NEGATIVE 09/21/2024 1850   PROTEINUR NEGATIVE 09/21/2024 1850   NITRITE NEGATIVE 09/21/2024 1850   LEUKOCYTESUR NEGATIVE 09/21/2024 1850   LEUKOCYTESUR 1+ 11/12/2011 1721   Sepsis Labs: @LABRCNTIP (procalcitonin:4,lacticidven:4) )No results found for this or any previous visit (from the past 240 hours).   Radiological Exams on Admission:   Assessment/Plan Active Problems:   * No active hospital problems. *   Assessment and Plan: No notes have been filed under this hospital service. Service: Hospitalist      Active Problems:   * No active hospital problems. *    DVT ppx: SQ Heparin          SQ Lovenox   Code Status: Full code   ***  Family Communication:     not done, no family member is at bed side.              Yes, patient's    at bed side.       by phone   ***  Disposition Plan:  Anticipate discharge back to previous environment  Consults called:    Admission status and Level of care: :    for obs as inpt        Dispo: The patient is from: {From:23814}              Anticipated d/c is to: {To:23815}              Anticipated d/c date is: {Days:23816}              Patient currently {Medically stable:23817}    Severity of Illness:  {Observation/Inpatient:21159}       Date of Service 09/21/2024    [facility]   If 7PM-7AM, please contact night-coverage [email or web address] 09/21/2024, 10:49 PM       [1] Allergies Allergen Reactions   Penicillins Anaphylaxis    Has patient had a PCN reaction causing immediate rash, facial/tongue/throat swelling, SOB or lightheadedness with hypotension: Yes Has patient had a PCN reaction causing severe rash involving mucus membranes or skin necrosis: No Has patient had a PCN reaction that required hospitalization No Has patient had a PCN reaction occurring within the last 10 years: No If all of the above answers are NO, then may proceed with Cephalosporin use.  Has patient had a PCN  reaction causing immediate rash, facial/tongue/throat swelling, SOB or lightheadedness with hypotension: Yes Has patient had a PCN reaction causing severe rash involving mucus membranes or skin necrosis: No Has patient had a PCN reaction that required hospitalization No Has patient had a PCN reaction occurring within the last 10 years: No If all of the above answers are NO, then may proceed with Cephalosporin use. Other reaction(s): Other (See Comments) Severe/almost died Other reaction(s): ANAPHYLAXIS Has patient had a PCN reaction causing immediate rash, facial/tongue/throat swelling, SOB or lightheadedness with hypotension: Yes Has patient had a  PCN reaction causing severe rash involving mucus membranes or skin necrosis: No Has patient had a PCN reaction that required hospitalization No Has patient had a PCN reaction occurring within the last 10 years: No If all of the above answers are NO, then may proceed with Cephalosporin use. Other reaction(s): Other (See Comments) Severe/almost died Other reaction(s): ANAPHYLAXIS Has patient had a PCN reaction causing immediate rash, facial/tongue/throat swelling, SOB or lightheadedness with hypotension: Yes Has patient had a PCN reaction causing severe rash involving mucus membranes or skin necrosis: No Has patient had a PCN reaction that required hospitalization No Has patient had a PCN reaction occurring within the last 10 years: No If all of the above answers are NO, then may proceed with Cephalospo... (TRUNCATED)   Penicillins Anaphylaxis   Ace Inhibitors Swelling   Ace Inhibitors Swelling  "

## 2024-09-21 NOTE — ED Provider Notes (Signed)
 "  [facility] Provider Note    Event Date/Time   First MD Initiated Contact with Patient 09/21/24 1658     (approximate)   History   unresponsive   HPI  Amanda [name] is a 59 y.o. female with a past medical history of hypertension, CHF, syncope, epilepsy, presenting to the emergency department after an episode of unresponsiveness.  Family reported that the patient drove home and was attempting to get out of the car when she suddenly fell out of the car and family found her lying on the ground and called 911.  They are uncertain how long she was down for.  Family did not report seizure activity.  Patient was drowsy but alert when EMS arrived.  She denies any complaints at this time.  She was given 2 mg of Narcan by EMS for pinpoint pupils with no change.     Physical Exam   Triage Vital Signs: ED Triage Vitals  Encounter Vitals Group     BP 09/21/24 1710 122/82     Girls Systolic BP Percentile --      Girls Diastolic BP Percentile --      Boys Systolic BP Percentile --      Boys Diastolic BP Percentile --      Pulse Rate 09/21/24 1710 69     Resp 09/21/24 1710 17     Temp 09/21/24 1710 97.8 F (36.6 C)     Temp src --      SpO2 09/21/24 1710 100 %     Weight 09/21/24 1701 200 lb (90.7 kg)     Height 09/21/24 1701 5' 4 (1.626 m)     Head Circumference --      Peak Flow --      Pain Score 09/21/24 1700 0     Pain Loc --      Pain Education --      Exclude from Growth Chart --     Most recent vital signs: Vitals:   09/21/24 1710  BP: 122/82  Pulse: 69  Resp: 17  Temp: 97.8 F (36.6 C)  SpO2: 100%     General: Awake, no distress.  CV:  Good peripheral perfusion.  Resp:  Normal effort.  Abd:  No distention.  Other:     ED Results / Procedures / Treatments   Labs (all labs ordered are listed, but only abnormal results are displayed) Labs Reviewed  CULTURE, BLOOD (ROUTINE X 2)  CULTURE, BLOOD (ROUTINE X 2)  COMPREHENSIVE METABOLIC  PANEL WITH GFR  ETHANOL  LACTIC ACID, PLASMA  LACTIC ACID, PLASMA  CBC WITH DIFFERENTIAL/PLATELET  URINALYSIS, ROUTINE W REFLEX MICROSCOPIC  URINE DRUG SCREEN  TROPONIN T, HIGH SENSITIVITY     EKG  ED ECG REPORT I, Reche CHRISTELLA Leventhal, the attending physician, personally viewed and interpreted this ECG.  Date: 09/21/2024 Rate: 67 bpm Rhythm: normal sinus rhythm QRS Axis: normal Intervals: normal ST/T Wave abnormalities: normal Narrative Interpretation: no evidence of acute ischemia    RADIOLOGY I, Reche CHRISTELLA Leventhal, personally viewed and evaluated these images (plain radiographs) as part of my medical decision making, as well as reviewing the written report by the radiologist.  No acute pathology on chest x-ray.     PROCEDURES:  Critical Care performed: No  Procedures   MEDICATIONS ORDERED IN ED: Medications - No data to display   IMPRESSION / MDM / ASSESSMENT AND PLAN / ED COURSE  I reviewed the triage vital signs and the nursing notes.  Differential diagnosis includes, but is not limited to, alcohol, illicit or prescription medications, or other toxic ingestion; intracranial pathology such as stroke or intracerebral hemorrhage; fever or infectious causes including sepsis; hypoxemia and/or hypercarbia; uremia; trauma; endocrine related disorders such as diabetes, hypoglycemia, and thyroid -related diseases; hypertensive encephalopathy; etc.   Patient's presentation is most consistent with acute presentation with potential threat to life or bodily function.  Patient is a 59 year old female with a past medical history of hypertension, CHF, syncope, epilepsy, presenting to the emergency department after an episode of unresponsiveness.  The patient has been seen here twice in the recent past with similar symptoms.  It appears previously she was admitted for HHS.  Patient's workup is overall unremarkable aside from an elevated lactic  acid.  Initially 3.1 which increased to 3.5.  After fluids this only decreased to 2.9.  Discussed case with the patient's family who reports that the patient has no memory of how she got to the daughter's house today.  Patient reports that she felt like she does before she has a seizure.  Given concern for possible seizure activity she will be given a loading dose of Keppra .  Given the patient's continued elevated lactic acid I did discuss the patient's case with the hospitalist who is in agreement with plan for admission at this time.      FINAL CLINICAL IMPRESSION(S) / ED DIAGNOSES   Final diagnoses:  Seizure-like activity (HCC)  Episode of unresponsiveness  Lactic acidosis     Rx / DC Orders   ED Discharge Orders     None        Note:  This document was prepared using Dragon voice recognition software and may include unintentional dictation errors.   [place] Reche HERO, MD 09/21/24 2308  "

## 2024-09-21 NOTE — H&P (Incomplete)
 " History and Physical    Amanda [name] FMW:[id number] DOB: 04-01-1966 DOA: 09/21/2024  Referring MD/NP/PA:   PCP: Lorel Maxie LABOR, MD   Patient coming from:  The patient is coming from home.     Chief Complaint: Unresponsiveness  HPI: Amanda [name] is a 59 y.o. female with medical history significant of epilepsy on Topamax , HLD, DM, COPD, dCHF, stroke, depression with anxiety, ADHD, chronic fatigue, hepatic steatosis, hemorrhoid, chronic pain/fibromyalgia, GI bleeding, GERD, who presents with unresponsiveness.  Per report, pt drove home and was attempting to get out of the car when she suddenly fell out of the car with unresponsiveness. Family found her lying on the ground and called 911. Patient was drowsy but alert when EMS arrived. She was given 2 mg of Narcan by EMS for pinpoint pupils with no change of mental status. When I saw pt in ED, she is alert oriented x 3.  She answered all questions appropriately.  She states that she did not take her seizure medication Topamax  yesterday.  She thought she could have had seizure since she had shaking.  Currently patient does not have unilateral numbness or tingling in extremities.  No facial droop or slurred speech.  Patient does not have chest pain, cough, SOB.  She states she had nausea and vomited few times, which has resolved.  No diarrhea or abdominal pain.  No symptoms UTI.  No fever or chills.  Data reviewed independently and ED Course: pt was found to have WBC 5.4, GFR> 60, troponin 8 --> 10, lactic acid 3.1 --> 3.5 --> 2.9, UA negative except for rare bacteria, negative UDS, alcohol level less than 15.  Temperature normal, blood pressure 125/76, heart rate 69, RR 18, oxygen sat 99% on room air currently.  Chest x-ray negative.  CT of head negative for acute intracranial abnormalities patient is placed in PCU for observation.   EKG: I have personally reviewed.  Sinus rhythm, QTc 454, nonspecific T wave change.   Review of Systems:   General:  no fevers, chills, no body weight gain, has fatigue HEENT: no blurry vision, hearing changes or sore throat Respiratory: no dyspnea, coughing, wheezing CV: no chest pain, no palpitations GI: has nausea, vomiting, no abdominal pain, diarrhea, constipation GU: no dysuria, burning on urination, increased urinary frequency, hematuria  Ext: no leg edema Neuro: no unilateral weakness, numbness, or tingling, no vision change or hearing loss. Had fall, shaking and unresponsiveness Skin: no rash, no skin tear. MSK: No muscle spasm, no deformity, no limitation of range of movement in spin Heme: No easy bruising.  Travel history: No recent long distant travel.   Allergy: Allergies[1]  Past Medical History:  Diagnosis Date   ADD (attention deficit disorder)    Anxiety    Arthritis    knees, shoulder, upper back   Asthma    CFS (chronic fatigue syndrome)    Chewing difficulty    CHF (congestive heart failure) (HCC)    Chronic kidney disease    Constipation    Depression    Depression    Diabetes mellitus without complication (HCC)    Dyspnea    Environmental allergies    Fatty liver    Fibromyalgia    GERD (gastroesophageal reflux disease)    GI bleed 10/02/2018   Headache    migraines - 5x/mo   Hypertension    Hypokalemia 10/14/2017   Hypothyroidism    IBS (irritable bowel syndrome)    IDA (iron  deficiency anemia) 02/23/2021   Joint pain  Lower extremity edema    Major depressive disorder, single episode    Migraine without aura and without status migrainosus, not intractable    Migraines    Motion sickness    ships   Osteoarthritis    Other specified disorders of thyroid     Sleep apnea    Stroke (HCC)    no residual deficits   Swallowing difficulty    Thyroid  nodule    bilateral and goiter   Vitamin D  deficiency    Wears contact lenses    Wears dentures    partial upper    Past Surgical History:  Procedure Laterality Date   ABDOMINAL HYSTERECTOMY  2000's    ABDOMINAL HYSTERECTOMY     ABDOMINAL SURGERY     laparoscopy x4 with lysis of adhesions   APPENDECTOMY  1986   CESAREAN SECTION  1992   CHOLECYSTECTOMY N/A 04/03/2017   Procedure: LAPAROSCOPIC CHOLECYSTECTOMY;  Surgeon: Desiderio Schanz, MD;  Location: [facility] ORS;  Service: General;  Laterality: N/A;   COLONOSCOPY N/A 10/06/2018   Procedure: COLONOSCOPY;  Surgeon: Unk Corinn Skiff, MD;  Location: [facility] ENDOSCOPY;  Service: Gastroenterology;  Laterality: N/A;   COLONOSCOPY WITH PROPOFOL  N/A 03/10/2017   Procedure: COLONOSCOPY WITH PROPOFOL ;  Surgeon: Viktoria Lamar DASEN, MD;  Location: [facility] ENDOSCOPY;  Service: Endoscopy;  Laterality: N/A;   ECTOPIC PREGNANCY SURGERY  2000's   ESOPHAGOGASTRODUODENOSCOPY (EGD) WITH PROPOFOL  N/A 03/10/2017   Procedure: ESOPHAGOGASTRODUODENOSCOPY (EGD) WITH PROPOFOL ;  Surgeon: Viktoria Lamar DASEN, MD;  Location: [facility] ENDOSCOPY;  Service: Endoscopy;  Laterality: N/A;   HERNIA REPAIR     JOINT REPLACEMENT Right 12/16/12   knee- medial - makoplasty   KNEE ARTHROSCOPY Right 2006   partial medial and lateral meniscectomies   KNEE ARTHROSCOPY Right 10/06/2015   Procedure: RIGHT KNEE ARTHROSCOPY WITH DEBRIDEMENT;  Surgeon: Helayne Glenn, MD;  Location: [facility];  Service: Orthopedics;  Laterality: Right;  Diabetic - insulin  and oral meds   ROTATOR CUFF REPAIR Left 2006   TUBAL LIGATION  2000's    Social History:  reports that she has never smoked. She has never used smokeless tobacco. She reports that she does not currently use alcohol. She reports that she does not use drugs.  Family History:  Family History  Problem Relation Age of Onset   Hypertension Mother    Diabetes Mother    Heart disease Mother    Depression Mother    Obesity Mother    Hypertension Father    Diabetes Father    Allergies Father    Kidney disease Father    Cancer Father    Breast cancer Neg Hx      Prior to Admission medications  Medication Sig Start Date End Date Taking?  Authorizing Provider  albuterol  (VENTOLIN  HFA) 108 (90 Base) MCG/ACT inhaler Inhale 2 puffs into the lungs every 6 (six) hours as needed for wheezing or shortness of breath. 02/20/21   Kasa, Kurian, MD  amphetamine -dextroamphetamine  (ADDERALL  XR) 20 MG 24 hr capsule Take 20 mg by mouth every morning. 07/14/24   [provider]  buprenorphine (BUTRANS) 10 MCG/HR PTWK 1 patch once a week. 03/29/21   [provider]  desvenlafaxine (PRISTIQ) 50 MG 24 hr tablet Take 50 mg by mouth daily. 01/28/21   [provider]  estradiol (ESTRACE) 0.1 MG/GM vaginal cream  10/15/19   [provider]  Eszopiclone 3 MG TABS Take 3 mg by mouth at bedtime. 08/25/24   [provider]  flunisolide (NASALIDE) 25 MCG/ACT (0.025%)  SOLN Place into the nose.    [provider]  furosemide  (LASIX ) 40 MG tablet Take 1 tablet (40 mg total) by mouth 2 (two) times daily. 01/18/22   Donette Ellouise LABOR, FNP  hydrocortisone  (ANUSOL -HC) 25 MG suppository Place 1 suppository (25 mg total) rectally 2 (two) times daily. 09/03/24   Josette Ade, MD  hydrocortisone  2.5 % ointment Apply topically 2 (two) times daily. 01/23/21   [provider]  hydrOXYzine  (ATARAX /VISTARIL ) 25 MG tablet Take 25 mg by mouth daily. 04/09/21   [provider]  hydrOXYzine  (VISTARIL ) 100 MG capsule Take 100 mg by mouth at bedtime. 08/31/24   [provider]  insulin  glargine (LANTUS ) 100 UNIT/ML Solostar Pen Inject 15 Units into the skin daily. 09/03/24   Josette Ade, MD  insulin  lispro (HUMALOG ) 100 UNIT/ML KwikPen Inject 3 Units into the skin 3 (three) times daily with meals. If eating and Blood Glucose (BG) 80 or higher inject 3 units for meal coverage and add correction dose per scale. If not eating, correction dose only. BG <150= 0 unit; BG 150-200= 1 unit; BG 201-250= 2 unit; BG 251-300= 3 unit; BG 301-350= 4 unit; BG 351-400= 5 unit; BG >400= 6 unit and Call Primary Care. 09/03/24    Josette Ade, MD  Insulin  Pen Needle 32G X 4 MM MISC Use 3 (three) times daily. 09/03/24   Josette Ade, MD  JARDIANCE 25 MG TABS tablet Take 25 mg by mouth daily.    [provider]  LINZESS  290 MCG CAPS capsule Take 290 mcg by mouth daily. 04/12/20   [provider]  LORazepam  (ATIVAN ) 1 MG tablet Take 1 mg by mouth 2 (two) times daily as needed for anxiety. 09/20/20   [provider]  meloxicam  (MOBIC ) 15 MG tablet Take 15 mg by mouth daily. 09/01/24   [provider]  mirtazapine  (REMERON  SOL-TAB) 30 MG disintegrating tablet Take 30 mg by mouth at bedtime.    [provider]  mirtazapine  (REMERON ) 30 MG tablet Take 30 mg by mouth at bedtime. 09/01/24   [provider]  montelukast  (SINGULAIR ) 10 MG tablet Take 10 mg by mouth at bedtime. 04/12/20   [provider]  omeprazole  (PRILOSEC) 40 MG capsule Take 40 mg by mouth daily. 04/12/20   [provider]  ondansetron  (ZOFRAN -ODT) 4 MG disintegrating tablet Take 1 tablet (4 mg total) by mouth every 6 (six) hours as needed for nausea or vomiting. 10/06/23   Ward, Josette SAILOR, DO  orlistat  (XENICAL ) 120 MG capsule Take 1 capsule by mouth 3 (three) times daily. 01/10/22   [provider]  polyethylene glycol powder (GLYCOLAX /MIRALAX ) 17 GM/SCOOP powder Take 17 g by mouth daily as needed for mild constipation. Dissolve 1 capful (17g) in 4-8 ounces of liquid and take by mouth daily. 09/03/24   Josette Ade, MD  potassium chloride  SA (KLOR-CON ) 20 MEQ tablet Take 1 tablet (20 mEq total) by mouth daily. 05/25/21   Donette Ellouise LABOR, FNP  pravastatin  (PRAVACHOL ) 40 MG tablet Take 40 mg by mouth daily. 04/12/20   [provider]  pregabalin  (LYRICA ) 150 MG capsule Take 150 mg by mouth 3 (three) times daily. 04/12/20   [provider]  propranolol  ER (INDERAL  LA) 80 MG 24 hr capsule Take 1 capsule (80 mg total) by mouth daily. 05/25/21   Donette Ellouise LABOR, FNP   Semaglutide, 1 MG/DOSE, (OZEMPIC, 1 MG/DOSE,) 2 MG/1.5ML SOPN Inject 2 mg into the skin once a week. On Fridays  [provider]  SUMAtriptan  (IMITREX ) 50 MG tablet Take 1 tablet (50 mg total) by mouth every 2 (two) hours as needed for migraine. May repeat in 2 hours if headache persists or recurs.  Not to exceed 4 pills in a 24-hour period.  If you find you need more medication and you have taken 4 pills, please go directly to the emergency room or call 911. 11/13/20   Gwenn Kent, MD  tiotropium (SPIRIVA  HANDIHALER) 18 MCG inhalation capsule Place 1 capsule (18 mcg total) into inhaler and inhale daily. 02/20/21   Kasa, Kurian, MD  tiZANidine  (ZANAFLEX ) 4 MG tablet Take 4 mg by mouth every 8 (eight) hours.    [provider]  topiramate  (TOPAMAX ) 100 MG tablet Take 100 mg by mouth 2 (two) times daily. 04/12/20   [provider]  VITAMIN D , CHOLECALCIFEROL , PO Take 2,000 Units by mouth daily.    [provider]  VRAYLAR  4.5 MG CAPS Take 1 capsule by mouth daily. 08/31/24   [provider]    Physical Exam: Vitals:   09/21/24 2000 09/21/24 2100 09/21/24 2200 09/21/24 2300  BP: 123/69 125/76 107/76 126/69  Pulse: 62 67 71 61  Resp: 20 18 16 14   Temp:  97.9 F (36.6 C)    TempSrc:  Oral    SpO2: 100% 99% 100% 100%  Weight:      Height:       General: Not in acute distress.  Dry mucosal membrane. HEENT:       Eyes: PERRL, EOMI, no jaundice       ENT: No discharge from the ears and nose, no pharynx injection, no tonsillar enlargement.        Neck: No JVD, no bruit, no mass felt. Heme: No neck lymph node enlargement. Cardiac: S1/S2, RRR, No murmurs, No gallops or rubs. Respiratory: No rales, wheezing, rhonchi or rubs. GI: Soft, nondistended, nontender, no rebound pain, no organomegaly, BS present. GU: No hematuria Ext: No pitting leg edema bilaterally. 1+DP/PT pulse bilaterally. Musculoskeletal: No joint deformities, No joint redness or  warmth, no limitation of ROM in spin. Skin: No rashes.  Neuro: Alert, oriented X3, cranial nerves II-XII grossly intact, moves all extremities normally. Muscle strength 5/5 in all extremities, sensation to light touch intact.  Psych: Patient is not psychotic, no suicidal or hemocidal ideation.  Labs on Admission: I have personally reviewed following labs and imaging studies  CBC: Recent Labs  Lab 09/21/24 1714  WBC 5.4  NEUTROABS 2.8  HGB 13.0  HCT 40.1  MCV 85.5  PLT 318   Basic Metabolic Panel: Recent Labs  Lab 09/21/24 1714  NA 137  K 3.8  CL 96*  CO2 27  GLUCOSE 316*  BUN 12  CREATININE 1.02*  CALCIUM  9.6   GFR: Estimated Creatinine Clearance: 64.8 mL/min (A) (by C-G formula based on SCr of 1.02 mg/dL (H)). Liver Function Tests: Recent Labs  Lab 09/21/24 1714  AST 23  ALT 30  ALKPHOS 118  BILITOT 0.2  PROT 7.1  ALBUMIN 4.3   No results for input(s): LIPASE, AMYLASE in the last 168 hours. No results for input(s): AMMONIA in the last 168 hours. Coagulation Profile: No results for input(s): INR, PROTIME in the last 168 hours. Cardiac Enzymes: No results for input(s): CKTOTAL, CKMB, CKMBINDEX, TROPONINI in the last 168 hours. BNP (last 3 results) No results for input(s): PROBNP in the last 8760 hours. HbA1C: No results for input(s): HGBA1C in the last 72 hours. CBG: No results  for input(s): GLUCAP in the last 168 hours. Lipid Profile: No results for input(s): CHOL, HDL, LDLCALC, TRIG, CHOLHDL, LDLDIRECT in the last 72 hours. Thyroid  Function Tests: No results for input(s): TSH, T4TOTAL, FREET4, T3FREE, THYROIDAB in the last 72 hours. Anemia Panel: No results for input(s): VITAMINB12, FOLATE, FERRITIN, TIBC, IRON , RETICCTPCT in the last 72 hours. Urine analysis:    Component Value Date/Time   COLORURINE YELLOW (A) 09/21/2024 1850   APPEARANCEUR CLEAR (A) 09/21/2024 1850   APPEARANCEUR Cloudy  11/12/2011 1721   LABSPEC 1.007 09/21/2024 1850   LABSPEC 1.010 11/12/2011 1721   PHURINE 5.0 09/21/2024 1850   GLUCOSEU >=500 (A) 09/21/2024 1850   GLUCOSEU Negative 11/12/2011 1721   HGBUR NEGATIVE 09/21/2024 1850   BILIRUBINUR NEGATIVE 09/21/2024 1850   BILIRUBINUR Negative 11/12/2011 1721   KETONESUR NEGATIVE 09/21/2024 1850   PROTEINUR NEGATIVE 09/21/2024 1850   NITRITE NEGATIVE 09/21/2024 1850   LEUKOCYTESUR NEGATIVE 09/21/2024 1850   LEUKOCYTESUR 1+ 11/12/2011 1721   Sepsis Labs: @LABRCNTIP (procalcitonin:4,lacticidven:4) )No results found for this or any previous visit (from the past 240 hours).   Radiological Exams on Admission:   Assessment/Plan Principal Problem:   Unresponsiveness Active Problems:   Epilepsy (HCC)   Elevated lactic acid level   Hypertension   Hyperlipidemia   Chronic diastolic CHF (congestive heart failure) (HCC)   COPD (chronic obstructive pulmonary disease) (HCC)   Diabetes mellitus without complication (HCC)   Fibromyalgia   Sleep apnea   Depression with anxiety   ADHD   Obesity (BMI 30-39.9)   Assessment and Plan:  Unresponsiveness: Etiology is not clear.  Differential diagnosis include seizure and syncope.  No focal neurodeficit on physical examination, low suspicions for stroke.  CT of head is negative for acute intracranial abnormalities.  Patient reports that she did not take her Topamax  yesterday, indicating possible medication noncompliance for seizure.  Patient is taking multiple sedative medications, therefore polypharmacy is a potential differential diagnosis, however patient is currently alert and oriented x 3.  Does not seem to have polypharmacy- related sedative side effects.  Will not hold her sedative medications.   -Placed in PCU for observation - Restart Topamax  100 mg twice daily - Patient was given 1 g Keppra  IV in the ED. - Seizure precaution - As needed Ativan  for seizure - EEG - Check orthostatic vital signs - IV  fluid: 2 L normal saline, then 75 cc/h - Check CK level - Fall precaution - Frequent neurochecks  History of Epilepsy (HCC) -Resumed Topamax  - Check Topamax  level  Elevated lactic acid level: Likely due to dehydration, and seizure related lactic acidosis is also possible.  No fever or leukocytosis.  No signs of sepsis. - Hold Lasix  - IV fluid as above - Trend lactic acid level  Hypertension -IV hydralazine  as needed - Continue propranolol  - Hold Lasix  as above  Hyperlipidemia -Pravastatin   Chronic diastolic CHF (congestive heart failure) (HCC): 2D echo on 05/09/2020 showed EF of 50-55%.  Patient does not have leg edema, clinically dry. -Hold Lasix  - Check BNP  COPD (chronic obstructive pulmonary disease) (HCC): Stable.  No wheezing. -Bronchodilators and as needed Mucinex   Diabetes mellitus without complication [facility]): Recent A1c 7.2, poorly controlled.  Patient is taking Ozempic, Humalog , Lantus  15 units daily -Lantus  15 units daily - SSI  Fibromyalgia and chronic pain: -Patient is not using buprenorphine patch now - Continue Lyrica , as needed tizanidine  - As needed Tylenol   Sleep apnea -CPAP  Depression with anxiety: - Switch Pristiq to Effexor  in hospital -  Continue other home medications  ADHD -Adderall   Obesity (BMI 30-39.9): Patient has Obesity Class I, with body weight 90/7 Kg and BMI 34.3 kg/m2.  - Encourage losing weight - Exercise and healthy diet      DVT ppx: SQ Lovenox   Code Status: Full code    Family Communication:     not done, no family member is at bed side.     Disposition Plan:  Anticipate discharge back to previous environment  Consults called:  none  Admission status and Level of care: Progressive:    for obs    Dispo: The patient is from: Home              Anticipated d/c is to: Home              Anticipated d/c date is: 1 day              Patient currently is not medically stable to d/c.    Severity of Illness:  The  appropriate patient status for this patient is OBSERVATION. Observation status is judged to be reasonable and necessary in order to provide the required intensity of service to ensure the patient's safety. The patient's presenting symptoms, physical exam findings, and initial radiographic and laboratory data in the context of their medical condition is felt to place them at decreased risk for further clinical deterioration. Furthermore, it is anticipated that the patient will be medically stable for discharge from the hospital within 2 midnights of admission.        Date of Service 09/22/2024    [facility]   If 7PM-7AM, please contact night-coverage [email or web address] 09/22/2024, 12:24 AM     [1]  Allergies Allergen Reactions   Penicillins Anaphylaxis    Has patient had a PCN reaction causing immediate rash, facial/tongue/throat swelling, SOB or lightheadedness with hypotension: Yes Has patient had a PCN reaction causing severe rash involving mucus membranes or skin necrosis: No Has patient had a PCN reaction that required hospitalization No Has patient had a PCN reaction occurring within the last 10 years: No If all of the above answers are NO, then may proceed with Cephalosporin use.  Has patient had a PCN reaction causing immediate rash, facial/tongue/throat swelling, SOB or lightheadedness with hypotension: Yes Has patient had a PCN reaction causing severe rash involving mucus membranes or skin necrosis: No Has patient had a PCN reaction that required hospitalization No Has patient had a PCN reaction occurring within the last 10 years: No If all of the above answers are NO, then may proceed with Cephalosporin use. Other reaction(s): Other (See Comments) Severe/almost died Other reaction(s): ANAPHYLAXIS Has patient had a PCN reaction causing immediate rash, facial/tongue/throat swelling, SOB or lightheadedness with hypotension: Yes Has patient had a PCN  reaction causing severe rash involving mucus membranes or skin necrosis: No Has patient had a PCN reaction that required hospitalization No Has patient had a PCN reaction occurring within the last 10 years: No If all of the above answers are NO, then may proceed with Cephalosporin use. Other reaction(s): Other (See Comments) Severe/almost died Other reaction(s): ANAPHYLAXIS Has patient had a PCN reaction causing immediate rash, facial/tongue/throat swelling, SOB or lightheadedness with hypotension: Yes Has patient had a PCN reaction causing severe rash involving mucus membranes or skin necrosis: No Has patient had a PCN reaction that required hospitalization No Has patient had a PCN reaction occurring within the last 10 years: No If all of the above answers are NO, then  may proceed with Cephalospo... (TRUNCATED)   Penicillins Anaphylaxis   Ace Inhibitors Swelling   Ace Inhibitors Swelling   "

## 2024-09-21 NOTE — ED Triage Notes (Signed)
 Drove home and attempted to get our of the car and tripped and fell out of the car. Family found her laying in the ground and called 911. Unsure about how long she was down.Hx of seizures. Patient alert but lethargic at this time. CBG: 213.  Patietn just spoke to RN and stated I don't drink  2mg  of narcan given for pinpoint pupils. RR elevated 33-34. CO2 elevated 40s-50s.

## 2024-09-22 ENCOUNTER — Observation Stay

## 2024-09-22 DIAGNOSIS — R4189 Other symptoms and signs involving cognitive functions and awareness: Secondary | ICD-10-CM | POA: Diagnosis not present

## 2024-09-22 LAB — CBC
HCT: 38.9 % (ref 36.0–46.0)
Hemoglobin: 12.4 g/dL (ref 12.0–15.0)
MCH: 28.1 pg (ref 26.0–34.0)
MCHC: 31.9 g/dL (ref 30.0–36.0)
MCV: 88 fL (ref 80.0–100.0)
Platelets: 310 K/uL (ref 150–400)
RBC: 4.42 MIL/uL (ref 3.87–5.11)
RDW: 13.7 % (ref 11.5–15.5)
WBC: 5 K/uL (ref 4.0–10.5)
nRBC: 0 % (ref 0.0–0.2)

## 2024-09-22 LAB — CBG MONITORING, ED
Glucose-Capillary: 135 mg/dL — ABNORMAL HIGH (ref 70–99)
Glucose-Capillary: 146 mg/dL — ABNORMAL HIGH (ref 70–99)
Glucose-Capillary: 153 mg/dL — ABNORMAL HIGH (ref 70–99)
Glucose-Capillary: 155 mg/dL — ABNORMAL HIGH (ref 70–99)
Glucose-Capillary: 215 mg/dL — ABNORMAL HIGH (ref 70–99)

## 2024-09-22 LAB — BASIC METABOLIC PANEL WITH GFR
Anion gap: 11 (ref 5–15)
BUN: 9 mg/dL (ref 6–20)
CO2: 27 mmol/L (ref 22–32)
Calcium: 9.5 mg/dL (ref 8.9–10.3)
Chloride: 104 mmol/L (ref 98–111)
Creatinine, Ser: 0.83 mg/dL (ref 0.44–1.00)
GFR, Estimated: >60 mL/min
Glucose, Bld: 145 mg/dL — ABNORMAL HIGH (ref 70–99)
Potassium: 3.5 mmol/L (ref 3.5–5.1)
Sodium: 142 mmol/L (ref 135–145)

## 2024-09-22 LAB — LACTIC ACID, PLASMA
Lactic Acid, Venous: 2.1 mmol/L (ref 0.5–1.9)
Lactic Acid, Venous: 2.1 mmol/L (ref 0.5–1.9)
Lactic Acid, Venous: 2.2 mmol/L (ref 0.5–1.9)

## 2024-09-22 LAB — PRO BRAIN NATRIURETIC PEPTIDE: Pro Brain Natriuretic Peptide: <50 pg/mL

## 2024-09-22 LAB — CK: Total CK: 51 U/L (ref 38–234)

## 2024-09-22 MED ORDER — PROPRANOLOL HCL ER 80 MG PO CP24: 80.0000 mg | ORAL_CAPSULE | Freq: Every day | ORAL | Status: DC

## 2024-09-22 MED ORDER — VENLAFAXINE HCL ER 75 MG PO CP24: 75.0000 mg | ORAL_CAPSULE | Freq: Every day | ORAL | Status: DC

## 2024-09-22 MED ORDER — AMPHETAMINE-DEXTROAMPHET ER 5 MG PO CP24
20.0000 mg | ORAL_CAPSULE | Freq: Every morning | ORAL | Status: DC
  Administered 2024-09-22: 20 mg via ORAL
  Filled 2024-09-22: qty 1
  Filled 2024-09-22: qty 4

## 2024-09-22 MED ORDER — HYDROCORTISONE 2.5 % EX OINT: TOPICAL_OINTMENT | Freq: Two times a day (BID) | CUTANEOUS | Status: DC

## 2024-09-22 MED ORDER — TIOTROPIUM BROMIDE 18 MCG IN CAPS: 18.0000 ug | ORAL_CAPSULE | Freq: Every day | RESPIRATORY_TRACT | Status: DC

## 2024-09-22 MED ORDER — HYDROXYZINE HCL 25 MG PO TABS: 25.0000 mg | ORAL_TABLET | Freq: Every day | ORAL | Status: DC

## 2024-09-22 MED ORDER — PREGABALIN 75 MG PO CAPS
150.0000 mg | ORAL_CAPSULE | Freq: Three times a day (TID) | ORAL | Status: DC
  Administered 2024-09-22 – 2024-09-24 (×7): 150 mg via ORAL
  Filled 2024-09-22 (×4): qty 2
  Filled 2024-09-22: qty 3
  Filled 2024-09-22: qty 2
  Filled 2024-09-22: qty 3

## 2024-09-22 MED ORDER — MIRTAZAPINE 15 MG PO TBDP
30.0000 mg | ORAL_TABLET | Freq: Every day | ORAL | Status: DC
  Administered 2024-09-22 – 2024-09-23 (×2): 30 mg via ORAL
  Filled 2024-09-22 (×2): qty 2

## 2024-09-22 MED ORDER — SUMATRIPTAN SUCCINATE 50 MG PO TABS: 50.0000 mg | ORAL_TABLET | ORAL | Status: DC | PRN

## 2024-09-22 MED ORDER — HYDROCORTISONE (PERIANAL) 2.5 % EX CREA: 1.0000 | TOPICAL_CREAM | Freq: Two times a day (BID) | CUTANEOUS | Status: DC | PRN

## 2024-09-22 MED ORDER — CARIPRAZINE HCL 1.5 MG PO CAPS
4.5000 mg | ORAL_CAPSULE | Freq: Every day | ORAL | Status: DC
  Administered 2024-09-22 – 2024-09-24 (×3): 4.5 mg via ORAL
  Filled 2024-09-22 (×4): qty 3

## 2024-09-22 MED ORDER — TIZANIDINE HCL 4 MG PO TABS
4.0000 mg | ORAL_TABLET | Freq: Three times a day (TID) | ORAL | Status: DC | PRN
  Administered 2024-09-22 – 2024-09-23 (×2): 4 mg via ORAL
  Filled 2024-09-22: qty 1
  Filled 2024-09-22: qty 2
  Filled 2024-09-22: qty 1

## 2024-09-22 MED ORDER — HYDROCORTISONE ACETATE 25 MG RE SUPP: 25.0000 mg | Freq: Two times a day (BID) | RECTAL | Status: DC

## 2024-09-22 MED ORDER — MONTELUKAST SODIUM 10 MG PO TABS
10.0000 mg | ORAL_TABLET | Freq: Every day | ORAL | Status: DC
  Administered 2024-09-22 – 2024-09-23 (×2): 10 mg via ORAL
  Filled 2024-09-22 (×2): qty 1

## 2024-09-22 MED ORDER — PRAVASTATIN SODIUM 20 MG PO TABS: 40.0000 mg | ORAL_TABLET | Freq: Every day | ORAL | Status: DC

## 2024-09-22 MED ORDER — HYDROXYZINE HCL 25 MG PO TABS
75.0000 mg | ORAL_TABLET | Freq: Every day | ORAL | Status: DC
  Administered 2024-09-22 – 2024-09-23 (×2): 75 mg via ORAL
  Filled 2024-09-22 (×2): qty 3

## 2024-09-22 MED ORDER — INSULIN GLARGINE 100 UNIT/ML ~~LOC~~ SOLN
15.0000 [IU] | Freq: Every day | SUBCUTANEOUS | Status: DC
  Administered 2024-09-22: 15 [IU] via SUBCUTANEOUS
  Filled 2024-09-22 (×3): qty 0.15

## 2024-09-22 MED ORDER — ORLISTAT 120 MG PO CAPS: 120.0000 mg | ORAL_CAPSULE | Freq: Three times a day (TID) | ORAL | Status: DC

## 2024-09-22 MED ORDER — HYDROCORTISONE ACETATE 25 MG RE SUPP: 25.0000 mg | Freq: Two times a day (BID) | RECTAL | Status: DC | PRN

## 2024-09-22 MED ORDER — PANTOPRAZOLE SODIUM 40 MG PO TBEC
40.0000 mg | DELAYED_RELEASE_TABLET | Freq: Every day | ORAL | Status: DC
  Administered 2024-09-22 – 2024-09-24 (×3): 40 mg via ORAL
  Filled 2024-09-22 (×3): qty 1

## 2024-09-22 MED ORDER — LINACLOTIDE 145 MCG PO CAPS
290.0000 ug | ORAL_CAPSULE | Freq: Every day | ORAL | Status: DC
  Administered 2024-09-22 – 2024-09-24 (×2): 290 ug via ORAL
  Filled 2024-09-22 (×3): qty 2

## 2024-09-22 NOTE — Procedures (Signed)
 Patient has a large hair mat on top of head head from central vertex to parietal area. She does not want to cut it out. I rec she comb it out with some conditioner. I can try to do eeg tomorrow if mat is gone.

## 2024-09-22 NOTE — ED Notes (Signed)
 Called CCMD and added pt to board

## 2024-09-22 NOTE — ED Notes (Signed)
 Pharmacy messaged about pt overdue medication that need to be verified. They replied with the following Once med rec completed, will adjust times when verifying order.  Med rec tech came by earlier, but pt was still  lethargic and unable to answer any questions clearly.  Tech plans to come back to ED to try again after helping with our next med cartfill batch that starts at 0500.

## 2024-09-22 NOTE — ED Notes (Signed)
 While drawing pt lab work, I assessed her neurological status. When asking orientation questions, the patient repeatedly gave the same answer to each question. Pt following command, can be impulsive(getting out of bed without using call light).  Per pt chart upon arrival pt was A&OX3. Rockie, NP made aware.

## 2024-09-22 NOTE — Progress Notes (Signed)
 " PROGRESS NOTE    Amanda [name]  FMW:[id number] DOB: [birth year] DOA: 09/21/2024 PCP: Lorel Maxie LABOR, MD   Assessment & Plan:   Principal Problem:   Unresponsiveness Active Problems:   Epilepsy (HCC)   Elevated lactic acid level   Hypertension   Hyperlipidemia   Chronic diastolic CHF (congestive heart failure) (HCC)   COPD (chronic obstructive pulmonary disease) (HCC)   Diabetes mellitus without complication (HCC)   Fibromyalgia   Sleep apnea   Depression with anxiety   ADHD   Obesity (BMI 30-39.9)  Assessment and Plan: Unresponsiveness: etiology unclear, ddx  seizure vs syncope vs polypharmacy. No complaint w/ seizure meds but pt is unsure of why.   CT head is negative for acute intracranial abnormalities. EEG ordered. Continue on home dose of topamax . Ativan  prn for seizures.   History of epilepsy: continue on home dose of topamax     Elevated lactic acid level: Likely due to dehydration, and seizure related lactic acidosis is also possible.  No fever or leukocytosis.  No signs of sepsis. Will re-check lactic acid   HTN: BP is currently WNL. Holding lasix    HLD: no longer taking a statin   Chronic diastolic CHF: echo on 05/09/2020 showed EF of 50-55%.  Does not have leg edema, clinically dry. Holding lasix . Monitor I/Os   COPD: w/o exacerbation. Bronchodilators prn    DM2: Recent A1c 7.2, poorly controlled. Continue on glargine, SSI w/ accuchecks    Fibromyalgia and chronic pain: continue on home dose of pregabalin     OSA: CPAP qhs   Depression: severity unknown. Continue on home dose of cariprazine    ADHD: continue on home dose of adderall   -Adderall    Obesity: BMI 34.3. Would benefit from weight loss       DVT prophylaxis: lovenox   Code Status: full  Family Communication: called pt's son, [name], no answer so left a voicemail  Disposition Plan:  likely d/c back home   Level of care: Progressive Consultants:    Procedures:   Antimicrobials:     Subjective: Pt c/o fatigue  Objective: Vitals:   09/22/24 0530 09/22/24 0700 09/22/24 0724 09/22/24 0834  BP: 115/79 (!) 144/77  129/79  Pulse: 87   89  Resp: 18 16  17   Temp:   98.4 F (36.9 C)   TempSrc:   Oral   SpO2: 100%   99%  Weight:      Height:        Intake/Output Summary (Last 24 hours) at 09/22/2024 1029 Last data filed at 09/22/2024 0246 Gross per 24 hour  Intake --  Output 350 ml  Net -350 ml   Filed Weights   09/21/24 1701  Weight: 90.7 kg    Examination:  General exam: Appears calm and comfortable  Respiratory system: Clear to auscultation. Respiratory effort normal. Cardiovascular system: S1 & S2+. No rubs, gallops or clicks.  Gastrointestinal system: Abdomen is obese, soft and nontender. Normal bowel sounds heard. Central nervous system: Alert and oriented x3. Moves all extremities  Psychiatry: Judgement and insight appear normal. Flat mood and affect    Data Reviewed: I have personally reviewed following labs and imaging studies  CBC: Recent Labs  Lab 09/21/24 1714 09/22/24 0438  WBC 5.4 5.0  NEUTROABS 2.8  --   HGB 13.0 12.4  HCT 40.1 38.9  MCV 85.5 88.0  PLT 318 310   Basic Metabolic Panel: Recent Labs  Lab 09/21/24 1714 09/22/24 0438  NA 137 142  K 3.8 3.5  CL  96* 104  CO2 27 27  GLUCOSE 316* 145*  BUN 12 9  CREATININE 1.02* 0.83  CALCIUM  9.6 9.5   GFR: Estimated Creatinine Clearance: 79.6 mL/min (by C-G formula based on SCr of 0.83 mg/dL). Liver Function Tests: Recent Labs  Lab 09/21/24 1714  AST 23  ALT 30  ALKPHOS 118  BILITOT 0.2  PROT 7.1  ALBUMIN 4.3   No results for input(s): LIPASE, AMYLASE in the last 168 hours. No results for input(s): AMMONIA in the last 168 hours. Coagulation Profile: No results for input(s): INR, PROTIME in the last 168 hours. Cardiac Enzymes: Recent Labs  Lab 09/22/24 0438  CKTOTAL 51   BNP (last 3 results) Recent Labs    09/22/24 0438  PROBNP <50.0    HbA1C: No results for input(s): HGBA1C in the last 72 hours. CBG: Recent Labs  Lab 09/22/24 0055 09/22/24 0804  GLUCAP 146* 135*   Lipid Profile: No results for input(s): CHOL, HDL, LDLCALC, TRIG, CHOLHDL, LDLDIRECT in the last 72 hours. Thyroid  Function Tests: No results for input(s): TSH, T4TOTAL, FREET4, T3FREE, THYROIDAB in the last 72 hours. Anemia Panel: No results for input(s): VITAMINB12, FOLATE, FERRITIN, TIBC, IRON , RETICCTPCT in the last 72 hours. Sepsis Labs: Recent Labs  Lab 09/21/24 1850 09/21/24 2133 09/22/24 0222 09/22/24 0438  LATICACIDVEN 3.5* 2.9* 2.1* 2.1*    Recent Results (from the past 240 hours)  Culture, blood (routine x 2)     Status: None (Preliminary result)   Collection Time: 09/21/24  5:53 PM   Specimen: Left Antecubital; Blood  Result Value Ref Range Status   Specimen Description LEFT ANTECUBITAL  Final   Special Requests   Final    BOTTLES DRAWN AEROBIC AND ANAEROBIC Blood Culture results may not be optimal due to an inadequate volume of blood received in culture bottles   Culture   Final    NO GROWTH < 24 HOURS Performed at [facility], [address] [place], [place] [address]    Report Status PENDING  Incomplete  Culture, blood (routine x 2)     Status: None (Preliminary result)   Collection Time: 09/21/24  5:53 PM   Specimen: Right Antecubital; Blood  Result Value Ref Range Status   Specimen Description RIGHT ANTECUBITAL  Final   Special Requests   Final    BOTTLES DRAWN AEROBIC AND ANAEROBIC Blood Culture adequate volume   Culture   Final    NO GROWTH < 24 HOURS Performed at [facility], [address], [place], [place] [address]    Report Status PENDING  Incomplete         Radiology Studies: CT Head Wo Contrast Result Date: 09/21/2024 CLINICAL DATA:  Status post fall. EXAM: CT HEAD WITHOUT CONTRAST TECHNIQUE: Contiguous axial images were obtained from the base  of the skull through the vertex without intravenous contrast. RADIATION DOSE REDUCTION: This exam was performed according to the departmental dose-optimization program which includes automated exposure control, adjustment of the mA and/or kV according to patient size and/or use of iterative reconstruction technique. COMPARISON:  July 04, 2020 FINDINGS: Brain: No evidence of acute infarction, hemorrhage, hydrocephalus, extra-axial collection or mass lesion/mass effect. Vascular: No hyperdense vessel or unexpected calcification. Skull: Normal. Negative for fracture or focal lesion. Sinuses/Orbits: No acute finding. Other: None. IMPRESSION: No acute intracranial pathology. Electronically Signed   By: Suzen Dials M.D.   On: 09/21/2024 18:46   DG Chest Portable 1 View Result Date: 09/21/2024 CLINICAL DATA:  Syncope. EXAM: PORTABLE  CHEST 1 VIEW COMPARISON:  September 01, 2024 FINDINGS: The heart size and mediastinal contours are within normal limits. Low lung volumes are noted. Both lungs are clear. Radiopaque surgical clips are seen overlying the right upper quadrant. The visualized skeletal structures are unremarkable. IMPRESSION: No active disease. Electronically Signed   By: Suzen Dials M.D.   On: 09/21/2024 17:31        Scheduled Meds:  amphetamine -dextroamphetamine   20 mg Oral q morning   cariprazine   4.5 mg Oral Daily   enoxaparin  (LOVENOX ) injection  50 mg Subcutaneous Q24H   hydrOXYzine   75 mg Oral QHS   insulin  aspart  0-15 Units Subcutaneous TID WC   insulin  aspart  0-5 Units Subcutaneous QHS   insulin  glargine  15 Units Subcutaneous Daily   linaclotide   290 mcg Oral Daily   mirtazapine   30 mg Oral QHS   montelukast   10 mg Oral QHS   pantoprazole   40 mg Oral Daily   pregabalin   150 mg Oral TID   topiramate   100 mg Oral BID   Continuous Infusions:  sodium chloride  75 mL/hr at 09/22/24 0052     LOS: 0 days      Anthony CHRISTELLA Pouch, MD [facility] Pager  [phone]  If 7PM-7AM, please contact night-coverage [email or web address] 09/22/2024, 10:29 AM   "

## 2024-09-22 NOTE — Inpatient Diabetes Management (Signed)
 Inpatient Diabetes Program Recommendations  AACE/ADA: New Consensus Statement on Inpatient Glycemic Control (2015)  Target Ranges:  Prepandial:   less than 140 mg/dL      Peak postprandial:   less than 180 mg/dL (1-2 hours)      Critically ill patients:  140 - 180 mg/dL   Lab Results  Component Value Date   GLUCAP 135 (H) 09/22/2024   HGBA1C 7.9 (H) 09/13/2020    Review of Glycemic Control  Latest Reference Range & Units 09/22/24 00:55 09/22/24 08:04  Glucose-Capillary 70 - 99 mg/dL 853 (H) 864 (H)  (H): Data is abnormally high  Diabetes history: DM2  Outpatient Diabetes medications:  Toujeo  62 units every day Humalog  0-6 units TID Humalog  3 units TID Ozempic 2 mg weekly Jardiance 25 mg every day  Current orders for Inpatient glycemic control:  Lantus  15 units every day Novolog  0-15 units TID and 0-5 units QHS  Inpatient Diabetes Program Recommendations:    Might consider obtaining current A1C.  Last A1C in chart 8.7% on 08/05/23.    Thank you, Wyvonna Pinal, MSN, CDCES Diabetes Coordinator Inpatient Diabetes Program [phone] (team pager from 8a-5p)

## 2024-09-22 NOTE — ED Notes (Signed)
 Pts bed alarm ringing out, writer in room to assist pt to bedside toilet. Pt slow but ambulatory to and from with urine output of .

## 2024-09-23 ENCOUNTER — Ambulatory Visit: Admission: RE | Admit: 2024-09-23 | Source: Home / Self Care | Admitting: Gastroenterology

## 2024-09-23 ENCOUNTER — Encounter: Admission: RE | Payer: Self-pay | Source: Home / Self Care

## 2024-09-23 ENCOUNTER — Observation Stay

## 2024-09-23 DIAGNOSIS — R569 Unspecified convulsions: Secondary | ICD-10-CM

## 2024-09-23 DIAGNOSIS — R4189 Other symptoms and signs involving cognitive functions and awareness: Secondary | ICD-10-CM | POA: Diagnosis not present

## 2024-09-23 LAB — LACTIC ACID, PLASMA: Lactic Acid, Venous: 2 mmol/L (ref 0.5–1.9)

## 2024-09-23 LAB — GLUCOSE, CAPILLARY
Glucose-Capillary: 172 mg/dL — ABNORMAL HIGH (ref 70–99)
Glucose-Capillary: 220 mg/dL — ABNORMAL HIGH (ref 70–99)
Glucose-Capillary: 121 mg/dL — ABNORMAL HIGH (ref 70–99)
Glucose-Capillary: 131 mg/dL — ABNORMAL HIGH (ref 70–99)

## 2024-09-23 LAB — CBG MONITORING, ED: Glucose-Capillary: 148 mg/dL — ABNORMAL HIGH (ref 70–99)

## 2024-09-23 SURGERY — COLONOSCOPY
Anesthesia: General

## 2024-09-23 MED ORDER — INFLUENZA VIRUS VACC SPLIT PF (FLUZONE) 0.5 ML IM SUSY
0.5000 mL | PREFILLED_SYRINGE | INTRAMUSCULAR | Status: AC
  Administered 2024-09-24: 0.5 mL via INTRAMUSCULAR
  Filled 2024-09-23: qty 0.5

## 2024-09-23 MED ORDER — INSULIN GLARGINE 100 UNIT/ML ~~LOC~~ SOLN
15.0000 [IU] | Freq: Every day | SUBCUTANEOUS | Status: DC
  Administered 2024-09-23 – 2024-09-24 (×2): 15 [IU] via SUBCUTANEOUS
  Filled 2024-09-23 (×2): qty 0.15

## 2024-09-23 MED ORDER — AMPHETAMINE-DEXTROAMPHET ER 5 MG PO CP24
20.0000 mg | ORAL_CAPSULE | Freq: Every morning | ORAL | Status: DC
  Administered 2024-09-23 – 2024-09-24 (×2): 20 mg via ORAL
  Filled 2024-09-23 (×2): qty 4

## 2024-09-23 NOTE — Care Management Obs Status (Signed)
 MEDICARE OBSERVATION STATUS NOTIFICATION   Patient Details  Name: Amanda [name] MRN: [id number] Date of Birth: [birth year]   Medicare Observation Status Notification Given:  Yes    Shae Augello W, CMA 09/23/2024, 4:43 PM

## 2024-09-23 NOTE — Progress Notes (Signed)
 " PROGRESS NOTE    Amanda [name]  FMW:[id number] DOB: 1966/05/01 DOA: 09/21/2024 PCP: Lorel Maxie LABOR, MD   Assessment & Plan:   Principal Problem:   Unresponsiveness Active Problems:   Epilepsy (HCC)   Elevated lactic acid level   Hypertension   Hyperlipidemia   Chronic diastolic CHF (congestive heart failure) (HCC)   COPD (chronic obstructive pulmonary disease) (HCC)   Diabetes mellitus without complication (HCC)   Fibromyalgia   Sleep apnea   Depression with anxiety   ADHD   Obesity (BMI 30-39.9)  Assessment and Plan: Unresponsiveness: etiology unclear, ddx  seizure vs syncope vs polypharmacy. No complaint w/ seizure meds but pt is unsure of why & pt's son's is unable to provide any hx of why either. CT head is negative for acute intracranial abnormalities. EEG ordered. Continue on home dose of topamax . Ativan  prn for seizures.   History of epilepsy: continue on home dose of topamax    Elevated lactic acid level: Likely due to dehydration, and seizure related lactic acidosis is also possible.  No fever or leukocytosis.  No signs of sepsis. Lactic acid is trending down    HTN: BP is currently WNL. Holding lasix    HLD: no longer taking a statin   Chronic diastolic CHF: echo on 05/09/2020 showed EF of 50-55%.  Does not have leg edema, clinically dry. Monitor I/Os. Holding lasix .     COPD: w/o exacerbation. Bronchodilators prn    DM2: Recent A1c 7.2, poorly controlled. Continue on glargine, SSI w/ accuchecks  Fibromyalgia and chronic pain: continue on home dose of pregabalin    OSA: CPAP qhs   Depression: severity unknown. Continue on home dose of cariprazine     ADHD: continue on home dose of adderall    Obesity: BMI 34.3. Would you benefit from weight       DVT prophylaxis: lovenox   Code Status: full  Family Communication: discussed pt's care w/ pt's son, [name], and answered his questions  Disposition Plan:  likely d/c back home   Level of care:  Progressive Consultants:    Procedures:   Antimicrobials:    Subjective: Pt c/o malaise   Objective: Vitals:   09/23/24 0635 09/23/24 0725 09/23/24 0800 09/23/24 0834  BP: 132/81  124/84 132/83  Pulse: 81  (!) 112 87  Resp: (!) 23  (!) 23 17  Temp:   98.1 F (36.7 C) 98.1 F (36.7 C)  TempSrc:   Oral Oral  SpO2: 100% 100% 100% 99%  Weight:      Height:       No intake or output data in the 24 hours ending 09/23/24 1036  Filed Weights   09/21/24 1701  Weight: 90.7 kg    Examination:  General exam: Appears comfortable  Respiratory system: Clear breath sounds b/l  Cardiovascular system: S1&S2+. No rubs or clicks  Gastrointestinal system: Abd is soft, NT, ND & hypoactive bowel sounds Central nervous system: alert & oriented. Moves all extremities  Psychiatry: Judgement and insight appears at baseline. Flat mood and affect    Data Reviewed: I have personally reviewed following labs and imaging studies  CBC: Recent Labs  Lab 09/21/24 1714 09/22/24 0438  WBC 5.4 5.0  NEUTROABS 2.8  --   HGB 13.0 12.4  HCT 40.1 38.9  MCV 85.5 88.0  PLT 318 310   Basic Metabolic Panel: Recent Labs  Lab 09/21/24 1714 09/22/24 0438  NA 137 142  K 3.8 3.5  CL 96* 104  CO2 27 27  GLUCOSE 316* 145*  BUN 12 9  CREATININE 1.02* 0.83  CALCIUM  9.6 9.5   GFR: Estimated Creatinine Clearance: 79.6 mL/min (by C-G formula based on SCr of 0.83 mg/dL). Liver Function Tests: Recent Labs  Lab 09/21/24 1714  AST 23  ALT 30  ALKPHOS 118  BILITOT 0.2  PROT 7.1  ALBUMIN 4.3   No results for input(s): LIPASE, AMYLASE in the last 168 hours. No results for input(s): AMMONIA in the last 168 hours. Coagulation Profile: No results for input(s): INR, PROTIME in the last 168 hours. Cardiac Enzymes: Recent Labs  Lab 09/22/24 0438  CKTOTAL 51   BNP (last 3 results) Recent Labs    09/22/24 0438  PROBNP <50.0   HbA1C: No results for input(s): HGBA1C in the last  72 hours. CBG: Recent Labs  Lab 09/22/24 1112 09/22/24 1701 09/22/24 2159 09/23/24 0719 09/23/24 0834  GLUCAP 153* 155* 215* 148* 172*   Lipid Profile: No results for input(s): CHOL, HDL, LDLCALC, TRIG, CHOLHDL, LDLDIRECT in the last 72 hours. Thyroid  Function Tests: No results for input(s): TSH, T4TOTAL, FREET4, T3FREE, THYROIDAB in the last 72 hours. Anemia Panel: No results for input(s): VITAMINB12, FOLATE, FERRITIN, TIBC, IRON , RETICCTPCT in the last 72 hours. Sepsis Labs: Recent Labs  Lab 09/22/24 0222 09/22/24 0438 09/22/24 1033 09/23/24 0728  LATICACIDVEN 2.1* 2.1* 2.2* 2.0*    Recent Results (from the past 240 hours)  Culture, blood (routine x 2)     Status: None (Preliminary result)   Collection Time: 09/21/24  5:53 PM   Specimen: Left Antecubital; Blood  Result Value Ref Range Status   Specimen Description LEFT ANTECUBITAL  Final   Special Requests   Final    BOTTLES DRAWN AEROBIC AND ANAEROBIC Blood Culture results may not be optimal due to an inadequate volume of blood received in culture bottles   Culture   Final    NO GROWTH 2 DAYS Performed at [facility], [address] [place], [place] [address]    Report Status PENDING  Incomplete  Culture, blood (routine x 2)     Status: None (Preliminary result)   Collection Time: 09/21/24  5:53 PM   Specimen: Right Antecubital; Blood  Result Value Ref Range Status   Specimen Description RIGHT ANTECUBITAL  Final   Special Requests   Final    BOTTLES DRAWN AEROBIC AND ANAEROBIC Blood Culture adequate volume   Culture   Final    NO GROWTH 2 DAYS Performed at [facility], [address], [place], [place] [address]    Report Status PENDING  Incomplete         Radiology Studies: CT Head Wo Contrast Result Date: 09/21/2024 CLINICAL DATA:  Status post fall. EXAM: CT HEAD WITHOUT CONTRAST TECHNIQUE: Contiguous axial images were obtained from the base of  the skull through the vertex without intravenous contrast. RADIATION DOSE REDUCTION: This exam was performed according to the departmental dose-optimization program which includes automated exposure control, adjustment of the mA and/or kV according to patient size and/or use of iterative reconstruction technique. COMPARISON:  July 04, 2020 FINDINGS: Brain: No evidence of acute infarction, hemorrhage, hydrocephalus, extra-axial collection or mass lesion/mass effect. Vascular: No hyperdense vessel or unexpected calcification. Skull: Normal. Negative for fracture or focal lesion. Sinuses/Orbits: No acute finding. Other: None. IMPRESSION: No acute intracranial pathology. Electronically Signed   By: Suzen Dials M.D.   On: 09/21/2024 18:46   DG Chest Portable 1 View Result Date: 09/21/2024 CLINICAL DATA:  Syncope. EXAM: PORTABLE CHEST 1 VIEW COMPARISON:  September 01, 2024 FINDINGS: The heart size and mediastinal contours are within normal limits. Low lung volumes are noted. Both lungs are clear. Radiopaque surgical clips are seen overlying the right upper quadrant. The visualized skeletal structures are unremarkable. IMPRESSION: No active disease. Electronically Signed   By: Suzen Dials M.D.   On: 09/21/2024 17:31        Scheduled Meds:  amphetamine -dextroamphetamine   20 mg Oral q morning   cariprazine   4.5 mg Oral Daily   enoxaparin  (LOVENOX ) injection  50 mg Subcutaneous Q24H   hydrOXYzine   75 mg Oral QHS   [START ON 09/24/2024] influenza vac split trivalent PF  0.5 mL Intramuscular Tomorrow-1000   insulin  aspart  0-15 Units Subcutaneous TID WC   insulin  aspart  0-5 Units Subcutaneous QHS   insulin  glargine  15 Units Subcutaneous Daily   linaclotide   290 mcg Oral Daily   mirtazapine   30 mg Oral QHS   montelukast   10 mg Oral QHS   pantoprazole   40 mg Oral Daily   pregabalin   150 mg Oral TID   topiramate   100 mg Oral BID   Continuous Infusions:  sodium chloride  75 mL/hr at 09/23/24  0914     LOS: 0 days      Anthony CHRISTELLA Pouch, MD [facility] Pager [phone]  If 7PM-7AM, please contact night-coverage [email or web address] 09/23/2024, 10:36 AM   "

## 2024-09-23 NOTE — Procedures (Signed)
 Patient Name: Amanda [name]  MRN: [id number]  Epilepsy Attending: Arlin MALVA Krebs  Referring Physician/Provider: Niu, Xilin, MD  Date: 09/23/2024 Duration: 36.21 mins  Patient history: 59yo F with an episode of unresponsiveness. EEG to evaluate for seizure  Level of alertness: Awake, drowsy  AEDs during EEG study: TPM, GBP  Technical aspects: This EEG study was done with scalp electrodes positioned according to the 10-20 International system of electrode placement. Electrical activity was reviewed with band pass filter of 1-70Hz , sensitivity of 7 uV/mm, display speed of 18mm/sec with a 60Hz  notched filter applied as appropriate. EEG data were recorded continuously and digitally stored.  Video monitoring was available and reviewed as appropriate.  Description: The posterior dominant rhythm consists of 8 Hz activity of moderate voltage (25-35 uV) seen predominantly in posterior head regions, symmetric and reactive to eye opening and eye closing. Drowsiness was characterized by attenuation of the posterior background rhythm. Hyperventilation and photic stimulation were not performed.     IMPRESSION: This study is within normal limits. No seizures or epileptiform discharges were seen throughout the recording.  A normal interictal EEG does not exclude the diagnosis of epilepsy.   [name]

## 2024-09-23 NOTE — Evaluation (Signed)
 Occupational Therapy Evaluation Patient Details Name: Amanda [name] MRN: [id number] DOB: Jul 19, 1966 Today's Date: 09/23/2024   History of Present Illness   59 y/o female presented to ED on 09/21/24 after fall and unresponsiveness. PMH: epilepsy, seizures, stroke, T2DM, COPD, dCHF, depression with anxiety, ADHD, chronic fatigue, hepatic steatosis, chronic pain, fibromyalgia, OSA on CPAP     Clinical Impressions Upon entering the room, pt supine in bed and agreeable to OT intervention. Pt lives at home with adult son who works second shift. He is able to assist at home if needed. Pt reports being Ind at baseline without use of AD. Pt demonstrates mobility and self care this session without assistance and pushes IV pole down hallway while ambulating 200' at mod I level. Pt endorses gait speed is decreases but overall feeling well and close to baseline. Pt with no skilled acute OT needs at this time. OT to complete order.       Functional Status Assessment   Patient has not had a recent decline in their functional status     Equipment Recommendations   None recommended by OT      Precautions/Restrictions   Precautions Precautions: None     Mobility Bed Mobility Overal bed mobility: Modified Independent                  Transfers Overall transfer level: Modified independent Equipment used: None                      Balance Overall balance assessment: Modified Independent                                         ADL either performed or assessed with clinical judgement   ADL Overall ADL's : Modified independent                                             Vision Patient Visual Report: No change from baseline              Pertinent Vitals/Pain Pain Assessment Pain Assessment: No/denies pain     Extremity/Trunk Assessment Upper Extremity Assessment Upper Extremity Assessment: Overall WFL for tasks assessed    Lower Extremity Assessment Lower Extremity Assessment: Defer to PT evaluation   Cervical / Trunk Assessment Cervical / Trunk Assessment: Normal   Communication Communication Communication: No apparent difficulties   Cognition Arousal: Alert Behavior During Therapy: WFL for tasks assessed/performed Cognition: No apparent impairments                               Following commands: Intact       Cueing  General Comments   Cueing Techniques: Verbal cues              Home Living Family/patient expects to be discharged to:: Private residence Living Arrangements: Children Available Help at Discharge: Family Type of Home: House Home Access: Stairs to enter Secretary/administrator of Steps: 3 Entrance Stairs-Rails: Right;Left;Can reach both Home Layout: One level     Bathroom Shower/Tub: Chief Strategy Officer: Standard     Home Equipment: None   Additional Comments: son works second shift and is able to assist her during the day  if needed      Prior Functioning/Environment Prior Level of Function : Independent/Modified Independent;Driving                            OT Goals(Current goals can be found in the care plan section)   Acute Rehab OT Goals Patient Stated Goal: to go home OT Goal Formulation: With patient Time For Goal Achievement: 09/23/24 Potential to Achieve Goals: Good   AM-PAC OT 6 Clicks Daily Activity     Outcome Measure Help from another person eating meals?: None Help from another person taking care of personal grooming?: None Help from another person toileting, which includes using toliet, bedpan, or urinal?: None Help from another person bathing (including washing, rinsing, drying)?: None Help from another person to put on and taking off regular upper body clothing?: None Help from another person to put on and taking off regular lower body clothing?: None 6 Click Score: 24   End of Session  Equipment Utilized During Treatment: Other (comment) (pushing IV pole) Nurse Communication: Mobility status  Activity Tolerance: Patient tolerated treatment well Patient left: in bed;with call bell/phone within reach;with bed alarm set                   Time: 8953-8895 OT Time Calculation (min): 18 min Charges:  OT General Charges $OT Visit: 1 Visit OT Evaluation $OT Eval Low Complexity: 1 Low OT Treatments $Therapeutic Activity: 8-22 mins Izetta Claude, MS, OTR/L , CBIS ascom [phone]  09/23/24, 2:47 PM

## 2024-09-23 NOTE — Progress Notes (Signed)
 Eeg done

## 2024-09-23 NOTE — TOC CM/SW Note (Signed)
 Transition of Care [facility]) - Inpatient Brief Assessment   Patient Details  Name: Amanda [name] MRN: [id number] Date of Birth: [birth year]  Transition of Care [facility]) CM/SW Contact:    Shasta DELENA Daring, RN Phone Number: 09/23/2024, 10:24 AM   Clinical Narrative: Transition of Care Department [facility]) has reviewed patient and no TOC needs have been identified at this time.  If new patient transition needs arise, please place a TOC consult.    Transition of Care Asessment: Insurance and Status: Insurance coverage has been reviewed Patient has primary care physician: Yes   Prior level of function:: independent   Social Drivers of Health Review: SDOH reviewed no interventions necessary Readmission risk has been reviewed: Yes Transition of care needs: no transition of care needs at this time

## 2024-09-23 NOTE — Progress Notes (Signed)
 Patient ordered on CPAP at night. Patient stated hasn't worn one at home in years.

## 2024-09-23 NOTE — Evaluation (Signed)
 Physical Therapy Evaluation Patient Details Name: Amanda [name] MRN: [id number] DOB: 07/08/1966 Today's Date: 09/23/2024  History of Present Illness  59 y/o female presented to ED on 09/21/24 after fall and unresponsiveness. PMH: epilepsy, seizures, stroke, T2DM, COPD, dCHF, depression with anxiety, ADHD, chronic fatigue, hepatic steatosis, chronic pain, fibromyalgia, OSA on CPAP  Clinical Impression  Patient admitted with the above. 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 Patient reports feeling slower than normal (thoughts, mobility, and speech). Completed bed mobility and sit to stand modI. Ambulated 200' with no AD and supervision initially, however able to progress to modI. Patient reports feeling unsteady but no LOB or drifting noted throughout. No further skilled PT needs identified acutely. No PT follow up recommended at this time.         Equipment Recommendations None recommended by PT  Recommendations for Other Services       Functional Status Assessment Patient has not had a recent decline in their functional status     Precautions / Restrictions Precautions Precautions: None Restrictions Weight Bearing Restrictions Per Provider Order: No      Mobility  Bed Mobility Overal bed mobility: Modified Independent                  Transfers Overall transfer level: Modified independent Equipment used: None                    Ambulation/Gait Ambulation/Gait assistance: Supervision, Modified independent (Device/Increase time) Gait Distance (Feet): 200 Feet Assistive device: None Gait Pattern/deviations: Step-through pattern, Decreased stride length Gait velocity: decreased     General Gait Details: patient reports feeling unsteady however no drifting or LOB noted.      Balance Overall balance assessment: Modified Independent         Pertinent Vitals/Pain Pain Assessment Pain Assessment:  No/denies pain    Home Living Family/patient expects to be discharged to:: Private residence Living Arrangements: Children Available Help at Discharge: Family Type of Home: House Home Access: Stairs to enter   Secretary/administrator of Steps: 3            Prior Function Prior Level of Function : Independent/Modified Independent         Extremity/Trunk Assessment   Upper Extremity Assessment Upper Extremity Assessment: Defer to OT evaluation    Lower Extremity Assessment Lower Extremity Assessment: Generalized weakness    Cervical / Trunk Assessment Cervical / Trunk Assessment: Normal  Communication   Communication Communication: No apparent difficulties    Cognition Arousal: Alert Behavior During Therapy: WFL for tasks assessed/performed   PT - Cognitive impairments: No apparent impairments                         Following commands: Intact        Assessment/Plan    PT Assessment Patient does not need any further PT services  PT Problem List         PT Treatment Interventions      PT Goals (Current goals can be found in the Care Plan section)  Acute Rehab PT Goals Patient Stated Goal: to go home PT Goal Formulation: All assessment and education complete, DC therapy     AM-PAC PT 6 Clicks Mobility  Outcome Measure Help needed turning from your back to your side while in a flat bed without using bedrails?: None Help needed moving from lying on your back to sitting on  the side of a flat bed without using bedrails?: None Help needed moving to and from a bed to a chair (including a wheelchair)?: None Help needed standing up from a chair using your arms (e.g., wheelchair or bedside chair)?: None Help needed to walk in hospital room?: None Help needed climbing 3-5 steps with a railing? : None 6 Click Score: 24    End of Session   Activity Tolerance: Patient tolerated treatment well Patient left: in bed;with call bell/phone within  reach;with nursing/sitter in room Nurse Communication: Mobility status PT Visit Diagnosis: Muscle weakness (generalized) (M62.81);Unsteadiness on feet (R26.81)    Time: 1211-1220 PT Time Calculation (min) (ACUTE ONLY): 9 min   Charges:   PT Evaluation $PT Eval Low Complexity: 1 Low   PT General Charges $$ ACUTE PT VISIT: 1 Visit         Maryanne Finder, PT, DPT Physical Therapist - [place]   Dwyane Dupree A Yael Angerer 09/23/2024, 1:50 PM

## 2024-09-24 ENCOUNTER — Other Ambulatory Visit: Payer: Self-pay

## 2024-09-24 ENCOUNTER — Other Ambulatory Visit (HOSPITAL_COMMUNITY): Payer: Self-pay

## 2024-09-24 ENCOUNTER — Telehealth (HOSPITAL_COMMUNITY): Payer: Self-pay

## 2024-09-24 LAB — GLUCOSE, CAPILLARY
Glucose-Capillary: 405 mg/dL — ABNORMAL HIGH (ref 70–99)
Glucose-Capillary: 121 mg/dL — ABNORMAL HIGH (ref 70–99)

## 2024-09-24 MED ORDER — PREGABALIN 150 MG PO CAPS
150.0000 mg | ORAL_CAPSULE | Freq: Three times a day (TID) | ORAL | 0 refills | Status: AC
  Filled 2024-09-24: qty 90, 30d supply, fill #0

## 2024-09-24 MED ORDER — TOPIRAMATE 100 MG PO TABS
100.0000 mg | ORAL_TABLET | Freq: Two times a day (BID) | ORAL | 0 refills | Status: AC
  Filled 2024-09-24: qty 60, 30d supply, fill #0

## 2024-09-24 NOTE — Telephone Encounter (Signed)
 Pharmacy Patient Advocate Encounter  Insurance verification completed.    The patient is insured through [facility]. Patient has Medicare and is not eligible for a copay card, but may be able to apply for patient assistance or Medicare RX Payment Plan (Patient Must reach out to their plan, if eligible for payment plan), if available.    Ran test claim for Lyrica  200mg  #90 and the current 30 day co-pay is $1.60.  Ran test claim for Topiramate  100mg  tablet and the current 30 day co-pay is $0.  This test claim was processed through [employer]- copay amounts may vary at other pharmacies due to [employer], or as the patient moves through the different stages of their insurance plan.

## 2024-09-24 NOTE — Plan of Care (Signed)

## 2024-09-24 NOTE — TOC CM/SW Note (Addendum)
 Patient has orders to discharge home today. Per RN, patient unable to pay for a cab and her family is unable to transport her home. Cab voucher filled out and faxed to [employer]. RN confirmed address on facesheet is correct. No further concerns. CSW signing off.  2:20 pm: SDOH flags for food, housing, utilities, and transportation. CSW added resources to AVS but patient has already left.  Lauraine Carpen, CSW [phone]

## 2024-09-24 NOTE — Discharge Summary (Signed)
 Physician Discharge Summary  Amanda [name] FMW:[id number] DOB: [birth year] DOA: 09/21/2024  PCP: Lorel Maxie LABOR, MD  Admit date: 09/21/2024 Discharge date: 09/24/2024  Admitted From: home Disposition:  home   Recommendations for Outpatient Follow-up:  Follow up with PCP in 1-2 weeks F/u w/ neuro in 1-2 weeks  Home Health: no  Equipment/Devices:  Discharge Condition: stable  CODE STATUS: full  Diet recommendation: Heart Healthy / Carb Modified   Brief/Interim Summary: HPI was taken from Dr. Hilma: Amanda [name] is a 59 y.o. female with medical history significant of epilepsy on Topamax , HLD, DM, COPD, dCHF, stroke, depression with anxiety, ADHD, chronic fatigue, hepatic steatosis, hemorrhoid, chronic pain/fibromyalgia, GI bleeding, GERD, who presents with unresponsiveness.   Per report, pt drove home and was attempting to get out of the car when she suddenly fell out of the car with unresponsiveness. Family found her lying on the ground and called 911. Patient was drowsy but alert when EMS arrived. She was given 2 mg of Narcan by EMS for pinpoint pupils with no change of mental status. When I saw pt in ED, she is alert oriented x 3.  She answered all questions appropriately.  She states that she did not take her seizure medication Topamax  yesterday.  She thought she could have had seizure since she had shaking.  Currently patient does not have unilateral numbness or tingling in extremities.  No facial droop or slurred speech.  Patient does not have chest pain, cough, SOB.  She states she had nausea and vomited few times, which has resolved.  No diarrhea or abdominal pain.  No symptoms UTI.  No fever or chills.   Data reviewed independently and ED Course: pt was found to have WBC 5.4, GFR> 60, troponin 8 --> 10, lactic acid 3.1 --> 3.5 --> 2.9, UA negative except for rare bacteria, negative UDS, alcohol level less than 15.  Temperature normal, blood pressure 125/76, heart rate 69, RR 18, oxygen sat  99% on room air currently.  Chest x-ray negative.  CT of head negative for acute intracranial abnormalities patient is placed in PCU for observation.  Discharge Diagnoses:  Principal Problem:   Unresponsiveness Active Problems:   Epilepsy (HCC)   Elevated lactic acid level   Hypertension   Hyperlipidemia   Chronic diastolic CHF (congestive heart failure) (HCC)   COPD (chronic obstructive pulmonary disease) (HCC)   Diabetes mellitus without complication (HCC)   Fibromyalgia   Sleep apnea   Depression with anxiety   ADHD   Obesity (BMI 30-39.9) Unresponsiveness: etiology unclear, ddx  seizure vs syncope vs polypharmacy. No complaint w/ seizure meds but pt is unsure of why & pt's son's is unable to provide any hx of why either. CT head is negative for acute intracranial abnormalities. EEG was WNL, no seizures or epileptiform discharges were seen throughout recording. Continue on home dose of topamax . Ativan  prn for seizures.    History of epilepsy: continue on home dose of topamax    Elevated lactic acid level: Likely due to dehydration, and seizure related lactic acidosis is also possible.  No fever or leukocytosis.  No signs of sepsis. Lactic acid is trending down    HTN: BP is currently WNL.    HLD: no longer taking a statin   Chronic diastolic CHF: echo on 05/09/2020 showed EF of 50-55%.  Does not have leg edema, clinically dry. Monitor I/Os. No longer takes lasix  as per med rec     COPD: w/o exacerbation. Bronchodilators prn  DM2: Recent A1c 7.2, poorly controlled. Continue on glargine, SSI w/ accuchecks   Fibromyalgia and chronic pain: continue on home dose of pregabalin    OSA: CPAP qhs   Depression: severity unknown. Continue on home dose of cariprazine     ADHD: continue on home dose of adderall     Obesity: BMI 34.3. Would you benefit from weight      Discharge Instructions  Discharge Instructions     Diet - low sodium heart healthy   Complete by: As directed     Diet Carb Modified   Complete by: As directed    Discharge instructions   Complete by: As directed    F/u w/ PCP in 1-2 weeks. F/u w/ your neurologist in 1-2 weeks.   Increase activity slowly   Complete by: As directed       Allergies as of 09/24/2024       Reactions   Penicillins Anaphylaxis   Has patient had a PCN reaction causing immediate rash, facial/tongue/throat swelling, SOB or lightheadedness with hypotension: Yes Has patient had a PCN reaction causing severe rash involving mucus membranes or skin necrosis: No Has patient had a PCN reaction that required hospitalization No Has patient had a PCN reaction occurring within the last 10 years: No If all of the above answers are NO, then may proceed with Cephalosporin use. Has patient had a PCN reaction causing immediate rash, facial/tongue/throat swelling, SOB or lightheadedness with hypotension: Yes Has patient had a PCN reaction causing severe rash involving mucus membranes or skin necrosis: No Has patient had a PCN reaction that required hospitalization No Has patient had a PCN reaction occurring within the last 10 years: No If all of the above answers are NO, then may proceed with Cephalosporin use. Other reaction(s): Other (See Comments) Severe/almost died Other reaction(s): ANAPHYLAXIS Has patient had a PCN reaction causing immediate rash, facial/tongue/throat swelling, SOB or lightheadedness with hypotension: Yes Has patient had a PCN reaction causing severe rash involving mucus membranes or skin necrosis: No Has patient had a PCN reaction that required hospitalization No Has patient had a PCN reaction occurring within the last 10 years: No If all of the above answers are NO, then may proceed with Cephalosporin use. Other reaction(s): Other (See Comments) Severe/almost died Other reaction(s): ANAPHYLAXIS Has patient had a PCN reaction causing immediate rash, facial/tongue/throat swelling, SOB or lightheadedness with  hypotension: Yes Has patient had a PCN reaction causing severe rash involving mucus membranes or skin necrosis: No Has patient had a PCN reaction that required hospitalization No Has patient had a PCN reaction occurring within the last 10 years: No If all of the above answers are NO, then may proceed with Cephalospo... (TRUNCATED)   Penicillins Anaphylaxis   Ace Inhibitors Swelling   Ace Inhibitors Swelling        Medication List     STOP taking these medications    hydrOXYzine  100 MG capsule Commonly known as: VISTARIL        TAKE these medications    albuterol  108 (90 Base) MCG/ACT inhaler Commonly known as: VENTOLIN  HFA Inhale 2 puffs into the lungs every 6 (six) hours as needed for wheezing or shortness of breath.   amphetamine -dextroamphetamine  20 MG 24 hr capsule Commonly known as: ADDERALL  XR Take 20 mg by mouth every morning.   Eszopiclone 3 MG Tabs Take 3 mg by mouth at bedtime.   hydrocortisone  25 MG suppository Commonly known as: ANUSOL -HC Place 1 suppository (25 mg total) rectally 2 (two) times daily.  hydrOXYzine  25 MG tablet Commonly known as: ATARAX  Take 12.5-75 mg by mouth daily. Take 12.5 mg (one-half tablet) to 25 mg (one tablet) by mouth twice daily and take 75 mg (three tablets) at bedtime   insulin  lispro 100 UNIT/ML KwikPen Commonly known as: HUMALOG  Inject 3 Units into the skin 3 (three) times daily with meals. If eating and Blood Glucose (BG) 80 or higher inject 3 units for meal coverage and add correction dose per scale. If not eating, correction dose only. BG <150= 0 unit; BG 150-200= 1 unit; BG 201-250= 2 unit; BG 251-300= 3 unit; BG 301-350= 4 unit; BG 351-400= 5 unit; BG >400= 6 unit and Call Primary Care.   Insupen Pen Needles 32G X 4 MM Misc Generic drug: Insulin  Pen Needle Use 3 (three) times daily.   Jardiance 25 MG Tabs tablet Generic drug: empagliflozin Take 25 mg by mouth daily.   Linzess  290 MCG Caps capsule Generic drug:  linaclotide  Take 290 mcg by mouth daily.   LORazepam  1 MG tablet Commonly known as: ATIVAN  Take 1 mg by mouth 2 (two) times daily as needed for anxiety.   meloxicam  15 MG tablet Commonly known as: MOBIC  Take 15 mg by mouth daily.   mirtazapine  30 MG disintegrating tablet Commonly known as: REMERON  SOL-TAB Take 30 mg by mouth at bedtime. What changed: Another medication with the same name was removed. Continue taking this medication, and follow the directions you see here.   montelukast  10 MG tablet Commonly known as: SINGULAIR  Take 10 mg by mouth at bedtime.   polyethylene glycol powder 17 GM/SCOOP powder Commonly known as: GLYCOLAX /MIRALAX  Take 17 g by mouth daily as needed for mild constipation. Dissolve 1 capful (17g) in 4-8 ounces of liquid and take by mouth daily.   pregabalin  150 MG capsule Commonly known as: LYRICA  Take 1 capsule (150 mg total) by mouth 3 (three) times daily.   SUMAtriptan  50 MG tablet Commonly known as: Imitrex  Take 1 tablet (50 mg total) by mouth every 2 (two) hours as needed for migraine. May repeat in 2 hours if headache persists or recurs.  Not to exceed 4 pills in a 24-hour period.  If you find you need more medication and you have taken 4 pills, please go directly to the emergency room or call 911.   tiZANidine  4 MG tablet Commonly known as: ZANAFLEX  Take 4 mg by mouth every 8 (eight) hours.   topiramate  100 MG tablet Commonly known as: Topamax  Take 1 tablet (100 mg total) by mouth 2 (two) times daily.   Toujeo  Max SoloStar 300 UNIT/ML Solostar Pen Generic drug: insulin  glargine (2 Unit Dial ) Inject 62 Units into the skin every morning.   Vraylar  4.5 MG Caps Generic drug: Cariprazine  HCl Take 1 capsule by mouth daily.        Allergies[1]  Consultations:   Procedures/Studies: EEG adult Result Date: 09/23/2024 [name] Arlin KIDD, MD     09/23/2024  3:06 PM Patient Name: Amanda [name] MRN: [id number] Epilepsy Attending: Arlin KIDD [name] Referring Physician/Provider: Niu, Xilin, MD Date: 09/23/2024 Duration: 36.21 mins Patient history: 59yo F with an episode of unresponsiveness. EEG to evaluate for seizure Level of alertness: Awake, drowsy AEDs during EEG study: TPM, GBP Technical aspects: This EEG study was done with scalp electrodes positioned according to the 10-20 International system of electrode placement. Electrical activity was reviewed with band pass filter of 1-70Hz , sensitivity of 7 uV/mm, display speed of 65mm/sec with a 60Hz  notched filter applied as appropriate.  EEG data were recorded continuously and digitally stored.  Video monitoring was available and reviewed as appropriate. Description: The posterior dominant rhythm consists of 8 Hz activity of moderate voltage (25-35 uV) seen predominantly in posterior head regions, symmetric and reactive to eye opening and eye closing. Drowsiness was characterized by attenuation of the posterior background rhythm. Hyperventilation and photic stimulation were not performed.   IMPRESSION: This study is within normal limits. No seizures or epileptiform discharges were seen throughout the recording. A normal interictal EEG does not exclude the diagnosis of epilepsy. Priyanka O Yadav   CT Head Wo Contrast Result Date: 09/21/2024 CLINICAL DATA:  Status post fall. EXAM: CT HEAD WITHOUT CONTRAST TECHNIQUE: Contiguous axial images were obtained from the base of the skull through the vertex without intravenous contrast. RADIATION DOSE REDUCTION: This exam was performed according to the departmental dose-optimization program which includes automated exposure control, adjustment of the mA and/or kV according to patient size and/or use of iterative reconstruction technique. COMPARISON:  July 04, 2020 FINDINGS: Brain: No evidence of acute infarction, hemorrhage, hydrocephalus, extra-axial collection or mass lesion/mass effect. Vascular: No hyperdense vessel or unexpected calcification. Skull:  Normal. Negative for fracture or focal lesion. Sinuses/Orbits: No acute finding. Other: None. IMPRESSION: No acute intracranial pathology. Electronically Signed   By: Suzen Dials M.D.   On: 09/21/2024 18:46   DG Chest Portable 1 View Result Date: 09/21/2024 CLINICAL DATA:  Syncope. EXAM: PORTABLE CHEST 1 VIEW COMPARISON:  September 01, 2024 FINDINGS: The heart size and mediastinal contours are within normal limits. Low lung volumes are noted. Both lungs are clear. Radiopaque surgical clips are seen overlying the right upper quadrant. The visualized skeletal structures are unremarkable. IMPRESSION: No active disease. Electronically Signed   By: Suzen Dials M.D.   On: 09/21/2024 17:31   CT CHEST ABDOMEN PELVIS W CONTRAST Result Date: 09/02/2024 EXAM: CT CHEST, ABDOMEN AND PELVIS WITH CONTRAST 09/02/2024 01:29:45 AM TECHNIQUE: CT of the chest, abdomen and pelvis was performed with the administration of 100 mL of iohexol  (OMNIPAQUE ) 300 MG/ML solution intravenous contrast. Multiplanar reformatted images are provided for review. Automated exposure control, iterative reconstruction, and/or weight based adjustment of the mA/kV was utilized to reduce the radiation dose to as low as reasonably achievable. COMPARISON: Remote prior examination of 08/23/2017. CLINICAL HISTORY: resp infection sx, hypotension, and abd pain w LGIB FINDINGS: CHEST: MEDIASTINUM AND LYMPH NODES: Small pericardial effusion. The central airways are clear. No mediastinal, hilar or axillary lymphadenopathy. LUNGS AND PLEURA: No focal consolidation or pulmonary edema. No pleural effusion. No pneumothorax. ABDOMEN AND PELVIS: LIVER: Moderate hepatic steatosis. Mild hepatomegaly. Scattered hypodensities within the left hepatic lobe are stable since remote prior examination of 08/23/2017 and are benign, likely multiple cysts. GALLBLADDER AND BILE DUCTS: Status post cholecystectomy. No biliary ductal dilatation. SPLEEN: No acute abnormality.  PANCREAS: No acute abnormality. ADRENAL GLANDS: No acute abnormality. KIDNEYS, URETERS AND BLADDER: No stones in the kidneys or ureters. No hydronephrosis. No perinephric or periureteral stranding. Urinary bladder is unremarkable. GI AND BOWEL: Status post appendectomy. The stomach, small bowel, and large bowel are otherwise unremarkable. REPRODUCTIVE ORGANS: Status post hysterectomy. No adnexal masses. PERITONEUM AND RETROPERITONEUM: Umbilical hernia repair with mesh. Small fat-containing supraumbilical ventral hernia just superior to the mesh. No ascites. No free air. VASCULATURE: Aorta is normal in caliber. ABDOMINAL AND PELVIS LYMPH NODES: No lymphadenopathy. BONES AND SOFT TISSUES: Osseous structures are age appropriate. No acute bone abnormality. No lytic or blastic bone lesion. No focal soft tissue abnormality. IMPRESSION: 1.  No acute CT abnormality identified in the chest, abdomen, or pelvis to explain the reported symptoms, including no acute bowel abnormality. 2. Small pericardial effusion. 3. Moderate hepatic steatosis with mild hepatomegaly. 4. Stable benign-appearing left hepatic lobe hypodensities since 08/23/2017, likely multiple cysts. 5. Small fat-containing supraumbilical ventral hernia, just superior to the prior umbilical hernia repair mesh. 6. Status post cholecystectomy, appendectomy, and hysterectomy. Electronically signed by: Dorethia Molt MD 09/02/2024 01:52 AM EST RP Workstation: [id number]   DG Chest Port 1 View Result Date: 09/01/2024 EXAM: 1 VIEW(S) XRAY OF THE CHEST 09/01/2024 11:35:00 PM COMPARISON: 10/06/2023 CLINICAL HISTORY: Questionable sepsis - evaluate for abnormality FINDINGS: LUNGS AND PLEURA: Low lung volumes. Mild pulmonary edema. No focal pulmonary opacity. No pleural effusion. No pneumothorax. HEART AND MEDIASTINUM: Mild cardiomegaly. No acute abnormality of the mediastinal silhouette. BONES AND SOFT TISSUES: No acute osseous abnormality. IMPRESSION: 1. Mild pulmonary  edema and mild cardiomegaly. Electronically signed by: Greig Pique MD 09/01/2024 11:59 PM EST RP Workstation: [id number]   (Echo, Carotid, EGD, Colonoscopy, ERCP)    Subjective: Pt denies any pain.    Discharge Exam: Vitals:   09/24/24 0901 09/24/24 1209  BP: (!) 152/78 (!) 145/82  Pulse: 79 100  Resp: 16 16  Temp: 99.1 F (37.3 C) 97.9 F (36.6 C)  SpO2: 100% 100%   Vitals:   09/24/24 0420 09/24/24 0616 09/24/24 0901 09/24/24 1209  BP: 133/71  (!) 152/78 (!) 145/82  Pulse: 72  79 100  Resp: 20  16 16   Temp: 98.1 F (36.7 C)  99.1 F (37.3 C) 97.9 F (36.6 C)  TempSrc:      SpO2: 100%  100% 100%  Weight:  87.8 kg    Height:        General: Pt is alert, awake, not in acute distress Cardiovascular: S1/S2 +, no rubs, no gallops Respiratory: CTA bilaterally, no wheezing, no rhonchi Abdominal: Soft, NT, obese, bowel sounds + Extremities: no edema, no cyanosis    The results of significant diagnostics from this hospitalization (including imaging, microbiology, ancillary and laboratory) are listed below for reference.     Microbiology: Recent Results (from the past 240 hours)  Culture, blood (routine x 2)     Status: None (Preliminary result)   Collection Time: 09/21/24  5:53 PM   Specimen: Left Antecubital; Blood  Result Value Ref Range Status   Specimen Description LEFT ANTECUBITAL  Final   Special Requests   Final    BOTTLES DRAWN AEROBIC AND ANAEROBIC Blood Culture results may not be optimal due to an inadequate volume of blood received in culture bottles   Culture   Final    NO GROWTH 3 DAYS Performed at [facility], [address], [place], [place] [address]    Report Status PENDING  Incomplete  Culture, blood (routine x 2)     Status: None (Preliminary result)   Collection Time: 09/21/24  5:53 PM   Specimen: Right Antecubital; Blood  Result Value Ref Range Status   Specimen Description RIGHT ANTECUBITAL  Final   Special Requests   Final     BOTTLES DRAWN AEROBIC AND ANAEROBIC Blood Culture adequate volume   Culture   Final    NO GROWTH 3 DAYS Performed at [facility], [address] [place], [place] [address]    Report Status PENDING  Incomplete     Labs: BNP (last 3 results) No results for input(s): BNP in the last 8760 hours. Basic Metabolic Panel: Recent Labs  Lab 09/21/24  1714 09/22/24 0438  NA 137 142  K 3.8 3.5  CL 96* 104  CO2 27 27  GLUCOSE 316* 145*  BUN 12 9  CREATININE 1.02* 0.83  CALCIUM  9.6 9.5   Liver Function Tests: Recent Labs  Lab 09/21/24 1714  AST 23  ALT 30  ALKPHOS 118  BILITOT 0.2  PROT 7.1  ALBUMIN 4.3   No results for input(s): LIPASE, AMYLASE in the last 168 hours. No results for input(s): AMMONIA in the last 168 hours. CBC: Recent Labs  Lab 09/21/24 1714 09/22/24 0438  WBC 5.4 5.0  NEUTROABS 2.8  --   HGB 13.0 12.4  HCT 40.1 38.9  MCV 85.5 88.0  PLT 318 310   Cardiac Enzymes: Recent Labs  Lab 09/22/24 0438  CKTOTAL 51   BNP: Invalid input(s): POCBNP CBG: Recent Labs  Lab 09/23/24 1224 09/23/24 1628 09/23/24 2123 09/24/24 0901 09/24/24 1208  GLUCAP 220* 121* 131* 405* 121*   D-Dimer No results for input(s): DDIMER in the last 72 hours. Hgb A1c No results for input(s): HGBA1C in the last 72 hours. Lipid Profile No results for input(s): CHOL, HDL, LDLCALC, TRIG, CHOLHDL, LDLDIRECT in the last 72 hours. Thyroid  function studies No results for input(s): TSH, T4TOTAL, T3FREE, THYROIDAB in the last 72 hours.  Invalid input(s): FREET3 Anemia work up No results for input(s): VITAMINB12, FOLATE, FERRITIN, TIBC, IRON , RETICCTPCT in the last 72 hours. Urinalysis    Component Value Date/Time   COLORURINE YELLOW (A) 09/21/2024 1850   APPEARANCEUR CLEAR (A) 09/21/2024 1850   APPEARANCEUR Cloudy 11/12/2011 1721   LABSPEC 1.007 09/21/2024 1850   LABSPEC 1.010 11/12/2011 1721   PHURINE 5.0  09/21/2024 1850   GLUCOSEU >=500 (A) 09/21/2024 1850   GLUCOSEU Negative 11/12/2011 1721   HGBUR NEGATIVE 09/21/2024 1850   BILIRUBINUR NEGATIVE 09/21/2024 1850   BILIRUBINUR Negative 11/12/2011 1721   KETONESUR NEGATIVE 09/21/2024 1850   PROTEINUR NEGATIVE 09/21/2024 1850   NITRITE NEGATIVE 09/21/2024 1850   LEUKOCYTESUR NEGATIVE 09/21/2024 1850   LEUKOCYTESUR 1+ 11/12/2011 1721   Sepsis Labs Recent Labs  Lab 09/21/24 1714 09/22/24 0438  WBC 5.4 5.0   Microbiology Recent Results (from the past 240 hours)  Culture, blood (routine x 2)     Status: None (Preliminary result)   Collection Time: 09/21/24  5:53 PM   Specimen: Left Antecubital; Blood  Result Value Ref Range Status   Specimen Description LEFT ANTECUBITAL  Final   Special Requests   Final    BOTTLES DRAWN AEROBIC AND ANAEROBIC Blood Culture results may not be optimal due to an inadequate volume of blood received in culture bottles   Culture   Final    NO GROWTH 3 DAYS Performed at [facility], [address] [place], [place] [address]    Report Status PENDING  Incomplete  Culture, blood (routine x 2)     Status: None (Preliminary result)   Collection Time: 09/21/24  5:53 PM   Specimen: Right Antecubital; Blood  Result Value Ref Range Status   Specimen Description RIGHT ANTECUBITAL  Final   Special Requests   Final    BOTTLES DRAWN AEROBIC AND ANAEROBIC Blood Culture adequate volume   Culture   Final    NO GROWTH 3 DAYS Performed at [facility], [address], [place], [place] [address]    Report Status PENDING  Incomplete     Time coordinating discharge: 35 minutes  SIGNED:   Anthony CHRISTELLA Pouch, MD  [facility] 09/24/2024, 1:25  PM Pager   If 7PM-7AM, please contact night-coverage [email or web address]      [1]  Allergies Allergen Reactions   Penicillins Anaphylaxis    Has patient had a PCN reaction causing immediate rash, facial/tongue/throat swelling, SOB or  lightheadedness with hypotension: Yes Has patient had a PCN reaction causing severe rash involving mucus membranes or skin necrosis: No Has patient had a PCN reaction that required hospitalization No Has patient had a PCN reaction occurring within the last 10 years: No If all of the above answers are NO, then may proceed with Cephalosporin use.  Has patient had a PCN reaction causing immediate rash, facial/tongue/throat swelling, SOB or lightheadedness with hypotension: Yes Has patient had a PCN reaction causing severe rash involving mucus membranes or skin necrosis: No Has patient had a PCN reaction that required hospitalization No Has patient had a PCN reaction occurring within the last 10 years: No If all of the above answers are NO, then may proceed with Cephalosporin use. Other reaction(s): Other (See Comments) Severe/almost died Other reaction(s): ANAPHYLAXIS Has patient had a PCN reaction causing immediate rash, facial/tongue/throat swelling, SOB or lightheadedness with hypotension: Yes Has patient had a PCN reaction causing severe rash involving mucus membranes or skin necrosis: No Has patient had a PCN reaction that required hospitalization No Has patient had a PCN reaction occurring within the last 10 years: No If all of the above answers are NO, then may proceed with Cephalosporin use. Other reaction(s): Other (See Comments) Severe/almost died Other reaction(s): ANAPHYLAXIS Has patient had a PCN reaction causing immediate rash, facial/tongue/throat swelling, SOB or lightheadedness with hypotension: Yes Has patient had a PCN reaction causing severe rash involving mucus membranes or skin necrosis: No Has patient had a PCN reaction that required hospitalization No Has patient had a PCN reaction occurring within the last 10 years: No If all of the above answers are NO, then may proceed with Cephalospo... (TRUNCATED)   Penicillins Anaphylaxis   Ace Inhibitors Swelling   Ace  Inhibitors Swelling

## 2024-09-24 NOTE — Discharge Instructions (Addendum)
 Follow up with PCP and pick up medications from pharmacy as appropriate. Make sure to take all antibiotics if ordered. Do not double up on narcotic/pain medication or drink alcohol during this time. Do not drive while taking narcotic medication. Call 911 or return to ER for life threatening issues or other concerns.   Food Resources  Agency Name: [facility] Address: [address] [place], [place] [address] Phone: [phone] Website: [email or web address] Service(s) Offered: Housing services, self-sufficiency, congregate meal program, weatherization program, event organiser program, emergency food assistance,  housing counseling, home ownership program, wheels - to work program.  [employer] free for 60 and older at various locations from usaa, Monday-Friday:  [employer], [address] [place], [phone] -[facility], [address], [place] [phone]  -[facility], [address] [place] [phone]  -[address] [place], [phone]  Agency Name: [facility] Address: [phone] [address], [place], [place] [address] Phone: [phone] Website: [email or web address] Service(s) Offered: Home delivered hot, frozen, and emergency  meals. Grocery assistance program which matches  volunteers one-on-one with seniors unable to grocery shop  for themselves. Must be 60 years and older; less than 20  hours of in-home aide service, limited or no driving ability;  live alone or with someone with a disability; live in  [place].  Agency Name: [facility]) Address: [address] [place], [place] [address] Phone: [phone] Service(s) Offered: Food is served to shut-ins, homeless, elderly, and low income people in the community every Saturday (11:30 am-12:30 pm) and Sunday (12:30 pm-1:30pm). Volunteers also offer help  and encouragement in seeking employment,  and spiritual guidance.  Agency Name: Department of Social Services Address: [address] [place], [place] [address] Phone: [phone] Service(s) Offered: Child support services; child welfare services; food stamps; Medicaid; work first family assistance; and aid with fuel,  rent, food and medicine.  Agency Name: [facility]itian Address: [address], [place], [place] Phone: [phone] Website: [email or web address] Services Offered: Monday 10:00am-12:00, 8:00pm-9:00pm, and Friday 10:00am-12:00.  Agency Name: [facility] [place] Address: [address] [place], [place] [address] Phone: [phone] Website: [email or web address] Service(s) Offered: Serves weekday meals, open from 11:30 am- 1:00 pm., and 6:30-7:30pm, Monday-Wednesday-Friday distributes food 3:30-6pm, Monday-Wednesday-Friday.  Agency Name: [facility] Address: [address] [place], [place] Phone: [phone] Website: [email or web address] Services Offered: Distributes food the 4th Saturday of the month, starting at 8:00 am  Agency Name: [facility] Address: [phone] [address] [place], [place] [address] Phone: [phone] Website: [email or web address] Service(s) Offered: Bread of life, weekly food pantry. Open Wednesdays from 10:00am-noon.  Agency Name: [facility] Address: [address] [place], [place], [place] Phone: [phone] Services Offered: Distributes food 9am-1pm, Monday-Thursday. Call for details.  Agency Name: [facility] Address: [address] [place], [place] [address] Phone: [phone] Website: [email or web address] Service(s) Offered: Games Developer. Call for assistance.  Agency Name: [facility] Address: [address] [place], [place] [address] Phone: [phone] Service Offered:  Emergency Food Pantry. Call for appointment.  Agency Name: [facility] Address: [address] [place], [place] [address] Phone: [phone] Website: [email or web address] Services Offered: Games Developer. Call for details  Agency Name: [facility] Address: [address] [place], [place] Phone: [phone] Website: [email or web address] Service(s) Offered: Emergency Food Pantry. Call for details.  Agency Name: Holiday Representative Address: [address]  [place], [place] [address] Phone: [phone] or [phone] Website: [email or web address] Service(s) Offered: Distribute food 9am-11:30 am, Tuesday-Friday, and 1-3:30pm, Monday-Friday. Food pantry Monday-Friday 1pm-3pm, fresh items, Mon.-Wed.-Fri.  Agency Name: [facility] Address: [address] [place], [place] [address] Phone: [phone] Website: [email or web address] Services Offered: Distribute food Tues and Sats from 9:00am-noon. Closed 1st Saturday of each month. Call for details  Agency Name: [employer] Address: Fayrene Boatman [facility] [address], [place] [address] Phone: [phone]  Services Offered: Delivers meals every Thursday   Rent/Utility/Housing  Agency Name: [facility] Agency Address: [address] [place], [place] [address] Phone: [phone] Email: [email or web address] Website: [email or web address] Service(s) Offered: Housing services, self-sufficiency, congregate meal program, weatherization program, field seismologist program, emergency food assistance,  housing counseling, home ownership program, wheels -towork program.  Agency Name: [facility] Address: [address] [place], [place] [address] Phone: [phone] (8a-4p) [phone] (8p- 10p) Email: [email or web address] Website: [email or web address] Service(s) Offered: A program for  homeless and/or needy men that includes one-on-one counseling, life skills training and job rehabilitation.  Agency Name: [facility] [place] Address: [address] [place] [address] [place], [place] [address] Phone: [phone] Website: www.[email or web address] Service(s) Offered: Assistance to needy in emergency with utility bills, heating fuel, and prescriptions. Shelter for homeless 7pm-7am. December 26, 2016 15  Agency Name: [facility] [place] (Developmentally Disabled) Address: [address], [place], [place] [address] Phone: [phone] Contact Person: Lemond Cart Email: [email or web address] Website: [email or web address] Service(s) Offered: Helps individuals with developmental disabilities move from housing that is more restrictive to homes where they  can achieve greater independence and have more  opportunities.  Agency Name: [facility] Address: [address], [place], [place] [address] Phone: [phone] Email: [email or web address] Website: [email or web address].org Service(s) Offered: Provides affordable housing for low-income families, elderly, and disabled individuals. Offer a wide range of  programs and services, from financial planning to afterschool and summer programs.  Agency Name: Department of Social Services Address: [address] [place], [place] [address] Phone: [phone] Service(s) Offered: Child support services; child welfare services; food stamps; Medicaid; work first family assistance; and aid with fuel,  rent, food and medicine.  Agency Name: [facility] [place] [employer]. Address: [facility] [address], [place], [place]  [address] Phone: [phone] Website: [email or web address].familyabuseservices.org Service(s) Offered: 24 hour Crisis Line: [phone]; 24 hour Emergency Shelter; Transitional Housing; Support Groups; Scientist, Physiological; [employer]; Hispanic Outreach: [phone];  Visitation Center:  [phone].  Agency Name: [facility] [place]. Address: [address] [place], [place] [address] Phone: [phone] Service(s) Offered: CAP Services; Home and [employer]; Individual or Group Supports; Respite Care Non-Institutional Nursing;  Residential Supports; Respite Care and Personal Care Services; Transportation; Family and Friends Night; Recreational Activities; Three Nutritious Meals/Snacks; Consultation with Registered Dietician; Twenty-four hour Registered Nurse Access; Daily and [employer]; Camp Green Leaves; Summer Shade for the [employer] (During Summer Months) Bingo Night (Every  Wednesday Night); Special Populations Dance Night  (Every Tuesday Night); Professional Hair Care Services.  Agency Name: [facility] Address: [address], [place], [place] [address] Phone: [phone] Contact Person: Me[place] High Website: [email or web address], Medical) Offered: Residential treatment facility for women; food and  clothing, educational & employment development and  transportation to work; counsellor of financial skills;  parenting and family reunification; emotional and spiritual  support; transitional housing for program graduates.  Agency Name: [facility] Address: [address], [place], [place] [address] Phone: [phone]  Email: [email or web address] Website: [email or web address] Service(s) Offered: Public housing units for elderly, disabled, and low income people; housing choice vouchers for income eligible  applicants; shelter plus care vouchers; and Psychologist, Clinical.  Agency Name: [facility] [employer] Address: [address] [place], [place] [address] Phone: [phone] Email: [email or web address] Website: www.habitatalamance.org Service(s) Offered: Build houses for families in need of decent housing. Each adult in the family must invest 200 hours  of labor on  someone elses house, work with volunteers to build their own house, attend classes on budgeting, home maintenance, yard care, and attend homeowner association meetings.  Agency Name: [facility] Address: [address] [place], [place] [address] Phone: [phone] Website: www.rsli.org Service(s) Offered: Intermediate care facilities for intellectually delayed, Supervised Living in group homes for adults with developmental disabilities, Supervised Living for people who have dual diagnoses (MRMI), Independent Living, Supported Living, respite and a variety of CAP services, pre-vocational services, day supports, and [employer].  Agency Name: [place] [facility] Phone: [phone] Website: www.NCForeclosurePrevention.gov Service(s) Offered: Zero-interest, deferred loans to homeowners struggling to pay their mortgage. Call for more information.     Transportation Resources for [employer]  Agency Name: [facility] Agency Address: [address] [place], [place] [address] Phone: [phone] Email: [email or web address] Website: www.[email or web address] Service(s) Offered: Housing services, self-sufficiency, congregate meal program, weatherization program, field seismologist program, emergency food assistance,  housing counseling, home ownership program, wheels-towork program.  Agency Name: [facility] [id number]) Address: [address], [place], [place] [address] Phone: [phone] Website: [email or web address].acta-[email or web address] Service(s) Offered: Transportation for bluelinx, subscription and demand response; Dial -a-Ride for citizens 56 years of age or older.  Agency Name: Department of Social Services Address: [address] [place], [place] [address] Phone: [phone] Service(s) Offered: Child support services; child welfare services; food stamps;  Medicaid; work first family assistance; and aid with fuel,  rent, food and medicine, transportation assistance.  Agency Name: Disabled [employer] (DAV) Transportation  Network Phone: [phone] Service(s) Offered: Transports veterans to the [facility] medical center. Call  forty-eight hours in advance and leave the name, telephone  number, date, and time of appointment. Veteran will be  contacted by the driver the day before the appointment to  arrange a pick up point    [employer] ACTA currently provides door to door services. ACTA connects with PART daily for services to [facility]. ACTA also performs contract services to [employer] operates 27 vehicles, all but 3 mini-vans are equipped with lifts for special needs as well as the general public. ACTA drivers are each CDL certified and trained in First Aid and CPR. ACTA was established in 2002 by [employer]. An independent Industrial/product Designer. ACTA operates via cytogeneticist with required local 10% match funding from [place]. ACTA provides over 80,000 passenger trips each year, including [facility] and [employer] sites.  Call at least by 11 AM one business day prior to needing transportation  [employer].                      [place], [place] [address]     Office Hours: Monday-Friday  8 AM - 5 PM   Agency Name: [facility] Address: [address] [place], [place] [address] Phone: [phone] Website: www.alamanceservices.org Service(s) Offered: Housing services, self-sufficiency, congregate meal program, and individual  development account program.  Agency Name: [facility] [place] Address: [address], [place], [place] [address] Phone: [phone] Email: [email or web address] Website: www.[email or web address] Service(s) Offered:  Housing the homeless, feeding the hungry, company secretary, job and education related services.  Agency Name: [facility] Address: [address] [place], [place] [address] Phone: [phone] Email: [email or web address] Service(s) Offered: Counseling, problem pregnancy, advocacy for Hispanics, limited emergency financial assistance.  Agency Name: Department of Social Services Address: [address] [place], [place] [address] Phone: [phone] Website: [email or web address] Service(s) Offered: Child support services; child welfare services; SNAP; Medicaid; work first family assistance; and aid with fuel,  rent, food and medicine.  Agency Name: [facility] Address: [address], [place], [place] [address] Phone: [phone] or [phone] Email: [email or web address] Service(s) Offered: Family services and transient assistance; emergency food, fuel, clothing, limited furniture, utilities; budget counseling, general counseling; give a kid a coat; thrift store; Christmas food and toys. Utility assistance, food pantry, rental  assistance, life sustaining medicine

## 2024-09-26 LAB — CULTURE, BLOOD (ROUTINE X 2)
Culture: NO GROWTH
Culture: NO GROWTH
Special Requests: ADEQUATE
